# Patient Record
Sex: Male | Born: 1945 | Race: Black or African American | Hispanic: No | Marital: Married | Smoking: Former smoker
Health system: Southern US, Community
[De-identification: ages and names within clinical notes are randomized; demographics above are authoritative.]

## PROBLEM LIST (undated history)

## (undated) DIAGNOSIS — N400 Enlarged prostate without lower urinary tract symptoms: Secondary | ICD-10-CM

## (undated) DIAGNOSIS — N1831 Chronic kidney disease, stage 3a: Secondary | ICD-10-CM

## (undated) DIAGNOSIS — I639 Cerebral infarction, unspecified: Secondary | ICD-10-CM

## (undated) DIAGNOSIS — E119 Type 2 diabetes mellitus without complications: Secondary | ICD-10-CM

## (undated) DIAGNOSIS — N529 Male erectile dysfunction, unspecified: Secondary | ICD-10-CM

## (undated) DIAGNOSIS — E785 Hyperlipidemia, unspecified: Secondary | ICD-10-CM

## (undated) DIAGNOSIS — K625 Hemorrhage of anus and rectum: Secondary | ICD-10-CM

## (undated) DIAGNOSIS — C61 Malignant neoplasm of prostate: Secondary | ICD-10-CM

## (undated) DIAGNOSIS — I10 Essential (primary) hypertension: Secondary | ICD-10-CM

## (undated) DIAGNOSIS — I2510 Atherosclerotic heart disease of native coronary artery without angina pectoris: Secondary | ICD-10-CM

## (undated) DIAGNOSIS — Z8719 Personal history of other diseases of the digestive system: Secondary | ICD-10-CM

## (undated) DIAGNOSIS — K219 Gastro-esophageal reflux disease without esophagitis: Secondary | ICD-10-CM

## (undated) DIAGNOSIS — F1011 Alcohol abuse, in remission: Secondary | ICD-10-CM

## (undated) DIAGNOSIS — M1990 Unspecified osteoarthritis, unspecified site: Secondary | ICD-10-CM

## (undated) HISTORY — DX: Malignant neoplasm of prostate: C61

## (undated) HISTORY — PX: CERVICAL DISC SURGERY: SHX588

## (undated) HISTORY — DX: Alcohol abuse, in remission: F10.11

## (undated) HISTORY — DX: Gastro-esophageal reflux disease without esophagitis: K21.9

---

## 1979-06-15 HISTORY — PX: LACERATION REPAIR: SHX5168

## 1979-06-15 HISTORY — PX: FOOT SURGERY: SHX648

## 1994-10-14 HISTORY — PX: CORONARY ARTERY BYPASS GRAFT: SHX141

## 1998-07-04 ENCOUNTER — Ambulatory Visit (HOSPITAL_COMMUNITY): Admission: RE | Admit: 1998-07-04 | Discharge: 1998-07-04 | Payer: Self-pay | Admitting: Cardiology

## 1999-10-17 ENCOUNTER — Emergency Department (HOSPITAL_COMMUNITY): Admission: EM | Admit: 1999-10-17 | Discharge: 1999-10-17 | Payer: Self-pay | Admitting: Emergency Medicine

## 1999-10-18 ENCOUNTER — Encounter: Payer: Self-pay | Admitting: Emergency Medicine

## 2000-01-26 ENCOUNTER — Inpatient Hospital Stay (HOSPITAL_COMMUNITY): Admission: EM | Admit: 2000-01-26 | Discharge: 2000-01-29 | Payer: Self-pay | Admitting: Emergency Medicine

## 2000-01-26 ENCOUNTER — Encounter: Payer: Self-pay | Admitting: Emergency Medicine

## 2002-10-13 ENCOUNTER — Ambulatory Visit (HOSPITAL_COMMUNITY): Admission: RE | Admit: 2002-10-13 | Discharge: 2002-10-13 | Payer: Self-pay | Admitting: Gastroenterology

## 2003-04-02 ENCOUNTER — Emergency Department (HOSPITAL_COMMUNITY): Admission: EM | Admit: 2003-04-02 | Discharge: 2003-04-02 | Payer: Self-pay | Admitting: Emergency Medicine

## 2003-04-02 ENCOUNTER — Encounter: Payer: Self-pay | Admitting: Emergency Medicine

## 2003-11-07 ENCOUNTER — Inpatient Hospital Stay (HOSPITAL_COMMUNITY): Admission: EM | Admit: 2003-11-07 | Discharge: 2003-11-08 | Payer: Self-pay | Admitting: Emergency Medicine

## 2004-11-23 ENCOUNTER — Observation Stay (HOSPITAL_COMMUNITY): Admission: EM | Admit: 2004-11-23 | Discharge: 2004-11-23 | Payer: Self-pay | Admitting: Emergency Medicine

## 2005-01-04 ENCOUNTER — Emergency Department (HOSPITAL_COMMUNITY): Admission: EM | Admit: 2005-01-04 | Discharge: 2005-01-04 | Payer: Self-pay | Admitting: Family Medicine

## 2005-01-06 ENCOUNTER — Emergency Department (HOSPITAL_COMMUNITY): Admission: EM | Admit: 2005-01-06 | Discharge: 2005-01-06 | Payer: Self-pay | Admitting: Emergency Medicine

## 2005-01-11 ENCOUNTER — Emergency Department (HOSPITAL_COMMUNITY): Admission: EM | Admit: 2005-01-11 | Discharge: 2005-01-11 | Payer: Self-pay | Admitting: Emergency Medicine

## 2005-02-04 ENCOUNTER — Inpatient Hospital Stay (HOSPITAL_COMMUNITY): Admission: RE | Admit: 2005-02-04 | Discharge: 2005-02-05 | Payer: Self-pay | Admitting: Neurological Surgery

## 2005-04-09 ENCOUNTER — Ambulatory Visit (HOSPITAL_COMMUNITY): Admission: RE | Admit: 2005-04-09 | Discharge: 2005-04-09 | Payer: Self-pay | Admitting: Neurological Surgery

## 2005-04-17 ENCOUNTER — Emergency Department (HOSPITAL_COMMUNITY): Admission: EM | Admit: 2005-04-17 | Discharge: 2005-04-17 | Payer: Self-pay | Admitting: Emergency Medicine

## 2005-04-24 ENCOUNTER — Emergency Department (HOSPITAL_COMMUNITY): Admission: EM | Admit: 2005-04-24 | Discharge: 2005-04-24 | Payer: Self-pay | Admitting: Emergency Medicine

## 2005-10-07 ENCOUNTER — Inpatient Hospital Stay (HOSPITAL_COMMUNITY): Admission: EM | Admit: 2005-10-07 | Discharge: 2005-10-10 | Payer: Self-pay | Admitting: Emergency Medicine

## 2006-02-12 ENCOUNTER — Encounter: Payer: Self-pay | Admitting: Cardiology

## 2006-10-29 ENCOUNTER — Emergency Department (HOSPITAL_COMMUNITY): Admission: EM | Admit: 2006-10-29 | Discharge: 2006-10-29 | Payer: Self-pay | Admitting: Emergency Medicine

## 2007-02-18 ENCOUNTER — Emergency Department (HOSPITAL_COMMUNITY): Admission: EM | Admit: 2007-02-18 | Discharge: 2007-02-18 | Payer: Self-pay | Admitting: Emergency Medicine

## 2007-05-25 ENCOUNTER — Emergency Department (HOSPITAL_COMMUNITY): Admission: EM | Admit: 2007-05-25 | Discharge: 2007-05-25 | Payer: Self-pay | Admitting: Emergency Medicine

## 2007-06-09 ENCOUNTER — Encounter: Admission: RE | Admit: 2007-06-09 | Discharge: 2007-06-09 | Payer: Self-pay | Admitting: Internal Medicine

## 2007-10-03 ENCOUNTER — Emergency Department (HOSPITAL_COMMUNITY): Admission: EM | Admit: 2007-10-03 | Discharge: 2007-10-03 | Payer: Self-pay | Admitting: Emergency Medicine

## 2007-11-29 ENCOUNTER — Emergency Department (HOSPITAL_COMMUNITY): Admission: EM | Admit: 2007-11-29 | Discharge: 2007-11-29 | Payer: Self-pay | Admitting: Emergency Medicine

## 2008-10-03 ENCOUNTER — Ambulatory Visit (HOSPITAL_COMMUNITY): Admission: RE | Admit: 2008-10-03 | Discharge: 2008-10-03 | Payer: Self-pay | Admitting: Urology

## 2008-10-14 LAB — HM COLONOSCOPY

## 2008-10-19 ENCOUNTER — Ambulatory Visit: Admission: RE | Admit: 2008-10-19 | Discharge: 2009-01-17 | Payer: Self-pay | Admitting: Radiation Oncology

## 2009-01-18 ENCOUNTER — Ambulatory Visit: Admission: RE | Admit: 2009-01-18 | Discharge: 2009-04-07 | Payer: Self-pay | Admitting: Radiation Oncology

## 2009-01-26 ENCOUNTER — Ambulatory Visit: Payer: Self-pay | Admitting: Cardiology

## 2009-01-27 ENCOUNTER — Observation Stay (HOSPITAL_COMMUNITY): Admission: EM | Admit: 2009-01-27 | Discharge: 2009-01-27 | Payer: Self-pay | Admitting: Emergency Medicine

## 2009-01-27 HISTORY — PX: CARDIAC CATHETERIZATION: SHX172

## 2009-04-10 ENCOUNTER — Emergency Department (HOSPITAL_COMMUNITY): Admission: EM | Admit: 2009-04-10 | Discharge: 2009-04-10 | Payer: Self-pay | Admitting: Emergency Medicine

## 2009-10-08 ENCOUNTER — Emergency Department (HOSPITAL_COMMUNITY): Admission: EM | Admit: 2009-10-08 | Discharge: 2009-10-08 | Payer: Self-pay | Admitting: Emergency Medicine

## 2009-10-28 ENCOUNTER — Emergency Department (HOSPITAL_COMMUNITY): Admission: EM | Admit: 2009-10-28 | Discharge: 2009-10-28 | Payer: Self-pay | Admitting: Emergency Medicine

## 2009-11-06 ENCOUNTER — Ambulatory Visit (HOSPITAL_BASED_OUTPATIENT_CLINIC_OR_DEPARTMENT_OTHER): Admission: RE | Admit: 2009-11-06 | Discharge: 2009-11-06 | Payer: Self-pay | Admitting: Urology

## 2010-01-30 ENCOUNTER — Observation Stay (HOSPITAL_COMMUNITY): Admission: EM | Admit: 2010-01-30 | Discharge: 2010-01-31 | Payer: Self-pay | Admitting: Emergency Medicine

## 2010-05-16 ENCOUNTER — Emergency Department (HOSPITAL_COMMUNITY): Admission: EM | Admit: 2010-05-16 | Discharge: 2010-05-16 | Payer: Self-pay | Admitting: Emergency Medicine

## 2010-05-16 ENCOUNTER — Ambulatory Visit: Payer: Self-pay | Admitting: Cardiology

## 2010-05-20 ENCOUNTER — Emergency Department (HOSPITAL_COMMUNITY): Admission: EM | Admit: 2010-05-20 | Discharge: 2010-05-20 | Payer: Self-pay | Admitting: Internal Medicine

## 2010-11-23 ENCOUNTER — Other Ambulatory Visit: Payer: Self-pay | Admitting: Cardiology

## 2010-11-23 ENCOUNTER — Ambulatory Visit (INDEPENDENT_AMBULATORY_CARE_PROVIDER_SITE_OTHER): Payer: BC Managed Care – PPO | Admitting: Cardiology

## 2010-11-23 DIAGNOSIS — E119 Type 2 diabetes mellitus without complications: Secondary | ICD-10-CM

## 2010-11-23 DIAGNOSIS — I2510 Atherosclerotic heart disease of native coronary artery without angina pectoris: Secondary | ICD-10-CM

## 2010-11-23 DIAGNOSIS — R911 Solitary pulmonary nodule: Secondary | ICD-10-CM

## 2010-11-23 DIAGNOSIS — I10 Essential (primary) hypertension: Secondary | ICD-10-CM

## 2010-11-23 DIAGNOSIS — R918 Other nonspecific abnormal finding of lung field: Secondary | ICD-10-CM

## 2010-11-27 ENCOUNTER — Other Ambulatory Visit: Payer: BC Managed Care – PPO

## 2010-11-27 ENCOUNTER — Ambulatory Visit
Admission: RE | Admit: 2010-11-27 | Discharge: 2010-11-27 | Disposition: A | Discharge status: Home / Self Care | Payer: BC Managed Care – PPO | Source: Ambulatory Visit | Attending: Cardiology | Admitting: Cardiology

## 2010-11-27 DIAGNOSIS — R918 Other nonspecific abnormal finding of lung field: Secondary | ICD-10-CM

## 2010-12-17 ENCOUNTER — Other Ambulatory Visit: Payer: BC Managed Care – PPO

## 2010-12-17 ENCOUNTER — Other Ambulatory Visit: Payer: Self-pay | Admitting: Endocrinology

## 2010-12-17 ENCOUNTER — Ambulatory Visit (INDEPENDENT_AMBULATORY_CARE_PROVIDER_SITE_OTHER): Payer: BC Managed Care – PPO | Admitting: Endocrinology

## 2010-12-17 ENCOUNTER — Encounter: Payer: Self-pay | Admitting: Endocrinology

## 2010-12-17 DIAGNOSIS — F1011 Alcohol abuse, in remission: Secondary | ICD-10-CM | POA: Insufficient documentation

## 2010-12-17 DIAGNOSIS — E1159 Type 2 diabetes mellitus with other circulatory complications: Secondary | ICD-10-CM | POA: Insufficient documentation

## 2010-12-17 DIAGNOSIS — E7800 Pure hypercholesterolemia, unspecified: Secondary | ICD-10-CM

## 2010-12-17 DIAGNOSIS — E119 Type 2 diabetes mellitus without complications: Secondary | ICD-10-CM

## 2010-12-17 DIAGNOSIS — N401 Enlarged prostate with lower urinary tract symptoms: Secondary | ICD-10-CM | POA: Insufficient documentation

## 2010-12-17 DIAGNOSIS — Z79899 Other long term (current) drug therapy: Secondary | ICD-10-CM

## 2010-12-17 DIAGNOSIS — N400 Enlarged prostate without lower urinary tract symptoms: Secondary | ICD-10-CM | POA: Diagnosis present

## 2010-12-17 DIAGNOSIS — I10 Essential (primary) hypertension: Secondary | ICD-10-CM | POA: Insufficient documentation

## 2010-12-17 DIAGNOSIS — E1169 Type 2 diabetes mellitus with other specified complication: Secondary | ICD-10-CM | POA: Insufficient documentation

## 2010-12-17 DIAGNOSIS — E785 Hyperlipidemia, unspecified: Secondary | ICD-10-CM | POA: Insufficient documentation

## 2010-12-17 LAB — URINALYSIS, ROUTINE W REFLEX MICROSCOPIC
Bilirubin Urine: NEGATIVE
Hgb urine dipstick: NEGATIVE
Leukocytes, UA: NEGATIVE
Nitrite: NEGATIVE
Urobilinogen, UA: 0.2 (ref 0.0–1.0)

## 2010-12-17 LAB — LIPID PANEL
LDL Cholesterol: 52 mg/dL (ref 0–99)
Total CHOL/HDL Ratio: 4
Triglycerides: 164 mg/dL — ABNORMAL HIGH (ref 0.0–149.0)

## 2010-12-17 LAB — BASIC METABOLIC PANEL
Calcium: 8.8 mg/dL (ref 8.4–10.5)
Chloride: 101 mEq/L (ref 96–112)
Creatinine, Ser: 1.4 mg/dL (ref 0.4–1.5)
Sodium: 138 mEq/L (ref 135–145)

## 2010-12-17 LAB — HEPATIC FUNCTION PANEL
ALT: 17 U/L (ref 0–53)
Bilirubin, Direct: 0.1 mg/dL (ref 0.0–0.3)
Total Bilirubin: 0.2 mg/dL — ABNORMAL LOW (ref 0.3–1.2)

## 2010-12-17 LAB — MICROALBUMIN / CREATININE URINE RATIO
Microalb Creat Ratio: 2.9 mg/g (ref 0.0–30.0)
Microalb, Ur: 3.1 mg/dL — ABNORMAL HIGH (ref 0.0–1.9)

## 2010-12-17 LAB — HEMOGLOBIN A1C: Hgb A1c MFr Bld: 8.2 % — ABNORMAL HIGH (ref 4.6–6.5)

## 2010-12-25 NOTE — Assessment & Plan Note (Signed)
Summary: NEW ENDO CONSULT/ BCBS / SELF REFERRAL / DM / PCP IS DR. Nadara Eaton...   Vital Signs:  Patient profile:   65 year old male Height:      67.5 inches (171.45 cm) Weight:      192.50 pounds (87.50 kg) BMI:     29.81 O2 Sat:      94 % on Room air Temp:     98.9 degrees F (37.17 degrees C) oral Pulse rate:   71 / minute Pulse rhythm:   regular BP sitting:   118 / 86  (left arm) Cuff size:   large  Vitals Entered By: Brenton Grills CMA Duncan Dull) (December 17, 2010 2:59 PM)  O2 Flow:  Room air CC: New Endo Consult/DM/self-referral Comments Pt is due for tetanus and has never had Zostavax or Pneumovax   Referring Provider:  self Primary Provider:  Billee Cashing MD  CC:  New Endo Consult/DM/self-referral.  History of Present Illness: pt states was dx'ed with dm in 2011, when he presented for urologic surgery.  he is unaware of any chronic complications, but he had cabg in the 1990's.   it is complicated by .  he has never , except in the hospital been on insulin.   he takes 2 oral meds (he stopped actos due to negative media reports).  pt states cbg's are mostly in the high-100's.   pt says his diet and exercise are "not good."   symptomatically, pt states 1 month of moderate dryness of the mouth, and assoc polyuria.   Current Medications (verified): 1)  Metformin Hcl 500 Mg Xr24h-Tab (Metformin Hcl) .... 2 Tablets By Mouth Two Times A Day 2)  Finasteride 5 Mg Tabs (Finasteride) .Marland Kitchen.. 1 Tablet By Mouth Once Daily 3)  Metoprolol Tartrate 50 Mg Tabs (Metoprolol Tartrate) .Marland Kitchen.. 1 Tablet By Mouth Two Times A Day 4)  Accu-Chek Aviva  Strp (Glucose Blood) .... Test 1 To 2 Times A Day 5)  Glucotrol Xl 10 Mg Xr24h-Tab (Glipizide) .Marland Kitchen.. 1 Tablet By Mouth Once Daily 6)  Actos 45 Mg Tabs (Pioglitazone Hcl) .Marland Kitchen.. 1 Tablet By Mouth Once Daily  Allergies (verified): 1)  ! Demerol (Meperidine Hcl)  Past History:  Past Medical History: ALCOHOL ABUSE, IN REMISSION  (ICD-305.03) HYPERCHOLESTEROLEMIA (ICD-272.0) HYPERTENSION (ICD-401.9) BENIGN PROSTATIC HYPERTROPHY, WITH OBSTRUCTION (ICD-600.01) CAD (ICD-414.00) DM (ICD-250.00)  Family History: Reviewed history and no changes required. Family History Diabetes (2 sibs, 1 of whom is deceased) Family History Hypertension Family History Lung cancer Family History of Prostate CA 1st degree relative <50  Social History: Reviewed history and no changes required. 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 Smoking Status:  quit Drug Use:  no Does Patient Exercise:  yes Seat Belt Use:  yes  Review of Systems       The patient complains of weight gain.         denies blurry vision, headache, chest pain, sob, n/v, excessive diaphoresis, depression, hypoglycemia, rhinorrhea, and easy bruising.  he has muscle cramps and memory loss.     Physical Exam  General:  normal appearance.   Head:  head: no deformity eyes: no periorbital swelling, no proptosis external nose and ears are normal mouth: no lesion seen Neck:  Supple without thyroid enlargement or tenderness.  Lungs:  Clear to auscultation bilaterally. Normal respiratory effort.  Heart:  Regular rate and rhythm without murmurs or gallops noted. Normal S1,S2.   Abdomen:  abdomen is soft,  nontender.  no hepatosplenomegaly.   not distended.  no hernia  Msk:  muscle bulk and strength are grossly normal.  no obvious joint swelling.  gait is normal and steady  Pulses:  dorsalis pedis intact bilat.  no carotid bruit  Extremities:  no deformity.  no ulcer on the feet.  feet are of normal color and temp.  trace right pedal edema and trace left pedal edema.   Neurologic:  cn 2-12 grossly intact.   readily moves all 4's.   sensation is intact to touch on the feet  Skin:  normal texture and temp.  no rash.  not diaphoretic  Cervical Nodes:  No significant adenopathy.  Psych:   Alert and cooperative; normal mood and affect; normal attention span and concentration.   Additional Exam:   Hemoglobin A1C       [H]  8.2 %   LDL Cholesterol           52 mg/dL      Impression & Recommendations:  Problem # 1:  DM (ICD-250.00) needs increased rx  Problem # 2:  dryness of the mouth not related to #1  Problem # 3:  CAD (ICD-414.00) he needs to avoid hypoglycemia  Problem # 4:  weight gain this complicates the rx of #1  Problem # 5:  HYPERCHOLESTEROLEMIA (ICD-272.0) well-controlled  Medications Added to Medication List This Visit: 1)  Metformin Hcl 500 Mg Xr24h-tab (Metformin hcl) .... 2 tablets by mouth two times a day 2)  Finasteride 5 Mg Tabs (Finasteride) .Marland Kitchen.. 1 tablet by mouth once daily 3)  Metoprolol Tartrate 50 Mg Tabs (Metoprolol tartrate) .Marland Kitchen.. 1 tablet by mouth two times a day 4)  Accu-chek Aviva Strp (Glucose blood) .... Test 1 to 2 times a day 5)  Glucotrol Xl 10 Mg Xr24h-tab (Glipizide) .Marland Kitchen.. 1 tablet by mouth once daily 6)  Actos 45 Mg Tabs (Pioglitazone hcl) .Marland Kitchen.. 1 tablet by mouth once daily  Other Orders: TLB-Lipid Panel (80061-LIPID) TLB-BMP (Basic Metabolic Panel-BMET) (80048-METABOL) TLB-Hepatic/Liver Function Pnl (80076-HEPATIC) TLB-TSH (Thyroid Stimulating Hormone) (84443-TSH) TLB-A1C / Hgb A1C (Glycohemoglobin) (83036-A1C) TLB-Microalbumin/Creat Ratio, Urine (82043-MALB) TLB-Udip w/ Micro (81001-URINE) New Patient Level IV (38756)  Patient Instructions: 1)  good diet and exercise habits significanly improve the control of your diabetes.  please let me know if you wish to be referred to a dietician.  high blood sugar is very risky to your health.  you should see an eye doctor every year. 2)  controlling your blood pressure and cholesterol drastically reduces the damage diabetes does to your body.  this also applies to quitting smoking.  please discuss these with your doctor.  you should take an aspirin every day, unless you have been advised  by a doctor not to. 3)  check your blood sugar 1 time a day.  vary the time of day when you check, between before the 3 meals, and at bedtime.  also check if you have symptoms of your blood sugar being too high or too low.  please keep a record of the readings and bring it to your next appointment here.  please call us sooner if you are having low blood sugar episodes. 4)  blood tests are being ordered for you today.  please call [phone] to hear your test results. 5)  cc drs dahlstedt, [place] 6)  Please schedule a follow-up appointment in 3 months. 7)  (update: i left message on phone-tree:  you should resume actos 45 mg once daily.  call if  you want to take a different med instead).   Orders Added: 1)  TLB-Lipid Panel [80061-LIPID] 2)  TLB-BMP (Basic Metabolic Panel-BMET) [80048-METABOL] 3)  TLB-Hepatic/Liver Function Pnl [80076-HEPATIC] 4)  TLB-TSH (Thyroid Stimulating Hormone) [84443-TSH] 5)  TLB-A1C / Hgb A1C (Glycohemoglobin) [83036-A1C] 6)  TLB-Microalbumin/Creat Ratio, Urine [82043-MALB] 7)  TLB-Udip w/ Micro [81001-URINE] 8)  New Patient Level IV [09811]   Immunization History:  Influenza Immunization History:    Influenza:  historical (08/14/2010)   Immunization History:  Influenza Immunization History:    Influenza:  Historical (08/14/2010)  Preventive Care Screening  Colonoscopy:    Date:  10/14/2008    Results:  done

## 2010-12-28 LAB — DIFFERENTIAL
Basophils Absolute: 0 10*3/uL (ref 0.0–0.1)
Basophils Relative: 0 % (ref 0–1)
Basophils Relative: 1 % (ref 0–1)
Eosinophils Absolute: 0.1 10*3/uL (ref 0.0–0.7)
Lymphs Abs: 1.6 10*3/uL (ref 0.7–4.0)
Monocytes Absolute: 0.3 10*3/uL (ref 0.1–1.0)
Monocytes Absolute: 0.4 10*3/uL (ref 0.1–1.0)
Monocytes Relative: 6 % (ref 3–12)
Neutro Abs: 2.4 10*3/uL (ref 1.7–7.7)
Neutrophils Relative %: 55 % (ref 43–77)

## 2010-12-28 LAB — COMPREHENSIVE METABOLIC PANEL
ALT: 26 U/L (ref 0–53)
Albumin: 4 g/dL (ref 3.5–5.2)
Alkaline Phosphatase: 80 U/L (ref 39–117)
Calcium: 9.5 mg/dL (ref 8.4–10.5)
GFR calc Af Amer: >60 mL/min (ref >60)
Potassium: 4.7 mEq/L (ref 3.5–5.1)
Sodium: 137 mEq/L (ref 135–145)
Total Protein: 7.2 g/dL (ref 6.0–8.3)

## 2010-12-28 LAB — CBC
HCT: 35.4 % — ABNORMAL LOW (ref 39.0–52.0)
HCT: 40.4 % (ref 39.0–52.0)
MCHC: 32.8 g/dL (ref 30.0–36.0)
Platelets: 147 10*3/uL — ABNORMAL LOW (ref 150–400)
RDW: 16.4 % — ABNORMAL HIGH (ref 11.5–15.5)
RDW: 16.8 % — ABNORMAL HIGH (ref 11.5–15.5)
WBC: 5.7 10*3/uL (ref 4.0–10.5)

## 2010-12-28 LAB — POCT CARDIAC MARKERS
CKMB, poc: <1 ng/mL — ABNORMAL LOW (ref 1.0–8.0)
CKMB, poc: <1 ng/mL — ABNORMAL LOW (ref 1.0–8.0)
CKMB, poc: <1 ng/mL — ABNORMAL LOW (ref 1.0–8.0)
Myoglobin, poc: 41.7 ng/mL (ref 12–200)
Myoglobin, poc: 67.5 ng/mL (ref 12–200)

## 2010-12-28 LAB — BASIC METABOLIC PANEL
BUN: 13 mg/dL (ref 6–23)
Calcium: 8.6 mg/dL (ref 8.4–10.5)
GFR calc non Af Amer: 56 mL/min — ABNORMAL LOW (ref >60)
Glucose, Bld: 126 mg/dL — ABNORMAL HIGH (ref 70–99)

## 2010-12-28 LAB — URINALYSIS, ROUTINE W REFLEX MICROSCOPIC
Hgb urine dipstick: NEGATIVE
Nitrite: NEGATIVE
Specific Gravity, Urine: 1.018 (ref 1.005–1.030)
Urobilinogen, UA: 0.2 mg/dL (ref 0.0–1.0)

## 2010-12-28 LAB — PROTIME-INR: INR: 0.92 (ref 0.00–1.49)

## 2010-12-28 LAB — HEMOCCULT GUIAC POC 1CARD (OFFICE): Fecal Occult Bld: NEGATIVE

## 2010-12-30 LAB — URINALYSIS, ROUTINE W REFLEX MICROSCOPIC
Protein, ur: >300 mg/dL — AB
Specific Gravity, Urine: 1.027 (ref 1.005–1.030)
Urobilinogen, UA: 1 mg/dL (ref 0.0–1.0)

## 2010-12-30 LAB — DIFFERENTIAL
Eosinophils Absolute: 0.1 10*3/uL (ref 0.0–0.7)
Eosinophils Relative: 1 % (ref 0–5)
Lymphocytes Relative: 13 % (ref 12–46)
Lymphs Abs: 0.8 10*3/uL (ref 0.7–4.0)
Monocytes Absolute: 0.2 10*3/uL (ref 0.1–1.0)
Monocytes Relative: 4 % (ref 3–12)

## 2010-12-30 LAB — POCT I-STAT, CHEM 8
BUN: 28 mg/dL — ABNORMAL HIGH (ref 6–23)
Creatinine, Ser: 1.7 mg/dL — ABNORMAL HIGH (ref 0.4–1.5)
Glucose, Bld: 380 mg/dL — ABNORMAL HIGH (ref 70–99)
Hemoglobin: 13.9 g/dL (ref 13.0–17.0)
TCO2: 23 mmol/L (ref 0–100)

## 2010-12-30 LAB — POCT I-STAT 4, (NA,K, GLUC, HGB,HCT)
Glucose, Bld: 331 mg/dL — ABNORMAL HIGH (ref 70–99)
HCT: 39 % (ref 39.0–52.0)
Potassium: 3.9 mEq/L (ref 3.5–5.1)

## 2010-12-30 LAB — GLUCOSE, CAPILLARY
Glucose-Capillary: 365 mg/dL — ABNORMAL HIGH (ref 70–99)
Glucose-Capillary: 232 mg/dL — ABNORMAL HIGH (ref 70–99)

## 2010-12-30 LAB — CBC
HCT: 39.3 % (ref 39.0–52.0)
Hemoglobin: 13.5 g/dL (ref 13.0–17.0)
MCV: 87 fL (ref 78.0–100.0)
RBC: 4.52 MIL/uL (ref 4.22–5.81)
WBC: 6.2 10*3/uL (ref 4.0–10.5)

## 2010-12-30 LAB — URINE MICROSCOPIC-ADD ON

## 2011-01-01 LAB — URINE CULTURE
Colony Count: NO GROWTH
Culture: NO GROWTH

## 2011-01-01 LAB — BASIC METABOLIC PANEL
BUN: 20 mg/dL (ref 6–23)
CO2: 22 mEq/L (ref 19–32)
CO2: 25 mEq/L (ref 19–32)
Calcium: 9.1 mg/dL (ref 8.4–10.5)
Chloride: 107 mEq/L (ref 96–112)
Chloride: 110 mEq/L (ref 96–112)
Creatinine, Ser: 1.79 mg/dL — ABNORMAL HIGH (ref 0.4–1.5)
GFR calc Af Amer: 47 mL/min — ABNORMAL LOW (ref >60)
GFR calc Af Amer: >60 mL/min (ref >60)
GFR calc non Af Amer: 39 mL/min — ABNORMAL LOW (ref >60)
Glucose, Bld: 131 mg/dL — ABNORMAL HIGH (ref 70–99)
Potassium: 4 mEq/L (ref 3.5–5.1)
Potassium: 4.1 mEq/L (ref 3.5–5.1)
Sodium: 139 mEq/L (ref 135–145)
Sodium: 139 mEq/L (ref 135–145)

## 2011-01-01 LAB — CBC
HCT: 33.5 % — ABNORMAL LOW (ref 39.0–52.0)
Hemoglobin: 11.6 g/dL — ABNORMAL LOW (ref 13.0–17.0)
MCHC: 33.5 g/dL (ref 30.0–36.0)
MCHC: 34.4 g/dL (ref 30.0–36.0)
MCV: 88.6 fL (ref 78.0–100.0)
MCV: 90.2 fL (ref 78.0–100.0)
Platelets: 146 10*3/uL — ABNORMAL LOW (ref 150–400)
RBC: 3.79 MIL/uL — ABNORMAL LOW (ref 4.22–5.81)
RBC: 3.91 MIL/uL — ABNORMAL LOW (ref 4.22–5.81)
WBC: 4.5 10*3/uL (ref 4.0–10.5)

## 2011-01-01 LAB — URINALYSIS, ROUTINE W REFLEX MICROSCOPIC
Glucose, UA: NEGATIVE mg/dL
Ketones, ur: NEGATIVE mg/dL
Nitrite: NEGATIVE
Protein, ur: NEGATIVE mg/dL
Urobilinogen, UA: 0.2 mg/dL (ref 0.0–1.0)

## 2011-01-01 LAB — IRON AND TIBC
Iron: 39 ug/dL — ABNORMAL LOW (ref 42–135)
Saturation Ratios: 13 % — ABNORMAL LOW (ref 20–55)
TIBC: 311 ug/dL (ref 215–435)

## 2011-01-01 LAB — DIFFERENTIAL
Basophils Absolute: 0 10*3/uL (ref 0.0–0.1)
Basophils Relative: 0 % (ref 0–1)
Eosinophils Absolute: 0.2 10*3/uL (ref 0.0–0.7)
Eosinophils Relative: 5 % (ref 0–5)
Lymphocytes Relative: 24 % (ref 12–46)
Lymphs Abs: 1.1 10*3/uL (ref 0.7–4.0)
Monocytes Absolute: 0.3 10*3/uL (ref 0.1–1.0)
Monocytes Relative: 6 % (ref 3–12)
Neutro Abs: 2.9 10*3/uL (ref 1.7–7.7)
Neutrophils Relative %: 64 % (ref 43–77)

## 2011-01-01 LAB — TYPE AND SCREEN
ABO/RH(D): AB POS
Antibody Screen: NEGATIVE

## 2011-01-01 LAB — RETICULOCYTES: Retic Count, Absolute: 52 10*3/uL (ref 19.0–186.0)

## 2011-01-01 LAB — CREATININE, URINE, RANDOM: Creatinine, Urine: 96 mg/dL

## 2011-01-01 LAB — SODIUM, URINE, RANDOM: Sodium, Ur: 215 mEq/L

## 2011-01-01 LAB — PROTIME-INR
INR: 1.1 (ref 0.00–1.49)
Prothrombin Time: 14.1 seconds (ref 11.6–15.2)

## 2011-01-01 LAB — HEMOGLOBIN AND HEMATOCRIT, BLOOD
HCT: 34.4 % — ABNORMAL LOW (ref 39.0–52.0)
Hemoglobin: 11.9 g/dL — ABNORMAL LOW (ref 13.0–17.0)

## 2011-01-01 LAB — FERRITIN: Ferritin: 27 ng/mL (ref 22–322)

## 2011-01-01 LAB — ABO/RH: ABO/RH(D): AB POS

## 2011-01-01 LAB — HEMOCCULT GUIAC POC 1CARD (OFFICE): Fecal Occult Bld: POSITIVE

## 2011-01-14 LAB — CBC
HCT: 42.9 % (ref 39.0–52.0)
Hemoglobin: 14.5 g/dL (ref 13.0–17.0)
MCHC: 33.7 g/dL (ref 30.0–36.0)
RDW: 15.7 % — ABNORMAL HIGH (ref 11.5–15.5)

## 2011-01-14 LAB — BASIC METABOLIC PANEL
CO2: 29 mEq/L (ref 19–32)
Calcium: 9.2 mg/dL (ref 8.4–10.5)
Glucose, Bld: 213 mg/dL — ABNORMAL HIGH (ref 70–99)
Sodium: 137 mEq/L (ref 135–145)

## 2011-01-14 LAB — DIFFERENTIAL
Basophils Absolute: 0 10*3/uL (ref 0.0–0.1)
Basophils Relative: 0 % (ref 0–1)
Eosinophils Absolute: 0.2 10*3/uL (ref 0.0–0.7)
Eosinophils Relative: 4 % (ref 0–5)
Monocytes Absolute: 0.5 10*3/uL (ref 0.1–1.0)

## 2011-01-14 LAB — URINALYSIS, ROUTINE W REFLEX MICROSCOPIC
Bilirubin Urine: NEGATIVE
Glucose, UA: NEGATIVE mg/dL
Ketones, ur: NEGATIVE mg/dL
Leukocytes, UA: NEGATIVE
pH: 6 (ref 5.0–8.0)

## 2011-01-14 LAB — URINE MICROSCOPIC-ADD ON

## 2011-01-14 LAB — URINE CULTURE: Colony Count: NO GROWTH

## 2011-01-21 LAB — URINALYSIS, ROUTINE W REFLEX MICROSCOPIC
Bilirubin Urine: NEGATIVE
Ketones, ur: 15 mg/dL — AB
Nitrite: NEGATIVE
pH: 7.5 (ref 5.0–8.0)

## 2011-01-21 LAB — POCT I-STAT, CHEM 8
Calcium, Ion: 1.13 mmol/L (ref 1.12–1.32)
Chloride: 105 mEq/L (ref 96–112)
HCT: 38 % — ABNORMAL LOW (ref 39.0–52.0)
Hemoglobin: 12.9 g/dL — ABNORMAL LOW (ref 13.0–17.0)
Potassium: 4.5 mEq/L (ref 3.5–5.1)

## 2011-01-21 LAB — POCT CARDIAC MARKERS
CKMB, poc: <1 ng/mL — ABNORMAL LOW (ref 1.0–8.0)
Troponin i, poc: <0.05 ng/mL (ref 0.00–0.09)

## 2011-01-21 LAB — BRAIN NATRIURETIC PEPTIDE: Pro B Natriuretic peptide (BNP): 40 pg/mL (ref 0.0–100.0)

## 2011-01-21 LAB — DIFFERENTIAL
Basophils Absolute: 0 10*3/uL (ref 0.0–0.1)
Lymphocytes Relative: 10 % — ABNORMAL LOW (ref 12–46)
Neutro Abs: 4.6 10*3/uL (ref 1.7–7.7)

## 2011-01-21 LAB — CBC
Platelets: 97 10*3/uL — ABNORMAL LOW (ref 150–400)
RDW: 15.1 % (ref 11.5–15.5)

## 2011-01-21 LAB — URINE MICROSCOPIC-ADD ON

## 2011-01-23 LAB — COMPREHENSIVE METABOLIC PANEL
Albumin: 3.3 g/dL — ABNORMAL LOW (ref 3.5–5.2)
BUN: 15 mg/dL (ref 6–23)
Calcium: 8.5 mg/dL (ref 8.4–10.5)
Creatinine, Ser: 1.23 mg/dL (ref 0.4–1.5)
Glucose, Bld: 121 mg/dL — ABNORMAL HIGH (ref 70–99)
Potassium: 3.6 mEq/L (ref 3.5–5.1)
Total Protein: 5.7 g/dL — ABNORMAL LOW (ref 6.0–8.3)

## 2011-01-23 LAB — POCT CARDIAC MARKERS

## 2011-01-23 LAB — CARDIAC PANEL(CRET KIN+CKTOT+MB+TROPI)
CK, MB: 1.5 ng/mL (ref 0.3–4.0)
CK, MB: 1.8 ng/mL (ref 0.3–4.0)
Relative Index: 1.2 (ref 0.0–2.5)
Relative Index: 1.3 (ref 0.0–2.5)
Total CK: 130 U/L (ref 7–232)
Total CK: 136 U/L (ref 7–232)
Troponin I: <0.01 ng/mL (ref 0.00–0.06)

## 2011-01-23 LAB — CBC
HCT: 40.6 % (ref 39.0–52.0)
Platelets: 139 10*3/uL — ABNORMAL LOW (ref 150–400)
RDW: 14.9 % (ref 11.5–15.5)
WBC: 5.6 10*3/uL (ref 4.0–10.5)

## 2011-01-23 LAB — CK TOTAL AND CKMB (NOT AT ARMC)
CK, MB: 2.3 ng/mL (ref 0.3–4.0)
Relative Index: 1.4 (ref 0.0–2.5)
Total CK: 162 U/L (ref 7–232)

## 2011-01-23 LAB — BASIC METABOLIC PANEL
BUN: 16 mg/dL (ref 6–23)
Calcium: 9.6 mg/dL (ref 8.4–10.5)
Creatinine, Ser: 1.39 mg/dL (ref 0.4–1.5)
GFR calc non Af Amer: 52 mL/min — ABNORMAL LOW (ref >60)
Glucose, Bld: 122 mg/dL — ABNORMAL HIGH (ref 70–99)
Potassium: 4.2 mEq/L (ref 3.5–5.1)

## 2011-01-23 LAB — PROTIME-INR
INR: 1 (ref 0.00–1.49)
Prothrombin Time: 12.9 seconds (ref 11.6–15.2)

## 2011-01-23 LAB — DIFFERENTIAL
Basophils Absolute: 0 10*3/uL (ref 0.0–0.1)
Eosinophils Absolute: 0.2 10*3/uL (ref 0.0–0.7)
Eosinophils Relative: 3 % (ref 0–5)
Lymphocytes Relative: 45 % (ref 12–46)
Lymphs Abs: 2.5 10*3/uL (ref 0.7–4.0)
Neutrophils Relative %: 44 % (ref 43–77)

## 2011-01-23 LAB — BRAIN NATRIURETIC PEPTIDE: Pro B Natriuretic peptide (BNP): <30 pg/mL (ref 0.0–100.0)

## 2011-01-23 LAB — LIPID PANEL
Cholesterol: 127 mg/dL (ref 0–200)
LDL Cholesterol: 80 mg/dL (ref 0–99)

## 2011-01-23 LAB — HEMOGLOBIN A1C
Hgb A1c MFr Bld: 7.8 % — ABNORMAL HIGH (ref 4.6–6.1)
Mean Plasma Glucose: 177 mg/dL

## 2011-01-23 LAB — TROPONIN I: Troponin I: <0.01 ng/mL (ref 0.00–0.06)

## 2011-01-23 LAB — TSH: TSH: 0.992 u[IU]/mL (ref 0.350–4.500)

## 2011-02-26 NOTE — Cardiovascular Report (Signed)
NAME:  Antonio [name], Antonio [name] NO.:  [id number]   MEDICAL RECORD NO.:  [id number]           PATIENT TYPE:   LOCATION:                                 FACILITY:   PHYSICIAN:  Peter M. [place], M.D.  DATE OF BIRTH:  April 22, 1946   DATE OF PROCEDURE:  DATE OF DISCHARGE:                            CARDIAC CATHETERIZATION   INDICATIONS FOR PROCEDURE:  A 65 year old black male status post  coronary artery bypass surgery in 1996.  He has had previous angioplasty  of the saphenous vein graft to the right coronary.  He presents with  chest pain at rest.   PROCEDURE:  Left heart catheterization, coronary and left ventricular  angiography, saphenous vein graft angiography x3, LIMA graft  angiography.  Access is via the right femoral artery using the standard  Seldinger technique.   EQUIPMENT USED:  A 6-French 4-cm right and left Judkins catheter, 6-  French pigtail catheter, 6-French LCB catheter, 6-French arterial  sheath.   MEDICATIONS:  Local anesthesia 1% Xylocaine.   CONTRAST:  Omnipaque 150 mL.   HEMODYNAMIC DATA:  Aortic pressure is 98/66 with a mean of 81 mmHg.  Left ventricle pressure is 102 with an EDP of 16 mmHg.   ANGIOGRAPHIC FINDINGS:  The left main coronary artery is normal.   Left anterior descending artery is occluded proximally after the first  septal perforator.   The left circumflex coronary has a 50% stenosis in the mid in the  proximal vessel.  There is moderate distal disease up to 50%.  The first  obtuse marginal vessel is occluded.  The second obtuse marginal has a  95% stenosis at its origin.   The right coronary artery is occluded in the proximal vessel.  There are  bridging collaterals to the mid and distal right coronary artery.   Saphenous vein graft to the distal right coronary artery/PDA is widely  patent.  There is a 30% narrowing in the distal saphenous vein graft.   Saphenous vein graft to the first diagonal branch is widely patent.   Saphenous vein graft to the first and second obtuse marginal vessels is  widely patent.   The LIMA graft to the LAD is widely patent.  The distal LAD as it wraps  around the apex shows a 50-60% stenosis.   Left ventricular angiography was performed in the RAO view.  This  demonstrates normal left ventricular size and contractility with overall  ejection fraction estimated 60%.  There is a diverticulum in the basal  inferior wall.  There is no significant mitral insufficiency.   FINAL IMPRESSION:  1. Severe three-vessel obstructive coronary artery disease.  2. All grafts are patent including left internal mammary artery graft      to the left anterior descending, saphenous vein graft to the      diagonal, saphenous vein graft sequentially to the first and second      obtuse marginal vessels, saphenous vein graft to the patent ductus      arteriosus.  3. Good left ventricular systolic function.   PLAN:  We would recommend continued medical therapy.  ______________________________  Peter M. [place], M.D.     PMJ/MEDQ  D:  01/27/2009  T:  01/28/2009  Job:  [id number]

## 2011-03-01 NOTE — H&P (Signed)
NAME:  Antonio [name], Antonio [name]                        ACCOUNT NO.:  [id number]   MEDICAL RECORD NO.:  [id number]                   PATIENT TYPE:  EMS   LOCATION:  [facility]                                 FACILITY:  [facility]   PHYSICIAN:  Peter M. [place], M.D.               DATE OF BIRTH:  1945/11/01   DATE OF ADMISSION:  11/07/2003  DATE OF DISCHARGE:                                HISTORY & PHYSICAL   HISTORY OF PRESENT ILLNESS:  Mr. Palka is a 65 year old black male who  presents to the emergency room today with a 3-day history of pain in his  left back and chest.  This pain is described as a periscapular pain  associated with left pectoral pain.  The pain is precipitated by movement of  his arm and shoulder.  He thinks he may have had a strain, however, he also  has character of indigestion-like pain in his anterior chest and at times  feels like his heart is hurting.  Currently, he is without chest pain.  He  has had a history of coronary artery disease, status post coronary artery  bypass surgery in 1996.  At that time, he had an LIMA graft placed to he  LAD, saphenous vein graft to intermediate and obtuse marginal branches, vein  graft to the PDA and posterolateral branches and vein graft to the diagonal.  In 1997, he had angioplasty of his left circumflex coronary artery and a  vein graft to the right coronary artery.  The angioplasty to the right  coronary artery restenosed and he had repeat angioplasty in 1997.  Followup  cardiac catheterization in 1998 showed old areas were open.  His last stress  Cardiolite study in 1999 showed inferior wall scar without ischemia.   PAST MEDICAL HISTORY:  1. ASCAD, as noted above.  2. Hyperlipidemia.  3. Hypertension.  4. Tobacco abuse.  5. Previous hand and left foot surgery related to injuries.   MEDICATIONS:  1. Lopressor 25 mg per day.  2. Aspirin daily.  3. Plavix 75 mg per day.  4. Zocor 80 mg per day.  5. Multivitamin daily.   ALLERGIES:  No known drug allergies.   SOCIAL HISTORY:  The patient works for [employer].  He is a smoker of 1/2 to  1 pack per day.  He is divorced and has 1 daughter.   FAMILY HISTORY:  He has 1 brother who has a history of angina.   REVIEW OF SYSTEMS:  The patient reports he has lost weight.  He is  exercising regularly.  He denies any claudication symptoms or edema.  All  other review of systems are negative.   PHYSICAL EXAMINATION:  GENERAL:  The patient is a pleasant, well-developed  black male in no apparent distress.  VITAL SIGNS:  Blood pressure is 120/74, pulse 74 and regular.  He is  afebrile.  HEENT:  Pupils equal, round and reactive.  Conjunctivae are clear.  Oropharynx is clear.  NECK:  Neck is supple without JVD, adenopathy, thyromegaly or bruits.  LUNGS:  Lungs are clear.  CARDIAC:  Exam reveals regular rate and rhythm without murmurs, rubs,  gallops or clicks.  He has no chest wall tenderness to palpation.  ABDOMEN:  Abdomen is soft and nontender and without hepatosplenomegaly,  masses or bruits.  EXTREMITIES:  Femoral and pedal pulses are 2+ and symmetric.  NEUROLOGIC:  Exam is intact.   LABORATORY DATA:  ECG shows normal sinus rhythm with subtle ST elevation in  leads III and aVF.  Compared to June of 2004, there is less T wave inversion  anterolaterally.  Sodium is 141, potassium 3.7, chloride 110, BUN is 16,  glucose is 111.  Hemoglobin 14.6.  Point-of-care cardiac enzymes are  negative x2.   IMPRESSION:  1. Atypical chest pain.  2. Arteriosclerotic coronary artery disease, status post coronary artery     bypass graft and subsequent multiple angioplasties.  3. Hypertension.  4. Hypercholesterolemia.  5. Tobacco abuse.   PLAN:  The patient will be admitted to telemetry.  We will rule out  myocardial infarction.  He will be treated with IV heparin and  nitroglycerin.  Counsel on smoking cessation.  Check his lipid status.  If  he does rule out for  myocardial infarction, we will plan on stress  Cardiolite study in the morning.  We will also treat him with ibuprofen for  his musculoskeletal pain and give him Protonix for acid reflux prevention.                                                Peter M. [place], M.D.    PMJ/MEDQ  D:  11/07/2003  T:  11/07/2003  Job:  [id number]

## 2011-03-01 NOTE — H&P (Signed)
Antonio [name], Antonio [name] NO.:  [id number]   MEDICAL RECORD NO.:  [id number]          PATIENT TYPE:  INP   LOCATION:  3715                         FACILITY:  [facility]   PHYSICIAN:  Cassell Clement, M.D. DATE OF BIRTH:  Jul 08, 1946   DATE OF ADMISSION:  10/07/2005  DATE OF DISCHARGE:                                HISTORY & PHYSICAL   CHIEF COMPLAINT:  Chest pain.   HISTORY:  This is a 65 year old married African-American gentleman who is  admitted with chest pain and left arm pain. The patient had onset of chest  discomfort at about 6:00 p.m. tonight while watching football on TV.  He  noted mild nausea, and, after taking a sublingual nitroglycerin at home he  got dizzy.  He did not get diaphoretic, but he did have some left arm  numbness.  He has not had any recent symptoms of exertional angina. He sees  Dr. [place] on a yearly basis.  His last stress Cardiolite was approximately  a year or a year- and-a-half ago at the hospital and showed no ischemia. He  has a history of having had coronary artery bypass graft surgery November 06, 1994 by Dr. Laneta Simmers.   HOME MEDICATIONS:  1.  Metoprolol 25 milligrams b.i.d.  2.  Benicar HCT 20/25 once a day.  3.  Zocor 80 milligrams daily.  4.  Aspirin 325 milligrams daily.  5.  Multivitamin daily.   FAMILY HISTORY:  Positive for cancer and he also has a brother with coronary  disease.   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  He  quit smoking in 1996. He does not drink alcohol. He is married, has one  daughter and one stepson.   PREVIOUS SURGERY:  1.  Coronary bypass surgery in 1996.  2.  He had cervical spine surgery by Dr. Danielle Dess in 2006.   ALLERGIES:  The patient has a history of allergy to DEMEROL.   REVIEW OF SYSTEMS:  GASTROINTESTINAL:  He uses Zantac p.r.n.  He has normal  bowel movements.  There has been no hematochezia or melena.  GENITOURINARY:  Reveals no dysuria.  He has nocturia  x1.  He is not diabetic. He has no  known thyroid problems.   PHYSICAL EXAMINATION:  VITAL SIGNS:  Blood pressure 110/74, pulses 80 and  regular, respirations are normal.  SKIN:  Warm and dry.  HEENT:  Negative. Carotids negative.  Jugular venous pressure is normal.  LUNGS:  Clear.  HEART:  Reveals no murmur, gallop, rub or click.  ABDOMEN:  Soft, nontender without hepatosplenomegaly or masses.  EXTREMITIES:  Show 1+ pedal pulses.  No edema.   EKG:  His electrocardiograms done at 1851 shows T-wave inversions in II, aVL  and V2 which had increased since April 17, 2005.  We repeated the EKG of 2239,  and the T-wave inversions have resolved partially.   LABORATORY STUDIES:  His initial CK-MB is 1.8, troponin less than 0.05 at  point of care.  His hemoglobin is 14.4, white count 4900.  Sodium 140,  potassium 4.1, BUN  of 18 and creatinine of 1.8, blood sugar 102.   IMPRESSION:  1.  Chest pain, rule out myocardial infarction.  2.  Ischemic heart disease with previous coronary artery bypass graft in      1996 following a myocardial infarction.  3.  History of hypertension.  4.  History of high cholesterol.  5.  Former smoker.  6.  Mild renal insufficiency.   DISPOSITION:  We are admitting to Dr. Elvis Coil service to telemetry. Will  get serial enzymes and EKGs.  Will give IV nitroglycerin and IV heparin.  Will continue beta blocker, ACE or __________ therapy, aspirin and statin  therapy. Will follow renal function closely. Anticipate possible cardiac  catheterization October 08, 2005 by Dr. Reyes Ivan in Dr. Elvis Coil absence.  Will give some IV saline tonight and recheck a BMET in the morning to be  sure his creatinine is okay for catheterization.           ______________________________  Cassell Clement, M.D.     TB/MEDQ  D:  10/07/2005  T:  10/08/2005  Job:  [id number]   cc:   Lorelle Formosa, M.D.  Fax: [phone]   Peter M. [place], M.D.  Fax: [phone]

## 2011-03-01 NOTE — H&P (Signed)
NAME:  Antonio [name], Antonio [name] NO.:  [id number]   MEDICAL RECORD NO.:  [id number]          PATIENT TYPE:  EMS   LOCATION:  MAJO                         FACILITY:  [facility]   PHYSICIAN:  [name], MD DATE OF BIRTH:  28-Feb-1946   DATE OF ADMISSION:  11/23/2004  DATE OF DISCHARGE:                                HISTORY & PHYSICAL   HISTORY OF PRESENT ILLNESS:  Antonio [name] is a 65 year old black man who is  admitted to [facility] for further evaluation of chest pain.  The  patient has a history of coronary artery disease which dates back to 53.  At that time, he underwent coronary artery bypass surgery.  He received a  left internal mammary artery graft to the LAD, a saphenous vein graft to the  intermediate and then obtuse marginal, a saphenous vein graft to the PDA and  then posterolateral branch and a saphenous vein graft to the diagonal.  In  1997, he underwent PTCA of the circumflex and of the saphenous vein graft to  the PDA.  His last cardiac catheterization was performed in January 2005.  This revealed a left ventriculogram with a small posterior pseudoaneurysm.  There was a small focal area of anterior dyskinesis with a preserved  ejection fraction of 50%.  The left main coronary artery bifurcated into the  LAD and circumflex.  There was significant disease in the left main coronary  artery.  The left anterior descending gave rise to a small septal perforated  and a small diagonal branch.  The first diagonal branch had a 60-70% long  lesion proximally.  The diagonal branch, itself, was approximately a 1 mm  sized vessel.  The circumflex was diffusely diseased.  The first and second  obtuse marginals were totally occluded.  There was a 50-60% proximal lesion.  The distal circumflex had a long 80-90% lesion.  The distal circumflex was  approximately 1.5 mm in size.  The native right coronary artery was also  diffusely diseased.  It gave rise to two  artery marginals and then was  totally occluded.  The left internal mammary artery graft to the LAD was  widely patent.  The distal LAD itself had a 60-70% lesion.  The saphenous  vein graft to the first obtuse marginal and second obtuse marginal remained  patent.  The saphenous vein graft to the diagonal was patent.  There was a  40% lesion just distal to the anastomotic site of the diagonal branch.  The  diagonal branch was a small caliber vessel.  The saphenous vein graft to the  PDA was patent.  The PDA itself was also a small caliber vessel.  There was  a 40% lesion and saphenous vein graft to the PDA.  The area of ischemia was  felt to be the first diagonal.  Medical treatment was recommended.  The  patient has been well since then.  He presented to the emergency department  during the night after experiencing an episode of chest pain.  This occurred  just as he was arriving to work last evening.  He  was sitting in the break  room.  The chest pain was described as a sharp discomfort in the left  inframammary region, left axilla, and along the left of the inside aspect of  his left arm.  Did not radiate elsewhere.  The discomfort was not associated  with dyspnea, diaphoresis or nausea.  There were no exacerbating or  ameliorating factors.  It seemed to be unrelated to position, activity,  meals or respiration.  It resolved when he presented to the emergency  department and was given nitroglycerin.  The total duration of chest pain  was approximately 4 hours.  He is uncertain as to whether or not this chest  pain was similar to his prior activity.  The patient is without chest pain  and otherwise asymptomatic at this time.   In addition to the cardiac disease as noted above, the patient has a history  of hypertension and dyslipidemia.  There was no history of diabetes  mellitus.  He stopped smoking approximately a year ago.  There was a family  history of early coronary artery disease  (brother).   The patient has no other medical problems.   MEDICATIONS:  1.  Lopressor.  2.  Zocor.  3.  Aspirin.   ALLERGIES:  DEMEROL.   OPERATIONS:  Left hand and left foot surgery related to injuries.   SOCIAL HISTORY:  The patient worked for [employer].  He does not smoke or  drink.  He lives with his wife.   FAMILY HISTORY:  Coronary artery disease in a brother as described above.   REVIEW OF SYSTEMS:  No new problems related to his Head, Eyes, Ears, Nose,  Mouth, Throat, Lungs, GI, GU, Extremities.  There is no history of  neurological psychiatric disorder.  No history of fever, chills or weight  loss.   PHYSICAL EXAMINATION:  VITAL SIGNS:  Blood pressure 109/74, pulse 71 and  regular, respirations 20, temperature 97.5.  GENERAL APPEARANCE:  The patient was a middle-aged black man in no  discomfort.  He was alert, oriented, appropriate and responsive.  HEENT:  Normal.  NECK:  Without thyromegaly or adenopathy.  Carotid pulses were palpable  bilaterally and without bruits.  CARDIAC:  Normal S1, S2.  No S3, S4, murmur, rub or click.  Rhythm was  regular.  No chest pain tenderness was noted.  LUNGS:  Clear.  ABDOMEN:  Soft and tenderness.  No mass, hepatosplenomegaly, bruit,  distention, rebound, guarding or rigidity.  Bowel sounds were normal.  RECTAL/GENITAL:  Not performed as they were not pertinent for reason for  acute care hospitalization.  EXTREMITIES:  Without edema, deviation or deformity.  Radial and dorsalis  pedis pulses were palpable bilaterally.  NEUROLOGICAL:  Prescreening neurologic survey was unremarkable.   STUDIES:  Electrocardiogram revealed normal sinus rhythm.  T wave inversion  was present in the anterolateral leads.  This could possibly been due to  left ventricular hypertrophy, but the possibility of ischemia could not be excluded.  The chest radiography, according to the radiologist, demonstrated  no evidence of acute disease.  The initial set  of cardiac markers revealed a  CK MB of less than 1.0, myoglobin 76.5 and troponin less than 0.05.  The  second set revealed a CK MB of less than 1.0, myoglobin 45.3, troponin less  than 0.05.  The third set revealed CK MB less than 1.0, myoglobin 52.5,  troponin less than 0.05.  The remaining studies were pending at the time of  this  dictation.   IMPRESSION:  1.  Chest pain, rule out cardiac ischemia.  The patient experienced a four      hour episode of sharp chest discomfort located in the left inframammary      region, left axilla and inner aspect of left upper arm.  2.  Coronary artery disease status post coronary artery bypass surgery in      1996, status post PTCA's in 1997.  Last cardiac catheterization in      January 2005.  Details of all of the above procedures are included in      the narrative above.  3.  Hypertension.  4.  Dyslipidemia.   PLAN:  1.  Telemetry.  2.  Serial cardiac enzymes.  3.  Aspirin.  4.  Intravenous heparin.  5.  Intravenous nitroglycerin.  6.  Further measures per Dr. Swaziland.      MSC/MEDQ  D:  11/23/2004  T:  11/23/2004  Job:  [id number]   cc:   [name], MD  [address]  [place], [place] [address]  Fax: [phone]   Peter M. Swaziland, M.D.  [address], [address]  [place], [place] [address]  Fax: [phone]

## 2011-03-01 NOTE — Cardiovascular Report (Signed)
Antonio [name], SALING NO.:  [id number]   MEDICAL RECORD NO.:  [id number]          PATIENT TYPE:  INP   LOCATION:  3715                         FACILITY:  [facility]   PHYSICIAN:  Elmore Guise., M.D.DATE OF BIRTH:  1946-01-16   DATE OF PROCEDURE:  10/08/2005  DATE OF DISCHARGE:                              CARDIAC CATHETERIZATION   INDICATIONS FOR PROCEDURE:  Non-ST-elevation myocardial infarction.   DESCRIPTION OF PROCEDURE:  The patient was brought to the cardiac  catheterization lab after appropriate informed consent. He was prepped and  draped in sterile fashion. Approximately 15 mL of 1% lidocaine was used for  local anesthesia. A 6-French sheath placed in the right femoral artery  without difficulty. Coronary angiography was then performed followed by vein  graft LIMA angiography and LV angiography. The LIMA was visualized with a  LIMA catheter after exchange with the JR-4.   FINDINGS:  1.  Left Main: No significant disease.  2.  LAD: Proximally occluded.  3.  Left circumflex: Proximal 50-60% stenosis followed by moderate mid and      distal luminal irregularities. OM-1 and OM-2 seen with competitive flow.  4.  RCA: Subtotal mid occlusion with sluggish distal flow.  5.  Vein graft to the distal RCA is patent with distal 40-50% stenosis prior      to touchdown  6.  Vein graft sequentially to the OM-1 and OM-2 is patent without      significant disease. Both native vessels fill well and fill retrograde      to their proximal portions.  7.  Vein graft to the diagonal was patent with excellent flow in native      vessel.  8.  LIMA to LAD is patent with no significant disease in the LIMA      visualized. LAD distal to the touchdown in the distal LAD wrapping      around the apex showed a 40-50% stenosis.  9.  LV: EF 55-60%. LVEDP is 13 mmHg. No wall motion abnormalities were seen.   IMPRESSION:  1.  Native vessel obstructive coronary disease.  2.  Patent  vein graft to the right coronary artery, vein graft sequentially      to obtuse marginal #1 and obtuse marginal #2, vein graft to the diagonal      and left internal mammary artery to the left anterior descending.  3.  Small vessel disease.  4.  Preserved left ventricular systolic function.   PLAN:  1.  Maximum medical therapy. Restart Lovenox and Integrilin and continue for      24 hours, Plavix, aspirin.  2.  Will check his cholesterol for aggressive lipid management.  3.  If pain continues, would recommend nuclear study to guide percutaneous      coronary intervention of native vessels as needed.      Elmore Guise., M.D.  Electronically Signed     TWK/MEDQ  D:  10/08/2005  T:  10/08/2005  Job:  [id number]   cc:   Peter M. Swaziland, M.D.  Fax: [phone]

## 2011-03-01 NOTE — Discharge Summary (Signed)
[place]  Patient:    Antonio [name], Antonio [name]                     MRN: [id number] Adm. Date:  [id number] Disc. Date: [id number] Attending:  [place], Peter Manning                           Discharge Summary  HISTORY OF PRESENT ILLNESS:  Antonio [name] is a 65 year old black male who presented to the emergency room with prolonged substernal chest discomfort of approximately 12 hours duration.  It was described as substernal radiating to the back.  Patient has noted history of coronary artery disease status post coronary artery bypass graft surgery in February, 1996.  He subsequently had two angioplasties to the saphenous vein graft, to the right coronary artery. His last heart catheterization in August, 1998, showed patency of all his grafts with somewhat diffuse small-vessel disease.  Patient has continued to abuse tobacco.  For details of his past medical history, social history, and family history, please see admission history and physical.  PHYSICAL EXAMINATION:  VITAL SIGNS:  Blood pressure was 120/70, pulse 60, afebrile.  Saturations were normal.  He had no carotid bruits.  CHEST:  Clear.  CARDIAC:  Regular rate and rhythm without gallops, murmurs, or clicks.  ABDOMEN:  Benign.  He had no edema.  Distal pulses were good.  LABORATORY DATA:  ECG showed normal sinus rhythm with left atrial enlargement and T wave abnormality consistent with anterior lateral ischemia.  Chest x-ray showed no active disease.  White count was 5900, hemoglobin 14, hematocrit 40.4, platelets 122,000. Coags were normal.  Sodium 135, potassium 3.9, chloride 103, CO2 25, glucose 104, BUN 17, creatinine 1.1, calcium 8.5.  AST was 41.  Remainder of chemistries were unremarkable.  CK was 168 with 2.2 MB.  Troponin I was 0.03. Serial cardiac enzymes showed a Antonio [name] CPK of 441, a 38.3 MB, and troponin increased to 0.63.  Lipid panel showed a cholesterol of 157, triglycerides of 76,  HDL of 32, and LDL of 110.  HOSPITAL COURSE:  Patient underwent cardiac catheterization on January 27, 2000. This demonstrated continued patency of all his grafts.  Patient does have 70 to 80% disease in the distal circumflex and had fairly small targets diffusely that were diffusely diseased distally.  It was recommended that he be treated with medical therapy.  Patient had no recurrent chest pain.  His right groin remained stable.  He remained hemodynamically stable.  He was treated with beta blocker, nitrates, aspirin, and Plavix.  We also increased his Zocor to 20 mg per day.  We discussed smoking cessation strategies.  Patient was discharged home in stable condition on January 29, 2000.  DISCHARGE DIAGNOSES: 1. Non-Q-wave myocardial infarction. 2. Status post coronary artery bypass graft with continued graft patency. 3. Acute atherosclerosis with small vessel disease. 4. Tobacco abuse. 5. Dyslipidemia.  DISCHARGE MEDICATIONS: 1. Coated aspirin 325 mg daily. 2. Plavix 75 mg daily. 3. Imdur 30 mg per day. 4. Lopressor 25 mg b.i.d. 5. Zocor 20 mg per day. 6. Nitroglycerin p.r.n.  DISCHARGE INSTRUCTIONS:  Patient instructed on regular walking program, smoking cessation, and low-fat diet.  He will follow up in one week with Dr. Swaziland. DD:  02/14/00 TD:  02/16/00 Job: [id number]

## 2011-03-01 NOTE — Op Note (Signed)
NAMEMARQUIST, BINSTOCK NO.:  [id number]   MEDICAL RECORD NO.:  [id number]          PATIENT TYPE:  INP   LOCATION:  3032                         FACILITY:  [facility]   PHYSICIAN:  Stefani Dama, M.D.  DATE OF BIRTH:  25-Dec-1945   DATE OF PROCEDURE:  02/04/2005  DATE OF DISCHARGE:                                 OPERATIVE REPORT   PREOPERATIVE DIAGNOSES:  1.  Cervical spondylosis.  2.  Herniated nucleus pulposus, cervical 6, cervical 7 with left cervical      radiculopathy.   POSTOPERATIVE DIAGNOSES:  1.  Cervical spondylosis.  2.  Herniated nucleus pulposus, cervical 6, cervical 7, with left cervical      radiculopathy.   OPERATION PERFORMED:  1.  Anterior cervical decompression, cervical 5, cervical 7.  2.  Arthrodesis with structural allograft, Alphatek plate fixation.   SURGEON:  Stefani Dama, M.D.   FIRST ASSISTANT:  [name], M.D.   ANESTHESIA:  General endotracheal.   INDICATIONS:  Antonio [name] is a 65 year old individual who has had  significant neck, shoulder and left arm pain.  He has evidence of a  herniated nucleus pulposus with spondylitic changes at the C6, C7 level  causing a left cervical radiculopathy.  He had been advised regarding  surgical decompression and arthrodesis.   DESCRIPTION OF OPERATION:  The patient was brought to the operating room and  placed on the table in the supine position.  After smooth induction of  general endotracheal anesthesia he was placed in five pounds of Halter  traction.  The neck was shaved, prepped with DuraPrep and draped in a  sterile fashion.  A transverse incision was made in the left side of the  neck, and this was carried down through the platysma.  The plane between the  sternocleidomastoid and the strap muscles was dissected bluntly until the  prevertebral space was reached.   The first identifiable disk space was noted to be that of C5, C6 and a  localizing radiographic dissection was  carried out a little bit inferiorly  to expose C6 and C7.  The longus colli muscle was then stripped off the  ventral aspect of the vertebral bodies to allow placement of a Caspar  retractor.   Then a diskectomy was performed opening the anterior longitudinal ligament  and dissecting a significant quantity of severe degenerated disk material  from what was in the disk space.  The endplates were cleared of the disk  material until bony surfaces were well-decorticated.   The inferior aspect of the disk space was the opened and there was noted to  be on the left side significant osteolytic overgrowth in the region of the  uncinate process, which caused some narrowing in the region of the foreman.  This was carried down with a high-speed air drill and 2.5 mm dissecting  tool.  The foramen was opened and carefully then the ligament itself was  opened wit the 1 and the 2 mm Kerrison punch, thus exposing the takeoff of  the C7 nerve foot and into the region of the foramen.  Once this  area was  decompressed hemostasis from epidural bleeding was obtained with bipolar  cautery and some small pledgets of Gelfoam soaked in thrombin.   Attention was turned to the right side, which was decompressed in a similar  fashion.  Once adequate hemostasis was obtained an 8 mm standard-sized  TranZgraft was shaped and fitted to the interspace after being packed with  the patient's own bone obtained from the uncinate dissection mixed with  demineralized bone matrix.  The graft was inserted into the interval and  tamped until flat, and then traction was removed and a 18 mm standard-sized  Alphatek plate was affixed to the ventral aspect of the vertebral bodies  with four locking 4 x 14 mm screws.   Final radiographs identified good position of the hardware and the graft.   The wound was copiously irrigated with antibiotic irrigating solution.  Hemostasis in the soft tissues was meticulously obtained and then  the  platysma was closed with 3-0 Vicryl in interrupted fashion and 3-0 Vicryl  was used to close the subcuticular tissues.  Dermabond was placed on the  skin.      HJE/MEDQ  D:  02/04/2005  T:  02/04/2005  Job:  [id number]

## 2011-03-01 NOTE — Op Note (Signed)
   NAME:  Antonio [name], Antonio [name]                        ACCOUNT NO.:  [id number]   MEDICAL RECORD NO.:  [id number]                   PATIENT TYPE:  AMB   LOCATION:  ENDO                                 [facility]ACILITY[facility]  [facility]   PHYSICIAN:  John C. Madilyn Fireman, M.D.                 DATE OF BIRTH:  May 07, 1946   DATE OF PROCEDURE:  10/13/2002  DATE OF DISCHARGE:                                 OPERATIVE REPORT   PROCEDURE PERFORMED:  Colonoscopy.   ENDOSCOPIST:  Everardo All. Madilyn Fireman, M.D.   INDICATIONS FOR PROCEDURE:  Family history of colon polyps in a fourth  degree relative in a 65 year old patient with no prior colon screening.   DESCRIPTION OF PROCEDURE:  The patient was placed in the left lateral  decubitus position and placed on the pulse monitor with continuous low-flow  oxygen delivered by nasal cannula.  He was sedated with 75 mcg of IV  fentanyl and 7 mg IV Versed.  The Olympus video colonoscope was inserted  into the rectum and advanced to the cecum, confirmed by transillumination of  McBurney's point and visualization of the ileocecal valve and appendiceal  orifice.  The prep was good.  The cecum, ascending, transverse, descending  and sigmoid colon all appeared normal with no masses, polyps, diverticula or  other mucosal abnormalities.  The rectum likewise appeared normal and a  retroflex view of the anus revealed no obvious internal hemorrhoids.  The  colonoscope was then withdrawn and the patient returned to the recovery room  in stable condition.  The patient tolerated the procedure well. and there  were no immediate complications.   IMPRESSION:  Normal colonoscopy.   PLAN:  Repeat study in five years based on family history.                                               John C. Madilyn Fireman, M.D.    Renato Shin  D:  10/13/2002  T:  10/13/2002  Job:  [id number]   cc:   Lorelle Formosa, M.D.  [phone] [address]  [place]  [place] [address]  Fax: [phone]

## 2011-03-01 NOTE — Discharge Summary (Signed)
NAME:  WELBORN, KEENA NO.:  [id number]   MEDICAL RECORD NO.:  [id number]           PATIENT TYPE:   LOCATION:                                 FACILITY:   PHYSICIAN:  Peter M. Swaziland, M.D.       DATE OF BIRTH:   DATE OF ADMISSION:  DATE OF DISCHARGE:                                 DISCHARGE SUMMARY   HISTORY OF PRESENT ILLNESS:  Mr. Eslick is a 65 year old black male who was  admitted with sudden onset of substernal chest pain radiating to his left  arm.  He was sitting watching a football game on T.V.  This was associated  with mild nausea and dizziness.  This was unrelieved with sublingual  nitroglycerin but all he had was old powdery pills.  He presented to the  emergency room for further evaluation.  Patient has a known history of  coronary artery disease and has had previous coronary artery bypass surgery  in 1996.  He subsequently had angioplasty of the vein graft to the right  coronary artery.  He has a history of hypertension and hyperlipidemia and  was a former smoker.  For details of his past medical history, social  history, family history, physical examination please see admission history  and physical.   LABORATORY DATA:  Total cholesterol was 144, triglycerides 165, HDL 32, LDL  was 79.  pH 7.44, pCO2 of 33, bicarbonate of 22.  White count is 4900,  hemoglobin 14.4, hematocrit 42, platelets 174,000.  Coags were normal.  Sodium 137, potassium 3.9 chloride, 107, CO2 26, glucose of 101, BUN of 17,  creatinine 1.5.  LFTs were all normal.  Initial CK was 190 with 7.9 MB,  troponin was 0.15.  Subsequent serial cardiac enzymes showed a peak CPK.  Showed CK increased to 1279 with 143.5 MB followed by 1650 with 146.9 and  then 1522 with 93.9 MB.  Troponin peaked at 20.35.  Chest x-ray showed no  active disease.  ECG showed normal sinus rhythm with mild T-wave inversion  in leads 1 and aVL.  There were no acute changes noted.   HOSPITAL COURSE:  The  patient was admitted to telemetry.  He was treated  with subcutaneous Lovenox and IV nitroglycerin.  He was continued on his  other medications.  He had no subsequent chest pain or serial ECG changes.  On October 08, 2005 he underwent cardiac catheterization.  This  demonstrated total occlusion of the proximal LAD.  He had occlusion of the  first and second obtuse marginal vessels.  There was approximately 50%  stenosis in the left circumflex coronary artery with moderate distal vessel  disease.  The right coronary had a subtotal occlusion in the mid vessel.  The vein graft sequentially to first and second marginal vessels was widely  patent with good runoff.  Vein graft to the diagonal was patent with good  runoff.  The LIMA graft to LAD was widely patent.  The vein graft to the  right coronary artery was patent.  There was an ulcerated plaque in the  distal vein graft  with a stenosis of approximately 40-50%.  Left ventricular  function appeared normal with ejection fraction 55-60% and LVEDP was normal.  On review of his films it was felt that the culprit lesion was the ulcerated  plaque in the distal vein graft to the right coronary.  This appeared to be  non-obstructive at this time.  Patient remained asymptomatic and was  progressively ambulated.  We recommended a trial of Plavix but he states  that the Plavix made him feel sick.  He states it game him a headache and he  just did not feel well with taking the Plavix and the symptoms seemed to  abate after stopping Plavix.  We recommended instead a trial of Ticlid and  this was started prior to discharge.  He was ambulated with cardiac  rehabilitation and had no further complaints and was discharged home in  stable condition on October 10, 2005.  It was felt that if he had recurrent  chest pain then we may need to consider intervention of the vein graft to  the right coronary   DISCHARGE DIAGNOSIS:  1.  Non Q-wave myocardial  infarction.  2.  Arteriosclerotic cardiovascular disease status post CABG in 1996.  3.  Dyslipidemia.  4.  Hypertension.  5.  Remote history of smoking.   DISCHARGE MEDICATIONS:  1.  Coated aspirin 325 mg per day.  2.  Metoprolol 25 mg twice a day.  3.  Zocor 80 mg per day.  4.  Benicar/HCT 20/25 mg daily.  5.  Multivitamin daily.  6.  Ticlid 250 mg twice a day.  7.  Nitroglycerin 0.4 mg sublingual p.r.n.   Patient is to remain on low-fat and low-salt diet.  He is to avoid driving  for one week and lifting for one week.  Recommend return to work on October 21, 2005.  Patient will follow up Dr. Swaziland in one week.  Discharge status  is improved.           ______________________________  Peter M. Swaziland, M.D.     PMJ/MEDQ  D:  10/10/2005  T:  10/10/2005  Job:  [id number]   cc:   Lorelle Formosa, M.D.  Fax: [phone]

## 2011-03-01 NOTE — H&P (Signed)
[place]. [facility]  Patient:    Antonio [name], Antonio [name]                     MRN: [id number] Adm. Date:  [id number] Attending:  Cathren Laine CC:         Peter M. Swaziland, M.D.                         History and Physical  PRIMARY CARDIOLOGIST:  Peter M. Swaziland, M.D.  HISTORY:  Antonio [name] is a 65 year old African-American, who presented to the ER w/ complaints of substernal chest pain for approximately 12 hours duration.  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  Chest pain was described as substernal and radiated to the back.  The patient states, the chest pain is similar to his previous chest pain.  PAST MEDICAL HISTORY:  Significant for coronary artery disease.  He is status post CABG in February 1996.  Following the CABG, he had an angioplasty x 2 to the saphenous vein graft to the right coronary artery.  Subsequent to the angioplasty to the right coronary artery, he has had two subsequent left heart catheterizations.  This last left heart catheterization was in August 1998 for recurrent chest pain.  At this time, the catheterization, he was noted to have severe 3-vessel coronary artery disease involving the native arteries and a patent saphenous vein graft to the first and second obtuse marginal, patent saphenous ein graft to the PDA, patent saphenous vein graft to the diagonal and a patent LIMA. His ejection fraction was preserved.  The left heart catheterization of August 998 was unchanged from August 1997.  The patient is somewhat of a vague historian with description of his chest pain.  He has not taken any sublingual nitroglycerin for chest pain, except on the night of presentation when he took three sublingual nitroglycerin and was given an additional two with some relief, but not total relief of his chest pain.  There was some shortness of breath with the chest pain, but no nausea, no vomiting and no  diaphoresis was noted.  The chest pain did radiate to the left arm.  There appeared to be no aggravating or relieving factors noted.  CURRENT MEDICATIONS:  Lopressor 25 mg p.o. b.i.d., enteric-coated aspirin p.o. q.day, Pravachol 20 mg p.o. q.day and multivitamins.  There is no known drug allergies.  SOCIAL HISTORY:  He continues to smoke, has attempted to quit in the past, but unsuccessfully.  He is divorced and has one daughter.  Continues to work for ______.  PAST SURGICAL HISTORY:  Significant surgery to the left hand and left hip.  PAST MEDICAL HISTORY:  Significant for hypertension and hypercholesterolemia.  PHYSICAL EXAMINATION:  GENERAL:  Reveals a well-nourished African-American in no acute distress.  VITALS:  His blood pressure has ranged from 120-133/70s and 90.  Heart rate was  60-67. He is afebrile.  O2 saturation is 97% on room air.  NECK:  There is neck vein distension noted.  No carotid bruits are noted.  PULMONARY EXAMINATION:  Reveals breath sounds, which are equal, clear to auscultation.  CARDIOVASCULAR EXAMINATION:  Reveals a normal S1, normal S2, regular rate and rhythm.  There is no rubs, murmurs or gallops noted.  ABDOMEN:  Soft and nontender.  There is no bruits noted.  No abnormal pulsations are noted.  EXTREMITIES:  Do not reveal any peripheral edema.  His distal pulses are equal.   LABORATORY DATA:  His ECG reveals a normal sinus rhythm.  There is ______ and ______.  The T-wave inversion is new in comparison to the EKG of August 1998.  White count is 5.9, hematocrit 40, platelet count is 122, sodium is 135, potassium is 3.9, BUN is 17, creatinine 1.1.  His CK total is 168 with a CK-MB of 2.2. His troponin I has been negative.  Chest x-ray does not reveal any acute CHF.  IMPRESSION:  Prolonged episode of chest pain similar in nature to the chest pain in 1998, at which time, a left heart catheterization was performed and not noted to have  any significant difference from 1997.  He has had chest pain for more than 12 hours with normal cardiac enzymes.  He continues to have some residual substernal chest pain.  As such, he will be admitted for unstable angina, started on IV nitroglycerin and IV heparin drip, serial cardiac enzymes will be obtained. Consideration will be given to a stress Cardiolite, if chest pain can be controlled.  Other etiologies of chest pain includes musculoskeletal pain, although, there is no increased tenderness to palpation and gastritis.  As such, he will be started on Protonix as empiric treatment for his gastritis.  He will also be started on Imdur 30 mg p.o. q.day. DD:  01/26/00 TD:  01/26/00 Job: [id number]

## 2011-03-01 NOTE — Op Note (Signed)
[place]. [facility]  Patient:    Antonio [name], Antonio [name]                     MRN: [id number] Proc. Date: 01/27/00 Adm. Date:  [id number] Attending:  Cathren Laine                           Operative Report  PROCEDURE PERFORMED:  Left heart catheterization, coronary angiography, single plane ventriculogram, and engagement of saphenous vein graft and left internal mammary artery.  INDICATIONS FOR PROCEDURE:  The patient is a 65 year old gentleman, previous coronary arterial bypass with recurrent angina, cardiac enzymes positive with new ECG changes.  DESCRIPTION OF PROCEDURE:  After obtaining written informed consent, the patient was brought to the cardiac catheterization lab on an emergent basis.  Both groins were prepped and draped in the usual sterile fashion.  Local anesthesia was achieved using 1% Xylocaine.  A 7 French hemostasis sheath was placed into the right femoral artery using a modified Seldinger technique.  Selective coronary angiography was performed using a JL4 and JR4 Judkins catheter.  The saphenous ein graft to the obtuse marginal and to the right coronary artery was engaged with  JR4 as well as the saphenous vein graft to the LIMA.  The vein graft to the diagonal was engaged with a left pulmonary artery bypass graft.  All catheter exchanges were made over a guidewire.  The hemostasis sheath was flushed after ach injection.  Following review of the films, the films were then reviewed with Dr. Viann Fish and it was felt that there was no significant lesions which could be intervened upon.  FINDINGS:  The aortic pressure was 99/70.  The LV pressure was 95/13.  Single plane ventriculogram revealed a small posterior pseudoaneurysm.  There was a small focal area of anterior dyskinesis, preserved ejection fraction of 50%.  CORONARY ANGIOGRAPHY:  Left main coronary artery:  The left main coronary artery bifurcated into the left anterior  descending and circumflex vessel.  There was o significant disease in the left main coronary artery.  Left anterior descending:  The left anterior descending gave rise to a small septal perforator and a small diagonal branch.  The first diagonal branch had a 60-70%  long lesion proximally.  The diagonal branch itself was approximately a 1 mm size vessel.  Left circumflex vessel:  The left circumflex vessel was diffusely diseased. The first and second obtuse marginals were totally occluded.  There was a 50-60% proximal lesion.  The distal circumflex had a long 80-90% lesion.  The distal circumflex was approximately 1.5 mm in size.  Right coronary artery:  The native right coronary artery was also diffusely diseased, gave rise to two artery marginals and then was totally occluded.  Left internal mammary artery:  The left internal mammary artery to the left anterior descending was widely patent.  The distal LAD itself had a 60-70% lesion. The saphenous vein graft to the first obtuse marginals and second obtuse marginal remain patent.  The saphenous vein graft to the diagonal was patent.  There was a 40% lesion just distal to the anastomotic site of the diagonal branch.  The diagonal branch was a small caliber vessel.  Saphenous vein graft to the PDA was patent.  There PDA itself was also a small caliber vessel.  There was a 40% lesion in the saphenous vein graft to the PDA.  IMPRESSION:  Anterior lateral dyskinesis noted.  Small posterior pseudoaneurysm  noted,  preserved ejection fraction of approximately 50%.  Severe native coronary artery disease involving the left anterior descending, circumflex and right coronary artery.  The distal circumflex disease appears to have progressed somewhat since his last film, but is not of significant caliber to entertain angioplasty. The distal left anterior descending disease appears unchanged.  The area of ischemia may be coming from  the first diagonal branch.  The patient is currently resolving his pain.  Further considerations will be for pain control with nitrates and strong encouragement for smoking cessation. DD:  01/27/00 TD:  01/27/00 Job: [id number]

## 2011-03-18 ENCOUNTER — Ambulatory Visit: Payer: BC Managed Care – PPO | Admitting: Endocrinology

## 2011-03-18 DIAGNOSIS — Z0289 Encounter for other administrative examinations: Secondary | ICD-10-CM

## 2011-03-28 ENCOUNTER — Encounter: Payer: Self-pay | Admitting: Cardiovascular Disease

## 2011-03-28 ENCOUNTER — Other Ambulatory Visit: Payer: Self-pay | Admitting: Cardiology

## 2011-03-29 ENCOUNTER — Other Ambulatory Visit: Payer: Self-pay | Admitting: *Deleted

## 2011-03-29 ENCOUNTER — Telehealth: Payer: Self-pay | Admitting: Cardiology

## 2011-03-29 NOTE — Telephone Encounter (Signed)
No samples avail, pt informed

## 2011-03-29 NOTE — Telephone Encounter (Signed)
Called in needing some free samples of Effant if we have them. Please call back. I couldn't find the chart.

## 2011-04-01 MED ORDER — SIMVASTATIN 80 MG PO TABS: 80.0000 mg | ORAL_TABLET | Freq: Every day | ORAL | Status: DC

## 2011-04-02 ENCOUNTER — Other Ambulatory Visit: Payer: Self-pay | Admitting: Cardiology

## 2011-04-02 ENCOUNTER — Telehealth: Payer: Self-pay | Admitting: Cardiology

## 2011-04-02 MED ORDER — SIMVASTATIN 80 MG PO TABS: 80.0000 mg | ORAL_TABLET | Freq: Every day | ORAL | Status: DC

## 2011-04-02 NOTE — Telephone Encounter (Signed)
Med refill simvastatin

## 2011-04-02 NOTE — Telephone Encounter (Signed)
PT SAID HE NEEDS SOMETHING DOCUMENTING THAT HE HAS A HEART CONDITION. CHART IN BOX.

## 2011-04-04 ENCOUNTER — Telehealth: Payer: Self-pay | Admitting: Cardiology

## 2011-04-04 ENCOUNTER — Encounter: Payer: Self-pay | Admitting: *Deleted

## 2011-04-04 NOTE — Telephone Encounter (Signed)
Returned your phone call from yesterday. Please call back. I could not find the file.

## 2011-04-04 NOTE — Telephone Encounter (Signed)
Called requesting samples of Effient. Also needs note stating he is being treated for heart condition.

## 2011-05-22 ENCOUNTER — Ambulatory Visit: Payer: BC Managed Care – PPO | Admitting: Cardiology

## 2011-05-22 ENCOUNTER — Ambulatory Visit: Payer: BC Managed Care – PPO | Admitting: *Deleted

## 2011-06-10 ENCOUNTER — Telehealth: Payer: Self-pay | Admitting: Cardiology

## 2011-06-10 ENCOUNTER — Encounter: Payer: Self-pay | Admitting: Cardiology

## 2011-06-10 ENCOUNTER — Emergency Department (HOSPITAL_COMMUNITY): Payer: BC Managed Care – PPO

## 2011-06-10 ENCOUNTER — Observation Stay (HOSPITAL_COMMUNITY)
Admission: EM | Admit: 2011-06-10 | Discharge: 2011-06-11 | DRG: 143 | Disposition: A | Discharge status: Home / Self Care | Payer: BC Managed Care – PPO | Attending: Cardiology | Admitting: Cardiology

## 2011-06-10 DIAGNOSIS — E785 Hyperlipidemia, unspecified: Secondary | ICD-10-CM | POA: Diagnosis present

## 2011-06-10 DIAGNOSIS — I10 Essential (primary) hypertension: Secondary | ICD-10-CM | POA: Diagnosis present

## 2011-06-10 DIAGNOSIS — R079 Chest pain, unspecified: Secondary | ICD-10-CM

## 2011-06-10 DIAGNOSIS — R0789 Other chest pain: Principal | ICD-10-CM | POA: Diagnosis present

## 2011-06-10 DIAGNOSIS — Z8546 Personal history of malignant neoplasm of prostate: Secondary | ICD-10-CM

## 2011-06-10 DIAGNOSIS — Z87891 Personal history of nicotine dependence: Secondary | ICD-10-CM

## 2011-06-10 DIAGNOSIS — E119 Type 2 diabetes mellitus without complications: Secondary | ICD-10-CM | POA: Diagnosis present

## 2011-06-10 DIAGNOSIS — I4949 Other premature depolarization: Secondary | ICD-10-CM | POA: Insufficient documentation

## 2011-06-10 DIAGNOSIS — Z923 Personal history of irradiation: Secondary | ICD-10-CM

## 2011-06-10 DIAGNOSIS — Z951 Presence of aortocoronary bypass graft: Secondary | ICD-10-CM

## 2011-06-10 DIAGNOSIS — Z79899 Other long term (current) drug therapy: Secondary | ICD-10-CM | POA: Insufficient documentation

## 2011-06-10 DIAGNOSIS — I2510 Atherosclerotic heart disease of native coronary artery without angina pectoris: Secondary | ICD-10-CM | POA: Diagnosis present

## 2011-06-10 LAB — POCT I-STAT TROPONIN I: Troponin i, poc: 0.02 ng/mL (ref 0.00–0.08)

## 2011-06-10 LAB — URINALYSIS, ROUTINE W REFLEX MICROSCOPIC
Leukocytes, UA: NEGATIVE
Nitrite: NEGATIVE
Specific Gravity, Urine: 1.006 (ref 1.005–1.030)
Urobilinogen, UA: 0.2 mg/dL (ref 0.0–1.0)
pH: 6.5 (ref 5.0–8.0)

## 2011-06-10 LAB — POCT I-STAT, CHEM 8
BUN: 14 mg/dL (ref 6–23)
Chloride: 104 mEq/L (ref 96–112)
Creatinine, Ser: 1.3 mg/dL (ref 0.50–1.35)
Sodium: 141 mEq/L (ref 135–145)

## 2011-06-10 LAB — URINE MICROSCOPIC-ADD ON: Urine-Other: NONE SEEN

## 2011-06-10 LAB — GLUCOSE, CAPILLARY: Glucose-Capillary: 118 mg/dL — ABNORMAL HIGH (ref 70–99)

## 2011-06-10 NOTE — Telephone Encounter (Signed)
Faxed med list to pharmacy

## 2011-06-10 NOTE — Telephone Encounter (Signed)
Has a question regarding some medications that he needs refilled

## 2011-06-10 NOTE — Telephone Encounter (Signed)
Called to let us know he is in ER at [facility] w/chest pain. Sent Dr. Swaziland message that he was in ER

## 2011-06-10 NOTE — Telephone Encounter (Signed)
Thayer Ohm from [employer] he needs a med list for the following patient.  Please fax back to [phone].  For future reference, if this is something I can do, please show me where and how to do this.  If only clinical can do, please fax list to [facility].  Chart in box.

## 2011-06-10 NOTE — Telephone Encounter (Signed)
Has a question regarding her husband's medications

## 2011-06-10 NOTE — Telephone Encounter (Signed)
Called requesting samples of Micardis 80/25 and Effient. Do not have any samples of Micardis but do have some of Effient. Will pick up

## 2011-06-11 ENCOUNTER — Inpatient Hospital Stay (HOSPITAL_COMMUNITY): Payer: BC Managed Care – PPO

## 2011-06-11 LAB — COMPREHENSIVE METABOLIC PANEL
ALT: 16 U/L (ref 0–53)
AST: 19 U/L (ref 0–37)
Albumin: 3.5 g/dL (ref 3.5–5.2)
Alkaline Phosphatase: 65 U/L (ref 39–117)
CO2: 28 mEq/L (ref 19–32)
Chloride: 103 mEq/L (ref 96–112)
GFR calc non Af Amer: 56 mL/min — ABNORMAL LOW (ref >60)
Potassium: 4 mEq/L (ref 3.5–5.1)
Sodium: 138 mEq/L (ref 135–145)
Total Bilirubin: 0.2 mg/dL — ABNORMAL LOW (ref 0.3–1.2)

## 2011-06-11 LAB — GLUCOSE, CAPILLARY
Glucose-Capillary: 180 mg/dL — ABNORMAL HIGH (ref 70–99)
Glucose-Capillary: 147 mg/dL — ABNORMAL HIGH (ref 70–99)

## 2011-06-11 LAB — CBC
MCV: 83.1 fL (ref 78.0–100.0)
Platelets: 123 10*3/uL — ABNORMAL LOW (ref 150–400)
RBC: 4.62 MIL/uL (ref 4.22–5.81)
RDW: 15.3 % (ref 11.5–15.5)
WBC: 4.7 10*3/uL (ref 4.0–10.5)

## 2011-06-11 LAB — CARDIAC PANEL(CRET KIN+CKTOT+MB+TROPI)
CK, MB: 3 ng/mL (ref 0.3–4.0)
CK, MB: 2.4 ng/mL (ref 0.3–4.0)
CK, MB: 2.3 ng/mL (ref 0.3–4.0)
Relative Index: 1.8 (ref 0.0–2.5)
Relative Index: 1.7 (ref 0.0–2.5)
Relative Index: 1.7 (ref 0.0–2.5)
Total CK: 165 U/L (ref 7–232)
Total CK: 133 U/L (ref 7–232)
Troponin I: <0.3 ng/mL (ref <0.30)
Troponin I: <0.3 ng/mL (ref <0.30)

## 2011-06-11 LAB — LIPID PANEL
HDL: 35 mg/dL — ABNORMAL LOW (ref >39)
LDL Cholesterol: 65 mg/dL (ref 0–99)
Triglycerides: 100 mg/dL (ref <150)

## 2011-06-11 MED ORDER — TECHNETIUM TC 99M TETROFOSMIN IV KIT
10.0000 | PACK | Freq: Once | INTRAVENOUS | Status: AC | PRN
  Administered 2011-06-11: 10 via INTRAVENOUS

## 2011-06-11 MED ORDER — TECHNETIUM TC 99M TETROFOSMIN IV KIT
30.0000 | PACK | Freq: Once | INTRAVENOUS | Status: AC | PRN
  Administered 2011-06-11: 30 via INTRAVENOUS

## 2011-06-18 NOTE — Discharge Summary (Addendum)
NAMEBRONISLAUS, [name] NO.:  [id number]  MEDICAL RECORD NO.:  [id number]  LOCATION:  1436                         FACILITY:  [facility]  PHYSICIAN:  Veverly Fells. Excell Seltzer, MD  DATE OF BIRTH:  02/02/1946  DATE OF ADMISSION:  06/10/2011 DATE OF DISCHARGE:  06/11/2011                              DISCHARGE SUMMARY   DISCHARGE DIAGNOSES: 1. Chest pain.     a.     No objective evidence of ischemia with cardiac enzymes      negative x4.     b.     Nuclear study, June 11, 2011, demonstrating moderate      inferolateral scar with some mild peri-infarct ischemia with mild      decreased anterior count with stress, not convincing of ischemia,      EF of 60%. 2. Hypertension, uncontrolled. 3. Diabetes mellitus, type 2. 4. Hyperlipidemia. 5. History of thrombocytopenia. 6. History of prostate cancer status post radiation therapy. 7. 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 8. Status post left hip surgery. 9. Status post cervical spine surgery. 10.CAD status post CABG in 1996 by Dr. Laneta Simmers including LIMA to the     LAD, SVG to the RCA, and SVG sequentially to the first and second     marginal branches.     a.     Status post PTCA of the vein graft to the RCA remotely in      1996 and 1997.  HOSPITAL COURSE:  [name] is a 65 year old gentleman with history of CAD status post CABG and hypertension, who presented to [facility] with complaints of left precordial chest pain radiating to his arm.  He tried to do his daily walk, but got half a mile and the symptoms recurred.  He described shortness of breath.  His pain abated spontaneously.  He did not take any nitroglycerin.  With recurrent chest discomfort on day of admission, he presented to the ER where his blood pressure was initially elevated at 171/100.  He reported not taking his Micardis HCT for the past month and had only  been taking his Effient sporadically.  The patient was admitted to the hospital for rule out with plans for a nuclear study in the morning.  He was started on losartan and hydrochlorothiazide for possible better compliance with his blood pressure medication.  His blood pressure did improve to a BP of 116/80 at discharge.  Dr. Myrtis Ser personally reviewed the nuclear study this afternoon and felt it demonstrated moderate inferolateral scar with mild peri-infarct ischemia with mild decreased anterior counts with stress, but he was not convinced that these represented ischemia.  He had hypokinesis of the inferobasal EF of 60%.  During the study the patient did have some ST depression v5-v6 which was reviewed with Dr. Swaziland. In light of the fact that the patient had good exercise tolerance without chest pain during peak exercise, he did not feel these changes were clinically significant. Dr. Myrtis Ser felt the [facility] study was low risk and felt he was stable for discharge.  Per Dr. Excell Seltzer, the plan was  for discharge if his nuclear study required no further intervention.  LABORATORY DATA:  WBC 4.7, hemoglobin 12.9, hematocrit 38.4, platelet count 123, sodium 138, potassium 4, chloride 103, CO2 20, glucose 154. BUN 14, creatinine 1.3.  LFTs were normal on August 28, except for a total bilirubin, which is mildly decreased to 0.2.  A1c 7.  Cardiac enzymes negative x4.  Total cholesterol 120, HDL 35, LDL 65, triglycerides 100.  UA showed 30 protein, otherwise, negative.  STUDIES: 1. Nuclear Myoview, see above. 2. Chest x-ray 27, 2012, demonstrated no acute findings.  DISCHARGE MEDICATIONS: 1. Losartan and HCTZ 100/25 mg daily. 2. Simvastatin 80 mg half-tablet at bedtime.  Pharmacy recommended     that this be changed secondary to the risk of myopathy given recent     guideline changes.  The patient is instructed to follow up with Dr.     Swaziland for continued monitoring of cholesterol level. 3.  Aspirin 81 mg daily. 4. Actos 45 mg daily. 5. Effient 10 mg daily. 6. Finasteride 5 mg daily. 7. Glipizide 10 mg daily. 8. Glucophage XR 500 mg 2 tablets b.i.d. 9. Metoprolol tartrate 50 mg b.i.d. 10.Multivitamin 1 tablet daily.  DISPOSITION:  [name] will be discharged in stable condition to home.  He is instructed to increase activity slowly as tolerated and keep his IV site clean and dry.  He is to follow a low-sodium heart- healthy diabetic diet and will follow up with Dr. Swaziland.  That appointment will be made prior to discharge.  Duration of discharge encounter: greater than 30 minutes including physician and PA time.     Antonio [name], P.A.C.   ______________________________ Veverly Fells. Excell Seltzer, MD    DD/MEDQ  D:  06/11/2011  T:  06/12/2011  Job:  [id number]  cc:   Peter M. Swaziland, M.D. Fax: [phone]  Electronically Signed by Antonio [name]  on 06/18/2011 08:37:22 AM Electronically Signed by Antonio Bollman MD on 06/19/2011 11:34:22 PM

## 2011-06-20 NOTE — H&P (Signed)
NAMEMarland Kitchen  ENDY, Antonio NO.:  [id number]  MEDICAL RECORD NO.:  [id number]  LOCATION:  1436                         FACILITY:  [facility]  PHYSICIAN:  Peter M. Swaziland, M.D.  DATE OF BIRTH:  [birth year]  DATE OF ADMISSION:  06/10/2011 DATE OF DISCHARGE:                             HISTORY & PHYSICAL   HISTORY OF PRESENT ILLNESS:  Antonio [name] is a pleasant 65 year old black male well known to me.  He has a history of coronary artery disease and is status post coronary artery bypass surgery in 1996.  He had subsequent angioplasty of the saphenous vein graft to the right coronary artery x2 in 1996 and in 1997.  His last cardiac catheterization in June 2010 showed that his grafts were all patent.  Yesterday, the patient developed left precordial chest pain, radiated to his arm.  He tried to do his daily walk, but he states he got half a mile and the symptoms recurred.  It is associated with shortness of breath.  He was able to make it home and his pain abated.  He did not take any nitroglycerin. Today, he had recurrent chest discomfort associated with a weak feeling and came to the emergency department.  He is now pain-free.  His blood pressure in the emergency department was initially elevated at 171/100. He reports that he is not taking his Micardis HCT for the past month. He has been taking his Effient only sporadically.  PAST MEDICAL HISTORY: 1. Coronary artery disease, status post CABG in 1996 by Dr. Laneta Simmers.     This included an LIMA graft to the LAD, saphenous vein graft to the     diagonal, saphenous vein graft to the right coronary artery and     saphenous vein graft sequentially to the first and second marginal     branches.  He is status post PTCA of the vein graft to the right     coronary artery remotely in 1996 and in 1997. 2. Diabetes mellitus type 2. 3. Hyperlipidemia. 4. Hypertension. 5. History of thrombocytopenia. 6. History of prostate cancer, status  post radiation therapy. 7. History of lower GI bleed in April 2011 due to radiation proctitis.     He was treated with ERBE argon laser tissue coagulation. 8. Status post left hip surgery. 9. Status post cervical spine surgery.  ALLERGIES:  He is allergic to DEMEROL and PLAVIX.  MEDICATIONS: 1. Metoprolol 50 mg b.i.d. 2. Aspirin 81 mg daily. 3. Effient 10 mg daily. 4. Simvastatin 80 mg daily. 5. Metformin 1000 mg b.i.d. 6. Multivitamin daily. 7. Micardis HCT 80/25 mg daily, but he is not taking this for the past     month.  SOCIAL HISTORY:  He is married with 1 child.  He works at [employer]. He quit smoking in 2004.  FAMILY HISTORY:  He does have a family history of coronary artery disease.  REVIEW OF SYSTEMS:  He has had a headache this morning, he feels weak. Chest pain as noted above.  He denies any increased edema or orthopnea. He does not monitor his blood sugars regularly.  He does not have regular primary care physician that he sees.  All other  systems are reviewed and are negative.  PHYSICAL EXAMINATION:  GENERAL:  He is a pleasant black male in no apparent distress. VITAL SIGNS:  Blood pressure 157/97, pulse is 53, he is afebrile, sats are 96% on room air. HEENT:  He is normocephalic, atraumatic.  His pupils are equal, round, and reactive to light and accommodation.  Extraocular movements are full.  His oropharynx is clear with some missing teeth. NECK:  Supple without JVD, adenopathy, thyromegaly, or bruits. LUNGS:  Clear. CARDIAC:  Regular rate and rhythm without gallop, murmur, or click. ABDOMEN:  Soft and nontender.  He has no masses or bruits.  Femoral and pedal pulses are 2+ and symmetric.  SKIN:  Warm and dry.  He is alert and oriented x3.  Cranial nerves II through XII are intact. NEUROLOGIC:  He is neurologically intact, although gait is not tested.  LABORATORY DATA:  ECG shows normal sinus rhythm with T-wave flattening diffusely.  Chest x-ray shows no  active disease.  Urinalysis is negative.  Troponin is 0.02.  Sodium 141, potassium 4.5, chloride 104, CO2 of 27, BUN 14, creatinine 1.3, glucose 79, hemoglobin is 14.3. Troponin is 0.02.  IMPRESSION: 1. Chest pain consistent with possible unstable angina.  This may be     exacerbated by his poorly controlled blood pressure. 2. Coronary artery disease, status post CABG in 1996.  Subsequent     angioplasty and saphenous vein graft to the right coronary artery.     Last cardiac catheterization in June 2010 was okay. 3. Diabetes mellitus type 2. 4. Hypertension. 5. Hyperlipidemia. 6. History of prostate cancer, status post radiation therapy. 7. History of radiation proctitis.  PLAN:  We will admit the patient.  We will hold his metformin and treat him with sliding scale insulin.  We will give him  subcu Lovenox.  We will check serial cardiac enzymes and ECG.  We will start him on losartan HCT for his blood pressure since this is generic, he may be able to afford it better.  We will continue his other medications.  If his cardiac enzymes are negative, I will plan a stress Myoview study in the morning.  If positive, he will need a cardiac catheterization.          ______________________________ Peter M. Swaziland, M.D.     PMJ/MEDQ  D:  06/10/2011  T:  06/11/2011  Job:  [id number]  Electronically Signed by PETER Swaziland M.D. on 06/20/2011 06:46:16 PM

## 2011-06-21 ENCOUNTER — Ambulatory Visit (INDEPENDENT_AMBULATORY_CARE_PROVIDER_SITE_OTHER): Payer: BC Managed Care – PPO | Admitting: Cardiology

## 2011-06-21 ENCOUNTER — Encounter: Payer: Self-pay | Admitting: Cardiology

## 2011-06-21 DIAGNOSIS — I10 Essential (primary) hypertension: Secondary | ICD-10-CM

## 2011-06-21 DIAGNOSIS — I2510 Atherosclerotic heart disease of native coronary artery without angina pectoris: Secondary | ICD-10-CM

## 2011-06-21 NOTE — Patient Instructions (Signed)
Continue taking your medications.  I will see you back in 6 months.

## 2011-06-22 NOTE — Progress Notes (Signed)
Antonio [name] Date of Birth: 1946/03/13   History of Present Illness: Antonio [name] is seen today for followup after his hospitalization with chest pain. He ruled out for myocardial infarction. He was significantly hypertensive related to the fact that he wasn't taking his medication. He cannot afford Micardis HCT. His medications were adjusted with good blood pressure control. He had a stress Myoview study. He was able to complete stage III without significant chest pain. There were some subtle abnormalities on his nuclear study with an ejection fraction of 62%. This is felt to be relatively low risk. Since discharge he has had no recurrent chest pain and feels very well. He is taking his medications.  Current Outpatient Prescriptions on File Prior to Visit  Medication Sig Dispense Refill  . aspirin 81 MG tablet Take 81 mg by mouth daily.        . finasteride (PROSCAR) 5 MG tablet Take 5 mg by mouth daily.        Marland Kitchen glipiZIDE (GLUCOTROL XL) 10 MG 24 hr tablet Take 10 mg by mouth daily.        Marland Kitchen glucose blood (ACCU-CHEK AVIVA) test strip Use as instructed test 1 to 2 times a day       . losartan-hydrochlorothiazide (HYZAAR) 100-25 MG per tablet Take 1 tablet by mouth Daily.      . metFORMIN (GLUCOPHAGE-XR) 500 MG 24 hr tablet 2 tablets by mouth two times a day       . metoprolol (LOPRESSOR) 50 MG tablet TAKE 1 TABLET TWICE A DAY  180 tablet  2  . Multiple Vitamin (MULTIVITAMIN) tablet Take 1 tablet by mouth daily.        . Prasugrel HCl (EFFIENT PO) Take by mouth daily.        . simvastatin (ZOCOR) 80 MG tablet Take 1 tablet (80 mg total) by mouth at bedtime.  90 tablet  4    Allergies  Allergen Reactions  . Demerol   . Meperidine Hcl     Past Medical History  Diagnosis Date  . 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 HYPERCHOLESTEROLEMIA 12/17/2010  . DM 12/17/2010  . HYPERTENSION 12/17/2010  . CAD 12/17/2010  . BENIGN PROSTATIC HYPERTROPHY, WITH OBSTRUCTION 12/17/2010  . Prostate cancer    s/p radiation     Past Surgical History  Procedure Date  . Cardiac catheterization 01/27/2009    ef 60%  . Coronary artery bypass graft 1996    LIMA GRAFT TO THE LAD, SAPHENOUS VEIN GRAFT SEQUENTIALLY TO THE FIRST AND SECOND OBTUSE MARGINAL VESSELS, SAPHENOUS VEIN GRAFT TO THE DIAGONAL, AND SAPHENOUS VEIN GRAFT TO THE DISTAL RIGHT CORONARY   . Hand surgery     LEFT HAND  . Foot surgery     LEFT  . Cervical spine surgery     History  Smoking status  . Former Smoker  . Quit date: 10/14/2002  Smokeless tobacco  . Not on file    History  Alcohol Use No    x 15 years    Family History  Problem Relation Age of Onset  . Diabetes Other     2 siblings, 1 of whom is deceased  . Hypertension Other   . Cancer Other     Lung Cancer  . Cancer Other     FH of Prostate Cancer 1st degree relative    Review of Systems: The review of systems is positive for erectile dysfunction.  He asked about going on testosterone but I think this  is contraindicated with his history of prostate cancer. All other systems were reviewed and are negative.  Physical Exam: BP 110/70  Pulse 72  Ht 5\' 7"  (1.702 m)  Wt 183 lb 3.2 oz (83.099 kg)  BMI 28.69 kg/m2 He is a pleasant overweight black male in no acute distress.The patient is alert and oriented x 3.  The mood and affect are normal.  The skin is warm and dry.  Color is normal.  The HEENT exam reveals that the sclera are nonicteric.  The mucous membranes are moist.  The carotids are 2+ without bruits.  There is no thyromegaly.  There is no JVD.  The lungs are clear.  The chest wall is non tender.  The heart exam reveals a regular rate with a normal S1 and S2.  There are no murmurs, gallops, or rubs.  The PMI is not displaced.   Abdominal exam reveals good bowel sounds.  There is no guarding or rebound.  There is no hepatosplenomegaly or tenderness.  There are no masses.  Exam of the legs reveal no clubbing, cyanosis, or edema.  The legs are without  rashes.  The distal pulses are intact.  Cranial nerves II - XII are intact.  Motor and sensory functions are intact.  The gait is normal. LABORATORY DATA:   Assessment / Plan:

## 2011-06-22 NOTE — Assessment & Plan Note (Signed)
He was admitted with recent chest pain. His nuclear study was relatively low risk. Since he has had no recurrent chest pain with good blood pressure control we will continue to monitor closely. If he has recurrent problems with chest pain we may need to consider cardiac catheterization.

## 2011-06-22 NOTE — Assessment & Plan Note (Signed)
Blood pressure is now under excellent control. We will continue with his current medical therapy. His micardis was switched to losartan for cost.

## 2011-06-26 ENCOUNTER — Telehealth: Payer: Self-pay | Admitting: *Deleted

## 2011-06-26 NOTE — Telephone Encounter (Signed)
Pt calling re paper work to rtn to work, and requesting samples of cialis

## 2011-06-26 NOTE — Telephone Encounter (Signed)
Have tried numerous times to call Antonio [name] but phone "says at subscriber's request can not accept incoming calls" to tell him his APS was ready to be picked up. Will mail.

## 2011-07-08 LAB — COMPREHENSIVE METABOLIC PANEL
Alkaline Phosphatase: 62
BUN: 14
CO2: 26
Chloride: 106
Creatinine, Ser: 1.37
GFR calc non Af Amer: 53 — ABNORMAL LOW
Glucose, Bld: 123 — ABNORMAL HIGH
Potassium: 3.8
Total Bilirubin: 0.3

## 2011-07-08 LAB — URINALYSIS, ROUTINE W REFLEX MICROSCOPIC
Glucose, UA: NEGATIVE
Hgb urine dipstick: NEGATIVE
Ketones, ur: NEGATIVE
Protein, ur: NEGATIVE
pH: 5.5

## 2011-07-08 LAB — APTT: aPTT: 32

## 2011-07-08 LAB — CBC
HCT: 47.1
Hemoglobin: 15.9
MCV: 89.5
WBC: 7.4

## 2011-07-08 LAB — DIFFERENTIAL
Basophils Absolute: 0
Basophils Relative: 0
Lymphocytes Relative: 21
Monocytes Absolute: 0.3
Neutro Abs: 5.4
Neutrophils Relative %: 73

## 2011-07-08 LAB — PROTIME-INR
INR: 1
Prothrombin Time: 13.3

## 2011-07-08 LAB — POCT CARDIAC MARKERS
CKMB, poc: <1 — ABNORMAL LOW
Troponin i, poc: <0.05
Troponin i, poc: <0.05

## 2011-07-15 ENCOUNTER — Telehealth: Payer: Self-pay | Admitting: Cardiology

## 2011-07-15 NOTE — Telephone Encounter (Signed)
Pt calling requesting samples effient/cialias

## 2011-07-15 NOTE — Telephone Encounter (Signed)
Called requesting samples of Effient. Have available. Will leave at front desk.

## 2011-07-19 LAB — DIFFERENTIAL
Eosinophils Relative: 3
Lymphocytes Relative: 29
Monocytes Absolute: 0.4
Monocytes Relative: 6
Neutro Abs: 4.1

## 2011-07-19 LAB — URINALYSIS, ROUTINE W REFLEX MICROSCOPIC
Bilirubin Urine: NEGATIVE
Glucose, UA: NEGATIVE
Hgb urine dipstick: NEGATIVE
Ketones, ur: NEGATIVE
Nitrite: NEGATIVE
Protein, ur: NEGATIVE
Specific Gravity, Urine: 1.019
Urobilinogen, UA: 1
pH: 6.5

## 2011-07-19 LAB — COMPREHENSIVE METABOLIC PANEL
AST: 38 — ABNORMAL HIGH
Albumin: 4
Alkaline Phosphatase: 68
BUN: 12
Chloride: 103
Creatinine, Ser: 1.39
GFR calc Af Amer: >60
Potassium: 3.5
Total Bilirubin: 0.7
Total Protein: 7.1

## 2011-07-19 LAB — CBC
MCHC: 34.9
RBC: 4.88
RDW: 15.3

## 2011-07-19 LAB — POCT CARDIAC MARKERS
CKMB, poc: 2.2
Troponin i, poc: <0.05

## 2011-08-21 ENCOUNTER — Telehealth: Payer: Self-pay | Admitting: Cardiology

## 2011-08-21 NOTE — Telephone Encounter (Signed)
Pt wants samples of effient. He said he will call back he doesn't know his cell phone number.

## 2011-08-22 NOTE — Telephone Encounter (Signed)
Called requesting Effient samples. Lm him message that we have available.

## 2011-08-26 ENCOUNTER — Other Ambulatory Visit: Payer: Self-pay | Admitting: Cardiology

## 2011-08-26 MED ORDER — SIMVASTATIN 80 MG PO TABS: 80.0000 mg | ORAL_TABLET | Freq: Every day | ORAL | Status: DC

## 2011-08-30 ENCOUNTER — Other Ambulatory Visit: Payer: Self-pay

## 2011-08-30 MED ORDER — LOSARTAN POTASSIUM-HCTZ 100-25 MG PO TABS: 1.0000 | ORAL_TABLET | Freq: Every day | ORAL | Status: DC

## 2011-09-09 ENCOUNTER — Telehealth: Payer: Self-pay | Admitting: Cardiology

## 2011-09-09 NOTE — Telephone Encounter (Signed)
New problem Pt wants rx for losartan faxed to [facility]

## 2011-09-11 MED ORDER — LOSARTAN POTASSIUM-HCTZ 100-25 MG PO TABS: 1.0000 | ORAL_TABLET | Freq: Every day | ORAL | Status: DC

## 2011-09-14 ENCOUNTER — Emergency Department (HOSPITAL_COMMUNITY): Payer: BC Managed Care – PPO

## 2011-09-14 ENCOUNTER — Encounter (HOSPITAL_COMMUNITY): Payer: Self-pay | Admitting: *Deleted

## 2011-09-14 ENCOUNTER — Emergency Department (HOSPITAL_COMMUNITY)
Admission: EM | Admit: 2011-09-14 | Discharge: 2011-09-14 | Disposition: A | Discharge status: Home / Self Care | Payer: BC Managed Care – PPO | Attending: Emergency Medicine | Admitting: Emergency Medicine

## 2011-09-14 DIAGNOSIS — Z923 Personal history of irradiation: Secondary | ICD-10-CM | POA: Insufficient documentation

## 2011-09-14 DIAGNOSIS — Z8546 Personal history of malignant neoplasm of prostate: Secondary | ICD-10-CM | POA: Insufficient documentation

## 2011-09-14 DIAGNOSIS — R079 Chest pain, unspecified: Secondary | ICD-10-CM | POA: Insufficient documentation

## 2011-09-14 DIAGNOSIS — R42 Dizziness and giddiness: Secondary | ICD-10-CM | POA: Insufficient documentation

## 2011-09-14 DIAGNOSIS — I10 Essential (primary) hypertension: Secondary | ICD-10-CM | POA: Insufficient documentation

## 2011-09-14 DIAGNOSIS — E7800 Pure hypercholesterolemia, unspecified: Secondary | ICD-10-CM | POA: Insufficient documentation

## 2011-09-14 DIAGNOSIS — E119 Type 2 diabetes mellitus without complications: Secondary | ICD-10-CM | POA: Insufficient documentation

## 2011-09-14 LAB — DIFFERENTIAL
Basophils Relative: 0 % (ref 0–1)
Lymphocytes Relative: 29 % (ref 12–46)
Lymphs Abs: 1.7 10*3/uL (ref 0.7–4.0)
Monocytes Absolute: 0.3 10*3/uL (ref 0.1–1.0)
Monocytes Relative: 5 % (ref 3–12)
Neutro Abs: 3.8 10*3/uL (ref 1.7–7.7)
Neutrophils Relative %: 64 % (ref 43–77)

## 2011-09-14 LAB — COMPREHENSIVE METABOLIC PANEL
Albumin: 4.1 g/dL (ref 3.5–5.2)
Alkaline Phosphatase: 69 U/L (ref 39–117)
BUN: 17 mg/dL (ref 6–23)
CO2: 27 mEq/L (ref 19–32)
Chloride: 100 mEq/L (ref 96–112)
GFR calc non Af Amer: 59 mL/min — ABNORMAL LOW (ref >90)
Glucose, Bld: 130 mg/dL — ABNORMAL HIGH (ref 70–99)
Potassium: 3.4 mEq/L — ABNORMAL LOW (ref 3.5–5.1)
Total Bilirubin: 0.1 mg/dL — ABNORMAL LOW (ref 0.3–1.2)

## 2011-09-14 LAB — CARDIAC PANEL(CRET KIN+CKTOT+MB+TROPI): Total CK: 159 U/L (ref 7–232)

## 2011-09-14 LAB — CBC
HCT: 43.1 % (ref 39.0–52.0)
Hemoglobin: 14.8 g/dL (ref 13.0–17.0)
RBC: 5.18 MIL/uL (ref 4.22–5.81)

## 2011-09-14 MED ORDER — MECLIZINE HCL 25 MG PO TABS
25.0000 mg | ORAL_TABLET | Freq: Once | ORAL | Status: AC
  Administered 2011-09-14: 25 mg via ORAL
  Filled 2011-09-14: qty 1

## 2011-09-14 MED ORDER — MECLIZINE HCL 50 MG PO TABS: 25.0000 mg | ORAL_TABLET | Freq: Three times a day (TID) | ORAL | Status: AC | PRN

## 2011-09-14 NOTE — ED Notes (Signed)
Pt has had dizziness for a couple of months, some chest pain with it, today feels dizziness is worse, states he had chest pain while waiting in left [place]

## 2011-09-14 NOTE — ED Notes (Signed)
Patient transported to X-ray 

## 2011-09-14 NOTE — ED Notes (Signed)
Patient is resting comfortably. 

## 2011-09-14 NOTE — ED Provider Notes (Addendum)
History     CSN: [id number] Arrival date & time: 09/14/2011  3:06 PM   First MD Initiated Contact with Patient 09/14/11 1556      Chief Complaint  Patient presents with  . Dizziness  . Chest Pain    (Consider location/radiation/quality/duration/timing/severity/associated sxs/prior treatment) HPI... patient is complained of dizziness off-and-on for 2 months. Nothing makes it better or worse. Feels like room is spinning. Symptoms happened today but they're gone again. Additionally had a brief episode of chest pain for about 15 minutes. There is no radiation of chest pain. No gross neuro deficits, confusion, motor or sensory deficits. No dyspnea, nausea, diaphoresis. Status post CABG in 1996  Past Medical History  Diagnosis Date  . 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 HYPERCHOLESTEROLEMIA 12/17/2010  . DM 12/17/2010  . HYPERTENSION 12/17/2010  . CAD 12/17/2010  . BENIGN PROSTATIC HYPERTROPHY, WITH OBSTRUCTION 12/17/2010  . Prostate cancer     s/p radiation     Past Surgical History  Procedure Date  . Cardiac catheterization 01/27/2009    ef 60%  . Coronary artery bypass graft 1996    LIMA GRAFT TO THE LAD, SAPHENOUS VEIN GRAFT SEQUENTIALLY TO THE FIRST AND SECOND OBTUSE MARGINAL VESSELS, SAPHENOUS VEIN GRAFT TO THE DIAGONAL, AND SAPHENOUS VEIN GRAFT TO THE DISTAL RIGHT CORONARY   . Hand surgery     LEFT HAND  . Foot surgery     LEFT  . Cervical spine surgery     Family History  Problem Relation Age of Onset  . Diabetes Other     2 siblings, 1 of whom is deceased  . Hypertension Other   . Cancer Other     Lung Cancer  . Cancer Other     FH of Prostate Cancer 1st degree relative    History  Substance Use Topics  . Smoking status: Former Smoker    Quit date: 10/14/2002  . Smokeless tobacco: Not on file  . Alcohol Use: No     x 15 years      Review of Systems  All other systems reviewed and are negative.    Allergies  Demerol and Meperidine hcl  Home  Medications   Current Outpatient Rx  Name Route Sig Dispense Refill  . ASPIRIN 81 MG PO TABS Oral Take 81 mg by mouth daily.      Marland Kitchen FINASTERIDE 5 MG PO TABS Oral Take 5 mg by mouth daily as needed. Constantly urinating    . LOSARTAN POTASSIUM-HCTZ 100-25 MG PO TABS Oral Take 1 tablet by mouth daily. 90 tablet 3  . METFORMIN HCL ER 500 MG PO TB24  2 tablets by mouth two times a day     . METOPROLOL TARTRATE 50 MG PO TABS  TAKE 1 TABLET TWICE A DAY 180 tablet 2  . ONE-DAILY MULTI VITAMINS PO TABS Oral Take 1 tablet by mouth daily.      Marland Kitchen SIMVASTATIN 80 MG PO TABS Oral Take 40 mg by mouth at bedtime.      Marland Kitchen GLIPIZIDE ER 10 MG PO TB24 Oral Take 10 mg by mouth daily.        BP 129/87  Pulse 71  Temp(Src) 98.3 F (36.8 C) (Oral)  Resp 19  SpO2 100%  Physical Exam  Nursing note and vitals reviewed. Constitutional: He is oriented to person, place, and time. He appears well-developed and well-nourished.  HENT:  Head: Normocephalic and atraumatic.  Eyes: Conjunctivae and EOM are normal. Pupils are equal, round, and  reactive to light.  Neck: Normal range of motion. Neck supple.  Cardiovascular: Normal rate and regular rhythm.   Pulmonary/Chest: Effort normal and breath sounds normal.  Abdominal: Soft. Bowel sounds are normal.  Musculoskeletal: Normal range of motion.  Neurological: He is alert and oriented to person, place, and time.  Skin: Skin is warm and dry.  Psychiatric: He has a normal mood and affect.    ED Course  Procedures (including critical care time)  Labs Reviewed  COMPREHENSIVE METABOLIC PANEL - Abnormal; Notable for the following:    Potassium 3.4 (*)    Glucose, Bld 130 (*)    Total Bilirubin 0.1 (*)    GFR calc non Af Amer 59 (*)    GFR calc Af Amer 68 (*)    All other components within normal limits  CBC  DIFFERENTIAL  CARDIAC PANEL(CRET KIN+CKTOT+MB+TROPI)   Dg Chest 2 View  09/14/2011  *RADIOLOGY REPORT*  Clinical Data: Chest pain  CHEST - 2 VIEW   Comparison: June 10, 2011  Findings: Mild cardiomegaly is stable.  The mediastinum and pulmonary vasculature are within normal limits.  Both lungs are clear.  IMPRESSION: Stable chest x-ray with no evidence of acute cardiac or pulmonary process.  Original Report Authenticated By: Brandon Melnick, M.D.   Ct Head Wo Contrast  09/14/2011  *RADIOLOGY REPORT*  Clinical Data: Dizziness.  History of diabetes. History of prostate cancer.  CT HEAD WITHOUT CONTRAST  Technique:  Contiguous axial images were obtained from the base of the skull through the vertex without contrast.  Comparison: None.  Findings: There is no evidence for acute infarction, intracranial hemorrhage, mass lesion, hydrocephalus, or extra-axial fluid. There is no atrophy or significant white matter disease.  Calvarium is intact.  There is no sinus or mastoid disease. Fourth ventricular calcification is related to the choroid plexus.  IMPRESSION: Negative exam.  Original Report Authenticated By: Elsie Stain, M.D.     No diagnosis found.  Date: 09/14/2011  Rate: 78  Rhythm: normal sinus rhythm  QRS Axis: normal  Intervals: normal  ST/T Wave abnormalities: nonspecific T wave changes  Conduction Disutrbances:none  Narrative Interpretation:   Old EKG Reviewed: changes noted    MDM  Patient has normal neurological exam. Suspect vertigo. Chest pain is fleeting. However patient does have cardiac risk factors. CT head to rule out acute event, minimal rule out workup for chest pain.  Suspect we'll discharge patient   Recheck prior to discharge;  feeling better. no chest pain   Donnetta Hutching, MD 09/14/11 1755  Donnetta Hutching, MD 09/14/11 [phone]

## 2011-09-14 NOTE — ED Notes (Signed)
Patient transported to CT 

## 2011-09-14 NOTE — ED Notes (Signed)
Vital signs stable. 

## 2011-09-14 NOTE — ED Notes (Signed)
Family at bedside. 

## 2011-09-14 NOTE — ED Notes (Signed)
MD at bedside. 

## 2011-09-16 ENCOUNTER — Telehealth: Payer: Self-pay | Admitting: Cardiology

## 2011-09-16 NOTE — Telephone Encounter (Signed)
Called requesting sample of Effient. Have available. Will leave at front desk

## 2011-09-16 NOTE — Telephone Encounter (Signed)
New Msg: Pt calling wanting samples of effient left at front desk for him. Please return pt call to discuss further.

## 2011-10-30 ENCOUNTER — Telehealth: Payer: Self-pay | Admitting: Cardiology

## 2011-10-30 NOTE — Telephone Encounter (Signed)
Patient called requesting samples of effient.Effient 10 mg samples left at front desk 3rd floor.

## 2011-10-30 NOTE — Telephone Encounter (Signed)
New msg Patient wants samples of effient please call

## 2011-11-07 ENCOUNTER — Telehealth: Payer: Self-pay | Admitting: Cardiology

## 2011-11-07 NOTE — Telephone Encounter (Signed)
Pt calling re trimador refill, uses [facility] [address]

## 2011-11-08 MED ORDER — TRAMADOL HCL 50 MG PO TABS: 50.0000 mg | ORAL_TABLET | Freq: Four times a day (QID) | ORAL | Status: DC | PRN

## 2011-11-08 NOTE — Telephone Encounter (Signed)
Patient called Tramadol 50 mg sent to [facility] on [address].

## 2011-11-26 ENCOUNTER — Other Ambulatory Visit: Payer: Self-pay | Admitting: Cardiology

## 2011-11-26 MED ORDER — METFORMIN HCL ER 500 MG PO TB24: ORAL_TABLET | ORAL | Status: DC

## 2011-11-27 ENCOUNTER — Other Ambulatory Visit: Payer: Self-pay | Admitting: Cardiology

## 2011-11-28 ENCOUNTER — Telehealth: Payer: Self-pay | Admitting: Cardiology

## 2011-11-28 NOTE — Telephone Encounter (Signed)
Spoke to Pharmacist at [facility] was calling to verify dosage of simvastatin.Advised patient was taking simvastatin 80 mg at night.Advised to call PCP about metformin.

## 2011-11-28 NOTE — Telephone Encounter (Signed)
New Problem:     Drug therapy clarifications simvastatin (ZOCOR) 80 MG tablet, metFORMIN (GLUCOPHAGE-XR) 500 MG 24 hr tablet. Please call back.

## 2011-11-29 ENCOUNTER — Other Ambulatory Visit: Payer: Self-pay

## 2011-11-29 MED ORDER — PRASUGREL HCL 10 MG PO TABS: 10.0000 mg | ORAL_TABLET | Freq: Every day | ORAL | Status: DC

## 2012-01-03 ENCOUNTER — Encounter: Payer: Self-pay | Admitting: Cardiology

## 2012-01-03 ENCOUNTER — Ambulatory Visit (INDEPENDENT_AMBULATORY_CARE_PROVIDER_SITE_OTHER): Payer: BC Managed Care – PPO | Admitting: Cardiology

## 2012-01-03 VITALS — BP 124/90 | HR 75 | Ht 67.0 in | Wt 183.0 lb

## 2012-01-03 DIAGNOSIS — K625 Hemorrhage of anus and rectum: Secondary | ICD-10-CM

## 2012-01-03 DIAGNOSIS — T82218A Other mechanical complication of coronary artery bypass graft, initial encounter: Secondary | ICD-10-CM

## 2012-01-03 DIAGNOSIS — I2510 Atherosclerotic heart disease of native coronary artery without angina pectoris: Secondary | ICD-10-CM

## 2012-01-03 DIAGNOSIS — I10 Essential (primary) hypertension: Secondary | ICD-10-CM

## 2012-01-03 NOTE — Progress Notes (Signed)
Antonio [name] Date of Birth: 14-May-1946   History of Present Illness: Antonio [name] is seen today for followup today. He has been doing very well from a cardiac standpoint. He denies any chest pain or shortness of breath. He has felt occasionally tired. On Effient he has noticed a little more nosebleeds and has had some bright red blood per occasionally when he has to strain. Review of his records indicate that he had a normal colonoscopy in 2003. He has had previous radiation therapy for prostate cancer.  Current Outpatient Prescriptions on File Prior to Visit  Medication Sig Dispense Refill  . aspirin 81 MG tablet Take 81 mg by mouth daily.        . finasteride (PROSCAR) 5 MG tablet Take 5 mg by mouth daily as needed. Constantly urinating      . glipiZIDE (GLUCOTROL XL) 10 MG 24 hr tablet Take 10 mg by mouth daily.        Marland Kitchen losartan-hydrochlorothiazide (HYZAAR) 100-25 MG per tablet Take 1 tablet by mouth daily.  90 tablet  3  . metFORMIN (GLUCOPHAGE-XR) 500 MG 24 hr tablet 2 tablets by mouth two times a day  120 tablet  5  . metoprolol (LOPRESSOR) 50 MG tablet TAKE 1 TABLET TWICE A DAY  180 tablet  2  . Multiple Vitamin (MULTIVITAMIN) tablet Take 1 tablet by mouth daily.        . prasugrel (EFFIENT) 10 MG TABS Take 1 tablet (10 mg total) by mouth daily.  30 tablet  5  . simvastatin (ZOCOR) 80 MG tablet Take 40 mg by mouth at bedtime.        . traMADol (ULTRAM) 50 MG tablet Take 1 tablet (50 mg total) by mouth every 6 (six) hours as needed for pain.  50 tablet  3    Allergies  Allergen Reactions  . Demerol Other (See Comments)    hallucinations  . Meperidine Hcl     Past Medical History  Diagnosis Date  . 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 HYPERCHOLESTEROLEMIA 12/17/2010  . DM 12/17/2010  . HYPERTENSION 12/17/2010  . CAD 12/17/2010  . BENIGN PROSTATIC HYPERTROPHY, WITH OBSTRUCTION 12/17/2010  . Prostate cancer     s/p radiation     Past Surgical History  Procedure Date  . Cardiac  catheterization 01/27/2009    ef 60%  . Coronary artery bypass graft 1996    LIMA GRAFT TO THE LAD, SAPHENOUS VEIN GRAFT SEQUENTIALLY TO THE FIRST AND SECOND OBTUSE MARGINAL VESSELS, SAPHENOUS VEIN GRAFT TO THE DIAGONAL, AND SAPHENOUS VEIN GRAFT TO THE DISTAL RIGHT CORONARY   . Hand surgery     LEFT HAND  . Foot surgery     LEFT  . Cervical spine surgery     History  Smoking status  . Former Smoker  . Quit date: 10/14/2002  Smokeless tobacco  . Not on file    History  Alcohol Use No    x 15 years    Family History  Problem Relation Age of Onset  . Diabetes Other     2 siblings, 1 of whom is deceased  . Hypertension Other   . Cancer Other     Lung Cancer  . Cancer Other     FH of Prostate Cancer 1st degree relative    Review of Systems: The review of systems is positive for erectile dysfunction.  He reports that he has been started on testosterone gel.. All other systems were reviewed and are negative.  Physical Exam: BP 124/90  Pulse 75  Ht 5\' 7"  (1.702 m)  Wt 183 lb (83.008 kg)  BMI 28.66 kg/m2 He is a pleasant overweight black male in no acute distress.The patient is alert and oriented x 3.  The mood and affect are normal.  The skin is warm and dry.  Color is normal.  The HEENT exam reveals that the sclera are nonicteric.  The mucous membranes are moist.  The carotids are 2+ without bruits.  There is no thyromegaly.  There is no JVD.  The lungs are clear.  The chest wall is non tender.  The heart exam reveals a regular rate with a normal S1 and S2.  There are no murmurs, gallops, or rubs.  The PMI is not displaced.   Abdominal exam reveals good bowel sounds.  There is no guarding or rebound.  There is no hepatosplenomegaly or tenderness.  There are no masses.  Exam of the legs reveal no clubbing, cyanosis, or edema.  The legs are without rashes.  The distal pulses are intact.  Cranial nerves II - XII are intact.  Motor and sensory functions are intact.  The gait is  normal. LABORATORY DATA:   Assessment / Plan:

## 2012-01-03 NOTE — Assessment & Plan Note (Signed)
This may just represent some hemorrhoidal bleeding and being on dual antiplatelet therapy. He is going to discuss this with his urologist. He continues to have bleeding repeat GI  evaluation may be warranted.

## 2012-01-03 NOTE — Assessment & Plan Note (Signed)
Blood pressure is well controlled today and he is compliant with his medications. I recommended continued sodium restriction.

## 2012-01-03 NOTE — Assessment & Plan Note (Signed)
He had a normal stress Myoview study in August. He remains asymptomatic. We will continue with his current therapy including antiplatelet therapy. We'll followup again in 6 months.

## 2012-01-03 NOTE — Patient Instructions (Signed)
Continue your current medication.  I will see you again in 6 months.   

## 2012-02-12 ENCOUNTER — Observation Stay (HOSPITAL_COMMUNITY)
Admission: EM | Admit: 2012-02-12 | Discharge: 2012-02-13 | DRG: 175 | Disposition: A | Discharge status: Home / Self Care | Payer: BC Managed Care – PPO | Attending: Internal Medicine | Admitting: Internal Medicine

## 2012-02-12 ENCOUNTER — Encounter (HOSPITAL_COMMUNITY): Payer: Self-pay

## 2012-02-12 DIAGNOSIS — I10 Essential (primary) hypertension: Secondary | ICD-10-CM | POA: Diagnosis present

## 2012-02-12 DIAGNOSIS — Z8249 Family history of ischemic heart disease and other diseases of the circulatory system: Secondary | ICD-10-CM

## 2012-02-12 DIAGNOSIS — E1165 Type 2 diabetes mellitus with hyperglycemia: Secondary | ICD-10-CM

## 2012-02-12 DIAGNOSIS — Z87891 Personal history of nicotine dependence: Secondary | ICD-10-CM

## 2012-02-12 DIAGNOSIS — Z7982 Long term (current) use of aspirin: Secondary | ICD-10-CM

## 2012-02-12 DIAGNOSIS — Z923 Personal history of irradiation: Secondary | ICD-10-CM

## 2012-02-12 DIAGNOSIS — Z8042 Family history of malignant neoplasm of prostate: Secondary | ICD-10-CM

## 2012-02-12 DIAGNOSIS — Z8546 Personal history of malignant neoplasm of prostate: Secondary | ICD-10-CM

## 2012-02-12 DIAGNOSIS — E7800 Pure hypercholesterolemia, unspecified: Secondary | ICD-10-CM | POA: Diagnosis present

## 2012-02-12 DIAGNOSIS — E119 Type 2 diabetes mellitus without complications: Secondary | ICD-10-CM | POA: Diagnosis present

## 2012-02-12 DIAGNOSIS — I2510 Atherosclerotic heart disease of native coronary artery without angina pectoris: Secondary | ICD-10-CM

## 2012-02-12 DIAGNOSIS — Z79899 Other long term (current) drug therapy: Secondary | ICD-10-CM

## 2012-02-12 DIAGNOSIS — E1159 Type 2 diabetes mellitus with other circulatory complications: Secondary | ICD-10-CM | POA: Diagnosis present

## 2012-02-12 DIAGNOSIS — E785 Hyperlipidemia, unspecified: Secondary | ICD-10-CM | POA: Diagnosis present

## 2012-02-12 DIAGNOSIS — Z951 Presence of aortocoronary bypass graft: Secondary | ICD-10-CM

## 2012-02-12 DIAGNOSIS — K922 Gastrointestinal hemorrhage, unspecified: Principal | ICD-10-CM

## 2012-02-12 DIAGNOSIS — Z833 Family history of diabetes mellitus: Secondary | ICD-10-CM

## 2012-02-12 DIAGNOSIS — Z801 Family history of malignant neoplasm of trachea, bronchus and lung: Secondary | ICD-10-CM

## 2012-02-12 DIAGNOSIS — K625 Hemorrhage of anus and rectum: Secondary | ICD-10-CM

## 2012-02-12 DIAGNOSIS — Z7902 Long term (current) use of antithrombotics/antiplatelets: Secondary | ICD-10-CM

## 2012-02-12 LAB — CBC
HCT: 38 % — ABNORMAL LOW (ref 39.0–52.0)
Hemoglobin: 12.9 g/dL — ABNORMAL LOW (ref 13.0–17.0)
MCH: 28.9 pg (ref 26.0–34.0)
MCHC: 33.9 g/dL (ref 30.0–36.0)
MCV: 83.9 fL (ref 78.0–100.0)
MCV: 84.4 fL (ref 78.0–100.0)
Platelets: 146 10*3/uL — ABNORMAL LOW (ref 150–400)
RDW: 14.8 % (ref 11.5–15.5)
WBC: 4.1 10*3/uL (ref 4.0–10.5)

## 2012-02-12 LAB — COMPREHENSIVE METABOLIC PANEL
Alkaline Phosphatase: 56 U/L (ref 39–117)
BUN: 12 mg/dL (ref 6–23)
GFR calc Af Amer: 64 mL/min — ABNORMAL LOW (ref >90)
Glucose, Bld: 166 mg/dL — ABNORMAL HIGH (ref 70–99)
Potassium: 3.5 mEq/L (ref 3.5–5.1)
Total Bilirubin: 0.3 mg/dL (ref 0.3–1.2)
Total Protein: 6.4 g/dL (ref 6.0–8.3)

## 2012-02-12 LAB — DIFFERENTIAL
Basophils Relative: 0 % (ref 0–1)
Lymphocytes Relative: 28 % (ref 12–46)
Monocytes Absolute: 0.4 10*3/uL (ref 0.1–1.0)
Monocytes Relative: 10 % (ref 3–12)
Neutro Abs: 2.4 10*3/uL (ref 1.7–7.7)

## 2012-02-12 LAB — TYPE AND SCREEN: ABO/RH(D): AB POS

## 2012-02-12 LAB — GLUCOSE, CAPILLARY: Glucose-Capillary: 130 mg/dL — ABNORMAL HIGH (ref 70–99)

## 2012-02-12 LAB — APTT: aPTT: 36 seconds (ref 24–37)

## 2012-02-12 LAB — OCCULT BLOOD, POC DEVICE: Fecal Occult Bld: POSITIVE

## 2012-02-12 MED ORDER — ACETAMINOPHEN 325 MG PO TABS: 650.0000 mg | ORAL_TABLET | Freq: Four times a day (QID) | ORAL | Status: DC | PRN

## 2012-02-12 MED ORDER — SODIUM CHLORIDE 0.9 % IV SOLN
INTRAVENOUS | Status: DC
  Administered 2012-02-12: 23:00:00 via INTRAVENOUS

## 2012-02-12 MED ORDER — SODIUM CHLORIDE 0.9 % IV BOLUS (SEPSIS)
500.0000 mL | Freq: Once | INTRAVENOUS | Status: AC
  Administered 2012-02-12: 500 mL via INTRAVENOUS

## 2012-02-12 MED ORDER — ATORVASTATIN CALCIUM 40 MG PO TABS
40.0000 mg | ORAL_TABLET | Freq: Every day | ORAL | Status: DC
  Filled 2012-02-12: qty 1

## 2012-02-12 MED ORDER — LOSARTAN POTASSIUM 50 MG PO TABS
100.0000 mg | ORAL_TABLET | Freq: Every day | ORAL | Status: DC
  Administered 2012-02-13: 100 mg via ORAL
  Filled 2012-02-12: qty 2

## 2012-02-12 MED ORDER — ONDANSETRON HCL 4 MG PO TABS: 4.0000 mg | ORAL_TABLET | Freq: Four times a day (QID) | ORAL | Status: DC | PRN

## 2012-02-12 MED ORDER — FINASTERIDE 5 MG PO TABS
5.0000 mg | ORAL_TABLET | Freq: Every day | ORAL | Status: DC | PRN
  Filled 2012-02-12: qty 1

## 2012-02-12 MED ORDER — METOPROLOL TARTRATE 50 MG PO TABS
50.0000 mg | ORAL_TABLET | Freq: Every day | ORAL | Status: DC
  Administered 2012-02-13: 50 mg via ORAL
  Filled 2012-02-12 (×2): qty 1

## 2012-02-12 MED ORDER — ONDANSETRON HCL 4 MG/2ML IJ SOLN: 4.0000 mg | Freq: Four times a day (QID) | INTRAMUSCULAR | Status: DC | PRN

## 2012-02-12 MED ORDER — SODIUM CHLORIDE 0.9 % IJ SOLN
3.0000 mL | Freq: Two times a day (BID) | INTRAMUSCULAR | Status: DC
  Administered 2012-02-12 – 2012-02-13 (×2): 3 mL via INTRAVENOUS

## 2012-02-12 MED ORDER — ACETAMINOPHEN 650 MG RE SUPP: 650.0000 mg | Freq: Four times a day (QID) | RECTAL | Status: DC | PRN

## 2012-02-12 MED ORDER — INSULIN ASPART 100 UNIT/ML ~~LOC~~ SOLN: 0.0000 [IU] | Freq: Three times a day (TID) | SUBCUTANEOUS | Status: DC

## 2012-02-12 MED ORDER — TRAMADOL HCL 50 MG PO TABS: 50.0000 mg | ORAL_TABLET | Freq: Four times a day (QID) | ORAL | Status: DC | PRN

## 2012-02-12 NOTE — ED Notes (Signed)
Pt resting in stretcher. Denies any needs at this time

## 2012-02-12 NOTE — ED Notes (Signed)
Rectal bleeding intermittent since he has had radiation for his prostate cancer 2 years ago.  Denies any pain pt. Describes it as bright red blood and is every time when he passes gas and is also passing clots

## 2012-02-12 NOTE — ED Notes (Signed)
MD at bedside for rectal exam

## 2012-02-12 NOTE — H&P (Signed)
Antonio [name] is an 66 y.o. male.   PCP - Dr.Sean Everardo All. Cardiologist - Dr.Jordan. Urologist - Dr.Dalstead. Chief Complaint: Rectal bleeding. HPI: 66 year-old male with known history of CAD status post CABG, hypertension, hyperlipidemia, diabetes mellitus2 presented to the ER because of rectal bleed. Patient states that he gets rectal bleed off and on but today he noticed more than usual blood bleeding and it was frank and when he stood up from the commode he had more blood coming out spontaneously. At that time he also felt dizzy. So he decided to the ER. Rectal exam by the ER physician was noticed to be having frank bleeding. He is admitted for further observation. Denies having used any recent antibiotics. Denies any abdominal pain, nausea or vomiting or fever chills. Denies chest pain or shortness of breath. Patient has had a colonoscopy in 2011 as per the charts. The results of which are not clear. Patient also had recent hematuria 3 weeks ago for which his urologist has done cystoscopy.  Past Medical History  Diagnosis Date  . 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 HYPERCHOLESTEROLEMIA 12/17/2010  . DM 12/17/2010  . HYPERTENSION 12/17/2010  . CAD 12/17/2010  . BENIGN PROSTATIC HYPERTROPHY, WITH OBSTRUCTION 12/17/2010  . Prostate cancer     s/p radiation     Past Surgical History  Procedure Date  . Cardiac catheterization 01/27/2009    ef 60%  . Coronary artery bypass graft 1996    LIMA GRAFT TO THE LAD, SAPHENOUS VEIN GRAFT SEQUENTIALLY TO THE FIRST AND SECOND OBTUSE MARGINAL VESSELS, SAPHENOUS VEIN GRAFT TO THE DIAGONAL, AND SAPHENOUS VEIN GRAFT TO THE DISTAL RIGHT CORONARY   . Hand surgery     LEFT HAND  . Foot surgery     LEFT  . Cervical spine surgery     Family History  Problem Relation Age of Onset  . Diabetes Other     2 siblings, 1 of whom is deceased  . Hypertension Other   . Cancer Other     Lung Cancer  . Cancer Other     FH of Prostate Cancer 1st degree relative    Social History:  reports that he quit smoking about 9 years ago. He does not have any smokeless tobacco history on file. He reports that he does not drink alcohol or use illicit drugs.  Allergies:  Allergies  Allergen Reactions  . Demerol Other (See Comments)    hallucinations  . Meperidine Hcl     Medications Prior to Admission  Medication Sig Dispense Refill  . aspirin 81 MG tablet Take 81 mg by mouth daily.        . finasteride (PROSCAR) 5 MG tablet Take 5 mg by mouth daily as needed. Constantly urinating      . glipiZIDE (GLUCOTROL XL) 10 MG 24 hr tablet Take 10 mg by mouth daily.        Marland Kitchen losartan-hydrochlorothiazide (HYZAAR) 100-25 MG per tablet Take 1 tablet by mouth daily.      . metFORMIN (GLUCOPHAGE-XR) 500 MG 24 hr tablet Take 1,000 mg by mouth daily.      . metoprolol (LOPRESSOR) 50 MG tablet Take 50 mg by mouth daily.      . Multiple Vitamin (MULTIVITAMIN) tablet Take 1 tablet by mouth daily.        . prasugrel (EFFIENT) 10 MG TABS Take 1 tablet (10 mg total) by mouth daily.  30 tablet  5  . simvastatin (ZOCOR) 80 MG tablet Take 40 mg  by mouth at bedtime.        . traMADol (ULTRAM) 50 MG tablet Take 1 tablet (50 mg total) by mouth every 6 (six) hours as needed for pain.  50 tablet  3    Results for orders placed during the hospital encounter of 02/12/12 (from the past 48 hour(s))  OCCULT BLOOD, POC DEVICE     Status: Normal   Collection Time   02/12/12  5:34 PM      Component Value Range Comment   Fecal Occult Bld POSITIVE     CBC     Status: Abnormal   Collection Time   02/12/12  5:51 PM      Component Value Range Comment   WBC 4.3  4.0 - 10.5 (K/uL)    RBC 4.53  4.22 - 5.81 (MIL/uL)    Hemoglobin 12.9 (*) 13.0 - 17.0 (g/dL)    HCT 62.1 (*) 30.8 - 52.0 (%)    MCV 83.9  78.0 - 100.0 (fL)    MCH 28.5  26.0 - 34.0 (pg)    MCHC 33.9  30.0 - 36.0 (g/dL)    RDW 65.7  84.6 - 96.2 (%)    Platelets 145 (*) 150 - 400 (K/uL)   DIFFERENTIAL     Status: Abnormal    Collection Time   02/12/12  5:51 PM      Component Value Range Comment   Neutrophils Relative 56  43 - 77 (%)    Neutro Abs 2.4  1.7 - 7.7 (K/uL)    Lymphocytes Relative 28  12 - 46 (%)    Lymphs Abs 1.2  0.7 - 4.0 (K/uL)    Monocytes Relative 10  3 - 12 (%)    Monocytes Absolute 0.4  0.1 - 1.0 (K/uL)    Eosinophils Relative 6 (*) 0 - 5 (%)    Eosinophils Absolute 0.2  0.0 - 0.7 (K/uL)    Basophils Relative 0  0 - 1 (%)    Basophils Absolute 0.0  0.0 - 0.1 (K/uL)   PROTIME-INR     Status: Normal   Collection Time   02/12/12  5:51 PM      Component Value Range Comment   Prothrombin Time 13.8  11.6 - 15.2 (seconds)    INR 1.04  0.00 - 1.49    APTT     Status: Normal   Collection Time   02/12/12  5:51 PM      Component Value Range Comment   aPTT 36  24 - 37 (seconds)   COMPREHENSIVE METABOLIC PANEL     Status: Abnormal   Collection Time   02/12/12  5:51 PM      Component Value Range Comment   Sodium 139  135 - 145 (mEq/L)    Potassium 3.5  3.5 - 5.1 (mEq/L)    Chloride 105  96 - 112 (mEq/L)    CO2 26  19 - 32 (mEq/L)    Glucose, Bld 166 (*) 70 - 99 (mg/dL)    BUN 12  6 - 23 (mg/dL)    Creatinine, Ser 9.52  0.50 - 1.35 (mg/dL)    Calcium 8.8  8.4 - 10.5 (mg/dL)    Total Protein 6.4  6.0 - 8.3 (g/dL)    Albumin 3.6  3.5 - 5.2 (g/dL)    AST 28  0 - 37 (U/L)    ALT 21  0 - 53 (U/L)    Alkaline Phosphatase 56  39 - 117 (U/L)  Total Bilirubin 0.3  0.3 - 1.2 (mg/dL)    GFR calc non Af Amer 55 (*) >90 (mL/min)    GFR calc Af Amer 64 (*) >90 (mL/min)   TYPE AND SCREEN     Status: Normal   Collection Time   02/12/12  5:56 PM      Component Value Range Comment   ABO/RH(D) AB POS      Antibody Screen NEG      Sample Expiration 02/15/2012     CBC     Status: Abnormal   Collection Time   02/12/12  7:47 PM      Component Value Range Comment   WBC 4.1  4.0 - 10.5 (K/uL)    RBC 4.50  4.22 - 5.81 (MIL/uL)    Hemoglobin 13.0  13.0 - 17.0 (g/dL)    HCT 16.1 (*) 09.6 - 52.0 (%)    MCV 84.4   78.0 - 100.0 (fL)    MCH 28.9  26.0 - 34.0 (pg)    MCHC 34.2  30.0 - 36.0 (g/dL)    RDW 04.5  40.9 - 81.1 (%)    Platelets 146 (*) 150 - 400 (K/uL)    No results found.  Review of Systems  Constitutional: Negative.   HENT: Negative.   Eyes: Negative.   Respiratory: Negative.   Cardiovascular: Negative.   Gastrointestinal:       Rectal bleeding.  Genitourinary: Negative.   Musculoskeletal: Negative.   Skin: Negative.   Neurological: Negative.   Endo/Heme/Allergies: Negative.   Psychiatric/Behavioral: Negative.     Blood pressure 136/96, pulse 63, temperature 97.9 F (36.6 C), temperature source Oral, resp. rate 16, height 5\' 7"  (1.702 m), weight 83.6 kg (184 lb 4.9 oz), SpO2 99.00%. Physical Exam  Constitutional: He is oriented to person, place, and time. He appears well-developed and well-nourished. No distress.  HENT:  Head: Normocephalic and atraumatic.  Right Ear: External ear normal.  Left Ear: External ear normal.  Mouth/Throat: No oropharyngeal exudate.  Eyes: Conjunctivae are normal. Pupils are equal, round, and reactive to light. Right eye exhibits no discharge. Left eye exhibits no discharge. No scleral icterus.  Neck: Normal range of motion. Neck supple.  Cardiovascular: Normal rate and regular rhythm.   Respiratory: Effort normal and breath sounds normal.  GI: Soft. Bowel sounds are normal.  Musculoskeletal: Normal range of motion. He exhibits no edema and no tenderness.  Neurological: He is alert and oriented to person, place, and time.       Moves all extremities.  Skin: Skin is warm and dry. No rash noted. He is not diaphoretic. No erythema.  Psychiatric: His behavior is normal.     Assessment/Plan #1. Rectal bleeding - we will observe patient in step down as patient was feeling dizzy during the episode. Repeat CBC and transfuse if needed. Consult GI in a.m. For now we will hold off antiplatelet agents. #2. Diabetes mellitus2 - will keep patient on sliding  scale coverage. #3. Hypertension - continue present medications except for HCTZ. #4. CAD status post CABG - presently has no chest pain. Hold antiplatelet agents due to rectal bleed. #5. Hyperlipidemia - continue present medications. #6. History of prostate cancer status post radiation treatment 2 years ago.  CODE STATUS - full code.  Eduard Clos 02/12/2012, 9:54 PM

## 2012-02-12 NOTE — ED Notes (Signed)
EKG shown to MD, copies in the chart

## 2012-02-12 NOTE — ED Provider Notes (Addendum)
History     CSN: [id number]  Arrival date & time 02/12/12  1647   First MD Initiated Contact with Patient 02/12/12 1714      Chief Complaint  Patient presents with  . Rectal Bleeding    (Consider location/radiation/quality/duration/timing/severity/associated sxs/prior treatment) HPI Pt p/w BRBPR starting this AM. Notes he has passed several clots. No fever, chills, abd pain. Pt has had previous episode due to radiation of prostate. He has mild dizziness when standing up. No CP, SOB.   Past Medical History  Diagnosis Date  . 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 HYPERCHOLESTEROLEMIA 12/17/2010  . DM 12/17/2010  . HYPERTENSION 12/17/2010  . CAD 12/17/2010  . BENIGN PROSTATIC HYPERTROPHY, WITH OBSTRUCTION 12/17/2010  . Prostate cancer     s/p radiation     Past Surgical History  Procedure Date  . Cardiac catheterization 01/27/2009    ef 60%  . Coronary artery bypass graft 1996    LIMA GRAFT TO THE LAD, SAPHENOUS VEIN GRAFT SEQUENTIALLY TO THE FIRST AND SECOND OBTUSE MARGINAL VESSELS, SAPHENOUS VEIN GRAFT TO THE DIAGONAL, AND SAPHENOUS VEIN GRAFT TO THE DISTAL RIGHT CORONARY   . Hand surgery     LEFT HAND  . Foot surgery     LEFT  . Cervical spine surgery     Family History  Problem Relation Age of Onset  . Diabetes Other     2 siblings, 1 of whom is deceased  . Hypertension Other   . Cancer Other     Lung Cancer  . Cancer Other     FH of Prostate Cancer 1st degree relative    History  Substance Use Topics  . Smoking status: Former Smoker    Quit date: 10/14/2002  . Smokeless tobacco: Not on file  . Alcohol Use: No     x 15 years      Review of Systems  Constitutional: Negative for fever and chills.  Eyes: Negative for visual disturbance.  Respiratory: Positive for chest tightness. Negative for shortness of breath.   Cardiovascular: Negative for chest pain and palpitations.  Gastrointestinal: Positive for blood in stool. Negative for nausea, vomiting, abdominal  pain, diarrhea and constipation.  Genitourinary: Negative for hematuria.  Musculoskeletal: Negative for back pain.  Skin: Negative for rash and wound.  Neurological: Positive for dizziness and light-headedness. Negative for weakness and numbness.    Allergies  Demerol and Meperidine hcl  Home Medications   Current Outpatient Rx  Name Route Sig Dispense Refill  . ASPIRIN 81 MG PO TABS Oral Take 81 mg by mouth daily.      Marland Kitchen FINASTERIDE 5 MG PO TABS Oral Take 5 mg by mouth daily as needed. Constantly urinating    . GLIPIZIDE ER 10 MG PO TB24 Oral Take 10 mg by mouth daily.      Marland Kitchen LOSARTAN POTASSIUM-HCTZ 100-25 MG PO TABS Oral Take 1 tablet by mouth daily.    Marland Kitchen METFORMIN HCL ER 500 MG PO TB24 Oral Take 1,000 mg by mouth daily.    Marland Kitchen METOPROLOL TARTRATE 50 MG PO TABS Oral Take 50 mg by mouth daily.    Marland Kitchen ONE-DAILY MULTI VITAMINS PO TABS Oral Take 1 tablet by mouth daily.      Marland Kitchen PRASUGREL HCL 10 MG PO TABS Oral Take 1 tablet (10 mg total) by mouth daily. 30 tablet 5  . SIMVASTATIN 80 MG PO TABS Oral Take 40 mg by mouth at bedtime.      . TRAMADOL HCL 50  MG PO TABS Oral Take 1 tablet (50 mg total) by mouth every 6 (six) hours as needed for pain. 50 tablet 3    BP 108/77  Pulse 64  Temp(Src) 98.6 F (37 C) (Oral)  Resp 18  SpO2 98%  Physical Exam  Nursing note and vitals reviewed. Constitutional: He is oriented to person, place, and time. He appears well-developed and well-nourished. No distress.  HENT:  Head: Normocephalic and atraumatic.  Mouth/Throat: Oropharynx is clear and moist.  Eyes: EOM are normal. Pupils are equal, round, and reactive to light.  Neck: Normal range of motion. Neck supple.  Cardiovascular: Normal rate and regular rhythm.   Pulmonary/Chest: Effort normal and breath sounds normal. No respiratory distress. He has no wheezes. He has no rales.  Abdominal: Soft. Bowel sounds are normal. There is no tenderness. There is no rebound and no guarding.  Musculoskeletal:  Normal range of motion. He exhibits no edema and no tenderness.  Neurological: He is alert and oriented to person, place, and time.  Skin: Skin is warm and dry. No rash noted. No erythema.  Psychiatric: He has a normal mood and affect. His behavior is normal.    ED Course  Procedures (including critical care time)  Labs Reviewed  CBC - Abnormal; Notable for the following:    Hemoglobin 12.9 (*)    HCT 38.0 (*)    Platelets 145 (*)    All other components within normal limits  DIFFERENTIAL - Abnormal; Notable for the following:    Eosinophils Relative 6 (*)    All other components within normal limits  COMPREHENSIVE METABOLIC PANEL - Abnormal; Notable for the following:    Glucose, Bld 166 (*)    GFR calc non Af Amer 55 (*)    GFR calc Af Amer 64 (*)    All other components within normal limits  OCCULT BLOOD, POC DEVICE  PROTIME-INR  APTT  TYPE AND SCREEN  CBC   No results found.   1. Lower GI bleeding     Date: 02/12/2012  Rate: 63  Rhythm: normal sinus rhythm  QRS Axis: normal  Intervals: normal  ST/T Wave abnormalities: nonspecific T wave changes  Conduction Disutrbances:none  Narrative Interpretation:   Old EKG Reviewed: changes noted     MDM  Discussed with triad. Will admit to step down        Loren Racer, MD 02/12/12 1945  Loren Racer, MD 02/12/12 1610  Loren Racer, MD 04/18/12 [phone]

## 2012-02-13 DIAGNOSIS — I2510 Atherosclerotic heart disease of native coronary artery without angina pectoris: Secondary | ICD-10-CM

## 2012-02-13 DIAGNOSIS — I10 Essential (primary) hypertension: Secondary | ICD-10-CM

## 2012-02-13 DIAGNOSIS — K625 Hemorrhage of anus and rectum: Secondary | ICD-10-CM

## 2012-02-13 DIAGNOSIS — E1165 Type 2 diabetes mellitus with hyperglycemia: Secondary | ICD-10-CM

## 2012-02-13 LAB — CBC
HCT: 39.6 % (ref 39.0–52.0)
Hemoglobin: 13.8 g/dL (ref 13.0–17.0)
MCH: 28.7 pg (ref 26.0–34.0)
MCH: 29.5 pg (ref 26.0–34.0)
MCHC: 33.9 g/dL (ref 30.0–36.0)
MCHC: 34.8 g/dL (ref 30.0–36.0)
MCV: 84.6 fL (ref 78.0–100.0)
Platelets: 134 10*3/uL — ABNORMAL LOW (ref 150–400)
RBC: 4.68 MIL/uL (ref 4.22–5.81)

## 2012-02-13 LAB — COMPREHENSIVE METABOLIC PANEL
ALT: 20 U/L (ref 0–53)
AST: 23 U/L (ref 0–37)
Calcium: 8.7 mg/dL (ref 8.4–10.5)
GFR calc Af Amer: 63 mL/min — ABNORMAL LOW (ref >90)
Sodium: 141 mEq/L (ref 135–145)
Total Protein: 5.8 g/dL — ABNORMAL LOW (ref 6.0–8.3)

## 2012-02-13 LAB — GLUCOSE, CAPILLARY: Glucose-Capillary: 107 mg/dL — ABNORMAL HIGH (ref 70–99)

## 2012-02-13 NOTE — Discharge Summary (Addendum)
DISCHARGE SUMMARY  Antonio [name]  MR#: [id number]  DOB:07-Mar-1946  Date of Admission: 02/12/2012 Date of Discharge: 02/13/2012  Attending Physician:Railynn Ballo  Patient's PCP:No primary provider on file.  Consults: none  Presenting Complaint: Bleeding per rectum  Discharge Diagnoses: Principal Problem:  *Lower GI bleeding Active Problems:  DM  HYPERTENSION  CAD    Discharge Medications: Medication List  As of 02/13/2012  4:12 PM   TAKE these medications         aspirin 81 MG tablet   Take 81 mg by mouth daily.      finasteride 5 MG tablet   Commonly known as: PROSCAR   Take 5 mg by mouth daily as needed. Constantly urinating      glipiZIDE 10 MG 24 hr tablet   Commonly known as: GLUCOTROL XL   Take 10 mg by mouth daily.      losartan-hydrochlorothiazide 100-25 MG per tablet   Commonly known as: HYZAAR   Take 1 tablet by mouth daily.      metFORMIN 500 MG 24 hr tablet   Commonly known as: GLUCOPHAGE-XR   Take 1,000 mg by mouth daily.      metoprolol 50 MG tablet   Commonly known as: LOPRESSOR   Take 50 mg by mouth daily.      multivitamin tablet   Take 1 tablet by mouth daily.      prasugrel 10 MG Tabs   Commonly known as: EFFIENT   Take 1 tablet (10 mg total) by mouth daily.      simvastatin 80 MG tablet   Commonly known as: ZOCOR   Take 40 mg by mouth at bedtime.      traMADol 50 MG tablet   Commonly known as: ULTRAM   Take 1 tablet (50 mg total) by mouth every 6 (six) hours as needed for pain.             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 He usually has 2-3 BMs a day and is not always constipated. He states he has bleeding every 1-2 weeks especially if he has hard  stool. His bleeding yesterday was not prompted by constipation. It was not much blood and he noticed a 1-2 cm clot when he wiped. He also felt dizzy at the time. He had small amount of blood noted in the ER when he was taking off his jeans. He has not had any further bleeding and I have check his bed and no blood is noted on pad.  We have checked multiple CBCs, the last one just a couple of hours ago. Hgb on arrival was 12.9 and is 13.8 on d/c. Pt was being hydrated on was on clear liquids. I have advanced him to a regular diet and checked orthostatics which are negative.   CAD He is on Effient and ASA (due to CAD s/p CABG) which I have asked him to continue unless he has significant bleeding.    Day of Discharge Physical Exam: BP 137/95  Pulse 61  Temp(Src) 98 F (36.7 C) (Oral)  Resp 21  Ht 5\' 7"  (1.702 m)  Wt 83.6 kg (184 lb 4.9 oz)  BMI 28.87 kg/m2  SpO2 100% Respiratory: Effort normal and breath sounds normal.  GI: Soft.  Bowel sounds are normal.  CVS: RRR  Musculoskeletal: Normal range of motion. He exhibits no edema and no tenderness.  Neurological: He is alert and oriented to person, place, and time.  Moves all extremities.    Results for orders placed during the hospital encounter of 02/12/12 (from the past 24 hour(s))  OCCULT BLOOD, POC DEVICE     Status: Normal   Collection Time   02/12/12  5:34 PM      Component Value Range   Fecal Occult Bld POSITIVE    CBC     Status: Abnormal   Collection Time   02/12/12  5:51 PM      Component Value Range   WBC 4.3  4.0 - 10.5 (K/uL)   RBC 4.53  4.22 - 5.81 (MIL/uL)   Hemoglobin 12.9 (*) 13.0 - 17.0 (g/dL)   HCT 16.1 (*) 09.6 - 52.0 (%)   MCV 83.9  78.0 - 100.0 (fL)   MCH 28.5  26.0 - 34.0 (pg)   MCHC 33.9  30.0 - 36.0 (g/dL)   RDW 04.5  40.9 - 81.1 (%)   Platelets 145 (*) 150 - 400 (K/uL)  DIFFERENTIAL     Status: Abnormal   Collection Time   02/12/12  5:51 PM      Component Value Range   Neutrophils Relative 56  43 - 77 (%)     Neutro Abs 2.4  1.7 - 7.7 (K/uL)   Lymphocytes Relative 28  12 - 46 (%)   Lymphs Abs 1.2  0.7 - 4.0 (K/uL)   Monocytes Relative 10  3 - 12 (%)   Monocytes Absolute 0.4  0.1 - 1.0 (K/uL)   Eosinophils Relative 6 (*) 0 - 5 (%)   Eosinophils Absolute 0.2  0.0 - 0.7 (K/uL)   Basophils Relative 0  0 - 1 (%)   Basophils Absolute 0.0  0.0 - 0.1 (K/uL)  PROTIME-INR     Status: Normal   Collection Time   02/12/12  5:51 PM      Component Value Range   Prothrombin Time 13.8  11.6 - 15.2 (seconds)   INR 1.04  0.00 - 1.49   APTT     Status: Normal   Collection Time   02/12/12  5:51 PM      Component Value Range   aPTT 36  24 - 37 (seconds)  COMPREHENSIVE METABOLIC PANEL     Status: Abnormal   Collection Time   02/12/12  5:51 PM      Component Value Range   Sodium 139  135 - 145 (mEq/L)   Potassium 3.5  3.5 - 5.1 (mEq/L)   Chloride 105  96 - 112 (mEq/L)   CO2 26  19 - 32 (mEq/L)   Glucose, Bld 166 (*) 70 - 99 (mg/dL)   BUN 12  6 - 23 (mg/dL)   Creatinine, Ser 9.14  0.50 - 1.35 (mg/dL)   Calcium 8.8  8.4 - 78.2 (mg/dL)   Total Protein 6.4  6.0 - 8.3 (g/dL)   Albumin 3.6  3.5 - 5.2 (g/dL)   AST 28  0 - 37 (U/L)   ALT 21  0 - 53 (U/L)   Alkaline Phosphatase 56  39 - 117 (U/L)   Total Bilirubin 0.3  0.3 - 1.2 (mg/dL)   GFR calc non Af Amer 55 (*) >90 (mL/min)   GFR calc Af Amer 64 (*) >90 (mL/min)  TYPE AND SCREEN     Status: Normal   Collection Time  02/12/12  5:56 PM      Component Value Range   ABO/RH(D) AB POS     Antibody Screen NEG     Sample Expiration 02/15/2012    CBC     Status: Abnormal   Collection Time   02/12/12  7:47 PM      Component Value Range   WBC 4.1  4.0 - 10.5 (K/uL)   RBC 4.50  4.22 - 5.81 (MIL/uL)   Hemoglobin 13.0  13.0 - 17.0 (g/dL)   HCT 40.9 (*) 81.1 - 52.0 (%)   MCV 84.4  78.0 - 100.0 (fL)   MCH 28.9  26.0 - 34.0 (pg)   MCHC 34.2  30.0 - 36.0 (g/dL)   RDW 91.4  78.2 - 95.6 (%)   Platelets 146 (*) 150 - 400 (K/uL)  MRSA PCR SCREENING     Status: Normal    Collection Time   02/12/12  9:07 PM      Component Value Range   MRSA by PCR NEGATIVE  NEGATIVE   GLUCOSE, CAPILLARY     Status: Abnormal   Collection Time   02/12/12 10:35 PM      Component Value Range   Glucose-Capillary 130 (*) 70 - 99 (mg/dL)   Comment 1 Documented in Chart     Comment 2 Notify RN    CBC     Status: Abnormal   Collection Time   02/13/12  4:10 AM      Component Value Range   WBC 4.2  4.0 - 10.5 (K/uL)   RBC 4.60  4.22 - 5.81 (MIL/uL)   Hemoglobin 13.2  13.0 - 17.0 (g/dL)   HCT 21.3 (*) 08.6 - 52.0 (%)   MCV 84.6  78.0 - 100.0 (fL)   MCH 28.7  26.0 - 34.0 (pg)   MCHC 33.9  30.0 - 36.0 (g/dL)   RDW 57.8  46.9 - 62.9 (%)   Platelets 134 (*) 150 - 400 (K/uL)  COMPREHENSIVE METABOLIC PANEL     Status: Abnormal   Collection Time   02/13/12  4:10 AM      Component Value Range   Sodium 141  135 - 145 (mEq/L)   Potassium 3.6  3.5 - 5.1 (mEq/L)   Chloride 107  96 - 112 (mEq/L)   CO2 26  19 - 32 (mEq/L)   Glucose, Bld 109 (*) 70 - 99 (mg/dL)   BUN 12  6 - 23 (mg/dL)   Creatinine, Ser 5.28  0.50 - 1.35 (mg/dL)   Calcium 8.7  8.4 - 41.3 (mg/dL)   Total Protein 5.8 (*) 6.0 - 8.3 (g/dL)   Albumin 3.2 (*) 3.5 - 5.2 (g/dL)   AST 23  0 - 37 (U/L)   ALT 20  0 - 53 (U/L)   Alkaline Phosphatase 52  39 - 117 (U/L)   Total Bilirubin 0.3  0.3 - 1.2 (mg/dL)   GFR calc non Af Amer 54 (*) >90 (mL/min)   GFR calc Af Amer 63 (*) >90 (mL/min)  GLUCOSE, CAPILLARY     Status: Abnormal   Collection Time   02/13/12  7:56 AM      Component Value Range   Glucose-Capillary 116 (*) 70 - 99 (mg/dL)   Comment 1 Notify RN    GLUCOSE, CAPILLARY     Status: Abnormal   Collection Time   02/13/12 12:52 PM      Component Value Range   Glucose-Capillary 107 (*) 70 - 99 (mg/dL)   Comment 1  Notify RN    CBC     Status: Abnormal   Collection Time   02/13/12  1:39 PM      Component Value Range   WBC 4.8  4.0 - 10.5 (K/uL)   RBC 4.68  4.22 - 5.81 (MIL/uL)   Hemoglobin 13.8  13.0 - 17.0 (g/dL)    HCT 78.2  95.6 - 21.3 (%)   MCV 84.6  78.0 - 100.0 (fL)   MCH 29.5  26.0 - 34.0 (pg)   MCHC 34.8  30.0 - 36.0 (g/dL)   RDW 08.6  57.8 - 46.9 (%)   Platelets 149 (*) 150 - 400 (K/uL)    Disposition: stable for d/c to home  Follow-up Appts: Discharge Orders    Future Orders Please Complete By Expires   Diet - low sodium heart healthy      Increase activity slowly      Discharge instructions      Comments:   Return to hospital if bleeding is severe.      Follow-up with  Romero Belling in 2 wks.    Time on Discharge: >45 min Signed: Brittanya Winburn 02/13/2012, 4:12 PM

## 2012-02-17 ENCOUNTER — Telehealth: Payer: Self-pay | Admitting: Cardiology

## 2012-02-17 NOTE — Telephone Encounter (Signed)
Patient called was told office out of effient samples. 

## 2012-02-17 NOTE — Telephone Encounter (Signed)
Pt needs samples of effient , pls call [phone]

## 2012-02-19 ENCOUNTER — Other Ambulatory Visit: Payer: Self-pay | Admitting: Endocrinology

## 2012-02-21 ENCOUNTER — Other Ambulatory Visit: Payer: Self-pay | Admitting: Cardiology

## 2012-03-24 ENCOUNTER — Other Ambulatory Visit: Payer: Self-pay

## 2012-03-24 MED ORDER — TRAMADOL HCL 50 MG PO TABS: 50.0000 mg | ORAL_TABLET | Freq: Four times a day (QID) | ORAL | Status: DC | PRN

## 2012-03-24 NOTE — Telephone Encounter (Signed)
Script filled.

## 2012-04-13 ENCOUNTER — Telehealth: Payer: Self-pay | Admitting: Cardiology

## 2012-04-13 NOTE — Telephone Encounter (Signed)
Patient called no answer.Left message on personal voice mail we did not currently have any samples of effient.

## 2012-04-13 NOTE — Telephone Encounter (Signed)
New msg Pt wants some samples of effient

## 2012-04-14 ENCOUNTER — Telehealth: Payer: Self-pay | Admitting: Cardiology

## 2012-04-14 NOTE — Telephone Encounter (Signed)
Patient called was told found effient copay savings card will leave at front desk 3rd floor.

## 2012-04-14 NOTE — Telephone Encounter (Signed)
Patient called was told office out of effient samples.Patient stated he can afford effient.

## 2012-04-14 NOTE — Telephone Encounter (Signed)
New Problem:    Patient called in wanting to know if there were any samples of prasugrel (EFFIENT) 10 MG TABS available for him to have.  Please call back.

## 2012-04-24 ENCOUNTER — Telehealth: Payer: Self-pay | Admitting: Cardiology

## 2012-04-24 NOTE — Telephone Encounter (Signed)
A 28/10 mg effient sample pills placed at the front desk patient aware.

## 2012-04-24 NOTE — Telephone Encounter (Signed)
New msg Pt wants effient samples. Please let him know

## 2012-05-15 ENCOUNTER — Telehealth: Payer: Self-pay | Admitting: Cardiology

## 2012-05-15 NOTE — Telephone Encounter (Signed)
New problem:  Per Arline Asp calling from [facility] , patient is enrolled in the case management program. Per Arline Asp no need for a telephone call back unless the nurse / MD has questions

## 2012-06-12 NOTE — Telephone Encounter (Signed)
New problem:  Samples of Effient 10 mg.

## 2012-06-12 NOTE — Telephone Encounter (Signed)
Patient called was told samples of effient 10 mg left at front desk 3rd floor.

## 2012-07-02 ENCOUNTER — Telehealth: Payer: Self-pay | Admitting: Cardiology

## 2012-07-02 NOTE — Telephone Encounter (Signed)
plz return call to patient regarding effient samples.

## 2012-07-02 NOTE — Telephone Encounter (Signed)
Patient called samples of Effient 10 mg left at front desk 3rd floor.

## 2012-07-13 ENCOUNTER — Other Ambulatory Visit: Payer: Self-pay | Admitting: *Deleted

## 2012-07-13 ENCOUNTER — Encounter: Payer: Self-pay | Admitting: *Deleted

## 2012-07-13 ENCOUNTER — Telehealth: Payer: Self-pay | Admitting: Cardiology

## 2012-07-13 MED ORDER — METFORMIN HCL ER 500 MG PO TB24: 1000.0000 mg | ORAL_TABLET | Freq: Every day | ORAL | Status: DC

## 2012-07-13 NOTE — Telephone Encounter (Signed)
Metformin refilled per request

## 2012-07-13 NOTE — Telephone Encounter (Signed)
Pt calling re diabetic med, requesting call before 5p

## 2012-07-14 ENCOUNTER — Emergency Department (HOSPITAL_COMMUNITY)
Admission: EM | Admit: 2012-07-14 | Discharge: 2012-07-15 | Discharge status: Against Medical Advice | Payer: BC Managed Care – PPO | Attending: Emergency Medicine | Admitting: Emergency Medicine

## 2012-07-14 ENCOUNTER — Encounter (HOSPITAL_COMMUNITY): Payer: Self-pay | Admitting: Family Medicine

## 2012-07-14 DIAGNOSIS — R0789 Other chest pain: Secondary | ICD-10-CM | POA: Insufficient documentation

## 2012-07-14 LAB — CBC
HCT: 44.9 % (ref 39.0–52.0)
Hemoglobin: 15.9 g/dL (ref 13.0–17.0)
MCH: 29.8 pg (ref 26.0–34.0)
MCHC: 35.4 g/dL (ref 30.0–36.0)
MCV: 84.2 fL (ref 78.0–100.0)
RBC: 5.33 MIL/uL (ref 4.22–5.81)

## 2012-07-14 LAB — BASIC METABOLIC PANEL
BUN: 12 mg/dL (ref 6–23)
CO2: 25 mEq/L (ref 19–32)
Calcium: 9 mg/dL (ref 8.4–10.5)
Glucose, Bld: 251 mg/dL — ABNORMAL HIGH (ref 70–99)
Sodium: 139 mEq/L (ref 135–145)

## 2012-07-14 NOTE — ED Notes (Signed)
Pt reports drinking 2- 40s of beer today.

## 2012-07-14 NOTE — ED Notes (Signed)
Pt reports left sided chest pain radiating down left and right arm starting at 21:00 tonight. Reports sob, nausea, diaphoresis.

## 2012-07-15 LAB — TROPONIN I: Troponin I: <0.3 ng/mL (ref <0.30)

## 2012-08-14 ENCOUNTER — Other Ambulatory Visit: Payer: Self-pay | Admitting: Cardiology

## 2012-08-21 ENCOUNTER — Encounter: Payer: Self-pay | Admitting: Cardiology

## 2012-08-21 ENCOUNTER — Ambulatory Visit (INDEPENDENT_AMBULATORY_CARE_PROVIDER_SITE_OTHER): Payer: BC Managed Care – PPO | Admitting: Cardiology

## 2012-08-21 VITALS — BP 120/84 | HR 67 | Ht 67.0 in | Wt 182.2 lb

## 2012-08-21 DIAGNOSIS — I10 Essential (primary) hypertension: Secondary | ICD-10-CM

## 2012-08-21 DIAGNOSIS — E7800 Pure hypercholesterolemia, unspecified: Secondary | ICD-10-CM

## 2012-08-21 DIAGNOSIS — I2510 Atherosclerotic heart disease of native coronary artery without angina pectoris: Secondary | ICD-10-CM

## 2012-08-21 DIAGNOSIS — I209 Angina pectoris, unspecified: Secondary | ICD-10-CM

## 2012-08-21 MED ORDER — NITROGLYCERIN 0.4 MG SL SUBL: 0.4000 mg | SUBLINGUAL_TABLET | SUBLINGUAL | Status: DC | PRN

## 2012-08-21 NOTE — Patient Instructions (Addendum)
Continue your current therapy. You can try a sublingual Ntg prior to exertion to see if this will help your chest pain.  I will see you again in 6 months.

## 2012-08-21 NOTE — Progress Notes (Signed)
Antonio [name] Date of Birth: September 25, 1946   History of Present Illness: Antonio [name] is seen today for followup today. He reports that over the past 8 months he has had some left precordial chest pain when he first starts walking or when he does any lifting. Pain then goes away. He is able to then complete his activity without further trouble. He does have some shortness of breath related to this. He does a lot of lifting at work up to 90 pounds. He is not sure whether this is heart or muscular related. He states he got a good report from his cancer doctor.  Current Outpatient Prescriptions on File Prior to Visit  Medication Sig Dispense Refill  . aspirin 81 MG tablet Take 81 mg by mouth daily.        . Aspirin-Acetaminophen (GOODYS BODY PAIN PO) Take 1 packet by mouth 2 (two) times daily as needed. For pain      . losartan-hydrochlorothiazide (HYZAAR) 100-25 MG per tablet Take 1 tablet by mouth daily.      . metFORMIN (GLUCOPHAGE-XR) 500 MG 24 hr tablet Take 500 mg by mouth daily.      . metoprolol (LOPRESSOR) 50 MG tablet Take 50 mg by mouth daily.      . Multiple Vitamin (MULTIVITAMIN) tablet Take 1 tablet by mouth daily.        . naproxen sodium (ANAPROX) 220 MG tablet Take 220 mg by mouth 2 (two) times daily as needed. For pain      . prasugrel (EFFIENT) 10 MG TABS Take 1 tablet (10 mg total) by mouth daily.  30 tablet  5  . simvastatin (ZOCOR) 80 MG tablet Take 0.5 tablets (40 mg total) by mouth at bedtime.  90 tablet  1  . traMADol (ULTRAM) 50 MG tablet Take 1 tablet (50 mg total) by mouth every 6 (six) hours as needed for pain.  50 tablet  4  . nitroGLYCERIN (NITROSTAT) 0.4 MG SL tablet Place 1 tablet (0.4 mg total) under the tongue every 5 (five) minutes as needed for chest pain.  90 tablet  3  . testosterone cypionate (DEPOTESTOTERONE CYPIONATE) 200 MG/ML injection         Allergies  Allergen Reactions  . Demerol Other (See Comments)    hallucinations    Past Medical History    Diagnosis Date  . 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 HYPERCHOLESTEROLEMIA 12/17/2010  . DM 12/17/2010  . HYPERTENSION 12/17/2010  . CAD 12/17/2010  . BENIGN PROSTATIC HYPERTROPHY, WITH OBSTRUCTION 12/17/2010  . Prostate cancer     s/p radiation     Past Surgical History  Procedure Date  . Cardiac catheterization 01/27/2009    ef 60%  . Coronary artery bypass graft 1996    LIMA GRAFT TO THE LAD, SAPHENOUS VEIN GRAFT SEQUENTIALLY TO THE FIRST AND SECOND OBTUSE MARGINAL VESSELS, SAPHENOUS VEIN GRAFT TO THE DIAGONAL, AND SAPHENOUS VEIN GRAFT TO THE DISTAL RIGHT CORONARY   . Hand surgery     LEFT HAND  . Foot surgery     LEFT  . Cervical spine surgery     History  Smoking status  . Former Smoker  . Quit date: 10/14/2002  Smokeless tobacco  . Not on file    History  Alcohol Use  . Yes    Comment: x 15 years    Family History  Problem Relation Age of Onset  . Diabetes Other     2 siblings, 1 of whom is deceased  .  Hypertension Other   . Cancer Other     Lung Cancer  . Cancer Other     FH of Prostate Cancer 1st degree relative    Review of Systems: The review of systems is positive for erectile dysfunction.  He is on testosterone injections and notes a significant improvement in his fatigue on this medication. All other systems were reviewed and are negative.  Physical Exam: BP 120/84  Pulse 67  Ht 5\' 7"  (1.702 m)  Wt 182 lb 3.2 oz (82.645 kg)  BMI 28.54 kg/m2  SpO2 98% He is a pleasant overweight black male in no acute distress.The patient is alert and oriented x 3.   The HEENT exam is unremarkable. The carotids are 2+ without bruits.  There is no thyromegaly.  There is no JVD.  The lungs are clear.  The chest wall is non tender.  The heart exam reveals a regular rate with a normal S1 and S2.  There are no murmurs, gallops, or rubs.  The PMI is not displaced.   Abdominal exam reveals good bowel sounds.  There is no guarding or rebound.  There is no hepatosplenomegaly  or tenderness.  There are no masses.  Exam of the legs reveal no clubbing, cyanosis, or edema.  The legs are without rashes.  The distal pulses are intact.  Cranial nerves II - XII are intact.  Motor and sensory functions are intact.  The gait is normal. LABORATORY DATA:   Assessment / Plan: 1. Angina pectoris. His history is consistent with some walk through angina. His symptoms have been stable. I recommended he try low sublingual nitroglycerin prior to exertion to see if this will help. This does not help then it may point to more of a musculoskeletal pain. He is to notify me if he has any worsening symptoms of chest pain.  2. Coronary disease status post CABG. Status post PCI of the saphenous vein graft to the right coronary. In a normal Myoview study one year ago.  3. Hypertension, controlled.  4. Diabetes mellitus type 2.  5. Hypercholesterolemia on Zocor.

## 2012-08-25 ENCOUNTER — Telehealth: Payer: Self-pay | Admitting: Cardiology

## 2012-08-25 HISTORY — PX: CARDIAC CATHETERIZATION: SHX172

## 2012-08-25 NOTE — Telephone Encounter (Signed)
Per pt call his heart was hurting last night when he was at work and he was walking this is the time when it most happens and he want to talk to someone so they can let Dr. [place]and know

## 2012-08-25 NOTE — Telephone Encounter (Signed)
**Note De-Identified Antonio [name] Obfuscation** Pt. had OV with Dr. [place] on 11-8 and was advised to take NTG prior to exertion to prevent CP. Pt. states that he had CP last night at work and that he forgot to bring his NTG bottle with him. He states he just wants Dr. [place] to be aware that he continues to have CP. Pt. is advised to keep NTG bottle with him at all times and to call us back if CP worsens, he verbalized understanding.

## 2012-09-01 ENCOUNTER — Telehealth: Payer: Self-pay | Admitting: Cardiology

## 2012-09-01 DIAGNOSIS — R079 Chest pain, unspecified: Secondary | ICD-10-CM

## 2012-09-01 NOTE — Telephone Encounter (Signed)
Patient called was told effient 10 mg samples left at 3rd floor front desk.Patient stated he has had 2 episode of chest pain since he last saw Dr.Jordan.States last night at work he had pain in center of his chest.States did not take NTG it causes a bad headache.Patient advised to take NTG if he has any more chest pain.Will let Dr.Jordan know tomorrow 09/02/12 he has had pain.

## 2012-09-01 NOTE — Telephone Encounter (Signed)
Pt needs samples of effient.  °

## 2012-09-03 ENCOUNTER — Inpatient Hospital Stay (HOSPITAL_COMMUNITY)
Admission: EM | Admit: 2012-09-03 | Discharge: 2012-09-04 | DRG: 124 | Disposition: A | Discharge status: Home / Self Care | Payer: BC Managed Care – PPO | Attending: Emergency Medicine | Admitting: Emergency Medicine

## 2012-09-03 ENCOUNTER — Encounter (HOSPITAL_COMMUNITY): Payer: Self-pay | Admitting: Emergency Medicine

## 2012-09-03 ENCOUNTER — Other Ambulatory Visit: Payer: Self-pay

## 2012-09-03 ENCOUNTER — Emergency Department (HOSPITAL_COMMUNITY): Payer: BC Managed Care – PPO

## 2012-09-03 DIAGNOSIS — R0789 Other chest pain: Secondary | ICD-10-CM | POA: Diagnosis present

## 2012-09-03 DIAGNOSIS — F1011 Alcohol abuse, in remission: Secondary | ICD-10-CM | POA: Diagnosis present

## 2012-09-03 DIAGNOSIS — Z8546 Personal history of malignant neoplasm of prostate: Secondary | ICD-10-CM

## 2012-09-03 DIAGNOSIS — Z23 Encounter for immunization: Secondary | ICD-10-CM

## 2012-09-03 DIAGNOSIS — Z79899 Other long term (current) drug therapy: Secondary | ICD-10-CM

## 2012-09-03 DIAGNOSIS — Z951 Presence of aortocoronary bypass graft: Secondary | ICD-10-CM

## 2012-09-03 DIAGNOSIS — Z885 Allergy status to narcotic agent status: Secondary | ICD-10-CM

## 2012-09-03 DIAGNOSIS — N401 Enlarged prostate with lower urinary tract symptoms: Secondary | ICD-10-CM | POA: Diagnosis present

## 2012-09-03 DIAGNOSIS — E785 Hyperlipidemia, unspecified: Secondary | ICD-10-CM | POA: Diagnosis present

## 2012-09-03 DIAGNOSIS — N138 Other obstructive and reflux uropathy: Secondary | ICD-10-CM | POA: Diagnosis present

## 2012-09-03 DIAGNOSIS — I2510 Atherosclerotic heart disease of native coronary artery without angina pectoris: Principal | ICD-10-CM | POA: Diagnosis present

## 2012-09-03 DIAGNOSIS — I200 Unstable angina: Secondary | ICD-10-CM

## 2012-09-03 DIAGNOSIS — E1169 Type 2 diabetes mellitus with other specified complication: Secondary | ICD-10-CM | POA: Diagnosis present

## 2012-09-03 DIAGNOSIS — Z923 Personal history of irradiation: Secondary | ICD-10-CM

## 2012-09-03 DIAGNOSIS — Z87891 Personal history of nicotine dependence: Secondary | ICD-10-CM

## 2012-09-03 DIAGNOSIS — I10 Essential (primary) hypertension: Secondary | ICD-10-CM | POA: Diagnosis present

## 2012-09-03 DIAGNOSIS — Z7982 Long term (current) use of aspirin: Secondary | ICD-10-CM

## 2012-09-03 DIAGNOSIS — E119 Type 2 diabetes mellitus without complications: Secondary | ICD-10-CM | POA: Diagnosis present

## 2012-09-03 HISTORY — DX: Type 2 diabetes mellitus without complications: E11.9

## 2012-09-03 HISTORY — DX: Hemorrhage of anus and rectum: K62.5

## 2012-09-03 LAB — CBC WITH DIFFERENTIAL/PLATELET
Basophils Absolute: 0 10*3/uL (ref 0.0–0.1)
Basophils Relative: 0 % (ref 0–1)
Eosinophils Absolute: 0.3 10*3/uL (ref 0.0–0.7)
HCT: 43.9 % (ref 39.0–52.0)
Hemoglobin: 14.9 g/dL (ref 13.0–17.0)
MCH: 29.3 pg (ref 26.0–34.0)
MCHC: 33.9 g/dL (ref 30.0–36.0)
Monocytes Absolute: 0.5 10*3/uL (ref 0.1–1.0)
Monocytes Relative: 8 % (ref 3–12)
Neutro Abs: 3.6 10*3/uL (ref 1.7–7.7)
Neutrophils Relative %: 56 % (ref 43–77)
RDW: 14.8 % (ref 11.5–15.5)

## 2012-09-03 LAB — BASIC METABOLIC PANEL
BUN: 22 mg/dL (ref 6–23)
Chloride: 101 mEq/L (ref 96–112)
Creatinine, Ser: 1.47 mg/dL — ABNORMAL HIGH (ref 0.50–1.35)
GFR calc Af Amer: 56 mL/min — ABNORMAL LOW (ref >90)
GFR calc non Af Amer: 49 mL/min — ABNORMAL LOW (ref >90)

## 2012-09-03 LAB — TROPONIN I: Troponin I: <0.3 ng/mL (ref <0.30)

## 2012-09-03 MED ORDER — INSULIN ASPART 100 UNIT/ML ~~LOC~~ SOLN: 0.0000 [IU] | Freq: Three times a day (TID) | SUBCUTANEOUS | Status: DC

## 2012-09-03 MED ORDER — ASPIRIN 81 MG PO CHEW
CHEWABLE_TABLET | ORAL | Status: AC
  Administered 2012-09-03: 324 mg via ORAL
  Filled 2012-09-03: qty 4

## 2012-09-03 MED ORDER — TRAMADOL HCL 50 MG PO TABS: 50.0000 mg | ORAL_TABLET | Freq: Four times a day (QID) | ORAL | Status: DC | PRN

## 2012-09-03 MED ORDER — ADULT MULTIVITAMIN W/MINERALS CH
1.0000 | ORAL_TABLET | Freq: Every day | ORAL | Status: DC
  Administered 2012-09-04: 1 via ORAL
  Filled 2012-09-03 (×2): qty 1

## 2012-09-03 MED ORDER — SODIUM CHLORIDE 0.9 % IJ SOLN: 3.0000 mL | INTRAMUSCULAR | Status: DC | PRN

## 2012-09-03 MED ORDER — SODIUM CHLORIDE 0.9 % IV SOLN
250.0000 mL | INTRAVENOUS | Status: DC | PRN
Start: 2012-09-03 — End: 2012-09-04

## 2012-09-03 MED ORDER — NITROGLYCERIN 0.4 MG SL SUBL: 0.4000 mg | SUBLINGUAL_TABLET | SUBLINGUAL | Status: DC | PRN

## 2012-09-03 MED ORDER — SODIUM CHLORIDE 0.9 % IJ SOLN: 3.0000 mL | Freq: Two times a day (BID) | INTRAMUSCULAR | Status: DC

## 2012-09-03 MED ORDER — SODIUM CHLORIDE 0.9 % IV SOLN
INTRAVENOUS | Status: DC
  Administered 2012-09-03: 23:00:00 via INTRAVENOUS

## 2012-09-03 MED ORDER — ONE-DAILY MULTI VITAMINS PO TABS: 1.0000 | ORAL_TABLET | Freq: Every day | ORAL | Status: DC

## 2012-09-03 MED ORDER — DIAZEPAM 5 MG PO TABS
5.0000 mg | ORAL_TABLET | ORAL | Status: AC
  Administered 2012-09-04: 5 mg via ORAL
  Filled 2012-09-03: qty 1

## 2012-09-03 MED ORDER — METOPROLOL SUCCINATE ER 50 MG PO TB24
50.0000 mg | ORAL_TABLET | Freq: Every day | ORAL | Status: DC
  Administered 2012-09-04: 50 mg via ORAL
  Filled 2012-09-03 (×2): qty 1

## 2012-09-03 MED ORDER — ONDANSETRON HCL 4 MG/2ML IJ SOLN: 4.0000 mg | Freq: Four times a day (QID) | INTRAMUSCULAR | Status: DC | PRN

## 2012-09-03 MED ORDER — ASPIRIN 81 MG PO CHEW
324.0000 mg | CHEWABLE_TABLET | ORAL | Status: AC
  Administered 2012-09-04: 324 mg via ORAL
  Filled 2012-09-03: qty 4

## 2012-09-03 MED ORDER — ZOLPIDEM TARTRATE 5 MG PO TABS: 5.0000 mg | ORAL_TABLET | Freq: Every evening | ORAL | Status: DC | PRN

## 2012-09-03 MED ORDER — ACETAMINOPHEN 325 MG PO TABS: 650.0000 mg | ORAL_TABLET | ORAL | Status: DC | PRN

## 2012-09-03 MED ORDER — SODIUM CHLORIDE 0.9 % IJ SOLN
3.0000 mL | Freq: Two times a day (BID) | INTRAMUSCULAR | Status: DC
  Administered 2012-09-03 – 2012-09-04 (×2): 3 mL via INTRAVENOUS

## 2012-09-03 MED ORDER — PNEUMOCOCCAL VAC POLYVALENT 25 MCG/0.5ML IJ INJ
0.5000 mL | INJECTION | INTRAMUSCULAR | Status: AC
  Administered 2012-09-04: 0.5 mL via INTRAMUSCULAR
  Filled 2012-09-03: qty 0.5

## 2012-09-03 MED ORDER — ATORVASTATIN CALCIUM 40 MG PO TABS
40.0000 mg | ORAL_TABLET | Freq: Every day | ORAL | Status: DC
  Administered 2012-09-03: 40 mg via ORAL
  Filled 2012-09-03 (×2): qty 1

## 2012-09-03 MED ORDER — ASPIRIN 81 MG PO CHEW
324.0000 mg | CHEWABLE_TABLET | Freq: Once | ORAL | Status: AC
  Administered 2012-09-03: 324 mg via ORAL

## 2012-09-03 MED ORDER — ALPRAZOLAM 0.25 MG PO TABS: 0.2500 mg | ORAL_TABLET | Freq: Two times a day (BID) | ORAL | Status: DC | PRN

## 2012-09-03 MED ORDER — PRASUGREL HCL 10 MG PO TABS
10.0000 mg | ORAL_TABLET | Freq: Every day | ORAL | Status: DC
  Filled 2012-09-03 (×2): qty 1

## 2012-09-03 MED ORDER — SODIUM CHLORIDE 0.9 % IV SOLN: 250.0000 mL | INTRAVENOUS | Status: DC | PRN

## 2012-09-03 MED ORDER — HEPARIN BOLUS VIA INFUSION
4000.0000 [IU] | Freq: Once | INTRAVENOUS | Status: AC
  Administered 2012-09-03: 4000 [IU] via INTRAVENOUS

## 2012-09-03 MED ORDER — INSULIN ASPART 100 UNIT/ML ~~LOC~~ SOLN: 0.0000 [IU] | Freq: Every day | SUBCUTANEOUS | Status: DC

## 2012-09-03 MED ORDER — ASPIRIN 325 MG PO TABS: 325.0000 mg | ORAL_TABLET | Freq: Once | ORAL | Status: DC

## 2012-09-03 MED ORDER — HEPARIN (PORCINE) IN NACL 100-0.45 UNIT/ML-% IJ SOLN
1050.0000 [IU]/h | INTRAMUSCULAR | Status: DC
  Administered 2012-09-03: 1050 [IU]/h via INTRAVENOUS
  Filled 2012-09-03 (×2): qty 250

## 2012-09-03 MED ORDER — MORPHINE SULFATE 2 MG/ML IJ SOLN
2.0000 mg | INTRAMUSCULAR | Status: DC | PRN
  Filled 2012-09-03: qty 1

## 2012-09-03 MED ORDER — NITROGLYCERIN 0.4 MG SL SUBL
0.4000 mg | SUBLINGUAL_TABLET | SUBLINGUAL | Status: DC | PRN
  Administered 2012-09-03: 0.4 mg via SUBLINGUAL
  Filled 2012-09-03: qty 25

## 2012-09-03 MED ORDER — ASPIRIN 81 MG PO TABS
81.0000 mg | ORAL_TABLET | Freq: Every day | ORAL | Status: DC
  Filled 2012-09-03 (×3): qty 1

## 2012-09-03 NOTE — Progress Notes (Signed)
ANTICOAGULATION CONSULT NOTE - Initial Consult  Pharmacy Consult for Heparin Indication: chest pain/ACS  Allergies  Allergen Reactions  . Demerol Other (See Comments)    hallucinations    Patient Measurements: Height: 5\' 7"  (170.2 cm) Weight: 182 lb 1.6 oz (82.6 kg) IBW/kg (Calculated) : 66.1  Heparin Dosing Weight: 82.6 kg  Vital Signs: Temp: 97.6 F (36.4 C) (11/21 1513) Temp src: Oral (11/21 1513) BP: 106/70 mmHg (11/21 2000) Pulse Rate: 71  (11/21 2000)  Labs:  Basename 09/03/12 1713 09/03/12 1516  HGB -- 14.9  HCT -- 43.9  PLT -- 147*  APTT -- --  LABPROT 12.8 --  INR 0.97 --  HEPARINUNFRC -- --  CREATININE -- 1.47*  CKTOTAL -- --  CKMB -- --  TROPONINI <0.30 --    Estimated Creatinine Clearance: 52.2 ml/min (by C-G formula based on Cr of 1.47).   Medical History: Past Medical History  Diagnosis Date  . 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 HYPERCHOLESTEROLEMIA 12/17/2010  . DM 12/17/2010  . HYPERTENSION 12/17/2010  . CAD 12/17/2010  . BENIGN PROSTATIC HYPERTROPHY, WITH OBSTRUCTION 12/17/2010  . Prostate cancer     s/p radiation     Assessment: 66 y.o. M with known CAD who presented to the [facility] on 11/21 with CP. Per cardiology, the patient has had chest pain multiple times over the past month and was scheduled for a cardiac cath on 11/27. The patient is now being admitted for [place] and pharmacy has been consulted to start heparin for anticoagulation while awaiting further cardiac work-up.  The patient was noted to be on ASA + Effient PTA. Per discussion with the patient he has had no recent surgeries or hx CVA. The patient states that he does have blood in his stool on occasion which was worked up during an admission in May '13 at which time the cause was found to be due to internal and external hemorrhoids. Will continue with full bolus on initiation this evening but will monitor blood in stool while the patient is on the heparin drip. Baseline Hgb/Hct wnl, plts  slightly low at 147. Hep weight~82.6 kg  Goal of Therapy:  Heparin level 0.3-0.7 units/ml Monitor platelets by anticoagulation protocol: Yes   Plan:  1. Heparin bolus of 4000 units x 1  2. Initiate heparin drip at rate of 1050 units/hr (10.5 ml/hr) 3. Will continue to monitor for any signs/symptoms of bleeding (especially blood in stool) and will follow up with heparin level in 6 hours   Georgina Pillion, PharmD, BCPS Clinical Pharmacist Pager: [phone] 09/03/2012 9:00 PM

## 2012-09-03 NOTE — H&P (Signed)
History and Physical   Patient ID: Antonio [name] MRN: [id number], DOB/AGE: March 31, 1946 66 y.o. Date of Encounter: 09/03/2012  Primary Physician: Sheila Oats, MD Primary Cardiologist:  PJ   Chief Complaint:  Chest pain  HPI: Antonio [name] is a 66 year old male with a history of coronary artery disease. He has had multiple episodes of chest pain in the last month. He saw Dr. [place] for this and is scheduled for a cardiac catheterization on 11/27. Over the last few days, he has noted an increase in the frequency of his episodes of chest pain. Initially, they occurred with significant exertion. Recently, they have occurred more frequently with less exertion. In the last few days, he has had 2 episodes of chest pain that started at rest. The pain is substernal and last p.m., reached a 10/10. It is associated with shortness of breath. There is no nausea or diaphoresis. He states it also hurts more to take a deep breath. He takes nitroglycerin occasionally when rest does not relieve his symptoms. Last p.m., the pain started at rest and was a 10/10. He took a sublingual nitroglycerin and the pain decreased but did not resolve. He went to sleep and when he woke this a.m. he was pain-free. However, today he had an episode of pain that was also an 8 or 9/10. He took a sublingual nitroglycerin but his pain did not resolve. When his pain did not resolve, he came to the emergency room. In the emergency room he received a second nitroglycerin as well as ASA 81x4. His pain is currently a 2/10. This is his usual anginal pain. He also states that he thought it was a pulled muscle and that is the reason he has not been as aggressive about pursuing evaluation.  Past Medical History  Diagnosis Date  . 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 HYPERCHOLESTEROLEMIA 12/17/2010  . DM 12/17/2010  . HYPERTENSION 12/17/2010  . CAD 12/17/2010  . BENIGN PROSTATIC HYPERTROPHY, WITH OBSTRUCTION 12/17/2010  . Prostate cancer    s/p radiation      Surgical History:  Past Surgical History  Procedure Date  . Cardiac catheterization 01/27/2009    ef 60%  . Coronary artery bypass graft 1996    LIMA GRAFT TO THE LAD, SAPHENOUS VEIN GRAFT SEQUENTIALLY TO THE FIRST AND SECOND OBTUSE MARGINAL VESSELS, SAPHENOUS VEIN GRAFT TO THE DIAGONAL, AND SAPHENOUS VEIN GRAFT TO THE DISTAL RIGHT CORONARY   . Hand surgery     LEFT HAND  . Foot surgery     LEFT  . Cervical spine surgery      I have reviewed the patient's current medications. Prior to Admission medications   Medication Sig Start Date End Date Taking? Authorizing Provider  aspirin 81 MG tablet Take 81 mg by mouth daily.    Yes Historical Provider, MD  Aspirin-Acetaminophen (GOODYS BODY PAIN PO) Take 1 packet by mouth 2 (two) times daily as needed. For pain   Yes Historical Provider, MD  losartan-hydrochlorothiazide (HYZAAR) 100-25 MG per tablet Take 1 tablet by mouth daily.   Yes Historical Provider, MD  metFORMIN (GLUCOPHAGE-XR) 500 MG 24 hr tablet Take 500 mg by mouth daily. 07/13/12  Yes Sherrilyn Nairn M [place], MD  metoprolol (LOPRESSOR) 50 MG tablet Take 50 mg by mouth daily.   Yes Historical Provider, MD  Multiple Vitamin (MULTIVITAMIN) tablet Take 1 tablet by mouth daily.     Yes Historical Provider, MD  nitroGLYCERIN (NITROSTAT) 0.4 MG SL tablet Place 0.4 mg under the tongue every 5 (  five) minutes as needed. FOR CHEST PAIN 08/21/12  Yes Kristain Filo M [place], MD  prasugrel (EFFIENT) 10 MG TABS Take 1 tablet (10 mg total) by mouth daily. 11/29/11  Yes Rudie Rikard M [place], MD  simvastatin (ZOCOR) 80 MG tablet Take 40 mg by mouth every morning. 08/14/12  Yes Kaelee Pfeffer M [place], MD  testosterone cypionate (DEPOTESTOTERONE CYPIONATE) 200 MG/ML injection  08/14/12  Yes Historical Provider, MD  traMADol (ULTRAM) 50 MG tablet Take 50 mg by mouth every 6 (six) hours as needed. FOR PAIN 03/24/12 03/24/13 Yes Barrington Worley M [place], MD    Scheduled Meds:    . [COMPLETED] aspirin  324 mg Oral Once  .  aspirin  81 mg Oral Daily  . atorvastatin  40 mg Oral q1800  . insulin aspart  0-15 Units Subcutaneous TID WC  . insulin aspart  0-5 Units Subcutaneous QHS  . metoprolol succinate  50 mg Oral Daily  . multivitamin with minerals  1 tablet Oral Daily  . prasugrel  10 mg Oral Daily  . [DISCONTINUED] aspirin  325 mg Oral Once  . [DISCONTINUED] multivitamin  1 tablet Oral Daily  . [DISCONTINUED] multivitamin  1 tablet Oral Daily   Continuous Infusions:  PRN Meds:.morphine injection, nitroGLYCERIN, traMADol  Allergies:  Allergies  Allergen Reactions  . Demerol Other (See Comments)    hallucinations    History   Social History  . Marital Status: Married    Spouse Name: N/A    Number of Children: N/A  . Years of Education: N/A   Occupational History  . 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 Smoking status: Former Smoker    Quit date: 10/14/2002  . Smokeless tobacco: Not on file  . Alcohol Use: Yes     Comment: Hx abuse, quit x 15 years  . Drug Use: No  . Sexually Active: Not on file   Other Topics Concern  . Not on file   Social History Narrative   Regular exercise-yes     Family History  Problem Relation Age of Onset  . Diabetes Other     2 siblings, 1 of whom is deceased  . Hypertension Other   . Cancer Other     Lung Cancer  . Cancer Other     FH of Prostate Cancer 1st degree relative   Review of Systems: He has not had any recent illnesses, fevers or chills. His job requires significant exertion and he has chronic musculoskeletal aches and pains. He has no cough or cold symptoms. Currently his chest wall is nontender and is not having pain with deep inspiration. He is not having any GI symptoms and has had no melena or bright red blood per rectum recently. Full 14-point review of systems otherwise negative except as noted above.  Physical Exam: Blood pressure 98/66, pulse 76, temperature 97.6 F (36.4 C), temperature source Oral, resp.  rate 15, SpO2 97.00%. General: Well developed, well nourished, male in no acute distress. Head: Normocephalic, atraumatic, sclera non-icteric, no xanthomas, nares are without discharge. Dentition: Edentulous Neck: No carotid bruits. JVD not elevated. No thyromegally Lungs: Good expansion bilaterally. without wheezes or rhonchi. Clear bilaterally Heart: Regular rate and rhythm with S1 S2.  No S3 or S4.  No significant murmur, no rubs, or gallops appreciated. Abdomen: Soft, non-tender, non-distended with normoactive bowel sounds. No hepatomegaly. No rebound/guarding. No obvious abdominal masses. Msk:  Strength and tone appear normal for age. No joint deformities or effusions,  no spine or costo-vertebral angle tenderness. Extremities: No clubbing or cyanosis. No edema.  Distal pedal pulses are 2+ in 4 extrem Neuro: Alert and oriented X 3. Moves all extremities spontaneously. No focal deficits noted. Psych:  Responds to questions appropriately with a normal affect. Skin: No rashes or lesions noted  Labs:   Lab Results  Component Value Date   WBC 6.3 09/03/2012   HGB 14.9 09/03/2012   HCT 43.9 09/03/2012   MCV 86.4 09/03/2012   PLT 147* 09/03/2012    Basename 09/03/12 1713  INR 0.97     Lab 09/03/12 1516  NA 138  K 4.3  CL 101  CO2 27  BUN 22  CREATININE 1.47*  CALCIUM 9.6  PROT --  BILITOT --  ALKPHOS --  ALT --  AST --  GLUCOSE 214*    Basename 09/03/12 1713  CKTOTAL --  CKMB --  TROPONINI <0.30    Basename 09/03/12 1538  TROPIPOC 0.01   Radiology/Studies:  Dg Chest 2 View 09/03/2012  *RADIOLOGY REPORT*  Clinical Data: Lower left chest pain for 3 weeks, shortness of breath  CHEST - 2 VIEW  Comparison: Chest x-ray of 09/14/2011  Findings: No active infiltrate or effusion is seen.  There is mild cardiomegaly present.  Median sternotomy sutures are noted from prior CABG.  No bony abnormality is seen.  A lower anterior cervical spine fusion plate is present.   IMPRESSION:   Stable cardiomegaly.  No active lung disease.   Original Report Authenticated By: Dwyane Dee, M.D.    Cardiac Cath: 01/17/2009 Left anterior descending artery is occluded proximally after the first  septal perforator.  The left circumflex coronary has a 50% stenosis in the mid in the  proximal vessel. There is moderate distal disease up to 50%. The first  obtuse marginal vessel is occluded. The second obtuse marginal has a  95% stenosis at its origin.  The right coronary artery is occluded in the proximal vessel. There are  bridging collaterals to the mid and distal right coronary artery.  Saphenous vein graft to the distal right coronary artery/PDA is widely  patent. There is a 30% narrowing in the distal saphenous vein graft.  Saphenous vein graft to the first diagonal branch is widely patent.  Saphenous vein graft to the first and second obtuse marginal vessels is  widely patent.  The LIMA graft to the LAD is widely patent. The distal LAD as it wraps  around the apex shows a 50-60% stenosis.  Left ventricular angiography was performed in the RAO view. This  demonstrates normal left ventricular size and contractility with overall  ejection fraction estimated 60%. There is a diverticulum in the basal  inferior wall. There is no significant mitral insufficiency.  ECG: 03-Sep-2012 15:11:58  Normal sinus rhythm Inferior infarct , age undetermined T wave abnormality, consider anterior ischemia - minimal changes from October 2013 ECG Abnormal ECG 49mm/s 75mm/mV 100Hz  8.0.1 12SL 241 HD CID: 4 Referred by: Unconfirmed Vent. rate 76 BPM PR interval 158 ms QRS duration 88 ms QT/QTc 360/405 ms P-R-T axes 77 33 102  ASSESSMENT AND PLAN:  Principal Problem:  *Unstable angina - admit, rule out MI, add heparin, and nitrates as blood pressure will tolerate, cardiac catheterization in a.m. or sooner if medical therapy is unsuccessful.  Otherwise, continue home medications and  screen for cardiac risk factors Active Problems:  DM  HYPERCHOLESTEROLEMIA HTN   Signed,  Theodore Demark PA-C 09/03/2012, 6:23 PM   Patient seen and examined  and history reviewed. Agree with above findings and plan. Patient with increasing anginal symptoms despite optimal medical therapy. Class 3 angina. Will proceed with cardiac cath in am.  [name] [facility] 09/04/2012 9:08 AM

## 2012-09-03 NOTE — ED Notes (Signed)
Spoke with flow manager re: bed placement, per flow manager the request is being placed now

## 2012-09-03 NOTE — Telephone Encounter (Signed)
Spoke to patient stated he had severe chest pain last night took NTG x1 with relief, went to sleep.States 20 mins ago started having chest pain again has not took NTG,states just left our office he picked up his effient samples.Stated he did not want to bother me.Patient was told to take a NTG and needs to go to ER if pain continues.

## 2012-09-03 NOTE — Telephone Encounter (Signed)
New problem:   C/O chest pain last night. Took 1 nitro. Still having slight chest pain . No  Er visit .

## 2012-09-03 NOTE — ED Provider Notes (Signed)
History     CSN: [id number]  Arrival date & time 09/03/12  1508   First MD Initiated Contact with Patient 09/03/12 1519      Chief Complaint  Patient presents with  . Chest Pain    (Consider location/radiation/quality/duration/timing/severity/associated sxs/prior treatment) The history is provided by the patient.  worsening intermittent CP over the past 3 weeks. Associated SOB. No radiation of pain. Pain moderate in severity. Lasts 20-30 minutes. Exacerbated by exertion. Relieved by rest. No cough of congestion. No fever. No unilateral leg swelling. No hx of DVT of PE. No other complaints. Hx of CAD. Reports feels similar to prior heart issues. No hx of CHF or COPD  Past Medical History  Diagnosis Date  . 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 HYPERCHOLESTEROLEMIA 12/17/2010  . DM 12/17/2010  . HYPERTENSION 12/17/2010  . CAD 12/17/2010  . BENIGN PROSTATIC HYPERTROPHY, WITH OBSTRUCTION 12/17/2010  . Prostate cancer     s/p radiation     Past Surgical History  Procedure Date  . Cardiac catheterization 01/27/2009    ef 60%  . Coronary artery bypass graft 1996    LIMA GRAFT TO THE LAD, SAPHENOUS VEIN GRAFT SEQUENTIALLY TO THE FIRST AND SECOND OBTUSE MARGINAL VESSELS, SAPHENOUS VEIN GRAFT TO THE DIAGONAL, AND SAPHENOUS VEIN GRAFT TO THE DISTAL RIGHT CORONARY   . Hand surgery     LEFT HAND  . Foot surgery     LEFT  . Cervical spine surgery     Family History  Problem Relation Age of Onset  . Diabetes Other     2 siblings, 1 of whom is deceased  . Hypertension Other   . Cancer Other     Lung Cancer  . Cancer Other     FH of Prostate Cancer 1st degree relative    History  Substance Use Topics  . Smoking status: Former Smoker    Quit date: 10/14/2002  . Smokeless tobacco: Not on file  . Alcohol Use: Yes     Comment: x 15 years      Review of Systems  All other systems reviewed and are negative.    Allergies  Demerol  Home Medications   Current Outpatient Rx    Name  Route  Sig  Dispense  Refill  . ASPIRIN 81 MG PO TABS   Oral   Take 81 mg by mouth daily.           Deeann Dowse BODY PAIN PO   Oral   Take 1 packet by mouth 2 (two) times daily as needed. For pain         . LOSARTAN POTASSIUM-HCTZ 100-25 MG PO TABS   Oral   Take 1 tablet by mouth daily.         Marland Kitchen METFORMIN HCL ER 500 MG PO TB24   Oral   Take 500 mg by mouth daily.         Marland Kitchen METOPROLOL TARTRATE 50 MG PO TABS   Oral   Take 50 mg by mouth daily.         Marland Kitchen ONE-DAILY MULTI VITAMINS PO TABS   Oral   Take 1 tablet by mouth daily.           Marland Kitchen NAPROXEN SODIUM 220 MG PO TABS   Oral   Take 220 mg by mouth 2 (two) times daily as needed. For pain         . NITROGLYCERIN 0.4 MG SL SUBL   Sublingual  Place 1 tablet (0.4 mg total) under the tongue every 5 (five) minutes as needed for chest pain.   90 tablet   3   . PRASUGREL HCL 10 MG PO TABS   Oral   Take 1 tablet (10 mg total) by mouth daily.   30 tablet   5   . SIMVASTATIN 80 MG PO TABS   Oral   Take 0.5 tablets (40 mg total) by mouth at bedtime.   90 tablet   1   . TESTOSTERONE CYPIONATE 200 MG/ML IM OIL               . TRAMADOL HCL 50 MG PO TABS   Oral   Take 1 tablet (50 mg total) by mouth every 6 (six) hours as needed for pain.   50 tablet   4     BP 114/69  Pulse 76  Temp 97.6 F (36.4 C) (Oral)  Resp 20  SpO2 98%  Physical Exam  Nursing note and vitals reviewed. Constitutional: He is oriented to person, place, and time. He appears well-developed and well-nourished.  HENT:  Head: Normocephalic and atraumatic.  Eyes: EOM are normal.  Neck: Normal range of motion.  Cardiovascular: Normal rate, regular rhythm, normal heart sounds and intact distal pulses.   Pulmonary/Chest: Effort normal and breath sounds normal. No respiratory distress.  Abdominal: Soft. He exhibits no distension. There is no tenderness.  Musculoskeletal: Normal range of motion.  Neurological: He is alert and  oriented to person, place, and time.  Skin: Skin is warm and dry.  Psychiatric: He has a normal mood and affect. Judgment normal.    ED Course  Procedures (including critical care time)   Date: 09/03/2012  Rate: 76  Rhythm: normal sinus rhythm  QRS Axis: normal  Intervals: normal  ST/T Wave abnormalities: Nonspecific ST and T wave changes  Conduction Disutrbances: none  Narrative Interpretation:   Old EKG Reviewed: No significant changes noted  CRITICAL CARE Performed by: Lyanne Co Total critical care time: 30 Critical care time was exclusive of separately billable procedures and treating other patients. Critical care was necessary to treat or prevent imminent or life-threatening deterioration. Critical care was time spent personally by me on the following activities: development of treatment plan with patient and/or surrogate as well as nursing, discussions with consultants, evaluation of patient's response to treatment, examination of patient, obtaining history from patient or surrogate, ordering and performing treatments and interventions, ordering and review of laboratory studies, ordering and review of radiographic studies, pulse oximetry and re-evaluation of patient's condition.     Labs Reviewed  CBC WITH DIFFERENTIAL - Abnormal; Notable for the following:    Platelets 147 (*)     All other components within normal limits  BASIC METABOLIC PANEL - Abnormal; Notable for the following:    Glucose, Bld 214 (*)     Creatinine, Ser 1.47 (*)     GFR calc non Af Amer 49 (*)     GFR calc Af Amer 56 (*)     All other components within normal limits  GLUCOSE, CAPILLARY - Abnormal; Notable for the following:    Glucose-Capillary 131 (*)     All other components within normal limits  COMPREHENSIVE METABOLIC PANEL - Abnormal; Notable for the following:    Glucose, Bld 159 (*)     Creatinine, Ser 1.43 (*)     Albumin 3.3 (*)     Total Bilirubin 0.2 (*)     GFR calc non Af  Amer 50 (*)     GFR calc Af Amer 58 (*)     All other components within normal limits  APTT - Abnormal; Notable for the following:    aPTT >200 (*)     All other components within normal limits  HEMOGLOBIN A1C - Abnormal; Notable for the following:    Hemoglobin A1C 7.5 (*)     Mean Plasma Glucose 169 (*)     All other components within normal limits  LIPID PANEL - Abnormal; Notable for the following:    HDL 30 (*)     All other components within normal limits  GLUCOSE, CAPILLARY - Abnormal; Notable for the following:    Glucose-Capillary 142 (*)     All other components within normal limits  GLUCOSE, CAPILLARY - Abnormal; Notable for the following:    Glucose-Capillary 103 (*)     All other components within normal limits  TROPONIN I  PROTIME-INR  POCT I-STAT TROPONIN I  HEPARIN LEVEL (UNFRACTIONATED)  TROPONIN I  TROPONIN I  GLUCOSE, CAPILLARY  LAB REPORT - SCANNED   No results found.   1. Unstable angina       MDM  Will admit for what sounds like unstable angina. Heparin gtt started. Asa given. Cards to admit. Doubt CHF. Doubt PE. No signs of PNA       Lyanne Co, MD 09/05/12 2100

## 2012-09-03 NOTE — ED Notes (Signed)
Pt c/o left sided CP intermittently x 3 weeks; pt sts some SOB

## 2012-09-04 ENCOUNTER — Encounter (HOSPITAL_COMMUNITY): Admission: EM | Disposition: A | Discharge status: Home / Self Care | Payer: Self-pay | Source: Home / Self Care

## 2012-09-04 ENCOUNTER — Other Ambulatory Visit: Payer: BC Managed Care – PPO

## 2012-09-04 ENCOUNTER — Encounter (HOSPITAL_COMMUNITY): Payer: Self-pay | Admitting: Physician Assistant

## 2012-09-04 DIAGNOSIS — I2510 Atherosclerotic heart disease of native coronary artery without angina pectoris: Secondary | ICD-10-CM

## 2012-09-04 HISTORY — PX: LEFT HEART CATHETERIZATION WITH CORONARY ANGIOGRAM: SHX5451

## 2012-09-04 LAB — COMPREHENSIVE METABOLIC PANEL
ALT: 18 U/L (ref 0–53)
Alkaline Phosphatase: 54 U/L (ref 39–117)
CO2: 28 mEq/L (ref 19–32)
Calcium: 8.9 mg/dL (ref 8.4–10.5)
GFR calc Af Amer: 58 mL/min — ABNORMAL LOW (ref >90)
GFR calc non Af Amer: 50 mL/min — ABNORMAL LOW (ref >90)
Glucose, Bld: 159 mg/dL — ABNORMAL HIGH (ref 70–99)
Sodium: 140 mEq/L (ref 135–145)
Total Bilirubin: 0.2 mg/dL — ABNORMAL LOW (ref 0.3–1.2)

## 2012-09-04 LAB — LIPID PANEL
HDL: 30 mg/dL — ABNORMAL LOW (ref >39)
LDL Cholesterol: 55 mg/dL (ref 0–99)
Total CHOL/HDL Ratio: 3.6 RATIO
Triglycerides: 114 mg/dL (ref <150)
VLDL: 23 mg/dL (ref 0–40)

## 2012-09-04 LAB — GLUCOSE, CAPILLARY
Glucose-Capillary: 142 mg/dL — ABNORMAL HIGH (ref 70–99)
Glucose-Capillary: 103 mg/dL — ABNORMAL HIGH (ref 70–99)
Glucose-Capillary: 95 mg/dL (ref 70–99)

## 2012-09-04 SURGERY — LEFT HEART CATHETERIZATION WITH CORONARY ANGIOGRAM
Anesthesia: LOCAL

## 2012-09-04 MED ORDER — SODIUM CHLORIDE 0.9 % IV SOLN: 1.0000 mL/kg/h | INTRAVENOUS | Status: AC

## 2012-09-04 MED ORDER — METFORMIN HCL ER 500 MG PO TB24: 500.0000 mg | ORAL_TABLET | Freq: Every day | ORAL | Status: DC

## 2012-09-04 MED ORDER — MIDAZOLAM HCL 2 MG/2ML IJ SOLN
INTRAMUSCULAR | Status: AC
  Filled 2012-09-04: qty 2

## 2012-09-04 MED ORDER — LOSARTAN POTASSIUM-HCTZ 100-25 MG PO TABS: 1.0000 | ORAL_TABLET | Freq: Every day | ORAL | Status: DC

## 2012-09-04 MED ORDER — NITROGLYCERIN 0.2 MG/ML ON CALL CATH LAB
INTRAVENOUS | Status: AC
  Filled 2012-09-04: qty 1

## 2012-09-04 MED ORDER — HEPARIN (PORCINE) IN NACL 2-0.9 UNIT/ML-% IJ SOLN
INTRAMUSCULAR | Status: AC
  Filled 2012-09-04: qty 1000

## 2012-09-04 MED ORDER — FENTANYL CITRATE 0.05 MG/ML IJ SOLN
INTRAMUSCULAR | Status: AC
  Filled 2012-09-04: qty 2

## 2012-09-04 MED ORDER — LIDOCAINE HCL (PF) 1 % IJ SOLN
INTRAMUSCULAR | Status: AC
  Filled 2012-09-04: qty 30

## 2012-09-04 NOTE — Progress Notes (Signed)
ANTICOAGULATION CONSULT NOTE - Follow Up Consult  Pharmacy Consult for heparin Indication: chest pain/ACS  Labs:  Basename 09/04/12 0455 09/03/12 2310 09/03/12 1713 09/03/12 1516  HGB -- -- -- 14.9  HCT -- -- -- 43.9  PLT -- -- -- 147*  APTT -- >200* -- --  LABPROT -- -- 12.8 --  INR -- -- 0.97 --  HEPARINUNFRC 0.52 -- -- --  CREATININE -- -- -- 1.47*  CKTOTAL -- -- -- --  CKMB -- -- -- --  TROPONINI -- <0.30 <0.30 --    Assessment/Plan:  66yo male therapeutic on heparin with initial dosing for CP.  Scheduled for cath this afternoon.  Will continue gtt at current rate and confirm stable with additional level.  Colleen Can PharmD BCPS 09/04/2012,6:10 AM

## 2012-09-04 NOTE — Clinical Social Work Psychosocial (Signed)
     Clinical Social Work Department BRIEF PSYCHOSOCIAL ASSESSMENT 09/04/2012  Patient:  Antonio [name],Antonio [name]     Account Number:  [id number]     Admit date:  09/03/2012  Clinical Social Worker:  Margaree Mackintosh  Date/Time:  09/04/2012 01:07 PM  Referred by:  Physician  Date Referred:  09/04/2012 Referred for  Advanced Directives   Other Referral:   Interview type:  Patient Other interview type:    PSYCHOSOCIAL DATA Living Status:  FAMILY Admitted from facility:   Level of care:   Primary support name:  Joylene Grapes: [phone] Primary support relationship to patient:  SPOUSE Degree of support available:   Unknown.    CURRENT CONCERNS Current Concerns  Other - See comment   Other Concerns:   Advance Directives.    SOCIAL WORK ASSESSMENT / PLAN Clinical Social Worker recieved referral for Advance Directives.  CSW reviewed chart and met wtih pt at bedside. CSW introduced self, explained role, and provided support. Pt confirmed interest in Advance Directives.  CSW provided HCPOA paperwork and provided opportunity for pt to address questions/concerns. Pt plans on spending time to review HCPOA and to have paperowrk notarized outside of hospital. CSW to sign off at this time, please re consult if needed.   Assessment/plan status:  Information/Referral to [facility] Other assessment/ plan:   Information/referral to community resources:   [employer].    PATIENTS/FAMILYS RESPONSE TO PLAN OF CARE: Pt was pleasant and engaged in conversation. Pt thanked CSW for intervention.

## 2012-09-04 NOTE — Progress Notes (Signed)
Utilization review completed.  

## 2012-09-04 NOTE — Progress Notes (Signed)
66 year old male with a history of coronary artery disease. He has had multiple episodes of chest pain in the last month. He saw Dr. Swaziland for this and is scheduled for a cardiac catheterization on 11/27. Over the last few days, he has noted an increase in the frequency of his episodes of chest pain.  Has significant history of diabetes.  Takes Metformin XR 500 mg daily at home.    Results for Antonio [name], Antonio [name] (MRN [id number]) as of 09/04/2012 13:32  Ref. Range 09/03/2012 23:10  Hemoglobin A1C Latest Range: <5.7 % 7.5 (H)   A1c fairly within goal.  Does not have PCP.  Patient stated he used to see Dr. Kathe Mariner for diabetes management.  Has not seen Dr. Thea Silversmith in over 2 years.  Patient told me he would like to find another physician.  Spoke to patient about his A1c and asked him if he had any diabetes questions.  Patient told me he did not have any questions at this time.  Gave patient some basic carbohydrate counting pamphlets.  Also gave patient the names of several local endocrinologists and also gave him the telephone number for the physician referral line [phone].  Will follow. Ambrose Finland RN, MSN, CDE Diabetes Coordinator Inpatient Diabetes Program [phone]

## 2012-09-04 NOTE — Discharge Summary (Signed)
Discharge Summary   Patient ID: Antonio [name],  MRN: [id number], DOB/AGE: 01/30/1946 66 y.o.  Admit date: 09/03/2012 Discharge date: 09/04/2012  Primary Physician: Antonio Oats, MD Primary Cardiologist: Swaziland, Peter, MD  Discharge Diagnoses Principal Problem:  *Unstable angina Active Problems:  DM  HYPERCHOLESTEROLEMIA  HYPERTENSION  CAD- native coronaries  CAD (coronary artery disease) of artery bypass graft  Allergies Allergies  Allergen Reactions  . Demerol Other (See Comments)    hallucinations   Diagnostic Studies/Procedures  PA/LATERAL CHEST X-RAY - 09/03/12   Comparison: Chest x-ray of 09/14/2011  Findings: No active infiltrate or effusion is seen. There is mild  cardiomegaly present. Median sternotomy sutures are noted from  prior CABG. No bony abnormality is seen. A lower anterior  cervical spine fusion plate is present.  IMPRESSION:  Stable cardiomegaly. No active lung disease.   CARDIAC CATHETERIZATION - 09/04/12  Procedural Findings:  Hemodynamics:  AO 116/75 with a mean of 93 mmHg  LV 114/11 mmHg  Coronary angiography:  Coronary dominance: right  Left mainstem: Normal.  Left anterior descending (LAD): Diffuse 60-70% proximal. Occluded after the first septal perforator.  Left circumflex (LCx): Diffuse 60% disease proximally. The first and second marginal branches are occluded. There is a long segment of disease in the distal circumflex prior to the takeoff of the small terminal marginal branch. Within this segment of disease there is up to 90% stenosis. There is a 70% stenosis in the small terminal marginal branch.  Right coronary artery (RCA): The right coronary is occluded in the mid vessel. There are bridging collaterals to the mid and distal right coronary.  The saphenous vein graft to the PDA is widely patent. There is a 30-40% narrowing in the proximal vein graft that is unchanged from 2010.  The saphenous vein graft to the first diagonal  is widely patent. There is 30% disease in the proximal graft.  Saphenous vein graft sequentially to the first and second obtuse marginal vessels is widely patent.  The LIMA graft to the LAD is widely patent.  Left ventriculography: Not performed  Final Conclusions:  1. Severe three-vessel obstructive coronary disease.  2. Continued patency of all grafts including saphenous vein graft to the diagonal, deafness vein graft sequentially to the first and second obtuse marginal vessels, saphenous vein graft to the PDA, and LIMA graft to the LAD.   History of Present Illness/Hospital Course   Antonio [name] is a 66yo male who was admitted to [facility] on 09/03/12 with the above problem list. He has a history of CAD (s/p CABG x 4 in 1996) HTN, HL and type 2 DM. He had recently followed up with Dr. Swaziland c/o accelerating anginal symptoms (class III) initially on exertion, then progressing at rest. Diagnostic cardiac catheterization was scheduled for 11/27 after conservation management with the employment of NTG SL did not relieve his pain. The pain worsened acutely the day of admission, at rest, rated at a 10/10, at which time he presented to the [facility] ED. There, EKG revealed no acute ischemic changes, troponin-I WNL. He was observed overnight with plans for cardiac catheterization in the morning. He was heparinized and started on NTG. He was informed, consented and prepped for the procedure (outlined above). This was notable for severe 3v CAD with continued graft patency x 4. The recommendation was made to continue medical therapy. There was an area of diffuse dz in the distal LCx (up to 90%) deemed unamenable for PCI. 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 He was advised to rest x 1 week. If chest pain continues, a trial of Ranexa has been recommended. He was assessed by Dr. Swaziland, and deemed stable for  discharge. He completed 4-6 hours of bed rest and ambulated without incident. Groin site stable with no tenderness to palpation, no bleeding, discharge, induration, swelling or bruit. He will resume all outpatient medications. He will follow-up in ~ 4 weeks. This information, including post-cath instructions and wound care, has been clearly outlined in the discharge AVS.    Discharge Vitals:  Blood pressure 116/74, pulse 69, temperature 97.6 F (36.4 C), temperature source Oral, resp. rate 17, height 5' 7.5" (1.715 m), weight 83.008 kg (183 lb), SpO2 98.00%.   Labs: Recent Labs  [facility] 09/03/12 1516   WBC 6.3   HGB 14.9   HCT 43.9   MCV 86.4   PLT 147*    Lab 09/04/12 0455 09/03/12 1516  NA 140 138  K 3.8 4.3  CL 104 101  CO2 28 27  BUN 20 22  CREATININE 1.43* 1.47*  CALCIUM 8.9 9.6  PROT 6.1 --  BILITOT 0.2* --  ALKPHOS 54 --  ALT 18 --  AST 22 --  AMYLASE -- --  LIPASE -- --  GLUCOSE 159* 214*    Recent Labs  Basename 09/04/12 0455 09/03/12 2310 09/03/12 1713   CKTOTAL -- -- --   CKMB -- -- --   CKMBINDEX -- -- --   TROPONINI <0.30 <0.30 <0.30   Recent Labs  Basename 09/04/12 0455   CHOL 108   HDL 30*   LDLCALC 55   TRIG 114   CHOLHDL 3.6   LDLDIRECT --   Disposition:  Discharge Orders    Future Appointments: Provider: Department: Dept Phone: Center:   10/02/2012 1:45 PM [name] Swaziland, MD  [facility] Main Office [place]) [phone] [id number]     Follow-up Information    Follow up with [name] Swaziland, MD. On 10/02/2012. (At 1:45 PM for follow-up. )    Contact information:   [address] [place] [address] [phone]        Discharge Medications:    Medication List     As of 09/04/2012  3:32 PM    CONTINUE taking these medications         aspirin 81 MG tablet      losartan-hydrochlorothiazide 100-25 MG per tablet   Commonly known as: HYZAAR   Take 1 tablet by mouth daily.   Start taking on: 09/05/2012       metFORMIN 500 MG 24 hr tablet   Commonly known as: GLUCOPHAGE-XR   Take 1 tablet (500 mg total) by mouth daily.   Start taking on: 09/06/2012      metoprolol 50 MG tablet   Commonly known as: LOPRESSOR      multivitamin tablet      nitroGLYCERIN 0.4 MG SL tablet   Commonly known as: NITROSTAT      prasugrel 10 MG Tabs   Commonly known as: EFFIENT   Take 1 tablet (10 mg total) by mouth daily.      simvastatin 80 MG tablet   Commonly known as: ZOCOR      testosterone cypionate 200 MG/ML injection   Commonly known as: DEPOTESTOTERONE CYPIONATE      traMADol 50 MG tablet   Commonly known as: ULTRAM      STOP taking these medications  GOODYS BODY PAIN PO          Where to get your medications       Information on where to get these meds is not yet available. Ask your nurse or doctor.         losartan-hydrochlorothiazide 100-25 MG per tablet   metFORMIN 500 MG 24 hr tablet           Outstanding Labs/Studies: None  Duration of Discharge Encounter: Greater than 30 minutes including physician time.  Signed, R. Hurman Horn, PA-C 09/04/2012, 3:32 PM

## 2012-09-04 NOTE — Interval H&P Note (Signed)
History and Physical Interval Note:  09/04/2012 9:09 AM  Antonio [name]  has presented today for surgery, with the diagnosis of Chest pain  The various methods of treatment have been discussed with the patient and family. After consideration of risks, benefits and other options for treatment, the patient has consented to  Procedure(s) (LRB) with comments: LEFT HEART CATHETERIZATION WITH CORONARY ANGIOGRAM (N/A) as a surgical intervention .  The patient's history has been reviewed, patient examined, no change in status, stable for surgery.  I have reviewed the patient's chart and labs.  Questions were answered to the patient's satisfaction.     [name] [facility] 09/04/2012 9:09 AM

## 2012-09-04 NOTE — CV Procedure (Addendum)
   Cardiac Catheterization Procedure Note  Name: Antonio [name] MRN: [id number] DOB: 03-Jan-1946  Procedure: Left Heart Cath, Selective Coronary Angiography, saphenous vein graft angiography, LIMA graft angiography.  Indication: 66 year old black male with history of remote coronary bypass surgery who presents with increasing exertional angina.   Procedural details: The right groin was prepped, draped, and anesthetized with 1% lidocaine. Using modified Seldinger technique, a 5 French sheath was introduced into the right femoral artery. Standard Judkins catheters were used for coronary angiography and graft angiography. Catheter exchanges were performed over a guidewire. There were no immediate procedural complications. The patient was transferred to the post catheterization recovery area for further monitoring.  Procedural Findings: Hemodynamics:  AO 116/75 with a mean of 93 mmHg LV 114/11 mmHg   Coronary angiography: Coronary dominance: right  Left mainstem: Normal.  Left anterior descending (LAD): Diffuse 60-70% proximal. Occluded after the first septal perforator.  Left circumflex (LCx): Diffuse 60% disease proximally. The first and second marginal branches are occluded. There is a long segment of disease in the distal circumflex prior to the takeoff of the small terminal marginal branch. Within this segment of disease there is up to 90% stenosis. There is a 70% stenosis in the small terminal marginal branch.  Right coronary artery (RCA): The right coronary is occluded in the mid vessel. There are bridging collaterals to the mid and distal right coronary.  The saphenous vein graft to the PDA is widely patent. There is a 30-40% narrowing in the proximal vein graft that is unchanged from 2010.  The saphenous vein graft to the first diagonal is widely patent. There is 30% disease in the proximal graft.  Saphenous vein graft sequentially to the first and second obtuse marginal vessels  is widely patent.  The LIMA graft to the LAD is widely patent.  Left ventriculography: Not performed  Final Conclusions:   1. Severe three-vessel obstructive coronary disease. 2. Continued patency of all grafts including saphenous vein graft to the diagonal, deafness vein graft sequentially to the first and second obtuse marginal vessels, saphenous vein graft to the PDA, and LIMA graft to the LAD.  Recommendations: Recommend continued medical therapy. The only area not revascularized is the distal circumflex. This is small and diffusely diseased and would not be favorable for percutaneous intervention. The patient does a lot of physical work with heavy lifting and his recent symptoms may be related to musculoskeletal pain. I recommended a week of rest. If he continues to have chest pain we could give him a trial of Ranexa. We will plan to discharge him today.  [name] [facility] 09/04/2012, 9:48 AM

## 2012-09-04 NOTE — Plan of Care (Signed)
Problem: Consults Goal: Cardiac Cath Patient Education (See Patient Education module for education specifics.) Outcome: Completed/Met Date Met:  09/04/12 Patient viewed cardiac catheterization/PCI video #115 via patient education network. No questions at this time.

## 2012-09-04 NOTE — Telephone Encounter (Signed)
Patient called 09/03/12 was told spoke to Dr.Jordan he advised schedule cardiac cath. Cath scheduled 09/09/12 at 8:30 am.Patient will come by office 09/04/12 to have lab work and pick up instructions.Patient was told needs to go to ER if continues to have chest pain.

## 2012-09-04 NOTE — Plan of Care (Signed)
Problem: Food- and Nutrition-Related Knowledge Deficit (NB-1.1) Goal: Nutrition education Formal process to instruct or train a patient/client in a skill or to impart knowledge to help patients/clients voluntarily manage or modify food choices and eating behavior to maintain or improve health.  Outcome: Completed/Met Date Met:  09/04/12  RD consulted for nutrition education regarding diabetes.     Lab Results  Component Value Date    HGBA1C 7.5* 09/03/2012    RD provided "Carbohydrate Counting for People with Diabetes" handout from the Academy of Nutrition and Dietetics. Discussed different food groups and their effects on blood sugar, emphasizing carbohydrate-containing foods. Provided list of carbohydrates and recommended serving sizes of common foods.  Discussed importance of controlled and consistent carbohydrate intake throughout the day. Provided examples of ways to balance meals/snacks and encouraged intake of high-fiber, whole grain complex carbohydrates.  Expect fair compliance.  Body mass index is 28.24 kg/(m^2). Pt meets criteria for Overweight based on current BMI.  Current diet order is Clear Liquids; s/p cardiac cath. Labs and medications reviewed. No further nutrition interventions warranted at this time.  If additional nutrition issues arise, please re-consult RD.  Kirkland Hun, RD, LDN Pager #: [phone] After-Hours Pager #: [phone]

## 2012-09-07 NOTE — Discharge Summary (Signed)
Patient seen and examined and history reviewed. Agree with above findings and plan. See cath note for my plan.   Antonio [name] [facility] 09/07/2012 8:55 AM

## 2012-09-09 ENCOUNTER — Inpatient Hospital Stay (HOSPITAL_BASED_OUTPATIENT_CLINIC_OR_DEPARTMENT_OTHER): Admission: RE | Admit: 2012-09-09 | Payer: BC Managed Care – PPO | Source: Ambulatory Visit | Admitting: Cardiology

## 2012-09-09 ENCOUNTER — Encounter (HOSPITAL_BASED_OUTPATIENT_CLINIC_OR_DEPARTMENT_OTHER): Admission: RE | Payer: Self-pay | Source: Ambulatory Visit

## 2012-09-09 SURGERY — JV LEFT HEART CATHETERIZATION WITH CORONARY ANGIOGRAM
Anesthesia: Moderate Sedation

## 2012-09-21 ENCOUNTER — Other Ambulatory Visit: Payer: Self-pay

## 2012-09-21 MED ORDER — METOPROLOL TARTRATE 50 MG PO TABS: 50.0000 mg | ORAL_TABLET | Freq: Every day | ORAL | Status: DC

## 2012-10-02 ENCOUNTER — Ambulatory Visit (INDEPENDENT_AMBULATORY_CARE_PROVIDER_SITE_OTHER): Payer: BC Managed Care – PPO | Admitting: Cardiology

## 2012-10-02 ENCOUNTER — Encounter: Payer: Self-pay | Admitting: Cardiology

## 2012-10-02 VITALS — BP 112/70 | HR 71 | Ht 67.5 in | Wt 184.4 lb

## 2012-10-02 DIAGNOSIS — I10 Essential (primary) hypertension: Secondary | ICD-10-CM

## 2012-10-02 DIAGNOSIS — I209 Angina pectoris, unspecified: Secondary | ICD-10-CM

## 2012-10-02 DIAGNOSIS — I25810 Atherosclerosis of coronary artery bypass graft(s) without angina pectoris: Secondary | ICD-10-CM

## 2012-10-02 NOTE — Patient Instructions (Signed)
Continue your current therapy  If you have chest pain try and avoid lifting and use Advil as needed.   I will see you in 6 months.
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
## 2012-10-27 ENCOUNTER — Other Ambulatory Visit: Payer: Self-pay

## 2012-10-27 MED ORDER — SIMVASTATIN 80 MG PO TABS: 40.0000 mg | ORAL_TABLET | Freq: Every morning | ORAL | Status: DC

## 2012-12-02 ENCOUNTER — Telehealth: Payer: Self-pay | Admitting: Cardiology

## 2012-12-02 ENCOUNTER — Other Ambulatory Visit: Payer: Self-pay | Admitting: Cardiology

## 2012-12-02 ENCOUNTER — Other Ambulatory Visit: Payer: Self-pay | Admitting: *Deleted

## 2012-12-02 NOTE — Telephone Encounter (Signed)
New problem    Losartan 100-25 mg   effient  10 mg    [employer]

## 2012-12-04 ENCOUNTER — Other Ambulatory Visit: Payer: Self-pay

## 2012-12-04 MED ORDER — PRASUGREL HCL 10 MG PO TABS: 10.0000 mg | ORAL_TABLET | Freq: Every day | ORAL | Status: DC

## 2012-12-11 ENCOUNTER — Other Ambulatory Visit: Payer: Self-pay

## 2012-12-11 NOTE — Telephone Encounter (Signed)
Called and left a message for patient to return phone call so we could verify if he got his Losartan-HCTZ [facility] or if he needs the refill sent to [facility]

## 2012-12-28 ENCOUNTER — Emergency Department (HOSPITAL_COMMUNITY)
Admission: EM | Admit: 2012-12-28 | Discharge: 2012-12-29 | Disposition: A | Discharge status: Home / Self Care | Payer: BC Managed Care – PPO | Attending: Emergency Medicine | Admitting: Emergency Medicine

## 2012-12-28 ENCOUNTER — Encounter (HOSPITAL_COMMUNITY): Payer: Self-pay | Admitting: Family Medicine

## 2012-12-28 DIAGNOSIS — Z8546 Personal history of malignant neoplasm of prostate: Secondary | ICD-10-CM | POA: Insufficient documentation

## 2012-12-28 DIAGNOSIS — E7800 Pure hypercholesterolemia, unspecified: Secondary | ICD-10-CM | POA: Insufficient documentation

## 2012-12-28 DIAGNOSIS — Z9861 Coronary angioplasty status: Secondary | ICD-10-CM | POA: Insufficient documentation

## 2012-12-28 DIAGNOSIS — E119 Type 2 diabetes mellitus without complications: Secondary | ICD-10-CM | POA: Insufficient documentation

## 2012-12-28 DIAGNOSIS — I2510 Atherosclerotic heart disease of native coronary artery without angina pectoris: Secondary | ICD-10-CM | POA: Insufficient documentation

## 2012-12-28 DIAGNOSIS — I209 Angina pectoris, unspecified: Secondary | ICD-10-CM | POA: Insufficient documentation

## 2012-12-28 DIAGNOSIS — S139XXA Sprain of joints and ligaments of unspecified parts of neck, initial encounter: Secondary | ICD-10-CM | POA: Insufficient documentation

## 2012-12-28 DIAGNOSIS — Z87448 Personal history of other diseases of urinary system: Secondary | ICD-10-CM | POA: Insufficient documentation

## 2012-12-28 DIAGNOSIS — Z951 Presence of aortocoronary bypass graft: Secondary | ICD-10-CM | POA: Insufficient documentation

## 2012-12-28 DIAGNOSIS — Z8719 Personal history of other diseases of the digestive system: Secondary | ICD-10-CM | POA: Insufficient documentation

## 2012-12-28 DIAGNOSIS — S161XXA Strain of muscle, fascia and tendon at neck level, initial encounter: Secondary | ICD-10-CM

## 2012-12-28 DIAGNOSIS — I252 Old myocardial infarction: Secondary | ICD-10-CM | POA: Insufficient documentation

## 2012-12-28 DIAGNOSIS — Y9389 Activity, other specified: Secondary | ICD-10-CM | POA: Insufficient documentation

## 2012-12-28 DIAGNOSIS — Z7982 Long term (current) use of aspirin: Secondary | ICD-10-CM | POA: Insufficient documentation

## 2012-12-28 DIAGNOSIS — Z87891 Personal history of nicotine dependence: Secondary | ICD-10-CM | POA: Insufficient documentation

## 2012-12-28 DIAGNOSIS — Y92410 Unspecified street and highway as the place of occurrence of the external cause: Secondary | ICD-10-CM | POA: Insufficient documentation

## 2012-12-28 DIAGNOSIS — I10 Essential (primary) hypertension: Secondary | ICD-10-CM | POA: Insufficient documentation

## 2012-12-28 DIAGNOSIS — Z79899 Other long term (current) drug therapy: Secondary | ICD-10-CM | POA: Insufficient documentation

## 2012-12-28 MED ORDER — HYDROCODONE-ACETAMINOPHEN 5-325 MG PO TABS
1.0000 | ORAL_TABLET | Freq: Once | ORAL | Status: AC
  Administered 2012-12-28: 1 via ORAL
  Filled 2012-12-28: qty 1
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
## 2012-12-29 ENCOUNTER — Emergency Department (HOSPITAL_COMMUNITY): Payer: BC Managed Care – PPO

## 2012-12-29 MED ORDER — METHOCARBAMOL 500 MG PO TABS: 500.0000 mg | ORAL_TABLET | Freq: Two times a day (BID) | ORAL | Status: DC

## 2012-12-29 MED ORDER — HYDROCODONE-ACETAMINOPHEN 5-325 MG PO TABS: 1.0000 | ORAL_TABLET | ORAL | Status: DC | PRN
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
## 2012-12-29 NOTE — ED Provider Notes (Signed)
Medical screening examination/treatment/procedure(s) were performed by non-physician practitioner and as supervising physician I was immediately available for consultation/collaboration.  John-Adam Annalissa Murphey, M.D.  John-Adam Liticia Gasior, MD 12/29/12 0442 

## 2013-01-05 ENCOUNTER — Other Ambulatory Visit: Payer: Self-pay | Admitting: Cardiology

## 2013-01-05 ENCOUNTER — Telehealth: Payer: Self-pay | Admitting: Cardiology

## 2013-01-05 NOTE — Telephone Encounter (Signed)
Patient called no answer.LMTC. 

## 2013-01-05 NOTE — Telephone Encounter (Signed)
New Prob    Pt wanted to notify office his pharmacy will be calling in his refill.

## 2013-01-06 MED ORDER — METFORMIN HCL ER 500 MG PO TB24: 500.0000 mg | ORAL_TABLET | Freq: Every day | ORAL | Status: DC
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
## 2013-01-11 ENCOUNTER — Telehealth: Payer: Self-pay | Admitting: Cardiology

## 2013-01-11 NOTE — Telephone Encounter (Signed)
New Prob   Requesting new prescription of [facility] to [facility]. Also needs a 2 weeks supply prescription sent over to [facility] bc he states [facility] will take a little while.

## 2013-01-12 NOTE — Telephone Encounter (Signed)
Patient called no answer.LMTC. 

## 2013-01-12 NOTE — Telephone Encounter (Signed)
Follow up call    Patient is asking for a call back concerning his medication  .

## 2013-01-14 ENCOUNTER — Other Ambulatory Visit: Payer: Self-pay

## 2013-01-15 MED ORDER — PRASUGREL HCL 10 MG PO TABS: 10.0000 mg | ORAL_TABLET | Freq: Every day | ORAL | Status: DC

## 2013-01-15 MED ORDER — METFORMIN HCL ER 500 MG PO TB24: 500.0000 mg | ORAL_TABLET | Freq: Every day | ORAL | Status: DC

## 2013-01-15 NOTE — Telephone Encounter (Signed)
Patient called no answer.Left message on personal voice mail refill for effient sent to pharmacy.

## 2013-01-15 NOTE — Telephone Encounter (Signed)
Follow up call    Refill   effient   [facility] .

## 2013-01-15 NOTE — Addendum Note (Signed)
Addended by: Meda Klinefelter D on: 01/15/2013 11:18 AM   Modules accepted: Orders

## 2013-01-18 ENCOUNTER — Telehealth: Payer: Self-pay | Admitting: Cardiology

## 2013-01-18 MED ORDER — METFORMIN HCL ER 500 MG PO TB24: 500.0000 mg | ORAL_TABLET | Freq: Every day | ORAL | Status: DC

## 2013-01-18 NOTE — Telephone Encounter (Signed)
New problem     Metformin 500 mg    [facility] [address]
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
## 2013-01-20 NOTE — Telephone Encounter (Signed)
Spoke to patient was told Effient 10 mg samples left at front desk 3rd floor.

## 2013-02-01 ENCOUNTER — Observation Stay (HOSPITAL_COMMUNITY)
Admission: EM | Admit: 2013-02-01 | Discharge: 2013-02-02 | Disposition: A | Discharge status: Home / Self Care | Payer: BC Managed Care – PPO | Attending: Cardiology | Admitting: Cardiology

## 2013-02-01 ENCOUNTER — Encounter: Payer: Self-pay | Admitting: Physician Assistant

## 2013-02-01 ENCOUNTER — Encounter: Payer: Self-pay | Admitting: Emergency Medicine

## 2013-02-01 ENCOUNTER — Emergency Department (HOSPITAL_COMMUNITY): Payer: BC Managed Care – PPO

## 2013-02-01 ENCOUNTER — Encounter (HOSPITAL_COMMUNITY): Payer: Self-pay | Admitting: *Deleted

## 2013-02-01 DIAGNOSIS — E119 Type 2 diabetes mellitus without complications: Secondary | ICD-10-CM | POA: Insufficient documentation

## 2013-02-01 DIAGNOSIS — E1159 Type 2 diabetes mellitus with other circulatory complications: Secondary | ICD-10-CM | POA: Diagnosis present

## 2013-02-01 DIAGNOSIS — I209 Angina pectoris, unspecified: Secondary | ICD-10-CM | POA: Insufficient documentation

## 2013-02-01 DIAGNOSIS — E785 Hyperlipidemia, unspecified: Secondary | ICD-10-CM | POA: Diagnosis present

## 2013-02-01 DIAGNOSIS — I2510 Atherosclerotic heart disease of native coronary artery without angina pectoris: Secondary | ICD-10-CM | POA: Insufficient documentation

## 2013-02-01 DIAGNOSIS — R0989 Other specified symptoms and signs involving the circulatory and respiratory systems: Secondary | ICD-10-CM | POA: Insufficient documentation

## 2013-02-01 DIAGNOSIS — R0609 Other forms of dyspnea: Secondary | ICD-10-CM | POA: Insufficient documentation

## 2013-02-01 DIAGNOSIS — R42 Dizziness and giddiness: Secondary | ICD-10-CM | POA: Insufficient documentation

## 2013-02-01 DIAGNOSIS — R0602 Shortness of breath: Secondary | ICD-10-CM | POA: Insufficient documentation

## 2013-02-01 DIAGNOSIS — I10 Essential (primary) hypertension: Secondary | ICD-10-CM | POA: Insufficient documentation

## 2013-02-01 DIAGNOSIS — I252 Old myocardial infarction: Secondary | ICD-10-CM | POA: Insufficient documentation

## 2013-02-01 DIAGNOSIS — I25810 Atherosclerosis of coronary artery bypass graft(s) without angina pectoris: Secondary | ICD-10-CM

## 2013-02-01 DIAGNOSIS — R079 Chest pain, unspecified: Principal | ICD-10-CM | POA: Insufficient documentation

## 2013-02-01 DIAGNOSIS — N401 Enlarged prostate with lower urinary tract symptoms: Secondary | ICD-10-CM | POA: Insufficient documentation

## 2013-02-01 DIAGNOSIS — N138 Other obstructive and reflux uropathy: Secondary | ICD-10-CM | POA: Insufficient documentation

## 2013-02-01 DIAGNOSIS — F1011 Alcohol abuse, in remission: Secondary | ICD-10-CM | POA: Insufficient documentation

## 2013-02-01 LAB — CBC
HCT: 44.8 % (ref 39.0–52.0)
MCH: 26.8 pg (ref 26.0–34.0)
MCV: 76.6 fL — ABNORMAL LOW (ref 78.0–100.0)
Platelets: 164 10*3/uL (ref 150–400)
RBC: 5.73 MIL/uL (ref 4.22–5.81)
RDW: 16.1 % — ABNORMAL HIGH (ref 11.5–15.5)
WBC: 4.7 10*3/uL (ref 4.0–10.5)
WBC: 4.9 10*3/uL (ref 4.0–10.5)

## 2013-02-01 LAB — GLUCOSE, CAPILLARY
Glucose-Capillary: 124 mg/dL — ABNORMAL HIGH (ref 70–99)
Glucose-Capillary: 193 mg/dL — ABNORMAL HIGH (ref 70–99)

## 2013-02-01 LAB — BASIC METABOLIC PANEL
BUN: 20 mg/dL (ref 6–23)
CO2: 26 mEq/L (ref 19–32)
GFR calc non Af Amer: 53 mL/min — ABNORMAL LOW (ref >90)
Glucose, Bld: 157 mg/dL — ABNORMAL HIGH (ref 70–99)
Potassium: 3.9 mEq/L (ref 3.5–5.1)

## 2013-02-01 LAB — POCT I-STAT TROPONIN I

## 2013-02-01 LAB — CREATININE, SERUM: GFR calc Af Amer: 59 mL/min — ABNORMAL LOW (ref >90)

## 2013-02-01 MED ORDER — ASPIRIN 81 MG PO CHEW
CHEWABLE_TABLET | ORAL | Status: AC
  Filled 2013-02-01: qty 3

## 2013-02-01 MED ORDER — LOSARTAN POTASSIUM-HCTZ 100-25 MG PO TABS: 1.0000 | ORAL_TABLET | Freq: Every day | ORAL | Status: DC

## 2013-02-01 MED ORDER — ASPIRIN 81 MG PO CHEW
243.0000 mg | CHEWABLE_TABLET | Freq: Once | ORAL | Status: AC
  Administered 2013-02-01: 243 mg via ORAL

## 2013-02-01 MED ORDER — ACETAMINOPHEN 325 MG PO TABS: 650.0000 mg | ORAL_TABLET | ORAL | Status: DC | PRN

## 2013-02-01 MED ORDER — ATORVASTATIN CALCIUM 40 MG PO TABS
40.0000 mg | ORAL_TABLET | Freq: Every day | ORAL | Status: DC
  Filled 2013-02-01 (×2): qty 1

## 2013-02-01 MED ORDER — ENOXAPARIN SODIUM 40 MG/0.4ML ~~LOC~~ SOLN
40.0000 mg | SUBCUTANEOUS | Status: DC
  Administered 2013-02-01: 40 mg via SUBCUTANEOUS
  Filled 2013-02-01 (×2): qty 0.4

## 2013-02-01 MED ORDER — METOPROLOL TARTRATE 50 MG PO TABS
50.0000 mg | ORAL_TABLET | Freq: Two times a day (BID) | ORAL | Status: DC
  Administered 2013-02-01 – 2013-02-02 (×2): 50 mg via ORAL
  Filled 2013-02-01 (×3): qty 1

## 2013-02-01 MED ORDER — INSULIN ASPART 100 UNIT/ML ~~LOC~~ SOLN: 0.0000 [IU] | Freq: Every day | SUBCUTANEOUS | Status: DC

## 2013-02-01 MED ORDER — HYDROCHLOROTHIAZIDE 25 MG PO TABS
25.0000 mg | ORAL_TABLET | Freq: Every day | ORAL | Status: DC
  Administered 2013-02-02: 25 mg via ORAL
  Filled 2013-02-01: qty 1

## 2013-02-01 MED ORDER — ZOLPIDEM TARTRATE 5 MG PO TABS
5.0000 mg | ORAL_TABLET | Freq: Every evening | ORAL | Status: DC | PRN
  Administered 2013-02-02: 5 mg via ORAL
  Filled 2013-02-01: qty 1

## 2013-02-01 MED ORDER — INSULIN ASPART 100 UNIT/ML ~~LOC~~ SOLN
0.0000 [IU] | Freq: Three times a day (TID) | SUBCUTANEOUS | Status: DC
  Administered 2013-02-01: 2 [IU] via SUBCUTANEOUS
  Administered 2013-02-02: 3 [IU] via SUBCUTANEOUS

## 2013-02-01 MED ORDER — ALPRAZOLAM 0.25 MG PO TABS: 0.2500 mg | ORAL_TABLET | Freq: Two times a day (BID) | ORAL | Status: DC | PRN

## 2013-02-01 MED ORDER — TESTOSTERONE CYPIONATE 200 MG/ML IM SOLN: 200.0000 mg | INTRAMUSCULAR | Status: DC

## 2013-02-01 MED ORDER — SODIUM CHLORIDE 0.9 % IV SOLN: 250.0000 mL | INTRAVENOUS | Status: DC | PRN

## 2013-02-01 MED ORDER — NITROGLYCERIN 0.4 MG SL SUBL: 0.4000 mg | SUBLINGUAL_TABLET | SUBLINGUAL | Status: DC | PRN

## 2013-02-01 MED ORDER — LOSARTAN POTASSIUM 50 MG PO TABS
100.0000 mg | ORAL_TABLET | Freq: Every day | ORAL | Status: DC
  Administered 2013-02-02: 100 mg via ORAL
  Filled 2013-02-01: qty 2

## 2013-02-01 MED ORDER — PRASUGREL HCL 10 MG PO TABS
10.0000 mg | ORAL_TABLET | Freq: Every day | ORAL | Status: DC
  Administered 2013-02-02: 10 mg via ORAL
  Filled 2013-02-01: qty 1

## 2013-02-01 MED ORDER — SODIUM CHLORIDE 0.9 % IJ SOLN
3.0000 mL | Freq: Two times a day (BID) | INTRAMUSCULAR | Status: DC
  Administered 2013-02-01 – 2013-02-02 (×2): 3 mL via INTRAVENOUS

## 2013-02-01 MED ORDER — ONDANSETRON HCL 4 MG/2ML IJ SOLN: 4.0000 mg | Freq: Four times a day (QID) | INTRAMUSCULAR | Status: DC | PRN

## 2013-02-01 MED ORDER — SODIUM CHLORIDE 0.9 % IJ SOLN: 3.0000 mL | INTRAMUSCULAR | Status: DC | PRN

## 2013-02-01 MED ORDER — ADULT MULTIVITAMIN W/MINERALS CH
1.0000 | ORAL_TABLET | Freq: Every day | ORAL | Status: DC
  Administered 2013-02-01 – 2013-02-02 (×2): 1 via ORAL
  Filled 2013-02-01 (×2): qty 1

## 2013-02-01 MED ORDER — TRAMADOL HCL 50 MG PO TABS
50.0000 mg | ORAL_TABLET | Freq: Four times a day (QID) | ORAL | Status: DC | PRN
  Administered 2013-02-01 – 2013-02-02 (×2): 100 mg via ORAL
  Filled 2013-02-01: qty 2
  Filled 2013-02-01 (×2): qty 1

## 2013-02-01 MED ORDER — ASPIRIN EC 81 MG PO TBEC
81.0000 mg | DELAYED_RELEASE_TABLET | Freq: Every day | ORAL | Status: DC
  Administered 2013-02-02: 81 mg via ORAL
  Filled 2013-02-01: qty 1

## 2013-02-01 MED ORDER — TRAMADOL HCL 50 MG PO TABS: 50.0000 mg | ORAL_TABLET | Freq: Four times a day (QID) | ORAL | Status: DC | PRN

## 2013-02-01 MED ORDER — METFORMIN HCL ER 500 MG PO TB24
500.0000 mg | ORAL_TABLET | Freq: Every day | ORAL | Status: DC
  Administered 2013-02-02: 500 mg via ORAL
  Filled 2013-02-01 (×2): qty 1

## 2013-02-01 NOTE — Progress Notes (Signed)
Utilization review completed.  

## 2013-02-01 NOTE — ED Notes (Signed)
Cardiology at bedside.

## 2013-02-01 NOTE — ED Notes (Signed)
Patient transported to X-ray 

## 2013-02-01 NOTE — H&P (Signed)
History and Physical   Patient ID: Antonio [name] MRN: [id number], DOB/AGE: 65-Apr-1947 65 y.o. Date of Encounter: 02/01/2013  Primary Physician: Default, Provider, MD Primary Cardiologist:  PJ  Chief Complaint:  Chest pain  HPI: Antonio [name] is a 67 y.o. male with a history of CAD (s/p CABG x4) and HTN who presents with chest pain.  He was last cathed in 08/2012 which showed  severe 3v CAD with continued graft patency x 4. The recommendation was made to continue medical therapy. There was an area of diffuse dz in the distal LCx (up to 90%) deemed unamenable for PCI.    Since then he has had no issues until three days ago he started having an ache chest pain under his left breast that would radiate to his left arm and under his left eye.  He describes the pain as constant that waxes and wanes without exertion.  He states that he can be just sitting there and the pain can get worse.  Denies any fever, cough, SOB, diaphoresis, nausea, trauma to area, edema, orthopnea, PND, and syncope.    He states that he went for a walk yesterday with the CP and it did not get worse.  Last night he did take a NTG and the pain persisted causing him to take another one 10 min later that helped slightly.  This AM he went for a walk around the track and the pain did increase and he developed some SOB but attributes this to walking a greater distance than usual.  He came home and took 3 81mg  ASA which did help some.  He does state that about a week ago he became nauseated and weak at work and had to be taken home.  Denies CP or SOB at this time.  It was the second time in two weeks that this happened.    In the ED he was given 324mg  ASA, troponin neg x1, ecg shows no ST elevation or depression with flattened Twave unchanged from prior ECG.  His chest pain is reproducible to palpation but overall feels better.      Past Medical History  Diagnosis Date  . 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  HYPERCHOLESTEROLEMIA 12/17/2010  . HYPERTENSION 12/17/2010  . CAD 12/17/2010  . BENIGN PROSTATIC HYPERTROPHY, WITH OBSTRUCTION 12/17/2010  . Prostate cancer     s/p radiation   . Anginal pain   . Myocardial infarction 1996; 2006  . Type II diabetes mellitus 12/17/2010  . Exertional dyspnea   . Shortness of breath     "w/chest pain; sometimes when lying down" (09/03/2012)  . Bleeding per rectum     "related to prostate cancer and hemorrhoids" (09/03/2012)    Surgical History:  Past Surgical History  Procedure Laterality Date  . Cardiac catheterization  01/27/2009    ef 60%  . Coronary artery bypass graft  1996    LIMA GRAFT TO THE LAD, SAPHENOUS VEIN GRAFT SEQUENTIALLY TO THE FIRST AND SECOND OBTUSE MARGINAL VESSELS, SAPHENOUS VEIN GRAFT TO THE DIAGONAL, AND SAPHENOUS VEIN GRAFT TO THE DISTAL RIGHT CORONARY   . Laceration repair  1980's    LEFT HAND  . Foot surgery  1980's    LEFT, "shot a nail gun thru it" (09/03/2012)  . Cervical disc surgery  ? 1990's    "went in on the side" (09/03/2012)  . Cardiac catheterization  08/25/2012    Severe 3v obstructive CAD, continued graft patency (SVG-D,  SVG-OM1-OM2, SVG-PDA, LIMA-LAD). area of diffuse  dz in the distal LCx (up to 90%) unamenable to PCI     I have reviewed the patient's current medications. Medication Sig  aspirin EC 81 MG tablet Take 81 mg by mouth daily.  losartan-hydrochlorothiazide (HYZAAR) 100-25 MG per tablet Take 1 tablet by mouth daily.  metFORMIN (GLUCOPHAGE-XR) 500 MG 24 hr tablet Take 1 tablet (500 mg total) by mouth daily.  metoprolol (LOPRESSOR) 50 MG tablet Take 1 tablet (50 mg total) by mouth daily.  Multiple Vitamin (MULTIVITAMIN WITH MINERALS) TABS Take 1 tablet by mouth daily.  prasugrel (EFFIENT) 10 MG TABS Take 1 tablet (10 mg total) by mouth daily.  simvastatin (ZOCOR) 80 MG tablet Take 0.5 tablets (40 mg total) by mouth every morning.  testosterone cypionate (DEPOTESTOTERONE CYPIONATE) 200 MG/ML injection Inject  200 mg into the muscle once a week. wednesdays  traMADol (ULTRAM) 50 MG tablet Take 50 mg by mouth every 6 (six) hours as needed. FOR PAIN    Allergies:  Allergies  Allergen Reactions  . Demerol Other (See Comments)    hallucinations    History   Social History  . Marital Status: Married    Spouse Name: N/A    Number of Children: N/A  . Years of Education: N/A   Occupational History  . 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 Smoking status: Former Smoker -- 1.00 packs/day for 37 years    Types: Cigarettes    Quit date: 10/14/2002  . Smokeless tobacco: Never Used  . Alcohol Use: No  . Drug Use: No  . Sexually Active: Yes   Other Topics Concern  . Not on file   Social History Narrative   Regular exercise-yes    Family History  Problem Relation Age of Onset  . Diabetes Other     2 siblings, 1 of whom is deceased  . Hypertension Other   . Cancer Other     Lung Cancer  . Cancer Other     FH of Prostate Cancer 1st degree relative    Review of Systems:   Full 14-point review of systems otherwise negative except as noted above.  Physical Exam: Blood pressure 121/84, pulse 60, temperature 97 F (36.1 C), temperature source Oral, resp. rate 18, SpO2 100.00%. General: Well developed, well nourished,male in no acute distress. Head: Normocephalic, atraumatic, sclera non-icteric, no xanthomas, nares are without discharge. Dentition:  Neck: No carotid bruits. JVD not elevated. No thyromegally Lungs: Good expansion bilaterally. without wheezes or rhonchi.  Heart: Regular rate and rhythm with S1 S2.  No S3 or S4.  No murmur, no rubs, or gallops appreciated. Abdomen: Soft, non-tender, non-distended with normoactive bowel sounds. No hepatomegaly. No rebound/guarding. No obvious abdominal masses. Msk:  Strength and tone appear normal for age. No joint deformities or effusions, no spine or costo-vertebral angle tenderness. Extremities:  Left lower leg  tenderness without swelling or bruise. No clubbing or cyanosis. No edema.  Distal pedal pulses are 2+ in 4 extrem Neuro: Alert and oriented X 3. Moves all extremities spontaneously. No focal deficits noted. Psych:  Responds to questions appropriately with a normal affect. Skin: No rashes or lesions noted  Labs:   Lab Results  Component Value Date   WBC 4.7 02/01/2013   HGB 15.3 02/01/2013   HCT 43.7 02/01/2013   MCV 76.3* 02/01/2013   PLT 164 02/01/2013   No results found for this basename: INR,  in the last 72 hours   Recent Labs  Lab 02/01/13 1032  NA 134*  K 3.9  CL 98  CO2 26  BUN 20  CREATININE 1.36*  CALCIUM 9.6  GLUCOSE 157*   No results found for this basename: CKTOTAL, CKMB, TROPONINI,  in the last 72 hours  Recent Labs  02/01/13 1048  TROPIPOC 0.00    Radiology/Studies: Dg Chest 2 View 02/01/2013  *RADIOLOGY REPORT*  Clinical Data: Left-sided chest pain.  CHEST - 2 VIEW  Comparison: 09/03/2012  Findings: The heart size is stable status post prior CABG.  Stable mild tortuosity the thoracic aorta is present.  Lungs show no evidence of edema, infiltrate or nodule.  No pleural fluid is identified.  The bony thorax shows no abnormalities.  IMPRESSION: No active disease.  Stable heart size following prior CABG.   Original Report Authenticated By: Irish Lack, M.D.     Cardiac Cath: November 2013 Coronary angiography:  Coronary dominance: right  Left mainstem: Normal.  Left anterior descending (LAD): Diffuse 60-70% proximal. Occluded after the first septal perforator.  Left circumflex (LCx): Diffuse 60% disease proximally. The first and second marginal branches are occluded. There is a long segment of disease in the distal circumflex prior to the takeoff of the small terminal marginal branch. Within this segment of disease there is up to 90% stenosis. There is a 70% stenosis in the small terminal marginal branch.  Right coronary artery (RCA): The right coronary is  occluded in the mid vessel. There are bridging collaterals to the mid and distal right coronary.  The saphenous vein graft to the PDA is widely patent. There is a 30-40% narrowing in the proximal vein graft that is unchanged from 2010.  The saphenous vein graft to the first diagonal is widely patent. There is 30% disease in the proximal graft.  Saphenous vein graft sequentially to the first and second obtuse marginal vessels is widely patent.  The LIMA graft to the LAD is widely patent.  Left ventriculography: Not performed  Final Conclusions:  1. Severe three-vessel obstructive coronary disease.  2. Continued patency of all grafts including saphenous vein graft to the diagonal, deafness vein graft sequentially to the first and second obtuse marginal vessels, saphenous vein graft to the PDA, and LIMA graft to the LAD.    ECG: 01-Feb-2013 10:12:45   SINUS RHYTHM ~ normal P axis, V-rate 50- 99 PROBABLE LEFT ATRIAL ABNORMALITY ~ P >46mS, <-0.30mV V1 BORDERLINE T ABNORMALITIES, ANT-LAT LEADS ~ T flat/neg, I aVL V2-V6 Standard 12 Lead Report ~ Unconfirmed Interpretation Borderline ECG 38mm/s 51mm/mV 150Hz  8.0.1 12SL 235 CID: [id number] Referred by: Unconfirmed Vent. rate 59 BPM PR interval 172 ms QRS duration 90 ms QT/QTc 412/408 ms P-R-T axes 73 -2 70  ASSESSMENT AND PLAN:  Antonio [name] is a 67 y.o. male with a history of CAD (s/p CABG x4) and HTN who presents with chest pain.  He was last cathed in 08/2012 which showed  severe 3v CAD with continued graft patency x 4. The recommendation was made to continue medical therapy. There was an area of diffuse dz in the distal LCx (up to 90%) deemed unamenable for PCI.  He complains of a constant aching chest pain that does radiates to his left arm and under his left eye.  No associated symptoms.  Waxes and wanes but not exertional.  ASA did make it feel better.  Palpation to upper chest did reproduce some lesser pain.  Troponin was negative and ECG shows  NSR and non-specific T wave changes unchanged from prior  08/2012 study.  Principal Problem:   Intermediate coronary syndrome:  Probably musculoskeletal in origin. He had a recent cath last November.  Admit for observation of troponin.  ECG in the AM.  Pain control with home dose of ultram.  Active Problems:   DM:  Check A1C.  He glucose was elevated in the ED and its been 5 months since last check.   HYPERCHOLESTEROLEMIA:  Continue Zocor.   HYPERTENSION:  Increase BB to BID instead of Daily.     Signed, Henningsgaard, Herby Abraham, PA-C 02/01/2013 12:26 PM Beeper [phone]   As above, patient seen and examined. Briefly he is a 67 year old male with past medical history of coronary artery disease status post coronary artery bypass graft, hypertension, hyperlipidemia, prostate cancer, diabetes mellitus who presents for evaluation of chest pain. Note patient had his last cardiac catheterization in November of 2013. At that time he had patent grafts. There was potential ischemia in the distribution of the distal circumflex but this was felt to be difficult to approach percutaneously and medical therapy was recommended. The patient presents today with a three-day history of chest pain. It is in the left breast area and described as an ache. It radiates to his left upper extremity. It has been continuous without completely resolving. No associated symptoms. The pain is not pleuritic, positional or related to food. There is some increase with activities. There is some improvement with aspirin. Electrocardiogram shows sinus rhythm, nonspecific T-wave changes and left atrial enlargement. Initial troponin negative. Hemoglobin is normal but MCV is 76.3. Chest pain is atypical. Question musculoskeletal. Will admit and cycle enzymes. If negative he can be discharged tomorrow and followup with Dr. [place]. I would not pursue a functional study as recent catheterization showed patent grafts.  Would continue aspirin, beta blocker, ARB and statin. Patient should followup with his primary care for further evaluation of his microcytosis. Recheck hemoglobin tomorrow morning. Olga Millers  2:17 PM

## 2013-02-01 NOTE — ED Provider Notes (Signed)
History     CSN: [id number]  Arrival date & time 02/01/13  1007   First MD Initiated Contact with Patient 02/01/13 1023      Chief Complaint  Patient presents with  . Chest Pain    (Consider location/radiation/quality/duration/timing/severity/associated sxs/prior treatment) HPI Comments: He presents for evaluation of ongoing chest pain for 3 days. The pain improved when he took nitroglycerin yesterday, but did not resolve completely. He is concerned that this means his heart is a cause. This morning when he walk ed on  the track; he had an increase of his pain to 8/10. With rest the pain has improved to 4/10. While walking on the track, today, he had shortness of breath. He took aspirin this morning, but not nitroglycerin. He denies cough, fever, chills, back pain, weakness, or dizziness. He has not had recent stress test or cardiac catheterization. There are no other modifying factors.  Patient is a 67 y.o. male presenting with chest pain. The history is provided by the patient.  Chest Pain   Past Medical History  Diagnosis Date  . 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 HYPERCHOLESTEROLEMIA 12/17/2010  . HYPERTENSION 12/17/2010  . CAD 12/17/2010  . BENIGN PROSTATIC HYPERTROPHY, WITH OBSTRUCTION 12/17/2010  . Prostate cancer     s/p radiation   . Anginal pain   . Myocardial infarction 1996; 2006  . Type II diabetes mellitus 12/17/2010  . Exertional dyspnea   . Shortness of breath     "w/chest pain; sometimes when lying down" (09/03/2012)  . Bleeding per rectum     "related to prostate cancer and hemorrhoids" (09/03/2012)    Past Surgical History  Procedure Laterality Date  . Cardiac catheterization  01/27/2009    ef 60%  . Coronary artery bypass graft  1996    LIMA GRAFT TO THE LAD, SAPHENOUS VEIN GRAFT SEQUENTIALLY TO THE FIRST AND SECOND OBTUSE MARGINAL VESSELS, SAPHENOUS VEIN GRAFT TO THE DIAGONAL, AND SAPHENOUS VEIN GRAFT TO THE DISTAL RIGHT CORONARY   . Laceration repair   1980's    LEFT HAND  . Foot surgery  1980's    LEFT, "shot a nail gun thru it" (09/03/2012)  . Cervical disc surgery  ? 1990's    "went in on the side" (09/03/2012)  . Cardiac catheterization  08/25/2012    Severe 3v obstructive CAD, continued graft patency (SVG-D,  SVG-OM1-OM2, SVG-PDA, LIMA-LAD). area of diffuse dz in the distal LCx (up to 90%) unamenable to PCI    Family History  Problem Relation Age of Onset  . Diabetes Other     2 siblings, 1 of whom is deceased  . Hypertension Other   . Cancer Other     Lung Cancer  . Cancer Other     FH of Prostate Cancer 1st degree relative    History  Substance Use Topics  . Smoking status: Former Smoker -- 1.00 packs/day for 37 years    Types: Cigarettes    Quit date: 10/14/2002  . Smokeless tobacco: Never Used  . Alcohol Use: No      Review of Systems  Cardiovascular: Positive for chest pain.  All other systems reviewed and are negative.    Allergies  Demerol  Home Medications   No current outpatient prescriptions on file.  BP 120/83  Pulse 60  Temp(Src) 98 F (36.7 C) (Oral)  Resp 18  SpO2 98%  Physical Exam  Nursing note and vitals reviewed. Constitutional: He is oriented to person, place, and time. He appears  well-developed and well-nourished.  HENT:  Head: Normocephalic and atraumatic.  Right Ear: External ear normal.  Left Ear: External ear normal.  Eyes: Conjunctivae and EOM are normal. Pupils are equal, round, and reactive to light.  Neck: Normal range of motion and phonation normal. Neck supple.  Cardiovascular: Normal rate, regular rhythm, normal heart sounds and intact distal pulses.   Pulmonary/Chest: Effort normal and breath sounds normal. He exhibits no bony tenderness.  Abdominal: Soft. Normal appearance. There is no tenderness.  Musculoskeletal: Normal range of motion. He exhibits tenderness (Left lower leg, mild, without swelling or deformity).  Neurological: He is alert and oriented to  person, place, and time. He has normal strength. No cranial nerve deficit or sensory deficit. He exhibits normal muscle tone. Coordination normal.  Skin: Skin is warm, dry and intact.  Psychiatric: He has a normal mood and affect. His behavior is normal. Judgment and thought content normal.    ED Course  Procedures (including critical care time)  Medications  nitroGLYCERIN (NITROSTAT) SL tablet 0.4 mg (not administered)  acetaminophen (TYLENOL) tablet 650 mg (not administered)  ondansetron (ZOFRAN) injection 4 mg (not administered)  ALPRAZolam (XANAX) tablet 0.25 mg (not administered)  sodium chloride 0.9 % injection 3 mL (not administered)  sodium chloride 0.9 % injection 3 mL (not administered)  0.9 %  sodium chloride infusion (not administered)  enoxaparin (LOVENOX) injection 40 mg (not administered)  zolpidem (AMBIEN) tablet 5 mg (not administered)  aspirin EC tablet 81 mg (not administered)  metFORMIN (GLUCOPHAGE-XR) 24 hr tablet 500 mg (not administered)  metoprolol tartrate (LOPRESSOR) tablet 50 mg (not administered)  multivitamin with minerals tablet 1 tablet (not administered)  prasugrel (EFFIENT) tablet 10 mg (not administered)  atorvastatin (LIPITOR) tablet 40 mg (not administered)  insulin aspart (novoLOG) injection 0-15 Units (not administered)  insulin aspart (novoLOG) injection 0-5 Units (not administered)  traMADol (ULTRAM) tablet 50-100 mg (100 mg Oral Given 02/01/13 1347)  losartan (COZAAR) tablet 100 mg (not administered)    And  hydrochlorothiazide (HYDRODIURIL) tablet 25 mg (not administered)  aspirin chewable tablet 243 mg (243 mg Oral Given 02/01/13 1024)   Consultation:11:14- discuss with cardiology, Dr. [place], who will see and evaluate the patient, in the ED.   Date: 07/31/2012  Rate: 59  Rhythm: normal sinus rhythm  QRS Axis: normal  PR and QT Intervals: normal  ST/T Wave abnormalities: nonspecific T wave changes  PR and QRS Conduction  Disutrbances:none  Narrative Interpretation:   Old EKG Reviewed: unchanged   Labs Reviewed  CBC - Abnormal; Notable for the following:    MCV 76.3 (*)    RDW 16.1 (*)    All other components within normal limits  BASIC METABOLIC PANEL - Abnormal; Notable for the following:    Sodium 134 (*)    Glucose, Bld 157 (*)    Creatinine, Ser 1.36 (*)    GFR calc non Af Amer 53 (*)    GFR calc Af Amer 62 (*)    All other components within normal limits  CBC - Abnormal; Notable for the following:    RBC 5.85 (*)    MCV 76.6 (*)    RDW 16.1 (*)    All other components within normal limits  CREATININE, SERUM - Abnormal; Notable for the following:    Creatinine, Ser 1.42 (*)    GFR calc non Af Amer 51 (*)    GFR calc Af Amer 59 (*)    All other components within normal limits  GLUCOSE, CAPILLARY -  Abnormal; Notable for the following:    Glucose-Capillary 124 (*)    All other components within normal limits  TROPONIN I  TROPONIN I  TROPONIN I  HEMOGLOBIN A1C  COMPREHENSIVE METABOLIC PANEL  POCT I-STAT TROPONIN I   Dg Chest 2 View  02/01/2013  *RADIOLOGY REPORT*  Clinical Data: Left-sided chest pain.  CHEST - 2 VIEW  Comparison: 09/03/2012  Findings: The heart size is stable status post prior CABG.  Stable mild tortuosity the thoracic aorta is present.  Lungs show no evidence of edema, infiltrate or nodule.  No pleural fluid is identified.  The bony thorax shows no abnormalities.  IMPRESSION: No active disease.  Stable heart size following prior CABG.   Original Report Authenticated By: Irish Lack, M.D.    Nursing Notes Reviewed/ Care Coordinated, and agree without changes. Applicable Imaging Reviewed.  Interpretation of Laboratory Data incorporated into ED treatment  1. Acute chest pain   2. Angina pectoris       MDM  Chest pain, atypical for cardiac disease. Cardiac catheterization last done 11/13. At that time, revascularized vessels were patent. Initial ED evaluation,  negative for acute injury. No good ultimate diagnosis, for his pain.   plan: Admit to cardiology     Flint Melter, MD 02/01/13 1714

## 2013-02-01 NOTE — ED Notes (Signed)
Pt c/o left sided CP intermittently x 1 week; pt denies SOB

## 2013-02-01 NOTE — ED Notes (Signed)
Pt c/o L sided CP onset x 3 days radiating L arm, Pt denies SOB, N/V/D, pt reports taking x 2 SL nitro yesterday without relief, pt denies taking Nitro today, pt reports taking x 1 ASA 81 mg today, pt A&O x 4, follows commands,speaks in complete sentences, pt hx of CABG in 1996

## 2013-02-02 DIAGNOSIS — E119 Type 2 diabetes mellitus without complications: Secondary | ICD-10-CM

## 2013-02-02 DIAGNOSIS — R079 Chest pain, unspecified: Secondary | ICD-10-CM

## 2013-02-02 DIAGNOSIS — I25810 Atherosclerosis of coronary artery bypass graft(s) without angina pectoris: Secondary | ICD-10-CM

## 2013-02-02 DIAGNOSIS — I10 Essential (primary) hypertension: Secondary | ICD-10-CM

## 2013-02-02 LAB — COMPREHENSIVE METABOLIC PANEL
Albumin: 3.4 g/dL — ABNORMAL LOW (ref 3.5–5.2)
BUN: 22 mg/dL (ref 6–23)
Creatinine, Ser: 1.43 mg/dL — ABNORMAL HIGH (ref 0.50–1.35)
Potassium: 3.5 mEq/L (ref 3.5–5.1)
Total Protein: 6.8 g/dL (ref 6.0–8.3)

## 2013-02-02 LAB — GLUCOSE, CAPILLARY
Glucose-Capillary: 166 mg/dL — ABNORMAL HIGH (ref 70–99)
Glucose-Capillary: 119 mg/dL — ABNORMAL HIGH (ref 70–99)

## 2013-02-02 MED ORDER — NITROGLYCERIN 0.4 MG SL SUBL: 0.4000 mg | SUBLINGUAL_TABLET | SUBLINGUAL | Status: DC | PRN

## 2013-02-02 MED ORDER — METOPROLOL TARTRATE 50 MG PO TABS: 50.0000 mg | ORAL_TABLET | Freq: Two times a day (BID) | ORAL | Status: DC

## 2013-02-02 NOTE — Discharge Summary (Signed)
Patient ID: Antonio [name],  MRN: [id number], DOB/AGE: [birth year] 65 y.o.  Admit date: 02/01/2013 Discharge date: 02/02/2013  Primary Care Provider: Default, Provider Primary Cardiologist: P. Swaziland, MD  Discharge Diagnoses Principal Problem:   Midsternal chest pain Active Problems:   CAD (coronary artery disease) of artery bypass graft   DM   HYPERCHOLESTEROLEMIA   HYPERTENSION  Allergies Allergies  Allergen Reactions  . Demerol Other (See Comments)    hallucinations   Procedures  None  History of Present Illness  67 y/o male with h/o CAD s/p CABG x 4, who was in his USOH until approximately 3 days prior to admission, when he began to experience constant chest discomfort radiating to his left arm and face, intermittently worsening in the absence of exertion, but never fully going away.  He had worsening upon exertion on the morning of admission prompting him to present to the ED for further evaluation.  There, ECG was non-acute and troponin was normal.  He was observed for further evaluation.  Hospital Course  Pt ruled out for MI.  He continued to have low-grade, constant chest discomfort, similar to what he had at the time of his last catheterization in 08/2012.  At that time, cath revealed patent grafts and stable anatomy.  As a result, in the absence of objective evidence of ischemia, it was felt that he was stable for discharge without further ischemic testing.  He has been advised to continue his previous home medications and avoid heavy lifting at work.  He will be discharged home today in good condition.  Discharge Vitals Blood pressure 102/60, pulse 18, temperature 98.5 F (36.9 C), temperature source Oral, resp. rate 18, height 5\' 7"  (1.702 m), weight 175 lb 14.8 oz (79.8 kg), SpO2 98.00%.  Filed Weights   02/01/13 1454 02/02/13 0628  Weight: 175 lb 11.2 oz (79.697 kg) 175 lb 14.8 oz (79.8 kg)   Labs  CBC  Recent Labs  02/01/13 1032 02/01/13 1502  WBC 4.7 4.9   HGB 15.3 15.7  HCT 43.7 44.8  MCV 76.3* 76.6*  PLT 164 162   Basic Metabolic Panel  Recent Labs  02/01/13 1032 02/01/13 1502 02/02/13 0159  NA 134*  --  135  K 3.9  --  3.5  CL 98  --  97  CO2 26  --  29  GLUCOSE 157*  --  125*  BUN 20  --  22  CREATININE 1.36* 1.42* 1.43*  CALCIUM 9.6  --  8.9   Liver Function Tests  Recent Labs  02/02/13 0159  AST 19  ALT 14  ALKPHOS 57  BILITOT 0.3  PROT 6.8  ALBUMIN 3.4*   Cardiac Enzymes  Recent Labs  02/01/13 1502 02/01/13 2024 02/02/13 0159  TROPONINI <0.30 <0.30 <0.30   Hemoglobin A1C  Recent Labs  02/01/13 1502  HGBA1C 8.6*   Disposition  Pt is being discharged home today in good condition.  Follow-up Plans & Appointments  Follow-up Information   Follow up with Norma Fredrickson, NP On 03/03/2013. (9:00 AM - Dr. Elvis Coil Nurse Practitioner)    Contact information:   [address] [place] [address] [phone]     Discharge Medications    Medication List    TAKE these medications       aspirin EC 81 MG tablet  Take 81 mg by mouth daily.     losartan-hydrochlorothiazide 100-25 MG per tablet  Commonly known as:  HYZAAR  Take 1 tablet by mouth daily.  metFORMIN 500 MG 24 hr tablet  Commonly known as:  GLUCOPHAGE-XR  Take 1 tablet (500 mg total) by mouth daily.     metoprolol 50 MG tablet  Commonly known as:  LOPRESSOR  Take 1 tablet (50 mg total) by mouth 2 (two) times daily.     multivitamin with minerals Tabs  Take 1 tablet by mouth daily.     nitroGLYCERIN 0.4 MG SL tablet  Commonly known as:  NITROSTAT  Place 1 tablet (0.4 mg total) under the tongue every 5 (five) minutes x 3 doses as needed for chest pain.     prasugrel 10 MG Tabs  Commonly known as:  EFFIENT  Take 1 tablet (10 mg total) by mouth daily.     simvastatin 80 MG tablet  Commonly known as:  ZOCOR  Take 0.5 tablets (40 mg total) by mouth every morning.     testosterone cypionate 200 MG/ML  injection  Commonly known as:  DEPOTESTOTERONE CYPIONATE  Inject 200 mg into the muscle once a week. wednesdays     traMADol 50 MG tablet  Commonly known as:  ULTRAM  Take 50 mg by mouth every 6 (six) hours as needed. FOR PAIN      Outstanding Labs/Studies  None  Duration of Discharge Encounter   Greater than 30 minutes including physician time.  Signed, Nicolasa Ducking NP 02/02/2013, 12:16 PM
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
## 2013-02-02 NOTE — Discharge Summary (Signed)
Patient seen and examined and history reviewed. Agree with above findings and plan. See my earlier note.  Antonio [name] 02/02/2013 3:10 PM

## 2013-02-18 ENCOUNTER — Other Ambulatory Visit: Payer: Self-pay

## 2013-02-18 MED ORDER — PRASUGREL HCL 10 MG PO TABS: 10.0000 mg | ORAL_TABLET | Freq: Every day | ORAL | Status: DC

## 2013-02-24 ENCOUNTER — Telehealth: Payer: Self-pay | Admitting: Cardiology

## 2013-02-24 NOTE — Telephone Encounter (Signed)
New Prob      Pt requesting samples of EFFIENT.

## 2013-02-24 NOTE — Telephone Encounter (Signed)
Returned call to patient office out of effient samples. 

## 2013-03-03 ENCOUNTER — Encounter: Payer: BC Managed Care – PPO | Admitting: Nurse Practitioner

## 2013-03-04 ENCOUNTER — Encounter: Payer: BC Managed Care – PPO | Admitting: Cardiology

## 2013-03-09 ENCOUNTER — Telehealth: Payer: Self-pay | Admitting: Cardiology

## 2013-03-09 DIAGNOSIS — I2510 Atherosclerotic heart disease of native coronary artery without angina pectoris: Secondary | ICD-10-CM

## 2013-03-09 MED ORDER — PRASUGREL HCL 10 MG PO TABS: 10.0000 mg | ORAL_TABLET | Freq: Every day | ORAL | Status: DC

## 2013-03-09 NOTE — Telephone Encounter (Signed)
I spoke with the patient and made him aware no samples in the office. He does need an RX sent to the [facility] on [address] The patient is aware an RX will be sent.

## 2013-03-09 NOTE — Telephone Encounter (Signed)
New Prob      Requesting some samples of EFFIENT.

## 2013-03-24 ENCOUNTER — Telehealth: Payer: Self-pay | Admitting: Nurse Practitioner

## 2013-03-24 ENCOUNTER — Other Ambulatory Visit: Payer: Self-pay | Admitting: Nurse Practitioner

## 2013-03-24 MED ORDER — LOSARTAN POTASSIUM-HCTZ 100-25 MG PO TABS: 1.0000 | ORAL_TABLET | Freq: Every day | ORAL | Status: DC

## 2013-03-24 NOTE — Telephone Encounter (Signed)
Pt called in this evening stating that he ran out of his losartan-hctz about 3 days ago and his bp has been running high.  He is asymptomatic.  He has f/u with Dr. Swaziland on 8/1.  I sent in an escript to renew his current dose of losartan HCTZ 100-25mg  1 po daily, #30, six refills.  He was grateful for call back.

## 2013-04-01 ENCOUNTER — Telehealth: Payer: Self-pay | Admitting: Cardiology

## 2013-04-01 NOTE — Telephone Encounter (Signed)
Lmtcb. Debbie Monty Spicher RN  

## 2013-04-01 NOTE — Telephone Encounter (Signed)
New Prob     Pt states he has some questions regarding getting an OK to get a glucometer and the strips. Please call.

## 2013-04-07 NOTE — Telephone Encounter (Signed)
Returned call to patient he stated he wants to get a glucometer but has forgot the name and phone number of the company who his purchasing this from.Stated he will call back with the name and phone number.

## 2013-04-27 ENCOUNTER — Other Ambulatory Visit: Payer: Self-pay | Admitting: *Deleted

## 2013-05-14 ENCOUNTER — Encounter: Payer: Self-pay | Admitting: Cardiology

## 2013-05-14 ENCOUNTER — Ambulatory Visit (INDEPENDENT_AMBULATORY_CARE_PROVIDER_SITE_OTHER): Payer: BC Managed Care – PPO | Admitting: Cardiology

## 2013-05-14 VITALS — BP 142/86 | HR 81 | Ht 67.0 in | Wt 179.8 lb

## 2013-05-14 DIAGNOSIS — I2510 Atherosclerotic heart disease of native coronary artery without angina pectoris: Secondary | ICD-10-CM

## 2013-05-14 DIAGNOSIS — E7800 Pure hypercholesterolemia, unspecified: Secondary | ICD-10-CM

## 2013-05-14 DIAGNOSIS — I10 Essential (primary) hypertension: Secondary | ICD-10-CM
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
## 2013-05-14 NOTE — Patient Instructions (Signed)
Continue your current medication  I will see you in 6 months.   

## 2013-06-02 ENCOUNTER — Telehealth: Payer: Self-pay

## 2013-06-02 DIAGNOSIS — I2510 Atherosclerotic heart disease of native coronary artery without angina pectoris: Secondary | ICD-10-CM

## 2013-06-02 MED ORDER — METFORMIN HCL ER 500 MG PO TB24: 500.0000 mg | ORAL_TABLET | Freq: Every day | ORAL | Status: DC

## 2013-06-02 MED ORDER — PRASUGREL HCL 10 MG PO TABS: 10.0000 mg | ORAL_TABLET | Freq: Every day | ORAL | Status: DC

## 2013-06-02 NOTE — Telephone Encounter (Signed)
Received call from patient he stated he needed refills for metformin and effient.Stated he will get a PCP in 11/14 when he turns 67.Refills sent to pharmacy.Samples of effient 10 mg left at front desk 3rd floor.

## 2013-06-08 ENCOUNTER — Telehealth: Payer: Self-pay

## 2013-06-08 NOTE — Telephone Encounter (Signed)
refill 

## 2013-06-08 NOTE — Telephone Encounter (Signed)
Patient called he stated a out of town pharmacy called him today doing a survey for his diabetes.Stated by doing the survey they were going to send him a free blood sugar monitor and strips.Stated he does not remember name of pharmacy that called him.Stated he already has a blood sugar monitor and strips,will be getting a PCP to manage his diabetes when he turns 67 years old.

## 2013-06-09 ENCOUNTER — Telehealth: Payer: Self-pay

## 2013-06-09 NOTE — Telephone Encounter (Signed)
Called [facility] about refilling patient's diabetic supply, they could not take a verbal order. They are going to fax a rx. Ok to fill per Dr Swaziland

## 2013-06-18 ENCOUNTER — Telehealth: Payer: Self-pay | Admitting: Cardiology

## 2013-06-18 ENCOUNTER — Telehealth: Payer: Self-pay

## 2013-06-18 ENCOUNTER — Encounter (HOSPITAL_COMMUNITY): Payer: Self-pay | Admitting: Adult Health

## 2013-06-18 ENCOUNTER — Emergency Department (HOSPITAL_COMMUNITY)
Admission: EM | Admit: 2013-06-18 | Discharge: 2013-06-18 | Disposition: A | Discharge status: Home / Self Care | Payer: BC Managed Care – PPO | Attending: Emergency Medicine | Admitting: Emergency Medicine

## 2013-06-18 DIAGNOSIS — Z8546 Personal history of malignant neoplasm of prostate: Secondary | ICD-10-CM | POA: Insufficient documentation

## 2013-06-18 DIAGNOSIS — Z7982 Long term (current) use of aspirin: Secondary | ICD-10-CM | POA: Insufficient documentation

## 2013-06-18 DIAGNOSIS — I252 Old myocardial infarction: Secondary | ICD-10-CM | POA: Insufficient documentation

## 2013-06-18 DIAGNOSIS — Z79899 Other long term (current) drug therapy: Secondary | ICD-10-CM | POA: Insufficient documentation

## 2013-06-18 DIAGNOSIS — Z87891 Personal history of nicotine dependence: Secondary | ICD-10-CM | POA: Insufficient documentation

## 2013-06-18 DIAGNOSIS — R319 Hematuria, unspecified: Secondary | ICD-10-CM | POA: Insufficient documentation

## 2013-06-18 DIAGNOSIS — E119 Type 2 diabetes mellitus without complications: Secondary | ICD-10-CM | POA: Insufficient documentation

## 2013-06-18 DIAGNOSIS — I2510 Atherosclerotic heart disease of native coronary artery without angina pectoris: Secondary | ICD-10-CM | POA: Insufficient documentation

## 2013-06-18 DIAGNOSIS — Z951 Presence of aortocoronary bypass graft: Secondary | ICD-10-CM | POA: Insufficient documentation

## 2013-06-18 DIAGNOSIS — E7800 Pure hypercholesterolemia, unspecified: Secondary | ICD-10-CM | POA: Insufficient documentation

## 2013-06-18 DIAGNOSIS — Z9861 Coronary angioplasty status: Secondary | ICD-10-CM | POA: Insufficient documentation

## 2013-06-18 DIAGNOSIS — I10 Essential (primary) hypertension: Secondary | ICD-10-CM | POA: Insufficient documentation

## 2013-06-18 LAB — BASIC METABOLIC PANEL
BUN: 20 mg/dL (ref 6–23)
Calcium: 8.8 mg/dL (ref 8.4–10.5)
Creatinine, Ser: 1.48 mg/dL — ABNORMAL HIGH (ref 0.50–1.35)
GFR calc Af Amer: 56 mL/min — ABNORMAL LOW (ref >90)
GFR calc non Af Amer: 48 mL/min — ABNORMAL LOW (ref >90)

## 2013-06-18 LAB — URINALYSIS, ROUTINE W REFLEX MICROSCOPIC
Bilirubin Urine: NEGATIVE
Glucose, UA: NEGATIVE mg/dL
Ketones, ur: NEGATIVE mg/dL
Protein, ur: >300 mg/dL — AB
pH: 6.5 (ref 5.0–8.0)

## 2013-06-18 LAB — URINE MICROSCOPIC-ADD ON

## 2013-06-18 LAB — CBC WITH DIFFERENTIAL/PLATELET
Basophils Absolute: 0 10*3/uL (ref 0.0–0.1)
Basophils Relative: 1 % (ref 0–1)
Eosinophils Absolute: 0.3 10*3/uL (ref 0.0–0.7)
Eosinophils Relative: 7 % — ABNORMAL HIGH (ref 0–5)
HCT: 38.9 % — ABNORMAL LOW (ref 39.0–52.0)
Hemoglobin: 13.5 g/dL (ref 13.0–17.0)
MCH: 30 pg (ref 26.0–34.0)
MCHC: 34.7 g/dL (ref 30.0–36.0)
Monocytes Absolute: 0.4 10*3/uL (ref 0.1–1.0)
Monocytes Relative: 8 % (ref 3–12)
RDW: 15.7 % — ABNORMAL HIGH (ref 11.5–15.5)

## 2013-06-18 MED ORDER — TRAMADOL HCL 50 MG PO TABS: 50.0000 mg | ORAL_TABLET | Freq: Four times a day (QID) | ORAL | Status: DC | PRN

## 2013-06-18 NOTE — ED Notes (Signed)
Reports penile bloody drainage and hematuria that began this evening, prostate biopsy done 3 weeks ago, HX of same with cauterization of vessels. Reports slow steady light drip, urinating clots, denies  Urinary retention

## 2013-06-18 NOTE — Telephone Encounter (Signed)
Spoke with Norma Fredrickson NP she advised ok to hold effient and aspirin.Patient stated he will be seeing urology.
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
## 2013-06-18 NOTE — ED Provider Notes (Signed)
Medical screening examination/treatment/procedure(s) were performed by non-physician practitioner and as supervising physician I was immediately available for consultation/collaboration.  Layla Maw Aleira Deiter, DO 06/18/13 [phone]

## 2013-06-18 NOTE — ED Notes (Signed)
Nurse First Rounds : Nurse explained delay , process and wait time to pt.  

## 2013-06-18 NOTE — Telephone Encounter (Signed)
Returned call to patient he stated he had to go to [facility] ER last night.Stated started urinating bright red blood.Stated was told to hold effient and asprin.Stated he will be making appointment with urology,wanting to make sure with Dr.Jordan if ok to hold effient and aspirin.Dr.Jordan out of office

## 2013-06-18 NOTE — Telephone Encounter (Signed)
New Problem  Pt went to ER, urinating blood// was advised from ER Dr. To stop taking effient// pt request a call back to confirm that this is a good decision.

## 2013-06-18 NOTE — Telephone Encounter (Signed)
Received a fax request from [facility] for blood sugar meter order signed by Dr.Jordan and faxed to fax # [phone].

## 2013-09-21 ENCOUNTER — Telehealth: Payer: Self-pay | Admitting: *Deleted

## 2013-09-21 NOTE — Telephone Encounter (Signed)
Patient requests effient samples. Patient aware that they will be left at the front desk for pick up.

## 2013-09-22 ENCOUNTER — Other Ambulatory Visit: Payer: Self-pay

## 2013-09-22 MED ORDER — SIMVASTATIN 80 MG PO TABS: 40.0000 mg | ORAL_TABLET | Freq: Every morning | ORAL | Status: DC

## 2013-10-29 ENCOUNTER — Telehealth: Payer: Self-pay | Admitting: *Deleted

## 2013-10-29 NOTE — Telephone Encounter (Signed)
Patient requests effient samples. He is aware that they will be left at the front desk for pick up.

## 2013-10-30 ENCOUNTER — Emergency Department (HOSPITAL_COMMUNITY)
Admission: EM | Admit: 2013-10-30 | Discharge: 2013-10-31 | Disposition: A | Discharge status: Home / Self Care | Payer: BC Managed Care – PPO | Attending: Emergency Medicine | Admitting: Emergency Medicine

## 2013-10-30 ENCOUNTER — Encounter (HOSPITAL_COMMUNITY): Payer: Self-pay | Admitting: Emergency Medicine

## 2013-10-30 DIAGNOSIS — E119 Type 2 diabetes mellitus without complications: Secondary | ICD-10-CM | POA: Insufficient documentation

## 2013-10-30 DIAGNOSIS — I209 Angina pectoris, unspecified: Secondary | ICD-10-CM | POA: Insufficient documentation

## 2013-10-30 DIAGNOSIS — Z8719 Personal history of other diseases of the digestive system: Secondary | ICD-10-CM | POA: Insufficient documentation

## 2013-10-30 DIAGNOSIS — E7800 Pure hypercholesterolemia, unspecified: Secondary | ICD-10-CM | POA: Insufficient documentation

## 2013-10-30 DIAGNOSIS — Z95818 Presence of other cardiac implants and grafts: Secondary | ICD-10-CM | POA: Insufficient documentation

## 2013-10-30 DIAGNOSIS — Z8546 Personal history of malignant neoplasm of prostate: Secondary | ICD-10-CM | POA: Insufficient documentation

## 2013-10-30 DIAGNOSIS — H538 Other visual disturbances: Secondary | ICD-10-CM | POA: Insufficient documentation

## 2013-10-30 DIAGNOSIS — Z87448 Personal history of other diseases of urinary system: Secondary | ICD-10-CM | POA: Insufficient documentation

## 2013-10-30 DIAGNOSIS — Z923 Personal history of irradiation: Secondary | ICD-10-CM | POA: Insufficient documentation

## 2013-10-30 DIAGNOSIS — R739 Hyperglycemia, unspecified: Secondary | ICD-10-CM

## 2013-10-30 DIAGNOSIS — I10 Essential (primary) hypertension: Secondary | ICD-10-CM | POA: Insufficient documentation

## 2013-10-30 DIAGNOSIS — F1021 Alcohol dependence, in remission: Secondary | ICD-10-CM | POA: Insufficient documentation

## 2013-10-30 DIAGNOSIS — R5381 Other malaise: Secondary | ICD-10-CM | POA: Insufficient documentation

## 2013-10-30 DIAGNOSIS — E860 Dehydration: Secondary | ICD-10-CM

## 2013-10-30 DIAGNOSIS — R5383 Other fatigue: Secondary | ICD-10-CM

## 2013-10-30 DIAGNOSIS — Z79899 Other long term (current) drug therapy: Secondary | ICD-10-CM | POA: Insufficient documentation

## 2013-10-30 DIAGNOSIS — R42 Dizziness and giddiness: Secondary | ICD-10-CM | POA: Insufficient documentation

## 2013-10-30 DIAGNOSIS — I2510 Atherosclerotic heart disease of native coronary artery without angina pectoris: Secondary | ICD-10-CM | POA: Insufficient documentation

## 2013-10-30 DIAGNOSIS — Z7982 Long term (current) use of aspirin: Secondary | ICD-10-CM | POA: Insufficient documentation

## 2013-10-30 DIAGNOSIS — Z951 Presence of aortocoronary bypass graft: Secondary | ICD-10-CM | POA: Insufficient documentation

## 2013-10-30 DIAGNOSIS — I252 Old myocardial infarction: Secondary | ICD-10-CM | POA: Insufficient documentation

## 2013-10-30 DIAGNOSIS — Z87891 Personal history of nicotine dependence: Secondary | ICD-10-CM | POA: Insufficient documentation

## 2013-10-30 LAB — CBC WITH DIFFERENTIAL/PLATELET
Basophils Absolute: 0 K/uL (ref 0.0–0.1)
Basophils Relative: 0 % (ref 0–1)
Eosinophils Absolute: 0.1 K/uL (ref 0.0–0.7)
Eosinophils Relative: 2 % (ref 0–5)
HCT: 45 % (ref 39.0–52.0)
Hemoglobin: 16.4 g/dL (ref 13.0–17.0)
Lymphocytes Relative: 41 % (ref 12–46)
Lymphs Abs: 2.3 K/uL (ref 0.7–4.0)
MCH: 31.1 pg (ref 26.0–34.0)
MCHC: 36.4 g/dL — ABNORMAL HIGH (ref 30.0–36.0)
MCV: 85.4 fL (ref 78.0–100.0)
Monocytes Absolute: 0.3 10*3/uL (ref 0.1–1.0)
Monocytes Relative: 6 % (ref 3–12)
Neutro Abs: 2.8 10*3/uL (ref 1.7–7.7)
Neutrophils Relative %: 52 % (ref 43–77)
Platelets: 173 K/uL (ref 150–400)
RBC: 5.27 MIL/uL (ref 4.22–5.81)
RDW: 14.2 % (ref 11.5–15.5)
WBC: 5.5 K/uL (ref 4.0–10.5)

## 2013-10-30 LAB — POCT I-STAT, CHEM 8
BUN: 20 mg/dL (ref 6–23)
CHLORIDE: 90 meq/L — AB (ref 96–112)
Calcium, Ion: 1.23 mmol/L (ref 1.13–1.30)
Creatinine, Ser: 1.5 mg/dL — ABNORMAL HIGH (ref 0.50–1.35)
Glucose, Bld: 377 mg/dL — ABNORMAL HIGH (ref 70–99)
HEMATOCRIT: 51 % (ref 39.0–52.0)
Hemoglobin: 17.3 g/dL — ABNORMAL HIGH (ref 13.0–17.0)
Potassium: 4 mEq/L (ref 3.7–5.3)
SODIUM: 132 meq/L — AB (ref 137–147)
TCO2: 31 mmol/L (ref 0–100)

## 2013-10-30 LAB — GLUCOSE, CAPILLARY
GLUCOSE-CAPILLARY: 352 mg/dL — AB (ref 70–99)
Glucose-Capillary: 303 mg/dL — ABNORMAL HIGH (ref 70–99)
Glucose-Capillary: 231 mg/dL — ABNORMAL HIGH (ref 70–99)

## 2013-10-30 MED ORDER — METFORMIN HCL ER (OSM) 1000 MG PO TB24: 1000.0000 mg | ORAL_TABLET | Freq: Every day | ORAL | Status: DC

## 2013-10-30 MED ORDER — SODIUM CHLORIDE 0.9 % IV SOLN
Freq: Once | INTRAVENOUS | Status: AC
  Administered 2013-10-30: 22:00:00 via INTRAVENOUS

## 2013-10-30 MED ORDER — SODIUM CHLORIDE 0.9 % IV BOLUS (SEPSIS)
1000.0000 mL | Freq: Once | INTRAVENOUS | Status: AC
  Administered 2013-10-30: 1000 mL via INTRAVENOUS

## 2013-10-30 MED ORDER — INSULIN ASPART 100 UNIT/ML ~~LOC~~ SOLN
8.0000 [IU] | Freq: Once | SUBCUTANEOUS | Status: AC
  Administered 2013-10-30: 8 [IU] via SUBCUTANEOUS
  Filled 2013-10-30: qty 1

## 2013-10-30 NOTE — ED Provider Notes (Signed)
CSN: [id number]     Arrival date & time 10/30/13  1859 History   First MD Initiated Contact with Patient 10/30/13 2001     Chief Complaint  Patient presents with  . Hyperglycemia   (Consider location/radiation/quality/duration/timing/severity/associated sxs/prior Treatment) HPI Comments: Pt has been diabetic for 3 years, type 2, has been essentially on metformin 500 mg daily for a long time, managed primarily by his cardiologist.  He was referred to an endocrinologist but not till June.  Pt reports he did have too much to eat over New Years and since then has felt poorly and sugars running high, has felt weak, lightheaded, dehydrated with icnreased thirst and polyuria.  No CP, SOB, N/V/D.    Patient is a 68 y.o. male presenting with hyperglycemia. The history is provided by the patient and the spouse.  Hyperglycemia Associated symptoms: fatigue, increased thirst and polyuria   Associated symptoms: no abdominal pain, no chest pain, no fever, no nausea, no shortness of breath and no vomiting     Past Medical History  Diagnosis Date  . 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 HYPERCHOLESTEROLEMIA 12/17/2010  . HYPERTENSION 12/17/2010  . CAD 12/17/2010  . BENIGN PROSTATIC HYPERTROPHY, WITH OBSTRUCTION 12/17/2010  . Prostate cancer     s/p radiation   . Anginal pain   . Myocardial infarction 1996; 2006  . Type II diabetes mellitus 12/17/2010  . Exertional dyspnea   . Shortness of breath     "w/chest pain; sometimes when lying down" (09/03/2012)  . Bleeding per rectum     "related to prostate cancer and hemorrhoids" (09/03/2012)   Past Surgical History  Procedure Laterality Date  . Cardiac catheterization  01/27/2009    ef 60%  . Coronary artery bypass graft  1996    LIMA GRAFT TO THE LAD, SAPHENOUS VEIN GRAFT SEQUENTIALLY TO THE FIRST AND SECOND OBTUSE MARGINAL VESSELS, SAPHENOUS VEIN GRAFT TO THE DIAGONAL, AND SAPHENOUS VEIN GRAFT TO THE DISTAL RIGHT CORONARY   . Laceration repair  1980's   LEFT HAND  . Foot surgery  1980's    LEFT, "shot a nail gun thru it" (09/03/2012)  . Cervical disc surgery  ? 1990's    "went in on the side" (09/03/2012)  . Cardiac catheterization  08/25/2012    Severe 3v obstructive CAD, continued graft patency (SVG-D,  SVG-OM1-OM2, SVG-PDA, LIMA-LAD). area of diffuse dz in the distal LCx (up to 90%) unamenable to PCI   Family History  Problem Relation Age of Onset  . Diabetes Other     2 siblings, 1 of whom is deceased  . Hypertension Other   . Cancer Other     Lung Cancer  . Cancer Other     FH of Prostate Cancer 1st degree relative   History  Substance Use Topics  . Smoking status: Former Smoker -- 1.00 packs/day for 37 years    Types: Cigarettes    Quit date: 10/14/2002  . Smokeless tobacco: Never Used  . Alcohol Use: No    Review of Systems  Constitutional: Positive for fatigue. Negative for fever and chills.  Eyes: Positive for visual disturbance. Negative for pain and redness.  Respiratory: Negative for cough and shortness of breath.   Cardiovascular: Negative for chest pain.  Gastrointestinal: Negative for nausea, vomiting and abdominal pain.  Endocrine: Positive for polydipsia, polyphagia and polyuria.  Neurological: Positive for weakness and light-headedness. Negative for syncope.  All other systems reviewed and are negative.    Allergies  Demerol  Home  Medications   Current Outpatient Rx  Name  Route  Sig  Dispense  Refill  . aspirin EC 81 MG tablet   Oral   Take 81 mg by mouth daily.         Marland Kitchen losartan-hydrochlorothiazide (HYZAAR) 100-25 MG per tablet   Oral   Take 1 tablet by mouth daily.   30 tablet   6   . metoprolol (LOPRESSOR) 50 MG tablet   Oral   Take 1 tablet (50 mg total) by mouth 2 (two) times daily.   60 tablet   6   . Multiple Vitamin (MULTIVITAMIN WITH MINERALS) TABS   Oral   Take 1 tablet by mouth daily.         . nitroGLYCERIN (NITROSTAT) 0.4 MG SL tablet   Sublingual   Place 1  tablet (0.4 mg total) under the tongue every 5 (five) minutes x 3 doses as needed for chest pain.   25 tablet   3   . prasugrel (EFFIENT) 10 MG TABS tablet   Oral   Take 1 tablet (10 mg total) by mouth daily.   30 tablet   6   . simvastatin (ZOCOR) 80 MG tablet   Oral   Take 0.5 tablets (40 mg total) by mouth every morning.   30 tablet   6   . traMADol (ULTRAM) 50 MG tablet   Oral   Take 1 tablet (50 mg total) by mouth every 6 (six) hours as needed for pain.   30 tablet   0   . metFORMIN (FORTAMET) 1000 MG (OSM) 24 hr tablet   Oral   Take 1 tablet (1,000 mg total) by mouth daily with breakfast.   30 tablet   0    BP 107/72  Pulse 70  Temp(Src) 97.7 F (36.5 C) (Oral)  Resp 16  Ht 5\' 7"  (1.702 m)  Wt 171 lb 11.2 oz (77.883 kg)  BMI 26.89 kg/m2  SpO2 96% Physical Exam  Nursing note and vitals reviewed. Constitutional: He is oriented to person, place, and time. He appears well-developed and well-nourished. No distress.  HENT:  Head: Normocephalic and atraumatic.  Eyes: Conjunctivae and EOM are normal.  Neck: Normal range of motion. Neck supple.  Cardiovascular: Normal rate, regular rhythm and intact distal pulses.   Pulmonary/Chest: Effort normal. No respiratory distress.  Abdominal: Soft. He exhibits no distension. There is no tenderness.  Musculoskeletal: He exhibits no tenderness.  Neurological: He is alert and oriented to person, place, and time. Coordination normal.  Skin: Skin is warm and dry. No rash noted. He is not diaphoretic.  Psychiatric: He has a normal mood and affect.    ED Course  Procedures (including critical care time) Labs Review Labs Reviewed  CBC WITH DIFFERENTIAL - Abnormal; Notable for the following:    MCHC 36.4 (*)    All other components within normal limits  GLUCOSE, CAPILLARY - Abnormal; Notable for the following:    Glucose-Capillary 352 (*)    All other components within normal limits  GLUCOSE, CAPILLARY - Abnormal; Notable  for the following:    Glucose-Capillary 303 (*)    All other components within normal limits  GLUCOSE, CAPILLARY - Abnormal; Notable for the following:    Glucose-Capillary 231 (*)    All other components within normal limits  POCT I-STAT, CHEM 8 - Abnormal; Notable for the following:    Sodium 132 (*)    Chloride 90 (*)    Creatinine, Ser 1.50 (*)  Glucose, Bld 377 (*)    Hemoglobin 17.3 (*)    All other components within normal limits   Imaging Review No results found.  EKG Interpretation   None      11:50 PM After 2 L of IVF's, Alda insulin, glucose down to 200.  No sig anion gap.  Not in DKA, tolerating PO's, feeling improved symptomatically.  Will increase PO meds to 1000 mg metformin, d/c home and he is encouraged to follow up with PCP in a coulpe of weeks.   MDM   1. Hyperglycemia   2. Mild dehydration      Pt with symptoms of hyperglycemia and appearance of mild dehydration.  Will give IVF's.  Initial istat does not suggest DKA and pt has not had sig N/V.  May benefit from a small dose of Saxon insulin. Pt encouraged to start seeing a PCP also, or could move his appt up with his endocrinologist to consider additional medications or assess need for insulin as part of his meds.  However given he is on a relatively small dose, doubling his current dose may be sufficient.      Saddie Benders. Eloise Picone, MD 10/30/13 2350

## 2013-10-30 NOTE — Discharge Instructions (Signed)

## 2013-10-30 NOTE — ED Notes (Signed)
Patient states that his sugar is high.  Patient states that when he took it at home it was 404.  He states that he is feeling weak and dizzy.  No nausea or vomiting.  Patient denies any abdominal pain.

## 2013-11-10 ENCOUNTER — Ambulatory Visit (INDEPENDENT_AMBULATORY_CARE_PROVIDER_SITE_OTHER): Payer: BC Managed Care – PPO | Admitting: Nurse Practitioner

## 2013-11-10 ENCOUNTER — Ambulatory Visit: Payer: BC Managed Care – PPO | Admitting: Cardiology

## 2013-11-10 ENCOUNTER — Encounter: Payer: Self-pay | Admitting: Nurse Practitioner

## 2013-11-10 ENCOUNTER — Other Ambulatory Visit: Payer: Self-pay | Admitting: Nurse Practitioner

## 2013-11-10 VITALS — BP 120/80 | HR 68 | Ht 67.0 in | Wt 174.0 lb

## 2013-11-10 DIAGNOSIS — I2510 Atherosclerotic heart disease of native coronary artery without angina pectoris: Secondary | ICD-10-CM

## 2013-11-10 NOTE — Patient Instructions (Addendum)
See Dr. Martinique in 6 months  Let us know if you have any change in your symptoms  Stay on your current medicines  Call the [place] office at [phone] if you have any questions, problems or concerns.

## 2013-11-10 NOTE — Progress Notes (Signed)
Shed Nixon Date of Birth: [birth year] Medical Record #[id number]  History of Present Illness: [name] is seen back today for a 6 month check. Seen for Dr. Martinique. He has known CAD with remote CABG in 1996, last cath in November of 2013 showed patent grafts. Other issues include HTN, HLD, past alcohol abuse, BPH, prostate cancer treated with radiation, and type 2 DM.  Last seen back in August - felt to be doing ok.   Comes back today. Here alone. Says he has been doing well. Remains active. Walking regularly. Will have some chest tightness with walking but it improves as he continues to walk - he is not limited. Says he has had this symptom long term and it has not changed in severity/frequency/etc. He continues to work for [employer] and does a lot of heavy lifting - noted some chest tightness with heavy lifting of logs/gallons of dye - resolved when he stopped lifting. He is not short of breath. Feels ok on his medicines. Remains on chronic Effient. More of his issues center around his blood sugar - having more trouble keeping it down. Was in the ER earlier this month with elevated glucose/dehydration.   Current Outpatient Prescriptions  Medication Sig Dispense Refill  . aspirin EC 81 MG tablet Take 81 mg by mouth daily.      Marland Kitchen losartan-hydrochlorothiazide (HYZAAR) 100-25 MG per tablet Take 1 tablet by mouth daily.  30 tablet  6  . metFORMIN (FORTAMET) 1000 MG (OSM) 24 hr tablet Take 1 tablet (1,000 mg total) by mouth daily with breakfast.  30 tablet  0  . metoprolol (LOPRESSOR) 50 MG tablet Take 1 tablet (50 mg total) by mouth 2 (two) times daily.  60 tablet  6  . Multiple Vitamin (MULTIVITAMIN WITH MINERALS) TABS Take 1 tablet by mouth daily.      . nitroGLYCERIN (NITROSTAT) 0.4 MG SL tablet Place 1 tablet (0.4 mg total) under the tongue every 5 (five) minutes x 3 doses as needed for chest pain.  25 tablet  3  . prasugrel (EFFIENT) 10 MG TABS tablet Take 1 tablet (10 mg total) by  mouth daily.  30 tablet  6  . simvastatin (ZOCOR) 80 MG tablet Take 0.5 tablets (40 mg total) by mouth every morning.  30 tablet  6  . tadalafil (CIALIS) 20 MG tablet Take 20 mg by mouth daily as needed for erectile dysfunction.      . traMADol (ULTRAM) 50 MG tablet Take 1 tablet (50 mg total) by mouth every 6 (six) hours as needed for pain.  30 tablet  0   No current facility-administered medications for this visit.    Allergies  Allergen Reactions  . Demerol Other (See Comments)    hallucinations    Past Medical History  Diagnosis Date  . 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 HYPERCHOLESTEROLEMIA 12/17/2010  . HYPERTENSION 12/17/2010  . CAD 12/17/2010  . BENIGN PROSTATIC HYPERTROPHY, WITH OBSTRUCTION 12/17/2010  . Prostate cancer     s/p radiation   . Anginal pain   . Myocardial infarction 1996; 2006  . Type II diabetes mellitus 12/17/2010  . Exertional dyspnea   . Shortness of breath     "w/chest pain; sometimes when lying down" (09/03/2012)  . Bleeding per rectum     "related to prostate cancer and hemorrhoids" (09/03/2012)    Past Surgical History  Procedure Laterality Date  . Cardiac catheterization  01/27/2009    ef 60%  . Coronary artery bypass  graft  1996    LIMA GRAFT TO THE LAD, SAPHENOUS VEIN GRAFT SEQUENTIALLY TO THE FIRST AND SECOND OBTUSE MARGINAL VESSELS, SAPHENOUS VEIN GRAFT TO THE DIAGONAL, AND SAPHENOUS VEIN GRAFT TO THE DISTAL RIGHT CORONARY   . Laceration repair  1980's    LEFT HAND  . Foot surgery  1980's    LEFT, "shot a nail gun thru it" (09/03/2012)  . Cervical disc surgery  ? 1990's    "went in on the side" (09/03/2012)  . Cardiac catheterization  08/25/2012    Severe 3v obstructive CAD, continued graft patency (SVG-D,  SVG-OM1-OM2, SVG-PDA, LIMA-LAD). area of diffuse dz in the distal LCx (up to 90%) unamenable to PCI    History  Smoking status  . Former Smoker -- 1.00 packs/day for 37 years  . Types: Cigarettes  . Quit date: 10/14/2002    Smokeless tobacco  . Never Used    History  Alcohol Use No    Family History  Problem Relation Age of Onset  . Diabetes Other     2 siblings, 1 of whom is deceased  . Hypertension Other   . Cancer Other     Lung Cancer  . Cancer Other     FH of Prostate Cancer 1st degree relative    Review of Systems: The review of systems is per the HPI.  All other systems were reviewed and are negative.  Physical Exam: BP 120/80  Pulse 68  Ht 5\' 7"  (1.702 m)  Wt 174 lb (78.926 kg)  BMI 27.25 kg/m2 Patient is very pleasant and in no acute distress. Skin is warm and dry. Color is normal.  HEENT is unremarkable. Normocephalic/atraumatic. PERRL. Sclera are nonicteric. Neck is supple. No masses. No JVD. Lungs are clear. Cardiac exam shows a regular rate and rhythm. Abdomen is soft. Extremities are without edema. Gait and ROM are intact. No gross neurologic deficits noted.  LABORATORY DATA: EKG today shows sinus with diffuse T wave changes - tracing reviewed with Dr. Martinique.   Lab Results  Component Value Date   WBC 5.5 10/30/2013   HGB 17.3* 10/30/2013   HCT 51.0 10/30/2013   PLT 173 10/30/2013   GLUCOSE 377* 10/30/2013   CHOL 108 09/04/2012   TRIG 114 09/04/2012   HDL 30* 09/04/2012   LDLCALC 55 09/04/2012   ALT 14 02/02/2013   AST 19 02/02/2013   NA 132* 10/30/2013   K 4.0 10/30/2013   CL 90* 10/30/2013   CREATININE 1.50* 10/30/2013   BUN 20 10/30/2013   CO2 26 06/18/2013   TSH 0.94 12/17/2010   INR 0.97 09/03/2012   HGBA1C 8.6* 02/01/2013   MICROALBUR 3.1* 12/17/2010     Assessment / Plan: 1. CAD with prior CABG - last cath in 2013 showed his grafts to be patent - he is happy with how he is doing clinically. Discussed his case with Dr. Martinique - will continue with medical management. He is to let us know if he has a change in symptoms.  2. HTN - BP looks good today. No change in therapy.   3. HLD - lipids from November noted.   See him back in 6 months. Continue with current plan of  care.   Patient is agreeable to this plan and will call if any problems develop in the interim.   Burtis Junes, RN, [facility] [address] [place], [place]  [address] [phone]

## 2013-11-11 ENCOUNTER — Ambulatory Visit (INDEPENDENT_AMBULATORY_CARE_PROVIDER_SITE_OTHER): Payer: BC Managed Care – PPO | Admitting: Physician Assistant

## 2013-11-11 ENCOUNTER — Other Ambulatory Visit: Payer: Self-pay

## 2013-11-11 ENCOUNTER — Encounter: Payer: Self-pay | Admitting: Physician Assistant

## 2013-11-11 VITALS — BP 140/80 | HR 76 | Temp 98.0°F | Resp 20 | Ht 67.0 in | Wt 172.8 lb

## 2013-11-11 DIAGNOSIS — I2510 Atherosclerotic heart disease of native coronary artery without angina pectoris: Secondary | ICD-10-CM

## 2013-11-11 DIAGNOSIS — E119 Type 2 diabetes mellitus without complications: Secondary | ICD-10-CM

## 2013-11-11 DIAGNOSIS — I10 Essential (primary) hypertension: Secondary | ICD-10-CM

## 2013-11-11 DIAGNOSIS — E785 Hyperlipidemia, unspecified: Secondary | ICD-10-CM

## 2013-11-11 MED ORDER — INSULIN GLARGINE 100 UNIT/ML SOLOSTAR PEN: 30.0000 [IU] | PEN_INJECTOR | Freq: Every day | SUBCUTANEOUS | Status: DC

## 2013-11-11 MED ORDER — INSULIN PEN NEEDLE 32G X 4 MM MISC: 1.0000 "pen " | Freq: Every day | Status: AC

## 2013-11-11 NOTE — Progress Notes (Signed)
   Subjective:    Patient ID: Antonio [name], male    DOB: [birth year], 68 y.o.   MRN: [id number]  HPI    Review of Systems     Objective:   Physical Exam   Lab Results  Component Value Date   WBC 5.5 10/30/2013   HGB 17.3* 10/30/2013   HCT 51.0 10/30/2013   PLT 173 10/30/2013   GLUCOSE 377* 10/30/2013   CHOL 108 09/04/2012   TRIG 114 09/04/2012   HDL 30* 09/04/2012   LDLCALC 55 09/04/2012   ALT 14 02/02/2013   AST 19 02/02/2013   NA 132* 10/30/2013   K 4.0 10/30/2013   CL 90* 10/30/2013   CREATININE 1.50* 10/30/2013   BUN 20 10/30/2013   CO2 26 06/18/2013   TSH 0.94 12/17/2010   INR 0.97 09/03/2012   HGBA1C 8.6* 02/01/2013   MICROALBUR 3.1* 12/17/2010       Assessment & Plan:

## 2013-11-11 NOTE — Progress Notes (Signed)
Subjective:    Patient ID: Antonio [name], male    DOB: Nov 17, 1945, 68 y.o.   MRN: [id number]  HPI Comments: Patient is a 68 year old male presents to the clinic today to establish care. Patient reports one week prior to arrival he presented to the emergency department with complaints of nausea fatigue dizziness and blurred vision. States while in the ED he was informed that his blood glucose levels were elevated. Reports while in the ED he was provided an injection of insulin  And instructed to take 1000 mg of metformin daily. States her numbers stay elevated he takes an additional dose of metformin. States after discharge he has had a difficult time with his glucose levels, having readings at home ranging anywhere from 172 just over 300. Reports symptoms of nausea, dizziness and blurry vision have improved since discharge however is concerned for glucose levels. Denies nausea, vomiting, fever/chills, visual disturbance, lightheadedness, numbness or weakness. History significant for hypertension, CAD, hypercholesterolemia and diabetes. Patient follows with cardiology, Dr. Peter Martinique, lasts seen one day prior to arrival, scheduled for followup in 6 months.   Review of Systems  Constitutional: Negative for fever, chills, activity change and appetite change.  Eyes: Positive for visual disturbance (blurry vision, one week prior to arrival, resolved). Negative for pain.  Respiratory: Negative for cough, shortness of breath and wheezing.   Cardiovascular: Negative for chest pain and palpitations.  Gastrointestinal: Positive for nausea (1 week PTA, resolved). Negative for vomiting.  Neurological: Positive for dizziness (one week PTA, resolved). Negative for weakness, numbness and headaches.   Past Medical History  Diagnosis Date  . 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 HYPERCHOLESTEROLEMIA 12/17/2010  . HYPERTENSION 12/17/2010  . CAD 12/17/2010  . BENIGN PROSTATIC HYPERTROPHY, WITH OBSTRUCTION  12/17/2010  . Prostate cancer     s/p radiation   . Anginal pain   . Myocardial infarction 1996; 2006  . Type II diabetes mellitus 12/17/2010  . Exertional dyspnea   . Shortness of breath     "w/chest pain; sometimes when lying down" (09/03/2012)  . Bleeding per rectum     "related to prostate cancer and hemorrhoids" (09/03/2012)  . GERD (gastroesophageal reflux disease)   . Urine incontinence   . Chicken pox    Family History  Problem Relation Age of Onset  . Diabetes Other     2 siblings, 1 of whom is deceased  . Hypertension Other   . Cancer Other     Lung Cancer  . Cancer Other     FH of Prostate Cancer 1st degree relative  . Cancer Mother     breast  . Hypertension Mother   . Hypertension Father    .sochx Past Surgical History  Procedure Laterality Date  . Cardiac catheterization  01/27/2009    ef 60%  . Coronary artery bypass graft  1996    LIMA GRAFT TO THE LAD, SAPHENOUS VEIN GRAFT SEQUENTIALLY TO THE FIRST AND SECOND OBTUSE MARGINAL VESSELS, SAPHENOUS VEIN GRAFT TO THE DIAGONAL, AND SAPHENOUS VEIN GRAFT TO THE DISTAL RIGHT CORONARY   . Laceration repair  1980's    LEFT HAND  . Foot surgery  1980's    LEFT, "shot a nail gun thru it" (09/03/2012)  . Cervical disc surgery  ? 1990's    "went in on the side" (09/03/2012)  . Cardiac catheterization  08/25/2012    Severe 3v obstructive CAD, continued graft patency (SVG-D,  SVG-OM1-OM2, SVG-PDA, LIMA-LAD). area of diffuse dz in the distal LCx (up  to 90%) unamenable to PCI      Objective:   Physical Exam  Vitals reviewed. Constitutional: He is oriented to person, place, and time. He appears well-developed and well-nourished. No distress.  HENT:  Head: Normocephalic and atraumatic.  Right Ear: External ear normal.  Left Ear: External ear normal.  Nose: Nose normal.  Eyes: Conjunctivae are normal. No scleral icterus.  Neck: Normal range of motion.  Cardiovascular: Normal rate, regular rhythm and intact distal pulses.   Exam reveals no gallop and no friction rub.   No murmur heard. Pulses:      Radial pulses are 2+ on the right side, and 2+ on the left side.  Pulmonary/Chest: Effort normal and breath sounds normal. He has no wheezes. He has no rhonchi. He has no rales.  Neurological: He is alert and oriented to person, place, and time.  Skin: Skin is warm and dry. He is not diaphoretic.  Psychiatric: He has a normal mood and affect.    Lab Results  Component Value Date   WBC 5.5 10/30/2013   HGB 17.3* 10/30/2013   HCT 51.0 10/30/2013   PLT 173 10/30/2013   GLUCOSE 377* 10/30/2013   CHOL 108 09/04/2012   TRIG 114 09/04/2012   HDL 30* 09/04/2012   LDLCALC 55 09/04/2012   ALT 14 02/02/2013   AST 19 02/02/2013   NA 132* 10/30/2013   K 4.0 10/30/2013   CL 90* 10/30/2013   CREATININE 1.50* 10/30/2013   BUN 20 10/30/2013   CO2 26 06/18/2013   TSH 0.94 12/17/2010   INR 0.97 09/03/2012   HGBA1C 8.6* 02/01/2013   MICROALBUR 3.1* 12/17/2010       Assessment & Plan:   DM:  I have sent prescriptions to your pharmacy for Lantus 100 units/ml, 30 units daily. First dose administered and demonstrated to patient today.  Discontinue use of Metformin. Start Janumet XR 100 mg/1000 mg and Lantus 30 units once every 24 hours.   Patient counseled to monitor glucose three times daily, goal is to be under 200.  Please keep written record of numbers.   Return to office within 25-28 days for evaluation.  Referral to diabetic education.   HTN/Hypercholesterolemia Continue medications as prescribed.   Patient stated understanding and agreement with treatment plan

## 2013-11-11 NOTE — Patient Instructions (Signed)
It was great meeting you today [name]!  Labs have been ordered for you, when you report to lab please be fasting.   I have sent prescriptions to your pharmacy.   Discontinue use of Metformin  Start Janumet XR 100 mg/1000 mg and Lantus 30 units once every 24 hours.   Monitor glucose three times daily, goal is to be under 200.  Please keep written record of numbers.   Return to office with 25-28 days for evaluation.  Referral to diabetic education.   Diabetes and Exercise Exercising regularly is important. It is not just about losing weight. It has many health benefits, such as:  Improving your overall fitness, flexibility, and endurance.  Increasing your bone density.  Helping with weight control.  Decreasing your body fat.  Increasing your muscle strength.  Reducing stress and tension.  Improving your overall health. People with diabetes who exercise gain additional benefits because exercise:  Reduces appetite.  Improves the body's use of blood sugar (glucose).  Helps lower or control blood glucose.  Decreases blood pressure.  Helps control blood lipids (such as cholesterol and triglycerides).  Improves the body's use of the hormone insulin by:  Increasing the body's insulin sensitivity.  Reducing the body's insulin needs.  Decreases the risk for heart disease because exercising:  Lowers cholesterol and triglycerides levels.  Increases the levels of good cholesterol (such as high-density lipoproteins [HDL]) in the body.  Lowers blood glucose levels. YOUR ACTIVITY PLAN  Choose an activity that you enjoy and set realistic goals. Your health care provider or diabetes educator can help you make an activity plan that works for you. You can break activities into 2 or 3 sessions throughout the day. Doing so is as good as one long session. Exercise ideas include:  Taking the dog for a walk.  Taking the stairs instead of the elevator.  Dancing to your  favorite song.  Doing your favorite exercise with a friend. RECOMMENDATIONS FOR EXERCISING WITH TYPE 1 OR TYPE 2 DIABETES   Check your blood glucose before exercising. If blood glucose levels are greater than 240 mg/dL, check for urine ketones. Do not exercise if ketones are present.  Avoid injecting insulin into areas of the body that are going to be exercised. For example, avoid injecting insulin into:  The arms when playing tennis.  The legs when jogging.  Keep a record of:  Food intake before and after you exercise.  Expected peak times of insulin action.  Blood glucose levels before and after you exercise.  The type and amount of exercise you have done.  Review your records with your health care provider. Your health care provider will help you to develop guidelines for adjusting food intake and insulin amounts before and after exercising.  If you take insulin or oral hypoglycemic agents, watch for signs and symptoms of hypoglycemia. They include:  Dizziness.  Shaking.  Sweating.  Chills.  Confusion.  Drink plenty of water while you exercise to prevent dehydration or heat stroke. Body water is lost during exercise and must be replaced.  Talk to your health care provider before starting an exercise program to make sure it is safe for you. Remember, almost any type of activity is better than none. Document Released: 12/21/2003 Document Revised: 06/02/2013 Document Reviewed: 03/09/2013 [facility] Patient Information 2014 [place].

## 2013-11-11 NOTE — Progress Notes (Signed)
Pre-visit discussion using our clinic review tool. No additional management support is needed unless otherwise documented below in the visit note.  

## 2013-11-12 ENCOUNTER — Telehealth: Payer: Self-pay | Admitting: Physician Assistant

## 2013-11-12 NOTE — Telephone Encounter (Signed)
Pt called stated that his blood sugar is 86 this morning, pt was wondering if he need to take insulin? Please call pt.

## 2013-11-12 NOTE — Telephone Encounter (Signed)
Call to patient. Discussed his Diabetic medications. He states that his blood sugar this morning was 69 and when he rechecked, his BS was 86. He is concerned with the low number. As directed by Wynetta Emery, PA, Mr. Zambito can decrease his Lantus insulin to 20u in the morning and continue his oral medication (Janumet XR 100/1000mg ). Confirmed that he has stopped the Metformin. He states that he has started his Blood sugar log.Mr . Jeschke verbalized understanding re: his medications and that when he takes his insulin that he needs to eat a light meal, also reminded him that he should be eating small frequent meals as his work and sleep schedules allows. He will call if he has questions or any problems.

## 2013-11-17 ENCOUNTER — Emergency Department (HOSPITAL_COMMUNITY): Payer: BC Managed Care – PPO

## 2013-11-17 ENCOUNTER — Inpatient Hospital Stay (HOSPITAL_COMMUNITY)
Admission: EM | Admit: 2013-11-17 | Discharge: 2013-11-22 | DRG: 287 | Disposition: A | Discharge status: Home / Self Care | Payer: BC Managed Care – PPO | Attending: Internal Medicine | Admitting: Internal Medicine

## 2013-11-17 ENCOUNTER — Encounter (HOSPITAL_COMMUNITY): Payer: Self-pay | Admitting: Emergency Medicine

## 2013-11-17 DIAGNOSIS — E1159 Type 2 diabetes mellitus with other circulatory complications: Secondary | ICD-10-CM | POA: Diagnosis present

## 2013-11-17 DIAGNOSIS — Z87891 Personal history of nicotine dependence: Secondary | ICD-10-CM

## 2013-11-17 DIAGNOSIS — R0789 Other chest pain: Secondary | ICD-10-CM

## 2013-11-17 DIAGNOSIS — D696 Thrombocytopenia, unspecified: Secondary | ICD-10-CM | POA: Diagnosis present

## 2013-11-17 DIAGNOSIS — I252 Old myocardial infarction: Secondary | ICD-10-CM

## 2013-11-17 DIAGNOSIS — I209 Angina pectoris, unspecified: Secondary | ICD-10-CM

## 2013-11-17 DIAGNOSIS — Z79899 Other long term (current) drug therapy: Secondary | ICD-10-CM

## 2013-11-17 DIAGNOSIS — I2510 Atherosclerotic heart disease of native coronary artery without angina pectoris: Secondary | ICD-10-CM

## 2013-11-17 DIAGNOSIS — R079 Chest pain, unspecified: Secondary | ICD-10-CM

## 2013-11-17 DIAGNOSIS — Z923 Personal history of irradiation: Secondary | ICD-10-CM

## 2013-11-17 DIAGNOSIS — I10 Essential (primary) hypertension: Secondary | ICD-10-CM | POA: Diagnosis present

## 2013-11-17 DIAGNOSIS — K219 Gastro-esophageal reflux disease without esophagitis: Secondary | ICD-10-CM | POA: Diagnosis present

## 2013-11-17 DIAGNOSIS — K921 Melena: Secondary | ICD-10-CM | POA: Diagnosis present

## 2013-11-17 DIAGNOSIS — K625 Hemorrhage of anus and rectum: Secondary | ICD-10-CM

## 2013-11-17 DIAGNOSIS — Z7982 Long term (current) use of aspirin: Secondary | ICD-10-CM

## 2013-11-17 DIAGNOSIS — D500 Iron deficiency anemia secondary to blood loss (chronic): Secondary | ICD-10-CM | POA: Diagnosis present

## 2013-11-17 DIAGNOSIS — E7800 Pure hypercholesterolemia, unspecified: Secondary | ICD-10-CM | POA: Diagnosis present

## 2013-11-17 DIAGNOSIS — Z794 Long term (current) use of insulin: Secondary | ICD-10-CM

## 2013-11-17 DIAGNOSIS — I200 Unstable angina: Principal | ICD-10-CM | POA: Diagnosis present

## 2013-11-17 DIAGNOSIS — E119 Type 2 diabetes mellitus without complications: Secondary | ICD-10-CM | POA: Diagnosis present

## 2013-11-17 DIAGNOSIS — D72819 Decreased white blood cell count, unspecified: Secondary | ICD-10-CM | POA: Diagnosis present

## 2013-11-17 DIAGNOSIS — I472 Ventricular tachycardia, unspecified: Secondary | ICD-10-CM | POA: Diagnosis present

## 2013-11-17 DIAGNOSIS — Z8546 Personal history of malignant neoplasm of prostate: Secondary | ICD-10-CM

## 2013-11-17 DIAGNOSIS — I4729 Other ventricular tachycardia: Secondary | ICD-10-CM | POA: Diagnosis present

## 2013-11-17 DIAGNOSIS — I25810 Atherosclerosis of coronary artery bypass graft(s) without angina pectoris: Secondary | ICD-10-CM | POA: Diagnosis present

## 2013-11-17 DIAGNOSIS — K922 Gastrointestinal hemorrhage, unspecified: Secondary | ICD-10-CM

## 2013-11-17 LAB — POCT I-STAT TROPONIN I: Troponin i, poc: 0 ng/mL (ref 0.00–0.08)

## 2013-11-17 LAB — CBC
HCT: 35.8 % — ABNORMAL LOW (ref 39.0–52.0)
HEMATOCRIT: 37.7 % — AB (ref 39.0–52.0)
HEMOGLOBIN: 12.9 g/dL — AB (ref 13.0–17.0)
Hemoglobin: 12.1 g/dL — ABNORMAL LOW (ref 13.0–17.0)
MCH: 30.1 pg (ref 26.0–34.0)
MCH: 29.6 pg (ref 26.0–34.0)
MCHC: 34.2 g/dL (ref 30.0–36.0)
MCHC: 33.8 g/dL (ref 30.0–36.0)
MCV: 87.9 fL (ref 78.0–100.0)
MCV: 87.5 fL (ref 78.0–100.0)
Platelets: 138 10*3/uL — ABNORMAL LOW (ref 150–400)
Platelets: 120 10*3/uL — ABNORMAL LOW (ref 150–400)
RBC: 4.29 MIL/uL (ref 4.22–5.81)
RBC: 4.09 MIL/uL — AB (ref 4.22–5.81)
RDW: 15.1 % (ref 11.5–15.5)
RDW: 15 % (ref 11.5–15.5)
WBC: 5.1 10*3/uL (ref 4.0–10.5)
WBC: 5 10*3/uL (ref 4.0–10.5)

## 2013-11-17 LAB — BASIC METABOLIC PANEL
BUN: 18 mg/dL (ref 6–23)
CALCIUM: 9 mg/dL (ref 8.4–10.5)
CO2: 27 meq/L (ref 19–32)
Chloride: 106 mEq/L (ref 96–112)
Creatinine, Ser: 1.29 mg/dL (ref 0.50–1.35)
GFR calc Af Amer: 66 mL/min — ABNORMAL LOW (ref >90)
GFR calc non Af Amer: 57 mL/min — ABNORMAL LOW (ref >90)
GLUCOSE: 186 mg/dL — AB (ref 70–99)
Potassium: 3.8 mEq/L (ref 3.7–5.3)
Sodium: 144 mEq/L (ref 137–147)

## 2013-11-17 LAB — OCCULT BLOOD, POC DEVICE: Fecal Occult Bld: POSITIVE — AB

## 2013-11-17 LAB — GLUCOSE, CAPILLARY
Glucose-Capillary: 54 mg/dL — ABNORMAL LOW (ref 70–99)
Glucose-Capillary: 78 mg/dL (ref 70–99)
Glucose-Capillary: 143 mg/dL — ABNORMAL HIGH (ref 70–99)

## 2013-11-17 LAB — TROPONIN I: Troponin I: <0.3 ng/mL (ref <0.30)

## 2013-11-17 LAB — PRO B NATRIURETIC PEPTIDE: Pro B Natriuretic peptide (BNP): 219.5 pg/mL — ABNORMAL HIGH (ref 0–125)

## 2013-11-17 MED ORDER — INSULIN GLARGINE 100 UNIT/ML ~~LOC~~ SOLN
10.0000 [IU] | Freq: Every day | SUBCUTANEOUS | Status: DC
Start: 2013-11-17 — End: 2013-11-18
  Filled 2013-11-17 (×2): qty 0.1

## 2013-11-17 MED ORDER — ACETAMINOPHEN 325 MG PO TABS
650.0000 mg | ORAL_TABLET | ORAL | Status: DC | PRN
  Administered 2013-11-17 – 2013-11-20 (×8): 650 mg via ORAL
  Filled 2013-11-17 (×7): qty 2

## 2013-11-17 MED ORDER — INSULIN ASPART 100 UNIT/ML ~~LOC~~ SOLN
0.0000 [IU] | SUBCUTANEOUS | Status: DC
  Administered 2013-11-18: 1 [IU] via SUBCUTANEOUS
  Administered 2013-11-19: 3 [IU] via SUBCUTANEOUS
  Administered 2013-11-20 (×2): 1 [IU] via SUBCUTANEOUS
  Administered 2013-11-20: 3 [IU] via SUBCUTANEOUS
  Administered 2013-11-21: 1 [IU] via SUBCUTANEOUS
  Administered 2013-11-21: 3 [IU] via SUBCUTANEOUS
  Administered 2013-11-21 (×2): 2 [IU] via SUBCUTANEOUS

## 2013-11-17 MED ORDER — SODIUM CHLORIDE 0.9 % IJ SOLN: 3.0000 mL | INTRAMUSCULAR | Status: DC | PRN

## 2013-11-17 MED ORDER — ASPIRIN EC 81 MG PO TBEC
81.0000 mg | DELAYED_RELEASE_TABLET | Freq: Every day | ORAL | Status: DC
  Administered 2013-11-18 – 2013-11-21 (×3): 81 mg via ORAL
  Filled 2013-11-17 (×4): qty 1

## 2013-11-17 MED ORDER — SODIUM CHLORIDE 0.9 % IJ SOLN
3.0000 mL | Freq: Two times a day (BID) | INTRAMUSCULAR | Status: DC
Start: 2013-11-17 — End: 2013-11-22
  Administered 2013-11-17 – 2013-11-21 (×7): 3 mL via INTRAVENOUS

## 2013-11-17 MED ORDER — NITROGLYCERIN IN D5W 200-5 MCG/ML-% IV SOLN
5.0000 ug/min | Freq: Once | INTRAVENOUS | Status: AC
  Administered 2013-11-17: 5 ug/min via INTRAVENOUS
  Filled 2013-11-17: qty 250

## 2013-11-17 MED ORDER — ATORVASTATIN CALCIUM 40 MG PO TABS
40.0000 mg | ORAL_TABLET | Freq: Every day | ORAL | Status: DC
  Administered 2013-11-18 – 2013-11-21 (×4): 40 mg via ORAL
  Filled 2013-11-17 (×5): qty 1

## 2013-11-17 MED ORDER — ONDANSETRON HCL 4 MG/2ML IJ SOLN: 4.0000 mg | Freq: Four times a day (QID) | INTRAMUSCULAR | Status: DC | PRN

## 2013-11-17 MED ORDER — METOPROLOL TARTRATE 50 MG PO TABS
50.0000 mg | ORAL_TABLET | Freq: Two times a day (BID) | ORAL | Status: DC
  Administered 2013-11-17 – 2013-11-22 (×9): 50 mg via ORAL
  Filled 2013-11-17 (×11): qty 1

## 2013-11-17 MED ORDER — NITROGLYCERIN IN D5W 200-5 MCG/ML-% IV SOLN: 3.0000 ug/min | INTRAVENOUS | Status: DC

## 2013-11-17 MED ORDER — SODIUM CHLORIDE 0.9 % IV SOLN
250.0000 mL | INTRAVENOUS | Status: DC | PRN
  Administered 2013-11-19: 10:00:00 via INTRAVENOUS

## 2013-11-17 MED ORDER — PANTOPRAZOLE SODIUM 40 MG PO TBEC
40.0000 mg | DELAYED_RELEASE_TABLET | Freq: Every day | ORAL | Status: DC
  Administered 2013-11-17: 40 mg via ORAL
  Filled 2013-11-17: qty 1

## 2013-11-17 MED ORDER — TRAMADOL HCL 50 MG PO TABS
50.0000 mg | ORAL_TABLET | Freq: Four times a day (QID) | ORAL | Status: DC | PRN
  Administered 2013-11-20: 50 mg via ORAL
  Filled 2013-11-17: qty 1

## 2013-11-17 MED ORDER — INSULIN GLARGINE 100 UNIT/ML SOLOSTAR PEN: 10.0000 [IU] | PEN_INJECTOR | Freq: Every morning | SUBCUTANEOUS | Status: DC
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
## 2013-11-17 NOTE — ED Notes (Signed)
Admitting at bedside 

## 2013-11-17 NOTE — ED Notes (Signed)
This RN spoke with the mini lab tech, who reported that the pt's hemoccult was positive. The tech will work on having the results put in the computer.

## 2013-11-17 NOTE — ED Notes (Signed)
Pt in c/o chest pain since last night, states pain improved with nitro SL a few hours ago and then returned, denies shortness of breath, admits to nausea but no vomiting, no distress noted at this time

## 2013-11-17 NOTE — Consult Note (Signed)
Pt. Seen and examined. Agree with the NP/PA-C note as written.  68 yo patient of Dr. 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  He has been having rectal bleeding on and off for several months, but more recently. He is on aspirin and effient, which he has been on at least since discharge after cath in 2013.  Labs show new anemia, however, it is relative to a higher than normal hematocrit. Chest pain improved somewhat with nitrates. Initial troponin is negative.  Impression: 1.  Chest pain, possibly unstable angina 2.  Ongoing rectal bleeding with guaiac positive stools  Recommend: 1.  Chest pain - agree with nitroglycerin. Do not heparinize unless chest pain worsens or new dynamic EKG changes. Would hold effient for now to see if we can get bleeding to slow - also, he may need colonoscopy or anoscopy.  Please keep NPO after midnight. 2.  Rectal bleed - agree with GI consult, need to try to find treatable source of bleeding before possibly committing him to cath or percutaneous intervention and more potent anticoagulation.  Thanks for consulting [place]. We will follow closely with you.  Pixie Casino, MD, [facility] Attending Cardiologist [place]

## 2013-11-17 NOTE — Consult Note (Signed)
ANTICOAGULATION CONSULT NOTE - Initial Consult  Pharmacy Consult for Heparin Indication: chest pain/ACS  Allergies  Allergen Reactions  . Demerol Other (See Comments)    hallucinations  . Meperidine And Related     Patient Measurements: Height: 5' 6.93" (170 cm) Weight: 172 lb 13.5 oz (78.4 kg) IBW/kg (Calculated) : 65.94 Heparin Dosing Weight: 78kg  Vital Signs: Temp: 98.6 F (37 C) (02/04 1606) Temp src: Oral (02/04 1606) BP: 114/80 mmHg (02/04 1606) Pulse Rate: 71 (02/04 1606)  Labs:  Recent Labs  11/17/13 1608  HGB 12.9*  HCT 37.7*  PLT 138*   Estimated Creatinine Clearance: 45.8 ml/min (by C-G formula based on Cr of 1.5).  Medical History: Past Medical History  Diagnosis Date  . 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 HYPERCHOLESTEROLEMIA 12/17/2010  . HYPERTENSION 12/17/2010  . CAD 12/17/2010  . BENIGN PROSTATIC HYPERTROPHY, WITH OBSTRUCTION 12/17/2010  . Prostate cancer     s/p radiation   . Anginal pain   . Myocardial infarction 1996; 2006  . Type II diabetes mellitus 12/17/2010  . Exertional dyspnea   . Shortness of breath     "w/chest pain; sometimes when lying down" (09/03/2012)  . Bleeding per rectum     "related to prostate cancer and hemorrhoids" (09/03/2012)  . GERD (gastroesophageal reflux disease)   . Urine incontinence   . Chicken pox     Medications:  No anticoagulants pta  Assessment: 67yom with known CAD presents to the ED with chest pain since last night. He will begin IV heparin. He is slightly anemic compared to 2 weeks ago with Hgb 12.9 and platelets 138.   Goal of Therapy:  Heparin level 0.3-0.7 units/ml Monitor platelets by anticoagulation protocol: Yes   Plan:  1) Heparin bolus 4000 units x 1 2) Heparin drip at 1050 units/hr  Deboraha Sprang 11/17/2013,4:29 PM

## 2013-11-17 NOTE — ED Provider Notes (Signed)
CSN: [id number]     Arrival date & time 11/17/13  1558 History   First MD Initiated Contact with Patient 11/17/13 1604     Chief Complaint  Patient presents with  . Chest Pain   (Consider location/radiation/quality/duration/timing/severity/associated sxs/prior Treatment) HPI Comments: Antonio [name] is a 68 y.o. male with a history of CAD (s/p CABG x4), DM, prostate Ca, HTN who presents with chest pain since 2000 yesterday.  The patient reports constant, non-radiating, substernal pressure.  He reports 9/10 at it's worse, discomfort in ED currently 4/10.  He reports associated nausea without emesis.  He reports partial resolution of symptoms with home nitro x3, last nitro 20 min before arrival. Denies abdominal pain. He reports dark stools for one week.  Currently on effient and is followed by Dr. Peter Martinique, Cardiologist.  Patient is a 68 y.o. male presenting with chest pain. The history is provided by medical records and the patient. No language interpreter was used.  Chest Pain Associated symptoms: nausea   Associated symptoms: no abdominal pain, no cough, no fever, no palpitations and not vomiting     Past Medical History  Diagnosis Date  . 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 HYPERCHOLESTEROLEMIA 12/17/2010  . HYPERTENSION 12/17/2010  . CAD 12/17/2010  . BENIGN PROSTATIC HYPERTROPHY, WITH OBSTRUCTION 12/17/2010  . Prostate cancer     s/p radiation   . Anginal pain   . Myocardial infarction 1996; 2006  . Type II diabetes mellitus 12/17/2010  . Exertional dyspnea   . Shortness of breath     "w/chest pain; sometimes when lying down" (09/03/2012)  . Bleeding per rectum     "related to prostate cancer and hemorrhoids" (09/03/2012)  . GERD (gastroesophageal reflux disease)   . Urine incontinence   . Chicken pox    Past Surgical History  Procedure Laterality Date  . Cardiac catheterization  01/27/2009    ef 60%  . Coronary artery bypass graft  1996    LIMA GRAFT TO THE LAD, SAPHENOUS  VEIN GRAFT SEQUENTIALLY TO THE FIRST AND SECOND OBTUSE MARGINAL VESSELS, SAPHENOUS VEIN GRAFT TO THE DIAGONAL, AND SAPHENOUS VEIN GRAFT TO THE DISTAL RIGHT CORONARY   . Laceration repair  1980's    LEFT HAND  . Foot surgery  1980's    LEFT, "shot a nail gun thru it" (09/03/2012)  . Cervical disc surgery  ? 1990's    "went in on the side" (09/03/2012)  . Cardiac catheterization  08/25/2012    Severe 3v obstructive CAD, continued graft patency (SVG-D,  SVG-OM1-OM2, SVG-PDA, LIMA-LAD). area of diffuse dz in the distal LCx (up to 90%) unamenable to PCI   Family History  Problem Relation Age of Onset  . Diabetes Other     2 siblings, 1 of whom is deceased  . Hypertension Other   . Cancer Other     Lung Cancer  . Cancer Other     FH of Prostate Cancer 1st degree relative  . Cancer Mother     breast  . Hypertension Mother   . Hypertension Father    History  Substance Use Topics  . Smoking status: Former Smoker -- 1.00 packs/day for 37 years    Types: Cigarettes    Quit date: 10/14/2002  . Smokeless tobacco: Never Used  . Alcohol Use: No    Review of Systems  Constitutional: Negative for fever and chills.  Respiratory: Negative for cough.   Cardiovascular: Positive for chest pain. Negative for palpitations and leg swelling.  Gastrointestinal:  Positive for nausea. Negative for vomiting, abdominal pain, diarrhea and anal bleeding.    Allergies  Demerol and Meperidine and related  Home Medications   Current Outpatient Rx  Name  Route  Sig  Dispense  Refill  . aspirin EC 81 MG tablet   Oral   Take 81 mg by mouth daily.         . Insulin Glargine (LANTUS SOLOSTAR) 100 UNIT/ML Solostar Pen   Subcutaneous   Inject 20 Units into the skin every morning.         . Insulin Pen Needle (BD PEN NEEDLE NANO U/F) 32G X 4 MM MISC   Does not apply   1 pen by Does not apply route daily.   30 each   11   . losartan-hydrochlorothiazide (HYZAAR) 100-25 MG per tablet   Oral   Take  1 tablet by mouth daily.         . metoprolol (LOPRESSOR) 50 MG tablet   Oral   Take 1 tablet (50 mg total) by mouth 2 (two) times daily.   60 tablet   6   . Multiple Vitamin (MULTIVITAMIN WITH MINERALS) TABS   Oral   Take 1 tablet by mouth daily.         . nitroGLYCERIN (NITROSTAT) 0.4 MG SL tablet   Sublingual   Place 1 tablet (0.4 mg total) under the tongue every 5 (five) minutes x 3 doses as needed for chest pain.   25 tablet   3   . prasugrel (EFFIENT) 10 MG TABS tablet   Oral   Take 1 tablet (10 mg total) by mouth daily.   30 tablet   6   . simvastatin (ZOCOR) 80 MG tablet   Oral   Take 0.5 tablets (40 mg total) by mouth every morning.   30 tablet   6   . tadalafil (CIALIS) 20 MG tablet   Oral   Take 20 mg by mouth daily as needed for erectile dysfunction.         . traMADol (ULTRAM) 50 MG tablet   Oral   Take 1 tablet (50 mg total) by mouth every 6 (six) hours as needed for pain.   30 tablet   0    BP 114/80  Pulse 71  Temp(Src) 98.6 F (37 C) (Oral)  Resp 12  Ht 5' 6.93" (1.7 m)  Wt 172 lb 13.5 oz (78.4 kg)  BMI 27.13 kg/m2  SpO2 97% Physical Exam  Constitutional: He is oriented to person, place, and time. He appears well-developed and well-nourished. No distress.  HENT:  Head: Normocephalic and atraumatic.  Neck: Neck supple.  Cardiovascular: Normal rate and regular rhythm.   No lower extremity edema  Pulmonary/Chest: Breath sounds normal. No respiratory distress. He has no wheezes. He has no rales.  Abdominal: Soft. There is no rebound and no guarding.  Genitourinary: Rectal exam shows internal hemorrhoid.  No gross blood on DRE.  Chaperone present.  Neurological: He is alert and oriented to person, place, and time.  Skin: Skin is warm and dry. He is not diaphoretic.    ED Course  Procedures (including critical care time) Labs Review Labs Reviewed  CBC - Abnormal; Notable for the following:    Hemoglobin 12.9 (*)    HCT 37.7 (*)     Platelets 138 (*)    All other components within normal limits  BASIC METABOLIC PANEL  PRO B NATRIURETIC PEPTIDE  OCCULT BLOOD X 1 CARD TO LAB,  STOOL  POCT I-STAT TROPONIN I   Imaging Review No results found.  EKG Interpretation   None      Date: 11/17/2013  Rate: 71  Rhythm: normal sinus rhythm  QRS Axis: normal  Intervals: normal  ST/T Wave abnormalities: T wave abnormality, consider lateral ischemia  Conduction Disutrbances:none  Narrative Interpretation:   Old EKG Reviewed:       MDM   1. GI bleed   2. Chest pain   3. BRBPR (bright red blood per rectum)   4. CAD (coronary artery disease) of artery bypass graft   5. Type II or unspecified type diabetes mellitus without mention of complication, not stated as uncontrolled   6. Coronary atherosclerosis of unspecified type of vessel, native or graft   7. Unstable angina   8. Angina pectoris   9. Midsternal chest pain   10. Pure hypercholesterolemia    Pt with a history of MI x2 and CABG presents with substernal tightness since last night, partially relieved by nitro.  Hemoccult positive. CBC shows Hbg 12.9, 16.4 10/30/2013. Discussed patient history, condition, and labs with Dr. Alvino Chapel who agrees admission for GI bleed. I-stat troponin negative.  Re-eval Patient reports chest discomfort is 2/10.  Discussed need for admission for chest pain and GI bleed. Discussed patient history and condition with Dr. Gwenlyn Found, agrees to consultation for [facility]. Discussed patient history, condition, labs, and imaging with Dr. Roel Cluck who will evaluated the pateint in the ED.    Lorrine Kin, PA-C 11/18/13 [phone]

## 2013-11-17 NOTE — ED Notes (Signed)
Family at bedside. 

## 2013-11-17 NOTE — Progress Notes (Signed)
Hypoglycemic Event  CBG: 54  Treatment: 15 GM carbohydrate snack; 270ml of OJ & [place] sandwich  Symptoms: weak, sweaty  Follow-up CBG: Time:2110 CBG Result:78  Possible Reasons for Event: Inadequate meal intake  Comments/MD notified: K. Schoor, NP   No new orders given will continue to monitor the pt.Hypoglycemia order set in place. Will still given 10 units of Lantus.  Nevin Bloodgood R  Remember to initiate Hypoglycemia Order Set & complete

## 2013-11-18 ENCOUNTER — Encounter (HOSPITAL_COMMUNITY): Payer: Self-pay | Admitting: Certified Registered"

## 2013-11-18 DIAGNOSIS — I472 Ventricular tachycardia, unspecified: Secondary | ICD-10-CM | POA: Diagnosis not present

## 2013-11-18 DIAGNOSIS — K922 Gastrointestinal hemorrhage, unspecified: Secondary | ICD-10-CM

## 2013-11-18 DIAGNOSIS — D696 Thrombocytopenia, unspecified: Secondary | ICD-10-CM | POA: Diagnosis not present

## 2013-11-18 DIAGNOSIS — E7800 Pure hypercholesterolemia, unspecified: Secondary | ICD-10-CM

## 2013-11-18 DIAGNOSIS — I200 Unstable angina: Secondary | ICD-10-CM | POA: Diagnosis not present

## 2013-11-18 DIAGNOSIS — I25810 Atherosclerosis of coronary artery bypass graft(s) without angina pectoris: Secondary | ICD-10-CM | POA: Diagnosis not present

## 2013-11-18 DIAGNOSIS — I059 Rheumatic mitral valve disease, unspecified: Secondary | ICD-10-CM

## 2013-11-18 DIAGNOSIS — E119 Type 2 diabetes mellitus without complications: Secondary | ICD-10-CM

## 2013-11-18 DIAGNOSIS — I209 Angina pectoris, unspecified: Secondary | ICD-10-CM

## 2013-11-18 DIAGNOSIS — R072 Precordial pain: Secondary | ICD-10-CM

## 2013-11-18 DIAGNOSIS — K625 Hemorrhage of anus and rectum: Secondary | ICD-10-CM

## 2013-11-18 LAB — BASIC METABOLIC PANEL
BUN: 16 mg/dL (ref 6–23)
CHLORIDE: 107 meq/L (ref 96–112)
CO2: 25 mEq/L (ref 19–32)
CREATININE: 1.22 mg/dL (ref 0.50–1.35)
Calcium: 8.4 mg/dL (ref 8.4–10.5)
GFR calc Af Amer: 70 mL/min — ABNORMAL LOW (ref >90)
GFR calc non Af Amer: 61 mL/min — ABNORMAL LOW (ref >90)
GLUCOSE: 72 mg/dL (ref 70–99)
Potassium: 3.7 mEq/L (ref 3.7–5.3)
Sodium: 143 mEq/L (ref 137–147)

## 2013-11-18 LAB — CBC
HCT: 36.1 % — ABNORMAL LOW (ref 39.0–52.0)
HCT: 37.3 % — ABNORMAL LOW (ref 39.0–52.0)
HEMATOCRIT: 36.5 % — AB (ref 39.0–52.0)
HEMOGLOBIN: 12.7 g/dL — AB (ref 13.0–17.0)
HEMOGLOBIN: 12.9 g/dL — AB (ref 13.0–17.0)
Hemoglobin: 12.3 g/dL — ABNORMAL LOW (ref 13.0–17.0)
MCH: 29.8 pg (ref 26.0–34.0)
MCH: 30.4 pg (ref 26.0–34.0)
MCH: 30.1 pg (ref 26.0–34.0)
MCHC: 34.1 g/dL (ref 30.0–36.0)
MCHC: 34.8 g/dL (ref 30.0–36.0)
MCHC: 34.6 g/dL (ref 30.0–36.0)
MCV: 87.4 fL (ref 78.0–100.0)
MCV: 87.3 fL (ref 78.0–100.0)
MCV: 87.1 fL (ref 78.0–100.0)
Platelets: 130 10*3/uL — ABNORMAL LOW (ref 150–400)
Platelets: 128 10*3/uL — ABNORMAL LOW (ref 150–400)
Platelets: 131 10*3/uL — ABNORMAL LOW (ref 150–400)
RBC: 4.13 MIL/uL — AB (ref 4.22–5.81)
RBC: 4.18 MIL/uL — ABNORMAL LOW (ref 4.22–5.81)
RBC: 4.28 MIL/uL (ref 4.22–5.81)
RDW: 15 % (ref 11.5–15.5)
RDW: 15 % (ref 11.5–15.5)
RDW: 15 % (ref 11.5–15.5)
WBC: 4.7 10*3/uL (ref 4.0–10.5)
WBC: 5.2 10*3/uL (ref 4.0–10.5)
WBC: 4.5 10*3/uL (ref 4.0–10.5)

## 2013-11-18 LAB — GLUCOSE, CAPILLARY
GLUCOSE-CAPILLARY: 72 mg/dL (ref 70–99)
GLUCOSE-CAPILLARY: 80 mg/dL (ref 70–99)
Glucose-Capillary: 72 mg/dL (ref 70–99)
Glucose-Capillary: 83 mg/dL (ref 70–99)
Glucose-Capillary: 127 mg/dL — ABNORMAL HIGH (ref 70–99)

## 2013-11-18 LAB — TROPONIN I: Troponin I: <0.3 ng/mL (ref <0.30)

## 2013-11-18 LAB — LIPID PANEL
Cholesterol: 106 mg/dL (ref 0–200)
HDL: 31 mg/dL — ABNORMAL LOW (ref >39)
LDL Cholesterol: 60 mg/dL (ref 0–99)
Total CHOL/HDL Ratio: 3.4 RATIO
Triglycerides: 73 mg/dL (ref <150)
VLDL: 15 mg/dL (ref 0–40)

## 2013-11-18 LAB — PROTIME-INR
INR: 1.02 (ref 0.00–1.49)
Prothrombin Time: 13.2 seconds (ref 11.6–15.2)

## 2013-11-18 LAB — MAGNESIUM: Magnesium: 1.7 mg/dL (ref 1.5–2.5)

## 2013-11-18 LAB — TSH: TSH: 1.097 u[IU]/mL (ref 0.350–4.500)

## 2013-11-18 LAB — OCCULT BLOOD, POC DEVICE: FECAL OCCULT BLD: POSITIVE — AB

## 2013-11-18 LAB — HEMOGLOBIN A1C
HEMOGLOBIN A1C: 12.9 % — AB (ref <5.7)
Mean Plasma Glucose: 324 mg/dL — ABNORMAL HIGH (ref <117)

## 2013-11-18 MED ORDER — NITROGLYCERIN 0.4 MG SL SUBL: 0.4000 mg | SUBLINGUAL_TABLET | SUBLINGUAL | Status: DC | PRN

## 2013-11-18 MED ORDER — PANTOPRAZOLE SODIUM 40 MG IV SOLR
40.0000 mg | Freq: Two times a day (BID) | INTRAVENOUS | Status: DC
  Administered 2013-11-18 – 2013-11-19 (×3): 40 mg via INTRAVENOUS
  Filled 2013-11-18 (×6): qty 40

## 2013-11-18 NOTE — Consult Note (Signed)
[facility] Gastroenterology Consultation Note  Referring Provider:  Dr. Niel Hummer Primary Care Physician:  Stacy Gardner, PA-C Primary Gastroenterologist:  Dr. Clarene Essex  Reason for Consultation:  Melena, anemia  HPI: Antonio [name] is a 68 y.o. male whom we've been asked to see for evaluation of melena.  Patient has known heart disease and takes Effient chronically.  He has been taking lots of Goody's powder recently for headaches.  Couple days ago, patient began having black stools.  No hematochezia or hematemesis.  No abdominal pain.  No nausea, vomiting, GERD, dysphagia.  No known prior endoscopy.  Had colonoscopy last in April 2011 by Dr. 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   Past Medical History  Diagnosis Date  . 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 HYPERCHOLESTEROLEMIA 12/17/2010  . HYPERTENSION 12/17/2010  . CAD     a. s/p CABG 1996 - last cath 08/2013 - patent grafts.  Marland Kitchen BENIGN PROSTATIC HYPERTROPHY, WITH OBSTRUCTION 12/17/2010  . Prostate cancer     s/p radiation   . Myocardial infarction 1996; 2006  . Type II diabetes mellitus 12/17/2010  . Bleeding per rectum     "related to prostate cancer and hemorrhoids" (09/03/2012)  . GERD (gastroesophageal reflux disease)   . Urine incontinence   . Chicken pox     Past Surgical History  Procedure Laterality Date  . Cardiac catheterization  01/27/2009    ef 60%  . Coronary artery bypass graft  1996    LIMA GRAFT TO THE LAD, SAPHENOUS VEIN GRAFT SEQUENTIALLY TO THE FIRST AND SECOND OBTUSE MARGINAL VESSELS, SAPHENOUS VEIN GRAFT TO THE DIAGONAL, AND SAPHENOUS VEIN GRAFT TO THE DISTAL RIGHT CORONARY   . Laceration repair  1980's    LEFT HAND  . Foot surgery  1980's    LEFT, "shot a nail gun thru it" (09/03/2012)  . Cervical disc surgery  ? 1990's    "went in on the side" (09/03/2012)  . Cardiac catheterization  08/25/2012    Severe 3v  obstructive CAD, continued graft patency (SVG-D,  SVG-OM1-OM2, SVG-PDA, LIMA-LAD). area of diffuse dz in the distal LCx (up to 90%) unamenable to PCI    Prior to Admission medications   Medication Sig Start Date End Date Taking? Authorizing Provider  aspirin EC 81 MG tablet Take 81 mg by mouth daily.   Yes Historical Provider, MD  Insulin Glargine (LANTUS SOLOSTAR) 100 UNIT/ML Solostar Pen Inject 20 Units into the skin every morning.   Yes Historical Provider, MD  Insulin Pen Needle (BD PEN NEEDLE NANO U/F) 32G X 4 MM MISC 1 pen by Does not apply route daily. 11/11/13  Yes Stacy Gardner, PA-C  losartan-hydrochlorothiazide (HYZAAR) 100-25 MG per tablet Take 1 tablet by mouth daily.   Yes Historical Provider, MD  metoprolol (LOPRESSOR) 50 MG tablet Take 1 tablet (50 mg total) by mouth 2 (two) times daily. 02/02/13  Yes Rogelia Mire, NP  Multiple Vitamin (MULTIVITAMIN WITH MINERALS) TABS Take 1 tablet by mouth daily.   Yes Historical Provider, MD  nitroGLYCERIN (NITROSTAT) 0.4 MG SL tablet Place 1 tablet (0.4 mg total) under the tongue every 5 (five) minutes x 3 doses as needed for chest pain. 02/02/13  Yes Rogelia Mire, NP  simvastatin (ZOCOR) 80 MG tablet Take 0.5 tablets (40 mg total) by mouth every morning. 09/22/13  Yes Peter M Martinique, MD  tadalafil (CIALIS) 20 MG tablet Take 20 mg by mouth daily  as needed for erectile dysfunction.   Yes Historical Provider, MD  traMADol (ULTRAM) 50 MG tablet Take 1 tablet (50 mg total) by mouth every 6 (six) hours as needed for pain. 06/18/13  Yes Peter M Martinique, MD  prasugrel (EFFIENT) 10 MG TABS tablet Take 1 tablet (10 mg total) by mouth daily. 06/02/13   Peter M Martinique, MD    Current Facility-Administered Medications  Medication Dose Route Frequency Provider Last Rate Last Dose  . 0.9 %  sodium chloride infusion  250 mL Intravenous PRN Toy Baker, MD      . acetaminophen (TYLENOL) tablet 650 mg  650 mg Oral Q4H PRN Toy Baker, MD    650 mg at 11/18/13 0756  . aspirin EC tablet 81 mg  81 mg Oral Daily Toy Baker, MD   81 mg at 11/18/13 0955  . atorvastatin (LIPITOR) tablet 40 mg  40 mg Oral q1800 Toy Baker, MD      . insulin aspart (novoLOG) injection 0-9 Units  0-9 Units Subcutaneous Q4H Toy Baker, MD   1 Units at 11/18/13 0018  . metoprolol (LOPRESSOR) tablet 50 mg  50 mg Oral BID Toy Baker, MD   50 mg at 11/18/13 0955  . nitroGLYCERIN (NITROSTAT) SL tablet 0.4 mg  0.4 mg Sublingual Q5 min PRN Belkys A Regalado, MD      . ondansetron (ZOFRAN) injection 4 mg  4 mg Intravenous Q6H PRN Toy Baker, MD      . pantoprazole (PROTONIX) injection 40 mg  40 mg Intravenous Q12H Belkys A Regalado, MD   40 mg at 11/18/13 0956  . sodium chloride 0.9 % injection 3 mL  3 mL Intravenous Q12H Toy Baker, MD   3 mL at 11/18/13 1004  . sodium chloride 0.9 % injection 3 mL  3 mL Intravenous PRN Toy Baker, MD      . traMADol (ULTRAM) tablet 50 mg  50 mg Oral Q6H PRN Toy Baker, MD        Allergies as of 11/17/2013 - Review Complete 11/17/2013  Allergen Reaction Noted  . Demerol Other (See Comments) 06/21/2011  . Meperidine and related  11/11/2013    Family History  Problem Relation Age of Onset  . Diabetes Other     2 siblings, 1 of whom is deceased  . Hypertension Other   . Cancer Other     Lung Cancer  . Cancer Other     FH of Prostate Cancer 1st degree relative  . Cancer Mother     breast  . Hypertension Mother   . Hypertension Father     History   Social History  . Marital Status: Married    Spouse Name: N/A    Number of Children: N/A  . Years of Education: N/A   Occupational History  . 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 Smoking status: Former Smoker -- 1.00 packs/day for 37 years    Types: Cigarettes    Quit date: 10/14/2002  . Smokeless tobacco: Never Used  . Alcohol Use: No  . Drug Use: No  . Sexual Activity: Yes    Other Topics Concern  . Not on file   Social History Narrative   Regular exercise-yes    Review of Systems:  ROS Doutova 11/17/13 reviewed and I agree  Physical Exam: Vital signs in last 24 hours: Temp:  [98.6 F (37 C)-98.8 F (37.1 C)] 98.8 F (37.1 C) (02/05 0847) Pulse Rate:  [  60-71] 64 (02/05 0948) Resp:  [12-22] 14 (02/05 0847) BP: (102-139)/(58-94) 123/80 mmHg (02/05 0948) SpO2:  [96 %-100 %] 98 % (02/05 0847) Weight:  [78.4 kg (172 lb 13.5 oz)] 78.4 kg (172 lb 13.5 oz) (02/04 1606) Last BM Date: 11/17/13 General:   Alert,  Well-developed, well-nourished, pleasant and cooperative in NAD Head:  Normocephalic and atraumatic. Eyes:  Sclera clear, no icterus.   Conjunctiva pink. Ears:  Normal auditory acuity. Nose:  No deformity, discharge,  or lesions. Mouth:  No deformity or lesions.  Oropharynx pink & moist. Neck:  Supple; no masses or thyromegaly. Lungs:  Old midline sternotomy scar; Clear throughout to auscultation.   No wheezes, crackles, or rhonchi. No acute distress. Heart:  Regular rate and rhythm; no murmurs, clicks, rubs,  or gallops. Abdomen:  Soft, nontender and nondistended. No masses, hepatosplenomegaly or hernias noted. Normal bowel sounds, without guarding, and without rebound.     Msk:  Symmetrical without gross deformities. Normal posture. Pulses:  Normal pulses noted. Extremities:  Without clubbing or edema. Neurologic:  Alert and  oriented x4;  grossly normal neurologically. Skin:  Intact without significant lesions or rashes. Psych:  Alert and cooperative. Normal mood and affect.  Lab Results:  Recent Labs  11/17/13 1608 11/17/13 2232 11/18/13 0610  WBC 5.1 5.0 4.7  HGB 12.9* 12.1* 12.3*  HCT 37.7* 35.8* 36.1*  PLT 138* 120* 130*   BMET  Recent Labs  11/17/13 1608 11/18/13 0610  NA 144 143  K 3.8 3.7  CL 106 107  CO2 27 25  GLUCOSE 186* 72  BUN 18 16  CREATININE 1.29 1.22  CALCIUM 9.0 8.4   LFT No results found for this  basename: PROT, ALBUMIN, AST, ALT, ALKPHOS, BILITOT, BILIDIR, IBILI,  in the last 72 hours PT/INR  Recent Labs  11/18/13 0610  LABPROT 13.2  INR 1.02    Studies/Results: Dg Chest 2 View  11/17/2013   CLINICAL DATA:  Left chest pain/pressure  EXAM: CHEST  2 VIEW  COMPARISON:  02/01/2013  FINDINGS: Lungs are clear. No pleural effusion or pneumothorax.  The heart is normal in size. Postsurgical changes related to prior CABG.  Mild degenerative changes of the visualized thoracolumbar spine. Cervical spine fixation hardware.  IMPRESSION: No evidence of acute cardiopulmonary disease.   Electronically Signed   By: Julian Hy M.D.   On: 11/17/2013 17:28   Impression:  1.  Melena, in setting of Effient and recent intake of Goody's powders.  No rampant or hemodynamically unstable bleeding at this time. 2.  Anemia, mild, likely blood loss mediated. 3.  Chest pain with known cardiac disease, followed by cardiology.  Plan:  1.  Clear liquid diet, NPO after midnight. 2.  PPI. 3.  Continue to hold Effient. 4.  Endoscopy tomorrow. 5.  Risks (bleeding, infection, bowel perforation that could require surgery, sedation-related changes in cardiopulmonary systems), benefits (identification and possible treatment of source of symptoms, exclusion of certain causes of symptoms), and alternatives (watchful waiting, radiographic imaging studies, empiric medical treatment) of upper endoscopy (EGD) were explained to patient/family in detail and patient wishes to proceed.   LOS: 1 day   Dane Bloch M  11/18/2013, 10:26 AM

## 2013-11-18 NOTE — Progress Notes (Signed)
[facility] PROGRESS NOTE  Antonio [name] BPZ:[id number] DOB: 1946-08-28 DOA: 11/17/2013 PCP: Stacy Gardner, PA-C  Assessment/Plan: 1-Chest pain: Angina ? Marland Kitchen Resolved with Nitroglycerin gtt. Troponin times 2 negative. Will discontinue nitroglycerin Gtt. Order PRN nitroglycerin. On metoprolol, Lipitor.  2-GI bleed, melena; could be multifactorial hemorrhoids but also patient relates melena, takes NSAID. Hb stable at 12. Hb last month at 16. Start IV protonix Gtt.  3-Diabetes; Hold Lantus due to NPO status. SSI.  4-Thrombocytopenia; monitor.   5-DVT prophylaxis: SCD>   Code Status: Full Code.  Family Communication: Care discussed with patient.  Disposition Plan: to be determine.    Consultants:  Cardio  GI  Procedures:  none  Antibiotics:  none  HPI/Subjective: Chest pain has resolved. Complaining of headache since he was started on nitroglycerin.  He relates table spoon of blood with BM. He also report melena. He takes goody powder.   Objective: Filed Vitals:   11/18/13 0500  BP: 102/58  Pulse: 63  Temp: 98.8 F (37.1 C)  Resp: 18    Intake/Output Summary (Last 24 hours) at 11/18/13 0811 Last data filed at 11/18/13 0801  Gross per 24 hour  Intake    720 ml  Output    100 ml  Net    620 ml   Filed Weights   11/17/13 1606  Weight: 78.4 kg (172 lb 13.5 oz)    Exam:   General:  No distress.   Cardiovascular: S 1, S 2 RRR  Respiratory: CTA  Abdomen: BS present, soft. Nt  Musculoskeletal: no edema.   Data Reviewed: Basic Metabolic Panel:  Recent Labs Lab 11/17/13 1608 11/18/13 0610  NA 144 143  K 3.8 3.7  CL 106 107  CO2 27 25  GLUCOSE 186* 72  BUN 18 16  CREATININE 1.29 1.22  CALCIUM 9.0 8.4  MG  --  1.7   Liver Function Tests: No results found for this basename: AST, ALT, ALKPHOS, BILITOT, PROT, ALBUMIN,  in the last 168 hours No results found for this basename: LIPASE, AMYLASE,  in the last 168 hours No results found for this  basename: AMMONIA,  in the last 168 hours CBC:  Recent Labs Lab 11/17/13 1608 11/17/13 2232 11/18/13 0610  WBC 5.1 5.0 4.7  HGB 12.9* 12.1* 12.3*  HCT 37.7* 35.8* 36.1*  MCV 87.9 87.5 87.4  PLT 138* 120* 130*   Cardiac Enzymes:  Recent Labs Lab 11/17/13 2232 11/18/13 0610  TROPONINI <0.30 <0.30   BNP (last 3 results)  Recent Labs  11/17/13 1608  PROBNP 219.5*   CBG:  Recent Labs Lab 11/17/13 2049 11/17/13 2110 11/17/13 2326 11/18/13 0436 11/18/13 0737  GLUCAP 54* 78 143* 72 83    No results found for this or any previous visit (from the past 240 hour(s)).   Studies: Dg Chest 2 View  11/17/2013   CLINICAL DATA:  Left chest pain/pressure  EXAM: CHEST  2 VIEW  COMPARISON:  02/01/2013  FINDINGS: Lungs are clear. No pleural effusion or pneumothorax.  The heart is normal in size. Postsurgical changes related to prior CABG.  Mild degenerative changes of the visualized thoracolumbar spine. Cervical spine fixation hardware.  IMPRESSION: No evidence of acute cardiopulmonary disease.   Electronically Signed   By: Julian Hy M.D.   On: 11/17/2013 17:28    Scheduled Meds: . aspirin EC  81 mg Oral Daily  . atorvastatin  40 mg Oral q1800  . insulin aspart  0-9 Units Subcutaneous Q4H  . insulin glargine  10 Units Subcutaneous Daily  . metoprolol  50 mg Oral BID  . pantoprazole  40 mg Oral Q1200  . sodium chloride  3 mL Intravenous Q12H   Continuous Infusions: . nitroGLYCERIN 10 mcg/min (11/17/13 2056)    Principal Problem:   Chest pain Active Problems:   DM   HYPERTENSION   BRBPR (bright red blood per rectum)   Lower GI bleeding   CAD (coronary artery disease) of artery bypass graft   Midsternal chest pain    Time spent: 35 minutes, coordinating care.     REGALADO,BELKYS  [facility] Pager [phone]. If 7PM-7AM, please contact night-coverage at [email or web address], password [facility] 11/18/2013, 8:11 AM  LOS: 1 day

## 2013-11-18 NOTE — Plan of Care (Signed)
Problem: Consults Goal: Tobacco Cessation referral if indicated Outcome: Not Applicable Date Met:  76/22/63 Pt quit in 2004 Goal: Diabetes Guidelines if Diabetic/Glucose > 140 If diabetic or lab glucose is > 140 mg/dl - Initiate Diabetes/Hyperglycemia Guidelines & Document Interventions  Outcome: Completed/Met Date Met:  11/18/13 Pt is getting sliding scale of novolog q4hrs.   Problem: Phase I Progression Outcomes Goal: Anginal pain relieved Outcome: Progressing Pt currently pain free on nitro drip. Goal: MD aware of Cardiac Marker results Outcome: Progressing Troponin negative so far

## 2013-11-18 NOTE — Care Management Note (Unsigned)
    Page 1 of 1   11/18/2013     12:36:43 PM   CARE MANAGEMENT NOTE 11/18/2013  Patient:  Antonio [name], Antonio [name]   Account Number:  [id number]  Date Initiated:  11/18/2013  Documentation initiated by:  GRAVES-BIGELOW,Gavriel Holzhauer  Subjective/Objective Assessment:   Pt admitted for cp. Hx CAD and CABG. Initiated iv nitro. + Hemoccult and plan for EGD in am and Cath on Monday.     Action/Plan:   CM will continue to monitor for disposition needs.   Anticipated DC Date:  11/23/2013   Anticipated DC Plan:  [place]  CM consult      Choice offered to / List presented to:             Status of service:  In process, will continue to follow Medicare Important Message given?   (If response is "NO", the following Medicare IM given date fields will be blank) Date Medicare IM given:   Date Additional Medicare IM given:    Discharge Disposition:    Per UR Regulation:  Reviewed for med. necessity/level of care/duration of stay  If discussed at [place] of Stay Meetings, dates discussed:    Comments:

## 2013-11-18 NOTE — Progress Notes (Signed)
Pt c/o 4-5 episodes of  CP/discomfort (7/10) radiating down left arm.  Each episode lasted only 1-2 sec and happened over a period of about 20 min. Vitals stable.  He is to call RN with any further episodes.

## 2013-11-18 NOTE — Progress Notes (Signed)
Have changed cath to Monday the 9th.  1st case.  For EGD on Friday.

## 2013-11-18 NOTE — Progress Notes (Signed)
UR Completed Darra [name] Graves-Bigelow, RN,BSN [phone]  

## 2013-11-18 NOTE — Progress Notes (Signed)
Pt had 9 beats of non-sustaining v-tach around 0229. NP notified. New orders to check mg levels. Pt asymptomatic. Will continue to monitor the pt. Hoover Brunette, RN

## 2013-11-18 NOTE — Progress Notes (Signed)
Subjective: No chest pain today, relief with NTG  Objective: Vital signs in last 24 hours: Temp:  [98.6 F (37 C)-98.8 F (37.1 C)] 98.8 F (37.1 C) (02/05 0500) Pulse Rate:  [63-71] 63 (02/05 0500) Resp:  [12-22] 18 (02/05 0500) BP: (102-139)/(58-94) 102/58 mmHg (02/05 0500) SpO2:  [96 %-100 %] 96 % (02/05 0500) Weight:  [172 lb 13.5 oz (78.4 kg)] 172 lb 13.5 oz (78.4 kg) (02/04 1606) Weight change:  Last BM Date: 11/17/13 Intake/Output from previous day: +720 02/04 0701 - 02/05 0700 In: 720 [P.O.:720] Out: -  Intake/Output this shift:    PE: General:Pleasant affect, NAD Skin:Warm and dry, brisk capillary refill HEENT:normocephalic, sclera clear, mucus membranes moist Neck:supple, no JVD  Heart:S1S2 RRR without murmur, gallup, rub or click Lungs:clear without rales, rhonchi, or wheezes WFU:XNAT, non tender, + BS, do not palpate liver spleen or masses Ext:no lower ext edema, 2+ pedal pulses, 2+ radial pulses Neuro:alert and oriented, MAE, follows commands, + facial symmetry   EKG SR without acute changes.  Lab Results:  Recent Labs  11/17/13 2232 11/18/13 0610  WBC 5.0 4.7  HGB 12.1* 12.3*  HCT 35.8* 36.1*  PLT 120* 130*   BMET  Recent Labs  11/17/13 1608 11/18/13 0610  NA 144 143  K 3.8 3.7  CL 106 107  CO2 27 25  GLUCOSE 186* 72  BUN 18 16  CREATININE 1.29 1.22  CALCIUM 9.0 8.4    Recent Labs  11/17/13 2232 11/18/13 0610  TROPONINI <0.30 <0.30    Lab Results  Component Value Date   CHOL 106 11/18/2013   HDL 31* 11/18/2013   LDLCALC 60 11/18/2013   TRIG 73 11/18/2013   CHOLHDL 3.4 11/18/2013   Lab Results  Component Value Date   HGBA1C 8.6* 02/01/2013     Lab Results  Component Value Date   TSH 0.94 12/17/2010    Hepatic Function Panel No results found for this basename: PROT, ALBUMIN, AST, ALT, ALKPHOS, BILITOT, BILIDIR, IBILI,  in the last 72 hours  Recent Labs  11/18/13 0610  CHOL 106   No results found for this  basename: PROTIME,  in the last 72 hours     Studies/Results: Dg Chest 2 View  11/17/2013   CLINICAL DATA:  Left chest pain/pressure  EXAM: CHEST  2 VIEW  COMPARISON:  02/01/2013  FINDINGS: Lungs are clear. No pleural effusion or pneumothorax.  The heart is normal in size. Postsurgical changes related to prior CABG.  Mild degenerative changes of the visualized thoracolumbar spine. Cervical spine fixation hardware.  IMPRESSION: No evidence of acute cardiopulmonary disease.   Electronically Signed   By: Julian Hy M.D.   On: 11/17/2013 17:28    Medications: I have reviewed the patient's current medications. Scheduled Meds: . aspirin EC  81 mg Oral Daily  . atorvastatin  40 mg Oral q1800  . insulin aspart  0-9 Units Subcutaneous Q4H  . insulin glargine  10 Units Subcutaneous Daily  . metoprolol  50 mg Oral BID  . pantoprazole  40 mg Oral Q1200  . sodium chloride  3 mL Intravenous Q12H   Continuous Infusions: . nitroGLYCERIN 10 mcg/min (11/17/13 2056)   PRN Meds:.sodium chloride, acetaminophen, ondansetron (ZOFRAN) IV, sodium chloride, traMADol  Assessment/Plan: Active Problems:   DM   HYPERTENSION   BRBPR (bright red blood per rectum)   Lower GI bleeding   CAD (coronary artery disease) of artery bypass graft   Midsternal chest  pain   Chest pain  PLAN:  Neg MI, LDL well controlled at 60 though HDL 31. No more chest pain on NTG drip, now down to 8 mcg secondary to headache.  9 beats of NSVT early AM. Plan for scope today and cardiac cath tomorrow.  Needs GI consult today with ongoing rectal bleeding and heme+ stools. VS stable.  LOS: 1 day   Time spent with pt. :15 minutes. [facility]  Nurse Practitioner Certified Pager [phone] or after 5pm and on weekends call [phone] 11/18/2013, 7:43 AM

## 2013-11-18 NOTE — Progress Notes (Signed)
Patient seen and examined along with PA and agree with note as outlined above.  He needs to have GI bleeding addressed first before committing to any cath with possible PCI since that would require possible anticoagulation.  He will have endoscopy done this am and plan left heart cath tomorrow.   He does not have any active angina and his cardiac enzymes are negative.  His CP that he had was similar to his prior angina and improves with NTG.  He also had nonsustained VT last PM which may be due to ischemia.    Effient on hold due to GI bleeding.  Continue low dose ASA/statin/beta blocker.  Cannot increase beta blocker further for VT suppression due to borderline bradycardia.

## 2013-11-18 NOTE — Progress Notes (Signed)
  Echocardiogram 2D Echocardiogram has been performed.  [place], [place] 11/18/2013, 4:14 PM

## 2013-11-19 ENCOUNTER — Inpatient Hospital Stay (HOSPITAL_COMMUNITY): Payer: BC Managed Care – PPO | Admitting: Certified Registered"

## 2013-11-19 ENCOUNTER — Encounter (HOSPITAL_COMMUNITY)
Admission: EM | Disposition: A | Discharge status: Home / Self Care | Payer: Self-pay | Source: Home / Self Care | Attending: Internal Medicine

## 2013-11-19 ENCOUNTER — Encounter (HOSPITAL_COMMUNITY): Payer: BC Managed Care – PPO | Admitting: Certified Registered"

## 2013-11-19 ENCOUNTER — Encounter (HOSPITAL_COMMUNITY): Payer: Self-pay | Admitting: Certified Registered"

## 2013-11-19 DIAGNOSIS — D696 Thrombocytopenia, unspecified: Secondary | ICD-10-CM | POA: Diagnosis not present

## 2013-11-19 DIAGNOSIS — I10 Essential (primary) hypertension: Secondary | ICD-10-CM

## 2013-11-19 DIAGNOSIS — I200 Unstable angina: Secondary | ICD-10-CM | POA: Diagnosis not present

## 2013-11-19 DIAGNOSIS — I472 Ventricular tachycardia, unspecified: Secondary | ICD-10-CM | POA: Diagnosis not present

## 2013-11-19 DIAGNOSIS — I25810 Atherosclerosis of coronary artery bypass graft(s) without angina pectoris: Secondary | ICD-10-CM | POA: Diagnosis not present

## 2013-11-19 HISTORY — PX: ESOPHAGOGASTRODUODENOSCOPY: SHX5428

## 2013-11-19 LAB — BASIC METABOLIC PANEL
BUN: 11 mg/dL (ref 6–23)
CHLORIDE: 107 meq/L (ref 96–112)
CO2: 25 mEq/L (ref 19–32)
Calcium: 8.5 mg/dL (ref 8.4–10.5)
Creatinine, Ser: 1.2 mg/dL (ref 0.50–1.35)
GFR calc Af Amer: 72 mL/min — ABNORMAL LOW (ref >90)
GFR calc non Af Amer: 62 mL/min — ABNORMAL LOW (ref >90)
Glucose, Bld: 95 mg/dL (ref 70–99)
POTASSIUM: 4 meq/L (ref 3.7–5.3)
Sodium: 143 mEq/L (ref 137–147)

## 2013-11-19 LAB — GLUCOSE, CAPILLARY
GLUCOSE-CAPILLARY: 105 mg/dL — AB (ref 70–99)
GLUCOSE-CAPILLARY: 106 mg/dL — AB (ref 70–99)
GLUCOSE-CAPILLARY: 84 mg/dL (ref 70–99)
Glucose-Capillary: 111 mg/dL — ABNORMAL HIGH (ref 70–99)
Glucose-Capillary: 206 mg/dL — ABNORMAL HIGH (ref 70–99)
Glucose-Capillary: 89 mg/dL (ref 70–99)

## 2013-11-19 LAB — CBC
HEMATOCRIT: 37.7 % — AB (ref 39.0–52.0)
HEMOGLOBIN: 13 g/dL (ref 13.0–17.0)
MCH: 30 pg (ref 26.0–34.0)
MCHC: 34.5 g/dL (ref 30.0–36.0)
MCV: 87.1 fL (ref 78.0–100.0)
Platelets: 132 10*3/uL — ABNORMAL LOW (ref 150–400)
RBC: 4.33 MIL/uL (ref 4.22–5.81)
RDW: 14.8 % (ref 11.5–15.5)
WBC: 4.7 10*3/uL (ref 4.0–10.5)

## 2013-11-19 SURGERY — EGD (ESOPHAGOGASTRODUODENOSCOPY)
Anesthesia: Monitor Anesthesia Care | Laterality: Left

## 2013-11-19 MED ORDER — OXYCODONE HCL 5 MG/5ML PO SOLN: 5.0000 mg | Freq: Once | ORAL | Status: DC | PRN

## 2013-11-19 MED ORDER — ONDANSETRON HCL 4 MG/2ML IJ SOLN: 4.0000 mg | Freq: Four times a day (QID) | INTRAMUSCULAR | Status: DC | PRN

## 2013-11-19 MED ORDER — OXYCODONE HCL 5 MG PO TABS: 5.0000 mg | ORAL_TABLET | Freq: Once | ORAL | Status: DC | PRN

## 2013-11-19 MED ORDER — FENTANYL CITRATE 0.05 MG/ML IJ SOLN: 25.0000 ug | INTRAMUSCULAR | Status: DC | PRN

## 2013-11-19 MED ORDER — FENTANYL CITRATE 0.05 MG/ML IJ SOLN
INTRAMUSCULAR | Status: DC | PRN
  Administered 2013-11-19: 50 ug via INTRAVENOUS

## 2013-11-19 MED ORDER — SODIUM CHLORIDE 0.9 % IV SOLN: INTRAVENOUS | Status: DC

## 2013-11-19 MED ORDER — AMLODIPINE BESYLATE 5 MG PO TABS
5.0000 mg | ORAL_TABLET | Freq: Every day | ORAL | Status: DC
  Administered 2013-11-20 – 2013-11-22 (×3): 5 mg via ORAL
  Filled 2013-11-19 (×4): qty 1

## 2013-11-19 MED ORDER — MIDAZOLAM HCL 5 MG/5ML IJ SOLN
INTRAMUSCULAR | Status: DC | PRN
  Administered 2013-11-19: 1 mg via INTRAVENOUS

## 2013-11-19 MED ORDER — PRASUGREL HCL 10 MG PO TABS
10.0000 mg | ORAL_TABLET | Freq: Every day | ORAL | Status: DC
  Administered 2013-11-19 – 2013-11-22 (×5): 10 mg via ORAL
  Filled 2013-11-19 (×5): qty 1

## 2013-11-19 MED ORDER — PROPOFOL INFUSION 10 MG/ML OPTIME
INTRAVENOUS | Status: DC | PRN
  Administered 2013-11-19: 75 ug/kg/min via INTRAVENOUS

## 2013-11-19 MED ORDER — MORPHINE SULFATE 2 MG/ML IJ SOLN
0.5000 mg | INTRAMUSCULAR | Status: DC | PRN
  Administered 2013-11-19 (×2): 0.5 mg via INTRAVENOUS
  Filled 2013-11-19 (×2): qty 1

## 2013-11-19 MED ORDER — LIVING WELL WITH DIABETES BOOK
Freq: Once | Status: DC
  Filled 2013-11-19: qty 1

## 2013-11-19 MED ORDER — SODIUM CHLORIDE 0.9 % IV SOLN
INTRAVENOUS | Status: DC
  Administered 2013-11-22: 06:00:00 via INTRAVENOUS

## 2013-11-19 NOTE — Progress Notes (Signed)
Cardiologist on call [place] notified of pt's chest discomfort. Will continue to monitor the pt. Hoover Brunette, RN

## 2013-11-19 NOTE — H&P (View-Only) (Signed)
[facility] Gastroenterology Consultation Note  Referring Provider:  Dr. Niel Hummer Primary Care Physician:  Stacy Gardner, PA-C Primary Gastroenterologist:  Dr. Clarene Essex  Reason for Consultation:  Melena, anemia  HPI: Antonio [name] is a 68 y.o. male whom we've been asked to see for evaluation of melena.  Patient has known heart disease and takes Effient chronically.  He has been taking lots of Goody's powder recently for headaches.  Couple days ago, patient began having black stools.  No hematochezia or hematemesis.  No abdominal pain.  No nausea, vomiting, GERD, dysphagia.  No known prior endoscopy.  Had colonoscopy last in April 2011 by Dr. 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   Past Medical History  Diagnosis Date  . 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 HYPERCHOLESTEROLEMIA 12/17/2010  . HYPERTENSION 12/17/2010  . CAD     a. s/p CABG 1996 - last cath 08/2013 - patent grafts.  Marland Kitchen BENIGN PROSTATIC HYPERTROPHY, WITH OBSTRUCTION 12/17/2010  . Prostate cancer     s/p radiation   . Myocardial infarction 1996; 2006  . Type II diabetes mellitus 12/17/2010  . Bleeding per rectum     "related to prostate cancer and hemorrhoids" (09/03/2012)  . GERD (gastroesophageal reflux disease)   . Urine incontinence   . Chicken pox     Past Surgical History  Procedure Laterality Date  . Cardiac catheterization  01/27/2009    ef 60%  . Coronary artery bypass graft  1996    LIMA GRAFT TO THE LAD, SAPHENOUS VEIN GRAFT SEQUENTIALLY TO THE FIRST AND SECOND OBTUSE MARGINAL VESSELS, SAPHENOUS VEIN GRAFT TO THE DIAGONAL, AND SAPHENOUS VEIN GRAFT TO THE DISTAL RIGHT CORONARY   . Laceration repair  1980's    LEFT HAND  . Foot surgery  1980's    LEFT, "shot a nail gun thru it" (09/03/2012)  . Cervical disc surgery  ? 1990's    "went in on the side" (09/03/2012)  . Cardiac catheterization  08/25/2012    Severe 3v  obstructive CAD, continued graft patency (SVG-D,  SVG-OM1-OM2, SVG-PDA, LIMA-LAD). area of diffuse dz in the distal LCx (up to 90%) unamenable to PCI    Prior to Admission medications   Medication Sig Start Date End Date Taking? Authorizing Provider  aspirin EC 81 MG tablet Take 81 mg by mouth daily.   Yes Historical Provider, MD  Insulin Glargine (LANTUS SOLOSTAR) 100 UNIT/ML Solostar Pen Inject 20 Units into the skin every morning.   Yes Historical Provider, MD  Insulin Pen Needle (BD PEN NEEDLE NANO U/F) 32G X 4 MM MISC 1 pen by Does not apply route daily. 11/11/13  Yes Stacy Gardner, PA-C  losartan-hydrochlorothiazide (HYZAAR) 100-25 MG per tablet Take 1 tablet by mouth daily.   Yes Historical Provider, MD  metoprolol (LOPRESSOR) 50 MG tablet Take 1 tablet (50 mg total) by mouth 2 (two) times daily. 02/02/13  Yes Rogelia Mire, NP  Multiple Vitamin (MULTIVITAMIN WITH MINERALS) TABS Take 1 tablet by mouth daily.   Yes Historical Provider, MD  nitroGLYCERIN (NITROSTAT) 0.4 MG SL tablet Place 1 tablet (0.4 mg total) under the tongue every 5 (five) minutes x 3 doses as needed for chest pain. 02/02/13  Yes Rogelia Mire, NP  simvastatin (ZOCOR) 80 MG tablet Take 0.5 tablets (40 mg total) by mouth every morning. 09/22/13  Yes Peter M Martinique, MD  tadalafil (CIALIS) 20 MG tablet Take 20 mg by mouth daily  as needed for erectile dysfunction.   Yes Historical Provider, MD  traMADol (ULTRAM) 50 MG tablet Take 1 tablet (50 mg total) by mouth every 6 (six) hours as needed for pain. 06/18/13  Yes Peter M Martinique, MD  prasugrel (EFFIENT) 10 MG TABS tablet Take 1 tablet (10 mg total) by mouth daily. 06/02/13   Peter M Martinique, MD    Current Facility-Administered Medications  Medication Dose Route Frequency Provider Last Rate Last Dose  . 0.9 %  sodium chloride infusion  250 mL Intravenous PRN Toy Baker, MD      . acetaminophen (TYLENOL) tablet 650 mg  650 mg Oral Q4H PRN Toy Baker, MD    650 mg at 11/18/13 0756  . aspirin EC tablet 81 mg  81 mg Oral Daily Toy Baker, MD   81 mg at 11/18/13 0955  . atorvastatin (LIPITOR) tablet 40 mg  40 mg Oral q1800 Toy Baker, MD      . insulin aspart (novoLOG) injection 0-9 Units  0-9 Units Subcutaneous Q4H Toy Baker, MD   1 Units at 11/18/13 0018  . metoprolol (LOPRESSOR) tablet 50 mg  50 mg Oral BID Toy Baker, MD   50 mg at 11/18/13 0955  . nitroGLYCERIN (NITROSTAT) SL tablet 0.4 mg  0.4 mg Sublingual Q5 min PRN Belkys A Regalado, MD      . ondansetron (ZOFRAN) injection 4 mg  4 mg Intravenous Q6H PRN Toy Baker, MD      . pantoprazole (PROTONIX) injection 40 mg  40 mg Intravenous Q12H Belkys A Regalado, MD   40 mg at 11/18/13 0956  . sodium chloride 0.9 % injection 3 mL  3 mL Intravenous Q12H Toy Baker, MD   3 mL at 11/18/13 1004  . sodium chloride 0.9 % injection 3 mL  3 mL Intravenous PRN Toy Baker, MD      . traMADol (ULTRAM) tablet 50 mg  50 mg Oral Q6H PRN Toy Baker, MD        Allergies as of 11/17/2013 - Review Complete 11/17/2013  Allergen Reaction Noted  . Demerol Other (See Comments) 06/21/2011  . Meperidine and related  11/11/2013    Family History  Problem Relation Age of Onset  . Diabetes Other     2 siblings, 1 of whom is deceased  . Hypertension Other   . Cancer Other     Lung Cancer  . Cancer Other     FH of Prostate Cancer 1st degree relative  . Cancer Mother     breast  . Hypertension Mother   . Hypertension Father     History   Social History  . Marital Status: Married    Spouse Name: N/A    Number of Children: N/A  . Years of Education: N/A   Occupational History  . 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 Smoking status: Former Smoker -- 1.00 packs/day for 37 years    Types: Cigarettes    Quit date: 10/14/2002  . Smokeless tobacco: Never Used  . Alcohol Use: No  . Drug Use: No  . Sexual Activity: Yes    Other Topics Concern  . Not on file   Social History Narrative   Regular exercise-yes    Review of Systems:  ROS [place] 11/17/13 reviewed and I agree  Physical Exam: Vital signs in last 24 hours: Temp:  [98.6 F (37 C)-98.8 F (37.1 C)] 98.8 F (37.1 C) (02/05 0847) Pulse Rate:  [  60-71] 64 (02/05 0948) Resp:  [12-22] 14 (02/05 0847) BP: (102-139)/(58-94) 123/80 mmHg (02/05 0948) SpO2:  [96 %-100 %] 98 % (02/05 0847) Weight:  [78.4 kg (172 lb 13.5 oz)] 78.4 kg (172 lb 13.5 oz) (02/04 1606) Last BM Date: 11/17/13 General:   Alert,  Well-developed, well-nourished, pleasant and cooperative in NAD Head:  Normocephalic and atraumatic. Eyes:  Sclera clear, no icterus.   Conjunctiva pink. Ears:  Normal auditory acuity. Nose:  No deformity, discharge,  or lesions. Mouth:  No deformity or lesions.  Oropharynx pink & moist. Neck:  Supple; no masses or thyromegaly. Lungs:  Old midline sternotomy scar; Clear throughout to auscultation.   No wheezes, crackles, or rhonchi. No acute distress. Heart:  Regular rate and rhythm; no murmurs, clicks, rubs,  or gallops. Abdomen:  Soft, nontender and nondistended. No masses, hepatosplenomegaly or hernias noted. Normal bowel sounds, without guarding, and without rebound.     Msk:  Symmetrical without gross deformities. Normal posture. Pulses:  Normal pulses noted. Extremities:  Without clubbing or edema. Neurologic:  Alert and  oriented x4;  grossly normal neurologically. Skin:  Intact without significant lesions or rashes. Psych:  Alert and cooperative. Normal mood and affect.  Lab Results:  Recent Labs  11/17/13 1608 11/17/13 2232 11/18/13 0610  WBC 5.1 5.0 4.7  HGB 12.9* 12.1* 12.3*  HCT 37.7* 35.8* 36.1*  PLT 138* 120* 130*   BMET  Recent Labs  11/17/13 1608 11/18/13 0610  NA 144 143  K 3.8 3.7  CL 106 107  CO2 27 25  GLUCOSE 186* 72  BUN 18 16  CREATININE 1.29 1.22  CALCIUM 9.0 8.4   LFT No results found for this  basename: PROT, ALBUMIN, AST, ALT, ALKPHOS, BILITOT, BILIDIR, IBILI,  in the last 72 hours PT/INR  Recent Labs  11/18/13 0610  LABPROT 13.2  INR 1.02    Studies/Results: Dg Chest 2 View  11/17/2013   CLINICAL DATA:  Left chest pain/pressure  EXAM: CHEST  2 VIEW  COMPARISON:  02/01/2013  FINDINGS: Lungs are clear. No pleural effusion or pneumothorax.  The heart is normal in size. Postsurgical changes related to prior CABG.  Mild degenerative changes of the visualized thoracolumbar spine. Cervical spine fixation hardware.  IMPRESSION: No evidence of acute cardiopulmonary disease.   Electronically Signed   By: Julian Hy M.D.   On: 11/17/2013 17:28   Impression:  1.  Melena, in setting of Effient and recent intake of Goody's powders.  No rampant or hemodynamically unstable bleeding at this time. 2.  Anemia, mild, likely blood loss mediated. 3.  Chest pain with known cardiac disease, followed by cardiology.  Plan:  1.  Clear liquid diet, NPO after midnight. 2.  PPI. 3.  Continue to hold Effient. 4.  Endoscopy tomorrow. 5.  Risks (bleeding, infection, bowel perforation that could require surgery, sedation-related changes in cardiopulmonary systems), benefits (identification and possible treatment of source of symptoms, exclusion of certain causes of symptoms), and alternatives (watchful waiting, radiographic imaging studies, empiric medical treatment) of upper endoscopy (EGD) were explained to patient/family in detail and patient wishes to proceed.   LOS: 1 day   Donasia Wimes M  11/18/2013, 10:26 AM

## 2013-11-19 NOTE — Progress Notes (Signed)
[facility] PROGRESS NOTE  Antonio [name] PJA:[id number] DOB: 1946-09-15 DOA: 11/17/2013 PCP: Stacy Gardner, PA-C  Assessment/Plan: 1-Chest pain: Angina ? Marland Kitchen Resolved with Nitroglycerin gtt. Troponin times 2 negative.  Order PRN nitroglycerin. On metoprolol, Lipitor. Cath on Monday.   2-GI bleed, melena; could be multifactorial hemorrhoids but also patient relates melena, takes NSAID. Hb stable at 12. Hb last month at 16. Continue with protonix. Endoscopy no source of bleeding or that could explain melena. Ok to resume effient per GI. Hb stable.   3-Diabetes; Hold Lantus due to NPO status. SSI.  4-Thrombocytopenia; monitor.  Improving.  5-DVT prophylaxis: SCD>   Code Status: Full Code.  Family Communication: Care discussed with patient.  Disposition Plan: to be determine.    Consultants:  Cardio  GI  Procedures:  none  Antibiotics:  none  HPI/Subjective: No chest pain. Eating lunch. No more episode of bleeding.   Objective: Filed Vitals:   11/19/13 1030  BP: 143/103  Pulse: 65  Temp:   Resp: 13    Intake/Output Summary (Last 24 hours) at 11/19/13 1334 Last data filed at 11/19/13 1000  Gross per 24 hour  Intake    300 ml  Output    200 ml  Net    100 ml   Filed Weights   11/17/13 1606 11/19/13 0500  Weight: 78.4 kg (172 lb 13.5 oz) 80.105 kg (176 lb 9.6 oz)    Exam:   General:  No distress.   Cardiovascular: S 1, S 2 RRR  Respiratory: CTA  Abdomen: BS present, soft. Nt  Musculoskeletal: no edema.   Data Reviewed: Basic Metabolic Panel:  Recent Labs Lab 11/17/13 1608 11/18/13 0610 11/19/13 0530  NA 144 143 143  K 3.8 3.7 4.0  CL 106 107 107  CO2 27 25 25   GLUCOSE 186* 72 95  BUN 18 16 11   CREATININE 1.29 1.22 1.20  CALCIUM 9.0 8.4 8.5  MG  --  1.7  --    Liver Function Tests: No results found for this basename: AST, ALT, ALKPHOS, BILITOT, PROT, ALBUMIN,  in the last 168 hours No results found for this basename: LIPASE,  AMYLASE,  in the last 168 hours No results found for this basename: AMMONIA,  in the last 168 hours CBC:  Recent Labs Lab 11/17/13 2232 11/18/13 0610 11/18/13 1320 11/18/13 2233 11/19/13 0530  WBC 5.0 4.7 5.2 4.5 4.7  HGB 12.1* 12.3* 12.7* 12.9* 13.0  HCT 35.8* 36.1* 36.5* 37.3* 37.7*  MCV 87.5 87.4 87.3 87.1 87.1  PLT 120* 130* 128* 131* 132*   Cardiac Enzymes:  Recent Labs Lab 11/17/13 2232 11/18/13 0610 11/18/13 0845  TROPONINI <0.30 <0.30 <0.30   BNP (last 3 results)  Recent Labs  11/17/13 1608  PROBNP 219.5*   CBG:  Recent Labs Lab 11/18/13 2037 11/19/13 0030 11/19/13 0512 11/19/13 0730 11/19/13 1129  GLUCAP 127* 105* 84 111* 106*    No results found for this or any previous visit (from the past 240 hour(s)).   Studies: Dg Chest 2 View  11/17/2013   CLINICAL DATA:  Left chest pain/pressure  EXAM: CHEST  2 VIEW  COMPARISON:  02/01/2013  FINDINGS: Lungs are clear. No pleural effusion or pneumothorax.  The heart is normal in size. Postsurgical changes related to prior CABG.  Mild degenerative changes of the visualized thoracolumbar spine. Cervical spine fixation hardware.  IMPRESSION: No evidence of acute cardiopulmonary disease.   Electronically Signed   By: Julian Hy M.D.   On: 11/17/2013  17:28    Scheduled Meds: . amLODipine  5 mg Oral Daily  . aspirin EC  81 mg Oral Daily  . atorvastatin  40 mg Oral q1800  . insulin aspart  0-9 Units Subcutaneous Q4H  . metoprolol  50 mg Oral BID  . pantoprazole (PROTONIX) IV  40 mg Intravenous Q12H  . prasugrel  10 mg Oral Daily  . sodium chloride  3 mL Intravenous Q12H   Continuous Infusions: . sodium chloride    . [START ON 11/20/2013] sodium chloride      Principal Problem:   Chest pain Active Problems:   DM   HYPERTENSION   BRBPR (bright red blood per rectum)   Lower GI bleeding   CAD (coronary artery disease) of artery bypass graft   Midsternal chest pain    Time spent: 35 minutes,  coordinating care.     Sekou Zuckerman  [facility] Pager [phone]. If 7PM-7AM, please contact night-coverage at [email or web address], password [facility] 11/19/2013, 1:34 PM  LOS: 2 days

## 2013-11-19 NOTE — Progress Notes (Signed)
Pt c/o of 2 episodes of cp/discomfort 4/10 radiating down LA around 2300. Each episode lasting about 1-2 seconds. EKG unchanged. Pt refused nitro stating it wouldn't help d/t the episodes not lasting long. Pt told to call RN if any further episodes occurred. Will continue to monitor the pt and pass on to be addressed with MD during the day. Hoover Brunette, RN

## 2013-11-19 NOTE — Op Note (Signed)
[facility] [place], [address]   ENDOSCOPY PROCEDURE REPORT  PATIENT: Antonio, [name]  MR#: [id number] BIRTHDATE: 1945-11-23 , 70  yrs. old GENDER: Male ENDOSCOPIST: Arta Silence, MD REFERRED BY:  Fransico Him, M.D. PROCEDURE DATE:  11/19/2013 PROCEDURE:  EGD, diagnostic ASA CLASS:     Class III INDICATIONS:  anemia, melena. MEDICATIONS: MAC sedation, administered by CRNA TOPICAL ANESTHETIC: Cetacaine Spray  DESCRIPTION OF PROCEDURE: After the risks benefits and alternatives of the procedure were thoroughly explained, informed consent was obtained.  The Pentax Gastroscope [id number] endoscope was introduced through the mouth and advanced to the second portion of the duodenum. Without limitations.  The instrument was slowly withdrawn as the mucosa was fully examined.     Findings:  Normal esophagus.  Mild diffuse gastritis, otherwise normal stomach and pylorus.  Normal duodenum to the second portion. No old or fresh blood seen to the extent of our examination. The scope was then withdrawn from the patient and the procedure completed.  ENDOSCOPIC IMPRESSION:     As above.  No source of melena seen.   RECOMMENDATIONS:     1.  Watch for potential complications of procedure. 2.  Ok to restart Effient, as clinically indicated. 3.  Avoid/minimize NSAIDs. 4.  Patient had colonoscopy in April 2011, showing radiation proctitis and diverticulosis.  In absence of rebleeding, don't see need for repeat colonoscopy at the present time. 5.  OK to proceed with whatever cardiac evaluation is deemed necessary. 6.  Start diet. 7.  [facility] will sign-off; please call with questions; thank you for the consult.  eSigned:  Arta Silence, MD 11/19/2013 10:09 AM   CC:

## 2013-11-19 NOTE — Transfer of Care (Signed)
Immediate Anesthesia Transfer of Care Note  Patient: Antonio [name]  Procedure(s) Performed: Procedure(s): ESOPHAGOGASTRODUODENOSCOPY (EGD) (Left)  Patient Location: Endoscopy Unit  Anesthesia Type:MAC  Level of Consciousness: awake, alert  and oriented  Airway & Oxygen Therapy: Patient Spontanous Breathing and Patient connected to nasal cannula oxygen  Post-op Assessment: Report given to PACU RN, Post -op Vital signs reviewed and stable and Patient moving all extremities X 4  Post vital signs: Reviewed and stable  Complications: No apparent anesthesia complications

## 2013-11-19 NOTE — Anesthesia Preprocedure Evaluation (Addendum)
Anesthesia Evaluation  Patient identified by MRN, date of birth, ID band Patient awake    Reviewed: Allergy & Precautions, H&P , NPO status , Patient's Chart, lab work & pertinent test results  Airway Mallampati: II  Neck ROM: full    Dental  (+) Edentulous Upper and Edentulous Lower   Pulmonary former smoker,          Cardiovascular hypertension, + angina + CAD, + Past MI and + CABG  s/p CABG 1996 - last cath 08/2013 - patent grafts.   Neuro/Psych    GI/Hepatic GERD-  ,(+)     substance abuse  alcohol use,   Endo/Other  diabetes, Type 2  Renal/GU      Musculoskeletal   Abdominal   Peds  Hematology   Anesthesia Other Findings   Reproductive/Obstetrics                          Anesthesia Physical Anesthesia Plan  ASA: III  Anesthesia Plan: MAC   Post-op Pain Management:    Induction: Intravenous  Airway Management Planned: Nasal Cannula  Additional Equipment:   Intra-op Plan:   Post-operative Plan:   Informed Consent: I have reviewed the patients History and Physical, chart, labs and discussed the procedure including the risks, benefits and alternatives for the proposed anesthesia with the patient or authorized representative who has indicated his/her understanding and acceptance.     Plan Discussed with: CRNA, Anesthesiologist and Surgeon  Anesthesia Plan Comments:         Anesthesia Quick Evaluation

## 2013-11-19 NOTE — Progress Notes (Signed)
Subjective: RECURRENT CHEST PAIN, several episodes during the night, some brief shooting.  Objective: Vital signs in last 24 hours: Temp:  [98.3 F (36.8 C)-99.4 F (37.4 C)] 99.4 F (37.4 C) (02/06 0500) Pulse Rate:  [59-72] 68 (02/06 0500) Resp:  [14-18] 18 (02/05 2037) BP: (121-162)/(80-92) 132/82 mmHg (02/06 0500) SpO2:  [98 %-100 %] 98 % (02/06 0500) Weight:  [176 lb 9.6 oz (80.105 kg)] 176 lb 9.6 oz (80.105 kg) (02/06 0500) Weight change: 3 lb 12.2 oz (1.705 kg) Last BM Date: 11/18/13 Intake/Output from previous day: -195 02/05 0701 - 02/06 0700 In: 104.3 [I.V.:104.3] Out: 300 [Urine:300] Intake/Output this shift:    PE: General:Pleasant affect, NAD Skin:Warm and dry, brisk capillary refill HEENT:normocephalic, sclera clear, mucus membranes moist Heart:S1S2 RRR without murmur, gallup, rub or click Lungs:clear without rales, rhonchi, or wheezes ZHG:DJME, non tender, + BS, do not palpate liver spleen or masses Ext:no lower ext edema, 2+ pedal pulses, 2+ radial pulses Neuro:alert and oriented, MAE, follows commands, + facial symmetry   Lab Results:  Recent Labs  11/18/13 2233 11/19/13 0530  WBC 4.5 4.7  HGB 12.9* 13.0  HCT 37.3* 37.7*  PLT 131* 132*   BMET  Recent Labs  11/18/13 0610 11/19/13 0530  NA 143 143  K 3.7 4.0  CL 107 107  CO2 25 25  GLUCOSE 72 95  BUN 16 11  CREATININE 1.22 1.20  CALCIUM 8.4 8.5    Recent Labs  11/18/13 0610 11/18/13 0845  TROPONINI <0.30 <0.30    Lab Results  Component Value Date   CHOL 106 11/18/2013   HDL 31* 11/18/2013   LDLCALC 60 11/18/2013   TRIG 73 11/18/2013   CHOLHDL 3.4 11/18/2013   Lab Results  Component Value Date   HGBA1C 12.9* 11/17/2013     Lab Results  Component Value Date   TSH 1.097 11/17/2013    Hepatic Function Panel No results found for this basename: PROT, ALBUMIN, AST, ALT, ALKPHOS, BILITOT, BILIDIR, IBILI,  in the last 72 hours  Recent Labs  11/18/13 0610  CHOL 106   No  results found for this basename: PROTIME,  in the last 72 hours     Studies/Results: Dg Chest 2 View  11/17/2013   CLINICAL DATA:  Left chest pain/pressure  EXAM: CHEST  2 VIEW  COMPARISON:  02/01/2013  FINDINGS: Lungs are clear. No pleural effusion or pneumothorax.  The heart is normal in size. Postsurgical changes related to prior CABG.  Mild degenerative changes of the visualized thoracolumbar spine. Cervical spine fixation hardware.  IMPRESSION: No evidence of acute cardiopulmonary disease.   Electronically Signed   By: Julian Hy M.D.   On: 11/17/2013 17:28   2D Echo: Left ventricle: The cavity size was normal. There was mild concentric hypertrophy. Systolic function was normal. The estimated ejection fraction was in the range of 50% to 55%. Wall motion was normal; there were no regional wall motion abnormalities. Doppler parameters are consistent with abnormal left ventricular relaxation (grade 1 diastolic dysfunction). - Mitral valve: Mild regurgitation. - Left atrium: The atrium was mildly dilated. Volume: 70.70ml (S). - Pulmonary arteries: Systolic pressure was mildly increased  Medications: I have reviewed the patient's current medications. Scheduled Meds: . aspirin EC  81 mg Oral Daily  . atorvastatin  40 mg Oral q1800  . insulin aspart  0-9 Units Subcutaneous Q4H  . metoprolol  50 mg Oral BID  . pantoprazole (PROTONIX) IV  40 mg  Intravenous Q12H  . sodium chloride  3 mL Intravenous Q12H   Continuous Infusions:  PRN Meds:.sodium chloride, acetaminophen, nitroGLYCERIN, ondansetron (ZOFRAN) IV, sodium chloride, traMADol  Assessment/Plan: Principal Problem:   Chest pain Active Problems:   DM   HYPERTENSION   BRBPR (bright red blood per rectum)   Lower GI bleeding   CAD (coronary artery disease) of artery bypass graft   Midsternal chest pain  Plan:neg. MI, echo stable.  For EGD today and cath on Monday.  NTG IV was stopped for headache.  Has headache this AM  but improving, but doubt he could tolerate po Imdur .  Episodic chest discomfort.  BP elevated low dose norvasc to improve BP and help with angina. Glucose well controlled. Will add cath orders after EGD.  LOS: 2 days   Time spent with pt. :15 minutes. [facility] R  Nurse Practitioner Certified Pager [phone] or after 5pm and on weekends call [phone] 11/19/2013, 7:46 AM

## 2013-11-19 NOTE — Progress Notes (Signed)
2/6 Spoke with patient.  Was diagnosed 3 years ago. Had been on Metformin, but had started Lantus 20 units daily about a week ago.  HgbA1C is 12.9%. Has been keeping records of blood sugars.  Would like to see a dietician, will order Living Well with Diabetes, and  would like to watch videos. Will order consult for dietician.  Will continue to follow while in hospital.  Harvel Ricks RN BSN CDE

## 2013-11-19 NOTE — Interval H&P Note (Signed)
History and Physical Interval Note:  11/19/2013 9:32 AM  Antonio [name]  has presented today for surgery, with the diagnosis of melena, anemia  The various methods of treatment have been discussed with the patient and family. After consideration of risks, benefits and other options for treatment, the patient has consented to  Procedure(s): ESOPHAGOGASTRODUODENOSCOPY (EGD) (Left) as a surgical intervention .  The patient's history has been reviewed, patient examined, no change in status, stable for surgery.  I have reviewed the patient's chart and labs.  Questions were answered to the patient's satisfaction.     Antonio [name]  Assessment:  1.  Melena. 2.  Anemia.  Plan:  1.  Endoscopy. 2.  Risks (bleeding, infection, bowel perforation that could require surgery, sedation-related changes in cardiopulmonary systems), benefits (identification and possible treatment of source of symptoms, exclusion of certain causes of symptoms), and alternatives (watchful waiting, radiographic imaging studies, empiric medical treatment) of upper endoscopy (EGD) were explained to patient/family in detail and patient wishes to proceed.

## 2013-11-19 NOTE — Progress Notes (Signed)
Patient seen, interviewed and examined and agree with note by Cecilie Kicks NP.  He just had EGD done with no source of bleeding and mild gastritis.  Per GI, ok to restart Effient.  Will proceed with heart cath on Monday.  Continue ASA/Effient/statin/beta blocker/amlodipine.

## 2013-11-20 DIAGNOSIS — R079 Chest pain, unspecified: Secondary | ICD-10-CM

## 2013-11-20 LAB — CBC
HEMATOCRIT: 37.3 % — AB (ref 39.0–52.0)
HEMOGLOBIN: 12.8 g/dL — AB (ref 13.0–17.0)
MCH: 30 pg (ref 26.0–34.0)
MCHC: 34.3 g/dL (ref 30.0–36.0)
MCV: 87.4 fL (ref 78.0–100.0)
Platelets: 131 10*3/uL — ABNORMAL LOW (ref 150–400)
RBC: 4.27 MIL/uL (ref 4.22–5.81)
RDW: 14.9 % (ref 11.5–15.5)
WBC: 3.9 10*3/uL — ABNORMAL LOW (ref 4.0–10.5)

## 2013-11-20 LAB — GLUCOSE, CAPILLARY
GLUCOSE-CAPILLARY: 156 mg/dL — AB (ref 70–99)
GLUCOSE-CAPILLARY: 130 mg/dL — AB (ref 70–99)
GLUCOSE-CAPILLARY: 150 mg/dL — AB (ref 70–99)
Glucose-Capillary: 133 mg/dL — ABNORMAL HIGH (ref 70–99)
Glucose-Capillary: 167 mg/dL — ABNORMAL HIGH (ref 70–99)
Glucose-Capillary: 238 mg/dL — ABNORMAL HIGH (ref 70–99)

## 2013-11-20 LAB — BASIC METABOLIC PANEL
BUN: 12 mg/dL (ref 6–23)
CO2: 21 meq/L (ref 19–32)
Calcium: 8.4 mg/dL (ref 8.4–10.5)
Chloride: 108 mEq/L (ref 96–112)
Creatinine, Ser: 1.17 mg/dL (ref 0.50–1.35)
GFR calc Af Amer: 74 mL/min — ABNORMAL LOW (ref >90)
GFR, EST NON AFRICAN AMERICAN: 64 mL/min — AB (ref >90)
Glucose, Bld: 138 mg/dL — ABNORMAL HIGH (ref 70–99)
POTASSIUM: 4.2 meq/L (ref 3.7–5.3)
SODIUM: 141 meq/L (ref 137–147)

## 2013-11-20 MED ORDER — DOCUSATE SODIUM 100 MG PO CAPS
100.0000 mg | ORAL_CAPSULE | Freq: Two times a day (BID) | ORAL | Status: DC
  Administered 2013-11-20 – 2013-11-22 (×5): 100 mg via ORAL
  Filled 2013-11-20 (×6): qty 1

## 2013-11-20 MED ORDER — SALINE SPRAY 0.65 % NA SOLN
1.0000 | NASAL | Status: DC | PRN
  Administered 2013-11-20: 1 via NASAL
  Filled 2013-11-20: qty 44

## 2013-11-20 MED ORDER — PANTOPRAZOLE SODIUM 40 MG PO TBEC
40.0000 mg | DELAYED_RELEASE_TABLET | Freq: Two times a day (BID) | ORAL | Status: DC
  Administered 2013-11-20 – 2013-11-22 (×5): 40 mg via ORAL
  Filled 2013-11-20 (×4): qty 1

## 2013-11-20 NOTE — Progress Notes (Signed)
[facility] PROGRESS NOTE  Antonio [name] GQQ:[id number] DOB: 1946/01/22 DOA: 11/17/2013 PCP: Stacy Gardner, PA-C  Assessment/Plan: 1-Chest pain: Angina ? Marland Kitchen Resolved with Nitroglycerin gtt. Troponin times 2 negative.  Order PRN nitroglycerin. On metoprolol, Lipitor. Cath on Monday.   2-GI bleed, melena; Hb stable at 12. Hb last month at 16. Continue with protonix. Endoscopy no source of bleeding or that could explain melena. Ok to resume effient per GI.   3-Diabetes; SSI. lantus on hold.  4-Thrombocytopenia; monitor.  Improving.  5-DVT prophylaxis: SCD>   Code Status: Full Code.  Family Communication: Care discussed with patient.  Disposition Plan: to be determine.    Consultants:  Cardio  GI  Procedures:  none  Antibiotics:  none  HPI/Subjective: No chest pain.  No more episode of bleeding.   Objective: Filed Vitals:   11/20/13 1425  BP: 126/81  Pulse: 64  Temp: 98 F (36.7 C)  Resp: 26    Intake/Output Summary (Last 24 hours) at 11/20/13 1612 Last data filed at 11/20/13 0615  Gross per 24 hour  Intake      0 ml  Output   1075 ml  Net  -1075 ml   Filed Weights   11/17/13 1606 11/19/13 0500  Weight: 78.4 kg (172 lb 13.5 oz) 80.105 kg (176 lb 9.6 oz)    Exam:   General:  No distress.   Cardiovascular: S 1, S 2 RRR  Respiratory: CTA  Abdomen: BS present, soft. Nt  Musculoskeletal: no edema.   Data Reviewed: Basic Metabolic Panel:  Recent Labs Lab 11/17/13 1608 11/18/13 0610 11/19/13 0530 11/20/13 0415  NA 144 143 143 141  K 3.8 3.7 4.0 4.2  CL 106 107 107 108  CO2 27 25 25 21   GLUCOSE 186* 72 95 138*  BUN 18 16 11 12   CREATININE 1.29 1.22 1.20 1.17  CALCIUM 9.0 8.4 8.5 8.4  MG  --  1.7  --   --    Liver Function Tests: No results found for this basename: AST, ALT, ALKPHOS, BILITOT, PROT, ALBUMIN,  in the last 168 hours No results found for this basename: LIPASE, AMYLASE,  in the last 168 hours No results found for this  basename: AMMONIA,  in the last 168 hours CBC:  Recent Labs Lab 11/18/13 0610 11/18/13 1320 11/18/13 2233 11/19/13 0530 11/20/13 0415  WBC 4.7 5.2 4.5 4.7 3.9*  HGB 12.3* 12.7* 12.9* 13.0 12.8*  HCT 36.1* 36.5* 37.3* 37.7* 37.3*  MCV 87.4 87.3 87.1 87.1 87.4  PLT 130* 128* 131* 132* 131*   Cardiac Enzymes:  Recent Labs Lab 11/17/13 2232 11/18/13 0610 11/18/13 0845  TROPONINI <0.30 <0.30 <0.30   BNP (last 3 results)  Recent Labs  11/17/13 1608  PROBNP 219.5*   CBG:  Recent Labs Lab 11/19/13 1959 11/20/13 0003 11/20/13 0357 11/20/13 0844 11/20/13 1208  GLUCAP 89 167* 156* 238* 133*    No results found for this or any previous visit (from the past 240 hour(s)).   Studies: No results found.  Scheduled Meds: . amLODipine  5 mg Oral Daily  . aspirin EC  81 mg Oral Daily  . atorvastatin  40 mg Oral q1800  . docusate sodium  100 mg Oral BID  . insulin aspart  0-9 Units Subcutaneous Q4H  . living well with diabetes book   Does not apply Once  . metoprolol  50 mg Oral BID  . pantoprazole  40 mg Oral BID  . prasugrel  10 mg Oral Daily  .  sodium chloride  3 mL Intravenous Q12H   Continuous Infusions: . sodium chloride    . sodium chloride      Principal Problem:   Chest pain Active Problems:   DM   HYPERTENSION   BRBPR (bright red blood per rectum)   Lower GI bleeding   CAD (coronary artery disease) of artery bypass graft   Midsternal chest pain    Time spent: 25 minutes, coordinating care.     Antonio [name]  [facility] Pager [phone]. If 7PM-7AM, please contact night-coverage at [email or web address], password [facility] 11/20/2013, 4:12 PM  LOS: 3 days

## 2013-11-20 NOTE — Progress Notes (Signed)
RD consulted for diet education regarding diabetes. RD visited pt earlier today and he requests for RD to return later this afternoon when his wife arrives. RD returned to visit pt at 4:30 PM and pt states his wife has been delayed. He would like to re-schedule education for Monday. RD to follow-up Monday.   Pryor Ochoa RD, LDN After Hours Pager: [phone]

## 2013-11-20 NOTE — Progress Notes (Signed)
Patient ID: Antonio [name], male   DOB: 1946/04/03, 68 y.o.   MRN: [id number]         Subjective: NO pain ambulating on floor   Objective: Vital signs in last 24 hours: Temp:  [97.4 F (36.3 C)-98.9 F (37.2 C)] 97.4 F (36.3 C) (02/07 0420) Pulse Rate:  [58-72] 64 (02/07 0420) Resp:  [10-17] 17 (02/07 0420) BP: (119-147)/(74-104) 146/87 mmHg (02/07 0420) SpO2:  [96 %-99 %] 98 % (02/07 0420) Weight change:  Last BM Date: 11/19/13 Intake/Output from previous day: +720 02/06 0701 - 02/07 0700 In: 300 [I.V.:300] Out: 1075 [Urine:1075] Intake/Output this shift:    PE: General:Pleasant affect, NAD Skin:Warm and dry, brisk capillary refill HEENT:normocephalic, sclera clear, mucus membranes moist Neck:supple, no JVD  Heart:S1S2 RRR without murmur, gallup, rub or click Lungs:clear without rales, rhonchi, or wheezes VQX:IHWT, non tender, + BS, do not palpate liver spleen or masses Ext:no lower ext edema, 2+ pedal pulses, 2+ radial pulses Neuro:alert and oriented, MAE, follows commands, + facial symmetry   EKG SR without acute changes.  Lab Results:  Recent Labs  11/19/13 0530 11/20/13 0415  WBC 4.7 3.9*  HGB 13.0 12.8*  HCT 37.7* 37.3*  PLT 132* 131*   BMET  Recent Labs  11/19/13 0530 11/20/13 0415  NA 143 141  K 4.0 4.2  CL 107 108  CO2 25 21  GLUCOSE 95 138*  BUN 11 12  CREATININE 1.20 1.17  CALCIUM 8.5 8.4    Recent Labs  11/18/13 0610 11/18/13 0845  TROPONINI <0.30 <0.30    Lab Results  Component Value Date   CHOL 106 11/18/2013   HDL 31* 11/18/2013   LDLCALC 60 11/18/2013   TRIG 73 11/18/2013   CHOLHDL 3.4 11/18/2013   Lab Results  Component Value Date   HGBA1C 12.9* 11/17/2013     Lab Results  Component Value Date   TSH 1.097 11/17/2013    Hepatic Function Panel No results found for this basename: PROT, ALBUMIN, AST, ALT, ALKPHOS, BILITOT, BILIDIR, IBILI,  in the last 72 hours  Recent Labs  11/18/13 0610  CHOL 106   No results found  for this basename: PROTIME,  in the last 72 hours     Studies/Results: No results found.  Medications: I have reviewed the patient's current medications. Scheduled Meds: . amLODipine  5 mg Oral Daily  . aspirin EC  81 mg Oral Daily  . atorvastatin  40 mg Oral q1800  . insulin aspart  0-9 Units Subcutaneous Q4H  . living well with diabetes book   Does not apply Once  . metoprolol  50 mg Oral BID  . pantoprazole  40 mg Oral BID  . prasugrel  10 mg Oral Daily  . sodium chloride  3 mL Intravenous Q12H   Continuous Infusions: . sodium chloride    . sodium chloride     PRN Meds:.sodium chloride, acetaminophen, morphine injection, nitroGLYCERIN, ondansetron (ZOFRAN) IV, sodium chloride, traMADol  Assessment/Plan: Principal Problem:   Chest pain Active Problems:   DM   HYPERTENSION   BRBPR (bright red blood per rectum)   Lower GI bleeding   CAD (coronary artery disease) of artery bypass graft   Midsternal chest pain  Chest Pain:  See note by Dr Radford Pax Cath planned for Monday R/O  Ok to be on DAT per GI Chol:  Continue statin  GERD/GI:  Continue pantoprazole  Given need for stomach med using Effient

## 2013-11-21 LAB — PROTIME-INR
INR: 0.97 (ref 0.00–1.49)
Prothrombin Time: 12.7 seconds (ref 11.6–15.2)

## 2013-11-21 LAB — GLUCOSE, CAPILLARY
GLUCOSE-CAPILLARY: 137 mg/dL — AB (ref 70–99)
GLUCOSE-CAPILLARY: 202 mg/dL — AB (ref 70–99)
Glucose-Capillary: 190 mg/dL — ABNORMAL HIGH (ref 70–99)
Glucose-Capillary: 160 mg/dL — ABNORMAL HIGH (ref 70–99)

## 2013-11-21 LAB — PLATELET INHIBITION P2Y12: Platelet Function  P2Y12: 68 [PRU] — ABNORMAL LOW (ref 194–418)

## 2013-11-21 LAB — BASIC METABOLIC PANEL
BUN: 12 mg/dL (ref 6–23)
CALCIUM: 8.4 mg/dL (ref 8.4–10.5)
CO2: 21 mEq/L (ref 19–32)
CREATININE: 1.08 mg/dL (ref 0.50–1.35)
Chloride: 103 mEq/L (ref 96–112)
GFR calc non Af Amer: 70 mL/min — ABNORMAL LOW (ref >90)
GFR, EST AFRICAN AMERICAN: 81 mL/min — AB (ref >90)
Glucose, Bld: 314 mg/dL — ABNORMAL HIGH (ref 70–99)
Potassium: 4.2 mEq/L (ref 3.7–5.3)
Sodium: 138 mEq/L (ref 137–147)

## 2013-11-21 LAB — CBC
HCT: 38.3 % — ABNORMAL LOW (ref 39.0–52.0)
Hemoglobin: 13.3 g/dL (ref 13.0–17.0)
MCH: 30.2 pg (ref 26.0–34.0)
MCHC: 34.7 g/dL (ref 30.0–36.0)
MCV: 86.8 fL (ref 78.0–100.0)
PLATELETS: 132 10*3/uL — AB (ref 150–400)
RBC: 4.41 MIL/uL (ref 4.22–5.81)
RDW: 14.6 % (ref 11.5–15.5)
WBC: 3.8 10*3/uL — ABNORMAL LOW (ref 4.0–10.5)

## 2013-11-21 MED ORDER — ASPIRIN 81 MG PO CHEW
81.0000 mg | CHEWABLE_TABLET | ORAL | Status: AC
  Administered 2013-11-22: 81 mg via ORAL
  Filled 2013-11-21: qty 1

## 2013-11-21 MED ORDER — SODIUM CHLORIDE 0.9 % IV SOLN: 250.0000 mL | INTRAVENOUS | Status: DC | PRN

## 2013-11-21 MED ORDER — SODIUM CHLORIDE 0.9 % IJ SOLN: 3.0000 mL | INTRAMUSCULAR | Status: DC | PRN

## 2013-11-21 MED ORDER — SODIUM CHLORIDE 0.9 % IJ SOLN
3.0000 mL | Freq: Two times a day (BID) | INTRAMUSCULAR | Status: DC
  Administered 2013-11-21: 3 mL via INTRAVENOUS

## 2013-11-21 NOTE — Progress Notes (Signed)
[facility] PROGRESS NOTE  Antonio [name] QPY:[id number] DOB: 12/29/1945 DOA: 11/17/2013 PCP: Stacy Gardner, PA-C  Assessment/Plan: 1-Chest pain: Angina ? Marland Kitchen Resolved with Nitroglycerin gtt. Troponin times 3 negative.  Order PRN nitroglycerin. On metoprolol, Lipitor. Cath on Monday.   2-GI bleed, melena; Hb stable at 12. Hb last month at 16. Continue with protonix. Endoscopy no source of bleeding or that could explain melena. Ok to resume effient per GI.   3-Diabetes; SSI. lantus on hold.  4-Thrombocytopenia; monitor.  Improving.  5-DVT prophylaxis: SCD>  6-Leukopenia; check HIV.   Code Status: Full Code.  Family Communication: Care discussed with patient.  Disposition Plan: to be determine.    Consultants:  Cardio  GI  Procedures:  none  Antibiotics:  none  HPI/Subjective: No chest pain.  No more episode of bleeding.   Objective: Filed Vitals:   11/21/13 1009  BP:   Pulse: 62  Temp:   Resp:     Intake/Output Summary (Last 24 hours) at 11/21/13 1205 Last data filed at 11/21/13 0500  Gross per 24 hour  Intake    360 ml  Output    950 ml  Net   -590 ml   Filed Weights   11/17/13 1606 11/19/13 0500 11/21/13 1100  Weight: 78.4 kg (172 lb 13.5 oz) 80.105 kg (176 lb 9.6 oz) 82.4 kg (181 lb 10.5 oz)    Exam:   General:  No distress.   Cardiovascular: S 1, S 2 RRR  Respiratory: CTA  Abdomen: BS present, soft. Nt  Musculoskeletal: no edema.   Data Reviewed: Basic Metabolic Panel:  Recent Labs Lab 11/17/13 1608 11/18/13 0610 11/19/13 0530 11/20/13 0415 11/21/13 1000  NA 144 143 143 141 138  K 3.8 3.7 4.0 4.2 4.2  CL 106 107 107 108 103  CO2 27 25 25 21 21   GLUCOSE 186* 72 95 138* 314*  BUN 18 16 11 12 12   CREATININE 1.29 1.22 1.20 1.17 1.08  CALCIUM 9.0 8.4 8.5 8.4 8.4  MG  --  1.7  --   --   --    Liver Function Tests: No results found for this basename: AST, ALT, ALKPHOS, BILITOT, PROT, ALBUMIN,  in the last 168 hours No  results found for this basename: LIPASE, AMYLASE,  in the last 168 hours No results found for this basename: AMMONIA,  in the last 168 hours CBC:  Recent Labs Lab 11/18/13 1320 11/18/13 2233 11/19/13 0530 11/20/13 0415 11/21/13 1000  WBC 5.2 4.5 4.7 3.9* 3.8*  HGB 12.7* 12.9* 13.0 12.8* 13.3  HCT 36.5* 37.3* 37.7* 37.3* 38.3*  MCV 87.3 87.1 87.1 87.4 86.8  PLT 128* 131* 132* 131* 132*   Cardiac Enzymes:  Recent Labs Lab 11/17/13 2232 11/18/13 0610 11/18/13 0845  TROPONINI <0.30 <0.30 <0.30   BNP (last 3 results)  Recent Labs  11/17/13 1608  PROBNP 219.5*   CBG:  Recent Labs Lab 11/20/13 0844 11/20/13 1208 11/20/13 1701 11/20/13 2121 11/21/13 0730  GLUCAP 238* 133* 130* 150* 137*    No results found for this or any previous visit (from the past 240 hour(s)).   Studies: No results found.  Scheduled Meds: . amLODipine  5 mg Oral Daily  . [START ON 11/22/2013] aspirin  81 mg Oral Pre-Cath  . aspirin EC  81 mg Oral Daily  . atorvastatin  40 mg Oral q1800  . docusate sodium  100 mg Oral BID  . insulin aspart  0-9 Units Subcutaneous Q4H  . living well  with diabetes book   Does not apply Once  . metoprolol  50 mg Oral BID  . pantoprazole  40 mg Oral BID  . prasugrel  10 mg Oral Daily  . sodium chloride  3 mL Intravenous Q12H  . sodium chloride  3 mL Intravenous Q12H   Continuous Infusions: . sodium chloride    . sodium chloride      Principal Problem:   Chest pain Active Problems:   DM   HYPERTENSION   BRBPR (bright red blood per rectum)   Lower GI bleeding   CAD (coronary artery disease) of artery bypass graft   Midsternal chest pain    Time spent: 25 minutes, coordinating care.     Denali Becvar  [facility] Pager [phone]. If 7PM-7AM, please contact night-coverage at [email or web address], password [facility] 11/21/2013, 12:05 PM  LOS: 4 days

## 2013-11-21 NOTE — Progress Notes (Signed)
Patient ID: Antonio [name], male   DOB: Apr 29, 1946, 68 y.o.   MRN: [id number]   Subjective: NO pain ambulating on floor   Objective: Vital signs in last 24 hours: Temp:  [97.6 F (36.4 C)-99 F (37.2 C)] 98.7 F (37.1 C) (02/08 0500) Pulse Rate:  [61-69] 61 (02/08 0500) Resp:  [17-26] 18 (02/08 0500) BP: (124-153)/(81-95) 136/86 mmHg (02/08 0500) SpO2:  [98 %-100 %] 100 % (02/08 0500) Weight change:  Last BM Date: 11/20/13 Intake/Output from previous day: +720 02/07 0701 - 02/08 0700 In: 360 [P.O.:360] Out: 950 [Urine:950] Intake/Output this shift:    PE: General:Pleasant affect, NAD Skin:Warm and dry, brisk capillary refill HEENT:normocephalic, sclera clear, mucus membranes moist Neck:supple, no JVD  Heart:S1S2 RRR without murmur, gallup, rub or click Lungs:clear without rales, rhonchi, or wheezes QVZ:DGLO, non tender, + BS, do not palpate liver spleen or masses Ext:no lower ext edema, 2+ pedal pulses, 2+ radial pulses Neuro:alert and oriented, MAE, follows commands, + facial symmetry   EKG SR without acute changes.  Lab Results:  Recent Labs  11/19/13 0530 11/20/13 0415  WBC 4.7 3.9*  HGB 13.0 12.8*  HCT 37.7* 37.3*  PLT 132* 131*   BMET  Recent Labs  11/19/13 0530 11/20/13 0415  NA 143 141  K 4.0 4.2  CL 107 108  CO2 25 21  GLUCOSE 95 138*  BUN 11 12  CREATININE 1.20 1.17  CALCIUM 8.5 8.4    Recent Labs  11/18/13 0845  TROPONINI <0.30    Lab Results  Component Value Date   CHOL 106 11/18/2013   HDL 31* 11/18/2013   LDLCALC 60 11/18/2013   TRIG 73 11/18/2013   CHOLHDL 3.4 11/18/2013   Lab Results  Component Value Date   HGBA1C 12.9* 11/17/2013     Lab Results  Component Value Date   TSH 1.097 11/17/2013       Studies/Results: No results found.  Medications: I have reviewed the patient's current medications. Scheduled Meds: . amLODipine  5 mg Oral Daily  . [START ON 11/22/2013] aspirin  81 mg Oral Pre-Cath  . aspirin EC  81 mg Oral  Daily  . atorvastatin  40 mg Oral q1800  . docusate sodium  100 mg Oral BID  . insulin aspart  0-9 Units Subcutaneous Q4H  . living well with diabetes book   Does not apply Once  . metoprolol  50 mg Oral BID  . pantoprazole  40 mg Oral BID  . prasugrel  10 mg Oral Daily  . sodium chloride  3 mL Intravenous Q12H  . sodium chloride  3 mL Intravenous Q12H   Continuous Infusions: . sodium chloride    . sodium chloride     PRN Meds:.sodium chloride, sodium chloride, acetaminophen, morphine injection, nitroGLYCERIN, ondansetron (ZOFRAN) IV, sodium chloride, sodium chloride, sodium chloride, traMADol  Assessment/Plan: Principal Problem:   Chest pain Active Problems:   DM   HYPERTENSION   BRBPR (bright red blood per rectum)   Lower GI bleeding   CAD (coronary artery disease) of artery bypass graft   Midsternal chest pain  Chest Pain:  See note by Dr Radford Pax Cath planned for Monday R/O  Ok to be on DAT per GI  7:30 case with Dr Christ Kick  Chol:  Continue statin  GERD/GI:  Continue pantoprazole  Given need for stomach med using Effient

## 2013-11-22 ENCOUNTER — Encounter (HOSPITAL_COMMUNITY)
Admission: EM | Disposition: A | Discharge status: Home / Self Care | Payer: Self-pay | Source: Home / Self Care | Attending: Internal Medicine

## 2013-11-22 ENCOUNTER — Encounter (HOSPITAL_COMMUNITY): Payer: Self-pay | Admitting: Gastroenterology

## 2013-11-22 DIAGNOSIS — I2510 Atherosclerotic heart disease of native coronary artery without angina pectoris: Secondary | ICD-10-CM

## 2013-11-22 HISTORY — PX: LEFT HEART CATHETERIZATION WITH CORONARY/GRAFT ANGIOGRAM: SHX5450

## 2013-11-22 LAB — GLUCOSE, CAPILLARY: Glucose-Capillary: 220 mg/dL — ABNORMAL HIGH (ref 70–99)

## 2013-11-22 LAB — HIV ANTIBODY (ROUTINE TESTING W REFLEX): HIV: NONREACTIVE

## 2013-11-22 SURGERY — LEFT HEART CATHETERIZATION WITH CORONARY/GRAFT ANGIOGRAM
Anesthesia: LOCAL

## 2013-11-22 MED ORDER — PANTOPRAZOLE SODIUM 40 MG PO TBEC: 40.0000 mg | DELAYED_RELEASE_TABLET | Freq: Two times a day (BID) | ORAL | Status: DC

## 2013-11-22 MED ORDER — AMLODIPINE BESYLATE 5 MG PO TABS: 5.0000 mg | ORAL_TABLET | Freq: Every day | ORAL | Status: DC

## 2013-11-22 MED ORDER — NITROGLYCERIN 0.2 MG/ML ON CALL CATH LAB
INTRAVENOUS | Status: AC
  Filled 2013-11-22: qty 1

## 2013-11-22 MED ORDER — ACETAMINOPHEN 325 MG PO TABS: 650.0000 mg | ORAL_TABLET | ORAL | Status: DC | PRN

## 2013-11-22 MED ORDER — SODIUM CHLORIDE 0.9 % IV SOLN: 1.0000 mL/kg/h | INTRAVENOUS | Status: AC

## 2013-11-22 MED ORDER — MIDAZOLAM HCL 2 MG/2ML IJ SOLN
INTRAMUSCULAR | Status: AC
  Filled 2013-11-22: qty 2

## 2013-11-22 MED ORDER — FENTANYL CITRATE 0.05 MG/ML IJ SOLN
INTRAMUSCULAR | Status: AC
  Filled 2013-11-22: qty 2

## 2013-11-22 MED ORDER — HEPARIN (PORCINE) IN NACL 2-0.9 UNIT/ML-% IJ SOLN
INTRAMUSCULAR | Status: AC
  Filled 2013-11-22: qty 1000

## 2013-11-22 MED ORDER — LOSARTAN POTASSIUM-HCTZ 100-25 MG PO TABS: 1.0000 | ORAL_TABLET | Freq: Every day | ORAL | Status: DC

## 2013-11-22 MED ORDER — ONDANSETRON HCL 4 MG/2ML IJ SOLN: 4.0000 mg | Freq: Four times a day (QID) | INTRAMUSCULAR | Status: DC | PRN

## 2013-11-22 MED ORDER — LIDOCAINE HCL (PF) 1 % IJ SOLN
INTRAMUSCULAR | Status: AC
  Filled 2013-11-22: qty 30

## 2013-11-22 MED ORDER — INSULIN ASPART 100 UNIT/ML ~~LOC~~ SOLN
0.0000 [IU] | Freq: Three times a day (TID) | SUBCUTANEOUS | Status: DC
  Administered 2013-11-22: 3 [IU] via SUBCUTANEOUS

## 2013-11-22 NOTE — Plan of Care (Signed)
Problem: Food- and Nutrition-Related Knowledge Deficit (NB-1.1) Goal: Nutrition education Formal process to instruct or train a patient/client in a skill or to impart knowledge to help patients/clients voluntarily manage or modify food choices and eating behavior to maintain or improve health. Outcome: Completed/Met Date Met:  11/22/13  RD consulted for nutrition education regarding diabetes. Spoke with patient and his wife. Both were very receptive to information provided.    Lab Results  Component Value Date    HGBA1C 12.9* 11/17/2013    RD provided "Carbohydrate Counting for People with Diabetes" handout from the Academy of [employer] Dietetics. Discussed different food groups and their effects on blood sugar, emphasizing carbohydrate-containing foods. Provided list of carbohydrates and recommended serving sizes of common foods.  Discussed importance of controlled and consistent carbohydrate intake throughout the day. Provided examples of ways to balance meals/snacks and encouraged intake of high-fiber, whole grain complex carbohydrates. Teach back method used.  Expect good compliance.  Body mass index is 27.63 kg/(m^2). Pt meets criteria for overweight based on current BMI.  Current diet order is CHO-modified heart healthy, patient is consuming approximately 100% of meals at this time. Labs and medications reviewed. No further nutrition interventions warranted at this time. RD contact information provided. If additional nutrition issues arise, please re-consult RD.  Molli Barrows, RD, LDN, [place] Pager [phone] After Hours Pager [phone]

## 2013-11-22 NOTE — Discharge Instructions (Signed)

## 2013-11-22 NOTE — Interval H&P Note (Signed)
Cath Lab Visit (complete for each Cath Lab visit)  Clinical Evaluation Leading to the Procedure:   ACS: yes  Non-ACS:    Anginal Classification: CCS IV  Anti-ischemic medical therapy: Maximal Therapy (2 or more classes of medications)  Non-Invasive Test Results: No non-invasive testing performed  Prior CABG: Previous CABG      History and Physical Interval Note:  11/22/2013 7:47 AM  [name]  has presented today for surgery, with the diagnosis of cp  The various methods of treatment have been discussed with the patient and family. After consideration of risks, benefits and other options for treatment, the patient has consented to  Procedure(s): LEFT HEART CATHETERIZATION WITH CORONARY/GRAFT ANGIOGRAM (N/A) as a surgical intervention .  The patient's history has been reviewed, patient examined, no change in status, stable for surgery.  I have reviewed the patient's chart and labs.  Questions were answered to the patient's satisfaction.     Steve Gregg S.

## 2013-11-22 NOTE — Progress Notes (Signed)
The Chaplain offered emotional and spiritual support to the patient today. The visit was brief between the Chaplain and the patient because the patient was getting ready to be discharged, so the patient thanked the Chaplain for stopping by and the Chaplain prayed with the patient at the end of the visit.  Chaplain Clista Bernhardt Hisham Provence

## 2013-11-22 NOTE — Progress Notes (Signed)
Pt up ambulating in hallway; cath site level 0, no bleeding noted

## 2013-11-22 NOTE — Progress Notes (Signed)
D/c orders received;IVs removed with gauze on, pt remains in stable condition, pt meds and instructions reviewed and given to pt; reviewed post cath site care with pt; pt d/c to home

## 2013-11-22 NOTE — H&P (View-Only) (Signed)
Patient ID: Antonio [name], male   DOB: 10/01/1946, 67 y.o.   MRN: [id number]   Subjective: NO pain ambulating on floor   Objective: Vital signs in last 24 hours: Temp:  [97.6 F (36.4 C)-99 F (37.2 C)] 98.7 F (37.1 C) (02/08 0500) Pulse Rate:  [61-69] 61 (02/08 0500) Resp:  [17-26] 18 (02/08 0500) BP: (124-153)/(81-95) 136/86 mmHg (02/08 0500) SpO2:  [98 %-100 %] 100 % (02/08 0500) Weight change:  Last BM Date: 11/20/13 Intake/Output from previous day: +720 02/07 0701 - 02/08 0700 In: 360 [P.O.:360] Out: 950 [Urine:950] Intake/Output this shift:    PE: General:Pleasant affect, NAD Skin:Warm and dry, brisk capillary refill HEENT:normocephalic, sclera clear, mucus membranes moist Neck:supple, no JVD  Heart:S1S2 RRR without murmur, gallup, rub or click Lungs:clear without rales, rhonchi, or wheezes Abd:soft, non tender, + BS, do not palpate liver spleen or masses Ext:no lower ext edema, 2+ pedal pulses, 2+ radial pulses Neuro:alert and oriented, MAE, follows commands, + facial symmetry   EKG SR without acute changes.  Lab Results:  Recent Labs  11/19/13 0530 11/20/13 0415  WBC 4.7 3.9*  HGB 13.0 12.8*  HCT 37.7* 37.3*  PLT 132* 131*   BMET  Recent Labs  11/19/13 0530 11/20/13 0415  NA 143 141  K 4.0 4.2  CL 107 108  CO2 25 21  GLUCOSE 95 138*  BUN 11 12  CREATININE 1.20 1.17  CALCIUM 8.5 8.4    Recent Labs  11/18/13 0845  TROPONINI <0.30    Lab Results  Component Value Date   CHOL 106 11/18/2013   HDL 31* 11/18/2013   LDLCALC 60 11/18/2013   TRIG 73 11/18/2013   CHOLHDL 3.4 11/18/2013   Lab Results  Component Value Date   HGBA1C 12.9* 11/17/2013     Lab Results  Component Value Date   TSH 1.097 11/17/2013       Studies/Results: No results found.  Medications: I have reviewed the patient's current medications. Scheduled Meds: . amLODipine  5 mg Oral Daily  . [START ON 11/22/2013] aspirin  81 mg Oral Pre-Cath  . aspirin EC  81 mg Oral  Daily  . atorvastatin  40 mg Oral q1800  . docusate sodium  100 mg Oral BID  . insulin aspart  0-9 Units Subcutaneous Q4H  . living well with diabetes book   Does not apply Once  . metoprolol  50 mg Oral BID  . pantoprazole  40 mg Oral BID  . prasugrel  10 mg Oral Daily  . sodium chloride  3 mL Intravenous Q12H  . sodium chloride  3 mL Intravenous Q12H   Continuous Infusions: . sodium chloride    . sodium chloride     PRN Meds:.sodium chloride, sodium chloride, acetaminophen, morphine injection, nitroGLYCERIN, ondansetron (ZOFRAN) IV, sodium chloride, sodium chloride, sodium chloride, traMADol  Assessment/Plan: Principal Problem:   Chest pain Active Problems:   DM   HYPERTENSION   BRBPR (bright red blood per rectum)   Lower GI bleeding   CAD (coronary artery disease) of artery bypass graft   Midsternal chest pain  Chest Pain:  See note by Dr Turner Cath planned for Monday R/O  Ok to be on DAT per GI  7:30 case with Dr Varnasi  Chol:  Continue statin  GERD/GI:  Continue pantoprazole  Given need for stomach med using Effient     

## 2013-11-22 NOTE — Discharge Summary (Signed)
Physician Discharge Summary  Jabaris Clemenson [id number] DOB: 11-01-1945 DOA: 11/17/2013  PCP: Stacy Gardner, PA-C  Admit date: 11/17/2013 Discharge date: 11/22/2013  Time spent: 35 minutes  Recommendations for Outpatient Follow-up:  1. Needs to follow up with Dr Martinique for further adjustment of medication. Consideration for MRI.  2. Need CBC to follow up hb and WBC.   Discharge Diagnoses:    Angina   BRBPR (bright red blood per rectum)   DM   HYPERTENSION   Lower GI bleeding   CAD (coronary artery disease) of artery bypass graft   Midsternal chest pain   Discharge Condition: Stable.   Diet recommendation: Carb Modified diet.   Filed Weights   11/17/13 1606 11/19/13 0500 11/21/13 1100  Weight: 78.4 kg (172 lb 13.5 oz) 80.105 kg (176 lb 9.6 oz) 82.4 kg (181 lb 10.5 oz)    History of present illness:    Hospital Course:  1-Chest pain/ Angina. Resolved with Nitroglycerin gtt. Troponin times 3 negative. Order PRN nitroglycerin. On metoprolol, Lipitor. Cath show CAD. Cardio recommend medical management.  2-GI bleed, melena; Hb stable at 12. Hb last month at 16. Continue with protonix. Endoscopy no source of bleeding or that could explain melena. Ok to resume effient per GI. No need for  colonoscopy.  3-Diabetes; SSI. Resume lantus at discharge.  4-Thrombocytopenia; monitor. Improving.  5-DVT prophylaxis: SCD>  6-Leukopenia;  HIV negative.    Procedures: Cath; Widely patent left main coronary artery. Occluded mid left anterior descending artery. Patent LIMA to LAD. Severely diseased mid left circumflex artery and its OM branches. The jump graft SVG to OM 1 and OM 2 is widely patent Occluded mid right coronary artery. The SVG to PDA is widely patent Normal left ventricular systolic function. 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 Ejection fraction 55%.  Endoscopy; Normal esophagus. Mild diffuse gastritis, otherwise  normal stomach and pylorus. Normal duodenum to the second  portion.  No old or fresh blood seen to the extent of our examination.  The scope was then withdrawn from the patient and the procedure  completed.   Consultations:  Cardiology  Discharge Exam: Filed Vitals:   11/22/13 1115  BP: 142/83  Pulse: 66  Temp:   Resp:     General: No distress.  Cardiovascular: S 1, S 2 RRR Respiratory: CTA  Discharge Instructions  Discharge Orders   Future Appointments Provider Department Dept Phone   12/30/2013 3:00 PM Anne Shutter, [place]   Future Orders Complete By Expires   Diet - low sodium heart healthy  As directed    Diet - low sodium heart healthy  As directed    Increase activity slowly  As directed    Increase activity slowly  As directed        Medication List    STOP taking these medications       tadalafil 20 MG tablet  Commonly known as:  CIALIS      TAKE these medications       aspirin EC 81 MG tablet  Take 81 mg by mouth daily.     Insulin Pen Needle 32G X 4 MM Misc  Commonly known as:  BD PEN NEEDLE NANO U/F  1 pen by Does not apply route daily.     LANTUS SOLOSTAR 100 UNIT/ML Solostar Pen  Generic drug:  Insulin Glargine  Inject 20 Units into the skin every morning.     losartan-hydrochlorothiazide 100-25 MG per tablet  Commonly known as:  HYZAAR  Take 1 tablet by mouth daily.     metoprolol 50 MG tablet  Commonly known as:  LOPRESSOR  Take 1 tablet (50 mg total) by mouth 2 (two) times daily.     multivitamin with minerals Tabs tablet  Take 1 tablet by mouth daily.     nitroGLYCERIN 0.4 MG SL tablet  Commonly known as:  NITROSTAT  Place 1 tablet (0.4 mg total) under the tongue every 5 (five) minutes x 3 doses as needed for chest pain.     pantoprazole 40 MG tablet  Commonly known as:  PROTONIX  Take 1 tablet (40 mg total) by mouth 2 (two) times daily.     prasugrel 10 MG Tabs tablet  Commonly known as:  EFFIENT  Take 1 tablet (10  mg total) by mouth daily.     simvastatin 80 MG tablet  Commonly known as:  ZOCOR  Take 0.5 tablets (40 mg total) by mouth every morning.     traMADol 50 MG tablet  Commonly known as:  ULTRAM  Take 1 tablet (50 mg total) by mouth every 6 (six) hours as needed for pain.       Allergies  Allergen Reactions  . Demerol Other (See Comments)    hallucinations  . Meperidine And Related        Follow-up Information   Follow up with Peter Martinique, MD In 1 week.   Specialty:  Cardiology   Contact information:   [address] [place] [address] [phone]        The results of significant diagnostics from this hospitalization (including imaging, microbiology, ancillary and laboratory) are listed below for reference.    Significant Diagnostic Studies: Dg Chest 2 View  11/17/2013   CLINICAL DATA:  Left chest pain/pressure  EXAM: CHEST  2 VIEW  COMPARISON:  02/01/2013  FINDINGS: Lungs are clear. No pleural effusion or pneumothorax.  The heart is normal in size. Postsurgical changes related to prior CABG.  Mild degenerative changes of the visualized thoracolumbar spine. Cervical spine fixation hardware.  IMPRESSION: No evidence of acute cardiopulmonary disease.   Electronically Signed   By: Julian Hy M.D.   On: 11/17/2013 17:28    Microbiology: No results found for this or any previous visit (from the past 240 hour(s)).   Labs: Basic Metabolic Panel:  Recent Labs Lab 11/17/13 1608 11/18/13 0610 11/19/13 0530 11/20/13 0415 11/21/13 1000  NA 144 143 143 141 138  K 3.8 3.7 4.0 4.2 4.2  CL 106 107 107 108 103  CO2 27 25 25 21 21   GLUCOSE 186* 72 95 138* 314*  BUN 18 16 11 12 12   CREATININE 1.29 1.22 1.20 1.17 1.08  CALCIUM 9.0 8.4 8.5 8.4 8.4  MG  --  1.7  --   --   --    Liver Function Tests: No results found for this basename: AST, ALT, ALKPHOS, BILITOT, PROT, ALBUMIN,  in the last 168 hours No results found for this basename: LIPASE, AMYLASE,   in the last 168 hours No results found for this basename: AMMONIA,  in the last 168 hours CBC:  Recent Labs Lab 11/18/13 1320 11/18/13 2233 11/19/13 0530 11/20/13 0415 11/21/13 1000  WBC 5.2 4.5 4.7 3.9* 3.8*  HGB 12.7* 12.9* 13.0 12.8* 13.3  HCT 36.5* 37.3* 37.7* 37.3* 38.3*  MCV 87.3 87.1 87.1 87.4 86.8  PLT 128* 131* 132* 131* 132*   Cardiac Enzymes:  Recent Labs  Lab 11/17/13 2232 11/18/13 0610 11/18/13 0845  TROPONINI <0.30 <0.30 <0.30   BNP: BNP (last 3 results)  Recent Labs  11/17/13 1608  PROBNP 219.5*   CBG:  Recent Labs Lab 11/21/13 0730 11/21/13 1137 11/21/13 1644 11/21/13 2114 11/22/13 1124  GLUCAP 137* 190* 160* 202* 220*       Signed:  Crystale Giannattasio  [facility] 11/22/2013, 12:04 PM

## 2013-11-22 NOTE — CV Procedure (Addendum)
    PROCEDURE:  Left heart catheterization with selective coronary angiography, left ventriculogram. Bypass graft angiography.  INDICATIONS:  Unstable angina  The risks, benefits, and details of the procedure were explained to the patient.  The patient verbalized understanding and wanted to proceed.  Informed written consent was obtained.  PROCEDURE TECHNIQUE:  After Xylocaine anesthesia a 96F sheath was placed in the right femoral artery with a single anterior needle wall stick.   Left coronary angiography was done using a Judkins L4 guide catheter.  Right coronary angiography was done using a Judkins R4 guide catheter. An LCB guide was used to engage the SVG to OM and SVG to diagonal. The JR 4 guide was used to engage the SVG to PDA. An IMA catheter was used to engage the LIMA. Left ventriculography was done using a pigtail catheter.  Manual compression was used for hemostasis.   CONTRAST:  Total of 110 cc.  COMPLICATIONS:  None.    HEMODYNAMICS:  Aortic pressure was 125/72; LV pressure was 120/5; LVEDP 15.  There was no gradient between the left ventricle and aorta.    ANGIOGRAPHIC DATA:   The left main coronary artery is widely patent.  The left anterior descending artery is occluded in the midportion of the vessel. The mid to distal LAD fills by widely patent LIMA to LAD. There is mild diffuse disease in the mid to distal LAD. The proximal vessel has moderate disease and moderate calcification.  The LIMA to LAD is widely patent.  The SVG to diagonal is widely patent  The left circumflex artery is a large vessel proximally. In the proximal vessel, there is a moderate, 50% stenosis. There is a large atrial branch which originates and is widely patent. There is an OM with significant, severe diffuse disease. The circumflex vessel is quite small.  The jump graft SVG to OM 1 and OM 2 is widely patent  The right coronary artery is a medium size vessel. The mid vessel is occluded. There are  bridging collaterals providing some antegrade flow.  The SVG to PDA is widely patent.  LEFT VENTRICULOGRAM:  Left ventricular angiogram was done in the 30 RAO projection and revealed normal left ventricular wall motion and systolic function with an estimated ejection fraction of 55 %.  LVEDP was 15 mmHg.  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 This was present on the ventriculogram done in 2010.  IMPRESSIONS:  1. Widely patent left main coronary artery. 2. Occluded mid left anterior descending artery. Patent LIMA to LAD. 3. Severely diseased mid left circumflex artery and its OM branches.  The jump graft SVG to OM 1 and OM 2 is widely patent 4. Occluded mid right coronary artery.  The SVG to PDA is widely patent 5. Normal left ventricular systolic function.   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  Ejection fraction 55%.  RECOMMENDATION:   Continue medical therapy. Grafts appear unchanged.  The patient will followup with Dr. Martinique. Unclear if any management is required for this basal inferior aneurysm. Consider MRI. It has been present for at least 5 years.

## 2013-11-22 NOTE — Progress Notes (Signed)
Pt has watched Diabetes Video #507 and Mosby's notes given to patient.

## 2013-11-22 NOTE — ED Provider Notes (Signed)
Medical screening examination/treatment/procedure(s) were conducted as a shared visit with non-physician practitioner(s) and myself.  I personally evaluated the patient during the encounter.   Chest pain. Unstable angina, but also GI bleed. Admit to internal medicinee  Ovid Curd R. Alvino Chapel, MD 11/22/13 2039

## 2013-11-23 LAB — GLUCOSE, CAPILLARY: Glucose-Capillary: 125 mg/dL — ABNORMAL HIGH (ref 70–99)

## 2013-11-25 NOTE — Anesthesia Postprocedure Evaluation (Signed)
Anesthesia Post Note  Patient: Antonio [name]  Procedure(s) Performed: Procedure(s) (LRB): ESOPHAGOGASTRODUODENOSCOPY (EGD) (Left)  Anesthesia type: MAC  Patient location: PACU  Post pain: Pain level controlled and Adequate analgesia  Post assessment: Post-op Vital signs reviewed, Patient's Cardiovascular Status Stable and Respiratory Function Stable  Last Vitals:  Filed Vitals:   11/22/13 1315  BP: 124/96  Pulse: 66  Temp:   Resp:     Post vital signs: Reviewed and stable  Level of consciousness: awake, alert  and oriented  Complications: No apparent anesthesia complications

## 2013-11-29 ENCOUNTER — Encounter: Payer: BC Managed Care – PPO | Admitting: Cardiology

## 2013-12-07 ENCOUNTER — Telehealth: Payer: Self-pay | Admitting: Cardiology

## 2013-12-07 NOTE — Telephone Encounter (Signed)
Walk In pt Form " Return From Medical Leave Of Absence" gave to [facility]

## 2013-12-10 ENCOUNTER — Telehealth: Payer: Self-pay | Admitting: Cardiology

## 2013-12-10 NOTE — Telephone Encounter (Signed)
Called wanting to know if form ready for him to return to work.  States needs to return to work Monday.  Found form on Cheryl Pugh's cart that Dr. Martinique had signed.  Advised that would leave at front desk for him to pick up.  Also made him an appointment to see Kathrene Alu on 3/16 as follow up from cath. No appointments sooner.

## 2013-12-10 NOTE — Telephone Encounter (Signed)
New message     Pt brought a paper to [place] last Friday to be completed by dr so that he can return to work.  Is the paper completed?

## 2013-12-14 ENCOUNTER — Encounter: Payer: Self-pay | Admitting: Physician Assistant

## 2013-12-14 ENCOUNTER — Ambulatory Visit (INDEPENDENT_AMBULATORY_CARE_PROVIDER_SITE_OTHER): Payer: BC Managed Care – PPO | Admitting: Physician Assistant

## 2013-12-14 ENCOUNTER — Other Ambulatory Visit (INDEPENDENT_AMBULATORY_CARE_PROVIDER_SITE_OTHER): Payer: BC Managed Care – PPO

## 2013-12-14 VITALS — BP 142/90 | HR 76 | Temp 98.2°F | Wt 182.0 lb

## 2013-12-14 DIAGNOSIS — E1310 Other specified diabetes mellitus with ketoacidosis without coma: Secondary | ICD-10-CM

## 2013-12-14 DIAGNOSIS — E1110 Type 2 diabetes mellitus with ketoacidosis without coma: Secondary | ICD-10-CM

## 2013-12-14 DIAGNOSIS — I10 Essential (primary) hypertension: Secondary | ICD-10-CM

## 2013-12-14 DIAGNOSIS — Z0000 Encounter for general adult medical examination without abnormal findings: Secondary | ICD-10-CM

## 2013-12-14 DIAGNOSIS — E119 Type 2 diabetes mellitus without complications: Secondary | ICD-10-CM

## 2013-12-14 LAB — CBC WITH DIFFERENTIAL/PLATELET
BASOS PCT: 1.4 % (ref 0.0–3.0)
Basophils Absolute: 0.1 10*3/uL (ref 0.0–0.1)
EOS PCT: 4.8 % (ref 0.0–5.0)
Eosinophils Absolute: 0.3 10*3/uL (ref 0.0–0.7)
HEMATOCRIT: 39.9 % (ref 39.0–52.0)
Hemoglobin: 13 g/dL (ref 13.0–17.0)
LYMPHS ABS: 1.7 10*3/uL (ref 0.7–4.0)
Lymphocytes Relative: 26.7 % (ref 12.0–46.0)
MCHC: 32.7 g/dL (ref 30.0–36.0)
MCV: 90.9 fl (ref 78.0–100.0)
MONO ABS: 0.4 10*3/uL (ref 0.1–1.0)
Monocytes Relative: 6.6 % (ref 3.0–12.0)
Neutro Abs: 3.8 10*3/uL (ref 1.4–7.7)
Neutrophils Relative %: 60.5 % (ref 43.0–77.0)
Platelets: 149 10*3/uL — ABNORMAL LOW (ref 150.0–400.0)
RBC: 4.39 Mil/uL (ref 4.22–5.81)
RDW: 16 % — ABNORMAL HIGH (ref 11.5–14.6)
WBC: 6.4 10*3/uL (ref 4.5–10.5)

## 2013-12-14 LAB — HEPATIC FUNCTION PANEL
ALK PHOS: 55 U/L (ref 39–117)
ALT: 20 U/L (ref 0–53)
AST: 21 U/L (ref 0–37)
Albumin: 3.9 g/dL (ref 3.5–5.2)
BILIRUBIN DIRECT: 0.1 mg/dL (ref 0.0–0.3)
Total Bilirubin: 0.7 mg/dL (ref 0.3–1.2)
Total Protein: 6.8 g/dL (ref 6.0–8.3)

## 2013-12-14 LAB — LIPID PANEL
CHOL/HDL RATIO: 3
Cholesterol: 150 mg/dL (ref 0–200)
HDL: 43.8 mg/dL (ref >39.00)
LDL CALC: 92 mg/dL (ref 0–99)
Triglycerides: 71 mg/dL (ref 0.0–149.0)
VLDL: 14.2 mg/dL (ref 0.0–40.0)

## 2013-12-14 LAB — URINALYSIS, ROUTINE W REFLEX MICROSCOPIC
Bilirubin Urine: NEGATIVE
HGB URINE DIPSTICK: NEGATIVE
KETONES UR: NEGATIVE
Leukocytes, UA: NEGATIVE
Nitrite: NEGATIVE
RBC / HPF: NONE SEEN (ref 0–?)
Specific Gravity, Urine: 1.015 (ref 1.000–1.030)
Total Protein, Urine: NEGATIVE
URINE GLUCOSE: NEGATIVE
UROBILINOGEN UA: 0.2 (ref 0.0–1.0)
pH: 6 (ref 5.0–8.0)

## 2013-12-14 LAB — BASIC METABOLIC PANEL
BUN: 11 mg/dL (ref 6–23)
CHLORIDE: 102 meq/L (ref 96–112)
CO2: 26 mEq/L (ref 19–32)
CREATININE: 1.4 mg/dL (ref 0.4–1.5)
Calcium: 8.9 mg/dL (ref 8.4–10.5)
GFR: 65.35 mL/min (ref >60.00)
Glucose, Bld: 100 mg/dL — ABNORMAL HIGH (ref 70–99)
Potassium: 3.6 mEq/L (ref 3.5–5.1)
Sodium: 138 mEq/L (ref 135–145)

## 2013-12-14 LAB — MICROALBUMIN / CREATININE URINE RATIO
Creatinine,U: 126.1 mg/dL
MICROALB/CREAT RATIO: 1.7 mg/g (ref 0.0–30.0)
Microalb, Ur: 2.1 mg/dL — ABNORMAL HIGH (ref 0.0–1.9)

## 2013-12-14 LAB — HEMOGLOBIN A1C: Hgb A1c MFr Bld: 10.7 % — ABNORMAL HIGH (ref 4.6–6.5)

## 2013-12-14 NOTE — Progress Notes (Signed)
Subjective:    Patient ID: Antonio [name], male    DOB: Jan 24, 1946, 68 y.o.   MRN: [id number]  HPI Comments: Patient is a 68 year old male who presents to the office to follow up on his diabetic medications. Previously patient having difficulty regulating CBG's. Has been tracking blood sugar over last couple weeks since starting on Lantus 20 units daily and taking Janumet XR 100/1000 mg. Blood sugars have been running anywhere from 100 to 150's with a few instances of low 200's. States is not taking his Lantus daily as instructed because his sugars had  been getting lower than he is comfortable with, in the 70-80's at times. Reports difficult to eat at regular times since he works third shift.  Patient referred to diabetic nutrition counseling which he has not been to yet. Denies N/V/D, visual change/disturbance, dizziness, fatigue or lightheaded.    Review of Systems  Constitutional: Negative for fever and chills.  Eyes: Negative for visual disturbance.  Respiratory: Negative for shortness of breath.   Cardiovascular: Negative for chest pain.  Gastrointestinal: Negative for nausea and vomiting.  Neurological: Negative for dizziness, weakness, light-headedness and headaches.   Past Medical History  Diagnosis Date  . 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 HYPERCHOLESTEROLEMIA 12/17/2010  . HYPERTENSION 12/17/2010  . CAD     a. s/p CABG 1996 - last cath 08/2013 - patent grafts.  Marland Kitchen BENIGN PROSTATIC HYPERTROPHY, WITH OBSTRUCTION 12/17/2010  . Prostate cancer     s/p radiation   . Myocardial infarction 1996; 2006  . Type II diabetes mellitus 12/17/2010  . Bleeding per rectum     "related to prostate cancer and hemorrhoids" (09/03/2012)  . GERD (gastroesophageal reflux disease)   . Urine incontinence   . Chicken pox        Objective:   Physical Exam  Vitals reviewed. Constitutional: He is oriented to person, place, and time. He appears well-developed and well-nourished. No distress.    HENT:  Head: Normocephalic and atraumatic.  Right Ear: Tympanic membrane, external ear and ear canal normal.  Left Ear: Tympanic membrane, external ear and ear canal normal.  Nose: Nose normal.  Mouth/Throat: Uvula is midline, oropharynx is clear and moist and mucous membranes are normal.  Eyes: Conjunctivae and EOM are normal. Pupils are equal, round, and reactive to light.  Neck: Trachea normal and normal range of motion. Carotid bruit is not present. No thyromegaly present.  Cardiovascular: Normal rate, regular rhythm, normal heart sounds and intact distal pulses.  Exam reveals no gallop and no friction rub.   No murmur heard. Pulses:      Dorsalis pedis pulses are 2+ on the right side, and 2+ on the left side.       Posterior tibial pulses are 2+ on the right side, and 2+ on the left side.  Pulmonary/Chest: Effort normal and breath sounds normal. He has no wheezes. He has no rhonchi. He has no rales.  Abdominal: Normal appearance. There is no hepatosplenomegaly. There is no tenderness. There is no CVA tenderness.  Genitourinary:  deferred  Musculoskeletal:  FROM of ULE bilateral, FROM thoracic and lumbar spine. Diabetic foot exam complete. Bilateral toenails thickened and discolored.   Lymphadenopathy:    He has no cervical adenopathy.       Right: No supraclavicular adenopathy present.       Left: No supraclavicular adenopathy present.  Neurological: He is alert and oriented to person, place, and time. He has normal strength. No cranial nerve deficit  or sensory deficit. He displays a negative Romberg sign. Coordination and gait normal. GCS eye subscore is 4. GCS verbal subscore is 5. GCS motor subscore is 6.  Reflex Scores:      Patellar reflexes are 2+ on the right side and 2+ on the left side. Skin: Skin is warm and dry.  Psychiatric: He has a normal mood and affect.    Lab Results  Component Value Date   WBC 6.4 12/14/2013   HGB 13.0 12/14/2013   HCT 39.9 12/14/2013   PLT  149.0* 12/14/2013   GLUCOSE 100* 12/14/2013   CHOL 150 12/14/2013   TRIG 71.0 12/14/2013   HDL 43.80 12/14/2013   LDLCALC 92 12/14/2013   ALT 20 12/14/2013   AST 21 12/14/2013   NA 138 12/14/2013   K 3.6 12/14/2013   CL 102 12/14/2013   CREATININE 1.4 12/14/2013   BUN 11 12/14/2013   CO2 26 12/14/2013   TSH 1.097 11/17/2013   INR 0.97 11/21/2013   HGBA1C 10.7* 12/14/2013   MICROALBUR 2.1* 12/14/2013   Filed Vitals:   12/14/13 1048  BP: 142/90  Pulse: 76  Temp: 98.2 F (36.8 C)       Assessment & Plan:    CPX/v70.0 - Patient has been counseled on age-appropriate routine health concerns for screening and prevention. These are reviewed and up-to-date. Immunizations are up-to-date or declined. Labs ordered and will be reviewed.  HM Referral to opthalmology Referral to podiatry   DM: Patient counseled to use Lantus on daily basis, he expresses concern for low blood sugar, will agree to 10 units of Lantus daily with taking glucometer readings twice daily, am-fasting and one hour after evening meal. Change to Lantus 10 units daily  Janumet 100/1000mg  once daily  HTN: Continue medications as prescribed  CAD: Follow up with cardiologist, appointment later this month  Prostate cancer: Follows with [facility].  Bright Red Blood per Rectum Chronic, intermittent, secondary to hemorrhoids and prostate cancer per patient, Follows with [facility]

## 2013-12-14 NOTE — Progress Notes (Signed)
Pre-visit discussion using our clinic review tool. No additional management support is needed unless otherwise documented below in the visit note.  

## 2013-12-14 NOTE — Assessment & Plan Note (Addendum)
Patient counseled to use Lantus on daily basis, he expresses concern for low blood sugar, will agree to 10 units of Lantus daily with taking glucometer readings twice daily, am-fasting and one hour after evening meal. Change to Lantus 10 units daily  Janumet 100/1000mg  once daily

## 2013-12-14 NOTE — Patient Instructions (Signed)
[name].  I have ordered labs for you please report to the lab in the basement.  Diabetes and Foot Care Diabetes may cause you to have problems because of poor blood supply (circulation) to your feet and legs. This may cause the skin on your feet to become thinner, break easier, and heal more slowly. Your skin may become dry, and the skin may peel and crack. You may also have nerve damage in your legs and feet causing decreased feeling in them. You may not notice minor injuries to your feet that could lead to infections or more serious problems. Taking care of your feet is one of the most important things you can do for yourself.  HOME CARE INSTRUCTIONS  Wear shoes at all times, even in the house. Do not go barefoot. Bare feet are easily injured.  Check your feet daily for blisters, cuts, and redness. If you cannot see the bottom of your feet, use a mirror or ask someone for help.  Wash your feet with warm water (do not use hot water) and mild soap. Then pat your feet and the areas between your toes until they are completely dry. Do not soak your feet as this can dry your skin.  Apply a moisturizing lotion or petroleum jelly (that does not contain alcohol and is unscented) to the skin on your feet and to dry, brittle toenails. Do not apply lotion between your toes.  Trim your toenails straight across. Do not dig under them or around the cuticle. File the edges of your nails with an emery board or nail file.  Do not cut corns or calluses or try to remove them with medicine.  Wear clean socks or stockings every day. Make sure they are not too tight. Do not wear knee-high stockings since they may decrease blood flow to your legs.  Wear shoes that fit properly and have enough cushioning. To break in new shoes, wear them for just a few hours a day. This prevents you from injuring your feet. Always look in your shoes before you put them on to be sure there are no objects  inside.  Do not cross your legs. This may decrease the blood flow to your feet.  If you find a minor scrape, cut, or break in the skin on your feet, keep it and the skin around it clean and dry. These areas may be cleansed with mild soap and water. Do not cleanse the area with peroxide, alcohol, or iodine.  When you remove an adhesive bandage, be sure not to damage the skin around it.  If you have a wound, look at it several times a day to make sure it is healing.  Do not use heating pads or hot water bottles. They may burn your skin. If you have lost feeling in your feet or legs, you may not know it is happening until it is too late.  Make sure your health care provider performs a complete foot exam at least annually or more often if you have foot problems. Report any cuts, sores, or bruises to your health care provider immediately. SEEK MEDICAL CARE IF:   You have an injury that is not healing.  You have cuts or breaks in the skin.  You have an ingrown nail.  You notice redness on your legs or feet.  You feel burning or tingling in your legs or feet.  You have pain or cramps in your legs and feet.  Your legs or feet are numb.  Your feet always feel cold. SEEK IMMEDIATE MEDICAL CARE IF:   There is increasing redness, swelling, or pain in or around a wound.  There is a red line that goes up your leg.  Pus is coming from a wound.  You develop a fever or as directed by your health care provider.  You notice a bad smell coming from an ulcer or wound. Document Released: 09/27/2000 Document Revised: 06/02/2013 Document Reviewed: 03/09/2013 [facility] Patient Information 2014 [place]. Diabetes and Exercise Exercising regularly is important. It is not just about losing weight. It has many health benefits, such as:  Improving your overall fitness, flexibility, and endurance.  Increasing your bone density.  Helping with weight control.  Decreasing your body  fat.  Increasing your muscle strength.  Reducing stress and tension.  Improving your overall health. People with diabetes who exercise gain additional benefits because exercise:  Reduces appetite.  Improves the body's use of blood sugar (glucose).  Helps lower or control blood glucose.  Decreases blood pressure.  Helps control blood lipids (such as cholesterol and triglycerides).  Improves the body's use of the hormone insulin by:  Increasing the body's insulin sensitivity.  Reducing the body's insulin needs.  Decreases the risk for heart disease because exercising:  Lowers cholesterol and triglycerides levels.  Increases the levels of good cholesterol (such as high-density lipoproteins [HDL]) in the body.  Lowers blood glucose levels. YOUR ACTIVITY PLAN  Choose an activity that you enjoy and set realistic goals. Your health care provider or diabetes educator can help you make an activity plan that works for you. You can break activities into 2 or 3 sessions throughout the day. Doing so is as good as one long session. Exercise ideas include:  Taking the dog for a walk.  Taking the stairs instead of the elevator.  Dancing to your favorite song.  Doing your favorite exercise with a friend. RECOMMENDATIONS FOR EXERCISING WITH TYPE 1 OR TYPE 2 DIABETES   Check your blood glucose before exercising. If blood glucose levels are greater than 240 mg/dL, check for urine ketones. Do not exercise if ketones are present.  Avoid injecting insulin into areas of the body that are going to be exercised. For example, avoid injecting insulin into:  The arms when playing tennis.  The legs when jogging.  Keep a record of:  Food intake before and after you exercise.  Expected peak times of insulin action.  Blood glucose levels before and after you exercise.  The type and amount of exercise you have done.  Review your records with your health care provider. Your health care  provider will help you to develop guidelines for adjusting food intake and insulin amounts before and after exercising.  If you take insulin or oral hypoglycemic agents, watch for signs and symptoms of hypoglycemia. They include:  Dizziness.  Shaking.  Sweating.  Chills.  Confusion.  Drink plenty of water while you exercise to prevent dehydration or heat stroke. Body water is lost during exercise and must be replaced.  Talk to your health care provider before starting an exercise program to make sure it is safe for you. Remember, almost any type of activity is better than none. Document Released: 12/21/2003 Document Revised: 06/02/2013 Document Reviewed: 03/09/2013 [facility] Patient Information 2014 [place]. Health Maintenance, Males A healthy lifestyle and preventative care can promote health and wellness.  Maintain regular health, dental, and eye exams.  Eat a healthy diet. Foods like  vegetables, fruits, whole grains, low-fat dairy products, and lean protein foods contain the nutrients you need and are low in calories. Decrease your intake of foods high in solid fats, added sugars, and salt. Get information about a proper diet from your health care provider, if necessary.  Regular physical exercise is one of the most important things you can do for your health. Most adults should get at least 150 minutes of moderate-intensity exercise (any activity that increases your heart rate and causes you to sweat) each week. In addition, most adults need muscle-strengthening exercises on 2 or more days a week.   Maintain a healthy weight. The body mass index (BMI) is a screening tool to identify possible weight problems. It provides an estimate of body fat based on height and weight. Your health care provider can find your BMI and can help you achieve or maintain a healthy weight. For males 20 years and older:  A BMI below 18.5 is considered underweight.  A BMI of 18.5 to 24.9 is  normal.  A BMI of 25 to 29.9 is considered overweight.  A BMI of 30 and above is considered obese.  Maintain normal blood lipids and cholesterol by exercising and minimizing your intake of saturated fat. Eat a balanced diet with plenty of fruits and vegetables. Blood tests for lipids and cholesterol should begin at age 44 and be repeated every 5 years. If your lipid or cholesterol levels are high, you are over 50, or you are at high risk for heart disease, you may need your cholesterol levels checked more frequently.Ongoing high lipid and cholesterol levels should be treated with medicines, if diet and exercise are not working.  If you smoke, find out from your health care provider how to quit. If you do not use tobacco, do not start.  Lung cancer screening is recommended for adults aged 31 80 years who are at high risk for developing lung cancer because of a history of smoking. A yearly low-dose CT scan of the lungs is recommended for people who have at least a 30-pack-year history of smoking and are a current smoker or have quit within the past 15 years. A pack year of smoking is smoking an average of 1 pack of cigarettes a day for 1 year (for example, a 30-pack-year history of smoking could mean smoking 1 pack a day for 30 years or 2 packs a day for 15 years). Yearly screening should continue until the smoker has stopped smoking for at least 15 years. Yearly screening should be stopped for people who develop a health problem that would prevent them from having lung cancer treatment.  If you choose to drink alcohol, do not have more than 2 drinks per day. One drink is considered to be 12 oz (360 mL) of beer, 5 oz (150 mL) of wine, or 1.5 oz (45 mL) of liquor.  Avoid use of street drugs. Do not share needles with anyone. Ask for help if you need support or instructions about stopping the use of drugs.  High blood pressure causes heart disease and increases the risk of stroke. Blood pressure should  be checked at least every 1 2 years. Ongoing high blood pressure should be treated with medicines if weight loss and exercise are not effective.  If you are 20 66 years old, ask your health care provider if you should take aspirin to prevent heart disease.  Diabetes screening involves taking a blood sample to check your fasting blood sugar level. This should  be done once every 3 years after age 50, if you are at a normal weight and without risk factors for diabetes. Testing should be considered at a younger age or be carried out more frequently if you are overweight and have at least 1 risk factor for diabetes.  Colorectal cancer can be detected and often prevented. Most routine colorectal cancer screening begins at the age of 68 and continues through age 87. However, your health care provider may recommend screening at an earlier age if you have risk factors for colon cancer. On a yearly basis, your health care provider may provide home test kits to check for hidden blood in the stool. A small camera at the end of a tube may be used to directly examine the colon (sigmoidoscopy or colonoscopy) to detect the earliest forms of colorectal cancer. Talk to your health care provider about this at age 18, when routine screening begins. A direct exam of the colon should be repeated every 5 10 years through age 34, unless early forms of pre-cancerous polyps or small growths are found.  People who are at an increased risk for hepatitis B should be screened for this virus. You are considered at high risk for hepatitis B if:  You were born in a country where hepatitis B occurs often. Talk with your health care provider about which countries are considered high-risk.  Your parents were born in a high-risk country and you have not received a shot to protect against hepatitis B (hepatitis B vaccine).  You have HIV or AIDS.  You use needles to inject street drugs.  You live with, or have sex with, someone who has  hepatitis B.  You are a man who has sex with other men (MSM).  You get hemodialysis treatment.  You take certain medicines for conditions like cancer, organ transplantation, and autoimmune conditions.  Hepatitis C blood testing is recommended for all people born from [birth year] through 1963 and any individual with known risk factors for hepatitis C.  Healthy men should no longer receive prostate-specific antigen (PSA) blood tests as part of routine cancer screening. Talk to your health care provider about prostate cancer screening.  Testicular cancer screening is not recommended for adolescents or adult males who have no symptoms. Screening includes self-exam, a health care provider exam, and other screening tests. Consult with your health care provider about any symptoms you have or any concerns you have about testicular cancer.  Practice safe sex. Use condoms and avoid high-risk sexual practices to reduce the spread of sexually transmitted infections (STIs).  Use sunscreen. Apply sunscreen liberally and repeatedly throughout the day. You should seek shade when your shadow is shorter than you. Protect yourself by wearing long sleeves, pants, a wide-brimmed hat, and sunglasses year round, whenever you are outdoors.  Tell your health care provider of new moles or changes in moles, especially if there is a change in shape or color. Also tell your provider if a mole is larger than the size of a pencil eraser.  A one-time screening for abdominal aortic aneurysm (AAA) and surgical repair of large AAAs by ultrasound is recommended for men aged 55 75 years who are current or former smokers.  Stay current with your vaccines (immunizations). Document Released: 03/28/2008 Document Revised: 07/21/2013 Document Reviewed: 02/25/2011 [facility] Patient Information 2014 [place], [place].

## 2013-12-16 ENCOUNTER — Telehealth: Payer: Self-pay

## 2013-12-16 MED ORDER — TRAMADOL HCL 50 MG PO TABS: 50.0000 mg | ORAL_TABLET | Freq: Four times a day (QID) | ORAL | Status: DC | PRN

## 2013-12-16 NOTE — Telephone Encounter (Signed)
Received fax request for refill on Tramadol.Dr.Jordan approved refill.Refill sent to pharmacy.

## 2013-12-27 ENCOUNTER — Ambulatory Visit (INDEPENDENT_AMBULATORY_CARE_PROVIDER_SITE_OTHER): Payer: BC Managed Care – PPO | Admitting: Nurse Practitioner

## 2013-12-27 ENCOUNTER — Encounter: Payer: Self-pay | Admitting: Nurse Practitioner

## 2013-12-27 VITALS — BP 110/76 | HR 72 | Ht 67.5 in | Wt 180.4 lb

## 2013-12-27 DIAGNOSIS — I200 Unstable angina: Secondary | ICD-10-CM

## 2013-12-27 DIAGNOSIS — I2510 Atherosclerotic heart disease of native coronary artery without angina pectoris: Secondary | ICD-10-CM

## 2013-12-27 DIAGNOSIS — I10 Essential (primary) hypertension: Secondary | ICD-10-CM

## 2013-12-27 DIAGNOSIS — R079 Chest pain, unspecified: Secondary | ICD-10-CM

## 2013-12-27 NOTE — Patient Instructions (Signed)
Continue with your current medicines  I will talk with Dr. Martinique about your cardiac cath films and see if he has any further recommendations  See Dr. Martinique in 4 months  Call the [place] office at [phone] if you have any questions, problems or concerns.

## 2013-12-27 NOTE — Progress Notes (Addendum)
Antonio [name] Date of Birth: 1946-06-25 Medical Record [id number]  History of Present Illness: Antonio [name] is seen back today for a post cath visit. Seen for Dr. Martinique. He has known CAD with remote CABG in 1996, last cath in February of 2014 showed patent grafts - noted inferior basal aneurysm. Other issues include HTN, HLD, past alcohol abuse, BPH, prostate cancer treated with radiation, and type 2 DM.   I saw him back in January - seemed to be doing ok - had had some chest tightness with walking but it tended to improve as he continued to walk - he was not limited and was happy with how he was doing.  Remained on chronic Effient. More of his issues centered around his blood sugar - having more trouble keeping it down.   Admitted back in February with chest pain - recathed - to continue with medical management - also had EGD and noted to have gastritis and had endorsed rectal bleeding.   Comes back today. Here alone. Doing well. No problems whatsoever. Has been back to his PCP - labs rechecked and were stable. A1C is elevated but he tells me that his sugars are now doing a bit better. No chest pain. Not short of breath. No problems with his groin. He is happy with how he is doing. Tolerating his medicines. No more bleeding. He is on his Effient.    Current Outpatient Prescriptions  Medication Sig Dispense Refill  . aspirin EC 81 MG tablet Take 81 mg by mouth daily.      . Insulin Glargine (LANTUS SOLOSTAR) 100 UNIT/ML Solostar Pen Inject 10 Units into the skin every morning.       . Insulin Pen Needle (BD PEN NEEDLE NANO U/F) 32G X 4 MM MISC 1 pen by Does not apply route daily.  30 each  11  . losartan-hydrochlorothiazide (HYZAAR) 100-25 MG per tablet Take 1 tablet by mouth daily.  30 tablet  0  . metoprolol (LOPRESSOR) 50 MG tablet Take 1 tablet (50 mg total) by mouth 2 (two) times daily.  60 tablet  6  . Multiple Vitamin (MULTIVITAMIN WITH MINERALS) TABS Take 1 tablet by mouth daily.       . nitroGLYCERIN (NITROSTAT) 0.4 MG SL tablet Place 1 tablet (0.4 mg total) under the tongue every 5 (five) minutes x 3 doses as needed for chest pain.  25 tablet  3  . pantoprazole (PROTONIX) 40 MG tablet Take 1 tablet (40 mg total) by mouth 2 (two) times daily.  60 tablet  0  . prasugrel (EFFIENT) 10 MG TABS tablet Take 10 mg by mouth daily.      . simvastatin (ZOCOR) 80 MG tablet Take 0.5 tablets (40 mg total) by mouth every morning.  30 tablet  6  . traMADol (ULTRAM) 50 MG tablet Take 1 tablet (50 mg total) by mouth every 6 (six) hours as needed.  30 tablet  1   No current facility-administered medications for this visit.    Allergies  Allergen Reactions  . Demerol Other (See Comments)    hallucinations  . Meperidine And Related     Past Medical History  Diagnosis Date  . 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 HYPERCHOLESTEROLEMIA 12/17/2010  . HYPERTENSION 12/17/2010  . CAD     a. s/p CABG 1996 - last cath 08/2013 - patent grafts.  Marland Kitchen BENIGN PROSTATIC HYPERTROPHY, WITH OBSTRUCTION 12/17/2010  . Prostate cancer     s/p radiation   . Myocardial  infarction 1996; 2006  . Type II diabetes mellitus 12/17/2010  . Bleeding per rectum     "related to prostate cancer and hemorrhoids" (09/03/2012)  . GERD (gastroesophageal reflux disease)   . Urine incontinence   . Chicken pox     Past Surgical History  Procedure Laterality Date  . Cardiac catheterization  01/27/2009    ef 60%  . Coronary artery bypass graft  1996    LIMA GRAFT TO THE LAD, SAPHENOUS VEIN GRAFT SEQUENTIALLY TO THE FIRST AND SECOND OBTUSE MARGINAL VESSELS, SAPHENOUS VEIN GRAFT TO THE DIAGONAL, AND SAPHENOUS VEIN GRAFT TO THE DISTAL RIGHT CORONARY   . Laceration repair  1980's    LEFT HAND  . Foot surgery  1980's    LEFT, "shot a nail gun thru it" (09/03/2012)  . Cervical disc surgery  ? 1990's    "went in on the side" (09/03/2012)  . Cardiac catheterization  08/25/2012    Severe 3v obstructive CAD, continued graft  patency (SVG-D,  SVG-OM1-OM2, SVG-PDA, LIMA-LAD). area of diffuse dz in the distal LCx (up to 90%) unamenable to PCI  . Esophagogastroduodenoscopy Left 11/19/2013    Procedure: ESOPHAGOGASTRODUODENOSCOPY (EGD);  Surgeon: Arta Silence, MD;  Location[facility] ENDOSCOPY;  Service: Endoscopy;  Laterality: Left;    History  Smoking status  . Former Smoker -- 1.00 packs/day for 37 years  . Types: Cigarettes  . Quit date: 10/14/2002  Smokeless tobacco  . Never Used    History  Alcohol Use No    Family History  Problem Relation Age of Onset  . Diabetes Other     2 siblings, 1 of whom is deceased  . Hypertension Other   . Cancer Other     Lung Cancer  . Cancer Other     FH of Prostate Cancer 1st degree relative  . Cancer Mother     breast  . Hypertension Mother   . Hypertension Father     Review of Systems: The review of systems is per the HPI.  All other systems were reviewed and are negative.  Physical Exam: BP 110/76  Pulse 72  Ht 5' 7.5" (1.715 m)  Wt 180 lb 6.4 oz (81.829 kg)  BMI 27.82 kg/m2 Patient is very pleasant and in no acute distress. Skin is warm and dry. Color is normal.  HEENT is unremarkable. Normocephalic/atraumatic. PERRL. Sclera are nonicteric. Neck is supple. No masses. No JVD. Lungs are clear. Cardiac exam shows a regular rate and rhythm. Split S2 noted. Abdomen is soft. Extremities are without edema. Gait and ROM are intact. No gross neurologic deficits noted.  LABORATORY DATA:  Lab Results  Component Value Date   WBC 6.4 12/14/2013   HGB 13.0 12/14/2013   HCT 39.9 12/14/2013   PLT 149.0* 12/14/2013   GLUCOSE 100* 12/14/2013   CHOL 150 12/14/2013   TRIG 71.0 12/14/2013   HDL 43.80 12/14/2013   LDLCALC 92 12/14/2013   ALT 20 12/14/2013   AST 21 12/14/2013   NA 138 12/14/2013   K 3.6 12/14/2013   CL 102 12/14/2013   CREATININE 1.4 12/14/2013   BUN 11 12/14/2013   CO2 26 12/14/2013   TSH 1.097 11/17/2013   INR 0.97 11/21/2013   HGBA1C 10.7* 12/14/2013   MICROALBUR 2.1* 12/14/2013      ANGIOGRAPHIC DATA: The left main coronary artery is widely patent.  The left anterior descending artery is occluded in the midportion of the vessel. The mid to distal LAD fills by widely patent LIMA to LAD.  There is mild diffuse disease in the mid to distal LAD. The proximal vessel has moderate disease and moderate calcification.  The LIMA to LAD is widely patent.  The SVG to diagonal is widely patent  The left circumflex artery is a large vessel proximally. In the proximal vessel, there is a moderate, 50% stenosis. There is a large atrial branch which originates and is widely patent. There is an OM with significant, severe diffuse disease. The circumflex vessel is quite small.  The jump graft SVG to OM 1 and OM 2 is widely patent  The right coronary artery is a medium size vessel. The mid vessel is occluded. There are bridging collaterals providing some antegrade flow.  The SVG to PDA is widely patent.  LEFT VENTRICULOGRAM: Left ventricular angiogram was done in the 30 RAO projection and revealed normal left ventricular wall motion and systolic function with an estimated ejection fraction of 55 %. LVEDP was 15 mmHg. 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 This was present on the ventriculogram done in 2010.  IMPRESSIONS:  1. Widely patent left main coronary artery. 2. Occluded mid left anterior descending artery. Patent LIMA to LAD. 3. Severely diseased mid left circumflex artery and its OM branches. The jump graft SVG to OM 1 and OM 2 is widely patent 4. Occluded mid right coronary artery. The SVG to PDA is widely patent 5. Normal left ventricular systolic function. 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 Ejection fraction 55%. RECOMMENDATION: Continue medical therapy. Grafts appear unchanged. The patient will followup with Dr. Martinique. Unclear if any management is required for this basal inferior aneurysm. Consider MRI. It has been present for at least 5 years.   Endoscopy;  Normal esophagus. Mild diffuse gastritis, otherwise  normal stomach and pylorus. Normal duodenum to the second portion.  No old or fresh blood seen to the extent of our examination.  The scope was then withdrawn from the patient and the procedure  completed.  Assessment / Plan: 1. S/P cardiac cath - grafts patent and study was felt to be unchanged. Will ask Dr. Martinique to review regarding the basal inferior aneurysm and consideration of MRI - tentatively see back as planned  2. Gastritis noted on EGD - on PPI therapy - no further issues reported.   3. HTN - BP looks good.  4. HLD - on statin therapy  5. DM  See him back in 4 months. No change with current therapy.  Patient is agreeable to this plan and will call if any problems develop in the interim.   Burtis Junes, RN, [place] [address] 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 I wouldn't bother with it now.   [name] ----- Message -----    From: Burtis Junes, NP    Sent: 12/27/2013   9:08 AM      To: Peter M Martinique, MD  [name], Do you think he needs MRI?

## 2013-12-28 ENCOUNTER — Telehealth: Payer: Self-pay

## 2013-12-28 NOTE — Telephone Encounter (Signed)
Spoke to patient received message from [name] NP she advised does not need any further testing.Advised to follow up with Dr.Jordan as planned.

## 2013-12-30 ENCOUNTER — Encounter: Payer: BC Managed Care – PPO | Attending: Physician Assistant | Admitting: *Deleted

## 2013-12-30 ENCOUNTER — Encounter: Payer: Self-pay | Admitting: *Deleted

## 2013-12-30 VITALS — Ht 67.5 in | Wt 180.6 lb

## 2013-12-30 DIAGNOSIS — Z713 Dietary counseling and surveillance: Secondary | ICD-10-CM | POA: Insufficient documentation

## 2013-12-30 DIAGNOSIS — E119 Type 2 diabetes mellitus without complications: Secondary | ICD-10-CM | POA: Insufficient documentation

## 2013-12-30 NOTE — Progress Notes (Signed)
Appt start time: 1500 end time:  1630.  Assessment:  Patient was seen on  12/30/13 for individual diabetes education. Patient here with his wife, states history of diabetes for 2 years. He states he SMBG 2 / day with reported range of 70 - 160 mg/dl, usually 90 - 150 mg/dl. 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 He enjoys fishing and doing things with his hands like woodworking and [employer]. He has not had any diabetes education before today.  Current HbA1c: 10.7%  Preferred Learning Style:  No preference indicated   Learning Readiness:   Ready  Change in progress  MEDICATIONS: see list, diabetes medication is 10 units Lantus once a day at 10 AM  DIETARY INTAKE:  24-hr recall:  B ( AM): cereal, banana OR eggs, bacon or sausage, grits, toast with jelly, water or coffee with cream and Splenda occasionally  Snk ( AM): sleeps after night shift  L (5 PM): left over hot meal, lots of vegetables  Snk ( PM): fresh fruit or raw vegetables or vending cookies D (3 AM): hot vegetable plate, water Snk ( PM): no Beverages: water  Usual physical activity: not in the winter, but in nicer weather he enjoys outdoor activities  Estimated energy needs: 1600 calories 180 g carbohydrates 120 g protein 44 g fat  Intervention:  Nutrition counseling provided.  Discussed diabetes disease process and treatment options.  Discussed physiology of diabetes and role of obesity on insulin resistance.  Encouraged moderate weight reduction to improve glucose levels.  Discussed role of medications and diet in glucose control  Provided education on macronutrients on glucose levels.  Provided education on carb counting, importance of regularly scheduled meals/snacks, and meal planning  Discussed effects of physical activity on glucose levels and long-term glucose control.  Recommended 150 minutes of physical activity/week.  Reviewed patient medications.   Discussed role of medication on blood glucose and possible side effects  Discussed blood glucose monitoring and interpretation.  Discussed recommended target ranges and individual ranges.    Described short-term complications: hyper- and hypo-glycemia.  Discussed causes,symptoms, and treatment options.  Discussed prevention, detection, and treatment of long-term complications.  Discussed the role of prolonged elevated glucose levels on body systems.  Discussed role of stress on blood glucose levels and discussed strategies to manage psychosocial issues.  Discussed recommendations for long-term diabetes self-care.  Provideded checklist for medical, dental, and emotional self-care.  Plan:  Aim for 3 Carb Choices per meal (45 grams) +/- 1 either way  Aim for 0-2 Carbs per snack if hungry  Consider reading food labels for Total Carbohydrate of foods Consider  increasing your activity level by walking as tolerated Continue checking BG at alternate times per day as directed by MD  Continue taking medication of Lantus at 10 AM as directed by MD  Teaching Method Utilized: Visual, Auditory and Hands on  Handouts given during visit include: Living Well with Diabetes Carb Counting and Food Label handouts Meal Plan Card  Barriers to learning/adherence to lifestyle change: none  Diabetes self-care support plan:   [facility] support group  Demonstrated degree of understanding via:  Teach Back   Monitoring/Evaluation:  Dietary intake, exercise, reading food labels, and body weight prn.

## 2013-12-30 NOTE — Patient Instructions (Signed)
Plan:  Aim for 3 Carb Choices per meal (45 grams) +/- 1 either way  Aim for 0-2 Carbs per snack if hungry  Consider reading food labels for Total Carbohydrate of foods Consider  increasing your activity level by walking as tolerated Continue checking BG at alternate times per day as directed by MD  Continue taking medication of Lantus at 10 AM as directed by MD

## 2013-12-31 ENCOUNTER — Other Ambulatory Visit: Payer: Self-pay | Admitting: Physician Assistant

## 2013-12-31 ENCOUNTER — Telehealth: Payer: Self-pay

## 2013-12-31 DIAGNOSIS — E119 Type 2 diabetes mellitus without complications: Secondary | ICD-10-CM

## 2013-12-31 MED ORDER — SITAGLIP PHOS-METFORMIN HCL ER 100-1000 MG PO TB24: 1.0000 | ORAL_TABLET | Freq: Once | ORAL | Status: DC

## 2013-12-31 NOTE — Telephone Encounter (Signed)
Phone call from patient today at 8:15 am. He states on his last office visit with you he received samples of janumet 100/1000 mg. He is requesting a rx be sent to his pharmacy, [facility] on [address].

## 2013-12-31 NOTE — Telephone Encounter (Signed)
Please phone patient and inform him have sent refills to his pharmacy. Samples available if necessary.

## 2014-01-06 ENCOUNTER — Encounter: Payer: Self-pay | Admitting: Podiatrist

## 2014-01-06 ENCOUNTER — Ambulatory Visit (INDEPENDENT_AMBULATORY_CARE_PROVIDER_SITE_OTHER): Payer: BC Managed Care – PPO | Admitting: Podiatrist

## 2014-01-06 VITALS — BP 112/66 | HR 83 | Resp 18 | Ht 67.0 in | Wt 180.0 lb

## 2014-01-06 DIAGNOSIS — B351 Tinea unguium: Secondary | ICD-10-CM

## 2014-01-06 DIAGNOSIS — I200 Unstable angina: Secondary | ICD-10-CM

## 2014-01-06 DIAGNOSIS — M79609 Pain in unspecified limb: Secondary | ICD-10-CM

## 2014-01-06 NOTE — Progress Notes (Deleted)
Nails x 10 bilat first mildly ingrown.  Rectus foot. Adduct of varus 5th bilat.  Otherwise good

## 2014-01-06 NOTE — Patient Instructions (Signed)
Diabetes and Foot Care Diabetes may cause you to have problems because of poor blood supply (circulation) to your feet and legs. This may cause the skin on your feet to become thinner, break easier, and heal more slowly. Your skin may become dry, and the skin may peel and crack. You may also have nerve damage in your legs and feet causing decreased feeling in them. You may not notice minor injuries to your feet that could lead to infections or more serious problems. Taking care of your feet is one of the most important things you can do for yourself.  HOME CARE INSTRUCTIONS  Wear shoes at all times, even in the house. Do not go barefoot. Bare feet are easily injured.  Check your feet daily for blisters, cuts, and redness. If you cannot see the bottom of your feet, use a mirror or ask someone for help.  Wash your feet with warm water (do not use hot water) and mild soap. Then pat your feet and the areas between your toes until they are completely dry. Do not soak your feet as this can dry your skin.  Apply a moisturizing lotion or petroleum jelly (that does not contain alcohol and is unscented) to the skin on your feet and to dry, brittle toenails. Do not apply lotion between your toes.  Trim your toenails straight across. Do not dig under them or around the cuticle. File the edges of your nails with an emery board or nail file.  Do not cut corns or calluses or try to remove them with medicine.  Wear clean socks or stockings every day. Make sure they are not too tight. Do not wear knee-high stockings since they may decrease blood flow to your legs.  Wear shoes that fit properly and have enough cushioning. To break in new shoes, wear them for just a few hours a day. This prevents you from injuring your feet. Always look in your shoes before you put them on to be sure there are no objects inside.  Do not cross your legs. This may decrease the blood flow to your feet.  If you find a minor scrape,  cut, or break in the skin on your feet, keep it and the skin around it clean and dry. These areas may be cleansed with mild soap and water. Do not cleanse the area with peroxide, alcohol, or iodine.  When you remove an adhesive bandage, be sure not to damage the skin around it.  If you have a wound, look at it several times a day to make sure it is healing.  Do not use heating pads or hot water bottles. They may burn your skin. If you have lost feeling in your feet or legs, you may not know it is happening until it is too late.  Make sure your health care provider performs a complete foot exam at least annually or more often if you have foot problems. Report any cuts, sores, or bruises to your health care provider immediately. SEEK MEDICAL CARE IF:   You have an injury that is not healing.  You have cuts or breaks in the skin.  You have an ingrown nail.  You notice redness on your legs or feet.  You feel burning or tingling in your legs or feet.  You have pain or cramps in your legs and feet.  Your legs or feet are numb.  Your feet always feel cold. SEEK IMMEDIATE MEDICAL CARE IF:   There is increasing redness,   swelling, or pain in or around a wound.  There is a red line that goes up your leg.  Pus is coming from a wound.  You develop a fever or as directed by your health care provider.  You notice a bad smell coming from an ulcer or wound. Document Released: 09/27/2000 Document Revised: 06/02/2013 Document Reviewed: 03/09/2013 Exit[employer] Patient Information 2014 [employer].  

## 2014-01-06 NOTE — Progress Notes (Signed)
   Subjective:    Patient ID: Antonio [name], male    DOB: 1946/07/25, 68 y.o.   MRN: [id number]  HPI Comments: To have my feet checked.  I am diabetic. Nails are long and thick and painful. Diabetic for about 2 years. Last a1c was 10   Diabetes Hypoglycemia symptoms include dizziness. Associated symptoms include weakness.      Review of Systems  Constitutional: Positive for unexpected weight change.  Gastrointestinal: Positive for blood in stool.  Endocrine:       Diabetes  Increased urination   Musculoskeletal: Positive for back pain.  Skin:       Bruise easily  Neurological: Positive for dizziness, weakness and light-headedness.  All other systems reviewed and are negative.       Objective:   Physical Exam GENERAL APPEARANCE: Alert, conversant. Appropriately groomed. No acute distress.  VASCULAR: Pedal pulses palpable at 2/4 DP and PT bilateral.  Capillary refill time is immediate to all digits,  Proximal to distal cooling it warm to warm.  Digital hair growth is present bilateral  NEUROLOGIC: sensation is intact epicritically and protectively to 5.07 monofilament at 5/5 sites bilateral.  Light touch is intact bilateral, vibratory sensation intact bilateral, achilles tendon reflex is intact bilateral.  MUSCULOSKELETAL: Adductovarus deformity is noted fifth digits bilaterally. acceptable muscle strength, tone and stability bilateral.  Intrinsic muscluature intact bilateral.  Rectus appearance of foot noted bilateral.   DERMATOLOGIC: Toenails are elongated thickened, discolored, dystrophic and clinically mycotic. Bilateral hallux nails and mildly ingrown and uncomfortable. Otherwise skin color, texture, and turger are within normal limits.  No preulcerative lesions are seen, no interdigital maceration noted.  No open lesions present.          Assessment & Plan:

## 2014-01-12 ENCOUNTER — Encounter: Payer: BC Managed Care – PPO | Admitting: Cardiology

## 2014-01-13 ENCOUNTER — Encounter: Payer: BC Managed Care – PPO | Admitting: Cardiology

## 2014-01-14 ENCOUNTER — Encounter (HOSPITAL_COMMUNITY): Payer: Self-pay | Admitting: Certified Registered"

## 2014-01-28 ENCOUNTER — Ambulatory Visit (INDEPENDENT_AMBULATORY_CARE_PROVIDER_SITE_OTHER): Payer: BC Managed Care – PPO | Admitting: Internal Medicine

## 2014-01-28 ENCOUNTER — Encounter: Payer: Self-pay | Admitting: Internal Medicine

## 2014-01-28 VITALS — BP 110/78 | HR 72 | Ht 67.25 in | Wt 181.8 lb

## 2014-01-28 DIAGNOSIS — C61 Malignant neoplasm of prostate: Secondary | ICD-10-CM | POA: Insufficient documentation

## 2014-01-28 DIAGNOSIS — I200 Unstable angina: Secondary | ICD-10-CM

## 2014-01-28 DIAGNOSIS — Z23 Encounter for immunization: Secondary | ICD-10-CM

## 2014-01-28 DIAGNOSIS — E119 Type 2 diabetes mellitus without complications: Secondary | ICD-10-CM

## 2014-01-28 DIAGNOSIS — E7800 Pure hypercholesterolemia, unspecified: Secondary | ICD-10-CM

## 2014-01-28 DIAGNOSIS — I10 Essential (primary) hypertension: Secondary | ICD-10-CM

## 2014-01-28 MED ORDER — SITAGLIP PHOS-METFORMIN HCL ER 100-1000 MG PO TB24: 1.0000 | ORAL_TABLET | Freq: Once | ORAL | Status: DC

## 2014-01-28 MED ORDER — PANTOPRAZOLE SODIUM 40 MG PO TBEC: 40.0000 mg | DELAYED_RELEASE_TABLET | Freq: Two times a day (BID) | ORAL | Status: DC

## 2014-01-28 NOTE — Patient Instructions (Addendum)
You had the new Prevnar pneumonia shot today  Please continue all other medications as before, and refills have been done if requested. Please have the pharmacy call with any other refills you may need.  Please continue your efforts at being more active, low cholesterol diet, and weight control. You are otherwise up to date with prevention measures today.  No lab work needed today  Please remember to sign up for MyChart if you have not done so, as this will be important to you in the future with finding out test results, communicating by private email, and scheduling acute appointments online when needed.  Please return in 3 months, or sooner if needed

## 2014-01-28 NOTE — Progress Notes (Signed)
Pre visit review using our clinic review tool, if applicable. No additional management support is needed unless otherwise documented below in the visit note. 

## 2014-01-28 NOTE — Assessment & Plan Note (Signed)
stable overall by history and exam, recent data reviewed with pt, and pt to continue medical treatment as before,  to f/u any worsening symptoms or concerns BP Readings from Last 3 Encounters:  01/28/14 110/78  01/06/14 112/66  12/27/13 110/76

## 2014-01-28 NOTE — Progress Notes (Signed)
Subjective:    Patient ID: Antonio [name], male    DOB: 1946/08/29, 68 y.o.   MRN: [id number]  HPI Here for yearly f/u, former pt of Ms Hartman/NP;  Overall doing ok;  Pt denies CP, worsening SOB, DOE, wheezing, orthopnea, PND, worsening LE edema, palpitations, dizziness or syncope.  Pt denies neurological change such as new headache, facial or extremity weakness.  Pt denies polydipsia, polyuria, or low sugar symptoms. Pt states overall good compliance with treatment and medications, good tolerability, and has been trying to follow lower cholesterol diet.  Pt denies worsening depressive symptoms, suicidal ideation or panic. No fever, night sweats, wt loss, loss of appetite, or other constitutional symptoms.  Pt states good ability with ADL's, has low fall risk, home safety reviewed and adequate, no other significant changes in hearing or vision, and only occasionally active with exercise. Last a1c over 10 but some improved from previous.  States better compliance with meds and diet now. Past Medical History  Diagnosis Date  . 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 HYPERCHOLESTEROLEMIA 12/17/2010  . HYPERTENSION 12/17/2010  . CAD     a. s/p CABG 1996 - last cath 08/2013 - patent grafts.  Marland Kitchen BENIGN PROSTATIC HYPERTROPHY, WITH OBSTRUCTION 12/17/2010  . Prostate cancer     s/p radiation   . Myocardial infarction 1996; 2006  . Type II diabetes mellitus 12/17/2010  . Bleeding per rectum     "related to prostate cancer and hemorrhoids" (09/03/2012)  . GERD (gastroesophageal reflux disease)   . Urine incontinence   . Chicken pox    Past Surgical History  Procedure Laterality Date  . Cardiac catheterization  01/27/2009    ef 60%  . Coronary artery bypass graft  1996    LIMA GRAFT TO THE LAD, SAPHENOUS VEIN GRAFT SEQUENTIALLY TO THE FIRST AND SECOND OBTUSE MARGINAL VESSELS, SAPHENOUS VEIN GRAFT TO THE DIAGONAL, AND SAPHENOUS VEIN GRAFT TO THE DISTAL RIGHT CORONARY   . Laceration repair  1980's    LEFT  HAND  . Foot surgery  1980's    LEFT, "shot a nail gun thru it" (09/03/2012)  . Cervical disc surgery  ? 1990's    "went in on the side" (09/03/2012)  . Cardiac catheterization  08/25/2012    Severe 3v obstructive CAD, continued graft patency (SVG-D,  SVG-OM1-OM2, SVG-PDA, LIMA-LAD). area of diffuse dz in the distal LCx (up to 90%) unamenable to PCI  . Esophagogastroduodenoscopy Left 11/19/2013    Procedure: ESOPHAGOGASTRODUODENOSCOPY (EGD);  Surgeon: Arta Silence, MD;  Location: [facility] ENDOSCOPY;  Service: Endoscopy;  Laterality: Left;    reports that he quit smoking about 11 years ago. His smoking use included Cigarettes. He has a 37 pack-year smoking history. He has never used smokeless tobacco. He reports that he does not drink alcohol or use illicit drugs. family history includes Cancer in his mother, other, and other; Diabetes in his other; Hypertension in his father, mother, and other. Allergies  Allergen Reactions  . Demerol Other (See Comments)    hallucinations  . Meperidine And Related    Current Outpatient Prescriptions on File Prior to Visit  Medication Sig Dispense Refill  . aspirin EC 81 MG tablet Take 81 mg by mouth daily.      . Insulin Glargine (LANTUS SOLOSTAR) 100 UNIT/ML Solostar Pen Inject 10 Units into the skin every morning.       . Insulin Pen Needle (BD PEN NEEDLE NANO U/F) 32G X 4 MM MISC 1 pen by Does not apply  route daily.  30 each  11  . losartan-hydrochlorothiazide (HYZAAR) 100-25 MG per tablet Take 1 tablet by mouth daily.  30 tablet  0  . metoprolol (LOPRESSOR) 50 MG tablet Take 1 tablet (50 mg total) by mouth 2 (two) times daily.  60 tablet  6  . Multiple Vitamin (MULTIVITAMIN WITH MINERALS) TABS Take 1 tablet by mouth daily.      . nitroGLYCERIN (NITROSTAT) 0.4 MG SL tablet Place 1 tablet (0.4 mg total) under the tongue every 5 (five) minutes x 3 doses as needed for chest pain.  25 tablet  3  . prasugrel (EFFIENT) 10 MG TABS tablet Take 10 mg by mouth daily.       . simvastatin (ZOCOR) 80 MG tablet Take 0.5 tablets (40 mg total) by mouth every morning.  30 tablet  6  . traMADol (ULTRAM) 50 MG tablet Take 1 tablet (50 mg total) by mouth every 6 (six) hours as needed.  30 tablet  1   No current facility-administered medications on file prior to visit.   Review of Systems Constitutional: Negative for diaphoresis, activity change, appetite change or unexpected weight change.  HENT: Negative for hearing loss, ear pain, facial swelling, mouth sores and neck stiffness.   Eyes: Negative for pain, redness and visual disturbance.  Respiratory: Negative for shortness of breath and wheezing.   Cardiovascular: Negative for chest pain and palpitations.  Gastrointestinal: Negative for diarrhea, blood in stool, abdominal distention or other pain Genitourinary: Negative for hematuria, flank pain or change in urine volume.  Musculoskeletal: Negative for myalgias and joint swelling.  Skin: Negative for color change and wound.  Neurological: Negative for syncope and numbness. other than noted Hematological: Negative for adenopathy.  Psychiatric/Behavioral: Negative for hallucinations, self-injury, decreased concentration and agitation.      Objective:   Physical Exam BP 110/78  Pulse 72  Ht 5' 7.25" (1.708 m)  Wt 181 lb 12 oz (82.441 kg)  BMI 28.26 kg/m2 VS noted,  Constitutional: Pt is oriented to person, place, and time. Appears well-developed and well-nourished.  Head: Normocephalic and atraumatic.  Right Ear: External ear normal.  Left Ear: External ear normal.  Nose: Nose normal.  Mouth/Throat: Oropharynx is clear and moist.  Eyes: Conjunctivae and EOM are normal. Pupils are equal, round, and reactive to light.  Neck: Normal range of motion. Neck supple. No JVD present. No tracheal deviation present.  Cardiovascular: Normal rate, regular rhythm, normal heart sounds and intact distal pulses.   Pulmonary/Chest: Effort normal and breath sounds normal.    Abdominal: Soft. Bowel sounds are normal. There is no tenderness. No HSM  Musculoskeletal: Normal range of motion. Exhibits no edema.  Lymphadenopathy:  Has no cervical adenopathy.  Neurological: Pt is alert and oriented to person, place, and time. Pt has normal reflexes. No cranial nerve deficit.  Skin: Skin is warm and dry. No rash noted.  Psychiatric:  Has  normal mood and affect. Behavior is normal.      Assessment & Plan:

## 2014-01-28 NOTE — Assessment & Plan Note (Signed)
stable overall by history and exam, recent data reviewed with pt, and pt to continue medical treatment as before,  to f/u any worsening symptoms or concerns, overall goal ldl < 90, cont diet and current med, f/u lab next visit Lab Results  Component Value Date   LDLCALC 92 12/14/2013

## 2014-01-28 NOTE — Assessment & Plan Note (Signed)
stable overall by history and exam, recent data reviewed with pt, and pt to continue medical treatment as before,  to f/u any worsening symptoms or concerns Lab Results  Component Value Date   HGBA1C 10.7* 12/14/2013   States better complance, with recent lantus and orals intensification - cont as is, f/u next visit

## 2014-01-31 ENCOUNTER — Telehealth: Payer: Self-pay | Admitting: *Deleted

## 2014-01-31 ENCOUNTER — Telehealth: Payer: Self-pay | Admitting: Internal Medicine

## 2014-01-31 NOTE — Telephone Encounter (Signed)
Patient requests effient samples. I will place at the front desk for pick up. 

## 2014-01-31 NOTE — Telephone Encounter (Signed)
Relevant patient education assigned to patient using Emmi. ° °

## 2014-02-09 ENCOUNTER — Telehealth: Payer: Self-pay

## 2014-02-09 NOTE — Telephone Encounter (Signed)
Relevant patient education mailed to patient.  

## 2014-02-25 ENCOUNTER — Encounter: Payer: Self-pay | Admitting: Cardiology

## 2014-02-25 ENCOUNTER — Ambulatory Visit (INDEPENDENT_AMBULATORY_CARE_PROVIDER_SITE_OTHER): Payer: BC Managed Care – PPO | Admitting: Cardiology

## 2014-02-25 VITALS — BP 119/72 | HR 67 | Ht 67.0 in | Wt 181.0 lb

## 2014-02-25 DIAGNOSIS — I2510 Atherosclerotic heart disease of native coronary artery without angina pectoris: Secondary | ICD-10-CM

## 2014-02-25 DIAGNOSIS — I200 Unstable angina: Secondary | ICD-10-CM

## 2014-02-25 DIAGNOSIS — E7800 Pure hypercholesterolemia, unspecified: Secondary | ICD-10-CM

## 2014-02-25 DIAGNOSIS — I10 Essential (primary) hypertension: Secondary | ICD-10-CM

## 2014-02-25 MED ORDER — METOPROLOL TARTRATE 25 MG PO TABS: 25.0000 mg | ORAL_TABLET | Freq: Every day | ORAL | Status: DC

## 2014-02-25 NOTE — Progress Notes (Signed)
Antonio [name] Date of Birth: 1945-12-06 Medical Record #[id number]  History of Present Illness: Antonio [name] is seen back today for a follow up visit. He has known CAD with remote CABG in 1996, last cath in February of 2014 showed patent grafts - noted inferior basal aneurysm. Other issues include HTN, HLD, past alcohol abuse, BPH, prostate cancer treated with radiation, and type 2 DM.   On follow up today he is doing well. Denies any recurrent chest pain. His major complaint is that he just feel like doing much anymore and is more fatigued. No SOB.   Current Outpatient Prescriptions  Medication Sig Dispense Refill  . aspirin EC 81 MG tablet Take 81 mg by mouth daily.      . Insulin Glargine (LANTUS SOLOSTAR) 100 UNIT/ML Solostar Pen Inject 10 Units into the skin every morning.       . Insulin Pen Needle (BD PEN NEEDLE NANO U/F) 32G X 4 MM MISC 1 pen by Does not apply route daily.  30 each  11  . losartan-hydrochlorothiazide (HYZAAR) 100-25 MG per tablet Take 1 tablet by mouth daily.  30 tablet  0  . Multiple Vitamin (MULTIVITAMIN WITH MINERALS) TABS Take 1 tablet by mouth daily.      . nitroGLYCERIN (NITROSTAT) 0.4 MG SL tablet Place 1 tablet (0.4 mg total) under the tongue every 5 (five) minutes x 3 doses as needed for chest pain.  25 tablet  3  . pantoprazole (PROTONIX) 40 MG tablet Take 1 tablet (40 mg total) by mouth 2 (two) times daily.  60 tablet  11  . prasugrel (EFFIENT) 10 MG TABS tablet Take 10 mg by mouth daily.      . simvastatin (ZOCOR) 80 MG tablet Take 0.5 tablets (40 mg total) by mouth every morning.  30 tablet  6  . SitaGLIPtin-MetFORMIN HCl [id number] MG TB24 Take 1 tablet by mouth once.  30 tablet  11  . traMADol (ULTRAM) 50 MG tablet Take 1 tablet (50 mg total) by mouth every 6 (six) hours as needed.  30 tablet  1  . metoprolol tartrate (LOPRESSOR) 25 MG tablet Take 1 tablet (25 mg total) by mouth daily.  90 tablet  3   No current facility-administered medications for  this visit.    Allergies  Allergen Reactions  . Demerol Other (See Comments)    hallucinations  . Meperidine And Related     Past Medical History  Diagnosis Date  . 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 HYPERCHOLESTEROLEMIA 12/17/2010  . HYPERTENSION 12/17/2010  . CAD     a. s/p CABG 1996 - last cath 08/2013 - patent grafts.  Marland Kitchen BENIGN PROSTATIC HYPERTROPHY, WITH OBSTRUCTION 12/17/2010  . Prostate cancer     s/p radiation   . Myocardial infarction 1996; 2006  . Type II diabetes mellitus 12/17/2010  . Bleeding per rectum     "related to prostate cancer and hemorrhoids" (09/03/2012)  . GERD (gastroesophageal reflux disease)   . Urine incontinence   . Chicken pox     Past Surgical History  Procedure Laterality Date  . Cardiac catheterization  01/27/2009    ef 60%  . Coronary artery bypass graft  1996    LIMA GRAFT TO THE LAD, SAPHENOUS VEIN GRAFT SEQUENTIALLY TO THE FIRST AND SECOND OBTUSE MARGINAL VESSELS, SAPHENOUS VEIN GRAFT TO THE DIAGONAL, AND SAPHENOUS VEIN GRAFT TO THE DISTAL RIGHT CORONARY   . Laceration repair  1980's    LEFT HAND  . Foot surgery  1980's    LEFT, "shot a nail gun thru it" (09/03/2012)  . Cervical disc surgery  ? 1990's    "went in on the side" (09/03/2012)  . Cardiac catheterization  08/25/2012    Severe 3v obstructive CAD, continued graft patency (SVG-D,  SVG-OM1-OM2, SVG-PDA, LIMA-LAD). area of diffuse dz in the distal LCx (up to 90%) unamenable to PCI  . Esophagogastroduodenoscopy Left 11/19/2013    Procedure: ESOPHAGOGASTRODUODENOSCOPY (EGD);  Surgeon: Arta Silence, MD;  Location: [facility] ENDOSCOPY;  Service: Endoscopy;  Laterality: Left;    History  Smoking status  . Former Smoker -- 1.00 packs/day for 37 years  . Types: Cigarettes  . Quit date: 10/14/2002  Smokeless tobacco  . Never Used    History  Alcohol Use No    Comment: Rare    Family History  Problem Relation Age of Onset  . Diabetes Other     2 siblings, 1 of whom is deceased  .  Hypertension Other   . Cancer Other     Lung Cancer  . Cancer Other     FH of Prostate Cancer 1st degree relative  . Cancer Mother     breast  . Hypertension Mother   . Hypertension Father     Review of Systems: The review of systems is per the HPI.  All other systems were reviewed and are negative.  Physical Exam: BP 119/72  Pulse 67  Ht 5\' 7"  (1.702 m)  Wt 181 lb (82.101 kg)  BMI 28.34 kg/m2 Patient is very pleasant and in no acute distress. Skin is warm and dry. Color is normal.  HEENT is unremarkable. Normocephalic/atraumatic. PERRL. Sclera are nonicteric. Neck is supple. No masses. No JVD. Lungs are clear. Cardiac exam shows a regular rate and rhythm. Split S2 noted. Abdomen is soft. Extremities are without edema. Gait and ROM are intact. No gross neurologic deficits noted.  LABORATORY DATA:  Lab Results  Component Value Date   WBC 6.4 12/14/2013   HGB 13.0 12/14/2013   HCT 39.9 12/14/2013   PLT 149.0* 12/14/2013   GLUCOSE 100* 12/14/2013   CHOL 150 12/14/2013   TRIG 71.0 12/14/2013   HDL 43.80 12/14/2013   LDLCALC 92 12/14/2013   ALT 20 12/14/2013   AST 21 12/14/2013   NA 138 12/14/2013   K 3.6 12/14/2013   CL 102 12/14/2013   CREATININE 1.4 12/14/2013   BUN 11 12/14/2013   CO2 26 12/14/2013   TSH 1.097 11/17/2013   INR 0.97 11/21/2013   HGBA1C 10.7* 12/14/2013   MICROALBUR 2.1* 12/14/2013     ANGIOGRAPHIC DATA: The left main coronary artery is widely patent.  The left anterior descending artery is occluded in the midportion of the vessel. The mid to distal LAD fills by widely patent LIMA to LAD. There is mild diffuse disease in the mid to distal LAD. The proximal vessel has moderate disease and moderate calcification.  The LIMA to LAD is widely patent.  The SVG to diagonal is widely patent  The left circumflex artery is a large vessel proximally. In the proximal vessel, there is a moderate, 50% stenosis. There is a large atrial branch which originates and is widely patent. There is an OM with  significant, severe diffuse disease. The circumflex vessel is quite small.  The jump graft SVG to OM 1 and OM 2 is widely patent  The right coronary artery is a medium size vessel. The mid vessel is occluded. There are bridging collaterals providing some antegrade flow.  The SVG to PDA is widely patent.  LEFT VENTRICULOGRAM: Left ventricular angiogram was done in the 30 RAO projection and revealed normal left ventricular wall motion and systolic function with an estimated ejection fraction of 55 %. LVEDP was 15 mmHg. 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 This was present on the ventriculogram done in 2010.  IMPRESSIONS:  1. Widely patent left main coronary artery. 2. Occluded mid left anterior descending artery. Patent LIMA to LAD. 3. Severely diseased mid left circumflex artery and its OM branches. The jump graft SVG to OM 1 and OM 2 is widely patent 4. Occluded mid right coronary artery. The SVG to PDA is widely patent 5. Normal left ventricular systolic function. 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 Ejection fraction 55%. RECOMMENDATION: Continue medical therapy. Grafts appear unchanged. The patient will followup with Dr. Martinique. Unclear if any management is required for this basal inferior aneurysm. Consider MRI. It has been present for at least 5 years.    Assessment / Plan: 1. CAD - grafts patent and study was felt to be unchanged. Continue medical therapy.  2. Gastritis noted on EGD - on PPI therapy - no further issues reported.   3. HTN - BP looks good.  4. HLD - on statin therapy  5. DM  6. Fatigue. Unclear etiology. Some of this may be supratentorial. Will reduce metoprolol to 25 mg daily and see if this improves.

## 2014-02-25 NOTE — Patient Instructions (Signed)
Reduce metoprolol to 25 mg daily  Continue your other therapy  I will see you in 6 months.

## 2014-03-22 ENCOUNTER — Ambulatory Visit: Payer: BC Managed Care – PPO | Admitting: Internal Medicine

## 2014-04-29 ENCOUNTER — Ambulatory Visit: Payer: BC Managed Care – PPO | Admitting: Internal Medicine

## 2014-05-24 ENCOUNTER — Telehealth: Payer: Self-pay

## 2014-05-24 NOTE — Telephone Encounter (Signed)
Patient called for samples of effient placed samples up front 

## 2014-05-27 ENCOUNTER — Encounter: Payer: Self-pay | Admitting: Internal Medicine

## 2014-05-27 ENCOUNTER — Other Ambulatory Visit (INDEPENDENT_AMBULATORY_CARE_PROVIDER_SITE_OTHER): Payer: Medicare Other

## 2014-05-27 ENCOUNTER — Ambulatory Visit (INDEPENDENT_AMBULATORY_CARE_PROVIDER_SITE_OTHER): Payer: Medicare Other | Admitting: Internal Medicine

## 2014-05-27 VITALS — BP 104/70 | HR 77 | Temp 98.3°F | Ht 67.0 in | Wt 183.4 lb

## 2014-05-27 DIAGNOSIS — E7800 Pure hypercholesterolemia, unspecified: Secondary | ICD-10-CM

## 2014-05-27 DIAGNOSIS — L989 Disorder of the skin and subcutaneous tissue, unspecified: Secondary | ICD-10-CM

## 2014-05-27 DIAGNOSIS — E1165 Type 2 diabetes mellitus with hyperglycemia: Principal | ICD-10-CM

## 2014-05-27 DIAGNOSIS — N320 Bladder-neck obstruction: Secondary | ICD-10-CM

## 2014-05-27 DIAGNOSIS — I200 Unstable angina: Secondary | ICD-10-CM

## 2014-05-27 DIAGNOSIS — IMO0001 Reserved for inherently not codable concepts without codable children: Secondary | ICD-10-CM

## 2014-05-27 DIAGNOSIS — I10 Essential (primary) hypertension: Secondary | ICD-10-CM

## 2014-05-27 DIAGNOSIS — E119 Type 2 diabetes mellitus without complications: Secondary | ICD-10-CM

## 2014-05-27 DIAGNOSIS — Z23 Encounter for immunization: Secondary | ICD-10-CM

## 2014-05-27 LAB — LIPID PANEL
Cholesterol: 181 mg/dL (ref 0–200)
HDL: 49.6 mg/dL (ref >39.00)
LDL Cholesterol: 105 mg/dL — ABNORMAL HIGH (ref 0–99)
NonHDL: 131.4
Total CHOL/HDL Ratio: 4
Triglycerides: 132 mg/dL (ref 0.0–149.0)
VLDL: 26.4 mg/dL (ref 0.0–40.0)

## 2014-05-27 LAB — URINALYSIS, ROUTINE W REFLEX MICROSCOPIC
Bilirubin Urine: NEGATIVE
HGB URINE DIPSTICK: NEGATIVE
Ketones, ur: NEGATIVE
Leukocytes, UA: NEGATIVE
NITRITE: NEGATIVE
SPECIFIC GRAVITY, URINE: 1.02 (ref 1.000–1.030)
TOTAL PROTEIN, URINE-UPE24: NEGATIVE
Urine Glucose: NEGATIVE
Urobilinogen, UA: 0.2 (ref 0.0–1.0)
pH: 5.5 (ref 5.0–8.0)

## 2014-05-27 LAB — BASIC METABOLIC PANEL
BUN: 13 mg/dL (ref 6–23)
CALCIUM: 9.4 mg/dL (ref 8.4–10.5)
CO2: 25 meq/L (ref 19–32)
Chloride: 101 mEq/L (ref 96–112)
Creatinine, Ser: 1.7 mg/dL — ABNORMAL HIGH (ref 0.4–1.5)
GFR: 53.61 mL/min — ABNORMAL LOW (ref >60.00)
Glucose, Bld: 106 mg/dL — ABNORMAL HIGH (ref 70–99)
Potassium: 3.7 mEq/L (ref 3.5–5.1)
Sodium: 137 mEq/L (ref 135–145)

## 2014-05-27 LAB — HEPATIC FUNCTION PANEL
ALBUMIN: 3.8 g/dL (ref 3.5–5.2)
ALT: 26 U/L (ref 0–53)
AST: 32 U/L (ref 0–37)
Alkaline Phosphatase: 75 U/L (ref 39–117)
BILIRUBIN DIRECT: 0.1 mg/dL (ref 0.0–0.3)
TOTAL PROTEIN: 7 g/dL (ref 6.0–8.3)
Total Bilirubin: 0.6 mg/dL (ref 0.2–1.2)

## 2014-05-27 LAB — CBC WITH DIFFERENTIAL/PLATELET
BASOS PCT: 0.6 % (ref 0.0–3.0)
Basophils Absolute: 0 10*3/uL (ref 0.0–0.1)
Eosinophils Absolute: 0.1 10*3/uL (ref 0.0–0.7)
Eosinophils Relative: 1.3 % (ref 0.0–5.0)
HCT: 39.9 % (ref 39.0–52.0)
HEMOGLOBIN: 13.5 g/dL (ref 13.0–17.0)
LYMPHS PCT: 26.2 % (ref 12.0–46.0)
Lymphs Abs: 1.3 10*3/uL (ref 0.7–4.0)
MCHC: 33.9 g/dL (ref 30.0–36.0)
MCV: 86.3 fl (ref 78.0–100.0)
MONOS PCT: 8.5 % (ref 3.0–12.0)
Monocytes Absolute: 0.4 10*3/uL (ref 0.1–1.0)
Neutro Abs: 3.2 10*3/uL (ref 1.4–7.7)
Neutrophils Relative %: 63.4 % (ref 43.0–77.0)
Platelets: 159 10*3/uL (ref 150.0–400.0)
RBC: 4.62 Mil/uL (ref 4.22–5.81)
RDW: 17.6 % — ABNORMAL HIGH (ref 11.5–15.5)
WBC: 5.1 10*3/uL (ref 4.0–10.5)

## 2014-05-27 LAB — HEMOGLOBIN A1C: HEMOGLOBIN A1C: 9.1 % — AB (ref 4.6–6.5)

## 2014-05-27 NOTE — Patient Instructions (Addendum)
You had the Tetanus shot today (Td)  You will be contacted regarding the referral for: dermatology  Please continue all other medications as before, and refills have been done if requested.  Please have the pharmacy call with any other refills you may need.  Please continue your efforts at being more active, low cholesterol diet, and weight control.  You are otherwise up to date with prevention measures today.  Please keep your appointments with your specialists as you may have planned  Please go to the LAB in the Basement (turn left off the elevator) for the tests to be done today  You will be contacted by phone if any changes need to be made immediately.  Otherwise, you will receive a letter about your results with an explanation, but please check with MyChart first.  Please remember to sign up for MyChart if you have not done so, as this will be important to you in the future with finding out test results, communicating by private email, and scheduling acute appointments online when needed.  Please return in 6 months, or sooner if needed

## 2014-05-27 NOTE — Progress Notes (Signed)
Subjective:    Patient ID: Antonio [name], male    DOB: [birth year], 68 y.o.   MRN: [id number]  HPI  Here to f/u; overall doing ok,  Pt denies chest pain, increased sob or doe, wheezing, orthopnea, PND, increased LE swelling, palpitations, dizziness or syncope.  Pt denies polydipsia, polyuria, or low sugar symptoms such as weakness or confusion improved with po intake.  Pt denies new neurological symptoms such as new headache, or facial or extremity weakness or numbness.   Pt states overall good compliance with meds, has been trying to follow lower cholesterol, diabetic diet, with wt overall stable,  but little exercise however. Due for Td.  No current complaints except for new nonhealing rather large lesion left lateral mid leg for just over 1 month, no pain, ? Ringworm.no ulcer, tender, drainage. Past Medical History  Diagnosis Date  . 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 HYPERCHOLESTEROLEMIA 12/17/2010  . HYPERTENSION 12/17/2010  . CAD     a. s/p CABG 1996 - last cath 08/2013 - patent grafts.  Marland Kitchen BENIGN PROSTATIC HYPERTROPHY, WITH OBSTRUCTION 12/17/2010  . Prostate cancer     s/p radiation   . Myocardial infarction 1996; 2006  . Type II diabetes mellitus 12/17/2010  . Bleeding per rectum     "related to prostate cancer and hemorrhoids" (09/03/2012)  . GERD (gastroesophageal reflux disease)   . Urine incontinence   . Chicken pox    Past Surgical History  Procedure Laterality Date  . Cardiac catheterization  01/27/2009    ef 60%  . Coronary artery bypass graft  1996    LIMA GRAFT TO THE LAD, SAPHENOUS VEIN GRAFT SEQUENTIALLY TO THE FIRST AND SECOND OBTUSE MARGINAL VESSELS, SAPHENOUS VEIN GRAFT TO THE DIAGONAL, AND SAPHENOUS VEIN GRAFT TO THE DISTAL RIGHT CORONARY   . Laceration repair  1980's    LEFT HAND  . Foot surgery  1980's    LEFT, "shot a nail gun thru it" (09/03/2012)  . Cervical disc surgery  ? 1990's    "went in on the side" (09/03/2012)  . Cardiac catheterization  08/25/2012     Severe 3v obstructive CAD, continued graft patency (SVG-D,  SVG-OM1-OM2, SVG-PDA, LIMA-LAD). area of diffuse dz in the distal LCx (up to 90%) unamenable to PCI  . Esophagogastroduodenoscopy Left 11/19/2013    Procedure: ESOPHAGOGASTRODUODENOSCOPY (EGD);  Surgeon: Arta Silence, MD;  Location: [facility] ENDOSCOPY;  Service: Endoscopy;  Laterality: Left;    reports that he quit smoking about 11 years ago. His smoking use included Cigarettes. He has a 37 pack-year smoking history. He has never used smokeless tobacco. He reports that he does not drink alcohol or use illicit drugs. family history includes Cancer in his mother, other, and other; Diabetes in his other; Hypertension in his father, mother, and other. Allergies  Allergen Reactions  . Demerol Other (See Comments)    hallucinations  . Meperidine And Related    Current Outpatient Prescriptions on File Prior to Visit  Medication Sig Dispense Refill  . aspirin EC 81 MG tablet Take 81 mg by mouth daily.      . Insulin Glargine (LANTUS SOLOSTAR) 100 UNIT/ML Solostar Pen Inject 10 Units into the skin every morning.       . Insulin Pen Needle (BD PEN NEEDLE NANO U/F) 32G X 4 MM MISC 1 pen by Does not apply route daily.  30 each  11  . losartan-hydrochlorothiazide (HYZAAR) 100-25 MG per tablet Take 1 tablet by mouth daily.  30 tablet  0  . metoprolol tartrate (LOPRESSOR) 25 MG tablet Take 1 tablet (25 mg total) by mouth daily.  90 tablet  3  . Multiple Vitamin (MULTIVITAMIN WITH MINERALS) TABS Take 1 tablet by mouth daily.      . nitroGLYCERIN (NITROSTAT) 0.4 MG SL tablet Place 1 tablet (0.4 mg total) under the tongue every 5 (five) minutes x 3 doses as needed for chest pain.  25 tablet  3  . pantoprazole (PROTONIX) 40 MG tablet Take 1 tablet (40 mg total) by mouth 2 (two) times daily.  60 tablet  11  . prasugrel (EFFIENT) 10 MG TABS tablet Take 10 mg by mouth daily.      . simvastatin (ZOCOR) 80 MG tablet Take 0.5 tablets (40 mg total) by mouth  every morning.  30 tablet  6  . SitaGLIPtin-MetFORMIN HCl [id number] MG TB24 Take 1 tablet by mouth once.  30 tablet  11  . traMADol (ULTRAM) 50 MG tablet Take 1 tablet (50 mg total) by mouth every 6 (six) hours as needed.  30 tablet  1   No current facility-administered medications on file prior to visit.   Review of Systems  Constitutional: Negative for unusual diaphoresis or other sweats  HENT: Negative for ringing in ear Eyes: Negative for double vision or worsening visual disturbance.  Respiratory: Negative for choking and stridor.   Gastrointestinal: Negative for vomiting or other signifcant bowel change Genitourinary: Negative for hematuria or decreased urine volume.  Musculoskeletal: Negative for other MSK pain or swelling Skin: Negative for color change and worsening wound.  Neurological: Negative for tremors and numbness other than noted  Psychiatric/Behavioral: Negative for decreased concentration or agitation other than above       Objective:   Physical Exam BP 104/70  Pulse 77  Temp(Src) 98.3 F (36.8 C) (Oral)  Ht 5\' 7"  (1.702 m)  Wt 183 lb 6.4 oz (83.19 kg)  BMI 28.72 kg/m2  SpO2 97% VS noted,  Constitutional: Pt appears well-developed, well-nourished.  HENT: Head: NCAT.  Right Ear: External ear normal.  Left Ear: External ear normal.  Eyes: . Pupils are equal, round, and reactive to light. Conjunctivae and EOM are normal Neck: Normal range of motion. Neck supple.  Cardiovascular: Normal rate and regular rhythm.   Pulmonary/Chest: Effort normal and breath sounds normal.  Abd:  Soft, NT, ND, + BS Neurological: Pt is alert. Not confused , motor grossly intact Skin: Skin is warm. No rash, has scaly/firm gray 1.5 cm area slightly raised left lateral mid leg without red/sweling/tender/ulcer Psychiatric: Pt behavior is normal. No agitation.     Assessment & Plan:

## 2014-05-27 NOTE — Progress Notes (Signed)
Pre visit review using our clinic review tool, if applicable. No additional management support is needed unless otherwise documented below in the visit note. 

## 2014-05-28 DIAGNOSIS — N320 Bladder-neck obstruction: Secondary | ICD-10-CM | POA: Insufficient documentation

## 2014-05-28 DIAGNOSIS — L989 Disorder of the skin and subcutaneous tissue, unspecified: Secondary | ICD-10-CM | POA: Insufficient documentation

## 2014-05-28 NOTE — Assessment & Plan Note (Signed)
uncontroleld recently, symptomatically improved on current ts, o/w stable overall by history and exam, recent data reviewed with pt, and pt to continue medical treatment as before,  to f/u any worsening symptoms or concerns, for f/u labs today Lab Results  Component Value Date   HGBA1C 10.7* 12/14/2013

## 2014-05-28 NOTE — Assessment & Plan Note (Signed)
For derm referral - ? Skin ca

## 2014-05-28 NOTE — Assessment & Plan Note (Signed)
Also for psa as he is due 

## 2014-05-28 NOTE — Assessment & Plan Note (Signed)
stable overall by history and exam, recent data reviewed with pt, and pt to continue medical treatment as before,  to f/u any worsening symptoms or concerns Lab Results  Component Value Date   LDLCALC 92 12/14/2013   For f/u ldl, goal ldl < 70

## 2014-05-28 NOTE — Assessment & Plan Note (Signed)
stable overall by history and exam, recent data reviewed with pt, and pt to continue medical treatment as before,  to f/u any worsening symptoms or concerns BP Readings from Last 3 Encounters:  05/27/14 104/70  02/25/14 119/72  01/28/14 110/78

## 2014-05-30 LAB — TSH: TSH: 0.5 u[IU]/mL (ref 0.35–4.50)

## 2014-05-30 LAB — PSA: PSA: 0.96 ng/mL (ref 0.10–4.00)

## 2014-05-31 ENCOUNTER — Encounter: Payer: Self-pay | Admitting: Internal Medicine

## 2014-05-31 ENCOUNTER — Other Ambulatory Visit: Payer: Self-pay

## 2014-06-24 ENCOUNTER — Telehealth: Payer: Self-pay

## 2014-06-24 NOTE — Telephone Encounter (Signed)
patient called for samples of effient placed samples at front desk

## 2014-07-26 ENCOUNTER — Telehealth: Payer: Self-pay | Admitting: *Deleted

## 2014-07-26 NOTE — Telephone Encounter (Signed)
Effient samples placed at the front desk for patient. 

## 2014-08-26 ENCOUNTER — Ambulatory Visit: Payer: Medicare Other | Admitting: Cardiology

## 2014-09-01 ENCOUNTER — Encounter: Payer: Self-pay | Admitting: Cardiology

## 2014-09-01 ENCOUNTER — Telehealth: Payer: Self-pay

## 2014-09-01 NOTE — Telephone Encounter (Signed)
Patient called for samples of effient placed  A month supply up front

## 2014-09-22 ENCOUNTER — Encounter (HOSPITAL_COMMUNITY): Payer: Self-pay | Admitting: Cardiology

## 2014-10-05 ENCOUNTER — Telehealth: Payer: Self-pay | Admitting: *Deleted

## 2014-10-05 ENCOUNTER — Telehealth: Payer: Self-pay | Admitting: Cardiology

## 2014-10-05 ENCOUNTER — Other Ambulatory Visit: Payer: Self-pay

## 2014-10-05 MED ORDER — PRASUGREL HCL 10 MG PO TABS: 10.0000 mg | ORAL_TABLET | Freq: Every day | ORAL | Status: DC

## 2014-10-05 MED ORDER — NITROGLYCERIN 0.4 MG SL SUBL: 0.4000 mg | SUBLINGUAL_TABLET | SUBLINGUAL | Status: DC | PRN

## 2014-10-05 NOTE — Telephone Encounter (Signed)
prasugrel (EFFIENT) 10 MG TABS tablet Take 10 mg by mouth daily   Patient Instructions     Continue with your current medicines    Burtis Junes, NP at 12/27/2013 8:45 AM

## 2014-10-05 NOTE — Telephone Encounter (Signed)
Pt would like some samples of Effient please.

## 2014-10-05 NOTE — Telephone Encounter (Signed)
Pt presented to pickup med samples, also asked if we could refill Nitrostat SL. I submitted this to his pharmacy of choice.

## 2014-10-05 NOTE — Telephone Encounter (Signed)
Samples at front desk, pt notified. 

## 2014-10-10 MED ORDER — PRASUGREL HCL 10 MG PO TABS: 10.0000 mg | ORAL_TABLET | Freq: Every day | ORAL | Status: DC

## 2014-10-10 NOTE — Addendum Note (Signed)
Addended by: Golden Hurter D on: 10/10/2014 03:46 PM   Modules accepted: Orders

## 2014-10-10 NOTE — Telephone Encounter (Signed)
Spoke to patient received a message needs effient refilled.Refill was sent to pharmacy.Stated he did not need a refill. Advised needs appointment with Dr.Jordan last appt 02/25/14.Appointment scheduled with Dr.Jordan 11/28/14 at 9:30 am.

## 2014-11-28 ENCOUNTER — Ambulatory Visit: Payer: Medicare Other | Admitting: Cardiology

## 2014-12-07 ENCOUNTER — Encounter: Payer: Self-pay | Admitting: Cardiology

## 2014-12-15 ENCOUNTER — Encounter: Payer: Self-pay | Admitting: Cardiology

## 2014-12-15 ENCOUNTER — Ambulatory Visit (INDEPENDENT_AMBULATORY_CARE_PROVIDER_SITE_OTHER): Payer: BC Managed Care – PPO | Admitting: Cardiology

## 2014-12-15 VITALS — BP 154/92 | HR 82 | Ht 67.5 in | Wt 191.3 lb

## 2014-12-15 DIAGNOSIS — I25709 Atherosclerosis of coronary artery bypass graft(s), unspecified, with unspecified angina pectoris: Secondary | ICD-10-CM | POA: Diagnosis not present

## 2014-12-15 DIAGNOSIS — E7800 Pure hypercholesterolemia, unspecified: Secondary | ICD-10-CM

## 2014-12-15 DIAGNOSIS — I10 Essential (primary) hypertension: Secondary | ICD-10-CM

## 2014-12-15 MED ORDER — TADALAFIL 10 MG PO TABS: 10.0000 mg | ORAL_TABLET | Freq: Every day | ORAL | Status: DC | PRN

## 2014-12-15 NOTE — Patient Instructions (Signed)
Continue your current therapy  You make take Cialis 10 mg prn but do not take Ntg within 48 hours of Cialis.  I will see you in 6 months.

## 2014-12-15 NOTE — Progress Notes (Signed)
Antonio [name] Date of Birth: [birth year] Medical Record #[id number]  History of Present Illness: Antonio [name] is seen for follow up CAD. He has known CAD with remote CABG in 1996, last cath in February of 2015 showed patent grafts - noted inferior basal aneurysm. Similar findings on cath in 2014. Other issues include HTN, HLD, past alcohol abuse, BPH, prostate cancer treated with radiation, and type 2 DM.   On follow up today he is doing well. He is back working again for [employer]. Last week he experienced some chest pain but he thought it was more related to gas and was relieved with Protonix. He did not take Ntg. He has gained 8 lbs and reports his DM isn't well controlled. He wants Rx for Cialis. Other than this episode last week he has had no chest pain.    Current Outpatient Prescriptions  Medication Sig Dispense Refill  . aspirin EC 81 MG tablet Take 81 mg by mouth daily.    . Insulin Glargine (LANTUS SOLOSTAR) 100 UNIT/ML Solostar Pen Inject 20 Units into the skin every morning.     . Insulin Pen Needle (BD PEN NEEDLE NANO U/F) 32G X 4 MM MISC 1 pen by Does not apply route daily. 30 each 11  . losartan-hydrochlorothiazide (HYZAAR) 100-25 MG per tablet Take 1 tablet by mouth daily. 30 tablet 0  . metoprolol tartrate (LOPRESSOR) 25 MG tablet Take 1 tablet (25 mg total) by mouth daily. 90 tablet 3  . Multiple Vitamin (MULTIVITAMIN WITH MINERALS) TABS Take 1 tablet by mouth daily.    . nitroGLYCERIN (NITROSTAT) 0.4 MG SL tablet Place 1 tablet (0.4 mg total) under the tongue every 5 (five) minutes x 3 doses as needed for chest pain. 25 tablet 3  . pantoprazole (PROTONIX) 40 MG tablet Take 1 tablet (40 mg total) by mouth 2 (two) times daily. 60 tablet 11  . prasugrel (EFFIENT) 10 MG TABS tablet Take 1 tablet (10 mg total) by mouth daily. 30 tablet 2  . simvastatin (ZOCOR) 80 MG tablet Take 0.5 tablets (40 mg total) by mouth every morning. 30 tablet 6  . SitaGLIPtin-MetFORMIN HCl [phone]  MG TB24 Take 1 tablet by mouth once. 30 tablet 11  . traMADol (ULTRAM) 50 MG tablet Take 1 tablet (50 mg total) by mouth every 6 (six) hours as needed. 30 tablet 1  . tadalafil (CIALIS) 10 MG tablet Take 1 tablet (10 mg total) by mouth daily as needed for erectile dysfunction. 10 tablet 0   No current facility-administered medications for this visit.    Allergies  Allergen Reactions  . Demerol Other (See Comments)    hallucinations  . Meperidine And Related     Past Medical History  Diagnosis Date  . 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 HYPERCHOLESTEROLEMIA 12/17/2010  . HYPERTENSION 12/17/2010  . CAD     a. s/p CABG 1996 - last cath 08/2013 - patent grafts.  Marland Kitchen BENIGN PROSTATIC HYPERTROPHY, WITH OBSTRUCTION 12/17/2010  . Prostate cancer     s/p radiation   . Myocardial infarction 1996; 2006  . Type II diabetes mellitus 12/17/2010  . Bleeding per rectum     "related to prostate cancer and hemorrhoids" (09/03/2012)  . GERD (gastroesophageal reflux disease)   . Urine incontinence   . Chicken pox     Past Surgical History  Procedure Laterality Date  . Cardiac catheterization  01/27/2009    ef 60%  . Coronary artery bypass graft  1996    LIMA  GRAFT TO THE LAD, SAPHENOUS VEIN GRAFT SEQUENTIALLY TO THE FIRST AND SECOND OBTUSE MARGINAL VESSELS, SAPHENOUS VEIN GRAFT TO THE DIAGONAL, AND SAPHENOUS VEIN GRAFT TO THE DISTAL RIGHT CORONARY   . Laceration repair  1980's    LEFT HAND  . Foot surgery  1980's    LEFT, "shot a nail gun thru it" (09/03/2012)  . Cervical disc surgery  ? 1990's    "went in on the side" (09/03/2012)  . Cardiac catheterization  08/25/2012    Severe 3v obstructive CAD, continued graft patency (SVG-D,  SVG-OM1-OM2, SVG-PDA, LIMA-LAD). area of diffuse dz in the distal LCx (up to 90%) unamenable to PCI  . Esophagogastroduodenoscopy Left 11/19/2013    Procedure: ESOPHAGOGASTRODUODENOSCOPY (EGD);  Surgeon: Arta Silence, MD;  Location: [facility] ENDOSCOPY;  Service: Endoscopy;   Laterality: Left;  . Left heart catheterization with coronary angiogram N/A 09/04/2012    Procedure: LEFT HEART CATHETERIZATION WITH CORONARY ANGIOGRAM;  Surgeon: Karion Cudd M Martinique, MD;  Location: [facility] CATH LAB;  Service: Cardiovascular;  Laterality: N/A;  . Left heart catheterization with coronary/graft angiogram N/A 11/22/2013    Procedure: LEFT HEART CATHETERIZATION WITH Beatrix Fetters;  Surgeon: Jettie Booze, MD;  Location: [facility] CATH LAB;  Service: Cardiovascular;  Laterality: N/A;    History  Smoking status  . Former Smoker -- 1.00 packs/day for 37 years  . Types: Cigarettes  . Quit date: 10/14/2002  Smokeless tobacco  . Never Used    History  Alcohol Use No    Comment: Rare    Family History  Problem Relation Age of Onset  . Diabetes Other     2 siblings, 1 of whom is deceased  . Hypertension Other   . Cancer Other     Lung Cancer  . Cancer Other     FH of Prostate Cancer 1st degree relative  . Cancer Mother     breast  . Hypertension Mother   . Hypertension Father     Review of Systems: The review of systems is per the HPI.  All other systems were reviewed and are negative.  Physical Exam: BP 154/92 mmHg  Pulse 82  Ht 5' 7.5" (1.715 m)  Wt 191 lb 4.8 oz (86.773 kg)  BMI 29.50 kg/m2 Patient is very pleasant and in no acute distress. Skin is warm and dry. Color is normal.  HEENT is unremarkable. Normocephalic/atraumatic. PERRL. Sclera are nonicteric. Neck is supple. No masses. No JVD. Lungs are clear. Cardiac exam shows a regular rate and rhythm. Split S2 noted. Abdomen is soft. Extremities are without edema. Gait and ROM are intact. No gross neurologic deficits noted.  LABORATORY DATA:  Lab Results  Component Value Date   WBC 5.1 05/27/2014   HGB 13.5 05/27/2014   HCT 39.9 05/27/2014   PLT 159.0 05/27/2014   GLUCOSE 106* 05/27/2014   CHOL 181 05/27/2014   TRIG 132.0 05/27/2014   HDL 49.60 05/27/2014   LDLCALC 105* 05/27/2014   ALT 26  05/27/2014   AST 32 05/27/2014   NA 137 05/27/2014   K 3.7 05/27/2014   CL 101 05/27/2014   CREATININE 1.7* 05/27/2014   BUN 13 05/27/2014   CO2 25 05/27/2014   TSH 0.50 05/27/2014   PSA 0.96 05/27/2014   INR 0.97 11/21/2013   HGBA1C 9.1* 05/27/2014   MICROALBUR 2.1* 12/14/2013     Ecg today shows NSR with new anterior T wave inversion. Old inferior infarct. I have personally reviewed and interpreted this study.   Assessment / Plan: 1.  CAD - Only one recent episode of chest pain relieved with Protonix. Ecg changes are worrisome but cardiac caths the last 2 years showed patent grafts with no change. I will not pursue further evaluation unless he has more frequent or severe chest pain.   2. Gastritis dx by EGD - on PPI therapy   3. HTN - BP looks good.  4. HLD - on statin therapy  5. DM type 2.  Plan: given Rx for Cialis 10 mg prn. Instructed not to take SL Ntg within 48 hours of Cialis. Needs to work on weight loss, increased aerobic activity, and eating less sweets.

## 2014-12-17 ENCOUNTER — Encounter (HOSPITAL_COMMUNITY): Payer: Self-pay | Admitting: Physical Medicine and Rehabilitation

## 2014-12-17 ENCOUNTER — Emergency Department (HOSPITAL_COMMUNITY): Payer: Medicare Other

## 2014-12-17 ENCOUNTER — Inpatient Hospital Stay (HOSPITAL_COMMUNITY)
Admission: EM | Admit: 2014-12-17 | Discharge: 2014-12-20 | DRG: 287 | Disposition: A | Discharge status: Home / Self Care | Payer: Medicare Other | Attending: Cardiology | Admitting: Cardiology

## 2014-12-17 DIAGNOSIS — K219 Gastro-esophageal reflux disease without esophagitis: Secondary | ICD-10-CM | POA: Diagnosis not present

## 2014-12-17 DIAGNOSIS — E876 Hypokalemia: Secondary | ICD-10-CM | POA: Diagnosis present

## 2014-12-17 DIAGNOSIS — Z8042 Family history of malignant neoplasm of prostate: Secondary | ICD-10-CM

## 2014-12-17 DIAGNOSIS — I200 Unstable angina: Secondary | ICD-10-CM | POA: Diagnosis not present

## 2014-12-17 DIAGNOSIS — F1011 Alcohol abuse, in remission: Secondary | ICD-10-CM | POA: Diagnosis present

## 2014-12-17 DIAGNOSIS — I214 Non-ST elevation (NSTEMI) myocardial infarction: Secondary | ICD-10-CM | POA: Diagnosis not present

## 2014-12-17 DIAGNOSIS — E1159 Type 2 diabetes mellitus with other circulatory complications: Secondary | ICD-10-CM | POA: Diagnosis present

## 2014-12-17 DIAGNOSIS — Z87891 Personal history of nicotine dependence: Secondary | ICD-10-CM

## 2014-12-17 DIAGNOSIS — Z8546 Personal history of malignant neoplasm of prostate: Secondary | ICD-10-CM

## 2014-12-17 DIAGNOSIS — I10 Essential (primary) hypertension: Secondary | ICD-10-CM | POA: Diagnosis not present

## 2014-12-17 DIAGNOSIS — Z923 Personal history of irradiation: Secondary | ICD-10-CM | POA: Diagnosis not present

## 2014-12-17 DIAGNOSIS — I252 Old myocardial infarction: Secondary | ICD-10-CM | POA: Diagnosis not present

## 2014-12-17 DIAGNOSIS — N529 Male erectile dysfunction, unspecified: Secondary | ICD-10-CM | POA: Diagnosis present

## 2014-12-17 DIAGNOSIS — E785 Hyperlipidemia, unspecified: Secondary | ICD-10-CM | POA: Diagnosis not present

## 2014-12-17 DIAGNOSIS — Z79899 Other long term (current) drug therapy: Secondary | ICD-10-CM | POA: Diagnosis not present

## 2014-12-17 DIAGNOSIS — Z8249 Family history of ischemic heart disease and other diseases of the circulatory system: Secondary | ICD-10-CM

## 2014-12-17 DIAGNOSIS — Z951 Presence of aortocoronary bypass graft: Secondary | ICD-10-CM

## 2014-12-17 DIAGNOSIS — Z794 Long term (current) use of insulin: Secondary | ICD-10-CM | POA: Diagnosis not present

## 2014-12-17 DIAGNOSIS — R079 Chest pain, unspecified: Secondary | ICD-10-CM | POA: Diagnosis not present

## 2014-12-17 DIAGNOSIS — Z8719 Personal history of other diseases of the digestive system: Secondary | ICD-10-CM

## 2014-12-17 DIAGNOSIS — N400 Enlarged prostate without lower urinary tract symptoms: Secondary | ICD-10-CM | POA: Diagnosis not present

## 2014-12-17 DIAGNOSIS — E119 Type 2 diabetes mellitus without complications: Secondary | ICD-10-CM

## 2014-12-17 DIAGNOSIS — I25110 Atherosclerotic heart disease of native coronary artery with unstable angina pectoris: Secondary | ICD-10-CM | POA: Diagnosis not present

## 2014-12-17 DIAGNOSIS — E780 Pure hypercholesterolemia: Secondary | ICD-10-CM | POA: Diagnosis present

## 2014-12-17 DIAGNOSIS — Z7982 Long term (current) use of aspirin: Secondary | ICD-10-CM | POA: Diagnosis not present

## 2014-12-17 DIAGNOSIS — E1169 Type 2 diabetes mellitus with other specified complication: Secondary | ICD-10-CM | POA: Diagnosis present

## 2014-12-17 DIAGNOSIS — I2510 Atherosclerotic heart disease of native coronary artery without angina pectoris: Secondary | ICD-10-CM | POA: Diagnosis not present

## 2014-12-17 HISTORY — DX: Male erectile dysfunction, unspecified: N52.9

## 2014-12-17 HISTORY — DX: Atherosclerotic heart disease of native coronary artery without angina pectoris: I25.10

## 2014-12-17 HISTORY — DX: Benign prostatic hyperplasia without lower urinary tract symptoms: N40.0

## 2014-12-17 HISTORY — DX: Essential (primary) hypertension: I10

## 2014-12-17 HISTORY — DX: Personal history of other diseases of the digestive system: Z87.19

## 2014-12-17 HISTORY — DX: Hyperlipidemia, unspecified: E78.5

## 2014-12-17 LAB — TROPONIN I

## 2014-12-17 LAB — I-STAT TROPONIN, ED
TROPONIN I, POC: 0.01 ng/mL (ref 0.00–0.08)
Troponin i, poc: 0.01 ng/mL (ref 0.00–0.08)

## 2014-12-17 LAB — CBC WITH DIFFERENTIAL/PLATELET
Basophils Absolute: 0 10*3/uL (ref 0.0–0.1)
Basophils Relative: 0 % (ref 0–1)
Eosinophils Absolute: 0.2 10*3/uL (ref 0.0–0.7)
Eosinophils Relative: 3 % (ref 0–5)
HEMATOCRIT: 42.2 % (ref 39.0–52.0)
HEMOGLOBIN: 14.6 g/dL (ref 13.0–17.0)
LYMPHS PCT: 21 % (ref 12–46)
Lymphs Abs: 1.2 10*3/uL (ref 0.7–4.0)
MCH: 29.5 pg (ref 26.0–34.0)
MCHC: 34.6 g/dL (ref 30.0–36.0)
MCV: 85.3 fL (ref 78.0–100.0)
MONOS PCT: 8 % (ref 3–12)
Monocytes Absolute: 0.4 10*3/uL (ref 0.1–1.0)
NEUTROS PCT: 68 % (ref 43–77)
Neutro Abs: 3.8 10*3/uL (ref 1.7–7.7)
PLATELETS: 150 10*3/uL (ref 150–400)
RBC: 4.95 MIL/uL (ref 4.22–5.81)
RDW: 15.8 % — AB (ref 11.5–15.5)
WBC: 5.6 10*3/uL (ref 4.0–10.5)

## 2014-12-17 LAB — PROTIME-INR
INR: 0.95 (ref 0.00–1.49)
Prothrombin Time: 12.8 seconds (ref 11.6–15.2)

## 2014-12-17 LAB — COMPREHENSIVE METABOLIC PANEL
ALBUMIN: 3.8 g/dL (ref 3.5–5.2)
ALK PHOS: 66 U/L (ref 39–117)
ALT: 29 U/L (ref 0–53)
AST: 36 U/L (ref 0–37)
Anion gap: 8 (ref 5–15)
BILIRUBIN TOTAL: 0.8 mg/dL (ref 0.3–1.2)
BUN: 10 mg/dL (ref 6–23)
CALCIUM: 9.1 mg/dL (ref 8.4–10.5)
CO2: 25 mmol/L (ref 19–32)
Chloride: 103 mmol/L (ref 96–112)
Creatinine, Ser: 1.29 mg/dL (ref 0.50–1.35)
GFR calc Af Amer: 65 mL/min — ABNORMAL LOW (ref >90)
GFR, EST NON AFRICAN AMERICAN: 56 mL/min — AB (ref >90)
Glucose, Bld: 203 mg/dL — ABNORMAL HIGH (ref 70–99)
POTASSIUM: 3.4 mmol/L — AB (ref 3.5–5.1)
SODIUM: 136 mmol/L (ref 135–145)
Total Protein: 6.9 g/dL (ref 6.0–8.3)

## 2014-12-17 LAB — HEPARIN LEVEL (UNFRACTIONATED): HEPARIN UNFRACTIONATED: 0.4 [IU]/mL (ref 0.30–0.70)

## 2014-12-17 LAB — GLUCOSE, CAPILLARY
Glucose-Capillary: 152 mg/dL — ABNORMAL HIGH (ref 70–99)
Glucose-Capillary: 221 mg/dL — ABNORMAL HIGH (ref 70–99)

## 2014-12-17 LAB — MAGNESIUM: MAGNESIUM: 1.7 mg/dL (ref 1.5–2.5)

## 2014-12-17 LAB — BRAIN NATRIURETIC PEPTIDE: B Natriuretic Peptide: 196.7 pg/mL — ABNORMAL HIGH (ref 0.0–100.0)

## 2014-12-17 LAB — TSH: TSH: 0.932 u[IU]/mL (ref 0.350–4.500)

## 2014-12-17 MED ORDER — ATORVASTATIN CALCIUM 40 MG PO TABS
40.0000 mg | ORAL_TABLET | Freq: Every day | ORAL | Status: DC
  Administered 2014-12-18 – 2014-12-19 (×2): 40 mg via ORAL
  Filled 2014-12-17 (×2): qty 1

## 2014-12-17 MED ORDER — INSULIN GLARGINE 100 UNIT/ML ~~LOC~~ SOLN
15.0000 [IU] | Freq: Every morning | SUBCUTANEOUS | Status: DC
  Administered 2014-12-18 – 2014-12-20 (×2): 15 [IU] via SUBCUTANEOUS
  Filled 2014-12-17 (×3): qty 0.15

## 2014-12-17 MED ORDER — ASPIRIN EC 81 MG PO TBEC
81.0000 mg | DELAYED_RELEASE_TABLET | Freq: Every day | ORAL | Status: DC
  Administered 2014-12-18 – 2014-12-20 (×2): 81 mg via ORAL
  Filled 2014-12-17 (×2): qty 1

## 2014-12-17 MED ORDER — ONDANSETRON HCL 4 MG/2ML IJ SOLN
4.0000 mg | Freq: Once | INTRAMUSCULAR | Status: AC
  Administered 2014-12-17: 4 mg via INTRAVENOUS
  Filled 2014-12-17: qty 2

## 2014-12-17 MED ORDER — SODIUM CHLORIDE 0.9 % IV SOLN
INTRAVENOUS | Status: DC
  Administered 2014-12-17: 11:00:00 via INTRAVENOUS

## 2014-12-17 MED ORDER — INSULIN ASPART 100 UNIT/ML ~~LOC~~ SOLN
0.0000 [IU] | Freq: Three times a day (TID) | SUBCUTANEOUS | Status: DC
  Administered 2014-12-17: 2 [IU] via SUBCUTANEOUS
  Administered 2014-12-18: 5 [IU] via SUBCUTANEOUS
  Administered 2014-12-18 (×2): 2 [IU] via SUBCUTANEOUS
  Administered 2014-12-19: 3 [IU] via SUBCUTANEOUS
  Administered 2014-12-20: 1 [IU] via SUBCUTANEOUS

## 2014-12-17 MED ORDER — LOSARTAN POTASSIUM 50 MG PO TABS
100.0000 mg | ORAL_TABLET | Freq: Every day | ORAL | Status: DC
  Administered 2014-12-18: 100 mg via ORAL
  Filled 2014-12-17: qty 2

## 2014-12-17 MED ORDER — HEPARIN BOLUS VIA INFUSION
4000.0000 [IU] | Freq: Once | INTRAVENOUS | Status: AC
  Administered 2014-12-17: 4000 [IU] via INTRAVENOUS
  Filled 2014-12-17: qty 4000

## 2014-12-17 MED ORDER — ONDANSETRON HCL 4 MG/2ML IJ SOLN: 4.0000 mg | Freq: Four times a day (QID) | INTRAMUSCULAR | Status: DC | PRN

## 2014-12-17 MED ORDER — METOPROLOL SUCCINATE ER 25 MG PO TB24
25.0000 mg | ORAL_TABLET | Freq: Every day | ORAL | Status: DC
  Administered 2014-12-18 – 2014-12-20 (×3): 25 mg via ORAL
  Filled 2014-12-17 (×3): qty 1

## 2014-12-17 MED ORDER — ASPIRIN 300 MG RE SUPP: 300.0000 mg | RECTAL | Status: AC

## 2014-12-17 MED ORDER — NITROGLYCERIN 0.4 MG SL SUBL: 0.4000 mg | SUBLINGUAL_TABLET | SUBLINGUAL | Status: DC | PRN

## 2014-12-17 MED ORDER — NITROGLYCERIN 0.4 MG SL SUBL
0.4000 mg | SUBLINGUAL_TABLET | SUBLINGUAL | Status: DC | PRN
  Administered 2014-12-18 – 2014-12-19 (×5): 0.4 mg via SUBLINGUAL

## 2014-12-17 MED ORDER — ADULT MULTIVITAMIN W/MINERALS CH
1.0000 | ORAL_TABLET | Freq: Every day | ORAL | Status: DC
  Administered 2014-12-18 – 2014-12-20 (×3): 1 via ORAL
  Filled 2014-12-17 (×3): qty 1

## 2014-12-17 MED ORDER — TRAMADOL HCL 50 MG PO TABS
50.0000 mg | ORAL_TABLET | Freq: Four times a day (QID) | ORAL | Status: DC | PRN
  Administered 2014-12-18 – 2014-12-19 (×3): 50 mg via ORAL
  Filled 2014-12-17 (×3): qty 1

## 2014-12-17 MED ORDER — HEPARIN (PORCINE) IN NACL 100-0.45 UNIT/ML-% IJ SOLN
1150.0000 [IU]/h | INTRAMUSCULAR | Status: DC
  Administered 2014-12-17 – 2014-12-18 (×2): 1150 [IU]/h via INTRAVENOUS
  Filled 2014-12-17 (×3): qty 250

## 2014-12-17 MED ORDER — LOSARTAN POTASSIUM-HCTZ 100-25 MG PO TABS: 1.0000 | ORAL_TABLET | Freq: Every day | ORAL | Status: DC

## 2014-12-17 MED ORDER — ACETAMINOPHEN 325 MG PO TABS
650.0000 mg | ORAL_TABLET | ORAL | Status: DC | PRN
  Administered 2014-12-17 – 2014-12-19 (×7): 650 mg via ORAL
  Filled 2014-12-17 (×7): qty 2

## 2014-12-17 MED ORDER — HYDROCHLOROTHIAZIDE 25 MG PO TABS
25.0000 mg | ORAL_TABLET | Freq: Every day | ORAL | Status: DC
  Administered 2014-12-18: 25 mg via ORAL
  Filled 2014-12-17: qty 1

## 2014-12-17 MED ORDER — INSULIN ASPART 100 UNIT/ML ~~LOC~~ SOLN
0.0000 [IU] | Freq: Every day | SUBCUTANEOUS | Status: DC
  Administered 2014-12-17: 2 [IU] via SUBCUTANEOUS
  Administered 2014-12-18: 4 [IU] via SUBCUTANEOUS
  Administered 2014-12-19: 2 [IU] via SUBCUTANEOUS

## 2014-12-17 MED ORDER — ASPIRIN EC 81 MG PO TBEC: 81.0000 mg | DELAYED_RELEASE_TABLET | Freq: Every day | ORAL | Status: DC

## 2014-12-17 MED ORDER — ZOLPIDEM TARTRATE 5 MG PO TABS
5.0000 mg | ORAL_TABLET | Freq: Every evening | ORAL | Status: DC | PRN
  Administered 2014-12-19: 5 mg via ORAL
  Filled 2014-12-17: qty 1

## 2014-12-17 MED ORDER — POTASSIUM CHLORIDE CRYS ER 20 MEQ PO TBCR
40.0000 meq | EXTENDED_RELEASE_TABLET | Freq: Once | ORAL | Status: AC
  Administered 2014-12-17: 40 meq via ORAL
  Filled 2014-12-17: qty 2

## 2014-12-17 MED ORDER — PANTOPRAZOLE SODIUM 40 MG PO TBEC
40.0000 mg | DELAYED_RELEASE_TABLET | Freq: Two times a day (BID) | ORAL | Status: DC
  Administered 2014-12-17 – 2014-12-20 (×6): 40 mg via ORAL
  Filled 2014-12-17 (×6): qty 1

## 2014-12-17 MED ORDER — ALPRAZOLAM 0.25 MG PO TABS: 0.2500 mg | ORAL_TABLET | Freq: Two times a day (BID) | ORAL | Status: DC | PRN

## 2014-12-17 MED ORDER — MORPHINE SULFATE 4 MG/ML IJ SOLN
4.0000 mg | Freq: Once | INTRAMUSCULAR | Status: AC
  Administered 2014-12-17: 4 mg via INTRAVENOUS
  Filled 2014-12-17: qty 1

## 2014-12-17 MED ORDER — PRASUGREL HCL 10 MG PO TABS
10.0000 mg | ORAL_TABLET | Freq: Every day | ORAL | Status: DC
  Administered 2014-12-18 – 2014-12-20 (×3): 10 mg via ORAL
  Filled 2014-12-17 (×3): qty 1

## 2014-12-17 MED ORDER — SODIUM CHLORIDE 0.9 % IV SOLN
INTRAVENOUS | Status: DC
  Administered 2014-12-17: 18:00:00 via INTRAVENOUS

## 2014-12-17 MED ORDER — ASPIRIN 81 MG PO CHEW
324.0000 mg | CHEWABLE_TABLET | ORAL | Status: AC
  Administered 2014-12-17: 324 mg via ORAL
  Filled 2014-12-17: qty 4

## 2014-12-17 NOTE — ED Notes (Signed)
Patient transported to X-ray 

## 2014-12-17 NOTE — ED Notes (Signed)
Pt presents to department for evaluation of non radiating L sided chest pain. Ongoing intermittently since Thursday. Also states SOB. 4/10 pain upon arrival to ED. Pt is alert and oriented x4.

## 2014-12-17 NOTE — H&P (Signed)
Primary cardiologist: Dr Martinique  HPI: 69 year old male with past medical history of diabetes, hypertension, hyperlipidemia, coronary artery disease status post coronary artery bypass graft in 1996 with unstable angina. Last catheterization in February 2015 showed an occluded LAD, severe disease in the obtuse marginal and an occluded RCA. The LIMA to the LAD was patent, saphenous vein graft to the diagonal patent, jump graft to the first and second marginals patent and patient saphenous vein graft to the PDA patent. Patient's ejection fraction was 55%. Echocardiogram in February 2015 showed normal LV function, grade 1 diastolic dysfunction, mild left atrial enlargement and mild mitral regurgitation. Patient complains of approximately 3 weeks of chest pain. There is a mild pain that is continuous. However when he exerts himself he develops more severe chest pain that improves with stopping. There is associated dyspnea but no nausea or diaphoresis. One episode radiated down his left upper extremity. The pain is not pleuritic. He denies orthopnea, PND or pedal edema.   (Not in a hospital admission)  Allergies  Allergen Reactions  . Demerol Other (See Comments)    hallucinations  . Meperidine And Related     Past Medical History  Diagnosis Date  . 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 HYPERCHOLESTEROLEMIA 12/17/2010  . HYPERTENSION 12/17/2010  . CAD     a. s/p CABG 1996 - last cath 08/2013 - patent grafts.  Marland Kitchen BENIGN PROSTATIC HYPERTROPHY, WITH OBSTRUCTION 12/17/2010  . Prostate cancer     s/p radiation   . Myocardial infarction 1996; 2006  . Type II diabetes mellitus 12/17/2010  . Bleeding per rectum     "related to prostate cancer and hemorrhoids" (09/03/2012)  . GERD (gastroesophageal reflux disease)   . Urine incontinence   . Chicken pox     Past Surgical History  Procedure Laterality Date  . Cardiac catheterization  01/27/2009    ef 60%  . Coronary artery bypass graft  1996   LIMA GRAFT TO THE LAD, SAPHENOUS VEIN GRAFT SEQUENTIALLY TO THE FIRST AND SECOND OBTUSE MARGINAL VESSELS, SAPHENOUS VEIN GRAFT TO THE DIAGONAL, AND SAPHENOUS VEIN GRAFT TO THE DISTAL RIGHT CORONARY   . Laceration repair  1980's    LEFT HAND  . Foot surgery  1980's    LEFT, "shot a nail gun thru it" (09/03/2012)  . Cervical disc surgery  ? 1990's    "went in on the side" (09/03/2012)  . Cardiac catheterization  08/25/2012    Severe 3v obstructive CAD, continued graft patency (SVG-D,  SVG-OM1-OM2, SVG-PDA, LIMA-LAD). area of diffuse dz in the distal LCx (up to 90%) unamenable to PCI  . Esophagogastroduodenoscopy Left 11/19/2013    Procedure: ESOPHAGOGASTRODUODENOSCOPY (EGD);  Surgeon: Arta Silence, MD;  Location: [facility] ENDOSCOPY;  Service: Endoscopy;  Laterality: Left;  . Left heart catheterization with coronary angiogram N/A 09/04/2012    Procedure: LEFT HEART CATHETERIZATION WITH CORONARY ANGIOGRAM;  Surgeon: Peter M Martinique, MD;  Location: [facility] CATH LAB;  Service: Cardiovascular;  Laterality: N/A;  . Left heart catheterization with coronary/graft angiogram N/A 11/22/2013    Procedure: LEFT HEART CATHETERIZATION WITH Beatrix Fetters;  Surgeon: Jettie Booze, MD;  Location: [facility] CATH LAB;  Service: Cardiovascular;  Laterality: N/A;    History   Social History  . Marital Status: Married    Spouse Name: N/A  . Number of Children: 1  . Years of Education: N/A   Occupational History  . 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  Smoking status: Former Smoker -- 1.00 packs/day for 37 years    Types: Cigarettes    Quit date: 10/14/2002  . Smokeless tobacco: Never Used  . Alcohol Use: 0.0 oz/week    0 Standard drinks or equivalent per week     Comment: 6 pack beer per week  . Drug Use: No  . Sexual Activity: Yes   Other Topics Concern  . Not on file   Social History Narrative   Regular exercise-yes    Family History  Problem Relation Age of Onset  .  Diabetes Other     2 siblings, 1 of whom is deceased  . Hypertension Other   . Cancer Other     Lung Cancer  . Cancer Other     FH of Prostate Cancer 1st degree relative  . Cancer Mother     breast  . Hypertension Mother   . Hypertension Father   . CAD Brother     ROS:  no fevers or chills, productive cough, hemoptysis, dysphasia, odynophagia, melena, hematochezia, dysuria, hematuria, rash, seizure activity, orthopnea, PND, pedal edema, claudication. Remaining systems are negative.  Physical Exam:   Blood pressure 153/93, pulse 83, temperature 98 F (36.7 C), temperature source Oral, resp. rate 20, height 5' 7" (1.702 m), weight 192 lb (87.091 kg), SpO2 99 %.  General:  Well developed/well nourished in NAD Skin warm/dry Patient not depressed No peripheral clubbing Back-normal HEENT-normal/normal eyelids Neck supple/normal carotid upstroke bilaterally; no bruits; no JVD; no thyromegaly chest - CTA/ normal expansion, previous sternotomy CV - RRR/normal S1 and S2; no murmurs, rubs; positive gallop;  PMI nondisplaced Abdomen -NT/ND, no HSM, no mass, + bowel sounds, no bruit 2+ femoral pulses, no bruits Ext-no edema, chords, 2+ DP Neuro-grossly nonfocal  ECG sinus rhythm with anterior T-wave inversion unchanged compared to previous.  Results for orders placed or performed during the hospital encounter of 12/17/14 (from the past 48 hour(s))  CBC with Differential     Status: Abnormal   Collection Time: 12/17/14 10:07 AM  Result Value Ref Range   WBC 5.6 4.0 - 10.5 K/uL   RBC 4.95 4.22 - 5.81 MIL/uL   Hemoglobin 14.6 13.0 - 17.0 g/dL   HCT 42.2 39.0 - 52.0 %   MCV 85.3 78.0 - 100.0 fL   MCH 29.5 26.0 - 34.0 pg   MCHC 34.6 30.0 - 36.0 g/dL   RDW 15.8 (H) 11.5 - 15.5 %   Platelets 150 150 - 400 K/uL   Neutrophils Relative % 68 43 - 77 %   Neutro Abs 3.8 1.7 - 7.7 K/uL   Lymphocytes Relative 21 12 - 46 %   Lymphs Abs 1.2 0.7 - 4.0 K/uL   Monocytes Relative 8 3 - 12 %    Monocytes Absolute 0.4 0.1 - 1.0 K/uL   Eosinophils Relative 3 0 - 5 %   Eosinophils Absolute 0.2 0.0 - 0.7 K/uL   Basophils Relative 0 0 - 1 %   Basophils Absolute 0.0 0.0 - 0.1 K/uL  Comprehensive metabolic panel     Status: Abnormal   Collection Time: 12/17/14 10:07 AM  Result Value Ref Range   Sodium 136 135 - 145 mmol/L   Potassium 3.4 (L) 3.5 - 5.1 mmol/L   Chloride 103 96 - 112 mmol/L   CO2 25 19 - 32 mmol/L   Glucose, Bld 203 (H) 70 - 99 mg/dL   BUN 10 6 - 23 mg/dL   Creatinine, Ser 1.29 0.50 - 1.35 mg/dL  Calcium 9.1 8.4 - 10.5 mg/dL   Total Protein 6.9 6.0 - 8.3 g/dL   Albumin 3.8 3.5 - 5.2 g/dL   AST 36 0 - 37 U/L   ALT 29 0 - 53 U/L   Alkaline Phosphatase 66 39 - 117 U/L   Total Bilirubin 0.8 0.3 - 1.2 mg/dL   GFR calc non Af Amer 56 (L) >90 mL/min   GFR calc Af Amer 65 (L) >90 mL/min    Comment: (NOTE) The eGFR has been calculated using the CKD EPI equation. This calculation has not been validated in all clinical situations. eGFR's persistently <90 mL/min signify possible Chronic Kidney Disease.    Anion gap 8 5 - 15  Brain natriuretic peptide     Status: Abnormal   Collection Time: 12/17/14 10:07 AM  Result Value Ref Range   B Natriuretic Peptide 196.7 (H) 0.0 - 100.0 pg/mL  I-Stat Troponin, ED (not at [facility])     Status: None   Collection Time: 12/17/14 10:12 AM  Result Value Ref Range   Troponin i, poc 0.01 0.00 - 0.08 ng/mL   Comment 3            Comment: Due to the release kinetics of cTnI, a negative result within the first hours of the onset of symptoms does not rule out myocardial infarction with certainty. If myocardial infarction is still suspected, repeat the test at appropriate intervals.     Dg Chest 2 View  12/17/2014   CLINICAL DATA:  Left-sided chest pain for 3 weeks. Shortness of breath. History of CABG in 1996.  EXAM: CHEST  2 VIEW  COMPARISON:  11/17/2013  FINDINGS: Median sternotomy for CABG. Midline trachea. Borderline cardiomegaly with  a tortuous descending thoracic aorta. Lower cervical spine fixation. No pleural effusion or pneumothorax. No congestive failure. No lobar consolidation.  IMPRESSION: No acute cardiopulmonary disease.   Electronically Signed   By: Abigail Miyamoto M.D.   On: 12/17/2014 11:27    Assessment/Plan 1 unstable angina-plan to admit and cycle enzymes. Patient's symptoms have an atypical component but overall are extremely concerning. Plan to treat with aspirin and continue prasugrel. Add heparin. Continue beta blocker and statin. Proceed with cardiac catheterization on Monday. The risks and benefits were discussed and he agrees to proceed. 2 Coronary artery disease-continue aspirin and statin. 3 hypertension-continue present medications. 4 hypokalemia-supplement 5 hyperlipidemia-continue statin 6 diabetes mellitus-continue home regimen. Follow CBG.  Kirk Ruths MD 12/17/2014, 1:02 PM

## 2014-12-17 NOTE — Progress Notes (Signed)
ANTICOAGULATION CONSULT NOTE - Follow Up Consult  Pharmacy Consult for Heparin  Indication: chest pain/ACS  Allergies  Allergen Reactions  . Demerol Other (See Comments)    hallucinations  . Meperidine And Related     Patient Measurements: Height: 5' 7.5" (171.5 cm) Weight: 189 lb 8 oz (85.957 kg) IBW/kg (Calculated) : 67.25  Vital Signs: Temp: 98 F (36.7 C) (03/05 2100) Temp Source: Oral (03/05 1619) BP: 153/111 mmHg (03/05 2100) Pulse Rate: 88 (03/05 2100)  Labs:  Recent Labs  12/17/14 1007 12/17/14 1640 12/17/14 2312  HGB 14.6  --   --   HCT 42.2  --   --   PLT 150  --   --   LABPROT  --  12.8  --   INR  --  0.95  --   HEPARINUNFRC  --   --  0.40  CREATININE 1.29  --   --   TROPONINI  --  <0.03  --     Estimated Creatinine Clearance: 59.6 mL/min (by C-G formula based on Cr of 1.29).  Assessment: Therapeutic heparin level x 1  Goal of Therapy:  Heparin level 0.3-0.7 units/ml Monitor platelets by anticoagulation protocol: Yes   Plan:  -Continue heparin at 1150 units/hr -Confirmatory HL with AM labs -Daily CBC/HL -Monitor for bleeding  Narda Bonds 12/17/2014,11:46 PM

## 2014-12-17 NOTE — Progress Notes (Signed)
ANTICOAGULATION CONSULT NOTE - Initial Consult  Pharmacy Consult for heparin Indication: chest pain/ACS  Allergies  Allergen Reactions  . Demerol Other (See Comments)    hallucinations  . Meperidine And Related     Patient Measurements: Height: 5\' 7"  (170.2 cm) Weight: 192 lb (87.091 kg) IBW/kg (Calculated) : 66.1 Heparin Dosing Weight: 84kg  Vital Signs: Temp: 98 F (36.7 C) (03/05 0948) Temp Source: Oral (03/05 0948) BP: 169/113 mmHg (03/05 1521) Pulse Rate: 81 (03/05 1521)  Labs:  Recent Labs  12/17/14 1007  HGB 14.6  HCT 42.2  PLT 150  CREATININE 1.29    Estimated Creatinine Clearance: 59.4 mL/min (by C-G formula based on Cr of 1.29).   Medical History: Past Medical History  Diagnosis Date  . 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 HYPERCHOLESTEROLEMIA 12/17/2010  . HYPERTENSION 12/17/2010  . CAD     a. s/p CABG 1996 - last cath 08/2013 - patent grafts.  Marland Kitchen BENIGN PROSTATIC HYPERTROPHY, WITH OBSTRUCTION 12/17/2010  . Prostate cancer     s/p radiation   . Myocardial infarction 1996; 2006  . Type II diabetes mellitus 12/17/2010  . Bleeding per rectum     "related to prostate cancer and hemorrhoids" (09/03/2012)  . GERD (gastroesophageal reflux disease)   . Urine incontinence   . Chicken pox     Medications:  Prescriptions prior to admission  Medication Sig Dispense Refill Last Dose  . aspirin EC 81 MG tablet Take 81 mg by mouth daily.   12/16/2014 at Unknown time  . Insulin Glargine (LANTUS SOLOSTAR) 100 UNIT/ML Solostar Pen Inject 20 Units into the skin every morning.    12/17/2014 at Unknown time  . Insulin Pen Needle (BD PEN NEEDLE NANO U/F) 32G X 4 MM MISC 1 pen by Does not apply route daily. 30 each 11 12/17/2014 at Unknown time  . losartan-hydrochlorothiazide (HYZAAR) 100-25 MG per tablet Take 1 tablet by mouth daily. 30 tablet 0 12/17/2014 at Unknown time  . metoprolol tartrate (LOPRESSOR) 25 MG tablet Take 1 tablet (25 mg total) by mouth daily. 90 tablet 3  12/17/2014 at 9am  . Multiple Vitamin (MULTIVITAMIN WITH MINERALS) TABS Take 1 tablet by mouth daily.   12/17/2014 at Unknown time  . nitroGLYCERIN (NITROSTAT) 0.4 MG SL tablet Place 1 tablet (0.4 mg total) under the tongue every 5 (five) minutes x 3 doses as needed for chest pain. 25 tablet 3 12/16/2014 at Unknown time  . pantoprazole (PROTONIX) 40 MG tablet Take 1 tablet (40 mg total) by mouth 2 (two) times daily. 60 tablet 11 12/17/2014 at Unknown time  . prasugrel (EFFIENT) 10 MG TABS tablet Take 1 tablet (10 mg total) by mouth daily. 30 tablet 2 12/17/2014 at Unknown time  . simvastatin (ZOCOR) 80 MG tablet Take 0.5 tablets (40 mg total) by mouth every morning. 30 tablet 6 12/17/2014 at Unknown time  . SitaGLIPtin-MetFORMIN HCl [phone] MG TB24 Take 1 tablet by mouth once. 30 tablet 11 12/17/2014 at Unknown time  . traMADol (ULTRAM) 50 MG tablet Take 1 tablet (50 mg total) by mouth every 6 (six) hours as needed. (Patient taking differently: Take 50 mg by mouth every 6 (six) hours as needed for moderate pain. ) 30 tablet 1 12/17/2014 at Unknown time  . tadalafil (CIALIS) 10 MG tablet Take 1 tablet (10 mg total) by mouth daily as needed for erectile dysfunction. 10 tablet 0 unknown   Scheduled:  . aspirin  324 mg Oral NOW   Or  . aspirin  300 mg Rectal NOW  . aspirin EC  81 mg Oral Daily  . [START ON 12/18/2014] aspirin EC  81 mg Oral Daily  . atorvastatin  40 mg Oral q1800  . insulin aspart  0-5 Units Subcutaneous QHS  . insulin aspart  0-9 Units Subcutaneous TID WC  . [START ON 12/18/2014] Insulin Glargine  15 Units Subcutaneous q morning - 10a  . losartan-hydrochlorothiazide  1 tablet Oral Daily  . metoprolol succinate  25 mg Oral Daily  . multivitamin with minerals  1 tablet Oral Daily  . pantoprazole  40 mg Oral BID  . prasugrel  10 mg Oral Daily    Assessment: 69 yo male here with CP and noted with history of CAD w/ CABG. Pharmacy has been consulted to dose heparin. He is noted for cath on  Monday.  Goal of Therapy:  Heparin level 0.3-0.7 units/ml Monitor platelets by anticoagulation protocol: Yes   Plan:  -Heparin bolus 4000 units IV followed by 1150 units/hr (~ 14 units/kg/hr) -Heparin level in 6 hours and daily wth CBC daily  Hildred Laser, Pharm D 12/17/2014 3:55 PM

## 2014-12-17 NOTE — ED Notes (Signed)
Ordered diet tray 

## 2014-12-17 NOTE — ED Notes (Signed)
Attempted report x1. 

## 2014-12-17 NOTE — ED Provider Notes (Signed)
CSN: [id number]     Arrival date & time 12/17/14  0940 History   First MD Initiated Contact with Patient 12/17/14 0957     Chief Complaint  Patient presents with  . Chest Pain     (Consider location/radiation/quality/duration/timing/severity/associated sxs/prior Treatment) HPI   PCP: Myriam Jacobson, MD  Cardiologist: Dr. Peter Martinique Blood pressure 160/101, pulse 80, temperature 98 F (36.7 C), temperature source Oral, resp. rate 18, height 5\' 7"  (1.702 m), weight 192 lb (87.091 kg), SpO2 98 %.  Afnan Emberton is a 69 y.o.male with a significant PMH of MI, Cardiac cath (most recent 2015), bygass graft,  presents to the ER with complaints of chest pain that has been ongoing for the past 3 weeks. He reports it has been progressively increasing in severity. He has been having dyspnea and pain with exertion and symptoms improved significantly with rest. The patient describes a constant 4 out of 10 pain that has not resolved over the past 3 weeks. The patient admits to seeing his cardiologist Dr. Martinique last week and telling him that the Protonix alleviated his chest pain but he reports lying about it so that he could go back to work. This morning he started having worsening pain at rest, this is worse than baseline therefore he presented to the ED for evaluation- he reports this pain not as severe but similar to his MI pain. His pain is left sided, pressure, radiates intermittently towards the left arm. No nausea, vomiting, diaphoresis, cough, fevers, body aches.   Past Medical History  Diagnosis Date  . 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 HYPERCHOLESTEROLEMIA 12/17/2010  . HYPERTENSION 12/17/2010  . CAD     a. s/p CABG 1996 - last cath 08/2013 - patent grafts.  Marland Kitchen BENIGN PROSTATIC HYPERTROPHY, WITH OBSTRUCTION 12/17/2010  . Prostate cancer     s/p radiation   . Myocardial infarction 1996; 2006  . Type II diabetes mellitus 12/17/2010  . Bleeding per rectum     "related to prostate cancer  and hemorrhoids" (09/03/2012)  . GERD (gastroesophageal reflux disease)   . Urine incontinence   . Chicken pox    Past Surgical History  Procedure Laterality Date  . Cardiac catheterization  01/27/2009    ef 60%  . Coronary artery bypass graft  1996    LIMA GRAFT TO THE LAD, SAPHENOUS VEIN GRAFT SEQUENTIALLY TO THE FIRST AND SECOND OBTUSE MARGINAL VESSELS, SAPHENOUS VEIN GRAFT TO THE DIAGONAL, AND SAPHENOUS VEIN GRAFT TO THE DISTAL RIGHT CORONARY   . Laceration repair  1980's    LEFT HAND  . Foot surgery  1980's    LEFT, "shot a nail gun thru it" (09/03/2012)  . Cervical disc surgery  ? 1990's    "went in on the side" (09/03/2012)  . Cardiac catheterization  08/25/2012    Severe 3v obstructive CAD, continued graft patency (SVG-D,  SVG-OM1-OM2, SVG-PDA, LIMA-LAD). area of diffuse dz in the distal LCx (up to 90%) unamenable to PCI  . Esophagogastroduodenoscopy Left 11/19/2013    Procedure: ESOPHAGOGASTRODUODENOSCOPY (EGD);  Surgeon: Arta Silence, MD;  Location: [facility] ENDOSCOPY;  Service: Endoscopy;  Laterality: Left;  . Left heart catheterization with coronary angiogram N/A 09/04/2012    Procedure: LEFT HEART CATHETERIZATION WITH CORONARY ANGIOGRAM;  Surgeon: Peter M Martinique, MD;  Location: [facility] CATH LAB;  Service: Cardiovascular;  Laterality: N/A;  . Left heart catheterization with coronary/graft angiogram N/A 11/22/2013    Procedure: LEFT HEART CATHETERIZATION WITH Beatrix Fetters;  Surgeon: Jettie Booze, MD;  Location: [place] CATH LAB;  Service: Cardiovascular;  Laterality: N/A;   Family History  Problem Relation Age of Onset  . Diabetes Other     2 siblings, 1 of whom is deceased  . Hypertension Other   . Cancer Other     Lung Cancer  . Cancer Other     FH of Prostate Cancer 1st degree relative  . Cancer Mother     breast  . Hypertension Mother   . Hypertension Father   . CAD Brother    History  Substance Use Topics  . Smoking status: Former Smoker -- 1.00 packs/day  for 37 years    Types: Cigarettes    Quit date: 10/14/2002  . Smokeless tobacco: Never Used  . Alcohol Use: 0.0 oz/week    0 Standard drinks or equivalent per week     Comment: 6 pack beer per week    Review of Systems  10 Systems reviewed and are negative for acute change except as noted in the HPI.   Allergies  Demerol and Meperidine and related  Home Medications   Prior to Admission medications   Medication Sig Start Date End Date Taking? Authorizing Provider  aspirin EC 81 MG tablet Take 81 mg by mouth daily.   Yes Historical Provider, MD  Insulin Glargine (LANTUS SOLOSTAR) 100 UNIT/ML Solostar Pen Inject 20 Units into the skin every morning.    Yes Historical Provider, MD  Insulin Pen Needle (BD PEN NEEDLE NANO U/F) 32G X 4 MM MISC 1 pen by Does not apply route daily. 11/11/13  Yes Stacy Gardner, PA-C  losartan-hydrochlorothiazide (HYZAAR) 100-25 MG per tablet Take 1 tablet by mouth daily. 11/22/13  Yes Belkys A Regalado, MD  metoprolol tartrate (LOPRESSOR) 25 MG tablet Take 1 tablet (25 mg total) by mouth daily. 02/25/14  Yes Peter M Martinique, MD  Multiple Vitamin (MULTIVITAMIN WITH MINERALS) TABS Take 1 tablet by mouth daily.   Yes Historical Provider, MD  nitroGLYCERIN (NITROSTAT) 0.4 MG SL tablet Place 1 tablet (0.4 mg total) under the tongue every 5 (five) minutes x 3 doses as needed for chest pain. 10/05/14  Yes Peter M Martinique, MD  pantoprazole (PROTONIX) 40 MG tablet Take 1 tablet (40 mg total) by mouth 2 (two) times daily. 01/28/14  Yes Biagio Borg, MD  prasugrel (EFFIENT) 10 MG TABS tablet Take 1 tablet (10 mg total) by mouth daily. 10/10/14  Yes Peter M Martinique, MD  simvastatin (ZOCOR) 80 MG tablet Take 0.5 tablets (40 mg total) by mouth every morning. 09/22/13  Yes Peter M Martinique, MD  SitaGLIPtin-MetFORMIN HCl [phone] MG TB24 Take 1 tablet by mouth once. 01/28/14  Yes Biagio Borg, MD  traMADol (ULTRAM) 50 MG tablet Take 1 tablet (50 mg total) by mouth every 6 (six) hours as  needed. Patient taking differently: Take 50 mg by mouth every 6 (six) hours as needed for moderate pain.  12/16/13  Yes Peter M Martinique, MD  tadalafil (CIALIS) 10 MG tablet Take 1 tablet (10 mg total) by mouth daily as needed for erectile dysfunction. 12/15/14   Peter M Martinique, MD   BP 153/93 mmHg  Pulse 83  Temp(Src) 98 F (36.7 C) (Oral)  Resp 20  Ht 5\' 7"  (1.702 m)  Wt 192 lb (87.091 kg)  BMI 30.06 kg/m2  SpO2 99% Physical Exam  Constitutional: He appears well-developed and well-nourished. No distress.  HENT:  Head: Normocephalic and atraumatic.  Eyes: Pupils are equal, round, and reactive to light.  Neck:  Normal range of motion. Neck supple.  Cardiovascular: Normal rate and regular rhythm.   Pulmonary/Chest: Effort normal. No respiratory distress. He has no wheezes. He has no rales. He exhibits no tenderness.  Abdominal: Soft.  Neurological: He is alert.  Skin: Skin is warm and dry.  Nursing note and vitals reviewed.   ED Course  Procedures (including critical care time) Labs Review Labs Reviewed  CBC WITH DIFFERENTIAL/PLATELET - Abnormal; Notable for the following:    RDW 15.8 (*)    All other components within normal limits  COMPREHENSIVE METABOLIC PANEL - Abnormal; Notable for the following:    Potassium 3.4 (*)    Glucose, Bld 203 (*)    GFR calc non Af Amer 56 (*)    GFR calc Af Amer 65 (*)    All other components within normal limits  BRAIN NATRIURETIC PEPTIDE - Abnormal; Notable for the following:    B Natriuretic Peptide 196.7 (*)    All other components within normal limits  I-STAT TROPOININ, ED  Randolm Idol, ED    Imaging Review Dg Chest 2 View  12/17/2014   CLINICAL DATA:  Left-sided chest pain for 3 weeks. Shortness of breath. History of CABG in 1996.  EXAM: CHEST  2 VIEW  COMPARISON:  11/17/2013  FINDINGS: Median sternotomy for CABG. Midline trachea. Borderline cardiomegaly with a tortuous descending thoracic aorta. Lower cervical spine fixation. No  pleural effusion or pneumothorax. No congestive failure. No lobar consolidation.  IMPRESSION: No acute cardiopulmonary disease.   Electronically Signed   By: Abigail Miyamoto M.D.   On: 12/17/2014 11:27     EKG Interpretation   Date/Time:  Saturday December 17 2014 09:43:04 EST Ventricular Rate:  81 PR Interval:  160 QRS Duration: 88 QT Interval:  370 QTC Calculation: 429 R Axis:   47 Text Interpretation:  Normal sinus rhythm T wave abnormality, consider  anterolateral ischemia Abnormal ECG Confirmed by RAY MD, Andee Poles ([id number])  on 12/17/2014 10:19:05 AM      MDM   Final diagnoses:  Chest pain, unspecified chest pain type    Dr. Jeanell Sparrow has seen patient as well as recommends that I consult with cardiology early due to concern for acute coronary syndrome. Chest x-ray and initial troponin are negative. Patient seen by cardiology and will be admitted to their service. Patient has maintained in stable condition with improved pain with treatment.  Filed Vitals:   12/17/14 1300  BP:   Pulse: 83  Temp:   Resp: [address]     Linus Mako, PA-C 12/17/14 [place], MD 12/17/14 [phone]

## 2014-12-18 LAB — CBC
HCT: 38.9 % — ABNORMAL LOW (ref 39.0–52.0)
HEMOGLOBIN: 13.1 g/dL (ref 13.0–17.0)
MCH: 28.9 pg (ref 26.0–34.0)
MCHC: 33.7 g/dL (ref 30.0–36.0)
MCV: 85.9 fL (ref 78.0–100.0)
PLATELETS: 138 10*3/uL — AB (ref 150–400)
RBC: 4.53 MIL/uL (ref 4.22–5.81)
RDW: 15.7 % — ABNORMAL HIGH (ref 11.5–15.5)
WBC: 5.9 10*3/uL (ref 4.0–10.5)

## 2014-12-18 LAB — GLUCOSE, CAPILLARY
GLUCOSE-CAPILLARY: 262 mg/dL — AB (ref 70–99)
GLUCOSE-CAPILLARY: 178 mg/dL — AB (ref 70–99)
Glucose-Capillary: 179 mg/dL — ABNORMAL HIGH (ref 70–99)
Glucose-Capillary: 333 mg/dL — ABNORMAL HIGH (ref 70–99)

## 2014-12-18 LAB — BASIC METABOLIC PANEL
Anion gap: 6 (ref 5–15)
BUN: 12 mg/dL (ref 6–23)
CALCIUM: 8.5 mg/dL (ref 8.4–10.5)
CO2: 27 mmol/L (ref 19–32)
Chloride: 104 mmol/L (ref 96–112)
Creatinine, Ser: 1.29 mg/dL (ref 0.50–1.35)
GFR calc Af Amer: 65 mL/min — ABNORMAL LOW (ref >90)
GFR calc non Af Amer: 56 mL/min — ABNORMAL LOW (ref >90)
Glucose, Bld: 140 mg/dL — ABNORMAL HIGH (ref 70–99)
POTASSIUM: 3.8 mmol/L (ref 3.5–5.1)
Sodium: 137 mmol/L (ref 135–145)

## 2014-12-18 LAB — TROPONIN I: Troponin I: <0.03 ng/mL (ref <0.031)

## 2014-12-18 LAB — LIPID PANEL
CHOLESTEROL: 167 mg/dL (ref 0–200)
HDL: 32 mg/dL — AB (ref >39)
LDL Cholesterol: 107 mg/dL — ABNORMAL HIGH (ref 0–99)
TRIGLYCERIDES: 142 mg/dL (ref <150)
Total CHOL/HDL Ratio: 5.2 RATIO
VLDL: 28 mg/dL (ref 0–40)

## 2014-12-18 LAB — HEPARIN LEVEL (UNFRACTIONATED): Heparin Unfractionated: 0.38 IU/mL (ref 0.30–0.70)

## 2014-12-18 MED ORDER — SODIUM CHLORIDE 0.9 % IJ SOLN: 3.0000 mL | INTRAMUSCULAR | Status: DC | PRN

## 2014-12-18 MED ORDER — ASPIRIN 81 MG PO CHEW
81.0000 mg | CHEWABLE_TABLET | ORAL | Status: AC
  Administered 2014-12-19: 81 mg via ORAL
  Filled 2014-12-18: qty 1

## 2014-12-18 MED ORDER — SODIUM CHLORIDE 0.9 % IV SOLN: 250.0000 mL | INTRAVENOUS | Status: DC | PRN

## 2014-12-18 MED ORDER — SODIUM CHLORIDE 0.9 % IJ SOLN
3.0000 mL | Freq: Two times a day (BID) | INTRAMUSCULAR | Status: DC
  Administered 2014-12-18: 3 mL via INTRAVENOUS

## 2014-12-18 MED ORDER — SODIUM CHLORIDE 0.9 % IV SOLN
1.0000 mL/kg/h | INTRAVENOUS | Status: DC
  Administered 2014-12-19: 1 mL/kg/h via INTRAVENOUS

## 2014-12-18 MED ORDER — NITROGLYCERIN 2 % TD OINT
0.5000 [in_us] | TOPICAL_OINTMENT | Freq: Four times a day (QID) | TRANSDERMAL | Status: DC
  Administered 2014-12-18 – 2014-12-19 (×4): 0.5 [in_us] via TOPICAL
  Filled 2014-12-18: qty 30

## 2014-12-18 NOTE — Progress Notes (Signed)
Patient Name: Antonio [name] Date of Encounter: 12/18/2014  Primary cardiologist: Dr Martinique   Principal Problem:   Unstable angina Active Problems:   Diabetes   HYPERCHOLESTEROLEMIA   Essential hypertension   Coronary atherosclerosis   CAD (coronary artery disease) of artery bypass graft    SUBJECTIVE  1 episode of CP this morning, quickly resolved after nitro. No EKG changes compare to yesterday. No SOB  CURRENT MEDS . aspirin EC  81 mg Oral Daily  . atorvastatin  40 mg Oral q1800  . losartan  100 mg Oral Daily   And  . hydrochlorothiazide  25 mg Oral Daily  . insulin aspart  0-5 Units Subcutaneous QHS  . insulin aspart  0-9 Units Subcutaneous TID WC  . insulin glargine  15 Units Subcutaneous q morning - 10a  . metoprolol succinate  25 mg Oral Daily  . multivitamin with minerals  1 tablet Oral Daily  . pantoprazole  40 mg Oral BID  . prasugrel  10 mg Oral Daily    OBJECTIVE  Filed Vitals:   12/17/14 1619 12/17/14 2100 12/17/14 2200 12/18/14 0500  BP: 160/108 153/111 148/89 135/75  Pulse: 73 88  78  Temp: 98.5 F (36.9 C) 98 F (36.7 C)  98.4 F (36.9 C)  TempSrc: Oral     Resp: 20 20  17   Height: 5' 7.5" (1.715 m)     Weight: 189 lb 8 oz (85.957 kg)   188 lb 11.2 oz (85.594 kg)  SpO2: 98% 99%  97%    Intake/Output Summary (Last 24 hours) at 12/18/14 0751 Last data filed at 12/18/14 0719  Gross per 24 hour  Intake      0 ml  Output    600 ml  Net   -600 ml   Filed Weights   12/17/14 0948 12/17/14 1619 12/18/14 0500  Weight: 192 lb (87.091 kg) 189 lb 8 oz (85.957 kg) 188 lb 11.2 oz (85.594 kg)    PHYSICAL EXAM  General: Pleasant, NAD. Neuro: Alert and oriented X 3. Moves all extremities spontaneously. Psych: Normal affect. HEENT:  Normal  Neck: Supple without bruits or JVD. Lungs:  Resp regular and unlabored, CTA. Heart: RRR no s3, s4, or murmurs. Abdomen: Soft, non-tender, non-distended, BS + x 4.  Extremities: No clubbing, cyanosis or edema.  DP/PT/Radials 2+ and equal bilaterally.  Accessory Clinical Findings  CBC  Recent Labs  12/17/14 1007 12/18/14 0314  WBC 5.6 5.9  NEUTROABS 3.8  --   HGB 14.6 13.1  HCT 42.2 38.9*  MCV 85.3 85.9  PLT 150 585*   Basic Metabolic Panel  Recent Labs  12/17/14 1007 12/17/14 1640 12/18/14 0314  NA 136  --  137  K 3.4*  --  3.8  CL 103  --  104  CO2 25  --  27  GLUCOSE 203*  --  140*  BUN 10  --  12  CREATININE 1.29  --  1.29  CALCIUM 9.1  --  8.5  MG  --  1.7  --    Liver Function Tests  Recent Labs  12/17/14 1007  AST 36  ALT 29  ALKPHOS 66  BILITOT 0.8  PROT 6.9  ALBUMIN 3.8   Cardiac Enzymes  Recent Labs  12/17/14 1640 12/18/14 0314  TROPONINI <0.03 <0.03   Fasting Lipid Panel  Recent Labs  12/18/14 0314  CHOL 167  HDL 32*  LDLCALC 107*  TRIG 142  CHOLHDL 5.2   Thyroid Function Tests  Recent Labs  12/17/14 1640  TSH 0.932    TELE NSR with HR 70s    ECG  NSR with TWI in anterolateral leads  Echocardiogram 11/18/2013  - Left ventricle: The cavity size was normal. There was mild concentric hypertrophy. Systolic function was normal. The estimated ejection fraction was in the range of 50% to 55%. Wall motion was normal; there were no regional wall motion abnormalities. Doppler parameters are consistent with abnormal left ventricular relaxation (grade 1 diastolic dysfunction). - Mitral valve: Mild regurgitation. - Left atrium: The atrium was mildly dilated. Volume: 70.60ml (S). - Pulmonary arteries: Systolic pressure was mildly increased.    Radiology/Studies  Dg Chest 2 View  12/17/2014   CLINICAL DATA:  Left-sided chest pain for 3 weeks. Shortness of breath. History of CABG in 1996.  EXAM: CHEST  2 VIEW  COMPARISON:  11/17/2013  FINDINGS: Median sternotomy for CABG. Midline trachea. Borderline cardiomegaly with a tortuous descending thoracic aorta. Lower cervical spine fixation. No pleural effusion or  pneumothorax. No congestive failure. No lobar consolidation.  IMPRESSION: No acute cardiopulmonary disease.   Electronically Signed   By: Abigail Miyamoto M.D.   On: 12/17/2014 11:27    ASSESSMENT AND PLAN  1. Unstable agnina  - continue ASA and effient, BB, statin. IV heparin  - plan for cardiac catheterization on Monday  - recurrent CP this morning, resolved with nitro x1. EKG continue to show TWI in anterolateral leads  2. CAD s/p CABG 1998 3. HTN 4. HLD 5. DM 6. H/o GI bleed: currently hgb normal  Signed, Almyra Deforest PA-C Pager: [phone] As above, patient seen and examined. Brief chest pain earlier this morning but pain-free at present. Enzymes negative. Proceed with cardiac catheterization tomorrow as outlined. Kirk Ruths

## 2014-12-18 NOTE — Progress Notes (Signed)
ANTICOAGULATION CONSULT NOTE - Follow Up Consult  Pharmacy Consult for Heparin  Indication: chest pain/ACS  Allergies  Allergen Reactions  . Demerol Other (See Comments)    hallucinations  . Meperidine And Related     Patient Measurements: Height: 5' 7.5" (171.5 cm) Weight: 188 lb 11.2 oz (85.594 kg) IBW/kg (Calculated) : 67.25  Vital Signs: Temp: 98.4 F (36.9 C) (03/06 0500) BP: 135/75 mmHg (03/06 0500) Pulse Rate: 78 (03/06 0500)  Labs:  Recent Labs  12/17/14 1007 12/17/14 1640 12/17/14 2312 12/18/14 0314  HGB 14.6  --   --  13.1  HCT 42.2  --   --  38.9*  PLT 150  --   --  138*  LABPROT  --  12.8  --   --   INR  --  0.95  --   --   HEPARINUNFRC  --   --  0.40 0.38  CREATININE 1.29  --   --  1.29  TROPONINI  --  <0.03  --  <0.03    Estimated Creatinine Clearance: 59.4 mL/min (by C-G formula based on Cr of 1.29).  Assessment: 69 yo male here with CP and noted with history of CAD w/ CABG. Pharmacy has been consulted to dose heparin.  Heparin continues to be at goal on 1150 units/hr. No bleeding issues noted. Hgb/pltc trending down, will follow trend.  He is noted for cath on Monday  Goal of Therapy:  Heparin level 0.3-0.7 units/ml Monitor platelets by anticoagulation protocol: Yes   Plan:  -Continue heparin at 1150 units/hr -Daily CBC/HL -Monitor for bleeding  Erin Hearing PharmD., BCPS Clinical Pharmacist Pager [phone] 12/18/2014 8:51 AM

## 2014-12-19 ENCOUNTER — Encounter (HOSPITAL_COMMUNITY)
Admission: EM | Disposition: A | Discharge status: Home / Self Care | Payer: Self-pay | Source: Home / Self Care | Attending: Cardiology

## 2014-12-19 ENCOUNTER — Encounter (HOSPITAL_COMMUNITY): Payer: Self-pay | Admitting: Cardiology

## 2014-12-19 DIAGNOSIS — I10 Essential (primary) hypertension: Secondary | ICD-10-CM

## 2014-12-19 DIAGNOSIS — I2510 Atherosclerotic heart disease of native coronary artery without angina pectoris: Secondary | ICD-10-CM

## 2014-12-19 DIAGNOSIS — E119 Type 2 diabetes mellitus without complications: Secondary | ICD-10-CM

## 2014-12-19 DIAGNOSIS — E780 Pure hypercholesterolemia: Secondary | ICD-10-CM

## 2014-12-19 HISTORY — PX: LEFT HEART CATHETERIZATION WITH CORONARY/GRAFT ANGIOGRAM: SHX5450

## 2014-12-19 LAB — CBC
HCT: 38.8 % — ABNORMAL LOW (ref 39.0–52.0)
Hemoglobin: 13.2 g/dL (ref 13.0–17.0)
MCH: 29 pg (ref 26.0–34.0)
MCHC: 34 g/dL (ref 30.0–36.0)
MCV: 85.3 fL (ref 78.0–100.0)
PLATELETS: 147 10*3/uL — AB (ref 150–400)
RBC: 4.55 MIL/uL (ref 4.22–5.81)
RDW: 15.4 % (ref 11.5–15.5)
WBC: 5.2 10*3/uL (ref 4.0–10.5)

## 2014-12-19 LAB — BASIC METABOLIC PANEL
Anion gap: 4 — ABNORMAL LOW (ref 5–15)
BUN: 12 mg/dL (ref 6–23)
CALCIUM: 8.3 mg/dL — AB (ref 8.4–10.5)
CO2: 28 mmol/L (ref 19–32)
Chloride: 102 mmol/L (ref 96–112)
Creatinine, Ser: 1.25 mg/dL (ref 0.50–1.35)
GFR calc Af Amer: 68 mL/min — ABNORMAL LOW (ref >90)
GFR calc non Af Amer: 58 mL/min — ABNORMAL LOW (ref >90)
GLUCOSE: 246 mg/dL — AB (ref 70–99)
Potassium: 3.7 mmol/L (ref 3.5–5.1)
Sodium: 134 mmol/L — ABNORMAL LOW (ref 135–145)

## 2014-12-19 LAB — PLATELET INHIBITION P2Y12: PLATELET FUNCTION P2Y12: 64 [PRU] — AB (ref 194–418)

## 2014-12-19 LAB — HEPARIN LEVEL (UNFRACTIONATED): Heparin Unfractionated: 0.35 IU/mL (ref 0.30–0.70)

## 2014-12-19 LAB — HEMOGLOBIN A1C
Hgb A1c MFr Bld: 8.5 % — ABNORMAL HIGH (ref 4.8–5.6)
MEAN PLASMA GLUCOSE: 197 mg/dL

## 2014-12-19 LAB — GLUCOSE, CAPILLARY
Glucose-Capillary: 200 mg/dL — ABNORMAL HIGH (ref 70–99)
Glucose-Capillary: 121 mg/dL — ABNORMAL HIGH (ref 70–99)
Glucose-Capillary: 75 mg/dL (ref 70–99)
Glucose-Capillary: 103 mg/dL — ABNORMAL HIGH (ref 70–99)
Glucose-Capillary: 139 mg/dL — ABNORMAL HIGH (ref 70–99)
Glucose-Capillary: 226 mg/dL — ABNORMAL HIGH (ref 70–99)

## 2014-12-19 LAB — PROTIME-INR
INR: 1.04 (ref 0.00–1.49)
Prothrombin Time: 13.7 seconds (ref 11.6–15.2)

## 2014-12-19 SURGERY — LEFT HEART CATHETERIZATION WITH CORONARY/GRAFT ANGIOGRAM
Anesthesia: LOCAL

## 2014-12-19 MED ORDER — LIDOCAINE HCL (PF) 1 % IJ SOLN
INTRAMUSCULAR | Status: AC
  Filled 2014-12-19: qty 30

## 2014-12-19 MED ORDER — HYDRALAZINE HCL 20 MG/ML IJ SOLN
INTRAMUSCULAR | Status: AC
  Filled 2014-12-19: qty 1

## 2014-12-19 MED ORDER — HYDRALAZINE HCL 20 MG/ML IJ SOLN
10.0000 mg | Freq: Once | INTRAMUSCULAR | Status: AC
  Administered 2014-12-19: 10 mg via INTRAVENOUS

## 2014-12-19 MED ORDER — SODIUM CHLORIDE 0.45 % IV BOLUS
250.0000 mL | Freq: Once | INTRAVENOUS | Status: AC
Start: 2014-12-19 — End: 2014-12-19
  Administered 2014-12-19: 250 mL via INTRAVENOUS

## 2014-12-19 MED ORDER — HEPARIN (PORCINE) IN NACL 2-0.9 UNIT/ML-% IJ SOLN
INTRAMUSCULAR | Status: AC
  Filled 2014-12-19: qty 1500

## 2014-12-19 MED ORDER — SODIUM CHLORIDE 0.9 % IV SOLN: 1.0000 mL/kg/h | INTRAVENOUS | Status: AC

## 2014-12-19 MED ORDER — NITROGLYCERIN 1 MG/10 ML FOR IR/CATH LAB
INTRA_ARTERIAL | Status: AC
Start: 2014-12-19 — End: 2014-12-19
  Filled 2014-12-19: qty 10

## 2014-12-19 MED ORDER — MIDAZOLAM HCL 2 MG/2ML IJ SOLN
INTRAMUSCULAR | Status: AC
  Filled 2014-12-19: qty 2

## 2014-12-19 MED ORDER — FENTANYL CITRATE 0.05 MG/ML IJ SOLN
INTRAMUSCULAR | Status: AC
  Filled 2014-12-19: qty 2

## 2014-12-19 MED ORDER — VERAPAMIL HCL 2.5 MG/ML IV SOLN
INTRAVENOUS | Status: AC
  Filled 2014-12-19: qty 4

## 2014-12-19 NOTE — Progress Notes (Signed)
    Hypotensive s/p LHC and SOB. Vitals stable. 02 sats stable. BP 94/64. Will give him some light fluids and hold night BP meds for now.   Angelena Form PA-C  [facility]

## 2014-12-19 NOTE — Progress Notes (Signed)
UR Completed Glora Hulgan Graves-Bigelow, RN,BSN [phone]  

## 2014-12-19 NOTE — Progress Notes (Signed)
Inpatient Diabetes Program Recommendations  AACE/ADA: New Consensus Statement on Inpatient Glycemic Control (2013)  Target Ranges:  Prepandial:   less than 140 mg/dL      Peak postprandial:   less than 180 mg/dL (1-2 hours)      Critically ill patients:  140 - 180 mg/dL   Reason for Assessment:Results for TANK, DIFIORE (MRN [id number]) as of 12/19/2014 12:42  Ref. Range 12/18/2014 11:38 12/18/2014 16:23 12/18/2014 21:31 12/19/2014 08:13 12/19/2014 11:46  Glucose-Capillary Latest Range: 70-99 mg/dL 262 (H) 178 (H) 333 (H) 200 (H) 121 (H)    Results for DRAKE, WUERTZ (MRN [id number]) as of 12/19/2014 12:42  Ref. Range 05/27/2014 16:59 12/17/2014 16:40  Hemoglobin A1C Latest Range: 4.8-5.6 % 9.1 (H) 8.5 (H)   Diabetes history: Diabetes Outpatient Diabetes medications: Lantus 20 units q AM, Janumet [phone] daily  Current orders for Inpatient glycemic control:  Lantus 15 units q AM,  Novolog sensitive tid with meals and HS  Note A1C improved however still not at goal.  May consider increasing Lantus to 20 units daily.  Once diet restarted, may benefit from addition of Novolog meal coverage 3 units tid with meals.  Will follow. Adah Perl, RN, BC-ADM Inpatient Diabetes Coordinator Pager [phone]

## 2014-12-19 NOTE — Progress Notes (Signed)
Site area: RFA Site Prior to Removal:  Level 0 Pressure Applied For:25 min Manual:   yes Patient Status During Pull: stable  Post Pull Site:  Level 0 Post Pull Instructions Given:  yes Post Pull Pulses Present: palpable Dressing Applied:  clear Bedrest begins @ 0518 Comments:

## 2014-12-19 NOTE — Interval H&P Note (Signed)
History and Physical Interval Note:  12/19/2014 2:19 PM  Antonio [name]  has presented today for surgery, with the diagnosis of unstable angina  The various methods of treatment have been discussed with the patient and family. After consideration of risks, benefits and other options for treatment, the patient has consented to  Procedure(s): LEFT HEART CATHETERIZATION WITH CORONARY/GRAFT ANGIOGRAM (N/A) as a surgical intervention .  The patient's history has been reviewed, patient examined, no change in status, stable for surgery.  I have reviewed the patient's chart and labs.  Questions were answered to the patient's satisfaction.   Cath Lab Visit (complete for each Cath Lab visit)  Clinical Evaluation Leading to the Procedure:   ACS: Yes.    Non-ACS:    Anginal Classification: CCS III  Anti-ischemic medical therapy: Maximal Therapy (2 or more classes of medications)  Non-Invasive Test Results: No non-invasive testing performed  Prior CABG: Previous CABG        [name] 12/19/2014 2:19 PM

## 2014-12-19 NOTE — Care Management Note (Unsigned)
    Page 1 of 1   12/19/2014     8:39:58 AM CARE MANAGEMENT NOTE 12/19/2014  Patient:  Antonio [name],Antonio [name]   Account Number:  [id number]  Date Initiated:  12/19/2014  Documentation initiated by:  GRAVES-BIGELOW,Tagen Milby  Subjective/Objective Assessment:   Pt admitted for unstable angina. Plan for cath today.     Action/Plan:   CM to monitor for disposition needs.   Anticipated DC Date:  12/20/2014   Anticipated DC Plan:  [place]  CM consult      Choice offered to / List presented to:             Status of service:  In process, will continue to follow Medicare Important Message given?   (If response is "NO", the following Medicare IM given date fields will be blank) Date Medicare IM given:   Medicare IM given by:   Date Additional Medicare IM given:   Additional Medicare IM given by:    Discharge Disposition:    Per UR Regulation:  Reviewed for med. necessity/level of care/duration of stay  If discussed at [place] of Stay Meetings, dates discussed:    Comments:

## 2014-12-19 NOTE — Progress Notes (Signed)
Patient Name: Antonio [name] Date of Encounter: 12/19/2014     Principal Problem:   Unstable angina Active Problems:   Diabetes   HYPERCHOLESTEROLEMIA   Essential hypertension   Coronary atherosclerosis   CAD (coronary artery disease) of artery bypass graft    SUBJECTIVE  No CP or SOB. Awaiting LHC today at [place] . aspirin EC  81 mg Oral Daily  . atorvastatin  40 mg Oral q1800  . losartan  100 mg Oral Daily   And  . hydrochlorothiazide  25 mg Oral Daily  . insulin aspart  0-5 Units Subcutaneous QHS  . insulin aspart  0-9 Units Subcutaneous TID WC  . insulin glargine  15 Units Subcutaneous q morning - 10a  . metoprolol succinate  25 mg Oral Daily  . multivitamin with minerals  1 tablet Oral Daily  . nitroGLYCERIN  0.5 inch Topical 4 times per day  . pantoprazole  40 mg Oral BID  . prasugrel  10 mg Oral Daily  . sodium chloride  3 mL Intravenous Q12H    OBJECTIVE  Filed Vitals:   12/19/14 0300 12/19/14 0305 12/19/14 0310 12/19/14 0500  BP: 155/105 139/94 137/87 148/101  Pulse: 77 78 79 74  Temp:    98 F (36.7 C)  TempSrc:      Resp:    17  Height:      Weight:      SpO2: 95% 96% 94% 97%    Intake/Output Summary (Last 24 hours) at 12/19/14 0750 Last data filed at 12/19/14 0700  Gross per 24 hour  Intake 528.57 ml  Output    375 ml  Net 153.57 ml   Filed Weights   12/17/14 0948 12/17/14 1619 12/18/14 0500  Weight: 192 lb (87.091 kg) 189 lb 8 oz (85.957 kg) 188 lb 11.2 oz (85.594 kg)    PHYSICAL EXAM  General: Pleasant, NAD. Neuro: Alert and oriented X 3. Moves all extremities spontaneously. Psych: Normal affect. HEENT:  Normal  Neck: Supple without bruits or JVD. Lungs:  Resp regular and unlabored, CTA. Heart: RRR no s3, s4, or murmurs. Abdomen: Soft, non-tender, non-distended, BS + x 4.  Extremities: No clubbing, cyanosis or edema. DP/PT/Radials 2+ and equal bilaterally.  Accessory Clinical Findings  CBC  Recent Labs  12/17/14 1007 12/18/14 0314 12/19/14 0615  WBC 5.6 5.9 5.2  NEUTROABS 3.8  --   --   HGB 14.6 13.1 13.2  HCT 42.2 38.9* 38.8*  MCV 85.3 85.9 85.3  PLT 150 138* 563*   Basic Metabolic Panel  Recent Labs  12/17/14 1640 12/18/14 0314 12/19/14 0615  NA  --  137 134*  K  --  3.8 3.7  CL  --  104 102  CO2  --  27 28  GLUCOSE  --  140* 246*  BUN  --  12 12  CREATININE  --  1.29 1.25  CALCIUM  --  8.5 8.3*  MG 1.7  --   --    Liver Function Tests  Recent Labs  12/17/14 1007  AST 36  ALT 29  ALKPHOS 66  BILITOT 0.8  PROT 6.9  ALBUMIN 3.8   No results for input(s): LIPASE, AMYLASE in the last 72 hours. Cardiac Enzymes  Recent Labs  12/17/14 1640 12/17/14 2312 12/18/14 0314  TROPONINI <0.03 <0.03 <0.03    Fasting Lipid Panel  Recent Labs  12/18/14 0314  CHOL 167  HDL 32*  LDLCALC 107*  TRIG 142  CHOLHDL  5.2   Thyroid Function Tests  Recent Labs  12/17/14 1640  TSH 0.932    TELE  NSR  Radiology/Studies  Dg Chest 2 View  12/17/2014   CLINICAL DATA:  Left-sided chest pain for 3 weeks. Shortness of breath. History of CABG in 1996.  EXAM: CHEST  2 VIEW  COMPARISON:  11/17/2013  FINDINGS: Median sternotomy for CABG. Midline trachea. Borderline cardiomegaly with a tortuous descending thoracic aorta. Lower cervical spine fixation. No pleural effusion or pneumothorax. No congestive failure. No lobar consolidation.  IMPRESSION: No acute cardiopulmonary disease.   Electronically Signed   By: Abigail Miyamoto M.D.   On: 12/17/2014 11:27    ASSESSMENT AND PLAN  Antonio [name] is a 69 y.o. male with a history of DM, HTN, HLD, CAD s/p CABG (1996) and hx of GI bleed who was admitted to [facility] on 12/17/14 with unstable angina   Unstable agnina  -troponin neg x3 - continue ASA and effient, BB, statin. IV heparin  - P2y12 ordered which reveals good platelet inhibition (64) - plan for cardiac catheterization today ~1pm  HTN-  moderate control. Continue toprol xl 25mg  qd. Will hold losartan 100mg /HCTZ 25mg  for cardiac cath today.   HLD- LDL 107. Continue statin  DM- continue SSI  H/o GI bleed: currently hgb normal  Judy Pimple PA-C  Pager [phone]  Personally seen and examined. Agree with above. No chest pain now but had a rough night. No edema, RRR Ready for cath, been there before he states.  Risks known.   Candee Furbish, MD

## 2014-12-19 NOTE — Progress Notes (Signed)
PHARMACY NOTE  Pharmacy Consult :  69 y.o. male is currently on Heparin infusion for USAP.   Heparin Dosing Wt :  84 kg  Hematology :  Recent Labs  12/17/14 1007 12/17/14 1640 12/17/14 2312 12/18/14 0314 12/19/14 0615  HGB 14.6  --   --  13.1 13.2  HCT 42.2  --   --  38.9* 38.8*  PLT 150  --   --  138* 147*  LABPROT  --  12.8  --   --  13.7  INR  --  0.95  --   --  1.04  HEPARINUNFRC  --   --  0.40 0.38 0.35  CREATININE 1.29  --   --  1.29 1.25    Current Medication[s] Include: Scheduled:  Scheduled:  . aspirin EC  81 mg Oral Daily  . atorvastatin  40 mg Oral q1800  . insulin aspart  0-5 Units Subcutaneous QHS  . insulin aspart  0-9 Units Subcutaneous TID WC  . insulin glargine  15 Units Subcutaneous q morning - 10a  . metoprolol succinate  25 mg Oral Daily  . multivitamin with minerals  1 tablet Oral Daily  . nitroGLYCERIN  0.5 inch Topical 4 times per day  . pantoprazole  40 mg Oral BID  . prasugrel  10 mg Oral Daily  . sodium chloride  3 mL Intravenous Q12H   Infusion[s]: Infusions:  . sodium chloride 10 mL/hr at 12/17/14 1815  . sodium chloride 1 mL/kg/hr (12/19/14 0430)  . heparin 1,150 Units/hr (12/18/14 1214)   Assessment :  Heparin infusing at 1150 units/hr.    Heparin level therapeutic at 0.35 units/ml.  No evidence of bleeding complications observed.  Goal :   Heparin level 0.3-0.7 units/ml.  Plan : 1. Heparin will be continued at the same dose and continued until Cath later today.  2. Will Follow-up post-Cath later today for anticoagulation plans.  Marthenia Rolling,  Pharm.D  12/19/2014  9:44 AM

## 2014-12-19 NOTE — H&P (View-Only) (Signed)
Patient Name: Antonio [name] Date of Encounter: 12/19/2014     Principal Problem:   Unstable angina Active Problems:   Diabetes   HYPERCHOLESTEROLEMIA   Essential hypertension   Coronary atherosclerosis   CAD (coronary artery disease) of artery bypass graft    SUBJECTIVE  No CP or SOB. Awaiting LHC today at [place] . aspirin EC  81 mg Oral Daily  . atorvastatin  40 mg Oral q1800  . losartan  100 mg Oral Daily   And  . hydrochlorothiazide  25 mg Oral Daily  . insulin aspart  0-5 Units Subcutaneous QHS  . insulin aspart  0-9 Units Subcutaneous TID WC  . insulin glargine  15 Units Subcutaneous q morning - 10a  . metoprolol succinate  25 mg Oral Daily  . multivitamin with minerals  1 tablet Oral Daily  . nitroGLYCERIN  0.5 inch Topical 4 times per day  . pantoprazole  40 mg Oral BID  . prasugrel  10 mg Oral Daily  . sodium chloride  3 mL Intravenous Q12H    OBJECTIVE  Filed Vitals:   12/19/14 0300 12/19/14 0305 12/19/14 0310 12/19/14 0500  BP: 155/105 139/94 137/87 148/101  Pulse: 77 78 79 74  Temp:    98 F (36.7 C)  TempSrc:      Resp:    17  Height:      Weight:      SpO2: 95% 96% 94% 97%    Intake/Output Summary (Last 24 hours) at 12/19/14 0750 Last data filed at 12/19/14 0700  Gross per 24 hour  Intake 528.57 ml  Output    375 ml  Net 153.57 ml   Filed Weights   12/17/14 0948 12/17/14 1619 12/18/14 0500  Weight: 192 lb (87.091 kg) 189 lb 8 oz (85.957 kg) 188 lb 11.2 oz (85.594 kg)    PHYSICAL EXAM  General: Pleasant, NAD. Neuro: Alert and oriented X 3. Moves all extremities spontaneously. Psych: Normal affect. HEENT:  Normal  Neck: Supple without bruits or JVD. Lungs:  Resp regular and unlabored, CTA. Heart: RRR no s3, s4, or murmurs. Abdomen: Soft, non-tender, non-distended, BS + x 4.  Extremities: No clubbing, cyanosis or edema. DP/PT/Radials 2+ and equal bilaterally.  Accessory Clinical Findings  CBC  Recent Labs  12/17/14 1007 12/18/14 0314 12/19/14 0615  WBC 5.6 5.9 5.2  NEUTROABS 3.8  --   --   HGB 14.6 13.1 13.2  HCT 42.2 38.9* 38.8*  MCV 85.3 85.9 85.3  PLT 150 138* 625*   Basic Metabolic Panel  Recent Labs  12/17/14 1640 12/18/14 0314 12/19/14 0615  NA  --  137 134*  K  --  3.8 3.7  CL  --  104 102  CO2  --  27 28  GLUCOSE  --  140* 246*  BUN  --  12 12  CREATININE  --  1.29 1.25  CALCIUM  --  8.5 8.3*  MG 1.7  --   --    Liver Function Tests  Recent Labs  12/17/14 1007  AST 36  ALT 29  ALKPHOS 66  BILITOT 0.8  PROT 6.9  ALBUMIN 3.8   No results for input(s): LIPASE, AMYLASE in the last 72 hours. Cardiac Enzymes  Recent Labs  12/17/14 1640 12/17/14 2312 12/18/14 0314  TROPONINI <0.03 <0.03 <0.03    Fasting Lipid Panel  Recent Labs  12/18/14 0314  CHOL 167  HDL 32*  LDLCALC 107*  TRIG 142  CHOLHDL  5.2   Thyroid Function Tests  Recent Labs  12/17/14 1640  TSH 0.932    TELE  NSR  Radiology/Studies  Dg Chest 2 View  12/17/2014   CLINICAL DATA:  Left-sided chest pain for 3 weeks. Shortness of breath. History of CABG in 1996.  EXAM: CHEST  2 VIEW  COMPARISON:  11/17/2013  FINDINGS: Median sternotomy for CABG. Midline trachea. Borderline cardiomegaly with a tortuous descending thoracic aorta. Lower cervical spine fixation. No pleural effusion or pneumothorax. No congestive failure. No lobar consolidation.  IMPRESSION: No acute cardiopulmonary disease.   Electronically Signed   By: Abigail Miyamoto M.D.   On: 12/17/2014 11:27    ASSESSMENT AND PLAN  Dak Szumski is a 69 y.o. male with a history of DM, HTN, HLD, CAD s/p CABG (1996) and hx of GI bleed who was admitted to [facility] on 12/17/14 with unstable angina   Unstable agnina  -troponin neg x3 - continue ASA and effient, BB, statin. IV heparin  - P2y12 ordered which reveals good platelet inhibition (64) - plan for cardiac catheterization today ~1pm  HTN-  moderate control. Continue toprol xl 25mg  qd. Will hold losartan 100mg /HCTZ 25mg  for cardiac cath today.   HLD- LDL 107. Continue statin  DM- continue SSI  H/o GI bleed: currently hgb normal  Judy Pimple PA-C  Pager [phone]  Personally seen and examined. Agree with above. No chest pain now but had a rough night. No edema, RRR Ready for cath, been there before he states.  Risks known.   Candee Furbish, MD

## 2014-12-19 NOTE — CV Procedure (Signed)
   Cardiac Catheterization Procedure Note  Name: Antonio [name] MRN: [id number] DOB: 08-23-1946  Procedure: Left Heart Cath, Selective Coronary Angiography, SVG and LIMA angiography. LV angiography  Indication: [name] with prior CABG presents with chest pain.    Procedural details:  We initially obtained access in the left radial artery. There was difficulty passing a wire in the mid biceps area. Angiography demonstrated we were probably subintimal. Right groin access was then attempted and it was a difficult stick. Got arterial flow but wire only advanced into a side branch. The left groin was then prepped, draped, and anesthetized with 1% lidocaine. Using modified Seldinger technique, a 5 French sheath was introduced into the left femoral artery. Standard Judkins catheters were used for coronary and graft angiography and left ventriculography. Catheter exchanges were performed over a guidewire. There were no immediate procedural complications. The patient was transferred to the post catheterization recovery area for further monitoring.  Procedural Findings: Hemodynamics:  AO 167/104 mean 129 mm Hg LV 166/15 mm Hg   Coronary angiography: Coronary dominance: right  Left mainstem: Normal.   Left anterior descending (LAD): 100% occlusion proximally.  Left circumflex (LCx): 100% occlusion in the mid vessel. The first and second OM branches are occluded.   Right coronary artery (RCA): 100% occlusion in the mid vessel with bridging right to right collaterals.   SVG to the RCA is widely patent there is some tapering of the graft immediately in the proximal graft up to 30-40%.  SVG to the diagonal is patent. There is a 40-50% ostial SVG stenosis.  SVG to the first a second OM branches is widely patent.   LIMA to the LAD is widely patent.   Left ventriculography: Left ventricular systolic function is abnormal, there is mid inferior akinesis. LVEF is estimated at 50-55%, there is  moderate mitral regurgitation   Final Conclusions:   1. Severe 3 vessel obstructive CAD 2. Patent LIMA to the LAD 3. Patent SVG to OM1 and OM2 4. Patent SVG to diagonal 5. Patent SVG to distal RCA 6. Good LV function.  Recommendations: There is no significant change from cardiac cath one year ago. Continue medical therapy. Anticipate DC in am. Will need to stay out of work for one week.  Peter Martinique, [place] 12/19/2014, 3:32 PM

## 2014-12-19 NOTE — Progress Notes (Signed)
Pt c/o left chest pain/aching 5/10, unrelieved after 3 SL NTG.  Elevated BP noted.  No significant changes noted on 12 lead EKG.  Pt now states that he feels better after passing gas and believes the pain he was feeling was "gas pain."  BP 130s/80s after 3rd SL NTG.  Dr. Oleta Mouse notified.  No new orders received.  Will continue to monitor.  Jodell Cipro

## 2014-12-20 ENCOUNTER — Encounter (HOSPITAL_COMMUNITY): Payer: Self-pay | Admitting: Physician Assistant

## 2014-12-20 ENCOUNTER — Telehealth: Payer: Self-pay

## 2014-12-20 DIAGNOSIS — Z8719 Personal history of other diseases of the digestive system: Secondary | ICD-10-CM

## 2014-12-20 DIAGNOSIS — I209 Angina pectoris, unspecified: Secondary | ICD-10-CM

## 2014-12-20 DIAGNOSIS — N529 Male erectile dysfunction, unspecified: Secondary | ICD-10-CM | POA: Diagnosis present

## 2014-12-20 DIAGNOSIS — I214 Non-ST elevation (NSTEMI) myocardial infarction: Secondary | ICD-10-CM

## 2014-12-20 DIAGNOSIS — I2510 Atherosclerotic heart disease of native coronary artery without angina pectoris: Secondary | ICD-10-CM | POA: Diagnosis present

## 2014-12-20 LAB — GLUCOSE, CAPILLARY: Glucose-Capillary: 149 mg/dL — ABNORMAL HIGH (ref 70–99)

## 2014-12-20 LAB — CBC
HEMATOCRIT: 36.1 % — AB (ref 39.0–52.0)
HEMOGLOBIN: 12.3 g/dL — AB (ref 13.0–17.0)
MCH: 29 pg (ref 26.0–34.0)
MCHC: 34.1 g/dL (ref 30.0–36.0)
MCV: 85.1 fL (ref 78.0–100.0)
Platelets: 149 10*3/uL — ABNORMAL LOW (ref 150–400)
RBC: 4.24 MIL/uL (ref 4.22–5.81)
RDW: 15.8 % — ABNORMAL HIGH (ref 11.5–15.5)
WBC: 7 10*3/uL (ref 4.0–10.5)

## 2014-12-20 LAB — POCT ACTIVATED CLOTTING TIME: ACTIVATED CLOTTING TIME: 122 s

## 2014-12-20 MED ORDER — SITAGLIP PHOS-METFORMIN HCL ER 100-1000 MG PO TB24: 1.0000 | ORAL_TABLET | Freq: Once | ORAL | Status: DC

## 2014-12-20 MED ORDER — METOPROLOL SUCCINATE ER 25 MG PO TB24: 25.0000 mg | ORAL_TABLET | Freq: Every day | ORAL | Status: DC

## 2014-12-20 NOTE — Discharge Instructions (Signed)
Groin Site Care Refer to this sheet in the next few weeks. These instructions provide you with information on caring for yourself after your procedure. Your caregiver may also give you more specific instructions. Your treatment has been planned according to current medical practices, but problems sometimes occur. Call your caregiver if you have any problems or questions after your procedure. HOME CARE INSTRUCTIONS  You may shower 24 hours after the procedure. Remove the bandage (dressing) and gently wash the site with plain soap and water. Gently pat the site dry.   Do not apply powder or lotion to the site.   Do not sit in a bathtub, swimming pool, or whirlpool for 5 to 7 days.   No bending, squatting, or lifting anything over 10 pounds (4.5 kg) as directed by your caregiver.   Inspect the site at least twice daily.   Do not drive home if you are discharged the same day of the procedure. Have someone else drive you.   You may drive 24 hours after the procedure unless otherwise instructed by your caregiver.  What to expect:  Any bruising will usually fade within 1 to 2 weeks.   Blood that collects in the tissue (hematoma) may be painful to the touch. It should usually decrease in size and tenderness within 1 to 2 weeks.  SEEK IMMEDIATE MEDICAL CARE IF:  You have unusual pain at the groin site or down the affected leg.   You have redness, warmth, swelling, or pain at the groin site.   You have drainage (other than a small amount of blood on the dressing).   You have chills.   You have a fever or persistent symptoms for more than 72 hours.   You have a fever and your symptoms suddenly get worse.   Your leg becomes pale, cool, tingly, or numb.  You have heavy bleeding from the site. Hold pressure on the site. .  Chest Pain Observation It is often hard to give a specific diagnosis for the cause of chest pain. Among other possibilities your symptoms might be caused by inadequate  oxygen delivery to your heart (angina). Angina that is not treated or evaluated can lead to a heart attack (myocardial infarction) or death. Blood tests, electrocardiograms, and X-rays may have been done to help determine a possible cause of your chest pain. After evaluation and observation, your health care provider has determined that it is unlikely your pain was caused by an unstable condition that requires hospitalization. However, a full evaluation of your pain may need to be completed, with additional diagnostic testing as directed. It is very important to keep your follow-up appointments. Not keeping your follow-up appointments could result in permanent heart damage, disability, or death. If there is any problem keeping your follow-up appointments, you must call your health care provider. HOME CARE INSTRUCTIONS  Due to the slight chance that your pain could be angina, it is important to follow your health care provider's treatment plan and also maintain a healthy lifestyle:  Maintain or work toward achieving a healthy weight.  Stay physically active and exercise regularly.  Decrease your salt intake.  Eat a balanced, healthy diet. Talk to a dietitian to learn about heart-healthy foods.  Increase your fiber intake by including whole grains, vegetables, fruits, and nuts in your diet.  Avoid situations that cause stress, anger, or depression.  Take medicines as advised by your health care provider. Report any side effects to your health care provider. Do not stop medicines  or adjust the dosages on your own.  Quit smoking. Do not use nicotine patches or gum until you check with your health care provider.  Keep your blood pressure, blood sugar, and cholesterol levels within normal limits.  Limit alcohol intake to no more than 1 drink per day for women who are not pregnant and 2 drinks per day for men.  Do not abuse drugs. SEEK IMMEDIATE MEDICAL CARE IF: You have severe chest pain or  pressure which may include symptoms such as:  You feel pain or pressure in your arms, neck, jaw, or back.  You have severe back or abdominal pain, feel sick to your stomach (nauseous), or throw up (vomit).  You are sweating profusely.  You are having a fast or irregular heartbeat.  You feel short of breath while at rest.  You notice increasing shortness of breath during rest, sleep, or with activity.  You have chest pain that does not get better after rest or after taking your usual medicine.  You wake from sleep with chest pain.  You are unable to sleep because you cannot breathe.  You develop a frequent cough or you are coughing up blood.  You feel dizzy, faint, or experience extreme fatigue.  You develop severe weakness, dizziness, fainting, or chills. Any of these symptoms may represent a serious problem that is an emergency. Do not wait to see if the symptoms will go away. Call your local emergency services (911 in the [place] Do not drive yourself to the hospital. MAKE SURE YOU:  Understand these instructions.  Will watch your condition.  Will get help right away if you are not doing well or get worse. Document Released: 11/02/2010 Document Revised: 10/05/2013 Document Reviewed: 04/01/2013 [facility] Patient Information 2015 [place], [place]. This information is not intended to replace advice given to you by your health care provider. Make sure you discuss any questions you have with your health care provider.  Aspirin and Your Heart Aspirin affects the way your blood clots and helps "thin" the blood. Aspirin has many uses in heart disease. It may be used as a primary prevention to help reduce the risk of heart related events. It also can be used as a secondary measure to prevent more heart attacks or to prevent additional damage from blood clots.  ASPIRIN MAY HELP IF YOU:  Have had a heart attack or chest pain.  Have undergone open heart surgery such as CABG (Coronary  Artery Bypass Surgery).  Have had coronary angioplasty with or without stents.  Have experienced a stroke or TIA (transient ischemic attack).  Have peripheral vascular disease (PAD).  Have chronic heart rhythm problems such as atrial fibrillation.  Are at risk for heart disease. BEFORE STARTING ASPIRIN Before you start taking aspirin, your caregiver will need to review your medical history. Many things will need to be taken into consideration, such as:  Smoking status.  Blood pressure.  Diabetes.  Gender.  Weight.  Cholesterol level. ASPIRIN DOSES  Aspirin should only be taken on the advice of your caregiver. Talk to your caregiver about how much aspirin you should take. Aspirin comes in different doses such as:  81 mg.  162 mg.  325 mg.  The aspirin dose you take may be affected by many factors, some of which include:  Your current medications, especially if your are taking blood-thinners or anti-platelet medicine.  Liver function.  Heart disease risk.  Age.  Aspirin comes in two forms:  Non-enteric-coated. This type of aspirin does  not have a coating and is absorbed faster. Non-enteric coated aspirin is recommended for patients experiencing chest pain symptoms. This type of aspirin also comes in a chewable form.  Enteric-coated. This means the aspirin has a special coating that releases the medicine very slowly. Enteric-coated aspirin causes less stomach upset. This type of aspirin should not be chewed or crushed. ASPIRIN SIDE EFFECTS Daily use of aspirin can increase your risk of serious side effects. Some of these include:  Increased bleeding. This can range from a cut that does not stop bleeding to more serious problems such as stomach bleeding or bleeding into the brain (Intracerebral bleeding).  Increased bruising.  Stomach upset.  An allergic reaction such as red, itchy skin.  Increased risk of bleeding when combined with non-steroidal  anti-inflammatory medicine (NSAIDS).  Alcohol should be drank in moderation when taking aspirin. Alcohol can increase the risk of stomach bleeding when taken with aspirin.  Aspirin should not be given to children less than 82 years of age due to the association of Reye syndrome. Reye syndrome is a serious illness that can affect the brain and liver. Studies have linked Reye syndrome with aspirin use in children.  People that have nasal polyps have an increased risk of developing an aspirin allergy. SEEK MEDICAL CARE IF:   You develop an allergic reaction such as:  Hives.  Itchy skin.  Swelling of the lips, tongue or face.  You develop stomach pain.  You have unusual bleeding or bruising.  You have ringing in your ears. SEEK IMMEDIATE MEDICAL CARE IF:   You have severe chest pain, especially if the pain is crushing or pressure-like and spreads to the arms, back, neck, or jaw. THIS IS AN EMERGENCY. Do not wait to see if the pain will go away. Get medical help at once. Call your local emergency services (911 in the [place] DO NOT drive yourself to the hospital.  You have stroke-like symptoms such as:  Loss of vision.  Difficulty talking.  Numbness or weakness on one side of your body.  Numbness or weakness in your arm or leg.  Not thinking clearly or feeling confused.  Your bowel movements are bloody, dark red or black in color.  You vomit or cough up blood.  You have blood in your urine.  You have shortness of breath, coughing or wheezing. MAKE SURE YOU:   Understand these instructions.  Will monitor your condition.  Seek immediate medical care if necessary. Document Released: 09/12/2008 Document Revised: 01/25/2013 Document Reviewed: 01/05/2014 [facility] Patient Information 2015 [place], [place]. This information is not intended to replace advice given to you by your health care provider. Make sure you discuss any questions you have with your health care provider.

## 2014-12-20 NOTE — Telephone Encounter (Signed)
-----   Message from Candis Schatz sent at 12/20/2014  8:55 AM EST ----- Pt has a TOC appt on Monday, being discharged today. Please have someone call the pt in the next 48 hrs.  Thanks Trisha

## 2014-12-20 NOTE — Progress Notes (Signed)
Pt is ready for DC home accompanied by wife. Pt reports he understands all DC medications, instructions, and follow up appointments.   Prescilla Sours, Therapist, sports

## 2014-12-20 NOTE — Progress Notes (Signed)
Medicare Important Message given? YES   (If response is "NO", the following Medicare IM given date fields will be blank)   Date Medicare IM given:   Medicare IM given by: Graves-Bigelow, Curtis Cain  

## 2014-12-20 NOTE — Progress Notes (Addendum)
Patient Name: Antonio [name] Date of Encounter: 12/20/2014     Principal Problem:   Unstable angina Active Problems:   Diabetes   HYPERCHOLESTEROLEMIA   Essential hypertension   Coronary atherosclerosis   CAD (coronary artery disease) of artery bypass graft    SUBJECTIVE  No CP or SOB. Reassuring cath yesterday. Left groin normal. Thankful.   CURRENT MEDS . aspirin EC  81 mg Oral Daily  . atorvastatin  40 mg Oral q1800  . insulin aspart  0-5 Units Subcutaneous QHS  . insulin aspart  0-9 Units Subcutaneous TID WC  . insulin glargine  15 Units Subcutaneous q morning - 10a  . metoprolol succinate  25 mg Oral Daily  . multivitamin with minerals  1 tablet Oral Daily  . nitroGLYCERIN  0.5 inch Topical 4 times per day  . pantoprazole  40 mg Oral BID  . prasugrel  10 mg Oral Daily    OBJECTIVE  Filed Vitals:   12/19/14 2050 12/19/14 2120 12/20/14 0022 12/20/14 0500  BP: 118/66 122/70 112/73 138/86  Pulse: 92  89 79  Temp:   98.3 F (36.8 C) 98.3 F (36.8 C)  TempSrc:   Oral Oral  Resp:   18 18  Height:      Weight:    187 lb 14.4 oz (85.231 kg)  SpO2: 100%  94% 98%    Intake/Output Summary (Last 24 hours) at 12/20/14 0818 Last data filed at 12/20/14 0538  Gross per 24 hour  Intake    548 ml  Output   1125 ml  Net   -577 ml   Filed Weights   12/17/14 1619 12/18/14 0500 12/20/14 0500  Weight: 189 lb 8 oz (85.957 kg) 188 lb 11.2 oz (85.594 kg) 187 lb 14.4 oz (85.231 kg)    PHYSICAL EXAM  General: Pleasant, NAD. Neuro: Alert and oriented X 3. Moves all extremities spontaneously. Psych: Normal affect. HEENT:  Normal  Neck: Supple without bruits or JVD. Lungs:  Resp regular and unlabored, CTA. Heart: RRR no s3, s4, or murmurs. Abdomen: Soft, non-tender, non-distended, BS + x 4.  Extremities: No clubbing, cyanosis or edema. DP/PT/Radials 2+ and equal bilaterally. Left groin cath site normal. No hematoma. Normal distal pulses.  Accessory Clinical  Findings  CBC  Recent Labs  12/17/14 1007  12/19/14 0615 12/20/14 0535  WBC 5.6  < > 5.2 7.0  NEUTROABS 3.8  --   --   --   HGB 14.6  < > 13.2 12.3*  HCT 42.2  < > 38.8* 36.1*  MCV 85.3  < > 85.3 85.1  PLT 150  < > 147* 149*  < > = values in this interval not displayed. Basic Metabolic Panel  Recent Labs  12/17/14 1640 12/18/14 0314 12/19/14 0615  NA  --  137 134*  K  --  3.8 3.7  CL  --  104 102  CO2  --  27 28  GLUCOSE  --  140* 246*  BUN  --  12 12  CREATININE  --  1.29 1.25  CALCIUM  --  8.5 8.3*  MG 1.7  --   --    Liver Function Tests  Recent Labs  12/17/14 1007  AST 36  ALT 29  ALKPHOS 66  BILITOT 0.8  PROT 6.9  ALBUMIN 3.8    Cardiac Enzymes  Recent Labs  12/17/14 1640 12/17/14 2312 12/18/14 0314  TROPONINI <0.03 <0.03 <0.03    Fasting Lipid Panel  Recent Labs  12/18/14 0314  CHOL 167  HDL 32*  LDLCALC 107*  TRIG 142  CHOLHDL 5.2   Thyroid Function Tests  Recent Labs  12/17/14 1640  TSH 0.932    TELE  NSR  Radiology/Studies  Dg Chest 2 View  12/17/2014   CLINICAL DATA:  Left-sided chest pain for 3 weeks. Shortness of breath. History of CABG in 1996.  EXAM: CHEST  2 VIEW  COMPARISON:  11/17/2013  FINDINGS: Median sternotomy for CABG. Midline trachea. Borderline cardiomegaly with a tortuous descending thoracic aorta. Lower cervical spine fixation. No pleural effusion or pneumothorax. No congestive failure. No lobar consolidation.  IMPRESSION: No acute cardiopulmonary disease.   Electronically Signed   By: Abigail Miyamoto M.D.   On: 12/17/2014 11:27    ASSESSMENT AND PLAN  Antonio [name] is a 69 y.o. male with a history of DM, HTN, HLD, CAD s/p CABG (1996) and hx of GI bleed who was admitted to [facility] on 12/17/14 with unstable angina. Cath reassuring. No change.    Unstable agnina  -troponin neg x3 - continue ASA and effient, BB, statin. IV heparin  - P2y12 ordered which reveals good platelet inhibition  (64) - cardiac catheterization no change from last year. Med mgt.   HTN- moderate control. Had episode of hypotension after cath (?Vagal). Reassuring overnight. Continue toprol xl 25mg  qd. Will hold losartan 100mg /HCTZ 25mg  since BP remains normal and expect to add back on as outpatient.   HLD- LDL 107. Continue statin  DM- continue SSI  H/o GI bleed: currently hgb normal. Slight decrease from prior but no active signs of bleeding. No back pain. No hematoma. Monitor clinically.   OK for DC with 7 day TOC follow up.  Will need work note for yesterday and today.   Candee Furbish, MD

## 2014-12-20 NOTE — Discharge Summary (Signed)
Discharge Summary   Patient ID: Antonio [name] MRN: [id number], DOB/AGE: 1945-10-16 69 y.o. Admit date: 12/17/2014 D/C date:     12/20/2014  Primary Cardiologist: Dr. Martinique  Principal Problem:   Unstable angina Active Problems:   Diabetes   HYPERCHOLESTEROLEMIA   Alcohol abuse, in remission   Essential hypertension   BPH (benign prostatic hyperplasia)   CAD (coronary artery disease)   History of lower GI bleeding   Erectile dysfunction   Admission Dates: 12/17/14-12/20/14 Discharge Diagnosis: [place] s/p [facility] with patent bypass grafts and good LV function  HPI: Rafiel [name] is a 69 y.o. male with a history of DM, HTN, HLD, CAD s/p CABG (1996) and hx of GI bleed who was admitted to [facility] on 12/17/14 with unstable angina.   Last catheterization in February 2015 showed an occluded LAD, severe disease in the obtuse marginal and an occluded RCA. The LIMA to the LAD was patent, saphenous vein graft to the diagonal patent, jump graft to the first and second marginals patent and patient saphenous vein graft to the PDA patent. Patient's ejection fraction was 55%. Echocardiogram in February 2015 showed normal LV function, grade 1 diastolic dysfunction, mild left atrial enlargement and mild mitral regurgitation. Patient presented complaining of approximately 3 weeks of mild, continuous chest pain that was worse with exertion. There was associated dyspnea but no nausea or diaphoresis.   Hospital Course  Unstable agnina -- Troponin neg x3. ECG w/ no acute ST or TW changes. -- P2y12 ordered which reveals good platelet inhibition (64).  -- He underwent re-look cardiac catheterization on 12/19/14 which revealed no change from last year with patent bypass grafts and good LV function. Continue med mgt.  -- Continue ASA/Effient, statin and BB  HTN- moderate control. Had episode of hypotension after cath (?Vagal). Reassuring overnight. Continue toprol xl 25mg  qd. -- Will hold losartan 100mg /HCTZ 25mg   since BP remains normal and expect to add back on as outpatient  HLD- LDL 107. Continue statin  DM- Hg A1c 8.5 on 12/17/14. Continue home regimen, but hold Sitagliptin-metformin combo drug until at least 48 hours post contrast dye exposure. He can resume on 12/21/13 AM. -- Needs better glucose control. F/U with PCP.  H/o GI bleed: currently hgb normal. Slight decrease from prior but no active signs of bleeding. No back pain. No hematoma. Monitor clinically.   ED- he used to take cialis, but no longer does due to cost. If he resumes in the future, he is aware of the interaction with NTG.  The patient has had an uncomplicated hospital course and is recovering well. The femoral catheter site is stable. He has been seen by Dr. Marlou Porch today and deemed ready for discharge home. All follow-up appointments have been scheduled. A work excuse note was provided as well. Discharge medications are listed below.   Discharge Vitals: Blood pressure 138/86, pulse 79, temperature 98.3 F (36.8 C), temperature source Oral, resp. rate 18, height 5' 7.5" (1.715 m), weight 187 lb 14.4 oz (85.231 kg), SpO2 98 %.  Labs: Lab Results  Component Value Date   WBC 7.0 12/20/2014   HGB 12.3* 12/20/2014   HCT 36.1* 12/20/2014   MCV 85.1 12/20/2014   PLT 149* 12/20/2014    Recent Labs Lab 12/17/14 1007  12/19/14 0615  NA 136  < > 134*  K 3.4*  < > 3.7  CL 103  < > 102  CO2 25  < > 28  BUN 10  < > 12  CREATININE  1.29  < > 1.25  CALCIUM 9.1  < > 8.3*  PROT 6.9  --   --   BILITOT 0.8  --   --   ALKPHOS 66  --   --   ALT 29  --   --   AST 36  --   --   GLUCOSE 203*  < > 246*  < > = values in this interval not displayed.  Recent Labs  12/17/14 1640 12/17/14 2312 12/18/14 0314  TROPONINI <0.03 <0.03 <0.03   Lab Results  Component Value Date   CHOL 167 12/18/2014   HDL 32* 12/18/2014   LDLCALC 107* 12/18/2014   TRIG 142 12/18/2014   No results found for: DDIMER  Diagnostic Studies/Procedures    Dg Chest 2 View  12/17/2014   CLINICAL DATA:  Left-sided chest pain for 3 weeks. Shortness of breath. History of CABG in 1996.  EXAM: CHEST  2 VIEW  COMPARISON:  11/17/2013  FINDINGS: Median sternotomy for CABG. Midline trachea. Borderline cardiomegaly with a tortuous descending thoracic aorta. Lower cervical spine fixation. No pleural effusion or pneumothorax. No congestive failure. No lobar consolidation.  IMPRESSION: No acute cardiopulmonary disease.   Electronically Signed   By: Abigail Miyamoto M.D.   On: 12/17/2014 11:27      12/19/14 Cardiac Catheterization Procedure Note  Procedural Findings: Hemodynamics:  AO 167/104 mean 129 mm Hg LV 166/15 mm Hg    Left ventriculography: Left ventricular systolic function is abnormal, there is mid inferior akinesis. LVEF is estimated at 50-55%, there is moderate mitral regurgitation   Final Conclusions:   1. Severe 3 vessel obstructive CAD 2. Patent LIMA to the LAD 3. Patent SVG to OM1 and OM2 4. Patent SVG to diagonal 5. Patent SVG to distal RCA 6. Good LV function.  Recommendations: There is no significant change from cardiac cath one year ago. Continue medical therapy. Anticipate DC in am. Will need to stay out of work for one week.   Discharge Medications     Medication List    STOP taking these medications        losartan-hydrochlorothiazide 100-25 MG per tablet  Commonly known as:  HYZAAR     metoprolol tartrate 25 MG tablet  Commonly known as:  LOPRESSOR     tadalafil 10 MG tablet  Commonly known as:  CIALIS      TAKE these medications        aspirin EC 81 MG tablet  Take 81 mg by mouth daily.     Insulin Pen Needle 32G X 4 MM Misc  Commonly known as:  BD PEN NEEDLE NANO U/F  1 pen by Does not apply route daily.     LANTUS SOLOSTAR 100 UNIT/ML Solostar Pen  Generic drug:  Insulin Glargine  Inject 20 Units into the skin every morning.     metoprolol succinate 25 MG 24 hr tablet  Commonly known as:  TOPROL-XL   Take 1 tablet (25 mg total) by mouth daily.     multivitamin with minerals Tabs tablet  Take 1 tablet by mouth daily.     nitroGLYCERIN 0.4 MG SL tablet  Commonly known as:  NITROSTAT  Place 1 tablet (0.4 mg total) under the tongue every 5 (five) minutes x 3 doses as needed for chest pain.     pantoprazole 40 MG tablet  Commonly known as:  PROTONIX  Take 1 tablet (40 mg total) by mouth 2 (two) times daily.     prasugrel 10  MG Tabs tablet  Commonly known as:  EFFIENT  Take 1 tablet (10 mg total) by mouth daily.     simvastatin 80 MG tablet  Commonly known as:  ZOCOR  Take 0.5 tablets (40 mg total) by mouth every morning.     SitaGLIPtin-MetFORMIN HCl [phone] MG Tb24  Take 1 tablet by mouth once.  Start taking on:  12/22/2014     traMADol 50 MG tablet  Commonly known as:  ULTRAM  Take 1 tablet (50 mg total) by mouth every 6 (six) hours as needed.        Disposition   The patient will be discharged in stable condition to home.  Follow-up Information    Follow up with [name], NP On 12/26/2014.   Specialty:  Nurse Practitioner   Why:  @ 8:30am   Contact information:   [address] [place] [address] [phone]         Duration of Discharge Encounter: Greater than 30 minutes including physician and PA time.  Mable Fill R PA-C 12/20/2014, 10:33 AM

## 2014-12-21 ENCOUNTER — Telehealth: Payer: Self-pay | Admitting: Cardiology

## 2014-12-21 NOTE — Telephone Encounter (Signed)
Patient contacted regarding discharge from [facility] on 3/8  Patient understands to follow up with provider ? [name] on 12/26/2014 @ 8:30p  Patient understands discharge instructions? Yes  Patient understands medications and regiment? Yes  Patient understands to bring all medications to this visit? Yes   Reviewed in detail pt medications; specifically spoke about NOT taking Metoprolol tartrate 25 mg tablet and STARTING Metoprolol Succinate 25 mg qdaily.  Pt had not picked up Metoprolol Succinate from pharmacy and was instructed it is important for him to pick up as soon as possible as it is an essential medication for his Blood Pressure.  Pt agreed he or his wife would pick up today, verified pharmacy where Rx was sent. Pt given our office number in case has questions, as could not find D/C paper work at this time; Pt agreed to call if has any questions or concerns.

## 2014-12-21 NOTE — Telephone Encounter (Signed)
New message    Patient calling need to discuss medication.     Pt c/o medication issue:  1. Name of Medication: metformin [phone] mg   2. How are you currently taking this medication (dosage and times per day)? Patient did not state   3. Are you having a reaction (difficulty breathing--STAT)? No   4. What is your medication issue? Will discuss when the nurse calls back.

## 2014-12-22 NOTE — Telephone Encounter (Signed)
Returned call to patient no answer.LMTC. 

## 2014-12-26 ENCOUNTER — Ambulatory Visit (INDEPENDENT_AMBULATORY_CARE_PROVIDER_SITE_OTHER): Payer: Medicare Other | Admitting: Nurse Practitioner

## 2014-12-26 ENCOUNTER — Encounter: Payer: Self-pay | Admitting: Nurse Practitioner

## 2014-12-26 ENCOUNTER — Ambulatory Visit (INDEPENDENT_AMBULATORY_CARE_PROVIDER_SITE_OTHER): Payer: Medicare Other | Admitting: Pharmacist

## 2014-12-26 VITALS — BP 120/90 | HR 79 | Ht 67.5 in | Wt 185.0 lb

## 2014-12-26 DIAGNOSIS — Z9889 Other specified postprocedural states: Secondary | ICD-10-CM

## 2014-12-26 DIAGNOSIS — I25709 Atherosclerosis of coronary artery bypass graft(s), unspecified, with unspecified angina pectoris: Secondary | ICD-10-CM

## 2014-12-26 DIAGNOSIS — E7800 Pure hypercholesterolemia, unspecified: Secondary | ICD-10-CM

## 2014-12-26 DIAGNOSIS — I10 Essential (primary) hypertension: Secondary | ICD-10-CM

## 2014-12-26 DIAGNOSIS — I200 Unstable angina: Secondary | ICD-10-CM

## 2014-12-26 MED ORDER — ATORVASTATIN CALCIUM 40 MG PO TABS: 40.0000 mg | ORAL_TABLET | Freq: Every day | ORAL | Status: DC
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
## 2014-12-26 NOTE — Patient Instructions (Addendum)
Stay on your current medicines but stop the Simvastatin and start lipitor 40 mg each evening - this is at your drug store.   See Dr. Martinique in August as planned  See me in 6 weeks with fasting labs  Make an appointment to see Dr. Mancel Bale for your diabetes.   Call the [place] office at [phone] if you have any questions, problems or concerns.

## 2014-12-26 NOTE — Progress Notes (Signed)
Pharmacy Transitions of Care Post-ACS Visit  Admit Complaint: Antonio [name] is a 69 yo male discharged on 12/20/14 with unstable angina. PMH is significant for severe 3 vessel CAD, DM2, HTN, BPH, history of GI bleed, HLD, alcohol abuse, and CABG in 1996. Patient presents to clinic for pharmacy medication reconciliation.  Medications and allergies were reviewed with patient. All medications were brought to today's appointment.  Current Outpatient Prescriptions  Medication Sig Dispense Refill  . aspirin EC 81 MG tablet Take 81 mg by mouth daily.    Marland Kitchen atorvastatin (LIPITOR) 40 MG tablet Take 1 tablet (40 mg total) by mouth daily. 90 tablet 3  . betamethasone dipropionate (DIPROLENE) 0.05 % cream Apply 0.05 % topically daily.   0  . Insulin Glargine (LANTUS SOLOSTAR) 100 UNIT/ML Solostar Pen Inject 20 Units into the skin every morning.     . Insulin Pen Needle (BD PEN NEEDLE NANO U/F) 32G X 4 MM MISC 1 pen by Does not apply route daily. 30 each 11  . losartan-hydrochlorothiazide (HYZAAR) 100-25 MG per tablet Take 1 tablet by mouth daily.   0  . metoprolol succinate (TOPROL-XL) 25 MG 24 hr tablet Take 1 tablet (25 mg total) by mouth daily. 30 tablet 11  . Multiple Vitamin (MULTIVITAMIN WITH MINERALS) TABS Take 1 tablet by mouth daily.    . nitroGLYCERIN (NITROSTAT) 0.4 MG SL tablet Place 1 tablet (0.4 mg total) under the tongue every 5 (five) minutes x 3 doses as needed for chest pain. 25 tablet 3  . pantoprazole (PROTONIX) 40 MG tablet Take 1 tablet (40 mg total) by mouth 2 (two) times daily. 60 tablet 11  . prasugrel (EFFIENT) 10 MG TABS tablet Take 1 tablet (10 mg total) by mouth daily. 30 tablet 2  . SitaGLIPtin-MetFORMIN HCl [phone] MG TB24 Take 1 tablet by mouth once. (Patient taking differently: Take 1 tablet by mouth daily. ) 30 tablet 11  . traMADol (ULTRAM) 50 MG tablet Take 1 tablet (50 mg total) by mouth every 6 (six) hours as needed. (Patient taking differently: Take 50 mg by mouth every 6  (six) hours as needed for moderate pain. ) 30 tablet 1   No current facility-administered medications for this visit.    ACS Medication Checklist:     Medication      YES      NO N/A or C/I and Explanation  Beta Blocker [x]  []    ACEi or ARB [x]  []     High-dose statin    (atorvastatin 40 or 80mg , rosuvastatin 20 or 40mg ) []  [x]  Simvastatin 40mg  with no listed contraindications to high-intensity statin therapy.  Clopidogrel +/- other         anti-platelets [x]         []     Aspirin [x]  []    Nitroglycerin [x]  []     Smoking cessation counseling provided []  [x]  Patient used to smoke 1PPD but he quit 20-25 years ago.  Cardiac rehab referral []         [x]      Assessment/Plan: refer to chart below.    Interventions during today's visit include: Intervention     YES      NO  Explanation   Indications      Needs Therapy []  [x]     Unnecessary Therapy []  [x]    Efficacy     Suboptimal Drug Selection [x]  []  Patient taking simvastatin 40mg  daily. No intolerances with other statins. Lipid panel decent with TC 167 and LDL 107 but patient with ACS, history  of CABG, and severe 3 vessel CAD. Qualifies for high-intensity statin therapy.  Insufficient Dose/Duration [x]  []  Janumet listed in medication list as "take once." Updated to reflect instructions for daily use. Patient reports daily adherence.   Safety      Adverse Drug Event []  [x]     Drug Interaction []  [x]     Excessive Dose/Duration []  [x]    Compliance     Underuse []  [x]     Overuse [x]  []  Patient reports that he continued to take metoprolol tartrate (only once a day) plus his metoprolol succinate. Vitals ok today (BP 120/90, HR 79). Patient instructed to just take metoprolol succinate once daily.  Also reports that he continued taking his losartan-HCTZ once he was discharged. Per D/C note, was supposed to be restarted in the outpatient setting. Vitals stable.   Medication changes today include discontinuing simvastatin 40mg  and initiating  atorvastatin 40mg  daily. Clarified medication regimen with patient and discussed proper use of and instructions for nitroglycerin.  Will follow up with patient by phone in 1 month to discuss adherence and any medication-related concerns.  Antonio [name], Pharm.D Clinical Pharmacy Resident Pager: [phone] 12/26/2014 9:35 AM

## 2014-12-27 NOTE — Telephone Encounter (Signed)
Returned call to patient he stated he had a question about metoprolol.Stated already taken care of he saw NP.

## 2015-01-20 DIAGNOSIS — C61 Malignant neoplasm of prostate: Secondary | ICD-10-CM | POA: Diagnosis not present

## 2015-01-20 DIAGNOSIS — R6882 Decreased libido: Secondary | ICD-10-CM | POA: Diagnosis not present

## 2015-01-20 DIAGNOSIS — E291 Testicular hypofunction: Secondary | ICD-10-CM | POA: Diagnosis not present

## 2015-01-20 DIAGNOSIS — N5201 Erectile dysfunction due to arterial insufficiency: Secondary | ICD-10-CM | POA: Diagnosis not present

## 2015-01-24 DIAGNOSIS — Z0000 Encounter for general adult medical examination without abnormal findings: Secondary | ICD-10-CM | POA: Diagnosis not present

## 2015-01-24 DIAGNOSIS — E1165 Type 2 diabetes mellitus with hyperglycemia: Secondary | ICD-10-CM | POA: Diagnosis not present

## 2015-01-25 DIAGNOSIS — E780 Pure hypercholesterolemia: Secondary | ICD-10-CM | POA: Diagnosis not present

## 2015-01-25 DIAGNOSIS — E1165 Type 2 diabetes mellitus with hyperglycemia: Secondary | ICD-10-CM | POA: Diagnosis not present

## 2015-02-03 ENCOUNTER — Ambulatory Visit (INDEPENDENT_AMBULATORY_CARE_PROVIDER_SITE_OTHER): Payer: Medicare Other | Admitting: Nurse Practitioner

## 2015-02-03 ENCOUNTER — Encounter: Payer: Self-pay | Admitting: Nurse Practitioner

## 2015-02-03 VITALS — BP 110/70 | HR 76 | Ht 67.0 in | Wt 184.0 lb

## 2015-02-03 DIAGNOSIS — I25709 Atherosclerosis of coronary artery bypass graft(s), unspecified, with unspecified angina pectoris: Secondary | ICD-10-CM

## 2015-02-03 DIAGNOSIS — E7800 Pure hypercholesterolemia, unspecified: Secondary | ICD-10-CM

## 2015-02-03 DIAGNOSIS — I10 Essential (primary) hypertension: Secondary | ICD-10-CM

## 2015-02-03 LAB — BASIC METABOLIC PANEL
BUN: 17 mg/dL (ref 6–23)
CO2: 31 mEq/L (ref 19–32)
Calcium: 9.5 mg/dL (ref 8.4–10.5)
Chloride: 104 mEq/L (ref 96–112)
Creatinine, Ser: 1.36 mg/dL (ref 0.40–1.50)
GFR: 67.34 mL/min (ref >60.00)
Glucose, Bld: 77 mg/dL (ref 70–99)
Potassium: 3.5 mEq/L (ref 3.5–5.1)
Sodium: 139 mEq/L (ref 135–145)

## 2015-02-03 LAB — HEPATIC FUNCTION PANEL
ALT: 23 U/L (ref 0–53)
AST: 21 U/L (ref 0–37)
Albumin: 3.8 g/dL (ref 3.5–5.2)
Alkaline Phosphatase: 65 U/L (ref 39–117)
Bilirubin, Direct: 0.1 mg/dL (ref 0.0–0.3)
Total Bilirubin: 0.4 mg/dL (ref 0.2–1.2)
Total Protein: 7.4 g/dL (ref 6.0–8.3)

## 2015-02-03 LAB — LIPID PANEL
Cholesterol: 112 mg/dL (ref 0–200)
HDL: 30.7 mg/dL — ABNORMAL LOW (ref >39.00)
LDL Cholesterol: 56 mg/dL (ref 0–99)
NonHDL: 81.3
Total CHOL/HDL Ratio: 4
Triglycerides: 125 mg/dL (ref 0.0–149.0)
VLDL: 25 mg/dL (ref 0.0–40.0)

## 2015-02-03 MED ORDER — ISOSORBIDE MONONITRATE ER 30 MG PO TB24: 30.0000 mg | ORAL_TABLET | Freq: Every day | ORAL | Status: DC

## 2015-02-03 NOTE — Patient Instructions (Addendum)
We will be checking the following labs today - BMET, Lipids & HPF   Medication Instructions:    Continue with your current medicines.  I am adding Imdur 30 mg to take every day - this may cause the headache we talked about     Testing/Procedures To Be Arranged:  N/A  Follow-Up:   See Dr. Martinique in 2 months  See me in 3 weeks    Other Special Instructions:   N/A  Call the [place] office at [phone] if you have any questions, problems or concerns.
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
## 2015-03-10 ENCOUNTER — Encounter: Payer: Self-pay | Admitting: Nurse Practitioner

## 2015-03-10 ENCOUNTER — Ambulatory Visit (INDEPENDENT_AMBULATORY_CARE_PROVIDER_SITE_OTHER): Payer: Medicare Other | Admitting: Nurse Practitioner

## 2015-03-10 VITALS — BP 136/74 | HR 72 | Ht 67.5 in | Wt 188.4 lb

## 2015-03-10 DIAGNOSIS — E7800 Pure hypercholesterolemia, unspecified: Secondary | ICD-10-CM

## 2015-03-10 DIAGNOSIS — I25709 Atherosclerosis of coronary artery bypass graft(s), unspecified, with unspecified angina pectoris: Secondary | ICD-10-CM

## 2015-03-10 DIAGNOSIS — I10 Essential (primary) hypertension: Secondary | ICD-10-CM | POA: Diagnosis not present

## 2015-03-10 MED ORDER — ISOSORBIDE MONONITRATE ER 60 MG PO TB24: 60.0000 mg | ORAL_TABLET | Freq: Every day | ORAL | Status: DC

## 2015-03-10 NOTE — Patient Instructions (Addendum)
We will be checking the following labs today - NONE   Medication Instructions:    Continue with your current medicines but I am   Increasing the Imdur to 60 mg a day - this is at the drug store - take 2 of your 30 mg tablets to = this dose    Testing/Procedures To Be Arranged:  N/A  Follow-Up:   See Dr. Martinique as planned    Other Special Instructions:   N/A  Call the [place] office at [phone] if you have any questions, problems or concerns.
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
## 2015-03-28 ENCOUNTER — Encounter: Payer: Self-pay | Admitting: Cardiology

## 2015-03-28 ENCOUNTER — Ambulatory Visit (INDEPENDENT_AMBULATORY_CARE_PROVIDER_SITE_OTHER): Payer: Medicare Other | Admitting: Cardiology

## 2015-03-28 ENCOUNTER — Ambulatory Visit: Payer: Medicare Other | Admitting: Cardiology

## 2015-03-28 VITALS — BP 138/90 | HR 72 | Ht 67.0 in | Wt 186.0 lb

## 2015-03-28 DIAGNOSIS — I10 Essential (primary) hypertension: Secondary | ICD-10-CM | POA: Diagnosis not present

## 2015-03-28 DIAGNOSIS — E7800 Pure hypercholesterolemia, unspecified: Secondary | ICD-10-CM

## 2015-03-28 DIAGNOSIS — I25708 Atherosclerosis of coronary artery bypass graft(s), unspecified, with other forms of angina pectoris: Secondary | ICD-10-CM

## 2015-03-28 MED ORDER — TRAMADOL HCL 50 MG PO TABS: 50.0000 mg | ORAL_TABLET | Freq: Four times a day (QID) | ORAL | Status: DC | PRN

## 2015-03-28 NOTE — Patient Instructions (Signed)
Continue your current therapy  I will see you in 6 months.   
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
## 2015-03-31 ENCOUNTER — Ambulatory Visit: Payer: Medicare Other | Admitting: Cardiology

## 2015-03-31 ENCOUNTER — Other Ambulatory Visit: Payer: Self-pay | Admitting: *Deleted

## 2015-03-31 MED ORDER — PRASUGREL HCL 10 MG PO TABS: 10.0000 mg | ORAL_TABLET | Freq: Every day | ORAL | Status: DC

## 2015-08-09 ENCOUNTER — Observation Stay (HOSPITAL_COMMUNITY)
Admission: EM | Admit: 2015-08-09 | Discharge: 2015-08-09 | Disposition: A | Discharge status: Home / Self Care | Payer: Medicare HMO | Attending: Internal Medicine | Admitting: Internal Medicine

## 2015-08-09 ENCOUNTER — Emergency Department (HOSPITAL_COMMUNITY): Payer: Medicare HMO

## 2015-08-09 ENCOUNTER — Other Ambulatory Visit (HOSPITAL_COMMUNITY): Payer: Medicare Other

## 2015-08-09 ENCOUNTER — Encounter (HOSPITAL_COMMUNITY): Payer: Self-pay | Admitting: Emergency Medicine

## 2015-08-09 DIAGNOSIS — Z8546 Personal history of malignant neoplasm of prostate: Secondary | ICD-10-CM | POA: Diagnosis not present

## 2015-08-09 DIAGNOSIS — I2510 Atherosclerotic heart disease of native coronary artery without angina pectoris: Secondary | ICD-10-CM | POA: Diagnosis not present

## 2015-08-09 DIAGNOSIS — R079 Chest pain, unspecified: Secondary | ICD-10-CM | POA: Diagnosis not present

## 2015-08-09 DIAGNOSIS — N182 Chronic kidney disease, stage 2 (mild): Secondary | ICD-10-CM | POA: Diagnosis not present

## 2015-08-09 DIAGNOSIS — E1122 Type 2 diabetes mellitus with diabetic chronic kidney disease: Secondary | ICD-10-CM

## 2015-08-09 DIAGNOSIS — E876 Hypokalemia: Secondary | ICD-10-CM

## 2015-08-09 DIAGNOSIS — R42 Dizziness and giddiness: Secondary | ICD-10-CM | POA: Diagnosis not present

## 2015-08-09 DIAGNOSIS — I10 Essential (primary) hypertension: Secondary | ICD-10-CM | POA: Diagnosis not present

## 2015-08-09 DIAGNOSIS — R0602 Shortness of breath: Secondary | ICD-10-CM | POA: Diagnosis not present

## 2015-08-09 DIAGNOSIS — Z87891 Personal history of nicotine dependence: Secondary | ICD-10-CM | POA: Diagnosis not present

## 2015-08-09 DIAGNOSIS — R110 Nausea: Secondary | ICD-10-CM | POA: Diagnosis not present

## 2015-08-09 DIAGNOSIS — K625 Hemorrhage of anus and rectum: Secondary | ICD-10-CM | POA: Diagnosis not present

## 2015-08-09 DIAGNOSIS — R0789 Other chest pain: Secondary | ICD-10-CM | POA: Diagnosis present

## 2015-08-09 DIAGNOSIS — E1159 Type 2 diabetes mellitus with other circulatory complications: Secondary | ICD-10-CM | POA: Diagnosis present

## 2015-08-09 DIAGNOSIS — E785 Hyperlipidemia, unspecified: Secondary | ICD-10-CM | POA: Insufficient documentation

## 2015-08-09 DIAGNOSIS — Z7982 Long term (current) use of aspirin: Secondary | ICD-10-CM | POA: Diagnosis not present

## 2015-08-09 DIAGNOSIS — N400 Enlarged prostate without lower urinary tract symptoms: Secondary | ICD-10-CM | POA: Insufficient documentation

## 2015-08-09 DIAGNOSIS — Z79899 Other long term (current) drug therapy: Secondary | ICD-10-CM | POA: Insufficient documentation

## 2015-08-09 DIAGNOSIS — K219 Gastro-esophageal reflux disease without esophagitis: Secondary | ICD-10-CM | POA: Diagnosis not present

## 2015-08-09 DIAGNOSIS — Z794 Long term (current) use of insulin: Secondary | ICD-10-CM

## 2015-08-09 DIAGNOSIS — N529 Male erectile dysfunction, unspecified: Secondary | ICD-10-CM | POA: Insufficient documentation

## 2015-08-09 DIAGNOSIS — E119 Type 2 diabetes mellitus without complications: Secondary | ICD-10-CM | POA: Diagnosis not present

## 2015-08-09 DIAGNOSIS — N189 Chronic kidney disease, unspecified: Secondary | ICD-10-CM | POA: Diagnosis present

## 2015-08-09 LAB — GLUCOSE, CAPILLARY
GLUCOSE-CAPILLARY: 127 mg/dL — AB (ref 65–99)
GLUCOSE-CAPILLARY: 153 mg/dL — AB (ref 65–99)
Glucose-Capillary: 85 mg/dL (ref 65–99)

## 2015-08-09 LAB — CBC
HCT: 39 % (ref 39.0–52.0)
HEMOGLOBIN: 12.9 g/dL — AB (ref 13.0–17.0)
MCH: 27.6 pg (ref 26.0–34.0)
MCHC: 33.1 g/dL (ref 30.0–36.0)
MCV: 83.5 fL (ref 78.0–100.0)
PLATELETS: 149 10*3/uL — AB (ref 150–400)
RBC: 4.67 MIL/uL (ref 4.22–5.81)
RDW: 15.8 % — ABNORMAL HIGH (ref 11.5–15.5)
WBC: 6.2 10*3/uL (ref 4.0–10.5)

## 2015-08-09 LAB — TROPONIN I: Troponin I: <0.03 ng/mL (ref <0.031)

## 2015-08-09 LAB — BASIC METABOLIC PANEL
ANION GAP: 11 (ref 5–15)
BUN: 19 mg/dL (ref 6–20)
CHLORIDE: 101 mmol/L (ref 101–111)
CO2: 27 mmol/L (ref 22–32)
CREATININE: 1.4 mg/dL — AB (ref 0.61–1.24)
Calcium: 9.4 mg/dL (ref 8.9–10.3)
GFR calc non Af Amer: 51 mL/min — ABNORMAL LOW (ref >60)
GFR, EST AFRICAN AMERICAN: 59 mL/min — AB (ref >60)
Glucose, Bld: 122 mg/dL — ABNORMAL HIGH (ref 65–99)
Potassium: 3 mmol/L — ABNORMAL LOW (ref 3.5–5.1)
Sodium: 139 mmol/L (ref 135–145)

## 2015-08-09 LAB — I-STAT TROPONIN, ED: TROPONIN I, POC: 0 ng/mL (ref 0.00–0.08)

## 2015-08-09 LAB — MAGNESIUM: Magnesium: 1.8 mg/dL (ref 1.7–2.4)

## 2015-08-09 MED ORDER — PRASUGREL HCL 10 MG PO TABS
10.0000 mg | ORAL_TABLET | Freq: Every day | ORAL | Status: DC
Start: 2015-08-09 — End: 2015-08-09
  Administered 2015-08-09: 10 mg via ORAL
  Filled 2015-08-09: qty 1

## 2015-08-09 MED ORDER — NITROGLYCERIN 0.4 MG SL SUBL: 0.4000 mg | SUBLINGUAL_TABLET | SUBLINGUAL | Status: DC | PRN

## 2015-08-09 MED ORDER — MORPHINE SULFATE (PF) 2 MG/ML IV SOLN: 2.0000 mg | INTRAVENOUS | Status: DC | PRN

## 2015-08-09 MED ORDER — NITROGLYCERIN 0.4 MG SL SUBL
0.4000 mg | SUBLINGUAL_TABLET | SUBLINGUAL | Status: DC | PRN
  Administered 2015-08-09 (×2): 0.4 mg via SUBLINGUAL
  Filled 2015-08-09 (×2): qty 1

## 2015-08-09 MED ORDER — POTASSIUM CHLORIDE CRYS ER 20 MEQ PO TBCR
40.0000 meq | EXTENDED_RELEASE_TABLET | Freq: Once | ORAL | Status: AC
  Administered 2015-08-09: 40 meq via ORAL
  Filled 2015-08-09: qty 2

## 2015-08-09 MED ORDER — ASPIRIN EC 81 MG PO TBEC
81.0000 mg | DELAYED_RELEASE_TABLET | Freq: Every day | ORAL | Status: DC
  Administered 2015-08-09: 81 mg via ORAL
  Filled 2015-08-09: qty 1

## 2015-08-09 MED ORDER — ONDANSETRON HCL 4 MG/2ML IJ SOLN
4.0000 mg | Freq: Four times a day (QID) | INTRAMUSCULAR | Status: DC | PRN
Start: 2015-08-09 — End: 2015-08-09

## 2015-08-09 MED ORDER — METOPROLOL SUCCINATE ER 25 MG PO TB24
25.0000 mg | ORAL_TABLET | Freq: Every day | ORAL | Status: DC
Start: 2015-08-09 — End: 2015-08-09
  Administered 2015-08-09: 25 mg via ORAL
  Filled 2015-08-09: qty 1

## 2015-08-09 MED ORDER — TRAMADOL HCL 50 MG PO TABS: 50.0000 mg | ORAL_TABLET | Freq: Four times a day (QID) | ORAL | Status: DC | PRN

## 2015-08-09 MED ORDER — AMLODIPINE BESYLATE 2.5 MG PO TABS
2.5000 mg | ORAL_TABLET | Freq: Every day | ORAL | Status: DC
  Administered 2015-08-09: 2.5 mg via ORAL
  Filled 2015-08-09: qty 1

## 2015-08-09 MED ORDER — INSULIN ASPART 100 UNIT/ML ~~LOC~~ SOLN: 0.0000 [IU] | Freq: Four times a day (QID) | SUBCUTANEOUS | Status: DC

## 2015-08-09 MED ORDER — PANTOPRAZOLE SODIUM 40 MG PO TBEC
40.0000 mg | DELAYED_RELEASE_TABLET | Freq: Two times a day (BID) | ORAL | Status: DC
  Administered 2015-08-09: 40 mg via ORAL
  Filled 2015-08-09: qty 1

## 2015-08-09 MED ORDER — ACETAMINOPHEN 325 MG PO TABS
650.0000 mg | ORAL_TABLET | ORAL | Status: DC | PRN
  Administered 2015-08-09: 650 mg via ORAL
  Filled 2015-08-09: qty 2

## 2015-08-09 MED ORDER — ISOSORBIDE MONONITRATE ER 60 MG PO TB24
60.0000 mg | ORAL_TABLET | Freq: Every day | ORAL | Status: DC
  Administered 2015-08-09: 60 mg via ORAL
  Filled 2015-08-09: qty 1

## 2015-08-09 MED ORDER — ATORVASTATIN CALCIUM 40 MG PO TABS
40.0000 mg | ORAL_TABLET | Freq: Every day | ORAL | Status: DC
  Administered 2015-08-09: 40 mg via ORAL
  Filled 2015-08-09: qty 1

## 2015-08-09 MED ORDER — ASPIRIN 81 MG PO CHEW
324.0000 mg | CHEWABLE_TABLET | Freq: Once | ORAL | Status: AC
  Administered 2015-08-09: 324 mg via ORAL
  Filled 2015-08-09: qty 4

## 2015-08-09 MED ORDER — HEPARIN SODIUM (PORCINE) 5000 UNIT/ML IJ SOLN: 5000.0000 [IU] | Freq: Three times a day (TID) | INTRAMUSCULAR | Status: DC

## 2015-08-09 MED ORDER — AMLODIPINE BESYLATE 2.5 MG PO TABS: 2.5000 mg | ORAL_TABLET | Freq: Every day | ORAL | Status: DC

## 2015-08-09 NOTE — ED Notes (Signed)
MD at bedside. 

## 2015-08-09 NOTE — Consult Note (Signed)
CARDIOLOGY CONSULT NOTE   Patient ID: Antonio [name] MRN: [id number] DOB/AGE: June 27, 1946 69 y.o.  Admit date: 08/09/2015  Primary Physician   Myriam Jacobson, MD Primary Cardiologist  Dr. Martinique Reason for Consultation   CP  HPI: Antonio [name] is a 69 y.o. male with a history of CAD s/p CABG (1996), DM, HTN, HLD, BPH, GERD and GI bleed who admitted early morning with CP.   Prior catheterization in February 2015 showed an occluded LAD, severe disease in the obtuse marginal and an occluded RCA. The LIMA to the LAD was patent, saphenous vein graft to the diagonal patent, jump graft to the first and second marginals patent and patient saphenous vein graft to the PDA patent. Patient's ejection fraction was 55%. Echocardiogram in February 2015 showed normal LV function, grade 1 diastolic dysfunction, mild left atrial enlargement and mild mitral regurgitation.   He was admitted to [facility] on 12/17/14 with unstable angina. Cardiac cath was unchanged (Severe 3 vessel obstructive CAD, patent LIMA to the LAD, patent SVG to OM1 and OM2, patent SVG to diagonal, p atent SVG to distal RCA). On follow up he continued to have chest pain with exertion. He was started on Imdur with resolution of these symptoms.   He was doing well on cardiac stand point when seen by Dr. Martinique 03/28/15. Since then patient had a mild chest discomfort with exertion, resolved with rest and nitroglycerin. This morning he woke up with a left-sided achy chest pain associated with diaphoresis and nausea. His pain radiated to left arm. He came to ED for further evaluation.  In ED, K of 3.0 creatinine of 1.4. point-of-care troponin negative. EKG nonischemic, per admission note (EKG has not loaded in EPIC yet also not in his chart). He was admitted for observation.   Past Medical History  Diagnosis Date  . Alcohol abuse, in remission   . HLD (hyperlipidemia)   . HTN (hypertension)   . CAD (coronary artery disease)     a. s/p  CABG 1996  b. LHC in 11/2013 w/ patent grafts. c. 12/19/14 re-look cath with patent grafts and good LVF  . BPH (benign prostatic hyperplasia)   . Prostate cancer ([place])     a. s/p radiation   . Type II diabetes mellitus ([place])   . Bleeding per rectum     "related to prostate cancer and hemorrhoids" (09/03/2012)  . GERD (gastroesophageal reflux disease)   . History of lower GI bleeding   . Erectile dysfunction      Past Surgical History  Procedure Laterality Date  . Cardiac catheterization  01/27/2009    ef 60%  . Coronary artery bypass graft  1996    LIMA GRAFT TO THE LAD, SAPHENOUS VEIN GRAFT SEQUENTIALLY TO THE FIRST AND SECOND OBTUSE MARGINAL VESSELS, SAPHENOUS VEIN GRAFT TO THE DIAGONAL, AND SAPHENOUS VEIN GRAFT TO THE DISTAL RIGHT CORONARY   . Laceration repair  1980's    LEFT HAND  . Foot surgery  1980's    LEFT, "shot a nail gun thru it" (09/03/2012)  . Cervical disc surgery  ? 1990's    "went in on the side" (09/03/2012)  . Cardiac catheterization  08/25/2012    Severe 3v obstructive CAD, continued graft patency (SVG-D,  SVG-OM1-OM2, SVG-PDA, LIMA-LAD). area of diffuse dz in the distal LCx (up to 90%) unamenable to PCI  . Esophagogastroduodenoscopy Left 11/19/2013    Procedure: ESOPHAGOGASTRODUODENOSCOPY (EGD);  Surgeon: Arta Silence, MD;  Location: [facility] ENDOSCOPY;  Service: Endoscopy;  Laterality: Left;  . Left heart catheterization with coronary angiogram N/A 09/04/2012    Procedure: LEFT HEART CATHETERIZATION WITH CORONARY ANGIOGRAM;  Surgeon: Kamila Broda M Martinique, MD;  Location: [facility] CATH LAB;  Service: Cardiovascular;  Laterality: N/A;  . Left heart catheterization with coronary/graft angiogram N/A 11/22/2013    Procedure: LEFT HEART CATHETERIZATION WITH Beatrix Fetters;  Surgeon: Jettie Booze, MD;  Location: [facility] CATH LAB;  Service: Cardiovascular;  Laterality: N/A;  . Left heart catheterization with coronary/graft angiogram N/A 12/19/2014    Procedure: LEFT HEART  CATHETERIZATION WITH Beatrix Fetters;  Surgeon: Britton Bera M Martinique, MD;  Location: [facility] CATH LAB;  Service: Cardiovascular;  Laterality: N/A;    Allergies  Allergen Reactions  . Demerol Other (See Comments)    hallucinations  . Meperidine And Related     I have reviewed the patient's current medications . aspirin EC  81 mg Oral Daily  . atorvastatin  40 mg Oral Daily  . heparin  5,000 Units Subcutaneous 3 times per day  . insulin aspart  0-15 Units Subcutaneous Q6H  . isosorbide mononitrate  60 mg Oral Daily  . metoprolol succinate  25 mg Oral Daily  . pantoprazole  40 mg Oral BID  . prasugrel  10 mg Oral Daily     acetaminophen, morphine injection, nitroGLYCERIN, ondansetron (ZOFRAN) IV, traMADol  Prior to Admission medications   Medication Sig Start Date End Date Taking? Authorizing Provider  aspirin EC 81 MG tablet Take 81 mg by mouth daily.    Historical Provider, MD  atorvastatin (LIPITOR) 40 MG tablet Take 1 tablet (40 mg total) by mouth daily. 12/26/14   Burtis Junes, NP  betamethasone dipropionate (DIPROLENE) 0.05 % cream Apply 0.05 % topically daily.  11/26/14   Historical Provider, MD  Insulin Glargine (LANTUS SOLOSTAR) 100 UNIT/ML Solostar Pen Inject 20 Units into the skin every morning.     Historical Provider, MD  Insulin Pen Needle (BD PEN NEEDLE NANO U/F) 32G X 4 MM MISC 1 pen by Does not apply route daily. 11/11/13   Kyra Leyland, PA-C  isosorbide mononitrate (IMDUR) 60 MG 24 hr tablet Take 1 tablet (60 mg total) by mouth daily. 03/10/15   Burtis Junes, NP  losartan-hydrochlorothiazide (HYZAAR) 100-25 MG per tablet Take 1 tablet by mouth daily.  11/19/14   Historical Provider, MD  metoprolol succinate (TOPROL-XL) 25 MG 24 hr tablet Take 1 tablet (25 mg total) by mouth daily. 12/20/14   Eileen Stanford, PA-C  Multiple Vitamin (MULTIVITAMIN WITH MINERALS) TABS Take 1 tablet by mouth daily.    Historical Provider, MD  nitroGLYCERIN (NITROSTAT) 0.4 MG SL tablet  Place 1 tablet (0.4 mg total) under the tongue every 5 (five) minutes x 3 doses as needed for chest pain. 10/05/14   Margean Korell M Martinique, MD  pantoprazole (PROTONIX) 40 MG tablet Take 1 tablet (40 mg total) by mouth 2 (two) times daily. 01/28/14   Biagio Borg, MD  prasugrel (EFFIENT) 10 MG TABS tablet Take 1 tablet (10 mg total) by mouth daily. 03/31/15   Diron Haddon M Martinique, MD  SitaGLIPtin-MetFORMIN HCl [phone] MG TB24 Take 1 tablet by mouth once. Patient taking differently: Take 1 tablet by mouth daily.  12/22/14   Eileen Stanford, PA-C  traMADol (ULTRAM) 50 MG tablet Take 1 tablet (50 mg total) by mouth every 6 (six) hours as needed for moderate pain. 03/28/15   Henretta Quist M Martinique, MD     Social History   Social History  .  Marital Status: Married    Spouse Name: N/A  . Number of Children: 1  . Years of Education: N/A   Occupational History  . 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 Smoking status: Former Smoker -- 1.00 packs/day for 37 years    Types: Cigarettes    Quit date: 10/14/2002  . Smokeless tobacco: Never Used  . Alcohol Use: No     Comment: 6 pack beer per week  . Drug Use: No  . Sexual Activity: Yes   Other Topics Concern  . Not on file   Social History Narrative   Regular exercise-yes    Family Status  Relation Status Death Age  . Mother Deceased   . Father Deceased    Family History  Problem Relation Age of Onset  . Diabetes Other     2 siblings, 1 of whom is deceased  . Hypertension Other   . Cancer Other     Lung Cancer  . Cancer Other     FH of Prostate Cancer 1st degree relative  . Cancer Mother     breast  . Hypertension Mother   . Hypertension Father   . CAD Brother      ROS:  Full 14 point review of systems complete and found to be negative unless listed above.  Physical Exam: Blood pressure 145/81, pulse 65, temperature 98.1 F (36.7 C), temperature source Oral, resp. rate 16, height 5\' 7"  (1.702 m), weight 177 lb 11.2 oz  (80.604 kg), SpO2 100 %.  General: Well developed, well nourished, male in no acute distress Head: Eyes PERRLA, No xanthomas. Normocephalic and atraumatic, oropharynx without edema or exudate.  Lungs: Resp regular and unlabored, CTA. Heart: RRR no s3, s4, or murmurs..   Neck: No carotid bruits. No lymphadenopathy.  JVD. Abdomen: Bowel sounds present, abdomen soft and non-tender without masses or hernias noted. Msk:  No spine or cva tenderness. No weakness, no joint deformities or effusions. Extremities: No clubbing, cyanosis or edema. DP/PT/Radials 2+ and equal bilaterally. Neuro: Alert and oriented X 3. No focal deficits noted. Psych:  Good affect, responds appropriately Skin: No rashes or lesions noted.  Labs:   Lab Results  Component Value Date   WBC 6.2 08/09/2015   HGB 12.9* 08/09/2015   HCT 39.0 08/09/2015   MCV 83.5 08/09/2015   PLT 149* 08/09/2015   No results for input(s): INR in the last 72 hours.  Recent Labs Lab 08/09/15 0320  NA 139  K 3.0*  CL 101  CO2 27  BUN 19  CREATININE 1.40*  CALCIUM 9.4  GLUCOSE 122*   MAGNESIUM  Date Value Ref Range Status  08/09/2015 1.8 1.7 - 2.4 mg/dL Final    Recent Labs  08/09/15 0733  TROPONINI <0.03    Recent Labs  08/09/15 0325  TROPIPOC 0.00   Echo: 11/18/14 LV EF: 50% -  55%  ------------------------------------------------------------ History:  PMH: Evaluate LV. Coronary artery disease. Risk factors: Lower GI bleed. ETOH. BPH. Former tobacco use. Hypertension. Diabetes mellitus. Dyslipidemia.  ------------------------------------------------------------ Study Conclusions  - Left ventricle: The cavity size was normal. There was mild concentric hypertrophy. Systolic function was normal. The estimated ejection fraction was in the range of 50% to 55%. Wall motion was normal; there were no regional wall motion abnormalities. Doppler parameters are consistent with abnormal left ventricular  relaxation (grade 1 diastolic dysfunction). - Mitral valve: Mild regurgitation. - Left atrium: The atrium was mildly dilated. Volume:  70.21ml (S). - Pulmonary arteries: Systolic pressure was mildly increased.   Cath 12/19/14 Coronary angiography: Coronary dominance: right  Left mainstem: Normal.   Left anterior descending (LAD): 100% occlusion proximally.  Left circumflex (LCx): 100% occlusion in the mid vessel. The first and second OM branches are occluded.   Right coronary artery (RCA): 100% occlusion in the mid vessel with bridging right to right collaterals.   SVG to the RCA is widely patent there is some tapering of the graft immediately in the proximal graft up to 30-40%.  SVG to the diagonal is patent. There is a 40-50% ostial SVG stenosis.  SVG to the first a second OM branches is widely patent.   LIMA to the LAD is widely patent.   Left ventriculography: Left ventricular systolic function is abnormal, there is mid inferior akinesis. LVEF is estimated at 50-55%, there is moderate mitral regurgitation   Final Conclusions:  1. Severe 3 vessel obstructive CAD 2. Patent LIMA to the LAD 3. Patent SVG to OM1 and OM2 4. Patent SVG to diagonal 5. Patent SVG to distal RCA 6. Good LV function.  Recommendations: There is no significant change from cardiac cath one year ago. Continue medical therapy. Anticipate DC in am. Will need to stay out of work for one week.  Reka Wist Martinique, [place] 12/19/2014, 3:32 PM  Radiology:  Dg Chest 2 View  08/09/2015  CLINICAL DATA:  Woke up with LEFT chest pain. History of cardiac bypass. EXAM: CHEST  2 VIEW COMPARISON:  Chest radiograph December 17, 2014 FINDINGS: The cardiac silhouette is normal in size. Status post median sternotomy for CABG. No pleural effusion or focal consolidation. No pneumothorax. Partially characterized ACDF. Soft tissue planes and included osseous structures are nonsuspicious. IMPRESSION: No acute cardiopulmonary  process, stable appearance of the chest from December 17, 2014. Electronically Signed   By: Elon Alas M.D.   On: 08/09/2015 03:46    ASSESSMENT AND PLAN:     1. Chest pain  - Patient has chronic chest pain. Repeat cath 12/2014 without new change. - Chest pain has improved on Imdur. However, intermittently still have CP with exertion. - point-of-care troponin negative. Trop x 1 negative.  EKG nonischemic, per admission note (EKG has not loaded in EPIC yet also not in his chart).  - Continue cycle enzyme. IM has ordered Echo. Will cancel echo.  - Continue ASA, statin, Effient, Imdur and metoprolol. - Will add amlodipine.  - ACE oh hold due to AKI - resume once resolved.    Otherwise per IM: Active Problems:   Diabetes ([place])   Essential hypertension   Hypokalemia   Dyslipidemia   Chronic kidney disease   Signed: Bhagat,Bhavinkumar, PA 08/09/2015, 8:45 AM Pager [phone]  Co-Sign MD Patient seen and examined and history reviewed. Agree with above findings and plan. [name] well known to me. He has a history of CAD and is s/p CABG and prior PCI. He has chronic angina even with cath in March of this year showing patent grafts. This am developed acute chest pain. Felt weak and lightheaded. Took Ntg and came to ED. He does note increased stress at home with wife who is increasingly debilitated and requiring a lot of care. Initial troponin negative. Ecg with chronic ST-T changes. Will repeat troponin today. If negative I would favor no further ischemic work up. I would recommend adding amlodipine 2.5 mg daily for angina and BP control. I don't think he would be able to afford Ranexa. Ambulate today. If  second troponin negative and he is doing OK then can DC later today.  Nyzaiah Kai Martinique, [place] 08/09/2015 9:21 AM

## 2015-08-09 NOTE — H&P (Signed)
[facility] History and Physical  Patient: Antonio [name]  MRN: [id number]  DOB: 1946-05-05  DOS: the patient was seen and examined on 08/09/2015 PCP: Myriam Jacobson, MD  Referring physician: Dr. Lita Mains Chief Complaint: Chest pain  HPI: Antonio [name] is a 69 y.o. male with Past medical history of coronary disease status post CABG, hypertension, diabetes mellitus, GERD, dyslipidemia. The patient is presenting with complaints of chest pain. The patient mentions the pain is located on the central and also radiating to his left arm as well as jaw. The patient mentions that he has been having on and off pain in the past with he has never had this severe chest pain and long time. He denies any nausea or vomiting or shortness of breath no diarrhea no constipation or leg swelling no recent change in his medication. He mentions he is compliant with all his medications. He is currently chest pain-free but complains of some headache.  The patient is coming from home.  At his baseline ambulates without support And is independent for most of his ADL; manages his medication on his own.  Review of Systems: as mentioned in the history of present illness.  A comprehensive review of the other systems is negative.  Past Medical History  Diagnosis Date  . Alcohol abuse, in remission   . HLD (hyperlipidemia)   . HTN (hypertension)   . CAD (coronary artery disease)     a. s/p CABG 1996  b. LHC in 11/2013 w/ patent grafts. c. 12/19/14 re-look cath with patent grafts and good LVF  . BPH (benign prostatic hyperplasia)   . Prostate cancer ([place])     a. s/p radiation   . Type II diabetes mellitus ([place])   . Bleeding per rectum     "related to prostate cancer and hemorrhoids" (09/03/2012)  . GERD (gastroesophageal reflux disease)   . History of lower GI bleeding   . Erectile dysfunction    Past Surgical History  Procedure Laterality Date  . Cardiac catheterization  01/27/2009    ef  60%  . Coronary artery bypass graft  1996    LIMA GRAFT TO THE LAD, SAPHENOUS VEIN GRAFT SEQUENTIALLY TO THE FIRST AND SECOND OBTUSE MARGINAL VESSELS, SAPHENOUS VEIN GRAFT TO THE DIAGONAL, AND SAPHENOUS VEIN GRAFT TO THE DISTAL RIGHT CORONARY   . Laceration repair  1980's    LEFT HAND  . Foot surgery  1980's    LEFT, "shot a nail gun thru it" (09/03/2012)  . Cervical disc surgery  ? 1990's    "went in on the side" (09/03/2012)  . Cardiac catheterization  08/25/2012    Severe 3v obstructive CAD, continued graft patency (SVG-D,  SVG-OM1-OM2, SVG-PDA, LIMA-LAD). area of diffuse dz in the distal LCx (up to 90%) unamenable to PCI  . Esophagogastroduodenoscopy Left 11/19/2013    Procedure: ESOPHAGOGASTRODUODENOSCOPY (EGD);  Surgeon: Arta Silence, MD;  Location: [facility] ENDOSCOPY;  Service: Endoscopy;  Laterality: Left;  . Left heart catheterization with coronary angiogram N/A 09/04/2012    Procedure: LEFT HEART CATHETERIZATION WITH CORONARY ANGIOGRAM;  Surgeon: Peter M Martinique, MD;  Location: [facility] CATH LAB;  Service: Cardiovascular;  Laterality: N/A;  . Left heart catheterization with coronary/graft angiogram N/A 11/22/2013    Procedure: LEFT HEART CATHETERIZATION WITH Beatrix Fetters;  Surgeon: Jettie Booze, MD;  Location: [facility] CATH LAB;  Service: Cardiovascular;  Laterality: N/A;  . Left heart catheterization with coronary/graft angiogram N/A 12/19/2014    Procedure: LEFT HEART CATHETERIZATION WITH CORONARY/GRAFT ANGIOGRAM;  Surgeon: Peter M Martinique, MD;  Location: [facility] CATH LAB;  Service: Cardiovascular;  Laterality: N/A;   Social History:  reports that he quit smoking about 12 years ago. His smoking use included Cigarettes. He has a 37 pack-year smoking history. He has never used smokeless tobacco. He reports that he does not drink alcohol or use illicit drugs.  Allergies  Allergen Reactions  . Demerol Other (See Comments)    hallucinations  . Meperidine And Related     Family History   Problem Relation Age of Onset  . Diabetes Other     2 siblings, 1 of whom is deceased  . Hypertension Other   . Cancer Other     Lung Cancer  . Cancer Other     FH of Prostate Cancer 1st degree relative  . Cancer Mother     breast  . Hypertension Mother   . Hypertension Father   . CAD Brother     Prior to Admission medications   Medication Sig Start Date End Date Taking? Authorizing Provider  aspirin EC 81 MG tablet Take 81 mg by mouth daily.    Historical Provider, MD  atorvastatin (LIPITOR) 40 MG tablet Take 1 tablet (40 mg total) by mouth daily. 12/26/14   Burtis Junes, NP  betamethasone dipropionate (DIPROLENE) 0.05 % cream Apply 0.05 % topically daily.  11/26/14   Historical Provider, MD  Insulin Glargine (LANTUS SOLOSTAR) 100 UNIT/ML Solostar Pen Inject 20 Units into the skin every morning.     Historical Provider, MD  Insulin Pen Needle (BD PEN NEEDLE NANO U/F) 32G X 4 MM MISC 1 pen by Does not apply route daily. 11/11/13   Kyra Leyland, PA-C  isosorbide mononitrate (IMDUR) 60 MG 24 hr tablet Take 1 tablet (60 mg total) by mouth daily. 03/10/15   Burtis Junes, NP  losartan-hydrochlorothiazide (HYZAAR) 100-25 MG per tablet Take 1 tablet by mouth daily.  11/19/14   Historical Provider, MD  metoprolol succinate (TOPROL-XL) 25 MG 24 hr tablet Take 1 tablet (25 mg total) by mouth daily. 12/20/14   Eileen Stanford, PA-C  Multiple Vitamin (MULTIVITAMIN WITH MINERALS) TABS Take 1 tablet by mouth daily.    Historical Provider, MD  nitroGLYCERIN (NITROSTAT) 0.4 MG SL tablet Place 1 tablet (0.4 mg total) under the tongue every 5 (five) minutes x 3 doses as needed for chest pain. 10/05/14   Peter M Martinique, MD  pantoprazole (PROTONIX) 40 MG tablet Take 1 tablet (40 mg total) by mouth 2 (two) times daily. 01/28/14   Biagio Borg, MD  prasugrel (EFFIENT) 10 MG TABS tablet Take 1 tablet (10 mg total) by mouth daily. 03/31/15   Peter M Martinique, MD  SitaGLIPtin-MetFORMIN HCl [id number] MG TB24  Take 1 tablet by mouth once. Patient taking differently: Take 1 tablet by mouth daily.  12/22/14   Eileen Stanford, PA-C  traMADol (ULTRAM) 50 MG tablet Take 1 tablet (50 mg total) by mouth every 6 (six) hours as needed for moderate pain. 03/28/15   Peter M Martinique, MD    Physical Exam: Filed Vitals:   08/09/15 0317 08/09/15 0330 08/09/15 0530 08/09/15 0612  BP: 139/94 135/85 121/88 145/81  Pulse: 64 64 63 65  Temp: 98.4 F (36.9 C)   98.1 F (36.7 C)  TempSrc: Oral   Oral  Resp: 15 20 14 16   Height: 5' 7.5" (1.715 m)   5\' 7"  (1.702 m)  Weight: 84.369 kg (186 lb)   80.604 kg (177  lb 11.2 oz)  SpO2: 98% 99% 96% 100%    General: Alert, Awake and Oriented to Time, Place and Person. Appear in mild distress Eyes: PERRL ENT: Oral Mucosa clear moist. Neck: no JVD Cardiovascular: S1 and S2 Present, no Murmur, Peripheral Pulses Present Respiratory: Bilateral Air entry equal and Decreased,  Clear to Auscultation, no Crackles, no wheezes Abdomen: Bowel Sound present, Soft and no tenderness Skin: no Rash Extremities: n Pedal edema, ono calf tenderness Neurologic: Grossly no focal neuro deficit. Labs on Admission:  CBC:  Recent Labs Lab 08/09/15 0320  WBC 6.2  HGB 12.9*  HCT 39.0  MCV 83.5  PLT 149*    CMP     Component Value Date/Time   NA 139 08/09/2015 0320   K 3.0* 08/09/2015 0320   CL 101 08/09/2015 0320   CO2 27 08/09/2015 0320   GLUCOSE 122* 08/09/2015 0320   BUN 19 08/09/2015 0320   CREATININE 1.40* 08/09/2015 0320   CALCIUM 9.4 08/09/2015 0320   PROT 7.4 02/03/2015 1525   ALBUMIN 3.8 02/03/2015 1525   AST 21 02/03/2015 1525   ALT 23 02/03/2015 1525   ALKPHOS 65 02/03/2015 1525   BILITOT 0.4 02/03/2015 1525   GFRNONAA 51* 08/09/2015 0320   GFRAA 59* 08/09/2015 0320    No results for input(s): CKTOTAL, CKMB, CKMBINDEX, TROPONINI in the last 168 hours. BNP (last 3 results)  Recent Labs  12/17/14 1007  BNP 196.7*    ProBNP (last 3 results) No results  for input(s): PROBNP in the last 8760 hours.   Radiological Exams on Admission: Dg Chest 2 View  08/09/2015  CLINICAL DATA:  Woke up with LEFT chest pain. History of cardiac bypass. EXAM: CHEST  2 VIEW COMPARISON:  Chest radiograph December 17, 2014 FINDINGS: The cardiac silhouette is normal in size. Status post median sternotomy for CABG. No pleural effusion or focal consolidation. No pneumothorax. Partially characterized ACDF. Soft tissue planes and included osseous structures are nonsuspicious. IMPRESSION: No acute cardiopulmonary process, stable appearance of the chest from December 17, 2014. Electronically Signed   By: Elon Alas M.D.   On: 08/09/2015 03:46   EKG: Independently reviewed. normal sinus rhythm, nonspecific ST and T waves changes.  Assessment/Plan 1. Chest pain The patient is presenting with numbness of chest pressure radiating to his left arm and shoulder. The patient is currently pain-free. EKG and troponins are negative. We'll continue monitoring and serial troponin. Patient remains nothing by mouth except medication. Next I would check echocardiogram as well. Cardiovascular be consulted. Continue with aspirin and prasugrel  2.  Essential hypertension Blood pressure at present stable to continue home medications.  3  Hypokalemia Replacing orally. Checking magnesium level.  4  Dyslipidemia Continue with home medication.  5  Chronic kidney disease Avoiding nephrotoxic medication at present. Monitor BMP daily.  6. Type 2 diabetes mellitus with chronic kidney disease. Checking hemoglobin A1c and placing the patient on sliding scale. Holding home medications.  Nutrition: Nothing by mouth except medications DVT Prophylaxis: subcutaneous Heparin  Advance goals of care discussion: Full code   Consults: Cardiology  Disposition: Admitted as observation, telemetry unit.  Author: Berle Mull, MD [facility] Pager: [phone] 08/09/2015  If  7PM-7AM, please contact night-coverage [email or web address] Password [email or web address]

## 2015-08-09 NOTE — Discharge Instructions (Signed)
Chest Wall Pain °Chest wall pain is pain in or around the bones and muscles of your chest. Sometimes, an injury causes this pain. Sometimes, the cause may not be known. This pain may take several weeks or longer to get better. °HOME CARE °Pay attention to any changes in your symptoms. Take these actions to help with your pain: °· Rest as told by your doctor. °· Avoid activities that cause pain. Try not to use your chest, belly (abdominal), or side muscles to lift heavy things. °· If directed, apply ice to the painful area: °¨ Put ice in a plastic bag. °¨ Place a towel between your skin and the bag. °¨ Leave the ice on for 20 minutes, 2-3 times per day. °· Take over-the-counter and prescription medicines only as told by your doctor. °· Do not use tobacco products, including cigarettes, chewing tobacco, and e-cigarettes. If you need help quitting, ask your doctor. °· Keep all follow-up visits as told by your doctor. This is important. °GET HELP IF: °· You have a fever. °· Your chest pain gets worse. °· You have new symptoms. °GET HELP RIGHT AWAY IF: °· You feel sick to your stomach (nauseous) or you throw up (vomit). °· You feel sweaty or light-headed. °· You have a cough with phlegm (sputum) or you cough up blood. °· You are short of breath. °  °This information is not intended to replace advice given to you by your health care provider. Make sure you discuss any questions you have with your health care provider. °  °Document Released: 03/18/2008 Document Revised: 06/21/2015 Document Reviewed: 12/26/2014 °Elsevier Interactive Patient Education ©2016 [employer] ° °

## 2015-08-09 NOTE — ED Notes (Signed)
Pt arrives from home with chest pain starting 30 minutes ago, states he was up going to the bathroom when he noticed the pain. Took one nitro and came to ED. States pain feels a bit better than it did initially, however still rates it 8/10. Last aspirin yesterday morning.
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
## 2015-08-09 NOTE — Discharge Summary (Signed)
Physician Discharge Summary  Antonio [name]:[id number] DOB: 1946-06-03 DOA: 08/09/2015  PCP: Myriam Jacobson, MD  Admit date: 08/09/2015 Discharge date: 08/09/2015  Time spent: > 30 minutes  Recommendations for Outpatient Follow-up:  1. Patient to f/u with cardiologist after d/c 2. Started on amlodipine to help with angina type discomfort. Monitor blood pressures  Discharge Diagnoses:  Principal Problem:   Chest pain Active Problems:   Diabetes [facility])   Essential hypertension   Hypokalemia   Dyslipidemia   Chronic kidney disease   Discharge Condition: stable  Diet recommendation: Heart healthy  Filed Weights   08/09/15 0317 08/09/15 0612  Weight: 84.369 kg (186 lb) 80.604 kg (177 lb 11.2 oz)    History of present illness:  Patient is a 69 year old with past medical history coronary artery disease status post CABG, hypertension, diabetes mellitus, GERD, dyslipidemia who presented to the hospital complaining of chest pain.  Hospital Course:  Chest pain: -Etiology consulted and recommended the following: Patient seen and examined and history reviewed. Agree with above findings and plan. [name] well known to me. He has a history of CAD and is s/p CABG and prior PCI. He has chronic angina even with cath in March of this year showing patent grafts. This am developed acute chest pain. Felt weak and lightheaded. Took Ntg and came to ED. He does note increased stress at home with wife who is increasingly debilitated and requiring a lot of care. Initial troponin negative. Ecg with chronic ST-T changes. Will repeat troponin today. If negative I would favor no further ischemic work up. I would recommend adding amlodipine 2.5 mg daily for angina and BP control. I don't think he would be able to afford Ranexa. Ambulate today. If second troponin negative and he is doing OK then can DC later today.  Troponins 2) negative Will provide prescription for amlodipine on  discharge  Procedures:  None  Consultations:  Cardiology  Discharge Exam: Filed Vitals:   08/09/15 1355  BP: 105/75  Pulse: 61  Temp: 98.4 F (36.9 C)  Resp: 18    General: Pt in nad, alert and awake Cardiovascular: rrr, no mrg Respiratory: cta bl, no wheezes  Discharge Instructions   Discharge Instructions    Call MD for:  difficulty breathing, headache or visual disturbances    Complete by:  As directed      Call MD for:  severe uncontrolled pain    Complete by:  As directed      Call MD for:  temperature >100.4    Complete by:  As directed      Diet - low sodium heart healthy    Complete by:  As directed      Discharge instructions    Complete by:  As directed   Please f/u with your cardiologist in 1-2 weeks or sooner should any new concerns arise.     Increase activity slowly    Complete by:  As directed           Current Discharge Medication List    START taking these medications   Details  amLODipine (NORVASC) 2.5 MG tablet Take 1 tablet (2.5 mg total) by mouth daily. Qty: 30 tablet, Refills: 0      CONTINUE these medications which have NOT CHANGED   Details  aspirin EC 81 MG tablet Take 81 mg by mouth daily.    atorvastatin (LIPITOR) 40 MG tablet Take 1 tablet (40 mg total) by mouth daily. Qty: 90 tablet, Refills:  3    betamethasone dipropionate (DIPROLENE) 0.05 % cream Apply 0.05 % topically daily.  Refills: 0    Insulin Glargine (LANTUS SOLOSTAR) 100 UNIT/ML Solostar Pen Inject 20 Units into the skin every morning.     Insulin Pen Needle (BD PEN NEEDLE NANO U/F) 32G X 4 MM MISC 1 pen by Does not apply route daily. Qty: 30 each, Refills: 11    isosorbide mononitrate (IMDUR) 60 MG 24 hr tablet Take 1 tablet (60 mg total) by mouth daily. Qty: 90 tablet, Refills: 3    losartan-hydrochlorothiazide (HYZAAR) 100-25 MG per tablet Take 1 tablet by mouth daily.  Refills: 0    metoprolol succinate (TOPROL-XL) 25 MG 24 hr tablet Take 1 tablet (25  mg total) by mouth daily. Qty: 30 tablet, Refills: 11    Multiple Vitamin (MULTIVITAMIN WITH MINERALS) TABS Take 1 tablet by mouth daily.    nitroGLYCERIN (NITROSTAT) 0.4 MG SL tablet Place 1 tablet (0.4 mg total) under the tongue every 5 (five) minutes x 3 doses as needed for chest pain. Qty: 25 tablet, Refills: 3    pantoprazole (PROTONIX) 40 MG tablet Take 1 tablet (40 mg total) by mouth 2 (two) times daily. Qty: 60 tablet, Refills: 11    prasugrel (EFFIENT) 10 MG TABS tablet Take 1 tablet (10 mg total) by mouth daily. Qty: 30 tablet, Refills: 5    SitaGLIPtin-MetFORMIN HCl [phone] MG TB24 Take 1 tablet by mouth once. Qty: 30 tablet, Refills: 11    traMADol (ULTRAM) 50 MG tablet Take 1 tablet (50 mg total) by mouth every 6 (six) hours as needed for moderate pain. Qty: 60 tablet, Refills: 3       Allergies  Allergen Reactions  . Demerol Other (See Comments)    hallucinations  . Meperidine And Related       The results of significant diagnostics from this hospitalization (including imaging, microbiology, ancillary and laboratory) are listed below for reference.    Significant Diagnostic Studies: Dg Chest 2 View  08/09/2015  CLINICAL DATA:  Woke up with LEFT chest pain. History of cardiac bypass. EXAM: CHEST  2 VIEW COMPARISON:  Chest radiograph December 17, 2014 FINDINGS: The cardiac silhouette is normal in size. Status post median sternotomy for CABG. No pleural effusion or focal consolidation. No pneumothorax. Partially characterized ACDF. Soft tissue planes and included osseous structures are nonsuspicious. IMPRESSION: No acute cardiopulmonary process, stable appearance of the chest from December 17, 2014. Electronically Signed   By: Elon Alas M.D.   On: 08/09/2015 03:46    Microbiology: No results found for this or any previous visit (from the past 240 hour(s)).   Labs: Basic Metabolic Panel:  Recent Labs Lab 08/09/15 0320 08/09/15 0733  NA 139  --   K 3.0*  --    CL 101  --   CO2 27  --   GLUCOSE 122*  --   BUN 19  --   CREATININE 1.40*  --   CALCIUM 9.4  --   MG  --  1.8   Liver Function Tests: No results for input(s): AST, ALT, ALKPHOS, BILITOT, PROT, ALBUMIN in the last 168 hours. No results for input(s): LIPASE, AMYLASE in the last 168 hours. No results for input(s): AMMONIA in the last 168 hours. CBC:  Recent Labs Lab 08/09/15 0320  WBC 6.2  HGB 12.9*  HCT 39.0  MCV 83.5  PLT 149*   Cardiac Enzymes:  Recent Labs Lab 08/09/15 0733 08/09/15 1301  TROPONINI <0.03 <0.03  BNP: BNP (last 3 results)  Recent Labs  12/17/14 1007  BNP 196.7*    ProBNP (last 3 results) No results for input(s): PROBNP in the last 8760 hours.  CBG:  Recent Labs Lab 08/09/15 0808 08/09/15 1122  GLUCAP 127* 85    Signed:  Velvet Bathe  [facility] 08/09/2015, 4:21 PM

## 2015-08-09 NOTE — ED Notes (Signed)
Patient transported to X-ray 

## 2015-08-10 LAB — HEMOGLOBIN A1C
HEMOGLOBIN A1C: 7.2 % — AB (ref 4.8–5.6)
MEAN PLASMA GLUCOSE: 160 mg/dL

## 2015-09-12 NOTE — ED Provider Notes (Signed)
Physical exam for 08/09/15:   Physical Exam  Constitutional: He is oriented to person, place, and time and well-developed, well-nourished, and in no distress. No distress.  HENT:  Head: Normocephalic and atraumatic.  Mouth/Throat: No oropharyngeal exudate.  Eyes: EOM are normal. Pupils are equal, round, and reactive to light.  Neck: Normal range of motion. Neck supple. No JVD present.  Cardiovascular: Normal rate and regular rhythm.  Exam reveals no gallop and no friction rub.   No murmur heard. Pulmonary/Chest: Effort normal and breath sounds normal. No respiratory distress. He has no wheezes. He has no rales. He exhibits no tenderness.  Abdominal: Soft. Bowel sounds are normal. He exhibits no distension and no mass. There is no tenderness. There is no rebound and no guarding.  Musculoskeletal: Normal range of motion. He exhibits no edema or tenderness.  No lower extremity swelling or pain. Distal pulses intact and equal.  Neurological: He is alert and oriented to person, place, and time.  Moves all extremities without deficit. Sensation fully intact.  Skin: Skin is warm and dry. No rash noted. He is not diaphoretic. No erythema. No pallor.  Psychiatric: Affect and judgment normal.    Julianne Rice, MD 09/12/15 1353

## 2015-10-31 ENCOUNTER — Ambulatory Visit: Payer: Medicare Other | Admitting: Cardiology

## 2015-12-14 ENCOUNTER — Other Ambulatory Visit: Payer: Self-pay | Admitting: *Deleted

## 2015-12-14 MED ORDER — ATORVASTATIN CALCIUM 40 MG PO TABS: 40.0000 mg | ORAL_TABLET | Freq: Every day | ORAL | Status: DC

## 2015-12-21 ENCOUNTER — Emergency Department (HOSPITAL_COMMUNITY)
Admission: EM | Admit: 2015-12-21 | Discharge: 2015-12-21 | Disposition: A | Discharge status: Home / Self Care | Payer: Self-pay

## 2015-12-21 NOTE — ED Notes (Addendum)
Patient called 3X with no response

## 2016-01-24 ENCOUNTER — Telehealth: Payer: Self-pay | Admitting: Cardiology

## 2016-01-24 ENCOUNTER — Encounter: Payer: Self-pay | Admitting: *Deleted

## 2016-01-24 NOTE — Telephone Encounter (Signed)
Letter at front desk for patient pick up - please inform when he calls back.

## 2016-01-24 NOTE — Telephone Encounter (Signed)
No answer. Left message to call back.   

## 2016-01-24 NOTE — Telephone Encounter (Signed)
Letter at front desk for patient to pick up. 

## 2016-01-24 NOTE — Telephone Encounter (Signed)
He is cleared to use gym. No specific restrictions.  Cherith Tewell Martinique MD, [facility]

## 2016-01-24 NOTE — Telephone Encounter (Signed)
Pt requesting clearance for exercise from Dr. Martinique, w/ any specified restrictions.

## 2016-01-24 NOTE — Telephone Encounter (Signed)
Pt calling to get a letter to [phone] at his gym stating limitations for exercising--pls call when ready at [phone]

## 2016-01-24 NOTE — Telephone Encounter (Signed)
New message    Pt joined the gym but he has to get clearance from [place]

## 2016-01-26 NOTE — Telephone Encounter (Signed)
Patient informed and said that he just picked the letter up.

## 2016-02-19 ENCOUNTER — Encounter: Payer: Self-pay | Admitting: Cardiology

## 2016-02-19 ENCOUNTER — Ambulatory Visit (INDEPENDENT_AMBULATORY_CARE_PROVIDER_SITE_OTHER): Payer: Self-pay | Admitting: Cardiology

## 2016-02-19 VITALS — BP 100/64 | HR 66 | Ht 67.5 in | Wt 178.2 lb

## 2016-02-19 DIAGNOSIS — Z794 Long term (current) use of insulin: Secondary | ICD-10-CM

## 2016-02-19 DIAGNOSIS — I10 Essential (primary) hypertension: Secondary | ICD-10-CM

## 2016-02-19 DIAGNOSIS — E1122 Type 2 diabetes mellitus with diabetic chronic kidney disease: Secondary | ICD-10-CM

## 2016-02-19 DIAGNOSIS — N182 Chronic kidney disease, stage 2 (mild): Secondary | ICD-10-CM

## 2016-02-19 DIAGNOSIS — I25810 Atherosclerosis of coronary artery bypass graft(s) without angina pectoris: Secondary | ICD-10-CM

## 2016-02-19 NOTE — Progress Notes (Signed)
CARDIOLOGY OFFICE NOTE  Date:  02/19/2016    Antonio [name] Date of Birth: 1946-06-27 Medical Record [id number]  PCP:  Myriam Jacobson, MD  Cardiologist:  Martin[place]    Chief Complaint  Patient presents with  . Follow-up    no chest pain, no shortness of breath, no edema, no pain or cramping in legs, no lightheaded or dizziness     History of Present Illness: Antonio [name] is a 70 y.o. male who is seen for follow up CAD. He has a history of DM, HTN, HLD, CAD s/p CABG (1996) and hx of GI bleed.  Prior catheterization in February 2015 showed an occluded LAD, severe disease in the obtuse marginal and an occluded RCA. The LIMA to the LAD was patent, saphenous vein graft to the diagonal patent, jump graft to the first and second marginals patent and patient saphenous vein graft to the PDA patent. Patient's ejection fraction was 55%. Echocardiogram in February 2015 showed normal LV function, grade 1 diastolic dysfunction, mild left atrial enlargement and mild mitral regurgitation.   He was admitted to [facility] on 12/17/14 with unstable angina. Cardiac cath was unchanged. On follow up he continued to have chest pain with exertion. He was started on Imdur with resolution of these symptoms.   On follow up today he is doing well. Still facing the stress of caring for his wife following a closed head injury. He denies any significant chest pain or dyspnea. No palpitations.    Past Medical History  Diagnosis Date  . Alcohol abuse, in remission   . HLD (hyperlipidemia)   . HTN (hypertension)   . CAD (coronary artery disease)     a. s/p CABG 1996  b. LHC in 11/2013 w/ patent grafts. c. 12/19/14 re-look cath with patent grafts and good LVF  . BPH (benign prostatic hyperplasia)   . Prostate cancer ([place])     a. s/p radiation   . Type II diabetes mellitus ([place])   . Bleeding per rectum     "related to prostate cancer and hemorrhoids" (09/03/2012)  . GERD (gastroesophageal reflux disease)     . History of lower GI bleeding   . Erectile dysfunction     Past Surgical History  Procedure Laterality Date  . Cardiac catheterization  01/27/2009    ef 60%  . Coronary artery bypass graft  1996    LIMA GRAFT TO THE LAD, SAPHENOUS VEIN GRAFT SEQUENTIALLY TO THE FIRST AND SECOND OBTUSE MARGINAL VESSELS, SAPHENOUS VEIN GRAFT TO THE DIAGONAL, AND SAPHENOUS VEIN GRAFT TO THE DISTAL RIGHT CORONARY   . Laceration repair  1980's    LEFT HAND  . Foot surgery  1980's    LEFT, "shot a nail gun thru it" (09/03/2012)  . Cervical disc surgery  ? 1990's    "went in on the side" (09/03/2012)  . Cardiac catheterization  08/25/2012    Severe 3v obstructive CAD, continued graft patency (SVG-D,  SVG-OM1-OM2, SVG-PDA, LIMA-LAD). area of diffuse dz in the distal LCx (up to 90%) unamenable to PCI  . Esophagogastroduodenoscopy Left 11/19/2013    Procedure: ESOPHAGOGASTRODUODENOSCOPY (EGD);  Surgeon: Arta Silence, MD;  Location[facility] ENDOSCOPY;  Service: Endoscopy;  Laterality: Left;  . Left heart catheterization with coronary angiogram N/A 09/04/2012    Procedure: LEFT HEART CATHETERIZATION WITH CORONARY ANGIOGRAM;  Surgeon: Peter M Martinique, MD;  Location: [facility] CATH LAB;  Service: Cardiovascular;  Laterality: N/A;  . Left heart catheterization with coronary/graft angiogram N/A 11/22/2013  Procedure: LEFT HEART CATHETERIZATION WITH Beatrix Fetters;  Surgeon: Jettie Booze, MD;  Location[facility] CATH LAB;  Service: Cardiovascular;  Laterality: N/A;  . Left heart catheterization with coronary/graft angiogram N/A 12/19/2014    Procedure: LEFT HEART CATHETERIZATION WITH Beatrix Fetters;  Surgeon: Peter M Martinique, MD;  Location: [facility] CATH LAB;  Service: Cardiovascular;  Laterality: N/A;     Medications: Current Outpatient Prescriptions  Medication Sig Dispense Refill  . amLODipine (NORVASC) 2.5 MG tablet Take 1 tablet (2.5 mg total) by mouth daily. 30 tablet 0  . aspirin EC 81 MG tablet Take 81 mg by  mouth daily.    Marland Kitchen atorvastatin (LIPITOR) 40 MG tablet Take 1 tablet (40 mg total) by mouth daily. 90 tablet 0  . betamethasone dipropionate (DIPROLENE) 0.05 % cream Apply 0.05 % topically daily.   0  . fluocinonide ointment (LIDEX) 0.05 % APPLY TO AFFECTED AREAS DAILY AS NEEDED  0  . Insulin Glargine (LANTUS SOLOSTAR) 100 UNIT/ML Solostar Pen Inject 20 Units into the skin every morning.     . Insulin Pen Needle (BD PEN NEEDLE NANO U/F) 32G X 4 MM MISC 1 pen by Does not apply route daily. 30 each 11  . isosorbide mononitrate (IMDUR) 60 MG 24 hr tablet Take 1 tablet (60 mg total) by mouth daily. 90 tablet 3  . LEVEMIR FLEXTOUCH 100 UNIT/ML Pen INJECT 20 UNITS SUBCUTANEOUSLY AS DIRECTED  0  . losartan-hydrochlorothiazide (HYZAAR) 100-25 MG per tablet Take 1 tablet by mouth daily.   0  . metoprolol tartrate (LOPRESSOR) 25 MG tablet Take 1 tablet by mouth 2 (two) times daily.  0  . Multiple Vitamin (MULTIVITAMIN WITH MINERALS) TABS Take 1 tablet by mouth daily.    . nitroGLYCERIN (NITROSTAT) 0.4 MG SL tablet Place 1 tablet (0.4 mg total) under the tongue every 5 (five) minutes x 3 doses as needed for chest pain. 25 tablet 3  . pantoprazole (PROTONIX) 40 MG tablet Take 1 tablet (40 mg total) by mouth 2 (two) times daily. 60 tablet 11  . prasugrel (EFFIENT) 10 MG TABS tablet Take 1 tablet (10 mg total) by mouth daily. 30 tablet 5  . SitaGLIPtin-MetFORMIN HCl 914 167 2825 MG TB24 Take 1 tablet by mouth once. (Patient taking differently: Take 1 tablet by mouth daily. ) 30 tablet 11  . traMADol (ULTRAM) 50 MG tablet Take 1 tablet (50 mg total) by mouth every 6 (six) hours as needed for moderate pain. 60 tablet 3   No current facility-administered medications for this visit.    Allergies: Allergies  Allergen Reactions  . Demerol Other (See Comments)    hallucinations  . Meperidine And Related     Social History: The patient  reports that he quit smoking about 13 years ago. His smoking use included  Cigarettes. He has a 37 pack-year smoking history. He has never used smokeless tobacco. He reports that he does not drink alcohol or use illicit drugs.   Family History: The patient's family history includes CAD in his brother; Cancer in his mother, other, and other; Diabetes in his other; Hypertension in his father, mother, and other.   Review of Systems: Please see the history of present illness.   Otherwise, the review of systems is positive for none.   All other systems are reviewed and negative.   Physical Exam: VS:  BP 100/64 mmHg  Pulse 66  Ht 5' 7.5" (1.715 m)  Wt 80.854 kg (178 lb 4 oz)  BMI 27.49 kg/m2 .  BMI  Body mass index is 27.49 kg/(m^2).  Wt Readings from Last 3 Encounters:  02/19/16 80.854 kg (178 lb 4 oz)  08/09/15 80.604 kg (177 lb 11.2 oz)  03/28/15 84.369 kg (186 lb)    General: Pleasant. Well developed, well nourished and in no acute distress.  HEENT: Normal. Neck: Supple, no JVD, carotid bruits, or masses noted.  Cardiac: Regular rate and rhythm. No murmurs, rubs, or gallops. No edema.  Respiratory:  Lungs are clear to auscultation bilaterally with normal work of breathing.  GI: Soft and nontender.  MS: No deformity or atrophy. Gait and ROM intact. Skin: Warm and dry. Color is normal.  Neuro:  Strength and sensation are intact and no gross focal deficits noted.  Psych: Alert, appropriate and with normal affect.   LABORATORY DATA:  EKG:  EKG is not ordered today.  Lab Results  Component Value Date   WBC 6.2 08/09/2015   HGB 12.9* 08/09/2015   HCT 39.0 08/09/2015   PLT 149* 08/09/2015   GLUCOSE 122* 08/09/2015   CHOL 112 02/03/2015   TRIG 125.0 02/03/2015   HDL 30.70* 02/03/2015   LDLCALC 56 02/03/2015   ALT 23 02/03/2015   AST 21 02/03/2015   NA 139 08/09/2015   K 3.0* 08/09/2015   CL 101 08/09/2015   CREATININE 1.40* 08/09/2015   BUN 19 08/09/2015   CO2 27 08/09/2015   TSH 0.932 12/17/2014   PSA 0.96 05/27/2014   INR 1.04 12/19/2014    HGBA1C 7.2* 08/09/2015   MICROALBUR 2.1* 12/14/2013    BNP (last 3 results) No results for input(s): BNP in the last 8760 hours.  ProBNP (last 3 results) No results for input(s): PROBNP in the last 8760 hours.   Other Studies Reviewed Today: none  Assessment/Plan: 1. Chest pain - s/p cardiac cath in March 2016 without new change. He has improved with  Imdur. Asymptomatic now. Continue medical management.   2. DM - elevated A1C - followed by Dr. Mancel Bale.  3. HLD - now on Lipitor. Labs with primary MD  4. HTN - BP ok on current regimen.   Current medicines are reviewed with the patient today.  The patient does not have concerns regarding medicines other than what has been noted above.  The following changes have been made:  See above.  Labs/ tests ordered today include:   No orders of the defined types were placed in this encounter.     Disposition:   FU with me in 6 months.  Patient is agreeable to this plan and will call if any problems develop in the interim.   Signed: Peter Martinique MD, [facility]    02/19/2016 5:57 PM

## 2016-02-19 NOTE — Patient Instructions (Signed)
Continue your current therapy  I will see you in 6 months.   

## 2016-03-20 ENCOUNTER — Other Ambulatory Visit: Payer: Self-pay | Admitting: *Deleted

## 2016-03-20 MED ORDER — ISOSORBIDE MONONITRATE ER 60 MG PO TB24: 60.0000 mg | ORAL_TABLET | Freq: Every day | ORAL | Status: DC

## 2016-05-31 ENCOUNTER — Encounter (HOSPITAL_COMMUNITY): Payer: Self-pay | Admitting: Emergency Medicine

## 2016-05-31 ENCOUNTER — Encounter (HOSPITAL_COMMUNITY): Payer: Self-pay | Admitting: *Deleted

## 2016-05-31 ENCOUNTER — Emergency Department (HOSPITAL_COMMUNITY)
Admission: EM | Admit: 2016-05-31 | Discharge: 2016-05-31 | Disposition: A | Discharge status: Home / Self Care | Payer: Medicare HMO | Attending: Emergency Medicine | Admitting: Emergency Medicine

## 2016-05-31 ENCOUNTER — Emergency Department (HOSPITAL_COMMUNITY)
Admission: EM | Admit: 2016-05-31 | Discharge: 2016-06-01 | Disposition: A | Discharge status: Home / Self Care | Payer: Medicare HMO | Attending: Emergency Medicine | Admitting: Emergency Medicine

## 2016-05-31 DIAGNOSIS — Z8546 Personal history of malignant neoplasm of prostate: Secondary | ICD-10-CM | POA: Diagnosis not present

## 2016-05-31 DIAGNOSIS — Z79899 Other long term (current) drug therapy: Secondary | ICD-10-CM | POA: Insufficient documentation

## 2016-05-31 DIAGNOSIS — Z87891 Personal history of nicotine dependence: Secondary | ICD-10-CM | POA: Insufficient documentation

## 2016-05-31 DIAGNOSIS — N189 Chronic kidney disease, unspecified: Secondary | ICD-10-CM | POA: Insufficient documentation

## 2016-05-31 DIAGNOSIS — R319 Hematuria, unspecified: Secondary | ICD-10-CM | POA: Insufficient documentation

## 2016-05-31 DIAGNOSIS — E1122 Type 2 diabetes mellitus with diabetic chronic kidney disease: Secondary | ICD-10-CM | POA: Insufficient documentation

## 2016-05-31 DIAGNOSIS — E876 Hypokalemia: Secondary | ICD-10-CM | POA: Diagnosis not present

## 2016-05-31 DIAGNOSIS — Z791 Long term (current) use of non-steroidal anti-inflammatories (NSAID): Secondary | ICD-10-CM | POA: Insufficient documentation

## 2016-05-31 DIAGNOSIS — I10 Essential (primary) hypertension: Secondary | ICD-10-CM | POA: Insufficient documentation

## 2016-05-31 DIAGNOSIS — Z794 Long term (current) use of insulin: Secondary | ICD-10-CM | POA: Insufficient documentation

## 2016-05-31 DIAGNOSIS — Z951 Presence of aortocoronary bypass graft: Secondary | ICD-10-CM | POA: Insufficient documentation

## 2016-05-31 DIAGNOSIS — Z7982 Long term (current) use of aspirin: Secondary | ICD-10-CM | POA: Insufficient documentation

## 2016-05-31 DIAGNOSIS — I129 Hypertensive chronic kidney disease with stage 1 through stage 4 chronic kidney disease, or unspecified chronic kidney disease: Secondary | ICD-10-CM | POA: Diagnosis not present

## 2016-05-31 DIAGNOSIS — Z5321 Procedure and treatment not carried out due to patient leaving prior to being seen by health care provider: Secondary | ICD-10-CM | POA: Diagnosis not present

## 2016-05-31 DIAGNOSIS — I2510 Atherosclerotic heart disease of native coronary artery without angina pectoris: Secondary | ICD-10-CM | POA: Diagnosis not present

## 2016-05-31 DIAGNOSIS — Z7984 Long term (current) use of oral hypoglycemic drugs: Secondary | ICD-10-CM | POA: Insufficient documentation

## 2016-05-31 DIAGNOSIS — N390 Urinary tract infection, site not specified: Secondary | ICD-10-CM | POA: Insufficient documentation

## 2016-05-31 DIAGNOSIS — E119 Type 2 diabetes mellitus without complications: Secondary | ICD-10-CM | POA: Insufficient documentation

## 2016-05-31 DIAGNOSIS — Z955 Presence of coronary angioplasty implant and graft: Secondary | ICD-10-CM | POA: Diagnosis not present

## 2016-05-31 LAB — CBC
HEMATOCRIT: 39.3 % (ref 39.0–52.0)
HEMOGLOBIN: 12.9 g/dL — AB (ref 13.0–17.0)
MCH: 27.7 pg (ref 26.0–34.0)
MCHC: 32.8 g/dL (ref 30.0–36.0)
MCV: 84.5 fL (ref 78.0–100.0)
PLATELETS: 181 10*3/uL (ref 150–400)
RBC: 4.65 MIL/uL (ref 4.22–5.81)
RDW: 15.7 % — ABNORMAL HIGH (ref 11.5–15.5)
WBC: 6.9 10*3/uL (ref 4.0–10.5)

## 2016-05-31 LAB — CBC WITH DIFFERENTIAL/PLATELET
Basophils Absolute: 0 10*3/uL (ref 0.0–0.1)
Basophils Relative: 0 %
EOS PCT: 2 %
Eosinophils Absolute: 0.1 10*3/uL (ref 0.0–0.7)
HEMATOCRIT: 36.7 % — AB (ref 39.0–52.0)
Hemoglobin: 12.5 g/dL — ABNORMAL LOW (ref 13.0–17.0)
LYMPHS ABS: 1.4 10*3/uL (ref 0.7–4.0)
Lymphocytes Relative: 26 %
MCH: 28.2 pg (ref 26.0–34.0)
MCHC: 34.1 g/dL (ref 30.0–36.0)
MCV: 82.7 fL (ref 78.0–100.0)
MONO ABS: 0.4 10*3/uL (ref 0.1–1.0)
MONOS PCT: 7 %
NEUTROS ABS: 3.5 10*3/uL (ref 1.7–7.7)
Neutrophils Relative %: 65 %
PLATELETS: 160 10*3/uL (ref 150–400)
RBC: 4.44 MIL/uL (ref 4.22–5.81)
RDW: 15.7 % — AB (ref 11.5–15.5)
WBC: 5.4 10*3/uL (ref 4.0–10.5)

## 2016-05-31 LAB — BASIC METABOLIC PANEL
Anion gap: 8 (ref 5–15)
Anion gap: 8 (ref 5–15)
BUN: 14 mg/dL (ref 6–20)
BUN: 16 mg/dL (ref 6–20)
CHLORIDE: 105 mmol/L (ref 101–111)
CO2: 26 mmol/L (ref 22–32)
CO2: 24 mmol/L (ref 22–32)
Calcium: 9.3 mg/dL (ref 8.9–10.3)
Calcium: 9.1 mg/dL (ref 8.9–10.3)
Chloride: 106 mmol/L (ref 101–111)
Creatinine, Ser: 1.41 mg/dL — ABNORMAL HIGH (ref 0.61–1.24)
Creatinine, Ser: 1.35 mg/dL — ABNORMAL HIGH (ref 0.61–1.24)
GFR calc Af Amer: >60 mL/min (ref >60)
GFR calc non Af Amer: 50 mL/min — ABNORMAL LOW (ref >60)
GFR, EST AFRICAN AMERICAN: 58 mL/min — AB (ref >60)
GFR, EST NON AFRICAN AMERICAN: 53 mL/min — AB (ref >60)
GLUCOSE: 153 mg/dL — AB (ref 65–99)
Glucose, Bld: 119 mg/dL — ABNORMAL HIGH (ref 65–99)
POTASSIUM: 2.9 mmol/L — AB (ref 3.5–5.1)
POTASSIUM: 2.7 mmol/L — AB (ref 3.5–5.1)
SODIUM: 139 mmol/L (ref 135–145)
Sodium: 138 mmol/L (ref 135–145)

## 2016-05-31 LAB — URINALYSIS, ROUTINE W REFLEX MICROSCOPIC
Bilirubin Urine: NEGATIVE
GLUCOSE, UA: NEGATIVE mg/dL
Ketones, ur: NEGATIVE mg/dL
Nitrite: NEGATIVE
PH: 6 (ref 5.0–8.0)
Specific Gravity, Urine: 1.02 (ref 1.005–1.030)

## 2016-05-31 LAB — URINE MICROSCOPIC-ADD ON: Squamous Epithelial / LPF: NONE SEEN

## 2016-05-31 MED ORDER — POTASSIUM CHLORIDE CRYS ER 20 MEQ PO TBCR: 20.0000 meq | EXTENDED_RELEASE_TABLET | Freq: Two times a day (BID) | ORAL | 0 refills | Status: DC

## 2016-05-31 MED ORDER — LIDOCAINE HCL 2 % EX GEL
1.0000 "application " | Freq: Once | CUTANEOUS | Status: AC | PRN
  Administered 2016-05-31: 1 via URETHRAL
  Filled 2016-05-31: qty 11

## 2016-05-31 MED ORDER — CEPHALEXIN 500 MG PO CAPS: 500.0000 mg | ORAL_CAPSULE | Freq: Two times a day (BID) | ORAL | 0 refills | Status: DC

## 2016-05-31 MED ORDER — POTASSIUM CHLORIDE CRYS ER 20 MEQ PO TBCR
40.0000 meq | EXTENDED_RELEASE_TABLET | Freq: Once | ORAL | Status: AC
  Administered 2016-05-31: 40 meq via ORAL
  Filled 2016-05-31: qty 2

## 2016-05-31 NOTE — ED Triage Notes (Addendum)
Pt from home with complaints of urinary retention and hematuria that began today. Pt states that his last urination was around 0900 this morning. Pt rates his pain at 10/10 when he is trying to urinate. Pt states he had to be cauterized for a bleed in his bladder before. Pt also has a hx of prostate cancer, but is in remission. This has happened about 3 years ago when pt's doctor said that it would resolve on its own, and that time it did

## 2016-05-31 NOTE — ED Notes (Signed)
The pt states he is going to leave and go to an urgent care near his house, he does not want to wait any longer. Pt encouraged to stay for medical examination and explained triage process, but he states he is leaving

## 2016-05-31 NOTE — ED Notes (Signed)
Pt urinated 200 ml of bright red blood and pt stated that his pain went away. Following this, pt had 172ml of retained urine.

## 2016-05-31 NOTE — ED Triage Notes (Signed)
Pt states blood clots in urine and difficulty urinating since 5 am.  Hx of prostate CA and pt states he used a pump last night to have intercourse and he might have put the ring on too tight.

## 2016-05-31 NOTE — ED Notes (Signed)
Critical Potassium 2.7 Read back and verified with Jay, in lab  Primary RN notified 

## 2016-05-31 NOTE — ED Provider Notes (Addendum)
[facility] DEPT Provider Note   CSN: [id number] Arrival date & time: 05/31/16  1941     History   Chief Complaint Chief Complaint  Patient presents with  . Urinary Retention  . Hematuria    HPI Antonio [name] is a 70 y.o. male presenting with urinary retention and hematuria. History of prostate cancer several years ago. Patient is currently on Effient. This morning around 4:30 or 5 in the morning he noticed blood when urinating. This had intermittent clots. Since around 9 this morning he has been unable to even besides very small amounts. When he came here he was able to urinate about 200 mL's and feels like the lower abdominal pressure is now gone. He has pain when trying to urinate. No testicular pain. No fevers or back pain. States he has had to be "cauterized" in the past by his urologist for similar bleeding.  HPI  Past Medical History:  Diagnosis Date  . Alcohol abuse, in remission   . Bleeding per rectum    "related to prostate cancer and hemorrhoids" (09/03/2012)  . BPH (benign prostatic hyperplasia)   . CAD (coronary artery disease)    a. s/p CABG 1996  b. LHC in 11/2013 w/ patent grafts. c. 12/19/14 re-look cath with patent grafts and good LVF  . Erectile dysfunction   . GERD (gastroesophageal reflux disease)   . History of lower GI bleeding   . HLD (hyperlipidemia)   . HTN (hypertension)   . Prostate cancer ([place])    a. s/p radiation   . Type II diabetes mellitus [facility])     Patient Active Problem List   Diagnosis Date Noted  . Chest pain 08/09/2015  . Hypokalemia 08/09/2015  . Dyslipidemia 08/09/2015  . Chronic kidney disease 08/09/2015  . CAD (coronary artery disease)   . History of lower GI bleeding   . Erectile dysfunction   . Skin lesion 05/28/2014  . 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 Prostate cancer ([place]) 01/28/2014  . Unstable angina ([place]) 09/03/2012  . Lower GI bleeding 02/12/2012  . BRBPR (bright red blood per rectum) 01/03/2012  .  Diabetes ([place]) 12/17/2010  . HYPERCHOLESTEROLEMIA 12/17/2010  . 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 Essential hypertension 12/17/2010  . BENIGN PROSTATIC HYPERTROPHY, WITH OBSTRUCTION 12/17/2010  . BPH (benign prostatic hyperplasia) 12/17/2010    Past Surgical History:  Procedure Laterality Date  . CARDIAC CATHETERIZATION  01/27/2009   ef 60%  . CARDIAC CATHETERIZATION  08/25/2012   Severe 3v obstructive CAD, continued graft patency (SVG-D,  SVG-OM1-OM2, SVG-PDA, LIMA-LAD). area of diffuse dz in the distal LCx (up to 90%) unamenable to PCI  . CERVICAL DISC SURGERY  ? 1990's   "went in on the side" (09/03/2012)  . CORONARY ARTERY BYPASS GRAFT  1996   LIMA GRAFT TO THE LAD, SAPHENOUS VEIN GRAFT SEQUENTIALLY TO THE FIRST AND SECOND OBTUSE MARGINAL VESSELS, SAPHENOUS VEIN GRAFT TO THE DIAGONAL, AND SAPHENOUS VEIN GRAFT TO THE DISTAL RIGHT CORONARY   . ESOPHAGOGASTRODUODENOSCOPY Left 11/19/2013   Procedure: ESOPHAGOGASTRODUODENOSCOPY (EGD);  Surgeon: Arta Silence, MD;  Location: [facility] ENDOSCOPY;  Service: Endoscopy;  Laterality: Left;  . FOOT SURGERY  1980's   LEFT, "shot a nail gun thru it" (09/03/2012)  . LACERATION REPAIR  1980's   LEFT HAND  . LEFT HEART CATHETERIZATION WITH CORONARY ANGIOGRAM N/A 09/04/2012   Procedure: LEFT HEART CATHETERIZATION WITH CORONARY ANGIOGRAM;  Surgeon: Peter M Martinique, MD;  Location: [facility] CATH LAB;  Service: Cardiovascular;  Laterality: N/A;  .  LEFT HEART CATHETERIZATION WITH CORONARY/GRAFT ANGIOGRAM N/A 11/22/2013   Procedure: LEFT HEART CATHETERIZATION WITH Beatrix Fetters;  Surgeon: Jettie Booze, MD;  Location: [facility] CATH LAB;  Service: Cardiovascular;  Laterality: N/A;  . LEFT HEART CATHETERIZATION WITH CORONARY/GRAFT ANGIOGRAM N/A 12/19/2014   Procedure: LEFT HEART CATHETERIZATION WITH Beatrix Fetters;  Surgeon: Peter M Martinique, MD;  Location: [facility] C[facility] LAB;  Service: Cardiovascular;  Laterality: N/A;       Home Medications    Prior  to Admission medications   Medication Sig Start Date End Date Taking? Authorizing Provider  amLODipine (NORVASC) 2.5 MG tablet Take 1 tablet (2.5 mg total) by mouth daily. 08/09/15   Velvet Bathe, MD  aspirin EC 81 MG tablet Take 81 mg by mouth daily.    Historical Provider, MD  atorvastatin (LIPITOR) 40 MG tablet Take 1 tablet (40 mg total) by mouth daily. 12/14/15   Burtis Junes, NP  betamethasone dipropionate (DIPROLENE) 0.05 % cream Apply 0.05 % topically daily.  11/26/14   Historical Provider, MD  cephALEXin (KEFLEX) 500 MG capsule Take 1 capsule (500 mg total) by mouth 2 (two) times daily. 05/31/16   Sherwood Gambler, MD  fluocinonide ointment (LIDEX) 0.05 % APPLY TO AFFECTED AREAS DAILY AS NEEDED 02/06/16   Historical Provider, MD  Insulin Glargine (LANTUS SOLOSTAR) 100 UNIT/ML Solostar Pen Inject 20 Units into the skin every morning.     Historical Provider, MD  Insulin Pen Needle (BD PEN NEEDLE NANO U/F) 32G X 4 MM MISC 1 pen by Does not apply route daily. 11/11/13   Kyra Leyland, PA-C  isosorbide mononitrate (IMDUR) 60 MG 24 hr tablet Take 1 tablet (60 mg total) by mouth daily. 03/20/16   Burtis Junes, NP  LEVEMIR FLEXTOUCH 100 UNIT/ML Pen INJECT 20 UNITS SUBCUTANEOUSLY AS DIRECTED 01/15/16   Historical Provider, MD  losartan-hydrochlorothiazide (HYZAAR) 100-25 MG per tablet Take 1 tablet by mouth daily.  11/19/14   Historical Provider, MD  metoprolol tartrate (LOPRESSOR) 25 MG tablet Take 1 tablet by mouth 2 (two) times daily. 01/31/16   Historical Provider, MD  Multiple Vitamin (MULTIVITAMIN WITH MINERALS) TABS Take 1 tablet by mouth daily.    Historical Provider, MD  nitroGLYCERIN (NITROSTAT) 0.4 MG SL tablet Place 1 tablet (0.4 mg total) under the tongue every 5 (five) minutes x 3 doses as needed for chest pain. 10/05/14   Peter M Martinique, MD  pantoprazole (PROTONIX) 40 MG tablet Take 1 tablet (40 mg total) by mouth 2 (two) times daily. 01/28/14   Biagio Borg, MD  potassium chloride SA  (K-DUR,KLOR-CON) 20 MEQ tablet Take 1 tablet (20 mEq total) by mouth 2 (two) times daily. 05/31/16   Sherwood Gambler, MD  prasugrel (EFFIENT) 10 MG TABS tablet Take 1 tablet (10 mg total) by mouth daily. 03/31/15   Peter M Martinique, MD  SitaGLIPtin-MetFORMIN HCl 618 838 2508 MG TB24 Take 1 tablet by mouth once. Patient taking differently: Take 1 tablet by mouth daily.  12/22/14   Eileen Stanford, PA-C  traMADol (ULTRAM) 50 MG tablet Take 1 tablet (50 mg total) by mouth every 6 (six) hours as needed for moderate pain. 03/28/15   Peter M Martinique, MD    Family History Family History  Problem Relation Age of Onset  . Cancer Mother     breast  . Hypertension Mother   . Hypertension Father   . Diabetes Other     2 siblings, 1 of whom is deceased  . Hypertension Other   .  Cancer Other     Lung Cancer  . Cancer Other     FH of Prostate Cancer 1st degree relative  . CAD Brother     Social History Social History  Substance Use Topics  . Smoking status: Former Smoker    Packs/day: 1.00    Years: 37.00    Types: Cigarettes    Quit date: 10/14/2002  . Smokeless tobacco: Never Used  . Alcohol use 0.0 oz/week     Comment: 6 pack beer per week     Allergies   Demerol and Meperidine and related   Review of Systems Review of Systems  Constitutional: Negative for fever.  Gastrointestinal: Negative for abdominal pain.  Genitourinary: Positive for difficulty urinating, dysuria and hematuria. Negative for penile pain and testicular pain.  Musculoskeletal: Negative for back pain.  All other systems reviewed and are negative.    Physical Exam Updated Vital Signs BP 132/86 (BP Location: Left Arm)   Pulse 80   Temp 97.7 F (36.5 C) (Oral)   Resp 15   Ht 5' 7.5" (1.715 m)   Wt 175 lb (79.4 kg)   SpO2 97%   BMI 27.00 kg/m   Physical Exam  Constitutional: He is oriented to person, place, and time. He appears well-developed and well-nourished.  HENT:  Head: Normocephalic and atraumatic.    Right Ear: External ear normal.  Left Ear: External ear normal.  Nose: Nose normal.  Eyes: Right eye exhibits no discharge. Left eye exhibits no discharge.  Neck: Neck supple.  Cardiovascular: Normal rate, regular rhythm and normal heart sounds.   Pulmonary/Chest: Effort normal and breath sounds normal.  Abdominal: Soft. He exhibits no distension. There is no tenderness.  Genitourinary: Penis normal. Right testis shows no swelling and no tenderness. Left testis shows no swelling and no tenderness. Uncircumcised. No penile tenderness. No discharge found.  Musculoskeletal: He exhibits no edema.  Neurological: He is alert and oriented to person, place, and time.  Skin: Skin is warm and dry.  Nursing note and vitals reviewed.    ED Treatments / Results  Labs (all labs ordered are listed, but only abnormal results are displayed) Labs Reviewed  URINALYSIS, ROUTINE W REFLEX MICROSCOPIC (NOT AT [facility]) - Abnormal; Notable for the following:       Result Value   Color, Urine RED (*)    APPearance TURBID (*)    Hgb urine dipstick LARGE (*)    Protein, ur >300 (*)    Leukocytes, UA SMALL (*)    All other components within normal limits  BASIC METABOLIC PANEL - Abnormal; Notable for the following:    Potassium 2.7 (*)    Glucose, Bld 153 (*)    Creatinine, Ser 1.35 (*)    GFR calc non Af Amer 53 (*)    All other components within normal limits  CBC WITH DIFFERENTIAL/PLATELET - Abnormal; Notable for the following:    Hemoglobin 12.5 (*)    HCT 36.7 (*)    RDW 15.7 (*)    All other components within normal limits  URINE MICROSCOPIC-ADD ON - Abnormal; Notable for the following:    Bacteria, UA FEW (*)    All other components within normal limits    EKG  EKG Interpretation None       Radiology No results found.  Procedures Procedures (including critical care time)  Medications Ordered in ED Medications  potassium chloride SA (K-DUR,KLOR-CON) CR tablet 40 mEq (not  administered)     Initial Impression /  Assessment and Plan / ED Course  I have reviewed the triage vital signs and the nursing notes.  Pertinent labs & imaging results that were available during my care of the patient were reviewed by me and considered in my medical decision making (see chart for details).  Clinical Course  Comment By Time  Will check labs, urine, and re-eval. Currently feels asymptomatic. Sherwood Gambler, MD 08/18 2013    Patient was able to urinate again without difficulty. At this time he does not appear to have urinary retention. He overall feels well. There are some white blood cells and bacteria in his urine, given no other obvious source, will treat with Keflex. Will treat his hypokalemia with outpatient oral potassium. Follow-up with his urologist and return if retention symptoms return.  Final Clinical Impressions(s) / ED Diagnoses   Final diagnoses:  Hematuria  UTI (lower urinary tract infection)  Hypokalemia    New Prescriptions New Prescriptions   CEPHALEXIN (KEFLEX) 500 MG CAPSULE    Take 1 capsule (500 mg total) by mouth 2 (two) times daily.   POTASSIUM CHLORIDE SA (K-DUR,KLOR-CON) 20 MEQ TABLET    Take 1 tablet (20 mEq total) by mouth 2 (two) times daily.     Sherwood Gambler, MD 05/31/16 2203   10:34 PM ADDENDUM: Patient complaining of recurrent pain and inability to urinate just prior to discharge. Place Foley catheter, give leg bag, and continue follow-up with urology next week for voiding trial and removal. This all seems to be coming from clots.   Sherwood Gambler, MD 05/31/16 [id number]

## 2016-08-15 ENCOUNTER — Other Ambulatory Visit: Payer: Self-pay | Admitting: Cardiology

## 2016-08-15 ENCOUNTER — Other Ambulatory Visit: Payer: Self-pay | Admitting: *Deleted

## 2016-08-15 MED ORDER — PRASUGREL HCL 10 MG PO TABS: 10.0000 mg | ORAL_TABLET | Freq: Every day | ORAL | 5 refills | Status: DC

## 2016-08-15 NOTE — Telephone Encounter (Signed)
Rx(s) sent to pharmacy electronically.  

## 2016-11-11 ENCOUNTER — Telehealth: Payer: Self-pay | Admitting: Cardiology

## 2016-11-11 NOTE — Telephone Encounter (Signed)
Attempted to return call to patient - patient unavailable to take calls. No VM

## 2016-11-11 NOTE — Telephone Encounter (Signed)
Antonio [name] is calling because he wants to make sure he is taking the right kind of medication . He thinks its the wrong kind . Please call    Thanks

## 2016-11-12 NOTE — Telephone Encounter (Signed)
Tried to call pt back-"the person you called is not able to recieve calls at this time" per phone message

## 2016-12-05 NOTE — Progress Notes (Deleted)
CARDIOLOGY OFFICE NOTE  Date:  12/05/2016    NYSEAN [name] Date of Birth: 01/09/1946 Medical Record [id number]  PCP:  Myriam Jacobson, MD  Cardiologist:  [place]    No chief complaint on file.   History of Present Illness: Antonio [name] is a 71 y.o. male who is seen for follow up CAD. He has a history of DM, HTN, HLD, CAD s/p CABG (1996) and hx of GI bleed.  Prior catheterization in February 2015 showed an occluded LAD, severe disease in the obtuse marginal and an occluded RCA. The LIMA to the LAD was patent, saphenous vein graft to the diagonal patent, jump graft to the first and second marginals patent and patient saphenous vein graft to the PDA patent. Patient's ejection fraction was 55%. Echocardiogram in February 2015 showed normal LV function, grade 1 diastolic dysfunction, mild left atrial enlargement and mild mitral regurgitation.   He was admitted to [facility] on 12/17/14 with unstable angina. Cardiac cath was unchanged. On follow up he continued to have chest pain with exertion. He was started on Imdur with resolution of these symptoms.   On follow up today he is doing well. Still facing the stress of caring for his wife following a closed head injury. He denies any significant chest pain or dyspnea. No palpitations.    Past Medical History:  Diagnosis Date  . Alcohol abuse, in remission   . Bleeding per rectum    "related to prostate cancer and hemorrhoids" (09/03/2012)  . BPH (benign prostatic hyperplasia)   . CAD (coronary artery disease)    a. s/p CABG 1996  b. LHC in 11/2013 w/ patent grafts. c. 12/19/14 re-look cath with patent grafts and good LVF  . Erectile dysfunction   . GERD (gastroesophageal reflux disease)   . History of lower GI bleeding   . HLD (hyperlipidemia)   . HTN (hypertension)   . Prostate cancer ([place])    a. s/p radiation   . Type II diabetes mellitus ([place])     Past Surgical History:  Procedure Laterality Date  . CARDIAC  CATHETERIZATION  01/27/2009   ef 60%  . CARDIAC CATHETERIZATION  08/25/2012   Severe 3v obstructive CAD, continued graft patency (SVG-D,  SVG-OM1-OM2, SVG-PDA, LIMA-LAD). area of diffuse dz in the distal LCx (up to 90%) unamenable to PCI  . CERVICAL DISC SURGERY  ? 1990's   "went in on the side" (09/03/2012)  . CORONARY ARTERY BYPASS GRAFT  1996   LIMA GRAFT TO THE LAD, SAPHENOUS VEIN GRAFT SEQUENTIALLY TO THE FIRST AND SECOND OBTUSE MARGINAL VESSELS, SAPHENOUS VEIN GRAFT TO THE DIAGONAL, AND SAPHENOUS VEIN GRAFT TO THE DISTAL RIGHT CORONARY   . ESOPHAGOGASTRODUODENOSCOPY Left 11/19/2013   Procedure: ESOPHAGOGASTRODUODENOSCOPY (EGD);  Surgeon: Arta Silence, MD;  Location: [facility] ENDOSCOPY;  Service: Endoscopy;  Laterality: Left;  . FOOT SURGERY  1980's   LEFT, "shot a nail gun thru it" (09/03/2012)  . LACERATION REPAIR  1980's   LEFT HAND  . LEFT HEART CATHETERIZATION WITH CORONARY ANGIOGRAM N/A 09/04/2012   Procedure: LEFT HEART CATHETERIZATION WITH CORONARY ANGIOGRAM;  Surgeon: Briauna Gilmartin M [place], MD;  Location: [facility] CATH LAB;  Service: Cardiovascular;  Laterality: N/A;  . LEFT HEART CATHETERIZATION WITH CORONARY/GRAFT ANGIOGRAM N/A 11/22/2013   Procedure: LEFT HEART CATHETERIZATION WITH Beatrix Fetters;  Surgeon: Jettie Booze, MD;  Location: [facility] CATH LAB;  Service: Cardiovascular;  Laterality: N/A;  . LEFT HEART CATHETERIZATION WITH CORONARY/GRAFT ANGIOGRAM N/A 12/19/2014   Procedure: LEFT HEART CATHETERIZATION  WITH Beatrix Fetters;  Surgeon: Celestine Bougie M [place], MD;  Location: [facility] CATH LAB;  Service: Cardiovascular;  Laterality: N/A;     Medications: Current Outpatient Prescriptions  Medication Sig Dispense Refill  . amLODipine (NORVASC) 2.5 MG tablet Take 1 tablet (2.5 mg total) by mouth daily. 30 tablet 0  . aspirin EC 81 MG tablet Take 81 mg by mouth daily.    Marland Kitchen atorvastatin (LIPITOR) 40 MG tablet Take 1 tablet (40 mg total) by mouth daily. 90 tablet 0  . betamethasone  dipropionate (DIPROLENE) 0.05 % cream Apply 0.05 % topically daily.   0  . cephALEXin (KEFLEX) 500 MG capsule Take 1 capsule (500 mg total) by mouth 2 (two) times daily. 10 capsule 0  . fluocinonide ointment (LIDEX) 0.05 % APPLY TO AFFECTED AREAS DAILY AS NEEDED  0  . Insulin Glargine (LANTUS SOLOSTAR) 100 UNIT/ML Solostar Pen Inject 20 Units into the skin every morning.     . Insulin Pen Needle (BD PEN NEEDLE NANO U/F) 32G X 4 MM MISC 1 pen by Does not apply route daily. 30 each 11  . isosorbide mononitrate (IMDUR) 60 MG 24 hr tablet Take 1 tablet (60 mg total) by mouth daily. 90 tablet 3  . LEVEMIR FLEXTOUCH 100 UNIT/ML Pen INJECT 20 UNITS SUBCUTANEOUSLY AS DIRECTED  0  . losartan-hydrochlorothiazide (HYZAAR) 100-25 MG per tablet Take 1 tablet by mouth daily.   0  . metoprolol tartrate (LOPRESSOR) 25 MG tablet take 1 tablet by mouth twice a day 180 tablet 1  . Multiple Vitamin (MULTIVITAMIN WITH MINERALS) TABS Take 1 tablet by mouth daily.    . nitroGLYCERIN (NITROSTAT) 0.4 MG SL tablet Place 1 tablet (0.4 mg total) under the tongue every 5 (five) minutes x 3 doses as needed for chest pain. 25 tablet 3  . pantoprazole (PROTONIX) 40 MG tablet Take 1 tablet (40 mg total) by mouth 2 (two) times daily. 60 tablet 11  . potassium chloride SA (K-DUR,KLOR-CON) 20 MEQ tablet take 1 tablet by mouth twice a day 180 tablet 1  . prasugrel (EFFIENT) 10 MG TABS tablet Take 1 tablet (10 mg total) by mouth daily. 30 tablet 5  . predniSONE (DELTASONE) 10 MG tablet     . SitaGLIPtin-MetFORMIN HCl [phone] MG TB24 Take 1 tablet by mouth once. (Patient taking differently: Take 1 tablet by mouth daily. ) 30 tablet 11  . traMADol (ULTRAM) 50 MG tablet Take 1 tablet (50 mg total) by mouth every 6 (six) hours as needed for moderate pain. 60 tablet 3   No current facility-administered medications for this visit.     Allergies: Allergies  Allergen Reactions  . Demerol Other (See Comments)    hallucinations  .  Meperidine And Related     Social History: The patient  reports that he quit smoking about 14 years ago. His smoking use included Cigarettes. He has a 37.00 pack-year smoking history. He has never used smokeless tobacco. He reports that he drinks alcohol. He reports that he does not use drugs.   Family History: The patient's family history includes CAD in his brother; Cancer in his mother, other, and other; Diabetes in his other; Hypertension in his father, mother, and other.   Review of Systems: Please see the history of present illness.   Otherwise, the review of systems is positive for none.   All other systems are reviewed and negative.   Physical Exam: VS:  There were no vitals taken for this visit. Marland Kitchen  BMI There is  no height or weight on file to calculate BMI.  Wt Readings from Last 3 Encounters:  05/31/16 175 lb (79.4 kg)  05/31/16 175 lb (79.4 kg)  02/19/16 178 lb 4 oz (80.9 kg)    General: Pleasant. Well developed, well nourished and in no acute distress.  HEENT: Normal. Neck: Supple, no JVD, carotid bruits, or masses noted.  Cardiac: Regular rate and rhythm. No murmurs, rubs, or gallops. No edema.  Respiratory:  Lungs are clear to auscultation bilaterally with normal work of breathing.  GI: Soft and nontender.  MS: No deformity or atrophy. Gait and ROM intact. Skin: Warm and dry. Color is normal.  Neuro:  Strength and sensation are intact and no gross focal deficits noted.  Psych: Alert, appropriate and with normal affect.   LABORATORY DATA:  EKG:  EKG is not ordered today.  Lab Results  Component Value Date   WBC 5.4 05/31/2016   HGB 12.5 (L) 05/31/2016   HCT 36.7 (L) 05/31/2016   PLT 160 05/31/2016   GLUCOSE 153 (H) 05/31/2016   CHOL 112 02/03/2015   TRIG 125.0 02/03/2015   HDL 30.70 (L) 02/03/2015   LDLCALC 56 02/03/2015   ALT 23 02/03/2015   AST 21 02/03/2015   NA 138 05/31/2016   K 2.7 (LL) 05/31/2016   CL 106 05/31/2016   CREATININE 1.35 (H)  05/31/2016   BUN 16 05/31/2016   CO2 24 05/31/2016   TSH 0.932 12/17/2014   PSA 0.96 05/27/2014   INR 1.04 12/19/2014   HGBA1C 7.2 (H) 08/09/2015   MICROALBUR 2.1 (H) 12/14/2013    BNP (last 3 results) No results for input(s): BNP in the last 8760 hours.  ProBNP (last 3 results) No results for input(s): PROBNP in the last 8760 hours.   Other Studies Reviewed Today: none  Assessment/Plan: 1. Chest pain - s/p cardiac cath in March 2016 without new change. He has improved with  Imdur. Asymptomatic now. Continue medical management.   2. DM - elevated A1C - followed by Dr. Mancel Bale.  3. HLD - now on Lipitor. Labs with primary MD  4. HTN - BP ok on current regimen.   Current medicines are reviewed with the patient today.  The patient does not have concerns regarding medicines other than what has been noted above.  The following changes have been made:  See above.  Labs/ tests ordered today include:   No orders of the defined types were placed in this encounter.    Disposition:   FU with me in 6 months.  Patient is agreeable to this plan and will call if any problems develop in the interim.   Signed: Shyam Dawson [place] MD, [facility]    12/05/2016 7:51 AM

## 2016-12-06 ENCOUNTER — Ambulatory Visit: Payer: Medicare HMO | Admitting: Cardiology

## 2017-02-08 NOTE — Progress Notes (Deleted)
CARDIOLOGY OFFICE NOTE  Date:  02/08/2017    Antonio [name] Date of Birth: May 30, 1946 Medical Record #[id number]  PCP:  Myriam Jacobson, MD  Cardiologist:  [place]    No chief complaint on file.   History of Present Illness: Antonio [name] is a 71 y.o. male who is seen for follow up CAD. He has a history of DM, HTN, HLD, CAD s/p CABG (1996) and hx of GI bleed.  Prior catheterization in February 2015 showed an occluded LAD, severe disease in the obtuse marginal and an occluded RCA. The LIMA to the LAD was patent, saphenous vein graft to the diagonal patent, jump graft to the first and second marginals patent and patient saphenous vein graft to the PDA patent. Patient's ejection fraction was 55%. Echocardiogram in February 2015 showed normal LV function, grade 1 diastolic dysfunction, mild left atrial enlargement and mild mitral regurgitation.   He was admitted to [facility] on 12/17/14 with unstable angina. Cardiac cath was unchanged. On follow up he continued to have chest pain with exertion. He was started on Imdur with resolution of these symptoms.   On follow up today he is doing well. Still facing the stress of caring for his wife following a closed head injury. He denies any significant chest pain or dyspnea. No palpitations.    Past Medical History:  Diagnosis Date  . Alcohol abuse, in remission   . Bleeding per rectum    "related to prostate cancer and hemorrhoids" (09/03/2012)  . BPH (benign prostatic hyperplasia)   . CAD (coronary artery disease)    a. s/p CABG 1996  b. LHC in 11/2013 w/ patent grafts. c. 12/19/14 re-look cath with patent grafts and good LVF  . Erectile dysfunction   . GERD (gastroesophageal reflux disease)   . History of lower GI bleeding   . HLD (hyperlipidemia)   . HTN (hypertension)   . Prostate cancer ([place])    a. s/p radiation   . Type II diabetes mellitus ([place])     Past Surgical History:  Procedure Laterality Date  . CARDIAC  CATHETERIZATION  01/27/2009   ef 60%  . CARDIAC CATHETERIZATION  08/25/2012   Severe 3v obstructive CAD, continued graft patency (SVG-D,  SVG-OM1-OM2, SVG-PDA, LIMA-LAD). area of diffuse dz in the distal LCx (up to 90%) unamenable to PCI  . CERVICAL DISC SURGERY  ? 1990's   "went in on the side" (09/03/2012)  . CORONARY ARTERY BYPASS GRAFT  1996   LIMA GRAFT TO THE LAD, SAPHENOUS VEIN GRAFT SEQUENTIALLY TO THE FIRST AND SECOND OBTUSE MARGINAL VESSELS, SAPHENOUS VEIN GRAFT TO THE DIAGONAL, AND SAPHENOUS VEIN GRAFT TO THE DISTAL RIGHT CORONARY   . ESOPHAGOGASTRODUODENOSCOPY Left 11/19/2013   Procedure: ESOPHAGOGASTRODUODENOSCOPY (EGD);  Surgeon: Arta Silence, MD;  Location: [facility] ENDOSCOPY;  Service: Endoscopy;  Laterality: Left;  . FOOT SURGERY  1980's   LEFT, "shot a nail gun thru it" (09/03/2012)  . LACERATION REPAIR  1980's   LEFT HAND  . LEFT HEART CATHETERIZATION WITH CORONARY ANGIOGRAM N/A 09/04/2012   Procedure: LEFT HEART CATHETERIZATION WITH CORONARY ANGIOGRAM;  Surgeon: Peter M Martin[place], MD;  Location: [facility] CATH LAB;  Service: Cardiovascular;  Laterality: N/A;  . LEFT HEART CATHETERIZATION WITH CORONARY/GRAFT ANGIOGRAM N/A 11/22/2013   Procedure: LEFT HEART CATHETERIZATION WITH Beatrix Fetters;  Surgeon: Jettie Booze, MD;  Location: [facility] CATH LAB;  Service: Cardiovascular;  Laterality: N/A;  . LEFT HEART CATHETERIZATION WITH CORONARY/GRAFT ANGIOGRAM N/A 12/19/2014   Procedure: LEFT HEART CATHETERIZATION  WITH Beatrix Fetters;  Surgeon: Peter M [place], MD;  Location: [facility] CATH LAB;  Service: Cardiovascular;  Laterality: N/A;     Medications: Current Outpatient Prescriptions  Medication Sig Dispense Refill  . amLODipine (NORVASC) 2.5 MG tablet Take 1 tablet (2.5 mg total) by mouth daily. 30 tablet 0  . aspirin EC 81 MG tablet Take 81 mg by mouth daily.    Marland Kitchen atorvastatin (LIPITOR) 40 MG tablet Take 1 tablet (40 mg total) by mouth daily. 90 tablet 0  . betamethasone  dipropionate (DIPROLENE) 0.05 % cream Apply 0.05 % topically daily.   0  . cephALEXin (KEFLEX) 500 MG capsule Take 1 capsule (500 mg total) by mouth 2 (two) times daily. 10 capsule 0  . fluocinonide ointment (LIDEX) 0.05 % APPLY TO AFFECTED AREAS DAILY AS NEEDED  0  . Insulin Glargine (LANTUS SOLOSTAR) 100 UNIT/ML Solostar Pen Inject 20 Units into the skin every morning.     . Insulin Pen Needle (BD PEN NEEDLE NANO U/F) 32G X 4 MM MISC 1 pen by Does not apply route daily. 30 each 11  . isosorbide mononitrate (IMDUR) 60 MG 24 hr tablet Take 1 tablet (60 mg total) by mouth daily. 90 tablet 3  . LEVEMIR FLEXTOUCH 100 UNIT/ML Pen INJECT 20 UNITS SUBCUTANEOUSLY AS DIRECTED  0  . losartan-hydrochlorothiazide (HYZAAR) 100-25 MG per tablet Take 1 tablet by mouth daily.   0  . metoprolol tartrate (LOPRESSOR) 25 MG tablet take 1 tablet by mouth twice a day 180 tablet 1  . Multiple Vitamin (MULTIVITAMIN WITH MINERALS) TABS Take 1 tablet by mouth daily.    . nitroGLYCERIN (NITROSTAT) 0.4 MG SL tablet Place 1 tablet (0.4 mg total) under the tongue every 5 (five) minutes x 3 doses as needed for chest pain. 25 tablet 3  . pantoprazole (PROTONIX) 40 MG tablet Take 1 tablet (40 mg total) by mouth 2 (two) times daily. 60 tablet 11  . potassium chloride SA (K-DUR,KLOR-CON) 20 MEQ tablet take 1 tablet by mouth twice a day 180 tablet 1  . prasugrel (EFFIENT) 10 MG TABS tablet Take 1 tablet (10 mg total) by mouth daily. 30 tablet 5  . predniSONE (DELTASONE) 10 MG tablet     . SitaGLIPtin-MetFORMIN HCl [phone] MG TB24 Take 1 tablet by mouth once. (Patient taking differently: Take 1 tablet by mouth daily. ) 30 tablet 11  . traMADol (ULTRAM) 50 MG tablet Take 1 tablet (50 mg total) by mouth every 6 (six) hours as needed for moderate pain. 60 tablet 3   No current facility-administered medications for this visit.     Allergies: Allergies  Allergen Reactions  . Demerol Other (See Comments)    hallucinations  .  Meperidine And Related     Social History: The patient  reports that he quit smoking about 14 years ago. His smoking use included Cigarettes. He has a 37.00 pack-year smoking history. He has never used smokeless tobacco. He reports that he drinks alcohol. He reports that he does not use drugs.   Family History: The patient's family history includes CAD in his brother; Cancer in his mother, other, and other; Diabetes in his other; Hypertension in his father, mother, and other.   Review of Systems: Please see the history of present illness.   Otherwise, the review of systems is positive for none.   All other systems are reviewed and negative.   Physical Exam: VS:  There were no vitals taken for this visit. Marland Kitchen  BMI There is  no height or weight on file to calculate BMI.  Wt Readings from Last 3 Encounters:  05/31/16 175 lb (79.4 kg)  05/31/16 175 lb (79.4 kg)  02/19/16 178 lb 4 oz (80.9 kg)    General: Pleasant. Well developed, well nourished and in no acute distress.  HEENT: Normal. Neck: Supple, no JVD, carotid bruits, or masses noted.  Cardiac: Regular rate and rhythm. No murmurs, rubs, or gallops. No edema.  Respiratory:  Lungs are clear to auscultation bilaterally with normal work of breathing.  GI: Soft and nontender.  MS: No deformity or atrophy. Gait and ROM intact. Skin: Warm and dry. Color is normal.  Neuro:  Strength and sensation are intact and no gross focal deficits noted.  Psych: Alert, appropriate and with normal affect.   LABORATORY DATA:  EKG:  EKG is not ordered today.  Lab Results  Component Value Date   WBC 5.4 05/31/2016   HGB 12.5 (L) 05/31/2016   HCT 36.7 (L) 05/31/2016   PLT 160 05/31/2016   GLUCOSE 153 (H) 05/31/2016   CHOL 112 02/03/2015   TRIG 125.0 02/03/2015   HDL 30.70 (L) 02/03/2015   LDLCALC 56 02/03/2015   ALT 23 02/03/2015   AST 21 02/03/2015   NA 138 05/31/2016   K 2.7 (LL) 05/31/2016   CL 106 05/31/2016   CREATININE 1.35 (H)  05/31/2016   BUN 16 05/31/2016   CO2 24 05/31/2016   TSH 0.932 12/17/2014   PSA 0.96 05/27/2014   INR 1.04 12/19/2014   HGBA1C 7.2 (H) 08/09/2015   MICROALBUR 2.1 (H) 12/14/2013    BNP (last 3 results) No results for input(s): BNP in the last 8760 hours.  ProBNP (last 3 results) No results for input(s): PROBNP in the last 8760 hours.   Other Studies Reviewed Today: none  Assessment/Plan: 1. Chest pain - s/p cardiac cath in March 2016 without new change. He has improved with  Imdur. Asymptomatic now. Continue medical management.   2. DM - elevated A1C - followed by Dr. Mancel Bale.  3. HLD - now on Lipitor. Labs with primary MD  4. HTN - BP ok on current regimen.   Current medicines are reviewed with the patient today.  The patient does not have concerns regarding medicines other than what has been noted above.  The following changes have been made:  See above.  Labs/ tests ordered today include:   No orders of the defined types were placed in this encounter.    Disposition:   FU with me in 6 months.  Patient is agreeable to this plan and will call if any problems develop in the interim.   Signed: Peter [place] MD, [facility]    02/08/2017 7:48 AM

## 2017-02-10 ENCOUNTER — Ambulatory Visit: Payer: Medicare HMO | Admitting: Cardiology

## 2017-02-10 ENCOUNTER — Other Ambulatory Visit: Payer: Self-pay | Admitting: Cardiology

## 2017-02-10 NOTE — Telephone Encounter (Signed)
REFILL 

## 2017-03-26 ENCOUNTER — Other Ambulatory Visit: Payer: Self-pay | Admitting: Cardiology

## 2017-03-26 NOTE — Telephone Encounter (Signed)
Rx(s) sent to pharmacy electronically.  

## 2017-04-08 ENCOUNTER — Other Ambulatory Visit: Payer: Self-pay | Admitting: Nurse Practitioner

## 2017-04-15 ENCOUNTER — Other Ambulatory Visit: Payer: Self-pay | Admitting: Cardiology

## 2017-04-26 NOTE — Progress Notes (Deleted)
CARDIOLOGY OFFICE NOTE  Date:  04/26/2017    Antonio [name] Date of Birth: 1946/02/20 Medical Record #[id number]  PCP:  [name], MD  Cardiologist:  [place]    No chief complaint on file.   History of Present Illness: Antonio [name] is a 71 y.o. male who is seen for follow up CAD. He has a history of DM, HTN, HLD, CAD s/p CABG (1996) and hx of GI bleed.  Prior catheterization in February 2015 showed an occluded LAD, severe disease in the obtuse marginal and an occluded RCA. The LIMA to the LAD was patent, saphenous vein graft to the diagonal patent, jump graft to the first and second marginals patent and patient saphenous vein graft to the PDA patent. Patient's ejection fraction was 55%. Echocardiogram in February 2015 showed normal LV function, grade 1 diastolic dysfunction, mild left atrial enlargement and mild mitral regurgitation.   He was admitted to [facility] on 12/17/14 with unstable angina. Cardiac cath was unchanged. On follow up he continued to have chest pain with exertion. He was started on Imdur with resolution of these symptoms.   On follow up today he is doing well. Still facing the stress of caring for his wife following a closed head injury. He denies any significant chest pain or dyspnea. No palpitations.    Past Medical History:  Diagnosis Date  . Alcohol abuse, in remission   . Bleeding per rectum    "related to prostate cancer and hemorrhoids" (09/03/2012)  . BPH (benign prostatic hyperplasia)   . CAD (coronary artery disease)    a. s/p CABG 1996  b. LHC in 11/2013 w/ patent grafts. c. 12/19/14 re-look cath with patent grafts and good LVF  . Erectile dysfunction   . GERD (gastroesophageal reflux disease)   . History of lower GI bleeding   . HLD (hyperlipidemia)   . HTN (hypertension)   . Prostate cancer ([place])    a. s/p radiation   . Type II diabetes mellitus ([place])     Past Surgical History:  Procedure Laterality Date  . CARDIAC CATHETERIZATION   01/27/2009   ef 60%  . CARDIAC CATHETERIZATION  08/25/2012   Severe 3v obstructive CAD, continued graft patency (SVG-D,  SVG-OM1-OM2, SVG-PDA, LIMA-LAD). area of diffuse dz in the distal LCx (up to 90%) unamenable to PCI  . CERVICAL DISC SURGERY  ? 1990's   "went in on the side" (09/03/2012)  . CORONARY ARTERY BYPASS GRAFT  1996   LIMA GRAFT TO THE LAD, SAPHENOUS VEIN GRAFT SEQUENTIALLY TO THE FIRST AND SECOND OBTUSE MARGINAL VESSELS, SAPHENOUS VEIN GRAFT TO THE DIAGONAL, AND SAPHENOUS VEIN GRAFT TO THE DISTAL RIGHT CORONARY   . ESOPHAGOGASTRODUODENOSCOPY Left 11/19/2013   Procedure: ESOPHAGOGASTRODUODENOSCOPY (EGD);  Surgeon: Arta Silence, MD;  Location: [facility] ENDOSCOPY;  Service: Endoscopy;  Laterality: Left;  . FOOT SURGERY  1980's   LEFT, "shot a nail gun thru it" (09/03/2012)  . LACERATION REPAIR  1980's   LEFT HAND  . LEFT HEART CATHETERIZATION WITH CORONARY ANGIOGRAM N/A 09/04/2012   Procedure: LEFT HEART CATHETERIZATION WITH CORONARY ANGIOGRAM;  Surgeon: Braydon Kullman M Martinique, MD;  Location: [facility] CATH LAB;  Service: Cardiovascular;  Laterality: N/A;  . LEFT HEART CATHETERIZATION WITH CORONARY/GRAFT ANGIOGRAM N/A 11/22/2013   Procedure: LEFT HEART CATHETERIZATION WITH Beatrix Fetters;  Surgeon: Jettie Booze, MD;  Location: [facility] CATH LAB;  Service: Cardiovascular;  Laterality: N/A;  . LEFT HEART CATHETERIZATION WITH CORONARY/GRAFT ANGIOGRAM N/A 12/19/2014   Procedure: LEFT HEART CATHETERIZATION WITH  Beatrix Fetters;  Surgeon: Sherman Lipuma M Martinique, MD;  Location: [facility] CATH LAB;  Service: Cardiovascular;  Laterality: N/A;     Medications: Current Outpatient Prescriptions  Medication Sig Dispense Refill  . amLODipine (NORVASC) 2.5 MG tablet Take 1 tablet (2.5 mg total) by mouth daily. 30 tablet 0  . aspirin EC 81 MG tablet Take 81 mg by mouth daily.    Marland Kitchen atorvastatin (LIPITOR) 40 MG tablet Take 1 tablet (40 mg total) by mouth daily. 90 tablet 0  . betamethasone dipropionate (DIPROLENE)  0.05 % cream Apply 0.05 % topically daily.   0  . cephALEXin (KEFLEX) 500 MG capsule Take 1 capsule (500 mg total) by mouth 2 (two) times daily. 10 capsule 0  . EFFIENT 10 MG TABS tablet take 1 tablet by mouth daily 30 tablet 1  . fluocinonide ointment (LIDEX) 0.05 % APPLY TO AFFECTED AREAS DAILY AS NEEDED  0  . Insulin Glargine (LANTUS SOLOSTAR) 100 UNIT/ML Solostar Pen Inject 20 Units into the skin every morning.     . Insulin Pen Needle (BD PEN NEEDLE NANO U/F) 32G X 4 MM MISC 1 pen by Does not apply route daily. 30 each 11  . isosorbide mononitrate (IMDUR) 60 MG 24 hr tablet TAKE 1 TABLET BY MOUTH DAILY 30 tablet 0  . LEVEMIR FLEXTOUCH 100 UNIT/ML Pen INJECT 20 UNITS SUBCUTANEOUSLY AS DIRECTED  0  . losartan-hydrochlorothiazide (HYZAAR) 100-25 MG per tablet Take 1 tablet by mouth daily.   0  . metoprolol tartrate (LOPRESSOR) 25 MG tablet Take 1 tablet (25 mg total) by mouth 2 (two) times daily. KEEP OV. 180 tablet 1  . Multiple Vitamin (MULTIVITAMIN WITH MINERALS) TABS Take 1 tablet by mouth daily.    . nitroGLYCERIN (NITROSTAT) 0.4 MG SL tablet place 1 tablet UNDER THE TONGUE every 5 minutes FOR 3 DOSES AS NEEDED FOR CHEST PAIN 25 tablet 3  . pantoprazole (PROTONIX) 40 MG tablet Take 1 tablet (40 mg total) by mouth 2 (two) times daily. 60 tablet 11  . potassium chloride SA (K-DUR,KLOR-CON) 20 MEQ tablet take 1 tablet by mouth twice a day 180 tablet 1  . predniSONE (DELTASONE) 10 MG tablet     . SitaGLIPtin-MetFORMIN HCl [phone] MG TB24 Take 1 tablet by mouth once. (Patient taking differently: Take 1 tablet by mouth daily. ) 30 tablet 11  . traMADol (ULTRAM) 50 MG tablet Take 1 tablet (50 mg total) by mouth every 6 (six) hours as needed for moderate pain. 60 tablet 3   No current facility-administered medications for this visit.     Allergies: Allergies  Allergen Reactions  . Demerol Other (See Comments)    hallucinations  . Meperidine And Related     Social History: The patient   reports that he quit smoking about 14 years ago. His smoking use included Cigarettes. He has a 37.00 pack-year smoking history. He has never used smokeless tobacco. He reports that he drinks alcohol. He reports that he does not use drugs.   Family History: The patient's family history includes CAD in his brother; Cancer in his mother, other, and other; Diabetes in his other; Hypertension in his father, mother, and other.   Review of Systems: Please see the history of present illness.   Otherwise, the review of systems is positive for none.   All other systems are reviewed and negative.   Physical Exam: VS:  There were no vitals taken for this visit. Marland Kitchen  BMI There is no height or weight on file  to calculate BMI.  Wt Readings from Last 3 Encounters:  05/31/16 175 lb (79.4 kg)  05/31/16 175 lb (79.4 kg)  02/19/16 178 lb 4 oz (80.9 kg)    General: Pleasant. Well developed, well nourished and in no acute distress.  HEENT: Normal. Neck: Supple, no JVD, carotid bruits, or masses noted.  Cardiac: Regular rate and rhythm. No murmurs, rubs, or gallops. No edema.  Respiratory:  Lungs are clear to auscultation bilaterally with normal work of breathing.  GI: Soft and nontender.  MS: No deformity or atrophy. Gait and ROM intact. Skin: Warm and dry. Color is normal.  Neuro:  Strength and sensation are intact and no gross focal deficits noted.  Psych: Alert, appropriate and with normal affect.   LABORATORY DATA:  EKG:  EKG is not ordered today.  Lab Results  Component Value Date   WBC 5.4 05/31/2016   HGB 12.5 (L) 05/31/2016   HCT 36.7 (L) 05/31/2016   PLT 160 05/31/2016   GLUCOSE 153 (H) 05/31/2016   CHOL 112 02/03/2015   TRIG 125.0 02/03/2015   HDL 30.70 (L) 02/03/2015   LDLCALC 56 02/03/2015   ALT 23 02/03/2015   AST 21 02/03/2015   NA 138 05/31/2016   K 2.7 (LL) 05/31/2016   CL 106 05/31/2016   CREATININE 1.35 (H) 05/31/2016   BUN 16 05/31/2016   CO2 24 05/31/2016   TSH 0.932  12/17/2014   PSA 0.96 05/27/2014   INR 1.04 12/19/2014   HGBA1C 7.2 (H) 08/09/2015   MICROALBUR 2.1 (H) 12/14/2013    BNP (last 3 results) No results for input(s): BNP in the last 8760 hours.  ProBNP (last 3 results) No results for input(s): PROBNP in the last 8760 hours.   Other Studies Reviewed Today: none  Assessment/Plan: 1. Chest pain - s/p cardiac cath in March 2016 without new change. He has improved with  Imdur. Asymptomatic now. Continue medical management.   2. DM - elevated A1C - followed by Dr. Mancel Bale.  3. HLD - now on Lipitor. Labs with primary MD  4. HTN - BP ok on current regimen.   Current medicines are reviewed with the patient today.  The patient does not have concerns regarding medicines other than what has been noted above.  The following changes have been made:  See above.  Labs/ tests ordered today include:   No orders of the defined types were placed in this encounter.    Disposition:   FU with me in 6 months.  Patient is agreeable to this plan and will call if any problems develop in the interim.   Signed: Gahel Safley [place] MD, [facility]    04/26/2017 8:25 AM

## 2017-04-28 ENCOUNTER — Encounter: Payer: Self-pay | Admitting: *Deleted

## 2017-04-28 ENCOUNTER — Ambulatory Visit: Payer: Medicare HMO | Admitting: Cardiology

## 2017-05-07 ENCOUNTER — Encounter (HOSPITAL_COMMUNITY): Payer: Self-pay

## 2017-05-07 ENCOUNTER — Other Ambulatory Visit: Payer: Self-pay

## 2017-05-07 ENCOUNTER — Emergency Department (HOSPITAL_COMMUNITY): Payer: Medicare HMO

## 2017-05-07 ENCOUNTER — Observation Stay (HOSPITAL_COMMUNITY)
Admission: EM | Admit: 2017-05-07 | Discharge: 2017-05-08 | Disposition: A | Discharge status: Home / Self Care | Payer: Medicare HMO | Attending: Cardiovascular Disease | Admitting: Cardiovascular Disease

## 2017-05-07 DIAGNOSIS — E7800 Pure hypercholesterolemia, unspecified: Secondary | ICD-10-CM | POA: Diagnosis not present

## 2017-05-07 DIAGNOSIS — I129 Hypertensive chronic kidney disease with stage 1 through stage 4 chronic kidney disease, or unspecified chronic kidney disease: Secondary | ICD-10-CM | POA: Diagnosis not present

## 2017-05-07 DIAGNOSIS — Z7984 Long term (current) use of oral hypoglycemic drugs: Secondary | ICD-10-CM | POA: Diagnosis not present

## 2017-05-07 DIAGNOSIS — R079 Chest pain, unspecified: Secondary | ICD-10-CM | POA: Diagnosis present

## 2017-05-07 DIAGNOSIS — Z87891 Personal history of nicotine dependence: Secondary | ICD-10-CM | POA: Insufficient documentation

## 2017-05-07 DIAGNOSIS — I119 Hypertensive heart disease without heart failure: Secondary | ICD-10-CM | POA: Insufficient documentation

## 2017-05-07 DIAGNOSIS — N189 Chronic kidney disease, unspecified: Secondary | ICD-10-CM | POA: Diagnosis not present

## 2017-05-07 DIAGNOSIS — Z7982 Long term (current) use of aspirin: Secondary | ICD-10-CM | POA: Diagnosis not present

## 2017-05-07 DIAGNOSIS — E1122 Type 2 diabetes mellitus with diabetic chronic kidney disease: Secondary | ICD-10-CM | POA: Diagnosis not present

## 2017-05-07 DIAGNOSIS — Z79899 Other long term (current) drug therapy: Secondary | ICD-10-CM | POA: Diagnosis not present

## 2017-05-07 DIAGNOSIS — Z794 Long term (current) use of insulin: Secondary | ICD-10-CM

## 2017-05-07 DIAGNOSIS — I25110 Atherosclerotic heart disease of native coronary artery with unstable angina pectoris: Principal | ICD-10-CM | POA: Insufficient documentation

## 2017-05-07 DIAGNOSIS — R0602 Shortness of breath: Secondary | ICD-10-CM | POA: Diagnosis not present

## 2017-05-07 DIAGNOSIS — I200 Unstable angina: Secondary | ICD-10-CM | POA: Diagnosis present

## 2017-05-07 DIAGNOSIS — E119 Type 2 diabetes mellitus without complications: Secondary | ICD-10-CM

## 2017-05-07 DIAGNOSIS — Z951 Presence of aortocoronary bypass graft: Secondary | ICD-10-CM | POA: Diagnosis not present

## 2017-05-07 HISTORY — DX: Unspecified osteoarthritis, unspecified site: M19.90

## 2017-05-07 LAB — CBC
HEMATOCRIT: 39 % (ref 39.0–52.0)
HEMATOCRIT: 37.6 % — AB (ref 39.0–52.0)
Hemoglobin: 12.9 g/dL — ABNORMAL LOW (ref 13.0–17.0)
Hemoglobin: 12.4 g/dL — ABNORMAL LOW (ref 13.0–17.0)
MCH: 27.1 pg (ref 26.0–34.0)
MCH: 27.1 pg (ref 26.0–34.0)
MCHC: 33.1 g/dL (ref 30.0–36.0)
MCHC: 33 g/dL (ref 30.0–36.0)
MCV: 81.9 fL (ref 78.0–100.0)
MCV: 82.1 fL (ref 78.0–100.0)
PLATELETS: 143 10*3/uL — AB (ref 150–400)
PLATELETS: 141 10*3/uL — AB (ref 150–400)
RBC: 4.76 MIL/uL (ref 4.22–5.81)
RBC: 4.58 MIL/uL (ref 4.22–5.81)
RDW: 17.1 % — AB (ref 11.5–15.5)
RDW: 17.2 % — AB (ref 11.5–15.5)
WBC: 5.4 10*3/uL (ref 4.0–10.5)
WBC: 4.7 10*3/uL (ref 4.0–10.5)

## 2017-05-07 LAB — BASIC METABOLIC PANEL
Anion gap: 6 (ref 5–15)
Anion gap: 8 (ref 5–15)
BUN: 13 mg/dL (ref 6–20)
BUN: 13 mg/dL (ref 6–20)
CALCIUM: 8.8 mg/dL — AB (ref 8.9–10.3)
CHLORIDE: 106 mmol/L (ref 101–111)
CO2: 28 mmol/L (ref 22–32)
CO2: 27 mmol/L (ref 22–32)
CREATININE: 1.28 mg/dL — AB (ref 0.61–1.24)
CREATININE: 1.2 mg/dL (ref 0.61–1.24)
Calcium: 9.1 mg/dL (ref 8.9–10.3)
Chloride: 104 mmol/L (ref 101–111)
GFR calc Af Amer: >60 mL/min (ref >60)
GFR calc Af Amer: >60 mL/min (ref >60)
GFR calc non Af Amer: 56 mL/min — ABNORMAL LOW (ref >60)
GLUCOSE: 148 mg/dL — AB (ref 65–99)
GLUCOSE: 215 mg/dL — AB (ref 65–99)
POTASSIUM: 3.7 mmol/L (ref 3.5–5.1)
Potassium: 3.2 mmol/L — ABNORMAL LOW (ref 3.5–5.1)
SODIUM: 140 mmol/L (ref 135–145)
Sodium: 139 mmol/L (ref 135–145)

## 2017-05-07 LAB — I-STAT TROPONIN, ED
TROPONIN I, POC: 0 ng/mL (ref 0.00–0.08)
Troponin i, poc: 0.01 ng/mL (ref 0.00–0.08)
Troponin i, poc: 0 ng/mL (ref 0.00–0.08)
Troponin i, poc: 0 ng/mL (ref 0.00–0.08)

## 2017-05-07 MED ORDER — ASPIRIN 325 MG PO TABS
325.0000 mg | ORAL_TABLET | Freq: Once | ORAL | Status: AC
  Administered 2017-05-07: 325 mg via ORAL
  Filled 2017-05-07: qty 1

## 2017-05-07 MED ORDER — TRIAMCINOLONE ACETONIDE 0.5 % EX CREA
TOPICAL_CREAM | Freq: Two times a day (BID) | CUTANEOUS | Status: DC
  Filled 2017-05-07: qty 15

## 2017-05-07 MED ORDER — AMLODIPINE BESYLATE 2.5 MG PO TABS
2.5000 mg | ORAL_TABLET | Freq: Every day | ORAL | Status: DC
  Administered 2017-05-08: 2.5 mg via ORAL
  Filled 2017-05-07 (×2): qty 1

## 2017-05-07 MED ORDER — ATORVASTATIN CALCIUM 40 MG PO TABS
40.0000 mg | ORAL_TABLET | Freq: Every day | ORAL | Status: DC
  Administered 2017-05-07 – 2017-05-08 (×2): 40 mg via ORAL
  Filled 2017-05-07 (×2): qty 1

## 2017-05-07 MED ORDER — ADULT MULTIVITAMIN W/MINERALS CH
1.0000 | ORAL_TABLET | Freq: Every day | ORAL | Status: DC
  Administered 2017-05-08: 1 via ORAL
  Filled 2017-05-07: qty 1

## 2017-05-07 MED ORDER — LOSARTAN POTASSIUM 50 MG PO TABS
100.0000 mg | ORAL_TABLET | Freq: Every day | ORAL | Status: DC
  Administered 2017-05-07 – 2017-05-08 (×2): 100 mg via ORAL
  Filled 2017-05-07 (×2): qty 2

## 2017-05-07 MED ORDER — NITROGLYCERIN 0.4 MG SL SUBL
0.4000 mg | SUBLINGUAL_TABLET | SUBLINGUAL | Status: DC | PRN
  Filled 2017-05-07: qty 1

## 2017-05-07 MED ORDER — NITROGLYCERIN 2 % TD OINT
1.0000 [in_us] | TOPICAL_OINTMENT | Freq: Once | TRANSDERMAL | Status: AC
  Administered 2017-05-07: 1 [in_us] via TOPICAL
  Filled 2017-05-07: qty 1

## 2017-05-07 MED ORDER — INSULIN DETEMIR 100 UNIT/ML ~~LOC~~ SOLN
10.0000 [IU] | Freq: Every day | SUBCUTANEOUS | Status: DC
  Administered 2017-05-07: 10 [IU] via SUBCUTANEOUS
  Filled 2017-05-07 (×2): qty 0.1

## 2017-05-07 MED ORDER — TRAMADOL HCL 50 MG PO TABS
50.0000 mg | ORAL_TABLET | Freq: Four times a day (QID) | ORAL | Status: DC | PRN
  Administered 2017-05-08: 50 mg via ORAL
  Filled 2017-05-07: qty 1

## 2017-05-07 MED ORDER — INSULIN ASPART 100 UNIT/ML ~~LOC~~ SOLN
0.0000 [IU] | Freq: Three times a day (TID) | SUBCUTANEOUS | Status: DC
  Administered 2017-05-08 (×2): 3 [IU] via SUBCUTANEOUS

## 2017-05-07 MED ORDER — PANTOPRAZOLE SODIUM 40 MG PO TBEC
40.0000 mg | DELAYED_RELEASE_TABLET | Freq: Two times a day (BID) | ORAL | Status: DC
  Administered 2017-05-07 – 2017-05-08 (×2): 40 mg via ORAL
  Filled 2017-05-07 (×2): qty 1

## 2017-05-07 MED ORDER — ASPIRIN EC 81 MG PO TBEC
81.0000 mg | DELAYED_RELEASE_TABLET | Freq: Every day | ORAL | Status: DC
  Administered 2017-05-07 – 2017-05-08 (×2): 81 mg via ORAL
  Filled 2017-05-07 (×2): qty 1

## 2017-05-07 MED ORDER — HYDROCHLOROTHIAZIDE 25 MG PO TABS
25.0000 mg | ORAL_TABLET | Freq: Every day | ORAL | Status: DC
  Administered 2017-05-07 – 2017-05-08 (×2): 25 mg via ORAL
  Filled 2017-05-07 (×2): qty 1

## 2017-05-07 MED ORDER — LOSARTAN POTASSIUM-HCTZ 100-25 MG PO TABS: 1.0000 | ORAL_TABLET | Freq: Every day | ORAL | Status: DC

## 2017-05-07 MED ORDER — INSULIN ASPART 100 UNIT/ML ~~LOC~~ SOLN
0.0000 [IU] | Freq: Every day | SUBCUTANEOUS | Status: DC
  Administered 2017-05-07: 2 [IU] via SUBCUTANEOUS
  Filled 2017-05-07: qty 1

## 2017-05-07 MED ORDER — METOPROLOL TARTRATE 25 MG PO TABS
25.0000 mg | ORAL_TABLET | Freq: Two times a day (BID) | ORAL | Status: DC
  Administered 2017-05-07 – 2017-05-08 (×2): 25 mg via ORAL
  Filled 2017-05-07 (×2): qty 1

## 2017-05-07 MED ORDER — PRASUGREL HCL 10 MG PO TABS
10.0000 mg | ORAL_TABLET | Freq: Every day | ORAL | Status: DC
  Administered 2017-05-07 – 2017-05-08 (×2): 10 mg via ORAL
  Filled 2017-05-07 (×2): qty 1

## 2017-05-07 NOTE — ED Provider Notes (Signed)
Antonio [name] DEPT Provider Note   CSN: [id number] Arrival date & time: 05/07/17  1328     History   Chief Complaint Chief Complaint  Patient presents with  . Chest Pain    HPI Antonio [name] is a 71 y.o. male hx of CAD s/p CABG, HTN, HL, DM, here with chest pain. Patient states that he had constant chest pain for the last 4 days. Occasionally it gets worse and radiate down the left arm and up the neck. It is associated with shortness of breath. Today, pain is more persistent so took 3 nitros and still has 4/10 pain so came to the ED for evaluation. Has hx of CABG, no stents. Had cath several years ago that showed multi vessel disease.   The history is provided by the patient.    Past Medical History:  Diagnosis Date  . Alcohol abuse, in remission   . Bleeding per rectum    "related to prostate cancer and hemorrhoids" (09/03/2012)  . BPH (benign prostatic hyperplasia)   . CAD (coronary artery disease)    a. s/p CABG 1996  b. LHC in 11/2013 w/ patent grafts. c. 12/19/14 re-look cath with patent grafts and good LVF  . Erectile dysfunction   . GERD (gastroesophageal reflux disease)   . History of lower GI bleeding   . HLD (hyperlipidemia)   . HTN (hypertension)   . Prostate cancer ([place])    a. s/p radiation   . Type II diabetes mellitus [facility])     Patient Active Problem List   Diagnosis Date Noted  . Chest pain 08/09/2015  . Hypokalemia 08/09/2015  . Dyslipidemia 08/09/2015  . Chronic kidney disease 08/09/2015  . CAD (coronary artery disease)   . History of lower GI bleeding   . Erectile dysfunction   . Skin lesion 05/28/2014  . 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 Prostate cancer ([place]) 01/28/2014  . Unstable angina ([place]) 09/03/2012  . Lower GI bleeding 02/12/2012  . BRBPR (bright red blood per rectum) 01/03/2012  . Diabetes ([place]) 12/17/2010  . HYPERCHOLESTEROLEMIA 12/17/2010  . 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 Essential hypertension 12/17/2010  .  BENIGN PROSTATIC HYPERTROPHY, WITH OBSTRUCTION 12/17/2010  . BPH (benign prostatic hyperplasia) 12/17/2010    Past Surgical History:  Procedure Laterality Date  . CARDIAC CATHETERIZATION  01/27/2009   ef 60%  . CARDIAC CATHETERIZATION  08/25/2012   Severe 3v obstructive CAD, continued graft patency (SVG-D,  SVG-OM1-OM2, SVG-PDA, LIMA-LAD). area of diffuse dz in the distal LCx (up to 90%) unamenable to PCI  . CERVICAL DISC SURGERY  ? 1990's   "went in on the side" (09/03/2012)  . CORONARY ARTERY BYPASS GRAFT  1996   LIMA GRAFT TO THE LAD, SAPHENOUS VEIN GRAFT SEQUENTIALLY TO THE FIRST AND SECOND OBTUSE MARGINAL VESSELS, SAPHENOUS VEIN GRAFT TO THE DIAGONAL, AND SAPHENOUS VEIN GRAFT TO THE DISTAL RIGHT CORONARY   . ESOPHAGOGASTRODUODENOSCOPY Left 11/19/2013   Procedure: ESOPHAGOGASTRODUODENOSCOPY (EGD);  Surgeon: Arta Silence, MD;  Location: [facility] ENDOSCOPY;  Service: Endoscopy;  Laterality: Left;  . FOOT SURGERY  1980's   LEFT, "shot a nail gun thru it" (09/03/2012)  . LACERATION REPAIR  1980's   LEFT HAND  . LEFT HEART CATHETERIZATION WITH CORONARY ANGIOGRAM N/A 09/04/2012   Procedure: LEFT HEART CATHETERIZATION WITH CORONARY ANGIOGRAM;  Surgeon: Peter M Martinique, MD;  Location: [facility] CATH LAB;  Service: Cardiovascular;  Laterality: N/A;  . LEFT HEART CATHETERIZATION WITH CORONARY/GRAFT ANGIOGRAM N/A 11/22/2013   Procedure: LEFT HEART CATHETERIZATION WITH  Beatrix Fetters;  Surgeon: Jettie Booze, MD;  [facility] LAB;  Service: Cardiovascular;  Laterality: N/A;  . LEFT HEART CATHETERIZATION WITH CORONARY/GRAFT ANGIOGRAM N/A 12/19/2014   Procedure: LEFT HEART CATHETERIZATION WITH Beatrix Fetters;  Surgeon: Peter M Martinique, MD;  Location: [facility] CATH LAB;  Service: Cardiovascular;  Laterality: N/A;       Home Medications    Prior to Admission medications   Medication Sig Start Date End Date Taking? Authorizing Provider  aspirin EC 81 MG tablet Take 81 mg by mouth daily.    Yes [provider]  atorvastatin (LIPITOR) 40 MG tablet Take 1 tablet (40 mg total) by mouth daily. 12/14/15  Yes Burtis Junes, NP  betamethasone dipropionate (DIPROLENE) 0.05 % cream Apply 0.05 % topically daily.  11/26/14  Yes [provider]  EFFIENT 10 MG TABS tablet take 1 tablet by mouth daily Patient taking differently: take 10MG  by mouth daily 03/26/17  Yes Martinique, Peter M, MD  isosorbide mononitrate (IMDUR) 60 MG 24 hr tablet TAKE 1 TABLET BY MOUTH DAILY Patient taking differently: TAKE 60MG  BY MOUTH DAILY 04/08/17  Yes Burtis Junes, NP  LEVEMIR FLEXTOUCH 100 UNIT/ML Pen INJECT 20 UNITS SUBCUTANEOUSLY AS DIRECTED 01/15/16  Yes [provider]  losartan-hydrochlorothiazide (HYZAAR) 100-25 MG per tablet Take 1 tablet by mouth daily.  11/19/14  Yes [provider]  metoprolol tartrate (LOPRESSOR) 25 MG tablet Take 1 tablet (25 mg total) by mouth 2 (two) times daily. KEEP OV. 02/10/17  Yes Martin[place], Peter M, MD  Multiple Vitamin (MULTIVITAMIN WITH MINERALS) TABS Take 1 tablet by mouth daily.   Yes [provider]  nitroGLYCERIN (NITROSTAT) 0.4 MG SL tablet place 1 tablet UNDER THE TONGUE every 5 minutes FOR 3 DOSES AS NEEDED FOR CHEST PAIN 04/17/17  Yes Martin[place], Peter M, MD  pantoprazole (PROTONIX) 40 MG tablet Take 1 tablet (40 mg total) by mouth 2 (two) times daily. 01/28/14  Yes Biagio Borg, MD  simvastatin (ZOCOR) 40 MG tablet Take 40 mg by mouth daily. 02/12/17  Yes [provider]  SitaGLIPtin-MetFORMIN HCl [phone] MG TB24 Take 1 tablet by mouth once. 12/22/14  Yes Eileen Stanford, PA-C  traMADol (ULTRAM) 50 MG tablet Take 1 tablet (50 mg total) by mouth every 6 (six) hours as needed for moderate pain. 03/28/15  Yes Martinique, Peter M, MD  amLODipine (NORVASC) 2.5 MG tablet Take 1 tablet (2.5 mg total) by mouth daily. Patient not taking: Reported on 05/07/2017 08/09/15   Velvet Bathe, MD  Insulin Pen Needle (BD PEN NEEDLE NANO U/F) 32G X 4  MM MISC 1 pen by Does not apply route daily. Patient not taking: Reported on 05/07/2017 11/11/13   Kyra Leyland, PA-C  potassium chloride SA (K-DUR,KLOR-CON) 20 MEQ tablet take 1 tablet by mouth twice a day Patient not taking: Reported on 05/07/2017 08/15/16   Martinique, Peter M, MD    Family History Family History  Problem Relation Age of Onset  . Cancer Mother        breast  . Hypertension Mother   . Hypertension Father   . Diabetes Other        2 siblings, 1 of whom is deceased  . Hypertension Other   . Cancer Other        Lung Cancer  . Cancer Other        FH of Prostate Cancer 1st degree relative  . CAD Brother     Social History Social History  Substance Use Topics  . Smoking status: Former Smoker    Packs/day: 1.00    Years: 37.00    Types: Cigarettes    Quit date: 10/14/2002  . Smokeless tobacco: Never Used  . Alcohol use 0.0 oz/week     Comment: 6 pack beer per week     Allergies   Demerol and Meperidine and related   Review of Systems Review of Systems  Cardiovascular: Positive for chest pain.  All other systems reviewed and are negative.    Physical Exam Updated Vital Signs BP 130/85   Pulse 72   Temp 97.9 F (36.6 C) (Oral)   Resp 15   Wt 79.4 kg (175 lb)   SpO2 98%   BMI 27.00 kg/m   Physical Exam  Constitutional: He is oriented to person, place, and time. He appears well-developed.  HENT:  Head: Normocephalic.  Mouth/Throat: Oropharynx is clear and moist.  Eyes: Pupils are equal, round, and reactive to light. Conjunctivae and EOM are normal.  Neck: Normal range of motion. Neck supple.  Cardiovascular: Normal rate, regular rhythm and normal heart sounds.   Pulmonary/Chest: Effort normal and breath sounds normal. No respiratory distress. He has no wheezes.  Abdominal: Soft. Bowel sounds are normal. He exhibits no distension. There is no tenderness. There is no guarding.  Musculoskeletal: Normal range of motion. He exhibits no edema or  deformity.  Neurological: He is alert and oriented to person, place, and time. No cranial nerve deficit. Coordination normal.  Skin: Skin is warm.  Psychiatric: He has a normal mood and affect.  Nursing note and vitals reviewed.    ED Treatments / Results  Labs (all labs ordered are listed, but only abnormal results are displayed) Labs Reviewed  BASIC METABOLIC PANEL - Abnormal; Notable for the following:       Result Value   Glucose, Bld 148 (*)    Creatinine, Ser 1.28 (*)    GFR calc non Af Amer 56 (*)    All other components within normal limits  CBC - Abnormal; Notable for the following:    Hemoglobin 12.9 (*)    RDW 17.1 (*)    Platelets 143 (*)    All other components within normal limits  I-STAT TROPONIN, ED  I-STAT TROPONIN, ED    EKG  EKG Interpretation  Date/Time:  Wednesday May 07 2017 13:35:35 EDT Ventricular Rate:  78 PR Interval:  156 QRS Duration: 84 QT Interval:  370 QTC Calculation: 421 R Axis:   11 Text Interpretation:  Normal sinus rhythm Minimal voltage criteria for LVH, may be normal variant Inferior infarct , age undetermined T wave abnormality, consider anterolateral ischemia Abnormal ECG No significant change since last tracing Confirmed by Wandra Arthurs [phone]) on 05/07/2017 5:48:39 PM       Radiology Dg Chest 2 View  Result Date: 05/07/2017 CLINICAL DATA:  Days of chest pain radiating into the arm and left aspect of the face associated with nausea dizziness and shortness of breath. History of coronary artery disease with previous CABG. Former smoker. EXAM: CHEST  2 VIEW COMPARISON:  Chest x-ray of August 09, 2015 FINDINGS: The lungs are adequately inflated. The interstitial markings are mildly prominent though stable. The heart and pulmonary vascularity are normal. The patient has undergone previous CABG. There is no pleural effusion. The bony thorax is unremarkable. IMPRESSION: There is no CHF, pneumonia, nor other acute cardiopulmonary  abnormality. Electronically Signed   By: Adaleah Forget  Martinique M.D.   On: 05/07/2017 14:24  Procedures Procedures (including critical care time)  Medications Ordered in ED Medications  nitroGLYCERIN (NITROSTAT) SL tablet 0.4 mg (not administered)  aspirin tablet 325 mg (325 mg Oral Given 05/07/17 1831)     Initial Impression / Assessment and Plan / ED Course  I have reviewed the triage vital signs and the nursing notes.  Pertinent labs & imaging results that were available during my care of the patient were reviewed by me and considered in my medical decision making (see chart for details).     Antonio [name] is a 71 y.o. male here with chest pain. Hx of CAD s/p CABG. Still has 4/10 pain despite nitro. Concerned for unstable angina. Initial trop nl. Ordered ASA, nitro. Dr. Oval Linsey from cardiology saw patient and will admit.    Final Clinical Impressions(s) / ED Diagnoses   Final diagnoses:  None    New Prescriptions New Prescriptions   No medications on file     Drenda Freeze, MD 05/07/17 2014

## 2017-05-07 NOTE — H&P (Signed)
Patient ID: Antonio [name] MRN: [id number] DOB/AGE: Mar 22, 1946 71 y.o.  Admit date: 05/07/2017 Primary Physician   [name], MD  Primary Cardiologist   Peter Martinique, MD Chief Complaint    chest pain   HPI: [name] is a 71 year old man with CAD status post CABG, hypertension, hyperlipidemia, and prior GI bleed who presented to the emergency department with chest pain.  He reports 4 days of angina at rest.  The pain is 4/10 in severity and substernal.  There is associated shortness of breath and mild nausea.  His symptoms do not change with exertion.  He doesn't get much formal exercise but is active.  He denies exertional symptoms.  He hasn't noted any lower extremity edema, orthopnea or PND.  He also denies palpitations, lightheadedness or dizziness.  He tried taking nitroglycerin x3 but didn't have any relief of his symptoms so he presented to the ED. In the ED his vitals have been stable.  Cardiac enzymes are negative x2.  EKG shows anterolateral TWI slightly worse than prior.   Antonio [name] had a [place] 11/2013 that showed an occluded LAD, severe OM disease and an occluded RCA. The LIMA to LAD was patent.   Saphenous vein graft to the diagonal, jump graft to the first and second marginals, and saphenous vein graft to the PDA were all patent.  His last echo in 2015 revealed LVEF 50-55% with grade 1 diastolic dysfunction.  He has chest pain intermittently and had a repeat cath in 2016 that was unchanged from prior.  He was started on Imdur, which helped his exertional angina.   Review of Systems:     Cardiac Review of Systems:        [Y] = yes [ ]  = no  Chest Pain [ Y  ]  Resting SOB [ Y  ] Exertional SOB  [Y  ]  Orthopnea [  ]   Pedal Edema [   ]    Palpitations [  ] Syncope  [  ]   Presyncope [   ]  General Review of Systems: [Y] = yes [  ]=no Constitional: recent weight change [  ]; anorexia [  ]; fatigue [  ]; nausea [  ]; night sweats [  ]; fever [  ]; or chills [  ];                                                                                                               Dental: poor dentition[  ];   Eye : blurred vision [  ]; diplopia [   ]; vision changes [  ];  Amaurosis fugax[  ]; Resp: cough [  ];  wheezing[  ];  hemoptysis[  ]; shortness of breath[  ]; paroxysmal nocturnal dyspnea[  ]; dyspnea on exertion[  ]; or orthopnea[  ];  GI:  gallstones[  ], vomiting[  ];  dysphagia[  ]; melena[  ];  hematochezia [  ]; heartburn[  ];   Hx of  Colonoscopy[  ];  GU: kidney stones [  ]; hematuria[  ];   dysuria [  ];  nocturia[  ];  history of     obstruction [  ];             Skin: rash, swelling[  ];, hair loss[  ];  peripheral edema[  ];  or itching[  ]; Musculosketetal: myalgias[  ];  joint swelling[  ];  joint erythema[  ];  joint pain[  ];  back pain[  ];  Heme/Lymph: bruising[  ];  bleeding[  ];  anemia[  ];  Neuro: TIA[  ];  headaches[  ];  stroke[  ];  vertigo[  ];  seizures[  ];   paresthesias[  ];  difficulty walking[  ];  Psych:depression[  ]; anxiety[  ];  Endocrine: diabetes[  ];  thyroid dysfunction[  ];  Immunizations: Flu [  ]; Pneumococcal[  ];  Other:  Past Medical History:  Diagnosis Date  . Alcohol abuse, in remission   . Bleeding per rectum    "related to prostate cancer and hemorrhoids" (09/03/2012)  . BPH (benign prostatic hyperplasia)   . CAD (coronary artery disease)    a. s/p CABG 1996  b. LHC in 11/2013 w/ patent grafts. c. 12/19/14 re-look cath with patent grafts and good LVF  . Erectile dysfunction   . GERD (gastroesophageal reflux disease)   . History of lower GI bleeding   . HLD (hyperlipidemia)   . HTN (hypertension)   . Prostate cancer ([place])    a. s/p radiation   . Type II diabetes mellitus ([place])      (Not in a hospital admission)   Allergies  Allergen Reactions  . Demerol Other (See Comments)    hallucinations  . Meperidine And Related Other (See Comments)    Pt not sure    Social History   Social History  .  Marital status: Married    Spouse name: N/A  . Number of children: 1  . Years of education: N/A   Occupational History  . [employer]    works 3rd shift   Social History Main Topics  . Smoking status: Former Smoker    Packs/day: 1.00    Years: 37.00    Types: Cigarettes    Quit date: 10/14/2002  . Smokeless tobacco: Never Used  . Alcohol use 0.0 oz/week     Comment: 6 pack beer per week  . Drug use: No  . Sexual activity: Yes   Other Topics Concern  . Not on file   Social History Narrative   Regular exercise-yes    Family History  Problem Relation Age of Onset  . Cancer Mother        breast  . Hypertension Mother   . Hypertension Father   . Diabetes Other        2 siblings, 1 of whom is deceased  . Hypertension Other   . Cancer Other        Lung Cancer  . Cancer Other        FH of Prostate Cancer 1st degree relative  . CAD Brother     PHYSICAL EXAM: Vitals:   05/07/17 1715 05/07/17 1730  BP: 116/74 133/86  Pulse: 69 72  Resp: 18 18  Temp:     General:  Well appearing. No respiratory difficulty HEENT: normal Neck: supple. no JVD. Carotids 2+ bilat; no bruits. No lymphadenopathy or thryomegaly appreciated. Cor: PMI nondisplaced. Regular rate & rhythm. No rubs, gallops or  murmurs. Lungs: clear Abdomen: soft, nontender, nondistended. No hepatosplenomegaly. No bruits or masses. Good bowel sounds. Extremities: no cyanosis, clubbing, rash, edema Neuro: alert & oriented x 3, cranial nerves grossly intact. moves all 4 extremities w/o difficulty. Affect pleasant.   Results for orders placed or performed during the hospital encounter of 05/07/17 (from the past 24 hour(s))  Basic metabolic panel     Status: Abnormal   Collection Time: 05/07/17  1:36 PM  Result Value Ref Range   Sodium 140 135 - 145 mmol/L   Potassium 3.7 3.5 - 5.1 mmol/L   Chloride 106 101 - 111 mmol/L   CO2 28 22 - 32 mmol/L   Glucose, Bld 148 (H) 65 - 99 mg/dL   BUN 13 6 - 20 mg/dL    Creatinine, Ser 1.28 (H) 0.61 - 1.24 mg/dL   Calcium 9.1 8.9 - 10.3 mg/dL   GFR calc non Af Amer 56 (L) >60 mL/min   GFR calc Af Amer >60 >60 mL/min   Anion gap 6 5 - 15  CBC     Status: Abnormal   Collection Time: 05/07/17  1:36 PM  Result Value Ref Range   WBC 5.4 4.0 - 10.5 K/uL   RBC 4.76 4.22 - 5.81 MIL/uL   Hemoglobin 12.9 (L) 13.0 - 17.0 g/dL   HCT 39.0 39.0 - 52.0 %   MCV 81.9 78.0 - 100.0 fL   MCH 27.1 26.0 - 34.0 pg   MCHC 33.1 30.0 - 36.0 g/dL   RDW 17.1 (H) 11.5 - 15.5 %   Platelets 143 (L) 150 - 400 K/uL  I-stat troponin, ED     Status: None   Collection Time: 05/07/17  1:44 PM  Result Value Ref Range   Troponin i, poc 0.01 0.00 - 0.08 ng/mL   Comment 3          I-stat troponin, ED     Status: None   Collection Time: 05/07/17  5:50 PM  Result Value Ref Range   Troponin i, poc 0.00 0.00 - 0.08 ng/mL   Comment 3           Dg Chest 2 View  Result Date: 05/07/2017 CLINICAL DATA:  Days of chest pain radiating into the arm and left aspect of the face associated with nausea dizziness and shortness of breath. History of coronary artery disease with previous CABG. Former smoker. EXAM: CHEST  2 VIEW COMPARISON:  Chest x-ray of August 09, 2015 FINDINGS: The lungs are adequately inflated. The interstitial markings are mildly prominent though stable. The heart and pulmonary vascularity are normal. The patient has undergone previous CABG. There is no pleural effusion. The bony thorax is unremarkable. IMPRESSION: There is no CHF, pneumonia, nor other acute cardiopulmonary abnormality. Electronically Signed   By: David  Martinique M.D.   On: 05/07/2017 14:24    ECG: Sinus rhythm. Rate 78 bpm.  Anterolateral TWI slightly more prominent that prior.  Prior inferior infarct.   Echo 11/18/13: Study Conclusions  - Left ventricle: The cavity size was normal. There was mild concentric hypertrophy. Systolic function was normal. The estimated ejection fraction was in the range of 50%  to 55%. Wall motion was normal; there were no regional wall motion abnormalities. Doppler parameters are consistent with abnormal left ventricular relaxation (grade 1 diastolic dysfunction). - Mitral valve: Mild regurgitation. - Left atrium: The atrium was mildly dilated. Volume: 70.65ml (S). - Pulmonary arteries: Systolic pressure was mildly increased.  ASSESSMENT/PLAN:  # CAD s/p CABG:  #  Unstable angina: Antonio [name] presents with chest pain x4 days.  Symptoms are somewhat atypical.  He had LHC in 2015 and 2016 that showed patent grafts.  Therefore, we will plan for [facility] tomorrow unless his last set of cardiac enzymes is elevated.  Continue aspirin, prasugrel, metoprolol, atorvastatin.  Start nitroglycerin paste.  He may benefit from a higher dose of Imdur.   # Hypertension:  BP controlled.  Continue losartan, HCTZ, metoprolol, and amlodipine.   # Hyperlipidemia:  Check fasting lipids. Continue atorvastaitin. LDL goal is <70.   # Diabetes: Continue long acting insulin and hold oral agents.  AC/HS accuchecks and SSI.    Signed: Tally Mckinnon C. Oval Linsey, MD, [facility]  05/07/2017, 6:16 PM

## 2017-05-07 NOTE — ED Triage Notes (Signed)
Pt presents to the ed with complaints of chest pain that is in his left chest going down his left arm and into his neck that started 2 days ago, pt describes pain as an aching feeling, denies any other symptoms.

## 2017-05-08 ENCOUNTER — Encounter (HOSPITAL_COMMUNITY): Payer: Self-pay | Admitting: *Deleted

## 2017-05-08 ENCOUNTER — Inpatient Hospital Stay (HOSPITAL_BASED_OUTPATIENT_CLINIC_OR_DEPARTMENT_OTHER): Payer: Medicare HMO

## 2017-05-08 DIAGNOSIS — Z951 Presence of aortocoronary bypass graft: Secondary | ICD-10-CM | POA: Diagnosis not present

## 2017-05-08 DIAGNOSIS — R079 Chest pain, unspecified: Secondary | ICD-10-CM | POA: Diagnosis not present

## 2017-05-08 DIAGNOSIS — I119 Hypertensive heart disease without heart failure: Secondary | ICD-10-CM | POA: Diagnosis not present

## 2017-05-08 DIAGNOSIS — I200 Unstable angina: Secondary | ICD-10-CM | POA: Diagnosis not present

## 2017-05-08 LAB — NM MYOCAR MULTI W/SPECT W/WALL MOTION / EF
CHL CUP MPHR: 152 {beats}/min
CHL CUP RESTING HR STRESS: 68 {beats}/min
CSEPED: 5 min
CSEPHR: 61 %
CSEPPHR: 93 {beats}/min
Estimated workload: 1 METS
Exercise duration (sec): 32 s

## 2017-05-08 LAB — LIPID PANEL
CHOL/HDL RATIO: 5 ratio
Cholesterol: 186 mg/dL (ref 0–200)
HDL: 37 mg/dL — AB (ref >40)
LDL CALC: 120 mg/dL — AB (ref 0–99)
TRIGLYCERIDES: 144 mg/dL (ref <150)
VLDL: 29 mg/dL (ref 0–40)

## 2017-05-08 LAB — GLUCOSE, CAPILLARY
GLUCOSE-CAPILLARY: 217 mg/dL — AB (ref 65–99)
GLUCOSE-CAPILLARY: 186 mg/dL — AB (ref 65–99)
GLUCOSE-CAPILLARY: 191 mg/dL — AB (ref 65–99)
Glucose-Capillary: 160 mg/dL — ABNORMAL HIGH (ref 65–99)

## 2017-05-08 LAB — MRSA PCR SCREENING: MRSA by PCR: NEGATIVE

## 2017-05-08 MED ORDER — METFORMIN HCL 500 MG PO TABS
500.0000 mg | ORAL_TABLET | Freq: Two times a day (BID) | ORAL | Status: DC
  Administered 2017-05-08: 500 mg via ORAL
  Filled 2017-05-08: qty 1

## 2017-05-08 MED ORDER — TECHNETIUM TC 99M TETROFOSMIN IV KIT
30.0000 | PACK | Freq: Once | INTRAVENOUS | Status: AC | PRN
  Administered 2017-05-08: 30 via INTRAVENOUS

## 2017-05-08 MED ORDER — ACETAMINOPHEN 325 MG PO TABS
ORAL_TABLET | ORAL | Status: AC
  Filled 2017-05-08: qty 2

## 2017-05-08 MED ORDER — REGADENOSON 0.4 MG/5ML IV SOLN
0.4000 mg | Freq: Once | INTRAVENOUS | Status: AC
  Administered 2017-05-08: 0.4 mg via INTRAVENOUS
  Filled 2017-05-08: qty 5

## 2017-05-08 MED ORDER — AMLODIPINE BESYLATE 2.5 MG PO TABS: 2.5000 mg | ORAL_TABLET | Freq: Every day | ORAL | 3 refills | Status: DC

## 2017-05-08 MED ORDER — REGADENOSON 0.4 MG/5ML IV SOLN
INTRAVENOUS | Status: AC
  Filled 2017-05-08: qty 5

## 2017-05-08 MED ORDER — POTASSIUM CHLORIDE CRYS ER 20 MEQ PO TBCR
40.0000 meq | EXTENDED_RELEASE_TABLET | Freq: Once | ORAL | Status: AC
Start: 2017-05-08 — End: 2017-05-08
  Administered 2017-05-08: 40 meq via ORAL
  Filled 2017-05-08: qty 2

## 2017-05-08 MED ORDER — TECHNETIUM TC 99M TETROFOSMIN IV KIT
10.0000 | PACK | Freq: Once | INTRAVENOUS | Status: AC | PRN
  Administered 2017-05-08: 10 via INTRAVENOUS

## 2017-05-08 MED ORDER — LINAGLIPTIN 5 MG PO TABS
5.0000 mg | ORAL_TABLET | Freq: Every day | ORAL | Status: DC
  Administered 2017-05-08: 5 mg via ORAL
  Filled 2017-05-08: qty 1

## 2017-05-08 MED ORDER — HYDRALAZINE HCL 20 MG/ML IJ SOLN
10.0000 mg | Freq: Once | INTRAMUSCULAR | Status: AC
  Administered 2017-05-08: 10 mg via INTRAVENOUS
  Filled 2017-05-08: qty 1

## 2017-05-08 MED ORDER — ACETAMINOPHEN 325 MG PO TABS
650.0000 mg | ORAL_TABLET | Freq: Four times a day (QID) | ORAL | Status: DC | PRN
  Administered 2017-05-08: 650 mg via ORAL

## 2017-05-08 NOTE — Progress Notes (Signed)
Progress Note  Patient Name: Antonio [name] Date of Encounter: 05/08/2017  Primary Cardiologist: Dr Martinique  Patient Profile     71 y.o. male w/ hx CABG, hypertension, hyperlipidemia, and prior GI bleed was admitted 07/25 w/ CP x 4 days, SOB and nausea.   Subjective   CP was 3/10 overnight, got to 7/10 with Lexiscan. Worse with deep inspiration but no chest wall tenderness. ECG is unchanged  Inpatient Medications    Scheduled Meds: . amLODipine  2.5 mg Oral Daily  . aspirin EC  81 mg Oral Daily  . atorvastatin  40 mg Oral q1800  . hydrochlorothiazide  25 mg Oral Daily  . insulin aspart  0-15 Units Subcutaneous TID WC  . insulin aspart  0-5 Units Subcutaneous QHS  . insulin detemir  10 Units Subcutaneous Q2200  . losartan  100 mg Oral Daily  . metoprolol tartrate  25 mg Oral BID  . multivitamin with minerals  1 tablet Oral Daily  . pantoprazole  40 mg Oral BID  . prasugrel  10 mg Oral Daily  . regadenoson      . triamcinolone cream   Topical BID   Continuous Infusions:  PRN Meds: nitroGLYCERIN, technetium tetrofosmin, traMADol   Vital Signs    Vitals:   05/08/17 0735 05/08/17 0909 05/08/17 0957 05/08/17 0959  BP: (!) 154/93 (!) 177/105 (!) 138/95 (!) 140/99  Pulse: 69     Resp: 16     Temp: 97.9 F (36.6 C)     TempSrc: Oral     SpO2: 97%     Weight:      Height:       No intake or output data in the 24 hours ending 05/08/17 1002 Filed Weights   05/07/17 1337 05/08/17 0129  Weight: 175 lb (79.4 kg) 184 lb 6.4 oz (83.6 kg)    Telemetry    SR, seen in nuc med - Personally Reviewed  ECG    07/25, SR, lateral T waves slightly different from 2016 - Personally Reviewed  Physical Exam   General: Well developed, well nourished, male appearing in no acute distress. Head: Normocephalic, atraumatic.  Neck: Supple without bruits, JVD not elevated. Lungs:  Resp regular and unlabored, CTA. Heart: RRR, S1, S2, no S3, S4, or murmur; no rub. Abdomen: Soft,  non-tender, non-distended with normoactive bowel sounds. No hepatomegaly. No rebound/guarding. No obvious abdominal masses. Extremities: No clubbing, cyanosis, no edema. Distal pedal pulses are 2+ bilaterally. Neuro: Alert and oriented X 3. Moves all extremities spontaneously. Psych: Normal affect.  Labs    Hematology Recent Labs Lab 05/07/17 1336 05/07/17 1933  WBC 5.4 4.7  RBC 4.76 4.58  HGB 12.9* 12.4*  HCT 39.0 37.6*  MCV 81.9 82.1  MCH 27.1 27.1  MCHC 33.1 33.0  RDW 17.1* 17.2*  PLT 143* 141*    Chemistry Recent Labs Lab 05/07/17 1336 05/07/17 1933  NA 140 139  K 3.7 3.2*  CL 106 104  CO2 28 27  GLUCOSE 148* 215*  BUN 13 13  CREATININE 1.28* 1.20  CALCIUM 9.1 8.8*  GFRNONAA 56* >60  GFRAA >60 >60  ANIONGAP 6 8     Cardiac Enzymes  Recent Labs Lab 05/07/17 1344 05/07/17 1750 05/07/17 1937 05/07/17 2213  TROPIPOC 0.01 0.00 0.00 0.00     Radiology    Dg Chest 2 View  Result Date: 05/07/2017 CLINICAL DATA:  Days of chest pain radiating into the arm and left aspect of the face associated with  nausea dizziness and shortness of breath. History of coronary artery disease with previous CABG. Former smoker. EXAM: CHEST  2 VIEW COMPARISON:  Chest x-ray of August 09, 2015 FINDINGS: The lungs are adequately inflated. The interstitial markings are mildly prominent though stable. The heart and pulmonary vascularity are normal. The patient has undergone previous CABG. There is no pleural effusion. The bony thorax is unremarkable. IMPRESSION: There is no CHF, pneumonia, nor other acute cardiopulmonary abnormality. Electronically Signed   By: David  Martinique M.D.   On: 05/07/2017 14:24     Cardiac Studies   MV pending  Patient Profile     71 y.o. male w/ hx CABG, hypertension, hyperlipidemia, and prior GI bleed was admitted 07/25 w/ CP x 4 days, SOB and nausea.   Assessment & Plan   Active Problems: 1.  Unstable angina (HCC) - sx have been continuous x >4 days  without significant ECG changes or enzyme elevations. - do Lexiscan today and f/u on results - rx with Tylenol and prn nitro  2.  Hypokalemia - K+ 3.2 on admission - Pt was on Kdur 20 meq bid pta - HTCZ 25 mg qd held on admission - MD advise on increasing home [place] to 20 meq tid  Plan: d/c if MV negative   Jonetta Speak , PA-C 10:02 AM 05/08/2017 Pager: [phone]

## 2017-05-08 NOTE — Plan of Care (Signed)
Problem: Pain Managment: Goal: General experience of comfort will improve Outcome: Progressing Assessed pain with patient and used nonpharmacologic and pharmacologic interventions for management.

## 2017-05-08 NOTE — Discharge Instructions (Signed)
Angina Pectoris Angina pectoris is a very bad feeling in the chest, neck, or arm. Your doctor may call it angina. There are four types of angina. Angina is caused by a lack of blood in the middle and thickest layer of the heart wall (myocardium). Angina may feel like a crushing or squeezing pain in the chest. It may feel like tightness or heavy pressure in the chest. Some people say it feels like gas, heartburn, or indigestion. Some people have symptoms other than pain. These include:  Shortness of breath.  Cold sweats.  Feeling sick to your stomach (nausea).  Feeling light-headed.  Many women have chest discomfort and some of the other symptoms. However, women often have different symptoms, such as:  Feeling tired (fatigue).  Feeling nervous for no reason.  Feeling weak for no reason.  Dizziness or fainting.  Women may have angina without any symptoms. Follow these instructions at home:  Take medicines only as told by your doctor.  Take care of other health issues as told by your doctor. These include: ? High blood pressure (hypertension). ? Diabetes.  Follow a heart-healthy diet. Your doctor can help you to choose healthy food options and make changes.  Talk to your doctor to learn more about healthy cooking methods and use them. These include: ? Roasting. ? Grilling. ? Broiling. ? Baking. ? Poaching. ? Steaming. ? Stir-frying.  Follow an exercise program approved by your doctor.  Keep a healthy weight. Lose weight as told by your doctor.  Rest when you are tired.  Learn to manage stress.  Do not use any tobacco, such as cigarettes, chewing tobacco, or electronic cigarettes. If you need help quitting, ask your doctor.  If you drink alcohol, and your doctor says it is okay, limit yourself to no more than 1 drink per day. One drink equals 12 ounces of beer, 5 ounces of wine, or 1 ounces of hard liquor.  Stop illegal drug use.  Keep all follow-up visits as told  by your doctor. This is important. Do not take these medicines unless your doctor says that you can:  Nonsteroidal anti-inflammatory drugs (NSAIDs). These include: ? Ibuprofen. ? Naproxen. ? Celecoxib.  Vitamin supplements that have vitamin A, vitamin E, or both.  Hormone therapy that contains estrogen with or without progestin.  Get help right away if:  You have pain in your chest, neck, arm, jaw, stomach, or back that: ? Lasts more than a few minutes. ? Comes back. ? Does not get better after you take medicine under your tongue (sublingual nitroglycerin).  You have any of these symptoms for no reason: ? Gas, heartburn, or indigestion. ? Sweating a lot. ? Shortness of breath or trouble breathing. ? Feeling sick to your stomach or throwing up. ? Feeling more tired than usual. ? Feeling nervous or worrying more than usual. ? Feeling weak. ? Diarrhea.  You are suddenly dizzy or light-headed.  You faint or pass out. These symptoms may be an emergency. Do not wait to see if the symptoms will go away. Get medical help right away. Call your local emergency services (911 in the [place] Do not drive yourself to the hospital. This information is not intended to replace advice given to you by your health care provider. Make sure you discuss any questions you have with your health care provider. Document Released: 03/18/2008 Document Revised: 03/07/2016 Document Reviewed: 02/01/2014 Elsevier Interactive Patient Education  2017 [employer]  

## 2017-05-08 NOTE — Care Management Obs Status (Signed)
[place] NOTIFICATION   Patient Details  Name: Antonio [name] MRN: [id number] Date of Birth: [birth year]   Medicare Observation Status Notification Given:  Yes    Bethena Roys, RN 05/08/2017, 4:01 PM

## 2017-05-08 NOTE — Discharge Summary (Signed)
Discharge Summary    Patient ID: Antonio [name],  MRN: [id number], DOB/AGE: [id number] y.o.  Admit date: 05/07/2017 Discharge date: 05/08/2017  Primary Care Provider: Lorene [name] Primary Cardiologist: Dr Antonio [name]  Discharge Diagnoses    Active Problems:   Unstable angina Antonio [name])   Allergies Allergies  Allergen Reactions  . Demerol Other (See Comments)    hallucinations  . Meperidine And Related Other (See Comments)    Pt not sure    Diagnostic Studies/Procedures    Dg Chest 2 View  Result Date: 05/07/2017 CLINICAL DATA:  Days of chest pain radiating into the arm and left aspect of the face associated with nausea dizziness and shortness of breath. History of coronary artery disease with previous CABG. Former smoker. EXAM: CHEST  2 VIEW COMPARISON:  Chest x-ray of August 09, 2015 FINDINGS: The lungs are adequately inflated. The interstitial markings are mildly prominent though stable. The heart and pulmonary vascularity are normal. The patient has undergone previous CABG. There is no pleural effusion. The bony thorax is unremarkable. IMPRESSION: There is no CHF, pneumonia, nor other acute cardiopulmonary abnormality. Electronically Signed   By: Antonio  Antonio [name] [name].D.   On: 05/07/2017 14:24   Nm Myocar Multi W/spect W/wall Motion / Ef  Result Date: 05/08/2017  There was no ST segment deviation noted during stress.  No T wave inversion was noted during stress.  Defect 1: There is a medium defect of moderate severity present in the basal inferior and mid inferior location.  The left ventricular ejection fraction is normal (55-65%).  Findings consistent with prior myocardial infarction.  Nuclear stress EF: 57%.     _____________   History of Present Illness     71 y.o. male w/ hx CABG, hypertension, hyperlipidemia, and prior GI bleed was admitted 07/25 w/ CP x 4 days, SOB and nausea.   Hospital Course     Consultants: none   Cardiac enzymes were negative for  MI Lexiscan MV was performed, results above. With his history, the inferior infarct is not felt acute and there was not ischemia. No further cardiac workup is indicated at this time.  Pt BP elevated in the hospital but he reports good readings at home. He is to track his BP at home and bring to a follow up appointment with Dr. Martinique or Antonio [name] within a week. May need to change metoprolol to carvedilol or increase amlodipine.   His potassium was 3.2 on admission and was supplemented. He takes HCTZ 25 mg qd and is on Kdur 20 meq bid. He will be recheck at follow up and may need a higher daily dose of Kdur.   His home med list includes both atorvastatin and simvastatin. Pt states he is taking simvastatin, will d/c the atorva  Dr Antonio [name] evaluated Antonio [name] and once his BP improved, no further cardiac workup is indicated. He is considered stable for discharge, to follow up as an outpatient. _____________  Discharge Vitals Blood pressure (!) 183/107, pulse 63, temperature 98 F (36.7 C), temperature source Oral, resp. rate 18, height 5\' 7"  (1.702 [name]), weight 184 lb 6.4 oz (83.6 kg), SpO2 97 %.  Filed Weights   05/07/17 1337 05/08/17 0129  Weight: 175 lb (79.4 kg) 184 lb 6.4 oz (83.6 kg)    Labs & Radiologic Studies    CBC  Recent Labs  05/07/17 1336 05/07/17 1933  WBC 5.4 4.7  HGB 12.9* 12.4*  HCT 39.0 37.6*  MCV 81.9 82.1  PLT 143*  401*   Basic Metabolic Panel  Recent Labs  05/07/17 1336 05/07/17 1933  NA 140 139  K 3.7 3.2*  CL 106 104  CO2 28 27  GLUCOSE 148* 215*  BUN 13 13  CREATININE 1.28* 1.20  CALCIUM 9.1 8.8*   Fasting Lipid Panel  Recent Labs  05/08/17 0214  CHOL 186  HDL 37*  LDLCALC 120*  TRIG 144  CHOLHDL 5.0   _____________  Dg Chest 2 View  Result Date: 05/07/2017 CLINICAL DATA:  Days of chest pain radiating into the arm and left aspect of the face associated with nausea dizziness and shortness of breath. History of coronary artery disease with  previous CABG. Former smoker. EXAM: CHEST  2 VIEW COMPARISON:  Chest x-ray of August 09, 2015 FINDINGS: The lungs are adequately inflated. The interstitial markings are mildly prominent though stable. The heart and pulmonary vascularity are normal. The patient has undergone previous CABG. There is no pleural effusion. The bony thorax is unremarkable. IMPRESSION: There is no CHF, pneumonia, nor other acute cardiopulmonary abnormality. Electronically Signed   By: Antonio  Antonio [name] [name].D.   On: 05/07/2017 14:24   Nm Myocar Multi W/spect W/wall Motion / Ef  Result Date: 05/08/2017  There was no ST segment deviation noted during stress.  No T wave inversion was noted during stress.  Defect 1: There is a medium defect of moderate severity present in the basal inferior and mid inferior location.  The left ventricular ejection fraction is normal (55-65%).  Findings consistent with prior myocardial infarction.  Nuclear stress EF: 57%.    Disposition   Pt is being discharged home today in good condition.  Follow-up Plans & Appointments    Follow-up Information    Antonio [name], Peter M, MD Follow up.   Specialty:  Cardiology Why:  The office will call. Contact information: [address] [place] [address] [phone]          Discharge Instructions    Diet - low sodium heart healthy    Complete by:  As directed    Diet Carb Modified    Complete by:  As directed    Increase activity slowly    Complete by:  As directed       Discharge Medications   Current Discharge Medication List    CONTINUE these medications which have CHANGED   Details  amLODipine (NORVASC) 2.5 MG tablet Take 1 tablet (2.5 mg total) by mouth daily. Qty: 30 tablet, Refills: 3      CONTINUE these medications which have NOT CHANGED   Details  aspirin EC 81 MG tablet Take 81 mg by mouth daily.    atorvastatin (LIPITOR) 40 MG tablet Take 1 tablet (40 mg total) by mouth daily. Qty: 90 tablet, Refills:  0    betamethasone dipropionate (DIPROLENE) 0.05 % cream Apply 0.05 % topically daily.  Refills: 0    EFFIENT 10 MG TABS tablet take 1 tablet by mouth daily Qty: 30 tablet, Refills: 1    isosorbide mononitrate (IMDUR) 60 MG 24 hr tablet TAKE 1 TABLET BY MOUTH DAILY Qty: 30 tablet, Refills: 0    LEVEMIR FLEXTOUCH 100 UNIT/ML Pen INJECT 20 UNITS SUBCUTANEOUSLY AS DIRECTED Refills: 0    losartan-hydrochlorothiazide (HYZAAR) 100-25 MG per tablet Take 1 tablet by mouth daily.  Refills: 0    metoprolol tartrate (LOPRESSOR) 25 MG tablet Take 1 tablet (25 mg total) by mouth 2 (two) times daily. KEEP OV. Qty: 180 tablet, Refills: 1  Multiple Vitamin (MULTIVITAMIN WITH MINERALS) TABS Take 1 tablet by mouth daily.    nitroGLYCERIN (NITROSTAT) 0.4 MG SL tablet place 1 tablet UNDER THE TONGUE every 5 minutes FOR 3 DOSES AS NEEDED FOR CHEST PAIN Qty: 25 tablet, Refills: 3    pantoprazole (PROTONIX) 40 MG tablet Take 1 tablet (40 mg total) by mouth 2 (two) times daily. Qty: 60 tablet, Refills: 11    SitaGLIPtin-MetFORMIN HCl [phone] MG TB24 Take 1 tablet by mouth once. Qty: 30 tablet, Refills: 11    traMADol (ULTRAM) 50 MG tablet Take 1 tablet (50 mg total) by mouth every 6 (six) hours as needed for moderate pain. Qty: 60 tablet, Refills: 3    Insulin Pen Needle (BD PEN NEEDLE NANO U/F) 32G X 4 MM MISC 1 pen by Does not apply route daily. Qty: 30 each, Refills: 11    potassium chloride SA (K-DUR,KLOR-CON) 20 MEQ tablet take 1 tablet by mouth twice a day Qty: 180 tablet, Refills: 1      STOP taking these medications     simvastatin (ZOCOR) 40 MG tablet           Outstanding Labs/Studies   None  Duration of Discharge Encounter   Greater than 30 minutes including physician time.  Jonetta Speak NP 05/08/2017, 6:01 PM

## 2017-05-08 NOTE — ED Notes (Signed)
Approval received from bed control to transfer patient to 3W.

## 2017-05-08 NOTE — Plan of Care (Signed)
Problem: Education: Goal: Knowledge of [facility] Education information/materials will improve Outcome: Progressing Pt instructed on use of call light and phone, oriented to room and unit routine.  Pt aware of plan for him to have a stress test in am.  Problem: Pain Managment: Goal: General experience of comfort will improve Outcome: Progressing Pain controlled with prescribed meds.

## 2017-05-08 NOTE — ED Notes (Signed)
Contacted patient placement to confirm current assignment to 3W for stepdown as bed assignment has not changed since update of bed request. At this time patient may be able to still go to 3[facility]. Patient placement to advise this RN further on course of action.

## 2017-05-08 NOTE — Plan of Care (Signed)
Problem: Education: Goal: Knowledge of [facility] Education information/materials will improve Outcome: Progressing Discussed medications with patient.

## 2017-05-08 NOTE — ED Notes (Signed)
Received call from [facility] inquiring about patient's status. [facility] personnel advised this RN that she would contact patient placement and confirm bed assignment.

## 2017-06-20 ENCOUNTER — Other Ambulatory Visit: Payer: Self-pay | Admitting: *Deleted

## 2017-06-23 MED ORDER — PRASUGREL HCL 10 MG PO TABS
10.0000 mg | ORAL_TABLET | Freq: Every day | ORAL | 1 refills | Status: DC
Start: 2017-06-23 — End: 2018-07-06

## 2017-06-28 ENCOUNTER — Ambulatory Visit (HOSPITAL_COMMUNITY)
Admission: EM | Admit: 2017-06-28 | Discharge: 2017-06-28 | Disposition: A | Discharge status: Home / Self Care | Payer: Medicare HMO | Attending: Internal Medicine | Admitting: Internal Medicine

## 2017-06-28 ENCOUNTER — Encounter (HOSPITAL_COMMUNITY): Payer: Self-pay

## 2017-06-28 DIAGNOSIS — S161XXA Strain of muscle, fascia and tendon at neck level, initial encounter: Secondary | ICD-10-CM | POA: Diagnosis not present

## 2017-06-28 MED ORDER — NAPROXEN 375 MG PO TABS: 375.0000 mg | ORAL_TABLET | Freq: Two times a day (BID) | ORAL | 0 refills | Status: DC

## 2017-06-28 MED ORDER — METAXALONE 800 MG PO TABS: 800.0000 mg | ORAL_TABLET | Freq: Three times a day (TID) | ORAL | 0 refills | Status: DC
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
## 2017-06-29 ENCOUNTER — Other Ambulatory Visit: Payer: Self-pay | Admitting: Nurse Practitioner

## 2017-09-01 ENCOUNTER — Telehealth: Payer: Self-pay

## 2017-09-01 NOTE — Telephone Encounter (Signed)
Called patient left message on personal voice mail received notice from insurance effient not covered.After reviewing your chart you need appointment to see Dr.Jordan.Advised to call me back today to schedule appointment.

## 2018-06-11 ENCOUNTER — Telehealth: Payer: Self-pay | Admitting: Cardiology

## 2018-06-11 MED ORDER — METOPROLOL TARTRATE 25 MG PO TABS: 25.0000 mg | ORAL_TABLET | Freq: Two times a day (BID) | ORAL | 0 refills | Status: DC

## 2018-06-11 MED ORDER — ISOSORBIDE MONONITRATE ER 60 MG PO TB24: 60.0000 mg | ORAL_TABLET | Freq: Every day | ORAL | 0 refills | Status: DC

## 2018-06-11 NOTE — Telephone Encounter (Signed)
New Message         *STAT* If patient is at the pharmacy, call can be transferred to refill team.   1. Which medications need to be refilled? (please list name of each medication and dose if known) metoprolol tartrate (LOPRESSOR) 25 MG tablet  2. Which pharmacy/location (including street and city if local pharmacy) is medication to be sent to? [facility]  3. Do they need a 30 day or 90 day supply? 90    Patient has one more Rx he couldn't think of the name of it, however; he states he takes it when his heart is hurting

## 2018-06-11 NOTE — Telephone Encounter (Signed)
Spoke to patient - needed medication given to RN. E-SENT  TO PHARMACY . PATIENT AWARE .
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
## 2018-07-06 ENCOUNTER — Encounter: Payer: Self-pay | Admitting: Physician Assistant

## 2018-07-06 ENCOUNTER — Ambulatory Visit (INDEPENDENT_AMBULATORY_CARE_PROVIDER_SITE_OTHER): Payer: Medicare HMO | Admitting: Physician Assistant

## 2018-07-06 VITALS — BP 126/84 | HR 70 | Ht 67.0 in | Wt 174.8 lb

## 2018-07-06 DIAGNOSIS — Z951 Presence of aortocoronary bypass graft: Secondary | ICD-10-CM

## 2018-07-06 DIAGNOSIS — E7800 Pure hypercholesterolemia, unspecified: Secondary | ICD-10-CM | POA: Diagnosis not present

## 2018-07-06 DIAGNOSIS — I25810 Atherosclerosis of coronary artery bypass graft(s) without angina pectoris: Secondary | ICD-10-CM

## 2018-07-06 DIAGNOSIS — I119 Hypertensive heart disease without heart failure: Secondary | ICD-10-CM

## 2018-07-06 MED ORDER — ISOSORBIDE MONONITRATE ER 60 MG PO TB24: 60.0000 mg | ORAL_TABLET | Freq: Every day | ORAL | 3 refills | Status: DC

## 2018-07-06 MED ORDER — METOPROLOL TARTRATE 25 MG PO TABS: 25.0000 mg | ORAL_TABLET | Freq: Two times a day (BID) | ORAL | 3 refills | Status: DC

## 2018-07-06 MED ORDER — ROSUVASTATIN CALCIUM 40 MG PO TABS: 40.0000 mg | ORAL_TABLET | Freq: Every day | ORAL | 3 refills | Status: DC

## 2018-07-06 MED ORDER — PRASUGREL HCL 10 MG PO TABS: 10.0000 mg | ORAL_TABLET | Freq: Every day | ORAL | 3 refills | Status: DC

## 2018-07-06 MED ORDER — LOSARTAN POTASSIUM-HCTZ 100-25 MG PO TABS: 1.0000 | ORAL_TABLET | Freq: Every day | ORAL | 3 refills | Status: DC

## 2018-07-06 NOTE — Patient Instructions (Signed)
Medication Instructions:  INCREASE- Crestor 40 mg daily  If you need a refill on your cardiac medications before your next appointment, please call your pharmacy.  Labwork: None Ordered   Testing/Procedures: None Ordered  Follow-Up: Your physician wants you to follow-up in: 1 Year. You should receive a reminder letter in the mail two months in advance. If you do not receive a letter, please call our office in [phone] to schedule your follow-up appointment.     Thank you for choosing [facility] at [facility]!!

## 2018-08-14 ENCOUNTER — Encounter (HOSPITAL_COMMUNITY): Payer: Self-pay

## 2018-08-14 ENCOUNTER — Emergency Department (HOSPITAL_COMMUNITY): Payer: Medicare HMO

## 2018-08-14 ENCOUNTER — Observation Stay (HOSPITAL_COMMUNITY)
Admission: EM | Admit: 2018-08-14 | Discharge: 2018-08-15 | Disposition: A | Discharge status: Home / Self Care | Payer: Medicare HMO | Attending: Internal Medicine | Admitting: Internal Medicine

## 2018-08-14 ENCOUNTER — Other Ambulatory Visit: Payer: Self-pay

## 2018-08-14 DIAGNOSIS — Z7982 Long term (current) use of aspirin: Secondary | ICD-10-CM | POA: Insufficient documentation

## 2018-08-14 DIAGNOSIS — M1990 Unspecified osteoarthritis, unspecified site: Secondary | ICD-10-CM | POA: Diagnosis not present

## 2018-08-14 DIAGNOSIS — R079 Chest pain, unspecified: Secondary | ICD-10-CM

## 2018-08-14 DIAGNOSIS — Z794 Long term (current) use of insulin: Secondary | ICD-10-CM | POA: Insufficient documentation

## 2018-08-14 DIAGNOSIS — Z803 Family history of malignant neoplasm of breast: Secondary | ICD-10-CM | POA: Insufficient documentation

## 2018-08-14 DIAGNOSIS — N189 Chronic kidney disease, unspecified: Secondary | ICD-10-CM | POA: Insufficient documentation

## 2018-08-14 DIAGNOSIS — N529 Male erectile dysfunction, unspecified: Secondary | ICD-10-CM | POA: Diagnosis not present

## 2018-08-14 DIAGNOSIS — E785 Hyperlipidemia, unspecified: Secondary | ICD-10-CM | POA: Insufficient documentation

## 2018-08-14 DIAGNOSIS — K219 Gastro-esophageal reflux disease without esophagitis: Secondary | ICD-10-CM | POA: Insufficient documentation

## 2018-08-14 DIAGNOSIS — Z885 Allergy status to narcotic agent status: Secondary | ICD-10-CM | POA: Diagnosis not present

## 2018-08-14 DIAGNOSIS — F1011 Alcohol abuse, in remission: Secondary | ICD-10-CM | POA: Diagnosis not present

## 2018-08-14 DIAGNOSIS — Z87891 Personal history of nicotine dependence: Secondary | ICD-10-CM | POA: Diagnosis not present

## 2018-08-14 DIAGNOSIS — I252 Old myocardial infarction: Secondary | ICD-10-CM | POA: Insufficient documentation

## 2018-08-14 DIAGNOSIS — Z8546 Personal history of malignant neoplasm of prostate: Secondary | ICD-10-CM | POA: Insufficient documentation

## 2018-08-14 DIAGNOSIS — R42 Dizziness and giddiness: Secondary | ICD-10-CM | POA: Diagnosis not present

## 2018-08-14 DIAGNOSIS — E119 Type 2 diabetes mellitus without complications: Secondary | ICD-10-CM

## 2018-08-14 DIAGNOSIS — Z9889 Other specified postprocedural states: Secondary | ICD-10-CM | POA: Insufficient documentation

## 2018-08-14 DIAGNOSIS — I6782 Cerebral ischemia: Secondary | ICD-10-CM | POA: Diagnosis not present

## 2018-08-14 DIAGNOSIS — E1122 Type 2 diabetes mellitus with diabetic chronic kidney disease: Secondary | ICD-10-CM | POA: Insufficient documentation

## 2018-08-14 DIAGNOSIS — Z923 Personal history of irradiation: Secondary | ICD-10-CM | POA: Diagnosis not present

## 2018-08-14 DIAGNOSIS — R519 Headache, unspecified: Secondary | ICD-10-CM

## 2018-08-14 DIAGNOSIS — R0789 Other chest pain: Secondary | ICD-10-CM | POA: Diagnosis present

## 2018-08-14 DIAGNOSIS — R51 Headache: Secondary | ICD-10-CM | POA: Insufficient documentation

## 2018-08-14 DIAGNOSIS — Z8249 Family history of ischemic heart disease and other diseases of the circulatory system: Secondary | ICD-10-CM | POA: Insufficient documentation

## 2018-08-14 DIAGNOSIS — Z79899 Other long term (current) drug therapy: Secondary | ICD-10-CM | POA: Insufficient documentation

## 2018-08-14 DIAGNOSIS — I1310 Hypertensive heart and chronic kidney disease without heart failure, with stage 1 through stage 4 chronic kidney disease, or unspecified chronic kidney disease: Secondary | ICD-10-CM | POA: Insufficient documentation

## 2018-08-14 DIAGNOSIS — I10 Essential (primary) hypertension: Secondary | ICD-10-CM | POA: Diagnosis present

## 2018-08-14 DIAGNOSIS — I2510 Atherosclerotic heart disease of native coronary artery without angina pectoris: Secondary | ICD-10-CM | POA: Diagnosis not present

## 2018-08-14 DIAGNOSIS — Z801 Family history of malignant neoplasm of trachea, bronchus and lung: Secondary | ICD-10-CM | POA: Insufficient documentation

## 2018-08-14 DIAGNOSIS — N400 Enlarged prostate without lower urinary tract symptoms: Secondary | ICD-10-CM | POA: Diagnosis not present

## 2018-08-14 DIAGNOSIS — Z66 Do not resuscitate: Secondary | ICD-10-CM | POA: Insufficient documentation

## 2018-08-14 DIAGNOSIS — G319 Degenerative disease of nervous system, unspecified: Secondary | ICD-10-CM | POA: Insufficient documentation

## 2018-08-14 DIAGNOSIS — Z8042 Family history of malignant neoplasm of prostate: Secondary | ICD-10-CM | POA: Insufficient documentation

## 2018-08-14 DIAGNOSIS — E1169 Type 2 diabetes mellitus with other specified complication: Secondary | ICD-10-CM | POA: Diagnosis present

## 2018-08-14 DIAGNOSIS — Z955 Presence of coronary angioplasty implant and graft: Secondary | ICD-10-CM | POA: Insufficient documentation

## 2018-08-14 DIAGNOSIS — Z951 Presence of aortocoronary bypass graft: Secondary | ICD-10-CM | POA: Insufficient documentation

## 2018-08-14 DIAGNOSIS — E1159 Type 2 diabetes mellitus with other circulatory complications: Secondary | ICD-10-CM

## 2018-08-14 DIAGNOSIS — I152 Hypertension secondary to endocrine disorders: Secondary | ICD-10-CM

## 2018-08-14 LAB — BASIC METABOLIC PANEL
ANION GAP: 6 (ref 5–15)
BUN: 9 mg/dL (ref 8–23)
CALCIUM: 9.1 mg/dL (ref 8.9–10.3)
CO2: 30 mmol/L (ref 22–32)
Chloride: 104 mmol/L (ref 98–111)
Creatinine, Ser: 1.44 mg/dL — ABNORMAL HIGH (ref 0.61–1.24)
GFR, EST AFRICAN AMERICAN: 56 mL/min — AB (ref >60)
GFR, EST NON AFRICAN AMERICAN: 48 mL/min — AB (ref >60)
Glucose, Bld: 150 mg/dL — ABNORMAL HIGH (ref 70–99)
Potassium: 3.7 mmol/L (ref 3.5–5.1)
Sodium: 140 mmol/L (ref 135–145)

## 2018-08-14 LAB — CBC
HCT: 44.9 % (ref 39.0–52.0)
Hemoglobin: 14.2 g/dL (ref 13.0–17.0)
MCH: 26.7 pg (ref 26.0–34.0)
MCHC: 31.6 g/dL (ref 30.0–36.0)
MCV: 84.4 fL (ref 80.0–100.0)
NRBC: 0 % (ref 0.0–0.2)
PLATELETS: 164 10*3/uL (ref 150–400)
RBC: 5.32 MIL/uL (ref 4.22–5.81)
RDW: 15.2 % (ref 11.5–15.5)
WBC: 5 10*3/uL (ref 4.0–10.5)

## 2018-08-14 LAB — I-STAT TROPONIN, ED: TROPONIN I, POC: 0.01 ng/mL (ref 0.00–0.08)

## 2018-08-14 LAB — GLUCOSE, CAPILLARY
Glucose-Capillary: 57 mg/dL — ABNORMAL LOW (ref 70–99)
Glucose-Capillary: 68 mg/dL — ABNORMAL LOW (ref 70–99)

## 2018-08-14 LAB — TROPONIN I: Troponin I: <0.03 ng/mL (ref <0.03)

## 2018-08-14 MED ORDER — PANTOPRAZOLE SODIUM 40 MG PO TBEC
40.0000 mg | DELAYED_RELEASE_TABLET | Freq: Two times a day (BID) | ORAL | Status: DC
  Administered 2018-08-14 – 2018-08-15 (×2): 40 mg via ORAL
  Filled 2018-08-14 (×2): qty 1

## 2018-08-14 MED ORDER — INSULIN DETEMIR 100 UNIT/ML ~~LOC~~ SOLN
20.0000 [IU] | Freq: Every day | SUBCUTANEOUS | Status: DC
  Filled 2018-08-14: qty 0.2

## 2018-08-14 MED ORDER — METOPROLOL TARTRATE 25 MG PO TABS
25.0000 mg | ORAL_TABLET | Freq: Two times a day (BID) | ORAL | Status: DC
  Administered 2018-08-14 – 2018-08-15 (×2): 25 mg via ORAL
  Filled 2018-08-14 (×2): qty 1

## 2018-08-14 MED ORDER — PRASUGREL HCL 10 MG PO TABS
10.0000 mg | ORAL_TABLET | Freq: Every day | ORAL | Status: DC
  Administered 2018-08-15: 10 mg via ORAL
  Filled 2018-08-14: qty 1

## 2018-08-14 MED ORDER — ONDANSETRON HCL 4 MG/2ML IJ SOLN: 4.0000 mg | Freq: Four times a day (QID) | INTRAMUSCULAR | Status: DC | PRN

## 2018-08-14 MED ORDER — HYDROCHLOROTHIAZIDE 25 MG PO TABS
25.0000 mg | ORAL_TABLET | Freq: Every day | ORAL | Status: DC
  Administered 2018-08-15: 25 mg via ORAL
  Filled 2018-08-14: qty 1

## 2018-08-14 MED ORDER — ASPIRIN EC 81 MG PO TBEC
81.0000 mg | DELAYED_RELEASE_TABLET | Freq: Every day | ORAL | Status: DC
  Administered 2018-08-14 – 2018-08-15 (×2): 81 mg via ORAL
  Filled 2018-08-14 (×2): qty 1

## 2018-08-14 MED ORDER — NITROGLYCERIN 0.4 MG SL SUBL: 0.4000 mg | SUBLINGUAL_TABLET | SUBLINGUAL | Status: DC | PRN

## 2018-08-14 MED ORDER — ISOSORBIDE MONONITRATE ER 30 MG PO TB24
60.0000 mg | ORAL_TABLET | Freq: Every day | ORAL | Status: DC
  Administered 2018-08-15: 60 mg via ORAL
  Filled 2018-08-14: qty 2

## 2018-08-14 MED ORDER — ACETAMINOPHEN 325 MG PO TABS
650.0000 mg | ORAL_TABLET | Freq: Four times a day (QID) | ORAL | Status: DC | PRN
  Administered 2018-08-14 – 2018-08-15 (×2): 650 mg via ORAL
  Filled 2018-08-14 (×2): qty 2

## 2018-08-14 MED ORDER — MORPHINE SULFATE (PF) 2 MG/ML IV SOLN: 2.0000 mg | INTRAVENOUS | Status: DC | PRN

## 2018-08-14 MED ORDER — ROSUVASTATIN CALCIUM 40 MG PO TABS: 40.0000 mg | ORAL_TABLET | Freq: Every day | ORAL | Status: DC

## 2018-08-14 MED ORDER — PNEUMOCOCCAL VAC POLYVALENT 25 MCG/0.5ML IJ INJ
0.5000 mL | INJECTION | INTRAMUSCULAR | Status: DC
  Filled 2018-08-14: qty 0.5

## 2018-08-14 MED ORDER — INSULIN DETEMIR 100 UNIT/ML FLEXPEN: 20.0000 [IU] | PEN_INJECTOR | Freq: Every day | SUBCUTANEOUS | Status: DC

## 2018-08-14 MED ORDER — ENOXAPARIN SODIUM 40 MG/0.4ML ~~LOC~~ SOLN
40.0000 mg | SUBCUTANEOUS | Status: DC
  Administered 2018-08-15: 40 mg via SUBCUTANEOUS
  Filled 2018-08-14: qty 0.4

## 2018-08-14 MED ORDER — ACETAMINOPHEN 325 MG PO TABS: 650.0000 mg | ORAL_TABLET | ORAL | Status: DC | PRN

## 2018-08-14 MED ORDER — LOSARTAN POTASSIUM-HCTZ 100-25 MG PO TABS: 1.0000 | ORAL_TABLET | Freq: Every day | ORAL | Status: DC

## 2018-08-14 MED ORDER — LOSARTAN POTASSIUM 50 MG PO TABS
100.0000 mg | ORAL_TABLET | Freq: Every day | ORAL | Status: DC
  Administered 2018-08-15: 100 mg via ORAL
  Filled 2018-08-14: qty 2
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
## 2018-08-14 NOTE — ED Notes (Signed)
emt tech came up to notify this RN that pt's O2 is at 87% advised her to place nasal canula and O2 on patient at 2%
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
## 2018-08-14 NOTE — ED Provider Notes (Signed)
I saw and evaluated the patient, reviewed the resident's note and I agree with the findings and plan.  EKG: EKG Interpretation  Date/Time:  Friday August 14 2018 13:46:49 EDT Ventricular Rate:  61 PR Interval:  166 QRS Duration: 88 QT Interval:  378 QTC Calculation: 380 R Axis:   15 Text Interpretation:  Normal sinus rhythm Minimal voltage criteria for LVH, may be normal variant Inferior infarct , age undetermined T wave abnormality, consider anterolateral ischemia Abnormal ECG no sig change from previous Confirmed by Charlesetta Shanks [phone]) on 08/14/2018 7:57:23 PM Has history of coronary artery disease.  He has been having chest pain waxing and waning throughout the course of the day.  Reports pain is similar to what is had in the past when he had an MI.  Is alert and appropriate.  Mental status clear.  Heart is regular no gross rub murmur gallop.  Lungs clear to auscultation.  Abdomen soft and nontender.  Lower extremities with chronic, dry ulcers but no significant edema.  Patient's chest pain is described as similar to prior MI.  Has been waxing and waning and continues to recrudesce.  Patient is high risk for ACS.  Plan for observation admission.   Charlesetta Shanks, MD 08/14/18 2100
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
## 2018-08-15 ENCOUNTER — Encounter (HOSPITAL_COMMUNITY): Payer: Self-pay

## 2018-08-15 DIAGNOSIS — I25810 Atherosclerosis of coronary artery bypass graft(s) without angina pectoris: Secondary | ICD-10-CM | POA: Diagnosis not present

## 2018-08-15 LAB — GLUCOSE, CAPILLARY
GLUCOSE-CAPILLARY: 153 mg/dL — AB (ref 70–99)
Glucose-Capillary: 120 mg/dL — ABNORMAL HIGH (ref 70–99)

## 2018-08-15 LAB — TROPONIN I: Troponin I: <0.03 ng/mL (ref <0.03)

## 2018-08-15 LAB — HIV ANTIBODY (ROUTINE TESTING W REFLEX): HIV SCREEN 4TH GENERATION: NONREACTIVE

## 2018-08-15 MED ORDER — INSULIN ASPART 100 UNIT/ML ~~LOC~~ SOLN
0.0000 [IU] | Freq: Three times a day (TID) | SUBCUTANEOUS | Status: DC
  Administered 2018-08-15: 2 [IU] via SUBCUTANEOUS

## 2018-08-15 NOTE — Progress Notes (Signed)
Discharge instructions reviewed with patient, no prescriptions, cardiologist is to follow up patient and set a appointment Neta Mends RN 12:32 PM 08-15-2018
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
## 2018-08-15 NOTE — Plan of Care (Signed)
°  Problem: Clinical Measurements: °Goal: Ability to maintain clinical measurements within normal limits will improve °Outcome: Progressing °Goal: Cardiovascular complication will be avoided °Outcome: Progressing °  °Problem: Activity: °Goal: Risk for activity intolerance will decrease °Outcome: Progressing °  °Problem: Nutrition: °Goal: Adequate nutrition will be maintained °Outcome: Progressing °  °

## 2018-08-27 ENCOUNTER — Ambulatory Visit (INDEPENDENT_AMBULATORY_CARE_PROVIDER_SITE_OTHER): Payer: Medicare HMO | Admitting: Physician Assistant

## 2018-08-27 ENCOUNTER — Encounter: Payer: Self-pay | Admitting: Physician Assistant

## 2018-08-27 VITALS — BP 130/82 | HR 64 | Ht 67.0 in | Wt 178.0 lb

## 2018-08-27 DIAGNOSIS — I25810 Atherosclerosis of coronary artery bypass graft(s) without angina pectoris: Secondary | ICD-10-CM | POA: Diagnosis not present

## 2018-08-27 DIAGNOSIS — E785 Hyperlipidemia, unspecified: Secondary | ICD-10-CM | POA: Diagnosis not present

## 2018-08-27 DIAGNOSIS — R0789 Other chest pain: Secondary | ICD-10-CM | POA: Diagnosis not present

## 2018-08-27 DIAGNOSIS — I10 Essential (primary) hypertension: Secondary | ICD-10-CM | POA: Diagnosis not present

## 2018-08-27 DIAGNOSIS — E119 Type 2 diabetes mellitus without complications: Secondary | ICD-10-CM

## 2018-08-27 DIAGNOSIS — Z794 Long term (current) use of insulin: Secondary | ICD-10-CM

## 2018-08-27 MED ORDER — ISOSORBIDE MONONITRATE ER 60 MG PO TB24: 90.0000 mg | ORAL_TABLET | Freq: Every day | ORAL | 6 refills | Status: DC

## 2018-08-27 NOTE — Patient Instructions (Addendum)
Medication Instructions:  INCREASE Imdur to 90mg  once day   If you need a refill on your cardiac medications before your next appointment, please call your pharmacy.   Lab work: None  If you have labs (blood work) drawn today and your tests are completely normal, you will receive your results only by: Marland Kitchen MyChart Message (if you have MyChart) OR . A paper copy in the mail If you have any lab test that is abnormal or we need to change your treatment, we will call you to review the results.  Testing/Procedures: None   Follow-Up: At [facility], you and your health needs are our priority.  As part of our continuing mission to provide you with exceptional heart care, we have created designated Provider Care Teams.  These Care Teams include your primary Cardiologist (physician) and Advanced Practice Providers (APPs -  Physician Assistants and Nurse Practitioners) who all work together to provide you with the care you need, when you need it. Your physician recommends that you schedule a follow-up appointment in: 3 months with  Dr Martinique  Any Other Special Instructions Will Be Listed Below (If Applicable).
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
## 2018-08-30 ENCOUNTER — Encounter: Payer: Self-pay | Admitting: Physician Assistant

## 2018-10-08 ENCOUNTER — Encounter (HOSPITAL_BASED_OUTPATIENT_CLINIC_OR_DEPARTMENT_OTHER): Payer: Medicare HMO

## 2018-10-19 ENCOUNTER — Encounter (HOSPITAL_BASED_OUTPATIENT_CLINIC_OR_DEPARTMENT_OTHER): Payer: Medicare HMO | Attending: Internal Medicine
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
## 2018-11-30 ENCOUNTER — Ambulatory Visit: Payer: Medicare HMO | Admitting: Cardiology

## 2018-12-01 ENCOUNTER — Encounter: Payer: Self-pay | Admitting: *Deleted

## 2018-12-29 IMAGING — CT CT HEAD W/O CM
4 series · 16 of 47 positions shown, 18 images · non-contrast
Comparison: Head CT 09/14/2011

CLINICAL DATA: Headache, dizziness and chest pain.

EXAM:
CT HEAD WITHOUT CONTRAST
TECHNIQUE: Contiguous axial images were obtained from the base of the skull
through the vertex without intravenous contrast.

[Series 3: head without · axial · non-contrast · 0.45mm/px · z∈[+1340,+1460]mm · 7 of 32 slices shown, 9 images]
[im 4/32  brain]
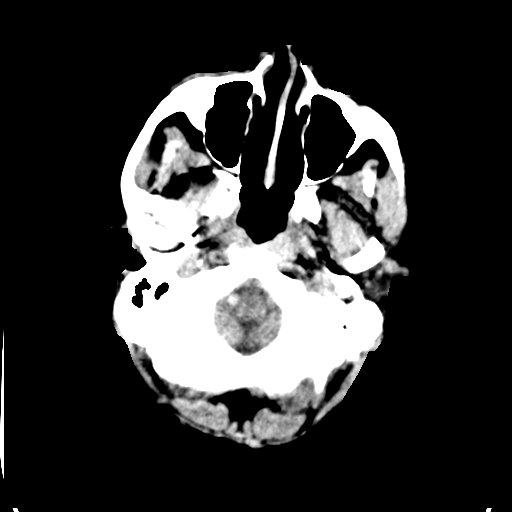
[im 4/32  bone]
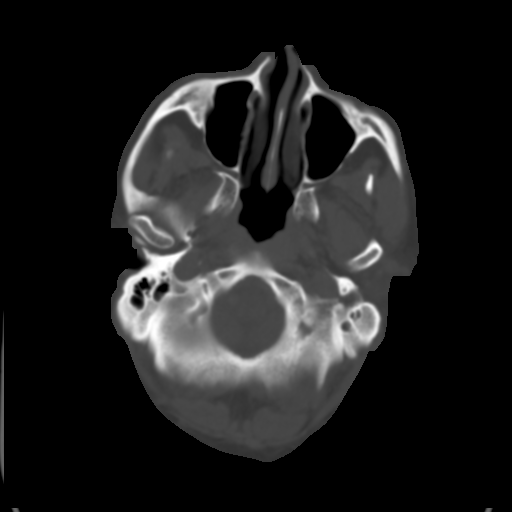
[im 8/32  brain]
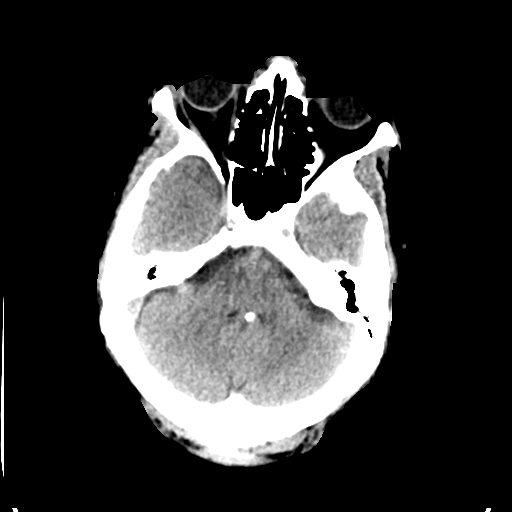
[im 12/32  brain]
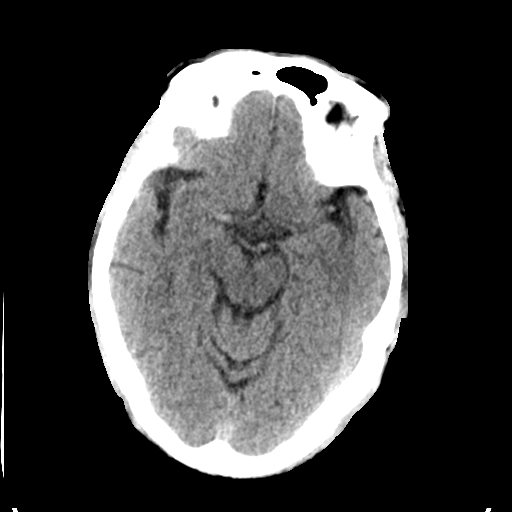
[im 16/32  brain]
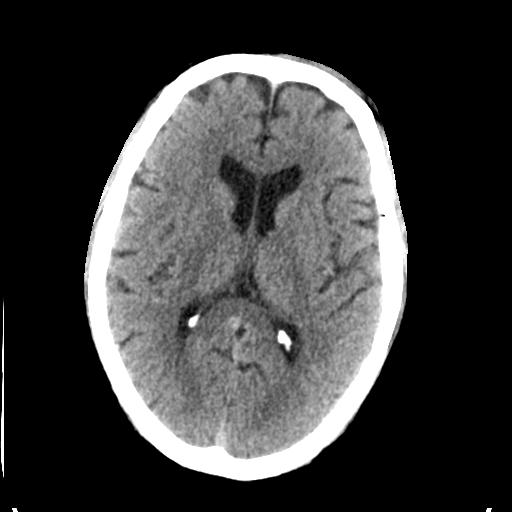
[im 20/32  brain]
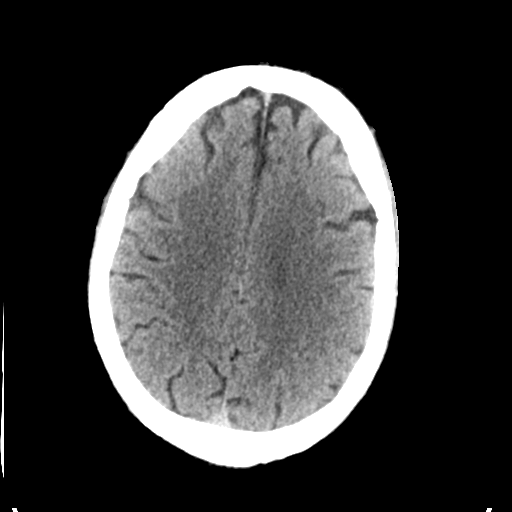
[im 20/32  bone]
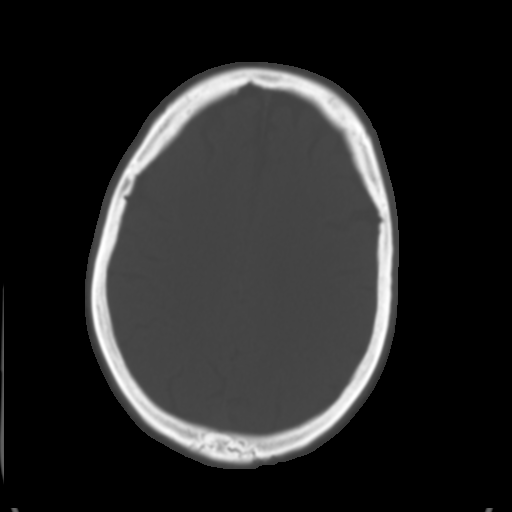
[im 24/32  brain]
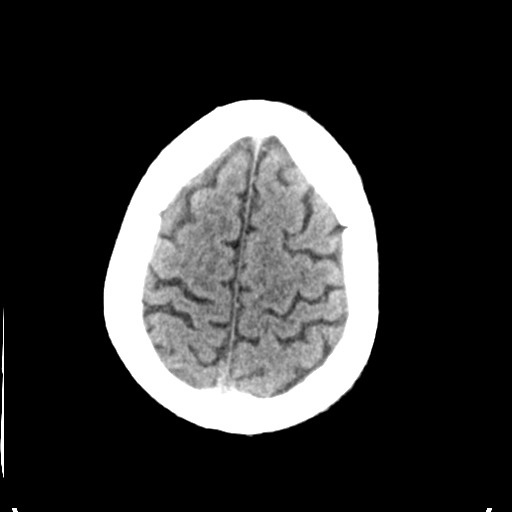
[im 28/32  brain]
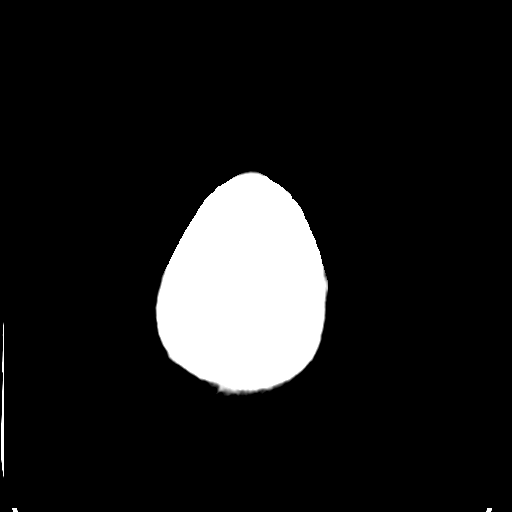

[Series 4: head bone · axial · 0.45mm/px · z∈[+1339,+1371]mm · 3 of 78 slices shown]
[im 8/78  bone]
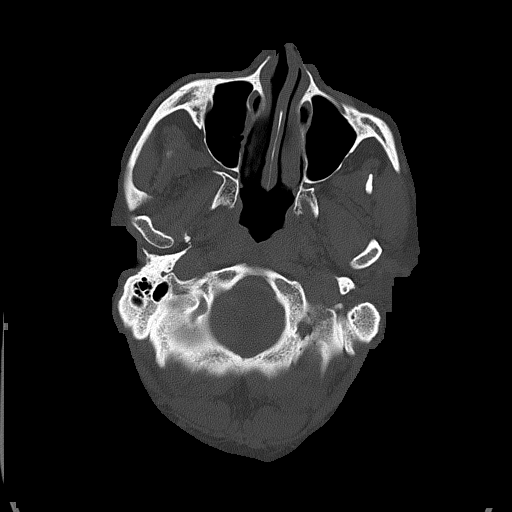
[im 16/78  bone]
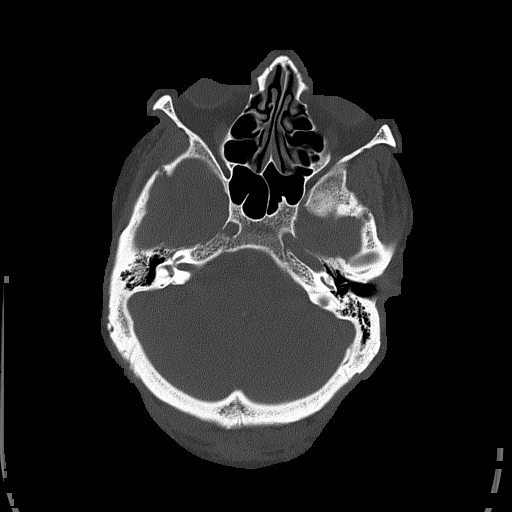
[im 24/78  bone]
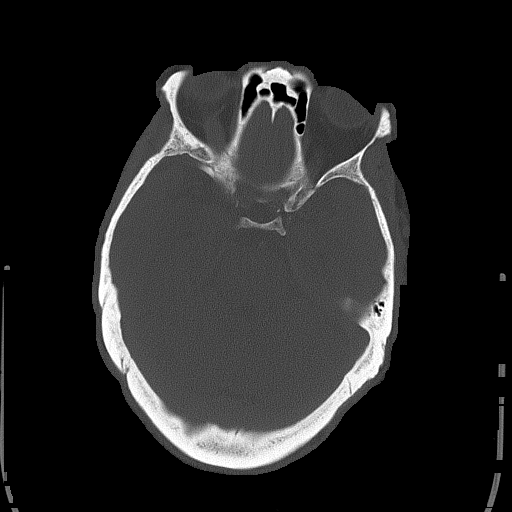

[Series 5: head without cor · coronal · non-contrast · 0.40mm/px · 3 of 73 slices shown]
[im 25/73  brain]
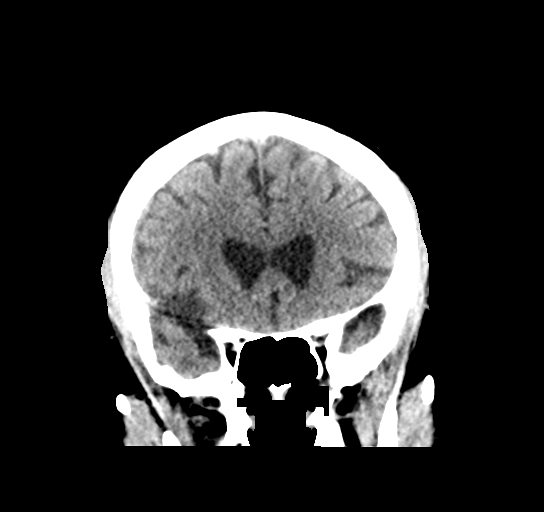
[im 33/73  brain]
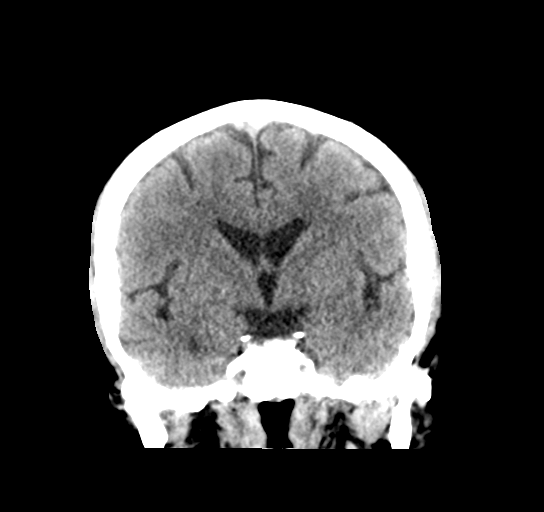
[im 41/73  brain]
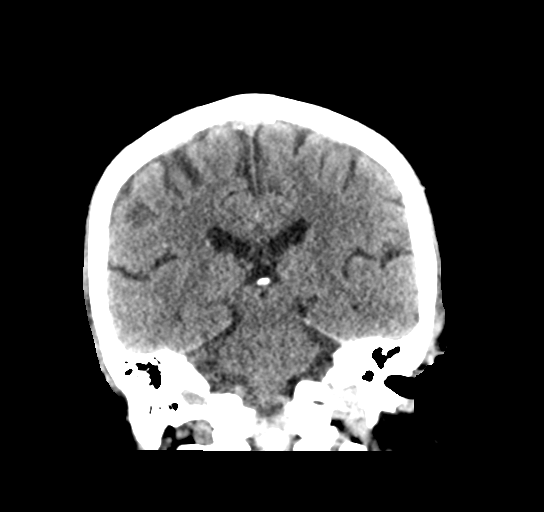

[Series 6: head without sag · sagittal · non-contrast · 0.30mm/px · 3 of 67 slices shown]
[im 23/67  brain]
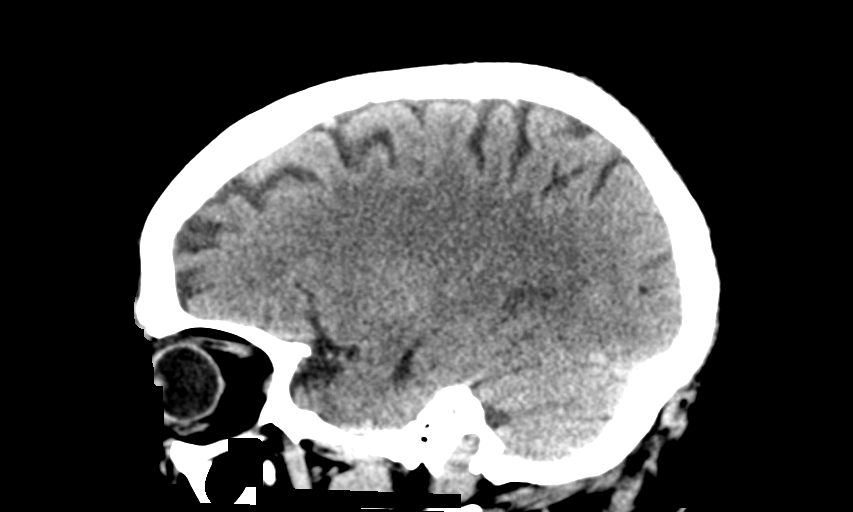
[im 34/67  brain]
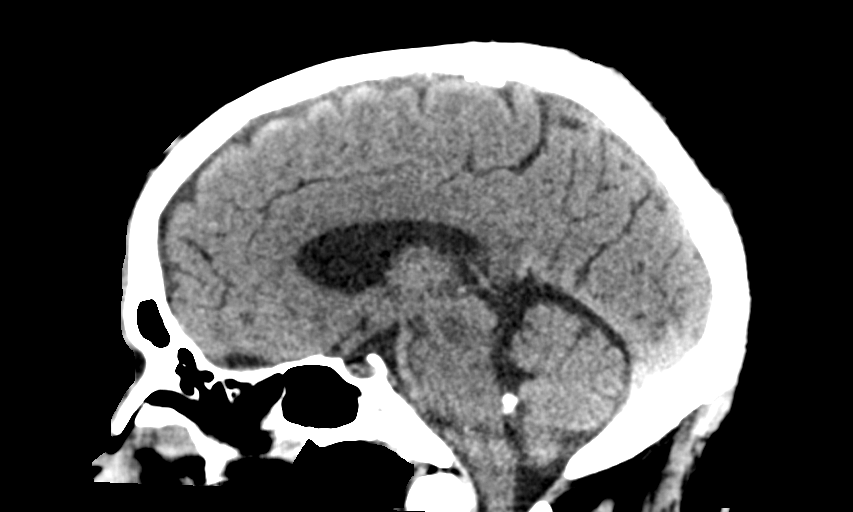
[im 45/67  brain]
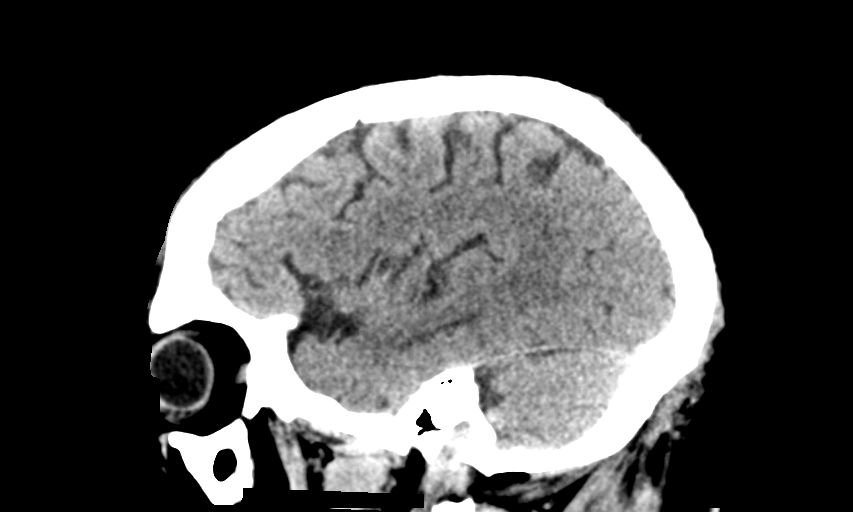

[16 of 47 positions shown; findings below may reference images not displayed]

FINDINGS: Brain: No evidence of acute infarction, hemorrhage, hydrocephalus,
extra-axial collection or mass lesion/mass effect.

Vascular: Mild calcific atherosclerotic disease at the skull base.

Skull: Normal. Negative for fracture or focal lesion.

Sinuses/Orbits: No acute finding.

Other: None.
IMPRESSION: No acute intracranial abnormality.

Atrophy, chronic microvascular disease.

## 2019-06-14 ENCOUNTER — Other Ambulatory Visit: Payer: Self-pay

## 2019-06-14 MED ORDER — LOSARTAN POTASSIUM-HCTZ 100-25 MG PO TABS: 1.0000 | ORAL_TABLET | Freq: Every day | ORAL | 3 refills | Status: DC

## 2019-06-14 MED ORDER — METOPROLOL TARTRATE 25 MG PO TABS: 25.0000 mg | ORAL_TABLET | Freq: Two times a day (BID) | ORAL | 3 refills | Status: DC

## 2019-06-14 MED ORDER — ROSUVASTATIN CALCIUM 40 MG PO TABS: 40.0000 mg | ORAL_TABLET | Freq: Every day | ORAL | 3 refills | Status: DC

## 2019-06-14 MED ORDER — ISOSORBIDE MONONITRATE ER 60 MG PO TB24: 90.0000 mg | ORAL_TABLET | Freq: Every day | ORAL | 6 refills | Status: DC

## 2019-06-14 MED ORDER — PRASUGREL HCL 10 MG PO TABS: 10.0000 mg | ORAL_TABLET | Freq: Every day | ORAL | 3 refills | Status: DC
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
## 2019-08-17 ENCOUNTER — Encounter: Payer: Self-pay | Admitting: Cardiology

## 2019-08-17 ENCOUNTER — Other Ambulatory Visit: Payer: Self-pay

## 2019-08-17 ENCOUNTER — Ambulatory Visit (INDEPENDENT_AMBULATORY_CARE_PROVIDER_SITE_OTHER): Payer: Medicare Other | Admitting: Cardiology

## 2019-08-17 ENCOUNTER — Other Ambulatory Visit: Payer: Self-pay | Admitting: Cardiology

## 2019-08-17 VITALS — BP 137/88 | HR 58 | Ht 67.5 in | Wt 177.0 lb

## 2019-08-17 DIAGNOSIS — Z951 Presence of aortocoronary bypass graft: Secondary | ICD-10-CM

## 2019-08-17 DIAGNOSIS — I10 Essential (primary) hypertension: Secondary | ICD-10-CM

## 2019-08-17 DIAGNOSIS — I25700 Atherosclerosis of coronary artery bypass graft(s), unspecified, with unstable angina pectoris: Secondary | ICD-10-CM | POA: Diagnosis not present

## 2019-08-17 DIAGNOSIS — I200 Unstable angina: Secondary | ICD-10-CM

## 2019-08-17 DIAGNOSIS — E785 Hyperlipidemia, unspecified: Secondary | ICD-10-CM

## 2019-08-17 MED ORDER — SODIUM CHLORIDE 0.9% FLUSH: 3.0000 mL | Freq: Two times a day (BID) | INTRAVENOUS | Status: DC

## 2019-08-17 NOTE — Patient Instructions (Signed)
Medication Instructions:  Continue same medications *If you need a refill on your cardiac medications before your next appointment, please call your pharmacy*  Lab Work: Cmet,cbc,lipid panel,a1c,pt  today   Testing/Procedures: Cardiac cath scheduled Thursday 08/26/19   Follow directions below  Follow-Up: At [facility], you and your health needs are our priority.  As part of our continuing mission to provide you with exceptional heart care, we have created designated Provider Care Teams.  These Care Teams include your primary Cardiologist (physician) and Advanced Practice Providers (APPs -  Physician Assistants and Nurse Practitioners) who all work together to provide you with the care you need, when you need it.  Your next appointment:  Monday 09/13/19    The format for your next appointment:  Office   Provider:  Jory Sims DNP 11:00 am        [place] [address] Dept: [phone] Loc: [place]  08/17/2019  You are scheduled for a Cardiac Cath on Thursday 08/26/19, with Dr.Jordan.  1. Please arrive at the [facility] (Main Entrance A) at [facility]: [address] [place], [place] [address] at 7:00 am (This time is two hours before your procedure to ensure your preparation). Free valet parking service is available.   Special note: Every effort is made to have your procedure done on time. Please understand that emergencies sometimes delay scheduled procedures.  2. Diet: Do not eat solid foods after midnight.  The patient may have clear liquids until 5am upon the day of the procedure.  3. Labs: You will need to have blood drawn on Tuesday 08/17/19. You do not need to be fasting.You will need to have Covid test at [facility] drive thru site at the [place] Monday 08/23/19 at 10:50 am.Then you will need to quarantine  until after cath.  4. Medication instructions in preparation for your procedure:   Hold Hyzaar ( Losartan/HCTZ )  day before and morning of cath   Hold Metformin day before cath and 2 days after cath   Hold Insulin morning of cath   On the morning of your procedure, take your Aspirin and Effient and any morning medicines NOT listed above.  You may use sips of water.  5. Plan for one night stay--bring personal belongings. 6. Bring a current list of your medications and current insurance cards. 7. You MUST have a responsible person to drive you home. 8. Someone MUST be with you the first 24 hours after you arrive home or your discharge will be delayed. 9. Please wear clothes that are easy to get on and off and wear slip-on shoes.  Thank you for allowing [place] to care for you!   -- [place] Invasive Cardiovascular services

## 2019-08-17 NOTE — Addendum Note (Signed)
Addended by: Kathyrn Lass on: 08/17/2019 10:54 AM   Modules accepted: Orders

## 2019-08-18 ENCOUNTER — Encounter: Payer: Self-pay | Admitting: Cardiology

## 2019-08-18 ENCOUNTER — Other Ambulatory Visit: Payer: Self-pay | Admitting: Cardiology

## 2019-08-18 LAB — LIPID PANEL
Chol/HDL Ratio: 3 ratio (ref 0.0–5.0)
Cholesterol, Total: 124 mg/dL (ref 100–199)
HDL: 41 mg/dL (ref >39)
LDL Chol Calc (NIH): 68 mg/dL (ref 0–99)
Triglycerides: 76 mg/dL (ref 0–149)
VLDL Cholesterol Cal: 15 mg/dL (ref 5–40)

## 2019-08-18 LAB — CBC WITH DIFFERENTIAL/PLATELET
Basophils Absolute: 0 10*3/uL (ref 0.0–0.2)
Basos: 0 %
EOS (ABSOLUTE): 0.2 10*3/uL (ref 0.0–0.4)
Eos: 3 %
Hematocrit: 41.2 % (ref 37.5–51.0)
Hemoglobin: 13.9 g/dL (ref 13.0–17.7)
Immature Grans (Abs): 0 10*3/uL (ref 0.0–0.1)
Immature Granulocytes: 0 %
Lymphocytes Absolute: 1.7 10*3/uL (ref 0.7–3.1)
Lymphs: 37 %
MCH: 28.5 pg (ref 26.6–33.0)
MCHC: 33.7 g/dL (ref 31.5–35.7)
MCV: 85 fL (ref 79–97)
Monocytes Absolute: 0.4 10*3/uL (ref 0.1–0.9)
Monocytes: 8 %
Neutrophils Absolute: 2.4 10*3/uL (ref 1.4–7.0)
Neutrophils: 52 %
Platelets: 170 10*3/uL (ref 150–450)
RBC: 4.87 x10E6/uL (ref 4.14–5.80)
RDW: 15.1 % (ref 11.6–15.4)
WBC: 4.7 10*3/uL (ref 3.4–10.8)

## 2019-08-18 LAB — COMPREHENSIVE METABOLIC PANEL
ALT: 20 IU/L (ref 0–44)
AST: 26 IU/L (ref 0–40)
Albumin/Globulin Ratio: 1.5 (ref 1.2–2.2)
Albumin: 4.3 g/dL (ref 3.8–4.8)
Alkaline Phosphatase: 69 IU/L (ref 39–117)
BUN/Creatinine Ratio: 8 — ABNORMAL LOW (ref 10–24)
BUN: 11 mg/dL (ref 8–27)
Bilirubin Total: 0.4 mg/dL (ref 0.0–1.2)
CO2: 26 mmol/L (ref 20–29)
Calcium: 9.3 mg/dL (ref 8.6–10.2)
Chloride: 103 mmol/L (ref 96–106)
Creatinine, Ser: 1.32 mg/dL — ABNORMAL HIGH (ref 0.76–1.27)
GFR calc Af Amer: 63 mL/min/{1.73_m2} (ref >59)
GFR calc non Af Amer: 54 mL/min/{1.73_m2} — ABNORMAL LOW (ref >59)
Globulin, Total: 2.8 g/dL (ref 1.5–4.5)
Glucose: 103 mg/dL — ABNORMAL HIGH (ref 65–99)
Potassium: 4.1 mmol/L (ref 3.5–5.2)
Sodium: 140 mmol/L (ref 134–144)
Total Protein: 7.1 g/dL (ref 6.0–8.5)

## 2019-08-18 LAB — HEMOGLOBIN A1C
Est. average glucose Bld gHb Est-mCnc: 154 mg/dL
Hgb A1c MFr Bld: 7 % — ABNORMAL HIGH (ref 4.8–5.6)

## 2019-08-18 LAB — PT AND PTT
INR: 1 (ref 0.9–1.2)
Prothrombin Time: 11 s (ref 9.1–12.0)
aPTT: 25 s (ref 24–33)

## 2019-08-18 MED ORDER — NITROGLYCERIN 0.4 MG SL SUBL: SUBLINGUAL_TABLET | SUBLINGUAL | 6 refills | Status: DC

## 2019-08-18 NOTE — Telephone Encounter (Signed)
error 

## 2019-08-23 ENCOUNTER — Other Ambulatory Visit (HOSPITAL_COMMUNITY)
Admission: RE | Admit: 2019-08-23 | Discharge: 2019-08-23 | Disposition: A | Discharge status: Home / Self Care | Payer: Medicare Other | Source: Ambulatory Visit | Attending: Cardiology | Admitting: Cardiology

## 2019-08-23 DIAGNOSIS — Z20828 Contact with and (suspected) exposure to other viral communicable diseases: Secondary | ICD-10-CM | POA: Diagnosis not present

## 2019-08-23 DIAGNOSIS — Z01812 Encounter for preprocedural laboratory examination: Secondary | ICD-10-CM | POA: Insufficient documentation

## 2019-08-24 LAB — NOVEL CORONAVIRUS, NAA (HOSP ORDER, SEND-OUT TO REF LAB; TAT 18-24 HRS): SARS-CoV-2, NAA: NOT DETECTED

## 2019-08-25 ENCOUNTER — Telehealth: Payer: Self-pay | Admitting: *Deleted

## 2019-08-25 NOTE — Telephone Encounter (Signed)
Pt contacted pre-catheterization scheduled at [facility] for: Thursday August 26, 2019 9 AM Verified arrival time and place: [place] [facility]) at: 7AM   No solid food after midnight prior to cath, clear liquids until 5 AM day of procedure. Contrast allergy: no  Hold: Janumet-day of procedure and 48 hours post proceudure. Insulin-AM of procedure. Losartan-HCT-AM of procedure.  Except hold medications AM meds can be  taken pre-cath with sip of water including: ASA 81 mg Effient 10 mg   Confirmed patient has responsible adult to drive home post procedure and observe 24 hours after arriving home: yes  Currently, due to Covid-19 pandemic, only one support person will be allowed with patient. Must be the same support person for that patient's entire stay, will be screened and required to wear a mask. They will be asked to wait in the waiting room for the duration of the patient's stay.  Patients are required to wear a mask when they enter the hospital.     COVID-19 Pre-Screening Questions:  . In the past 7 to 10 days have you had a cough,  shortness of breath, headache, congestion, fever (100 or greater) body aches, chills, sore throat, or sudden loss of taste or sense of smell? Shortness of breath/chest discomfort with exertion . Have you been around anyone with known Covid 19? no . Have you been around anyone who is awaiting Covid 19 test results in the past 7 to 10 days? no . Have you been around anyone who has been exposed to Covid 19, or has mentioned symptoms of Covid 19 within the past 7 to 10 days? no   I reviewed procedure/mask/visitor instructions, Covid-19 screening questions with patient, he verbalized understanding, thanked me for call.

## 2019-08-26 ENCOUNTER — Other Ambulatory Visit: Payer: Self-pay

## 2019-08-26 ENCOUNTER — Encounter (HOSPITAL_COMMUNITY)
Admission: RE | Disposition: A | Discharge status: Home / Self Care | Payer: Self-pay | Source: Home / Self Care | Attending: Cardiology

## 2019-08-26 ENCOUNTER — Encounter (HOSPITAL_COMMUNITY): Payer: Self-pay | Admitting: Cardiology

## 2019-08-26 ENCOUNTER — Observation Stay (HOSPITAL_COMMUNITY)
Admission: RE | Admit: 2019-08-26 | Discharge: 2019-08-28 | Disposition: A | Discharge status: Home / Self Care | Payer: Medicare Other | Attending: Cardiology | Admitting: Cardiology

## 2019-08-26 DIAGNOSIS — I252 Old myocardial infarction: Secondary | ICD-10-CM | POA: Diagnosis not present

## 2019-08-26 DIAGNOSIS — Z794 Long term (current) use of insulin: Secondary | ICD-10-CM | POA: Insufficient documentation

## 2019-08-26 DIAGNOSIS — I25110 Atherosclerotic heart disease of native coronary artery with unstable angina pectoris: Secondary | ICD-10-CM | POA: Diagnosis not present

## 2019-08-26 DIAGNOSIS — Z8249 Family history of ischemic heart disease and other diseases of the circulatory system: Secondary | ICD-10-CM | POA: Insufficient documentation

## 2019-08-26 DIAGNOSIS — I10 Essential (primary) hypertension: Secondary | ICD-10-CM | POA: Diagnosis present

## 2019-08-26 DIAGNOSIS — Z951 Presence of aortocoronary bypass graft: Secondary | ICD-10-CM

## 2019-08-26 DIAGNOSIS — E1169 Type 2 diabetes mellitus with other specified complication: Secondary | ICD-10-CM | POA: Diagnosis present

## 2019-08-26 DIAGNOSIS — E785 Hyperlipidemia, unspecified: Secondary | ICD-10-CM | POA: Diagnosis not present

## 2019-08-26 DIAGNOSIS — Z888 Allergy status to other drugs, medicaments and biological substances status: Secondary | ICD-10-CM | POA: Diagnosis not present

## 2019-08-26 DIAGNOSIS — I200 Unstable angina: Secondary | ICD-10-CM | POA: Diagnosis present

## 2019-08-26 DIAGNOSIS — M1990 Unspecified osteoarthritis, unspecified site: Secondary | ICD-10-CM | POA: Diagnosis not present

## 2019-08-26 DIAGNOSIS — Z87891 Personal history of nicotine dependence: Secondary | ICD-10-CM | POA: Diagnosis not present

## 2019-08-26 DIAGNOSIS — E1122 Type 2 diabetes mellitus with diabetic chronic kidney disease: Secondary | ICD-10-CM | POA: Diagnosis not present

## 2019-08-26 DIAGNOSIS — N400 Enlarged prostate without lower urinary tract symptoms: Secondary | ICD-10-CM | POA: Diagnosis not present

## 2019-08-26 DIAGNOSIS — I25710 Atherosclerosis of autologous vein coronary artery bypass graft(s) with unstable angina pectoris: Secondary | ICD-10-CM | POA: Diagnosis not present

## 2019-08-26 DIAGNOSIS — Z9861 Coronary angioplasty status: Secondary | ICD-10-CM | POA: Diagnosis not present

## 2019-08-26 DIAGNOSIS — K219 Gastro-esophageal reflux disease without esophagitis: Secondary | ICD-10-CM | POA: Diagnosis not present

## 2019-08-26 DIAGNOSIS — N182 Chronic kidney disease, stage 2 (mild): Secondary | ICD-10-CM | POA: Diagnosis not present

## 2019-08-26 DIAGNOSIS — Z79899 Other long term (current) drug therapy: Secondary | ICD-10-CM | POA: Diagnosis not present

## 2019-08-26 DIAGNOSIS — Z95820 Peripheral vascular angioplasty status with implants and grafts: Secondary | ICD-10-CM

## 2019-08-26 DIAGNOSIS — E1159 Type 2 diabetes mellitus with other circulatory complications: Secondary | ICD-10-CM | POA: Diagnosis present

## 2019-08-26 DIAGNOSIS — Z885 Allergy status to narcotic agent status: Secondary | ICD-10-CM | POA: Insufficient documentation

## 2019-08-26 DIAGNOSIS — I129 Hypertensive chronic kidney disease with stage 1 through stage 4 chronic kidney disease, or unspecified chronic kidney disease: Secondary | ICD-10-CM | POA: Insufficient documentation

## 2019-08-26 DIAGNOSIS — E119 Type 2 diabetes mellitus without complications: Secondary | ICD-10-CM

## 2019-08-26 DIAGNOSIS — Z833 Family history of diabetes mellitus: Secondary | ICD-10-CM | POA: Insufficient documentation

## 2019-08-26 DIAGNOSIS — I25810 Atherosclerosis of coronary artery bypass graft(s) without angina pectoris: Secondary | ICD-10-CM

## 2019-08-26 DIAGNOSIS — Z7982 Long term (current) use of aspirin: Secondary | ICD-10-CM | POA: Diagnosis not present

## 2019-08-26 DIAGNOSIS — I2510 Atherosclerotic heart disease of native coronary artery without angina pectoris: Secondary | ICD-10-CM

## 2019-08-26 HISTORY — PX: LEFT HEART CATH AND CORS/GRAFTS ANGIOGRAPHY: CATH118250

## 2019-08-26 LAB — CBC
HCT: 40.5 % (ref 39.0–52.0)
Hemoglobin: 13.3 g/dL (ref 13.0–17.0)
MCH: 27.7 pg (ref 26.0–34.0)
MCHC: 32.8 g/dL (ref 30.0–36.0)
MCV: 84.4 fL (ref 80.0–100.0)
Platelets: 123 10*3/uL — ABNORMAL LOW (ref 150–400)
RBC: 4.8 MIL/uL (ref 4.22–5.81)
RDW: 15.4 % (ref 11.5–15.5)
WBC: 4.5 10*3/uL (ref 4.0–10.5)
nRBC: 0 % (ref 0.0–0.2)

## 2019-08-26 LAB — GLUCOSE, CAPILLARY
Glucose-Capillary: 136 mg/dL — ABNORMAL HIGH (ref 70–99)
Glucose-Capillary: 131 mg/dL — ABNORMAL HIGH (ref 70–99)
Glucose-Capillary: 214 mg/dL — ABNORMAL HIGH (ref 70–99)
Glucose-Capillary: 95 mg/dL (ref 70–99)

## 2019-08-26 LAB — CREATININE, SERUM
Creatinine, Ser: 1.4 mg/dL — ABNORMAL HIGH (ref 0.61–1.24)
GFR calc Af Amer: 59 mL/min — ABNORMAL LOW (ref >60)
GFR calc non Af Amer: 51 mL/min — ABNORMAL LOW (ref >60)

## 2019-08-26 SURGERY — LEFT HEART CATH AND CORS/GRAFTS ANGIOGRAPHY
Anesthesia: LOCAL

## 2019-08-26 MED ORDER — INSULIN DETEMIR 100 UNIT/ML ~~LOC~~ SOLN
30.0000 [IU] | Freq: Every day | SUBCUTANEOUS | Status: DC
  Administered 2019-08-26 – 2019-08-28 (×2): 30 [IU] via SUBCUTANEOUS
  Filled 2019-08-26 (×3): qty 0.3

## 2019-08-26 MED ORDER — IOHEXOL 350 MG/ML SOLN
INTRAVENOUS | Status: DC | PRN
  Administered 2019-08-26: 10:00:00 75 mL

## 2019-08-26 MED ORDER — SODIUM CHLORIDE 0.9 % WEIGHT BASED INFUSION: 1.0000 mL/kg/h | INTRAVENOUS | Status: DC

## 2019-08-26 MED ORDER — HEPARIN (PORCINE) IN NACL 1000-0.9 UT/500ML-% IV SOLN
INTRAVENOUS | Status: AC
  Filled 2019-08-26: qty 1000

## 2019-08-26 MED ORDER — SODIUM CHLORIDE 0.9 % WEIGHT BASED INFUSION
3.0000 mL/kg/h | INTRAVENOUS | Status: DC
  Administered 2019-08-26: 08:00:00 3 mL/kg/h via INTRAVENOUS

## 2019-08-26 MED ORDER — SODIUM CHLORIDE 0.9% FLUSH
3.0000 mL | Freq: Two times a day (BID) | INTRAVENOUS | Status: DC
  Administered 2019-08-26 – 2019-08-28 (×3): 3 mL via INTRAVENOUS

## 2019-08-26 MED ORDER — SODIUM CHLORIDE 0.9 % WEIGHT BASED INFUSION
3.0000 mL/kg/h | INTRAVENOUS | Status: DC
  Administered 2019-08-27: 3 mL/kg/h via INTRAVENOUS

## 2019-08-26 MED ORDER — VITAMIN B-6 100 MG PO TABS
100.0000 mg | ORAL_TABLET | Freq: Every day | ORAL | Status: DC
  Administered 2019-08-27 – 2019-08-28 (×2): 100 mg via ORAL
  Filled 2019-08-26 (×2): qty 1

## 2019-08-26 MED ORDER — LIDOCAINE HCL (PF) 1 % IJ SOLN
INTRAMUSCULAR | Status: DC | PRN
  Administered 2019-08-26: 10 mL

## 2019-08-26 MED ORDER — MIDAZOLAM HCL 2 MG/2ML IJ SOLN
INTRAMUSCULAR | Status: DC | PRN
  Administered 2019-08-26 (×2): 1 mg via INTRAVENOUS

## 2019-08-26 MED ORDER — INSULIN ASPART 100 UNIT/ML ~~LOC~~ SOLN
0.0000 [IU] | Freq: Three times a day (TID) | SUBCUTANEOUS | Status: DC
  Administered 2019-08-26: 17:00:00 5 [IU] via SUBCUTANEOUS
  Administered 2019-08-28: 3 [IU] via SUBCUTANEOUS

## 2019-08-26 MED ORDER — PRASUGREL HCL 10 MG PO TABS
10.0000 mg | ORAL_TABLET | Freq: Every day | ORAL | Status: DC
  Administered 2019-08-27 – 2019-08-28 (×2): 10 mg via ORAL
  Filled 2019-08-26 (×3): qty 1

## 2019-08-26 MED ORDER — SODIUM CHLORIDE 0.9 % IV SOLN: 250.0000 mL | INTRAVENOUS | Status: DC | PRN

## 2019-08-26 MED ORDER — SODIUM CHLORIDE 0.9% FLUSH: 3.0000 mL | INTRAVENOUS | Status: DC | PRN

## 2019-08-26 MED ORDER — PANTOPRAZOLE SODIUM 40 MG PO TBEC
40.0000 mg | DELAYED_RELEASE_TABLET | Freq: Two times a day (BID) | ORAL | Status: DC
  Administered 2019-08-26 – 2019-08-28 (×4): 40 mg via ORAL
  Filled 2019-08-26 (×4): qty 1

## 2019-08-26 MED ORDER — ANGIOPLASTY BOOK
Freq: Once | Status: AC
  Administered 2019-08-26: 17:00:00
  Filled 2019-08-26: qty 1

## 2019-08-26 MED ORDER — HEPARIN (PORCINE) IN NACL 1000-0.9 UT/500ML-% IV SOLN
INTRAVENOUS | Status: DC | PRN
  Administered 2019-08-26 (×2): 500 mL

## 2019-08-26 MED ORDER — ASPIRIN 81 MG PO CHEW: 81.0000 mg | CHEWABLE_TABLET | ORAL | Status: DC

## 2019-08-26 MED ORDER — METOPROLOL TARTRATE 25 MG PO TABS
25.0000 mg | ORAL_TABLET | Freq: Two times a day (BID) | ORAL | Status: DC
  Administered 2019-08-26 – 2019-08-28 (×4): 25 mg via ORAL
  Filled 2019-08-26 (×4): qty 1

## 2019-08-26 MED ORDER — ROSUVASTATIN CALCIUM 20 MG PO TABS
40.0000 mg | ORAL_TABLET | Freq: Every day | ORAL | Status: DC
  Administered 2019-08-26 – 2019-08-28 (×3): 40 mg via ORAL
  Filled 2019-08-26 (×3): qty 2

## 2019-08-26 MED ORDER — VITAMIN E 45 MG (100 UNIT) PO CAPS
200.0000 [IU] | ORAL_CAPSULE | Freq: Every day | ORAL | Status: DC
  Administered 2019-08-27 – 2019-08-28 (×2): 200 [IU] via ORAL
  Filled 2019-08-26 (×2): qty 2

## 2019-08-26 MED ORDER — SODIUM CHLORIDE 0.9 % WEIGHT BASED INFUSION
1.0000 mL/kg/h | INTRAVENOUS | Status: AC
  Administered 2019-08-26: 19:00:00 1 mL/kg/h via INTRAVENOUS

## 2019-08-26 MED ORDER — LIDOCAINE HCL (PF) 1 % IJ SOLN
INTRAMUSCULAR | Status: AC
  Filled 2019-08-26: qty 30

## 2019-08-26 MED ORDER — MIDAZOLAM HCL 2 MG/2ML IJ SOLN
INTRAMUSCULAR | Status: AC
  Filled 2019-08-26: qty 2

## 2019-08-26 MED ORDER — FENTANYL CITRATE (PF) 100 MCG/2ML IJ SOLN
INTRAMUSCULAR | Status: AC
  Filled 2019-08-26: qty 2

## 2019-08-26 MED ORDER — NITROGLYCERIN 0.4 MG SL SUBL: 0.4000 mg | SUBLINGUAL_TABLET | SUBLINGUAL | Status: DC | PRN

## 2019-08-26 MED ORDER — VITAMIN B-12 100 MCG PO TABS
500.0000 ug | ORAL_TABLET | Freq: Every day | ORAL | Status: DC
  Administered 2019-08-27 – 2019-08-28 (×2): 500 ug via ORAL
  Filled 2019-08-26: qty 5
  Filled 2019-08-26: qty 1

## 2019-08-26 MED ORDER — ISOSORBIDE MONONITRATE ER 60 MG PO TB24
90.0000 mg | ORAL_TABLET | Freq: Every day | ORAL | Status: DC
  Administered 2019-08-26 – 2019-08-28 (×3): 90 mg via ORAL
  Filled 2019-08-26 (×3): qty 1

## 2019-08-26 MED ORDER — ASPIRIN EC 81 MG PO TBEC
81.0000 mg | DELAYED_RELEASE_TABLET | Freq: Every day | ORAL | Status: DC
  Administered 2019-08-27 – 2019-08-28 (×2): 81 mg via ORAL
  Filled 2019-08-26 (×2): qty 1

## 2019-08-26 MED ORDER — SODIUM CHLORIDE 0.9% FLUSH
3.0000 mL | Freq: Two times a day (BID) | INTRAVENOUS | Status: DC
  Administered 2019-08-26 – 2019-08-28 (×4): 3 mL via INTRAVENOUS

## 2019-08-26 MED ORDER — FENTANYL CITRATE (PF) 100 MCG/2ML IJ SOLN
INTRAMUSCULAR | Status: DC | PRN
  Administered 2019-08-26 (×2): 25 ug via INTRAVENOUS

## 2019-08-26 MED ORDER — HEPARIN SODIUM (PORCINE) 5000 UNIT/ML IJ SOLN
5000.0000 [IU] | Freq: Three times a day (TID) | INTRAMUSCULAR | Status: DC
  Administered 2019-08-27: 05:00:00 5000 [IU] via SUBCUTANEOUS
  Filled 2019-08-26: qty 1

## 2019-08-26 MED ORDER — ONDANSETRON HCL 4 MG/2ML IJ SOLN: 4.0000 mg | Freq: Four times a day (QID) | INTRAMUSCULAR | Status: DC | PRN

## 2019-08-26 MED ORDER — ADULT MULTIVITAMIN W/MINERALS CH
1.0000 | ORAL_TABLET | Freq: Every day | ORAL | Status: DC
  Administered 2019-08-27 – 2019-08-28 (×2): 1 via ORAL
  Filled 2019-08-26 (×2): qty 1

## 2019-08-26 MED ORDER — ACETAMINOPHEN 325 MG PO TABS
650.0000 mg | ORAL_TABLET | ORAL | Status: DC | PRN
  Administered 2019-08-27 – 2019-08-28 (×2): 650 mg via ORAL
  Filled 2019-08-26 (×2): qty 2

## 2019-08-26 MED ORDER — SODIUM CHLORIDE 0.9 % WEIGHT BASED INFUSION
1.0000 mL/kg/h | INTRAVENOUS | Status: DC
  Administered 2019-08-27: 1 mL/kg/h via INTRAVENOUS

## 2019-08-26 SURGICAL SUPPLY — 12 items
CATH 5FR JL3.5 JR4 ANG PIG MP (CATHETERS) ×2 IMPLANT
CATH INFINITI 5 FR IM (CATHETERS) ×2 IMPLANT
CATH INFINITI 5FR AL1 (CATHETERS) ×2 IMPLANT
CATH INFINITI 5FR JL4 (CATHETERS) ×2 IMPLANT
KIT HEART LEFT (KITS) ×2 IMPLANT
PACK CARDIAC CATHETERIZATION (CUSTOM PROCEDURE TRAY) ×2 IMPLANT
SHEATH PINNACLE 5F 10CM (SHEATH) ×2 IMPLANT
SHEATH PROBE COVER 6X72 (BAG) ×2 IMPLANT
SYR MEDRAD MARK 7 150ML (SYRINGE) IMPLANT
TRANSDUCER W/STOPCOCK (MISCELLANEOUS) ×2 IMPLANT
TUBING CIL FLEX 10 FLL-RA (TUBING) ×2 IMPLANT
WIRE EMERALD 3MM-J .035X150CM (WIRE) ×2 IMPLANT

## 2019-08-26 NOTE — Progress Notes (Signed)
Site area: left groin  Site Prior to Removal:  Level 0  Pressure Applied For 20 MINUTES    Minutes Beginning at 1010   Manual:   Yes.    Patient Status During Pull:  Stable   Post Pull Groin Site:  Level 0  Post Pull Instructions Given:  Yes.    Post Pull Pulses Present:  Yes.    Dressing Applied:  Yes.    Comments:  Bed rest started at 1030 X 4 hr.

## 2019-08-26 NOTE — Interval H&P Note (Signed)
History and Physical Interval Note:  08/26/2019 8:40 AM  Antonio [name]  has presented today for surgery, with the diagnosis of chest pain.  The various methods of treatment have been discussed with the patient and family. After consideration of risks, benefits and other options for treatment, the patient has consented to  Procedure(s): LEFT HEART CATH AND CORS/GRAFTS ANGIOGRAPHY (N/A) as a surgical intervention.  The patient's history has been reviewed, patient examined, no change in status, stable for surgery.  I have reviewed the patient's chart and labs.  Questions were answered to the patient's satisfaction.   Cath Lab Visit (complete for each Cath Lab visit)  Clinical Evaluation Leading to the Procedure:   ACS: Yes.    Non-ACS:    Anginal Classification: CCS III  Anti-ischemic medical therapy: Maximal Therapy (2 or more classes of medications)  Non-Invasive Test Results: No non-invasive testing performed  Prior CABG: Previous CABG        Antonio [name] [facility] 08/26/2019 8:40 AM

## 2019-08-27 ENCOUNTER — Encounter (HOSPITAL_COMMUNITY)
Admission: RE | Disposition: A | Discharge status: Home / Self Care | Payer: Self-pay | Source: Home / Self Care | Attending: Cardiology

## 2019-08-27 ENCOUNTER — Other Ambulatory Visit: Payer: Self-pay

## 2019-08-27 ENCOUNTER — Encounter (HOSPITAL_COMMUNITY): Payer: Self-pay | Admitting: Cardiology

## 2019-08-27 DIAGNOSIS — I129 Hypertensive chronic kidney disease with stage 1 through stage 4 chronic kidney disease, or unspecified chronic kidney disease: Secondary | ICD-10-CM | POA: Diagnosis not present

## 2019-08-27 DIAGNOSIS — I25110 Atherosclerotic heart disease of native coronary artery with unstable angina pectoris: Secondary | ICD-10-CM | POA: Diagnosis not present

## 2019-08-27 DIAGNOSIS — I25710 Atherosclerosis of autologous vein coronary artery bypass graft(s) with unstable angina pectoris: Secondary | ICD-10-CM | POA: Diagnosis not present

## 2019-08-27 DIAGNOSIS — E1122 Type 2 diabetes mellitus with diabetic chronic kidney disease: Secondary | ICD-10-CM | POA: Diagnosis not present

## 2019-08-27 DIAGNOSIS — N182 Chronic kidney disease, stage 2 (mild): Secondary | ICD-10-CM | POA: Diagnosis not present

## 2019-08-27 HISTORY — PX: CORONARY STENT INTERVENTION: CATH118234

## 2019-08-27 LAB — CBC
HCT: 38 % — ABNORMAL LOW (ref 39.0–52.0)
Hemoglobin: 12.6 g/dL — ABNORMAL LOW (ref 13.0–17.0)
MCH: 28.1 pg (ref 26.0–34.0)
MCHC: 33.2 g/dL (ref 30.0–36.0)
MCV: 84.8 fL (ref 80.0–100.0)
Platelets: 122 10*3/uL — ABNORMAL LOW (ref 150–400)
RBC: 4.48 MIL/uL (ref 4.22–5.81)
RDW: 15.4 % (ref 11.5–15.5)
WBC: 5.5 10*3/uL (ref 4.0–10.5)
nRBC: 0 % (ref 0.0–0.2)

## 2019-08-27 LAB — BASIC METABOLIC PANEL
Anion gap: 11 (ref 5–15)
BUN: 13 mg/dL (ref 8–23)
CO2: 24 mmol/L (ref 22–32)
Calcium: 8.4 mg/dL — ABNORMAL LOW (ref 8.9–10.3)
Chloride: 103 mmol/L (ref 98–111)
Creatinine, Ser: 1.21 mg/dL (ref 0.61–1.24)
GFR calc Af Amer: >60 mL/min (ref >60)
GFR calc non Af Amer: >60 mL/min (ref >60)
Glucose, Bld: 186 mg/dL — ABNORMAL HIGH (ref 70–99)
Potassium: 3.2 mmol/L — ABNORMAL LOW (ref 3.5–5.1)
Sodium: 138 mmol/L (ref 135–145)

## 2019-08-27 LAB — POCT ACTIVATED CLOTTING TIME: Activated Clotting Time: 532 seconds

## 2019-08-27 LAB — GLUCOSE, CAPILLARY
Glucose-Capillary: 109 mg/dL — ABNORMAL HIGH (ref 70–99)
Glucose-Capillary: 76 mg/dL (ref 70–99)

## 2019-08-27 SURGERY — CORONARY STENT INTERVENTION
Anesthesia: LOCAL

## 2019-08-27 MED ORDER — LIDOCAINE HCL (PF) 1 % IJ SOLN
INTRAMUSCULAR | Status: AC
  Filled 2019-08-27: qty 30

## 2019-08-27 MED ORDER — SODIUM CHLORIDE 0.9 % IV SOLN
INTRAVENOUS | Status: DC | PRN
  Administered 2019-08-27: 10:00:00 1.75 mg/kg/h via INTRAVENOUS

## 2019-08-27 MED ORDER — FENTANYL CITRATE (PF) 100 MCG/2ML IJ SOLN
INTRAMUSCULAR | Status: AC
  Filled 2019-08-27: qty 2

## 2019-08-27 MED ORDER — SODIUM CHLORIDE 0.9 % IV SOLN: 250.0000 mL | INTRAVENOUS | Status: DC | PRN

## 2019-08-27 MED ORDER — FENTANYL CITRATE (PF) 100 MCG/2ML IJ SOLN
INTRAMUSCULAR | Status: DC | PRN
  Administered 2019-08-27: 25 ug via INTRAVENOUS

## 2019-08-27 MED ORDER — LIDOCAINE HCL (PF) 1 % IJ SOLN
INTRAMUSCULAR | Status: DC | PRN
  Administered 2019-08-27: 15 mL

## 2019-08-27 MED ORDER — POTASSIUM CHLORIDE CRYS ER 20 MEQ PO TBCR
60.0000 meq | EXTENDED_RELEASE_TABLET | Freq: Once | ORAL | Status: AC
  Administered 2019-08-27: 60 meq via ORAL
  Filled 2019-08-27: qty 3

## 2019-08-27 MED ORDER — SODIUM CHLORIDE 0.9% FLUSH: 3.0000 mL | INTRAVENOUS | Status: DC | PRN

## 2019-08-27 MED ORDER — BIVALIRUDIN TRIFLUOROACETATE 250 MG IV SOLR
INTRAVENOUS | Status: AC
  Filled 2019-08-27: qty 250

## 2019-08-27 MED ORDER — HEPARIN (PORCINE) IN NACL 1000-0.9 UT/500ML-% IV SOLN
INTRAVENOUS | Status: AC
  Filled 2019-08-27: qty 1000

## 2019-08-27 MED ORDER — SODIUM CHLORIDE 0.9 % WEIGHT BASED INFUSION
1.0000 mL/kg/h | INTRAVENOUS | Status: AC
  Administered 2019-08-27: 15:00:00 1 mL/kg/h via INTRAVENOUS

## 2019-08-27 MED ORDER — BIVALIRUDIN BOLUS VIA INFUSION - CUPID
INTRAVENOUS | Status: DC | PRN
  Administered 2019-08-27: 10:00:00 58.95 mg via INTRAVENOUS

## 2019-08-27 MED ORDER — NITROGLYCERIN 1 MG/10 ML FOR IR/CATH LAB
INTRA_ARTERIAL | Status: DC | PRN
  Administered 2019-08-27 (×2): 200 ug via INTRACORONARY

## 2019-08-27 MED ORDER — SODIUM CHLORIDE 0.9% FLUSH
3.0000 mL | Freq: Two times a day (BID) | INTRAVENOUS | Status: DC
  Administered 2019-08-27 – 2019-08-28 (×2): 3 mL via INTRAVENOUS

## 2019-08-27 MED ORDER — MIDAZOLAM HCL 2 MG/2ML IJ SOLN
INTRAMUSCULAR | Status: AC
  Filled 2019-08-27: qty 2

## 2019-08-27 MED ORDER — NITROGLYCERIN 1 MG/10 ML FOR IR/CATH LAB
INTRA_ARTERIAL | Status: AC
  Filled 2019-08-27: qty 10

## 2019-08-27 MED ORDER — MIDAZOLAM HCL 2 MG/2ML IJ SOLN
INTRAMUSCULAR | Status: DC | PRN
  Administered 2019-08-27: 2 mg via INTRAVENOUS

## 2019-08-27 MED ORDER — IOHEXOL 350 MG/ML SOLN
INTRAVENOUS | Status: DC | PRN
  Administered 2019-08-27: 11:00:00 70 mL via INTRACARDIAC

## 2019-08-27 MED ORDER — HEPARIN (PORCINE) IN NACL 1000-0.9 UT/500ML-% IV SOLN
INTRAVENOUS | Status: DC | PRN
  Administered 2019-08-27 (×2): 500 mL

## 2019-08-27 SURGICAL SUPPLY — 18 items
BALLN EMERGE MR 2.0X12 (BALLOONS) ×2
BALLN SAPPHIRE ~~LOC~~ 3.25X12 (BALLOONS) ×4 IMPLANT
BALLOON EMERGE MR 2.0X12 (BALLOONS) ×1 IMPLANT
CATH LAUNCHER 6FR AL1 (CATHETERS) ×1 IMPLANT
CATH VISTA GUIDE RCB (CATHETERS) ×2 IMPLANT
CATHETER LAUNCHER 6FR AL1 (CATHETERS) ×2
DEVICE SPIDERFX EMB PROT 3MM (WIRE) ×2 IMPLANT
KIT ENCORE 26 ADVANTAGE (KITS) ×2 IMPLANT
KIT ESSENTIALS PG (KITS) ×2 IMPLANT
KIT HEART LEFT (KITS) ×2 IMPLANT
PACK CARDIAC CATHETERIZATION (CUSTOM PROCEDURE TRAY) ×2 IMPLANT
SHEATH PINNACLE 6F 10CM (SHEATH) ×2 IMPLANT
SHEATH PROBE COVER 6X72 (BAG) ×2 IMPLANT
STENT SYNERGY DES 3X16 (Permanent Stent) ×4 IMPLANT
TRANSDUCER W/STOPCOCK (MISCELLANEOUS) ×2 IMPLANT
TUBING CIL FLEX 10 FLL-RA (TUBING) ×2 IMPLANT
WIRE ASAHI PROWATER 180CM (WIRE) ×2 IMPLANT
WIRE EMERALD 3MM-J .035X150CM (WIRE) ×2 IMPLANT

## 2019-08-27 NOTE — Progress Notes (Signed)
Site area: left groin fa sheath pulled by Inis Sizer Site Prior to Removal:  Level 0 Pressure Applied For:  25 minutes Manual:   yes Patient Status During Pull:  stable Post Pull Site:  Level 0 Post Pull Instructions Given:  yes Post Pull Pulses Present: left pt palpable Dressing Applied:  Gauze and tegaderm Bedrest begins @ 1335 Comments:

## 2019-08-27 NOTE — Progress Notes (Signed)
Progress Note  Patient Name: Antonio [name] Date of Encounter: 08/27/2019  Primary Cardiologist: Dr. Peter Martinique, MD  Subjective   Doing well.  Anticipate staged PCI today, 08/27/2019.  Denies chest pain.  Inpatient Medications    Scheduled Meds: . aspirin EC  81 mg Oral Daily  . heparin  5,000 Units Subcutaneous Q8H  . insulin aspart  0-15 Units Subcutaneous TID WC  . insulin detemir  30 Units Subcutaneous Daily  . isosorbide mononitrate  90 mg Oral Daily  . metoprolol tartrate  25 mg Oral BID  . multivitamin with minerals  1 tablet Oral Daily  . pantoprazole  40 mg Oral BID  . prasugrel  10 mg Oral Daily  . pyridOXINE  100 mg Oral Daily  . rosuvastatin  40 mg Oral Daily  . sodium chloride flush  3 mL Intravenous Q12H  . sodium chloride flush  3 mL Intravenous Q12H  . vitamin B-12  500 mcg Oral Daily  . vitamin E  200 Units Oral Daily   Continuous Infusions: . sodium chloride    . sodium chloride    . sodium chloride 1 mL/kg/hr (08/27/19 0504)   PRN Meds: sodium chloride, sodium chloride, acetaminophen, nitroGLYCERIN, ondansetron (ZOFRAN) IV, sodium chloride flush, sodium chloride flush   Vital Signs    Vitals:   08/26/19 1932 08/26/19 2131 08/26/19 2159 08/27/19 0552  BP: 127/89 123/83  96/71  Pulse: 68 69  64  Resp:      Temp:   98.5 F (36.9 C) 98.7 F (37.1 C)  TempSrc:   Oral Oral  SpO2: 98%   98%  Weight:    78.6 kg  Height:        Intake/Output Summary (Last 24 hours) at 08/27/2019 0716 Last data filed at 08/27/2019 [id number] Gross per 24 hour  Intake 1479.9 ml  Output 901 ml  Net 578.9 ml   Filed Weights   08/26/19 0713 08/27/19 0552  Weight: 79.8 kg 78.6 kg    Physical Exam   General: Well developed, well nourished, NAD Cardiovascular: RRR with S1 S2. No murmurs Abdomen: Soft, non-tender, non-distended. No obvious abdominal masses. Extremities: No edema. No clubbing or cyanosis. DP pulses 2+ bilaterally Neuro: Alert and oriented.  No focal deficits. No facial asymmetry. MAE spontaneously. Psych: Responds to questions appropriately with normal affect.    Labs    Chemistry Recent Labs  Lab 08/26/19 1249 08/27/19 0336  NA  --  138  K  --  3.2*  CL  --  103  CO2  --  24  GLUCOSE  --  186*  BUN  --  13  CREATININE 1.40* 1.21  CALCIUM  --  8.4*  GFRNONAA 51* >60  GFRAA 59* >60  ANIONGAP  --  11     Hematology Recent Labs  Lab 08/26/19 1249  WBC 4.5  RBC 4.80  HGB 13.3  HCT 40.5  MCV 84.4  MCH 27.7  MCHC 32.8  RDW 15.4  PLT 123*    Cardiac EnzymesNo results for input(s): TROPONINI in the last 168 hours. No results for input(s): TROPIPOC in the last 168 hours.   BNPNo results for input(s): BNP, PROBNP in the last 168 hours.   DDimer No results for input(s): DDIMER in the last 168 hours.   Radiology    No results found.  Telemetry    08/27/2019, NSR with infrequent PVCs- Personally Reviewed  ECG    08/27/2019-no new tracing- Personally Reviewed  Cardiac  Studies   Cardiac catheterization 08/26/2019:   Mid LAD lesion is 100% stenosed.  1st Sept lesion is 90% stenosed.  1st Mrg lesion is 100% stenosed.  2nd Mrg lesion is 100% stenosed.  Mid Cx to Dist Cx lesion is 100% stenosed.  Mid RCA to Dist RCA lesion is 100% stenosed.  Prox RCA lesion is 100% stenosed.  Seq SVG- OM1 and OM2 graft was visualized by angiography and is normal in caliber.  The graft exhibits no disease.  SVG graft was visualized by angiography.  Origin lesion is 90% stenosed.  SVG graft was visualized by angiography.  Origin lesion is 99% stenosed.  Mid Graft lesion is 40% stenosed.  LIMA graft was visualized by non-selective angiography and is normal in caliber.  The left ventricular systolic function is normal.  LV end diastolic pressure is normal.  The left ventricular ejection fraction is 55-65% by visual estimate.   1. Severe 3 vessel occlusive CAD 2. Patent LIMA to the LAD 3. Patent  SVG sequential to the OM1 and OM2 4. SVG to the diagonal with severe ostial SVG stenosis 5. SVG to the PDA with critical proximal SVG stenosis 6. Normal overall LV function 7. Normal LVEDP  Plan: recommend PCI/stenting of the SVG to diagonal and SVG to PDA. Given complexity of disease and CKD recommend admit for observation with overnight hydration and staged PCI tomorrow.   Patient Profile     73 y.o. male with PMH of CAD s/p CABG 1996, POBA of SVG to RCA in 1997, HTN, HLD, DM 2, and prior GI bleed.  Last cardiac catheterization in 2016 showed stable anatomy with patent grafts. He was admitted in 04/2017 with chest pain, Myoview was negative for ischemia.  Maintained on aspirin and Effient.  Presented to outpatient visit with Dr. Martinique 08/17/2019 with a 3-week duration of substernal pressure-like sensation and shortness of breath.  Assessment & Plan    1.  CAD: -Prior history of CABG in 1996, s/p POBA of SVG to PDA in 1997 who presented for OV visit with Dr. Martinique with increasing substernal chest pain.  Noted to be on ASA and Effient.  Also medical therapy with Imdur, metoprolol. -Plan was for elective outpatient cardiac cath for reevaluation which was performed 08/26/2019.  Recommendations were for PCI/stenting of the SVG to diagonal and SVG to PDA.  Given the complexity of disease and known CKD recommendations were for overnight hydration with staged PCI today, 08/27/2019 -Denies recurrent symptoms -Continue ASA, Imdur 90, metoprolol 25, Effient 10, Crestor  2.  Hypertension: -Stable, 123/83> 127/89 -Continue current regimen as above  3.  HLD: -Last LDL, 120 in 2018>>> repeat lab work -Continue Crestor 40 mg p.o. daily  4.  DM2: -SSI for glucose control inpatient status -Last hemoglobin A1c, 7.0>> down from 7.2 in 2016 -Managed by PCP  5.  CKD stage II: -Creatinine, 1112 2021.40>>> improved today to 1.21 -Plan for [facility] today with staged PCI  6.  Hypokalemia: -K+, 3.2 today  -Will replace with 60 mEq p.o., once   Signed, Kathyrn Drown NP-C [facility] Pager: [phone] 08/27/2019, 7:16 AM     For questions or updates, please contact   Please consult [email or web address] for contact info under Cardiology/STEMI.

## 2019-08-27 NOTE — Interval H&P Note (Signed)
History and Physical Interval Note:  08/27/2019 9:45 AM  Antonio [name]  has presented today for surgery, with the diagnosis of CAD.  The various methods of treatment have been discussed with the patient and family. After consideration of risks, benefits and other options for treatment, the patient has consented to  Procedure(s): CORONARY STENT INTERVENTION (N/A) as a surgical intervention.  The patient's history has been reviewed, patient examined, no change in status, stable for surgery.  I have reviewed the patient's chart and labs.  Questions were answered to the patient's satisfaction.   Cath Lab Visit (complete for each Cath Lab visit)  Clinical Evaluation Leading to the Procedure:   ACS: Yes.    Non-ACS:    Anginal Classification: CCS III  Anti-ischemic medical therapy: Maximal Therapy (2 or more classes of medications)  Non-Invasive Test Results: No non-invasive testing performed  Prior CABG: Previous CABG        [name] [facility] 08/27/2019 9:45 AM\

## 2019-08-28 ENCOUNTER — Encounter (HOSPITAL_COMMUNITY): Payer: Self-pay | Admitting: Cardiology

## 2019-08-28 DIAGNOSIS — I200 Unstable angina: Secondary | ICD-10-CM

## 2019-08-28 DIAGNOSIS — Z955 Presence of coronary angioplasty implant and graft: Secondary | ICD-10-CM | POA: Diagnosis not present

## 2019-08-28 DIAGNOSIS — E1159 Type 2 diabetes mellitus with other circulatory complications: Secondary | ICD-10-CM

## 2019-08-28 DIAGNOSIS — I129 Hypertensive chronic kidney disease with stage 1 through stage 4 chronic kidney disease, or unspecified chronic kidney disease: Secondary | ICD-10-CM | POA: Diagnosis not present

## 2019-08-28 DIAGNOSIS — E1169 Type 2 diabetes mellitus with other specified complication: Secondary | ICD-10-CM | POA: Diagnosis not present

## 2019-08-28 DIAGNOSIS — E1122 Type 2 diabetes mellitus with diabetic chronic kidney disease: Secondary | ICD-10-CM | POA: Diagnosis not present

## 2019-08-28 DIAGNOSIS — N182 Chronic kidney disease, stage 2 (mild): Secondary | ICD-10-CM | POA: Diagnosis not present

## 2019-08-28 DIAGNOSIS — E785 Hyperlipidemia, unspecified: Secondary | ICD-10-CM

## 2019-08-28 DIAGNOSIS — Z95820 Peripheral vascular angioplasty status with implants and grafts: Secondary | ICD-10-CM

## 2019-08-28 DIAGNOSIS — I10 Essential (primary) hypertension: Secondary | ICD-10-CM

## 2019-08-28 DIAGNOSIS — I25110 Atherosclerotic heart disease of native coronary artery with unstable angina pectoris: Secondary | ICD-10-CM | POA: Diagnosis not present

## 2019-08-28 HISTORY — DX: Peripheral vascular angioplasty status with implants and grafts: Z95.820

## 2019-08-28 LAB — BASIC METABOLIC PANEL
Anion gap: 11 (ref 5–15)
BUN: 13 mg/dL (ref 8–23)
CO2: 22 mmol/L (ref 22–32)
Calcium: 8.6 mg/dL — ABNORMAL LOW (ref 8.9–10.3)
Chloride: 105 mmol/L (ref 98–111)
Creatinine, Ser: 1.19 mg/dL (ref 0.61–1.24)
GFR calc Af Amer: >60 mL/min (ref >60)
GFR calc non Af Amer: >60 mL/min (ref >60)
Glucose, Bld: 189 mg/dL — ABNORMAL HIGH (ref 70–99)
Potassium: 3.6 mmol/L (ref 3.5–5.1)
Sodium: 138 mmol/L (ref 135–145)

## 2019-08-28 LAB — CBC
HCT: 36.4 % — ABNORMAL LOW (ref 39.0–52.0)
Hemoglobin: 12.3 g/dL — ABNORMAL LOW (ref 13.0–17.0)
MCH: 28.1 pg (ref 26.0–34.0)
MCHC: 33.8 g/dL (ref 30.0–36.0)
MCV: 83.3 fL (ref 80.0–100.0)
Platelets: 120 10*3/uL — ABNORMAL LOW (ref 150–400)
RBC: 4.37 MIL/uL (ref 4.22–5.81)
RDW: 15.1 % (ref 11.5–15.5)
WBC: 5.9 10*3/uL (ref 4.0–10.5)
nRBC: 0 % (ref 0.0–0.2)

## 2019-08-28 MED ORDER — ACETAMINOPHEN 325 MG PO TABS: 650.0000 mg | ORAL_TABLET | ORAL | Status: DC | PRN

## 2019-08-28 MED ORDER — SITAGLIP PHOS-METFORMIN HCL ER 100-1000 MG PO TB24: 1.0000 | ORAL_TABLET | Freq: Every day | ORAL | Status: DC

## 2019-08-28 NOTE — Progress Notes (Signed)
CARDIAC REHAB PHASE I   PRE:  Rate/Rhythm: Sinus 62  BP:    Sitting: 137/88     SaO2: 98%  MODE:  Ambulation: 450 ft   POST:  Rate/Rhythem: 68  BP:    Sitting: 147/95     SaO2: 95% RA [id number] Patient ambulated independently in the hallway without complaints or chest pain. Patient given heart healthy diabetic diet information exercise instructions and handouts. Has stent card. Antonio [name] says he does not think he will be interested in phase 2 cardiac rehab as he takes care of his disabled wife at home. Patient does not have a smart phone and is not interested in virtual phase 2 cardiac rehab.  Harrell Gave RN BSN

## 2019-08-28 NOTE — Care Management Obs Status (Signed)
[facility] NOTIFICATION   Patient Details  Name: MONDALE DUTCHER MRN: [id number] Date of Birth: March 18, 1946   Medicare Observation Status Notification Given: Yes      Norina Buzzard, RN 08/28/2019, 10:30 AM

## 2019-08-28 NOTE — Discharge Instructions (Signed)
Cardiac Rehabilitation What is cardiac rehabilitation? Cardiac rehabilitation is a treatment program that helps improve the health and well-being of people who have heart problems. Cardiac rehabilitation includes exercise training, education, and counseling to help you get stronger and return to an active lifestyle. This program can help you get better faster and reduce any future hospital stays. Why might I need cardiac rehabilitation? Cardiac rehabilitation programs can help when you have or have had:  A heart attack.  Heart failure.  Peripheral artery disease.  Coronary artery disease.  Angina.  Lung or breathing problems. Cardiac rehabilitation programs are also used when you have had:  Coronary artery bypass graft surgery.  Heart valve replacement.  Heart stent placement.  Heart transplant.  Aneurysm repair. What are the benefits of cardiac rehabilitation? Cardiac rehabilitation can help you:  Reduce problems like chest pain and trouble breathing.  Change risk factors that contribute to heart disease, such as: ? Smoking. ? High blood pressure. ? High cholesterol. ? Diabetes. ? Being inactive. ? Weighing over 30% more than your ideal weight. ? Diet.  Improve your emotional outlook so you feel: ? More hopeful. ? Better about yourself. ? More confident about taking care of yourself.  Get support from health experts as well as other people with similar problems.  Learn healthy ways to manage stress.  Learn how to manage and understand your medicines.  Teach your family about your condition and how to participate in your recovery. What happens in cardiac rehabilitation? You will be assessed by a cardiac rehabilitation team. They will check your health history and do a physical exam. You may need blood tests, exercise stress tests, and other evaluations to make sure that you are ready to start cardiac rehabilitation. The cardiac rehabilitation team works  with you to make a plan based on your health and goals. Your program will be tailored to fit you and your needs and may change as you progress. You may work with a health care team that includes:  Doctors.  Nurses.  Dietitians.  Psychologists.  Exercise specialists.  Physical and occupational therapists. What are the phases of cardiac rehabilitation? A cardiac rehabilitation program is often divided into phases. You advance from one phase to the next. Phase 1 This phase starts while you are still in the hospital. You may:  Start by walking in your room and then in the hall.  Do some simple exercises with a therapist.  Phase 2 This phase begins when you go home or to another facility. You will travel to a cardiac rehabilitation center or another place where rehabilitation is offered. This phase may last 8-12 weeks. During this phase:  You will slowly increase your activity level while being closely watched by a nurse or therapist.  You will have medical tests and exams to monitor your progress.  Your exercises may include strength or resistance training along with activities that cause your heart to beat faster (aerobic exercises), such as walking on a treadmill.  Your condition will determine how often and how long these sessions last.  You may learn how to: ? Lacinda Axon heart-healthy meals. ? Control your blood sugar, if this applies. ? Stop smoking. ? Manage your medicines. You may need help with scheduling or planning how and when to take your medicines. If you have questions about your medicines, it is very important that you talk with your health care provider.  Phase 3 This phase continues for the rest of your life. In this phase:  There will be less supervision.  You may continue to participate in cardiac rehabilitation activities or become part of a group in your community.  You may benefit from talking about your experience with other people who are facing similar  challenges. Follow these instructions at home:  Take over-the-counter and prescription medicines only as told by your health care provider.  Keep all follow-up visits as told by your health care provider. This is important. Get help right away if:  You have severe chest discomfort, especially if the pain is crushing or pressure-like and spreads to your arms, back, neck, or jaw. Do not wait to see if the pain will go away.  You have weakness or numbness in your face, arms, or legs, especially on one side of the body.  Your speech is slurred.  You are confused.  You have a sudden, severe headache or loss of vision.  You have shortness of breath.  You are sweating and have nausea.  You feel dizzy or faint.  You are fatigued. These symptoms may represent a serious problem that is an emergency. Do not wait to see if the symptoms will go away. Get medical help right away. Call your local emergency services (911 in the [place] Do not drive yourself to the hospital. Summary  Cardiac rehabilitation is a treatment program that helps improve the health and well-being of people who have heart problems.  A cardiac rehabilitation program is often divided into phases. You advance from one phase to the next.  The cardiac rehabilitation team works with you to make a plan based on your health and goals.  Cardiac rehabilitation includes exercise training, education, and counseling to help you get stronger and return to an active lifestyle. This information is not intended to replace advice given to you by your health care provider. Make sure you discuss any questions you have with your health care provider. Document Released: 07/09/2008 Document Revised: 01/20/2019 Document Reviewed: 07/30/2018 Elsevier Patient Education  2020 [place] at [phone] if any bleeding, swelling or drainage at cath site.  May shower, no tub baths for 48 hours for groin  sticks. No lifting over 5 pounds for 3 days.  No Driving for 3 days  Heart healthy diabetic diet   Do not take sitagliptin-metformin until 08/30/19 may interact with cath dye which will be out of your system by the 16th.    Call if any questions or concerns

## 2019-08-28 NOTE — Plan of Care (Signed)
Pt discharged to home with self care, d/c education complete, left facility via private vehicle.

## 2019-08-28 NOTE — Discharge Summary (Addendum)
Discharge Summary    Patient ID: Antonio [name] MRN: [id number]; DOB: 1946/09/03  Admit date: 08/26/2019 Discharge date: 08/28/2019  Primary Care Provider: [name], MD  Primary Cardiologist: Peter Martinique, MD  Primary Electrophysiologist:  None   Discharge Diagnoses    Principal Problem:   Unstable angina [facility]) Active Problems:   S/P angioplasty with stent-DES ostial VG to PDA and DES ostial VG to diag. 08/27/19   Diabetes ([place])   Hyperlipidemia associated with type 2 diabetes mellitus ([place])   Hypertension associated with diabetes ([place])   CAD (coronary artery disease) of bypass graft   S/P CABG (coronary artery bypass graft)    Diagnostic Studies/Procedures    Cardiac Cath 08/26/19  Mid LAD lesion is 100% stenosed.  1st Sept lesion is 90% stenosed.  1st Mrg lesion is 100% stenosed.  2nd Mrg lesion is 100% stenosed.  Mid Cx to Dist Cx lesion is 100% stenosed.  Mid RCA to Dist RCA lesion is 100% stenosed.  Prox RCA lesion is 100% stenosed.  Seq SVG- OM1 and OM2 graft was visualized by angiography and is normal in caliber.  The graft exhibits no disease.  SVG graft was visualized by angiography.  Origin lesion is 90% stenosed.  SVG graft was visualized by angiography.  Origin lesion is 99% stenosed.  Mid Graft lesion is 40% stenosed.  LIMA graft was visualized by non-selective angiography and is normal in caliber.  The left ventricular systolic function is normal.  LV end diastolic pressure is normal.  The left ventricular ejection fraction is 55-65% by visual estimate.   1. Severe 3 vessel occlusive CAD 2. Patent LIMA to the LAD 3. Patent SVG sequential to the OM1 and OM2 4. SVG to the diagonal with severe ostial SVG stenosis 5. SVG to the PDA with critical proximal SVG stenosis 6. Normal overall LV function 7. Normal LVEDP  Plan: recommend PCI/stenting of the SVG to diagonal and SVG to PDA. Given complexity of disease and CKD  recommend admit for observation with overnight hydration and staged PCI tomorrow.   PCI 08/27/19  SVG to Diagonal  Origin lesion is 90% stenosed.  A drug-eluting stent was successfully placed using a STENT SYNERGY DES 3X16.  Post intervention, there is a 0% residual stenosis.  SVG to PDA  Origin lesion is 99% stenosed.  A drug-eluting stent was successfully placed using a STENT SYNERGY DES 3X16.  Post intervention, there is a 0% residual stenosis.   1. Successful PCI of the ostial SVG to PDA with DES x 1 2. Successful PCI of the ostial SVG to diagonal with DES x 1.  Plan: DAPT for one year. Anticipate DC in am.  _____________   History of Present Illness     Antonio [name] is a 73 y.o. male with history of CABG in 1996, s/p POBA of SVG to PDA in 1997 and seen in office by Dr. Martinique with increasing substernal chest pain.  Brought in for elective cardiac cath and found to have severe 3 vessel occlusive disease, patent LIMA to LAD, patent SVG sequential to the OM1 and OM2, SVG to the diagonal with severe ostial SVG stenosis, SVG to the PDA with critical proximal SVG stenosis, normal LV function.  With need for PCI to VG to diag and VG to PAD pt brought in for hydration prior to planned procedure.    Hospital Course     Consultants: none   Pt did well overnight and underwent PCI the 13th without issues.  See above.  Now on Effient.  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  Labs stable  Along with vital signs.  Pt witll ambulate with cardiac rehab and be discharged.  Dr. Jacinta Shoe has seen and found stable for discharge.    Did the patient have an acute coronary syndrome (MI, NSTEMI, STEMI, etc) this admission?:  No                               Did the patient have a percutaneous coronary intervention (stent / angioplasty)?:  Yes.     Cath/PCI Registry Performance & Quality Measures: 1. Aspirin prescribed? - Yes 2. ADP Receptor  Inhibitor (Plavix/Clopidogrel, Brilinta/Ticagrelor or Effient/Prasugrel) prescribed (includes medically managed patients)? - Yes 3. High Intensity Statin (Lipitor 40-80mg  or Crestor 20-40mg ) prescribed? - Yes 4. For EF <40%, was ACEI/ARB prescribed? - Not Applicable (EF >/= [id number]) 5. For EF <40%, Aldosterone Antagonist (Spironolactone or Eplerenone) prescribed? - Not Applicable (EF >/= [id number]) 6. Cardiac Rehab Phase II ordered (Included Medically managed Patients)? - Yes   _____________  Discharge Vitals Blood pressure 134/78, pulse 81, temperature 98.7 F (37.1 C), temperature source Oral, resp. rate 18, height 5' 7.5" (1.715 m), weight 79 kg, SpO2 99 %.  Filed Weights   08/26/19 0713 08/27/19 0552 08/28/19 0353  Weight: 79.8 kg 78.6 kg 79 kg   General:Pleasant affect, NAD Skin:Warm and dry, brisk capillary refill HEENT:normocephalic, sclera clear, mucus membranes moist Neck:supple, no JVD, no bruits  Heart:S1S2 RRR without murmur, gallup, rub or click Lt groin cath site stable. No hematoma, + pedal pulse Lungs:clear without rales, rhonchi, or wheezes [id number], non tender, + BS, do not palpate liver spleen or masses Ext:no lower ext edema, 2+ pedal pulses, 2+ radial pulses Neuro:alert and oriented, MAE, follows commands, + facial symmetry  Labs & Radiologic Studies    CBC Recent Labs    08/27/19 0538 08/28/19 0349  WBC 5.5 5.9  HGB 12.6* 12.3*  HCT 38.0* 36.4*  MCV 84.8 83.3  PLT 122* [id number]*   Basic Metabolic Panel Recent Labs    08/27/19 0336 08/28/19 0349  NA 138 138  K 3.2* 3.6  CL 103 105  CO2 24 22  GLUCOSE 186* 189*  BUN 13 13  CREATININE 1.21 1.19  CALCIUM 8.4* 8.6*   Liver Function Tests No results for input(s): AST, ALT, ALKPHOS, BILITOT, PROT, ALBUMIN in the last 72 hours. No results for input(s): LIPASE, AMYLASE in the last 72 hours. High Sensitivity Troponin:   No results for input(s): TROPONINIHS in the last 720 hours.  BNP Invalid input(s): POCBNP  D-Dimer No results for input(s): DDIMER in the last 72 hours. Hemoglobin A1C No results for input(s): HGBA1C in the last 72 hours. Fasting Lipid Panel No results for input(s): CHOL, HDL, LDLCALC, TRIG, CHOLHDL, LDLDIRECT in the last 72 hours. Thyroid Function Tests No results for input(s): TSH, T4TOTAL, T3FREE, THYROIDAB in the last 72 hours.  Invalid input(s): FREET3 _____________  No results found. Disposition   Pt is being discharged home today in good condition.  Follow-up Plans & Appointments  Call [facility] at [phone] if any bleeding, swelling or drainage at cath site.  May shower, no tub baths for 48 hours for groin sticks. No lifting over 5 pounds for 3 days.  No Driving for 3 days  Heart healthy diabetic diet  Do not take sitagliptin-metformin until 08/30/19 may interact with cath dye which will be out of your system by the 16th.    Call if any questions or concerns    Follow-up Information    Martinique, Peter M, MD Follow up on 09/13/2019.   Specialty: Cardiology Why: at 11;00 AM iwth his nurse practitioner Dr. Jory Sims Contact information: [address] [place] [address] [phone]          Discharge Instructions    AMB Referral to Cardiac Rehabilitation - Phase II   Complete by: As directed    Diagnosis: Coronary Stents   After initial evaluation and assessments completed: Virtual Based Care may be provided alone or in conjunction with Phase 2 Cardiac Rehab based on patient barriers.: Yes      Discharge Medications   Allergies as of 08/28/2019      Reactions   Demerol Other (See Comments)   hallucinations      Medication List    STOP taking these medications   aspirin-sod bicarb-citric acid 325 MG Tbef tablet Commonly known as: ALKA-SELTZER   BC FAST PAIN RELIEF PO     TAKE these medications   acetaminophen 325 MG tablet Commonly known as: TYLENOL Take 2 tablets (650 mg total) by  mouth every 4 (four) hours as needed for headache or mild pain.   ANTI ITCH EX Apply 1 application topically daily.   aspirin EC 81 MG tablet Take 81 mg by mouth daily.   Insulin Pen Needle 32G X 4 MM Misc Commonly known as: BD Pen Needle Nano U/F 1 pen by Does not apply route daily.   isosorbide mononitrate 60 MG 24 hr tablet Commonly known as: IMDUR Take 1.5 tablets (90 mg total) by mouth daily.   Levemir FlexTouch 100 UNIT/ML Pen Generic drug: Insulin Detemir Inject 30 Units into the skin daily.   losartan-hydrochlorothiazide 100-25 MG tablet Commonly known as: HYZAAR Take 1 tablet by mouth daily.   metoprolol tartrate 25 MG tablet Commonly known as: LOPRESSOR Take 1 tablet (25 mg total) by mouth 2 (two) times daily.   multivitamin with minerals Tabs tablet Take 1 tablet by mouth daily.   nitroGLYCERIN 0.4 MG SL tablet Commonly known as: NITROSTAT place 1 tablet UNDER THE TONGUE every 5 minutes FOR 3 DOSES AS NEEDED FOR CHEST PAIN What changed:   how much to take  how to take this  when to take this  reasons to take this  additional instructions   pantoprazole 40 MG tablet Commonly known as: PROTONIX Take 1 tablet (40 mg total) by mouth 2 (two) times daily.   prasugrel 10 MG Tabs tablet Commonly known as: Effient Take 1 tablet (10 mg total) by mouth daily.   pyridOXINE 100 MG tablet Commonly known as: VITAMIN B-6 Take 100 mg by mouth daily.   rosuvastatin 40 MG tablet Commonly known as: CRESTOR Take 1 tablet (40 mg total) by mouth daily.   SitaGLIPtin-MetFORMIN HCl [phone] MG Tb24 Take 1 tablet by mouth daily. Start taking on: August 30, 2019 What changed:   when to take this  These instructions start on August 30, 2019. If you are unsure what to do until then, ask your doctor or other care provider.   vitamin B-12 500 MCG tablet Commonly known as: CYANOCOBALAMIN Take 500 mcg by mouth daily.   vitamin E 200 UNIT capsule Take 200  Units by mouth daily.          Outstanding Labs/Studies  BMP  Duration of Discharge Encounter   Greater than 30 minutes including physician time.  Signed, Cecilie Kicks, NP 08/28/2019, 8:20 AM   The patient was seen and examined, and I agree with the history, physical exam, assessment and plan as documented above.  He is doing well this morning and is without complaints.  Renal function is normal.  The importance of uninterrupted dual antiplatelet therapy was reinforced. He is stable for discharge.  Kate Sable, MD, [facility]  08/28/2019 8:30 AM

## 2019-08-31 LAB — GLUCOSE, CAPILLARY
Glucose-Capillary: 94 mg/dL (ref 70–99)
Glucose-Capillary: 181 mg/dL — ABNORMAL HIGH (ref 70–99)

## 2019-09-01 ENCOUNTER — Telehealth (HOSPITAL_COMMUNITY): Payer: Self-pay

## 2019-09-01 ENCOUNTER — Encounter (HOSPITAL_COMMUNITY): Payer: Self-pay | Admitting: Cardiology

## 2019-09-01 NOTE — Telephone Encounter (Signed)
Per ph.1 pt not interested b/c he cares for his disable wife.  Closed referral

## 2019-09-02 ENCOUNTER — Encounter (HOSPITAL_COMMUNITY): Payer: Self-pay | Admitting: Cardiology

## 2019-09-12 NOTE — Progress Notes (Deleted)
Cardiology Office Note   Date:  09/12/2019   ID:  Antonio [name], DOB 04/09/1946, MRN [id number]  PCP:  [name], MD  Cardiologist: Dr. Martinique No chief complaint on file.    History of Present Illness: Antonio [name] is a 73 y.o. male who presents for posthospitalization follow-up after admission on 08/26/2019 for unstable angina.  Antonio [name] has a history of CABG in 1996, status post p.o. BA of the SVG to PDA in 1997.  He had been seen by Dr. Martinique with increasing substernal chest pain and brought in for elective cardiac catheterization and found to have severe three-vessel occlusive disease however he did have patent LIMA to LAD, patent SVG sequential to OM1 and OM 2, SVG to diagonal with severe ostial SVG stenosis.  SVG to the PDA revealed critical proximal SVG stenosis and normal LV function.  The patient underwent PCI using DES to the VG to diagonal and DES placement to VG to the PDA, ;  after having early admission for hydration.   Antonio [name] tolerated the procedure well and was walking the next day with cardiac rehab.  Antonio [name] was doing well and walking with cardiac rehab and discharged on 08/28/2019.  He was sent home on Effient, 10 mg daily, rosuvastatin 40 mg daily, isosorbide 60 mg daily, losartan HCTZ 100/25 mg daily, and metoprolol 25 mg twice daily with a prescription for nitroglycerin sublingual as needed.  He was advised to tstop taking Alka-Seltzer and BC powders.  Past Medical History:  Diagnosis Date  . Alcohol abuse, in remission   . Arthritis   . Bleeding per rectum    "related to prostate cancer and hemorrhoids" (09/03/2012)  . BPH (benign prostatic hyperplasia)   . CAD (coronary artery disease)    a. s/p CABG 1996  b. LHC in 11/2013 w/ patent grafts. c. 12/19/14 re-look cath with patent grafts and good LVF  . Erectile dysfunction   . GERD (gastroesophageal reflux disease)   . History of lower GI bleeding   . HLD (hyperlipidemia)   . HTN  (hypertension)   . Prostate cancer ([place])    a. s/p radiation   . S/P angioplasty with stent-DES ostial VG to PDA and DES ostial VG to diag. 08/27/19 08/28/2019  . Type II diabetes mellitus ([place])     Past Surgical History:  Procedure Laterality Date  . CARDIAC CATHETERIZATION  01/27/2009   ef 60%  . CARDIAC CATHETERIZATION  08/25/2012   Severe 3v obstructive CAD, continued graft patency (SVG-D,  SVG-OM1-OM2, SVG-PDA, LIMA-LAD). area of diffuse dz in the distal LCx (up to 90%) unamenable to PCI  . CERVICAL DISC SURGERY  ? 1990's   "went in on the side" (09/03/2012)  . CORONARY ARTERY BYPASS GRAFT  1996   LIMA GRAFT TO THE LAD, SAPHENOUS VEIN GRAFT SEQUENTIALLY TO THE FIRST AND SECOND OBTUSE MARGINAL VESSELS, SAPHENOUS VEIN GRAFT TO THE DIAGONAL, AND SAPHENOUS VEIN GRAFT TO THE DISTAL RIGHT CORONARY   . CORONARY STENT INTERVENTION N/A 08/27/2019   Procedure: CORONARY STENT INTERVENTION;  Surgeon: Martinique, Peter M, MD;  Location: [place] CV LAB;  Service: Cardiovascular;  Laterality: N/A;  . ESOPHAGOGASTRODUODENOSCOPY Left 11/19/2013   Procedure: ESOPHAGOGASTRODUODENOSCOPY (EGD);  Surgeon: Arta Silence, MD;  Location: [facility] ENDOSCOPY;  Service: Endoscopy;  Laterality: Left;  . FOOT SURGERY  1980's   LEFT, "shot a nail gun thru it" (09/03/2012)  . LACERATION REPAIR  1980's   LEFT HAND  . LEFT HEART CATH AND CORS/GRAFTS ANGIOGRAPHY N/A  08/26/2019   Procedure: LEFT HEART CATH AND CORS/GRAFTS ANGIOGRAPHY;  Surgeon: Martinique, Peter M, MD;  Location: [place] CV LAB;  Service: Cardiovascular;  Laterality: N/A;  . LEFT HEART CATHETERIZATION WITH CORONARY ANGIOGRAM N/A 09/04/2012   Procedure: LEFT HEART CATHETERIZATION WITH CORONARY ANGIOGRAM;  Surgeon: Peter M Martinique, MD;  [facility] C[facility] LAB;  Service: Cardiovascular;  Laterality: N/A;  . LEFT HEART CATHETERIZATION WITH CORONARY/GRAFT ANGIOGRAM N/A 11/22/2013   Procedure: LEFT HEART CATHETERIZATION WITH Beatrix Fetters;  Surgeon:  Jettie Booze, MD;  Location: [facility] CATH LAB;  Service: Cardiovascular;  Laterality: N/A;  . LEFT HEART CATHETERIZATION WITH CORONARY/GRAFT ANGIOGRAM N/A 12/19/2014   Procedure: LEFT HEART CATHETERIZATION WITH Beatrix Fetters;  Surgeon: Peter M Martinique, MD;  Location: [facility] CATH LAB;  Service: Cardiovascular;  Laterality: N/A;     Current Outpatient Medications  Medication Sig Dispense Refill  . acetaminophen (TYLENOL) 325 MG tablet Take 2 tablets (650 mg total) by mouth every 4 (four) hours as needed for headache or mild pain.    Marland Kitchen aspirin EC 81 MG tablet Take 81 mg by mouth daily.    . Insulin Pen Needle (BD PEN NEEDLE NANO U/F) 32G X 4 MM MISC 1 pen by Does not apply route daily. 30 each 11  . isosorbide mononitrate (IMDUR) 60 MG 24 hr tablet Take 1.5 tablets (90 mg total) by mouth daily. 45 tablet 6  . LEVEMIR FLEXTOUCH 100 UNIT/ML Pen Inject 30 Units into the skin daily.   0  . losartan-hydrochlorothiazide (HYZAAR) 100-25 MG tablet Take 1 tablet by mouth daily. 90 tablet 3  . metoprolol tartrate (LOPRESSOR) 25 MG tablet Take 1 tablet (25 mg total) by mouth 2 (two) times daily. 180 tablet 3  . Multiple Vitamin (MULTIVITAMIN WITH MINERALS) TABS Take 1 tablet by mouth daily.    . nitroGLYCERIN (NITROSTAT) 0.4 MG SL tablet place 1 tablet UNDER THE TONGUE every 5 minutes FOR 3 DOSES AS NEEDED FOR CHEST PAIN (Patient taking differently: Place 0.4 mg under the tongue every 5 (five) minutes as needed for chest pain. ) 25 tablet 6  . pantoprazole (PROTONIX) 40 MG tablet Take 1 tablet (40 mg total) by mouth 2 (two) times daily. 60 tablet 11  . Pramoxine-Camphor-Zinc Acetate (ANTI ITCH EX) Apply 1 application topically daily.    . prasugrel (EFFIENT) 10 MG TABS tablet Take 1 tablet (10 mg total) by mouth daily. 90 tablet 3  . pyridOXINE (VITAMIN B-6) 100 MG tablet Take 100 mg by mouth daily.    . rosuvastatin (CRESTOR) 40 MG tablet Take 1 tablet (40 mg total) by mouth daily. 90 tablet 3  .  SitaGLIPtin-MetFORMIN HCl [phone] MG TB24 Take 1 tablet by mouth daily.    . vitamin B-12 (CYANOCOBALAMIN) 500 MCG tablet Take 500 mcg by mouth daily.    . vitamin E 200 UNIT capsule Take 200 Units by mouth daily.     No current facility-administered medications for this visit.     Allergies:   Demerol    Social History:  The patient  reports that he quit smoking about 16 years ago. His smoking use included cigarettes. He has a 37.00 pack-year smoking history. He has never used smokeless tobacco. He reports current alcohol use. He reports that he does not use drugs.   Family History:  The patient's family history includes CAD in his brother; Cancer in his mother and other family members; Diabetes in an other family member; Hypertension in his father, mother, and another family member.  ROS: All other systems are reviewed and negative. Unless otherwise mentioned in H&P    PHYSICAL EXAM: VS:  There were no vitals taken for this visit. , BMI There is no height or weight on file to calculate BMI. GEN: Well nourished, well developed, in no acute distress HEENT: normal Neck: no JVD, carotid bruits, or masses Cardiac: ***RRR; no murmurs, rubs, or gallops,no edema  Respiratory:  Clear to auscultation bilaterally, normal work of breathing GI: soft, nontender, nondistended, + BS MS: no deformity or atrophy Skin: warm and dry, no rash Neuro:  Strength and sensation are intact Psych: euthymic mood, full affect   EKG:  EKG {ACTION; IS/IS [id number] ordered today. The ekg ordered today demonstrates ***   Recent Labs: 08/17/2019: ALT 20 08/28/2019: BUN 13; Creatinine, Ser 1.19; Hemoglobin 12.3; Platelets 120; Potassium 3.6; Sodium 138    Lipid Panel    Component Value Date/Time   CHOL 124 08/17/2019 1109   TRIG 76 08/17/2019 1109   HDL 41 08/17/2019 1109   CHOLHDL 3.0 08/17/2019 1109   CHOLHDL 5.0 05/08/2017 0214   VLDL 29 05/08/2017 0214   LDLCALC 68 08/17/2019 1109       Wt Readings from Last 3 Encounters:  08/28/19 174 lb 3.2 oz (79 kg)  08/17/19 177 lb (80.3 kg)  08/27/18 178 lb (80.7 kg)      Other studies Reviewed: Cardiac Cath 08/26/19  Mid LAD lesion is 100% stenosed.  1st Sept lesion is 90% stenosed.  1st Mrg lesion is 100% stenosed.  2nd Mrg lesion is 100% stenosed.  Mid Cx to Dist Cx lesion is 100% stenosed.  Mid RCA to Dist RCA lesion is 100% stenosed.  Prox RCA lesion is 100% stenosed.  Seq SVG- OM1 and OM2 graft was visualized by angiography and is normal in caliber.  The graft exhibits no disease.  SVG graft was visualized by angiography.  Origin lesion is 90% stenosed.  SVG graft was visualized by angiography.  Origin lesion is 99% stenosed.  Mid Graft lesion is 40% stenosed.  LIMA graft was visualized by non-selective angiography and is normal in caliber.  The left ventricular systolic function is normal.  LV end diastolic pressure is normal.  The left ventricular ejection fraction is 55-65% by visual estimate.  1. Severe 3 vessel occlusive CAD 2. Patent LIMA to the LAD 3. Patent SVG sequential to the OM1 and OM2 4. SVG to the diagonal with severe ostial SVG stenosis 5. SVG to the PDA with critical proximal SVG stenosis 6. Normal overall LV function 7. Normal LVEDP  Plan: recommend PCI/stenting of the SVG to diagonal and SVG to PDA. Given complexity of disease and CKD recommend admit for observation with overnight hydration and staged PCI tomorrow.   PCI 08/27/19  SVG to Diagonal  Origin lesion is 90% stenosed.  A drug-eluting stent was successfully placed using a STENT SYNERGY DES 3X16.  Post intervention, there is a 0% residual stenosis.  SVG to PDA  Origin lesion is 99% stenosed.  A drug-eluting stent was successfully placed using a STENT SYNERGY DES 3X16.  Post intervention, there is a 0% residual stenosis.  1. Successful PCI of the ostial SVG to PDA with DES x 1 2. Successful PCI of  the ostial SVG to diagonal with DES x 1.  Plan: DAPT for one year. Anticipate DC in am.  ASSESSMENT AND PLAN:  1.  ***   Current medicines are reviewed at length with the patient today.    Labs/ tests  ordered today include: *** Phill Myron. [place] Pugh, ANP, AACC   09/12/2019 3:57 PM    [place] [address] Office [phone] Fax [phone]  Notice: This dictation was prepared with Dragon dictation along with smaller phrase technology. Any transcriptional errors that result from this process are unintentional and may not be corrected upon review.

## 2019-09-13 ENCOUNTER — Ambulatory Visit: Payer: Medicare Other | Admitting: Adult Health
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
## 2019-11-01 ENCOUNTER — Encounter: Payer: Self-pay | Admitting: Adult Health

## 2019-11-01 ENCOUNTER — Ambulatory Visit: Payer: Medicare Other | Admitting: Adult Health

## 2019-11-01 ENCOUNTER — Other Ambulatory Visit: Payer: Self-pay

## 2019-11-01 VITALS — BP 142/94 | HR 75 | Ht 67.5 in | Wt 176.6 lb

## 2019-11-01 DIAGNOSIS — E1169 Type 2 diabetes mellitus with other specified complication: Secondary | ICD-10-CM | POA: Diagnosis not present

## 2019-11-01 DIAGNOSIS — I10 Essential (primary) hypertension: Secondary | ICD-10-CM | POA: Diagnosis not present

## 2019-11-01 DIAGNOSIS — E785 Hyperlipidemia, unspecified: Secondary | ICD-10-CM

## 2019-11-01 DIAGNOSIS — I2510 Atherosclerotic heart disease of native coronary artery without angina pectoris: Secondary | ICD-10-CM | POA: Diagnosis not present

## 2019-11-01 DIAGNOSIS — E119 Type 2 diabetes mellitus without complications: Secondary | ICD-10-CM

## 2019-11-01 DIAGNOSIS — Z794 Long term (current) use of insulin: Secondary | ICD-10-CM

## 2019-11-01 MED ORDER — TRAMADOL HCL 50 MG PO TABS: 50.0000 mg | ORAL_TABLET | Freq: Three times a day (TID) | ORAL | 1 refills | Status: DC | PRN

## 2019-11-01 MED ORDER — ISOSORBIDE MONONITRATE ER 60 MG PO TB24: 90.0000 mg | ORAL_TABLET | Freq: Every day | ORAL | 6 refills | Status: DC

## 2019-11-01 NOTE — Patient Instructions (Signed)
Medication Instructions:  Your physician recommends that you continue on your current medications as directed. Please refer to the Current Medication list given to you today.  *If you need a refill on your cardiac medications before your next appointment, please call your pharmacy*  Lab Work: PLEASE HAVE YOUR PRIMARY CARE PROVIDER'S OFFICE FAX THE RESULTS OF YOUR UPCOMING LAB WORK TO [phone] If you have labs (blood work) drawn today and your tests are completely normal, you will receive your results only by: Marland Kitchen MyChart Message (if you have MyChart) OR . A paper copy in the mail If you have any lab test that is abnormal or we need to change your treatment, we will call you to review the results.  Testing/Procedures: NONE  Follow-Up: At [facility], you and your health needs are our priority.  As part of our continuing mission to provide you with exceptional heart care, we have created designated Provider Care Teams.  These Care Teams include your primary Cardiologist (physician) and Advanced Practice Providers (APPs -  Physician Assistants and Nurse Practitioners) who all work together to provide you with the care you need, when you need it.  Your next appointment:   4 month(s)  The format for your next appointment:   In Person  Provider:    You may see Peter Martinique, MD or Jory Sims, DNP   Other Instructions  Heart-Healthy Eating Plan Heart-healthy meal planning includes:  Eating less unhealthy fats.  Eating more healthy fats.  Making other changes in your diet. Talk with your doctor or a diet specialist (dietitian) to create an eating plan that is right for you. What is my plan? Your doctor may recommend an eating plan that includes:  Total fat: ______% or less of total calories a day.  Saturated fat: ______% or less of total calories a day.  Cholesterol: less than _________mg a day. What are tips for following this plan? Cooking Avoid frying your  food. Try to bake, boil, grill, or broil it instead. You can also reduce fat by:  Removing the skin from poultry.  Removing all visible fats from meats.  Steaming vegetables in water or broth. Meal planning   At meals, divide your plate into four equal parts: ? Fill one-half of your plate with vegetables and green salads. ? Fill one-fourth of your plate with whole grains. ? Fill one-fourth of your plate with lean protein foods.  Eat 4-5 servings of vegetables per day. A serving of vegetables is: ? 1 cup of raw or cooked vegetables. ? 2 cups of raw leafy greens.  Eat 4-5 servings of fruit per day. A serving of fruit is: ? 1 medium whole fruit. ?  cup of dried fruit. ?  cup of fresh, frozen, or canned fruit. ?  cup of 100% fruit juice.  Eat more foods that have soluble fiber. These are apples, broccoli, carrots, beans, peas, and barley. Try to get 20-30 g of fiber per day.  Eat 4-5 servings of nuts, legumes, and seeds per week: ? 1 serving of dried beans or legumes equals  cup after being cooked. ? 1 serving of nuts is  cup. ? 1 serving of seeds equals 1 tablespoon. General information  Eat more home-cooked food. Eat less restaurant, buffet, and fast food.  Limit or avoid alcohol.  Limit foods that are high in starch and sugar.  Avoid fried foods.  Lose weight if you are overweight.  Keep track of how much salt (sodium) you eat. This  is important if you have high blood pressure. Ask your doctor to tell you more about this.  Try to add vegetarian meals each week. Fats  Choose healthy fats. These include olive oil and canola oil, flaxseeds, walnuts, almonds, and seeds.  Eat more omega-3 fats. These include salmon, mackerel, sardines, tuna, flaxseed oil, and ground flaxseeds. Try to eat fish at least 2 times each week.  Check food labels. Avoid foods with trans fats or high amounts of saturated fat.  Limit saturated fats. ? These are often found in animal  products, such as meats, butter, and cream. ? These are also found in plant foods, such as palm oil, palm kernel oil, and coconut oil.  Avoid foods with partially hydrogenated oils in them. These have trans fats. Examples are stick margarine, some tub margarines, cookies, crackers, and other baked goods. What foods can I eat? Fruits All fresh, canned (in natural juice), or frozen fruits. Vegetables Fresh or frozen vegetables (raw, steamed, roasted, or grilled). Green salads. Grains Most grains. Choose whole wheat and whole grains most of the time. Rice and pasta, including brown rice and pastas made with whole wheat. Meats and other proteins Lean, well-trimmed beef, veal, pork, and lamb. Chicken and [place] without skin. All fish and shellfish. Wild duck, rabbit, pheasant, and venison. Egg whites or low-cholesterol egg substitutes. Dried beans, peas, lentils, and tofu. Seeds and most nuts. Dairy Low-fat or nonfat cheeses, including ricotta and mozzarella. Skim or 1% milk that is liquid, powdered, or evaporated. Buttermilk that is made with low-fat milk. Nonfat or low-fat yogurt. Fats and oils Non-hydrogenated (trans-free) margarines. Vegetable oils, including soybean, sesame, sunflower, olive, peanut, safflower, corn, canola, and cottonseed. Salad dressings or mayonnaise made with a vegetable oil. Beverages Mineral water. Coffee and tea. Diet carbonated beverages. Sweets and desserts Sherbet, gelatin, and fruit ice. Small amounts of dark chocolate. Limit all sweets and desserts. Seasonings and condiments All seasonings and condiments. The items listed above may not be a complete list of foods and drinks you can eat. Contact a dietitian for more options. What foods should I avoid? Fruits Canned fruit in heavy syrup. Fruit in cream or butter sauce. Fried fruit. Limit coconut. Vegetables Vegetables cooked in cheese, cream, or butter sauce. Fried vegetables. Grains Breads that are made with  saturated or trans fats, oils, or whole milk. Croissants. Sweet rolls. Donuts. High-fat crackers, such as cheese crackers. Meats and other proteins Fatty meats, such as hot dogs, ribs, sausage, bacon, rib-eye roast or steak. High-fat deli meats, such as salami and bologna. Caviar. Domestic duck and goose. Organ meats, such as liver. Dairy Cream, sour cream, cream cheese, and creamed cottage cheese. Whole-milk cheeses. Whole or 2% milk that is liquid, evaporated, or condensed. Whole buttermilk. Cream sauce or high-fat cheese sauce. Yogurt that is made from whole milk. Fats and oils Meat fat, or shortening. Cocoa butter, hydrogenated oils, palm oil, coconut oil, palm kernel oil. Solid fats and shortenings, including bacon fat, salt pork, lard, and butter. Nondairy cream substitutes. Salad dressings with cheese or sour cream. Beverages Regular sodas and juice drinks with added sugar. Sweets and desserts Frosting. Pudding. Cookies. Cakes. Pies. Milk chocolate or white chocolate. Buttered syrups. Full-fat ice cream or ice cream drinks. The items listed above may not be a complete list of foods and drinks to avoid. Contact a dietitian for more information. Summary  Heart-healthy meal planning includes eating less unhealthy fats, eating more healthy fats, and making other changes in your diet.  Eat  a balanced diet. This includes fruits and vegetables, low-fat or nonfat dairy, lean protein, nuts and legumes, whole grains, and heart-healthy oils and fats. This information is not intended to replace advice given to you by your health care provider. Make sure you discuss any questions you have with your health care provider. Document Revised: 12/04/2017 Document Reviewed: 11/07/2017 Elsevier Patient Education  2020 [employer].

## 2019-12-22 ENCOUNTER — Ambulatory Visit: Payer: Self-pay

## 2019-12-22 ENCOUNTER — Other Ambulatory Visit: Payer: Self-pay

## 2019-12-22 NOTE — Patient Outreach (Signed)
[facility] Evaluation Interviewer made contact with patient. Aging Gracefully survey completed.   Interviewer will send referral to Laine Tousey, RN and OT for follow up.   Gerlene Glassburn Care Management Assistant [phone] 

## 2020-01-18 ENCOUNTER — Other Ambulatory Visit: Payer: Self-pay

## 2020-01-18 ENCOUNTER — Other Ambulatory Visit: Payer: Self-pay | Admitting: Occupational Therapy

## 2020-01-18 NOTE — Patient Outreach (Signed)
Aging Gracefully Program  OT Initial Visit  01/18/2020  Antonio [name] [birth year] [id number]  Visit:  1- Initial Visit  Start Time:  1300 End Time:  1350 Total Minutes:  37  CCAP: Typical Daily Routine: Typical Daily Routine:: Pt is independent with functional mobility and community mobility. His typical routine involves caring for his wife. He manages the meals and cleaning. Pt also does the driving to medical appointments. He enjoys going out to stores and organizing up his back yard as a project What [place] Having Throughout The Day?: Energy Conservation, caregiver burden What Kind Of Help Do You Receive?: Some assist from family members Do You Think You Need Other Types Of Help?: Assist with home repair, equipment recommendations What Do You Think Would Make Everyday Life Easier For You?: Walk in shower, higher commodes, hole in ceiling fixed What Is A Good Day Like?: Going out to the store, cleaning up in the yard, seeing family What Is A Bad Day Like?: Resting in side, shortness of breath Do You Have Time For Yourself?: Yes Patient Reported Equipment: Patient Reported Equipment Currently Used: (Not currently using) Functional Mobility-Walking Indoors/Getting Around the House:   Functional Mobility-Walk A Block: Walk A Block: A Little Difficulty Do You:: No Device/No Assistance Importance Of Learning New Strategies:: Not At All Functional Mobility-Maintain Balance While Showering: Maintaining Balance While Showering: A Little Difficulty Do You:: No Device/No Assistance Importance Of Learning New Strategies:: Very Much Observation: Maintain Balance While Showering: Independent With Pain, Difficulty, Or Use Of Device Safety: A Little Risk Efficiency: Somewhat Intervention: Yes Functional Mobility-Stooping, Crouching, Kneeling To Retreive Item: Stooping, Crouching, or Kneeling To Retrieve Item: A Little Difficulty Do You:: No Device/No  Assistance Importance Of Learning New Strategies:: Not At All Functional Mobility-Bending From Standing Position To Pick Up Clothing Off The Floor: Bending Over From Standing Position To Pick Up Clothing Off The Floor: A Little Difficulty Do You:: No Device/No Assistance Importance Of Learning New Strategies:: Not At All Functional Mobility-Reaching For Items Above Shoulder Level: Reaching For Items Above Shoulder Level: A Little Difficulty Do You:: No Device/No Assistance Importance Of Learning New Strategies:: Not At All Functional Mobility-Climb 1 Flight Of Stairs: Climb 1 Flight Of Stairs: A Little Difficulty Do You:: No Device/No Assistance Importance Of Learning New Strategies:: Not At All Functional Mobility-Move In And Out Of Chair: Move In and Out Of A Chair: No Difficulty Do You:: No Device/No Assistance Importance Of Learning New Strategies:: Not At All Functional Mobility-Move In And Out Of Bed: Move In and Out Of Bed: No Difficulty Do You:: No Device/No Assistance Importance Of Learning New Strategies:: Not At All Functional Mobility-Move In And Out Of Bath/Shower: Move In And Out Of A Bath/Shower: A Little Difficulty Do You:: No Device/No Assistance Importance Of Learning New Strategies:: Very Much Other Comments:: difficulty stepping over tub shower Observation: Move In And Out Of Bath/Shower: Independent With Pain, Difficulty, Or Use Of Device Safety: A Little Risk Efficiency: Somewhat Intervention: Yes Functional Mobility-Get On And Off Toilet: Getting Up From The Floor: A Little Difficulty Do You:: Use Personal Assistance Importance Of Learning New Strategies:: Not At All Functional Mobility-Into And Out Of Car, Not Including Driving: Into  And Out Of Car, Not Including Driving: No Difficulty Do You:: No Device/No Assistance Importance Of Learning New Strategies:: Not At All Functional Mobility-Other Mobility Difficulty:      Activities of Daily  Living-Bathing/Showering: ADL-Bathing/Showering: No Difficulty Do You:: No Device/No Assistance Importance  Of Learning New Strategies: Not At All Activities of Daily Living-Personal Hygiene and Grooming: Personal Hygiene and Grooming: No Difficulty Do You:: No Device/No Assistance Importance Of Learning New Strategies: Not At All Activities of Daily Living-Toilet Hygiene: Toilet Hygiene: No Difficulty Do You:: No Device/No Assistance Importance Of Learning New Strategies: Not At All Activities of Daily Living-Put On And Take Off Undergarments (Incl. Fasteners): Put On And Take Off Undergarments (Incl. Fasteners): No Difficulty Do You:: No Device/No Assistance Importance Of Learning New Strategies: Not At All Activities of Daily Living-Put On And Take Off Shirt/Dress/Coat (Incl. Fasteners): Put On And Take Off Shirt/Dress/Coat (Incl. Fasteners): No Difficulty Do You:: No Device/No Assistance Importance Of Learning New Strategies: Not At All Activities of Daily Living-Put On And Take Off Socks And Shoes: Put On And Take Off Socks And  Shoes: A Little Difficulty Do You:: No Device/No Assistance Importance Of Learning New Strategies: Not At All Activities of Daily Living-Feed Self: Feed Self: No Difficulty Do You:: No Device/No Assistance Importance Of Learning New Strategies: Not At All Activities of Daily Living-Rest And Sleep: Rest and Sleep: A Little Difficulty Do You:: No Device/No Assistance Importance Of Learning New Strategies: A Little Other Comments:: lack of sleep due to living conditions (hole in ceiling) ADL Observation: Rest And Sleep: Independent With Pain, Difficulty, Or Use Of Device Safety: Extreme Risk Efficiency: Not At All Intervention: Yes Other Comments:: replace roof Activities of Daily Living-Sexual Activity: Sexual  Activity: A Little Difficulty Do You:: No Device/No Assistance Importance Of Learning New Strategies: Not At All Activities of Daily  Living-Other Activity Identified:    Instrumental Activities of Daily Living-Light Homemaking (Laundry, Straightening Up, Vacuuming):  Do Light Homemaking (Laundry, Straightening Up, Vacuuming): A Little Difficulty Do You:: No Device/No Assistance Importance Of Learning New Strategies: Not At All Other Comments:: will have assist from children/grandchildren as needed Instrumental Activities of Daily Living-Making A Bed: Making a Bed: No Difficulty Do You:: No Device/No Assistance Importance Of Learning New Strategies: Not At All Instrumental Activities of Daily Living-Washing Dishes By Hand While Standing At The Sink: Washing Dishes By Hand While Standing At The Sink: A Little Difficulty Do You:: No Device/No Assistance Importance Of Learning New Strategies: A Little Other Comments:: may benefit from stool for ECS IADL Observation: Washing Dishes By Hand While Standing At The Sink: Independent With Pain, Difficulty, Or Use Of Device Safety: No Risk Efficiency: Somewhat Instrumental Activities of Daily Living-Grocery Shopping: Do Grocery Shopping: A Little Difficulty Do You:: Use Personal Assistance(as needed from family) Importance Of Learning New Strategies: Not At All Instrumental Activities of Daily Living-Use Telephone: Use Telephone: No Difficulty Do You:: No Device/No Assistance Importance Of Learning New Strategies: Not At All Instrumental Activities of Daily Living-Financial Management: Financial Management: No Difficulty Do You:: No Device/No Assistance Importance Of Learning New Strategies: Not At All Other Comments:: joint effort between pt and spouse Instrumental Activities of Daily Living-Medications: Take Medications: No Difficulty Do You:: No Device/No Assistance Importance Of Learning New Strategies: Not At All Instrumental Activities of Daily Living-Health Management And Maintenance: Health Management & Maintenance: No Difficulty Do You:: No Device/No  Assistance Importance Of Learning New Strategies: Not At All Instrumental Activities of Daily Living-Meal Preparation and Clean-Up: Meal Preparation and Clean-Up: A Little Difficulty Do You:: Use Personal Assistance Importance Of Learning New Strategies: A Little Other Comments:: decreased energy, uses assist as needed from children Instrumental Activities of Daily Living-Provide Care For Others/Pets: Care For Others/Pets: Moderate Difficulty Do You:: Use Personal  Assistance Importance Of Learning New Strategies: Moderate Other Comments:: caring for wife IADL Observation: Provide Care For Others/Pets: Minimal Assistance Safety: A Little Risk Efficiency: Somewhat Intervention: Yes Other Comments:: strategies to reduce caregiver burden Instrumental Activities of Daily Living-Take Part In Organized Social Activities: Take Part In Organized Social Activities: N/A(pt not wanting to explore due to [place] concerns) Instrumental Activities of Daily Living-Leisure Participation: Leisure Participation: No Difficulty Do You:: No Device/No Assistance Importance Of Learning New Strategies: Not At All Instrumental Activities of Daily Living-Employment/Volunteer Activities: Employment/Volunteer Activities: N/A Instrumental Activities of Daily Living-Other Identifies: Other IADL Identified: Cleaning up yard Other IADL Identified Difficulty: Moderate Difficulty Do You:: Use Personal Assistance Importance Of Learning New Strategies:: A Little Other IADL Comments:: decreased energy IADL Observation: Other Identified: Supervision Safety: A Little Risk Efficiency: Somewhat Intervention: Yes Other Comments:: ECS strategies when engaging in yard work  Readiness To Change Score:  Readiness to Change Score: 9  Home Environment Assessment: Outside Home Entry:: no rails Dining Room:: cluttered Kitchen:: cluttered Bathroom:: hole in ceiling, small quarters, one toilet not functioning Master Bedroom::  large hole in ceiling Smoke/CO2 Detector:: chirping throughout house Other Home Environment Concerns:: largest concern is large holes in ceiling covered with tarps  Durable Medical Equipment:    Patient Education: Education Provided: Yes Education Details: established education and care plan with AG visits Person(s) Educated: Patient, Child(ren), Spouse Comprehension: Verbalized Understanding  Goals: Goals Addressed            This Visit's Progress   . Patient Stated       Pt will improve ECS strategies with preferred IADL activities    . Patient Stated       Pt will reduce caregiver burden with adaptive strategies, education and environment changes     . Patient Stated       Patient will improve safety with toilet and shower transfers    . Patient Stated       Patient will improve rest and sleep with necessary environmental and home safety modifications       Post Clinical Reasoning: Clinician View Of Client Situation:: In need of home repair and accessibility changes Client View Of His/Her Situation:: hopeful, thankful, and ready to change Next Visit Plan:: address ECS    Zenovia Jarred, Kemp, OTR/L [facility] Office Number: [phone] Pager: [phone]

## 2020-01-18 NOTE — Patient Instructions (Signed)
Goals    . Patient Stated     Pt will improve ECS strategies with preferred IADL activities    . Patient Stated     Pt will reduce caregiver burden with adaptive strategies, education and environment changes     . Patient Stated     Patient will improve safety with toilet and shower transfers    . Patient Stated     Patient will improve rest and sleep with necessary environmental and home safety modifications

## 2020-01-27 ENCOUNTER — Other Ambulatory Visit: Payer: Self-pay | Admitting: *Deleted

## 2020-01-27 NOTE — Patient Outreach (Signed)
Aging Gracefully Program  01/27/2020  Antonio [name] 20-Jul-1946 [id number]  Placed call to client Antonio [name] to schedule initial Aging Gracefully RN home visit; HIPAA/ identity verified.     Visit scheduled with client for Thursday February 10, 2020 at 11:30 am per patient preference; verified patient's address and explained that I would contact him by phone on day of scheduled visit prior to arriving at her home, to complete coronavirus questionnaire screening tool; confirmed that patient has a mask available at (her) home for use during scheduled visit, and understands to have his medications with him for review during scheduled visit.   Provided my direct contact information to patient, should he wish to contact me prior to scheduled home visit.   Plan:   Scheduled RN initial home visit on Thursday February 10, 2020  Oneta Rack, RN, BSN, [place] [facility]  [phone]

## 2020-02-10 ENCOUNTER — Encounter: Payer: Self-pay | Admitting: *Deleted

## 2020-02-10 ENCOUNTER — Other Ambulatory Visit: Payer: Self-pay

## 2020-02-10 ENCOUNTER — Other Ambulatory Visit: Payer: Self-pay | Admitting: *Deleted

## 2020-02-10 NOTE — Patient Outreach (Signed)
Aging Gracefully Program  RN Visit # 1  02/10/2020  Antonio [name] 11/25/1945 [id number]  Visit:   RN Visit # 1  Aging Gracefully RN Visit # 1: [id number] screening completed by telephone prior to visit at patient's home; during in-home visit, patient in mask and RN with PPE in place: mask, eye covering; patient temperature at RN arrival: 98.1 social distancing maintained > 6 feet throughout entirety of home visit  Start Time:   11:30 am End Time:   12:45 pm Total Minutes:   75 minutes  Readiness To Change Score:  Readiness to Change Score: 9  Universal RN Interventions: Calendar Distribution: Yes Exercise Review: Yes Medications: Yes Medication Changes: No(None indicated today) Mood: Yes Pain: Yes PCP Advocacy/Support: Yes Fall Prevention: Yes Incontinence: Yes  Healthcare Provider Communication: Did Higher education careers adviser With [place] Provider?: No  Clinician View of Client Situation: Clinician View Of Client Situation: Antonio [name] is independent in all self-care activities and is the primary caregiver for his wife; he verbalizes a good general understanding of his medications and reports a good relationship with his PCP.  He has controlled DM and would like to some day be able to stop using insulin.  Antonio [name] appears motivated to taking steps to remain in his current or better state of health as he ages.   Client's View of His/Her Situation: Client View Of His/Her Situation: "I think I am doing okay but I wish I could get off of some of these medications; I try to do everything the doctor tells me to, and am able to keep doing what he tells me to; I just wish I didn't need so many medications.  I am looking forward to nicer weather so we can try to start walking and exercising some.  I want to stay as healthy as I can."  Medication Assessment: Do You Have Any Problems Paying For Medications?: Yes(reports receives "Extra Help" patient assistance with medications) Where Does Client  Store Medications?: Other:(night stand/ in cooler pack) Can Client Read Pill Bottles?: Yes Does Client Use A Pillbox?: No(provided today and instructed in use) Does Anyone Assist Client In Taking Medications?: No Do You Take Vitamin D?: No(Discussed value of discussing need for Vitamin D with PCP) Total Number Of Medications That The Client Takes: 14 Does Client Have Any Questions Or Concerns About Medictions?: No Is Client Complaining Of Any Symptoms That Could Be Side Effects To Medications?: No Any Possible Changes In Medication Regimen?: No(Client would like to eventually take less medication; encouraged client to discuss medicines with PCP at upcoming scheduled office visit in May 2021)  OT Update: Will update Aging Gracefully team on today's successful completion of RN Visit # 1  Session Summary: Pleasant visit with client and his wife, who is also participating in [place] program; visit took place in client's home where he and his spouse are not currently residing, however, their daughter is staying there temporarily; the environment is noted to have multiple fall hazards, including clutter on furniture and floor/ children's toys scattered throughout home, and unsecure throw rugs in places.  Client and his spouse are looking forward to having home modifications completed and client seems engaged and motivated in discussing his Aging Gracefully goals.  Oneta Rack, RN, BSN, [employer] [facility]  [phone]

## 2020-02-10 NOTE — Patient Instructions (Addendum)
Marcello Moores, I enjoyed meeting you and Doretha Sou today!  Please see the summary of our visit below, and be thinking about the strategies we discussed around your medications and medication management; I hope you have a productive visit with your doctor in May and are able to talk about your medicines with him then   I have scheduled my next visit with you and Doretha Sou as a phone call on Thursday Mar 09, 2020-- I will call you between 11:00- 11:30 am  If you think of any new strategies, please write them down so we can talk about them when I call you    CLIENT/RN ACTION PLAN - MEDICATIONS  Registered Nurse:  Reginia Naas RN Date: 02/10/20  Client Name:[place] Client ID: [id number]   Target Area:  MEDICATIONS   Why Problem May Occur: Client is on insulin for diabetes, takes blood pressure medications, and may need to use nitroglycerine   Target Goal: Client would like to consolidate and simplify/ possibly decrease the amount of medications he takes on a daily basis   STRATEGIES Medication Problem Ideas  Wrong Medicine . Read the label on the pill bottle each time you take a medicine. Check the name, time, and amount of medication that you are supposed to take. Marland Kitchen Keep a list of your medicines.  Look at the list carefully when filling pill box and taking a medicines. .   Can't See the Medicine Label . Ask your pharmacy to put labels with large print on the medicine bottle. . Use a magnifying glass or reading glasses to read the labels. . Get your vision checked. Although you are able to see the writing on your prescriptions bottles, regular eye exams are important when you have Diabetes, to make sure you are not experiencing complications   Too Much Over the Counter Medicine-- N/A . Diphenhydramine (Benadryl), Ibuprofen (Motrin) and Acetaminophen (Tylenol) are ingredients in many over the counter medications but are hard to find on the label.  For example, Benadryl is in Tylenol PM. . Read the  label carefully or ask the pharmacist what is in the medicine you want to buy. .   Changes in Medicine- be sure to discuss your medications with your doctor when you visit with him next month . Bring your medicine list each time you see a Acupuncturist. Marland Kitchen Update the list of your medications every time you see a Acupuncturist. .   Side Effects or Interactions . Tell your Primary Care Provider about all new medicines that you start. . Foods, herbs, and vitamins can change how medications work. . Tell your Healthcare Provider about any side effects you may be having; because you are on blood pressure medications, NTG, and insulin, it is important for your doctor to know if you experience dizziness  . Write down your blood pressures and blood sugars at home to take with you to doctor's appointments- it will help your doctor decide if you are on the right medications  Forget to Take Medicines . Try using the pillbox provided to you today. . Have a family member help you fill your pillbox. . Have a family member call to remind you to take your medicine. .   Fall or Feel Dizzy- this side effect could occur with your blood pressure medication, the NTG, and the insulin: if you start feeling dizziness or lightheadedness, it is important to tell your doctor right away . Many medications can make you dizzy or sleepy and increase your risk of  falling. . Your Aging Gracefully Nurse will look at your medications and see if you are taking any that cause falling. . Tell your Primary Care Provider if you feel dizzy or fall down.  It could be from a medicine you are taking.  .   Suddenly Stop Medicine . Many medications have side effects if stopped suddenly.   . Get your medications refilled before you run out. . Talk to your Healthcare Provider before stopping any medications. .   Pills Hard to Swallow- N/A . Take pills with milk, applesauce, or yogurt. . Ask your pharmacist if you can crush or cut  your pills. . Ask if your medication comes in a smaller size pill or liquid. .   Other Problems: 1. 2. 3. 4. 5. Other strategies: 1. 2. 3. 4. 5.    PRACTICE It is important to practice the strategies so we can determine if they will be effective in helping to reach the goal.    Follow these specific recommendations:  Ask your doctor about your current medications when you visit in May 2021: -- should I be taking Vitamin D/ calcium to help my bones stay strong? -- show your doctor your home blood pressures and blood sugars and ask if you need all of the medications you are currently taking -- if the "new generation medications" for Diabetes: (such as truliciity/ ozempic) would be right for you?  If so- ask his office team to help you apply for patient assistance through the manufacturer  Use your Aging Gracefully booklet provided to you today to record your blood sugars and blood pressures at home so you can take to your doctor appointments with you and show your doctor- these home values will help your doctor decide if you are on the right medications  Try using the pill box using the methods we discussed; always fill it at a sturdy table where the prescription bottles and pill box will not fall off of; only keep one prescription bottle open at a time; when you finish filling all of the slots with that medication, close the top securely and go to the next prescription bottle, and so on, until you are finished with each medication     If strategy does not work the first time, try it again.     We may make some changes over the next few sessions.      Oneta Rack, RN, BSN, [employer] [facility] Care Management  [phone]
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
## 2020-02-21 ENCOUNTER — Ambulatory Visit: Payer: Medicare Other | Admitting: Cardiology

## 2020-02-21 ENCOUNTER — Encounter: Payer: Self-pay | Admitting: Cardiology

## 2020-02-21 ENCOUNTER — Other Ambulatory Visit: Payer: Self-pay

## 2020-02-21 VITALS — BP 112/60 | HR 62 | Temp 97.0°F | Ht 67.5 in | Wt 181.0 lb

## 2020-02-21 DIAGNOSIS — E1169 Type 2 diabetes mellitus with other specified complication: Secondary | ICD-10-CM

## 2020-02-21 DIAGNOSIS — E119 Type 2 diabetes mellitus without complications: Secondary | ICD-10-CM | POA: Diagnosis not present

## 2020-02-21 DIAGNOSIS — I2510 Atherosclerotic heart disease of native coronary artery without angina pectoris: Secondary | ICD-10-CM | POA: Diagnosis not present

## 2020-02-21 DIAGNOSIS — E785 Hyperlipidemia, unspecified: Secondary | ICD-10-CM

## 2020-02-21 DIAGNOSIS — I10 Essential (primary) hypertension: Secondary | ICD-10-CM

## 2020-02-21 DIAGNOSIS — Z794 Long term (current) use of insulin: Secondary | ICD-10-CM

## 2020-02-21 MED ORDER — ISOSORBIDE MONONITRATE ER 60 MG PO TB24: 90.0000 mg | ORAL_TABLET | Freq: Every day | ORAL | 3 refills | Status: DC

## 2020-03-09 ENCOUNTER — Other Ambulatory Visit: Payer: Self-pay | Admitting: *Deleted

## 2020-03-09 ENCOUNTER — Encounter: Payer: Self-pay | Admitting: *Deleted

## 2020-03-09 NOTE — Patient Outreach (Signed)
Aging Gracefully Program  03/09/2020  KEANU VITERI [birth year] [id number]  Attempted to contact client for scheduled Aging Gracefully RN Visit # 2; unfortunately client did not answer phone and I was not able to leave him a voice message requesting call back, as his automated outgoing voice message stated that his voice mail box is full and cannot accept new messages.  Plan:  Will update Aging Gracefully team on today's unsuccessful aging Gracefully RN Visit # 2  Will re-attempt outreach to complete Aging Gracefully RN Visit # 2 again next week  Oneta Rack, RN, BSN, [employer] Coordinator [facility] Care Management  [phone]

## 2020-03-15 ENCOUNTER — Encounter: Payer: Self-pay | Admitting: *Deleted

## 2020-03-15 ENCOUNTER — Other Ambulatory Visit: Payer: Self-pay | Admitting: *Deleted

## 2020-03-15 NOTE — Patient Instructions (Signed)
[place], it was nice talking to you today to follow up on our first home visit; I am glad that you had a chance to review your medications with your doctors and encourage you to review medications with each office visit  You may find it helpful to write your questions down on paper to take with you to doctor's appointments so you don't forget to ask about what is important to you  I have scheduled our next visit at your [facility] Home for Wednesday June 30-- I will arrive at your home close to 1:00 pm.  I look forward to seeing you there   CLIENT/RN ACTION PLAN - MEDICATIONS  Registered Nurse:  Reginia Naas RN Date: 03/15/20  Client Name:Nilson Insalaco Client ID: [id number]   Target Area:  MEDICATIONS   Why Problem May Occur: Client is on insulin for diabetes, takes blood pressure medications, and may need to use nitroglycerin   Target Goal: Client would like to consolidate and simplify/ possibly decrease the amount of medications he takes on a daily basis   STRATEGIES Medication Problem Ideas  Wrong Medicine  Read the label on the pill bottle each time you take a medicine. Check the name, time, and amount of medication that you are supposed to take.  Keep a list of your medicines.  Look at the list carefully when filling pill box and taking a medicines.    Can't See the Medicine Label  Ask your pharmacy to put labels with large print on the medicine bottle.  Use a magnifying glass or reading glasses to read the labels.  Get your vision checked. Although you are able to see the writing on your prescriptions bottles, regular eye exams are important when you have Diabetes, to make sure you are not experiencing complications   Too Much Over the Counter Medicine-- N/A  Diphenhydramine (Benadryl), Ibuprofen (Motrin) and Acetaminophen (Tylenol) are ingredients in many over the counter medications but are hard to find on the label.  For example, Benadryl is in Tylenol PM.  Read the label  carefully or ask the pharmacist what is in the medicine you want to buy.    Changes in Medicine- be sure to discuss your medications with your doctors each time you visit with them  Bring your medicine list each time you see a Healthcare Provider.  Update the list of your medications every time you see a Acupuncturist.    Side Effects or Interactions  Tell your Primary Care Provider about all new medicines that you start.  Foods, herbs, and vitamins can change how medications work.  Tell your Healthcare Provider about any side effects you may be having; because you are on blood pressure medications, NTG, and insulin, it is important for your doctor to know if you experience dizziness   Write down your blood pressures and blood sugars at home to take with you to doctor's appointments- it will help your doctor decide if you are on the right medications  Forget to Take Medicines  Keep using the pillbox provided to you during our first visit- I am glad that it is helping you keep your medications organized.  Have a family member help you fill your pillbox.  Have a family member call to remind you to take your medicine.    Fall or Feel Dizzy- this side effect could occur with your blood pressure medication, the NTG, and the insulin: if you start feeling dizziness or lightheadedness, it is important to tell your doctor right  away  Many medications can make you dizzy or sleepy and increase your risk of falling.  Your Aging Gracefully Nurse will look at your medications and see if you are taking any that cause falling.  Tell your Primary Care Provider if you feel dizzy or fall down.  It could be from a medicine you are taking.     Suddenly Stop Medicine  Many medications have side effects if stopped suddenly.    Get your medications refilled before you run out.  Talk to your Healthcare Provider before stopping any medications.    Pills Hard to Swallow- N/A  Take pills with  milk, applesauce, or yogurt.  Ask your pharmacist if you can crush or cut your pills.  Ask if your medication comes in a smaller size pill or liquid.    Other Problems: 1. 2. 3. 4. 5. Other strategies: 1. 2. 3. 4. 5.    PRACTICE It is important to practice the strategies so we can determine if they will be effective in helping to reach the goal.    Follow these specific recommendations:  Ask your doctor about your current medications each time you visit with them: -- should I be taking Vitamin D/ calcium to help my bones stay strong? -- show your doctor your home blood pressures and blood sugars and ask if you need all of the medications you are currently taking; verify doses of medications if you are unsure how much you should be taking -- if the "new generation medications" for Diabetes: (such as truliciity/ ozempic) would be right for you?  If so- ask his office team to help you apply for patient assistance through the manufacturer  Use your Aging Gracefully booklet provided to you during our first visit to record your blood sugars and blood pressures at home so you can take to your doctor appointments with you and show your doctor- these home values will help your doctor decide if you are on the right medications  Keep using the pill box using the methods we discussed; always fill it at a sturdy table where the prescription bottles and pill box will not fall off of; only keep one prescription bottle open at a time; when you finish filling all of the slots with that medication, close the top securely and go to the next prescription bottle, and so on, until you are finished with each medication     If strategy does not work the first time, try it again.     We may make some changes over the next few sessions.      Oneta Rack, RN, BSN, [employer] [facility] Care Management  [phone]

## 2020-03-15 NOTE — Patient Outreach (Signed)
Aging Psychiatrist Visit # 2- by phone  03/15/2020  Antonio [name] [birth year] [id number]  Visit:   Aging Gracefully RN Visit # 2- completed by phone  Start Time:   12:20 pm End Time:   12:40 pm Total Minutes:   20 minutes  Universal RN Interventions: Calendar Distribution: Yes(Reviewed with client today) Exercise Review: Yes(reviewed with client today) Medications: Yes(reviewed with client today) Medication Changes: No(client reports no changes to medications today) PCP Advocacy/Support: Yes(Reviewed with client today)  Healthcare Provider Communication: Did Higher education careers adviser With [employer] Provider?: No  Clinician View of Client Situation: Clinician View Of Client Situation: Webber continues to report a good rapport with his care providers and confirms that he has talked to his doctors about his current medications and understands that his care providers do not wish to make any changes in his current medications; he reports using his pill box which he believes helps him keep his medications organized.  He reports that he and his wife continue living with their daughter while home repairs and modifications are being completed.  Alta appears to be on track in meeting his established Aging Gracefully goals.   Client's View of His/Her Situation: Client View Of His/Her Situation: "I am doing very good since our first visit last month; I talked to my doctors about the medications I am on and they have told me that I should keep taking them all and they did not make any changes; I am using the pill box you gave me and it is helping me stay organized with the medications I do take.  We have started doing the exercises you showed [place], and they are helping [place], especially my wife.  I am also walking and using the treadmill so all of that helps my aches and pains."  Medication Assessment: confirmed with client that he has discussed medication regimen with care providers and that no  changes were made to his current medications; encouraged client to review medications with care providers with each office visit, or to contact care providers promptly for any new concerns with medications    OT Update: will update Aging Gracefully team on successful completion of Aging Gracefully RN Visit # 2  Session Summary: Pleasant telephone visit with client; client appears to be making good progress/ on track to meeting his previously established Aging Gracefully goals around medication management; he reports using pill box which is helping.  He continues to report ongoing good rapport with care providers and denies any new concerns around his medications.  We scheduled Aging Gracefully RN Visit # 3 for April 12, 2020, and I explained to client that this will be an in-person visit at his home where the modifications are being completed and that I would call him first to confirm/ complete corona virus screening tool.  Oneta Rack, RN, BSN, [employer] [facility] Care Management  [phone]

## 2020-04-11 ENCOUNTER — Other Ambulatory Visit: Payer: Self-pay | Admitting: Internal Medicine

## 2020-04-11 ENCOUNTER — Ambulatory Visit
Admission: RE | Admit: 2020-04-11 | Discharge: 2020-04-11 | Disposition: A | Discharge status: Home / Self Care | Payer: Medicare Other | Source: Ambulatory Visit | Attending: Internal Medicine | Admitting: Internal Medicine

## 2020-04-11 DIAGNOSIS — M549 Dorsalgia, unspecified: Secondary | ICD-10-CM

## 2020-04-12 ENCOUNTER — Other Ambulatory Visit: Payer: Self-pay | Admitting: *Deleted

## 2020-04-12 ENCOUNTER — Other Ambulatory Visit: Payer: Self-pay

## 2020-04-12 ENCOUNTER — Encounter: Payer: Self-pay | Admitting: *Deleted

## 2020-04-12 NOTE — Patient Instructions (Addendum)
Antonio [name], it was so nice visiting with you and Antonio [name] today!  I am really glad that you are enjoying the home modifications that have been completed so far and that you have been able to stay active, despite your ongoing aches and pain and stiffness- keep up the great work at staying active.  I am glad that you are using the pill box and finding it helpful.  Be sure to talk to the Occupational Therapist about your progress and desire to have a ramp built outside.  It is very important that you maintain contact with the Aging Gracefully team to help you attain your goals  I have scheduled my next and final RN visit with you on Tuesday May 16, 2020: I will call you on the phone between 12:30 and 1:00 pm- please be listening for my phone call so I don't miss you  Please review updates from our April 12, 2020 Aging Gracefully RN # 3 visit in bold blue text  CLIENT/RN ACTION PLAN - MEDICATIONS  Registered Nurse:  Reginia Naas RN Date: 04/12/2020  [name] Client ID: [id number]   Target Area:  MEDICATIONS   Why Problem May Occur: Client is on insulin for diabetes, takes blood pressure medications, and may need to use nitroglycerine   Target Goal: Client would like to consolidate and simplify/ possibly decrease the amount of medications he takes on a daily basis   STRATEGIES Medication Problem Ideas  Wrong Medicine . Read the label on the pill bottle each time you take a medicine. Check the name, time, and amount of medication that you are supposed to take. Marland Kitchen Keep a list of your medicines.  Look at the list carefully when filling pill box and taking a medicines. .   Can't See the Medicine Label . Ask your pharmacy to put labels with large print on the medicine bottle. . Use a magnifying glass or reading glasses to read the labels. . Get your vision checked. Although you are able to see the writing on your prescriptions bottles, regular eye exams are important when you have  Diabetes, to make sure you are not experiencing complications   Too Much Over the Counter Medicine-- N/A . Diphenhydramine (Benadryl), Ibuprofen (Motrin) and Acetaminophen (Tylenol) are ingredients in many over the counter medications but are hard to find on the label.  For example, Benadryl is in Tylenol PM. . Read the label carefully or ask the pharmacist what is in the medicine you want to buy. .   Changes in Medicine- be sure to discuss your medications with your doctor when you visit with him next month . Bring your medicine list each time you see a Acupuncturist. Marland Kitchen Update the list of your medications every time you see a Acupuncturist. .   Side Effects or Interactions . Tell your Primary Care Provider about all new medicines that you start. . Foods, herbs, and vitamins can change how medications work. . Tell your Healthcare Provider about any side effects you may be having; because you are on blood pressure medications, NTG, and insulin, it is important for your doctor to know if you experience dizziness  . Write down your blood pressures and blood sugars at home to take with you to doctor's appointments- it will help your doctor decide if you are on the right medications . Tell your doctor if your blood sugars continue running high while you are taking the short-term prednisone that was recently prescribed  Forget to Take Medicines .  Try using the pillbox provided to you today. I am so glad that you are finding the pill box helpful in managing your medications . Have a family member help you fill your pillbox. . Have a family member call to remind you to take your medicine. .   Fall or Feel Dizzy- this side effect could occur with your blood pressure medication, the NTG, and the insulin: if you start feeling dizziness or lightheadedness, it is important to tell your doctor right away . Many medications can make you dizzy or sleepy and increase your risk of falling. . Your Aging  Gracefully Nurse will look at your medications and see if you are taking any that cause falling. . Tell your Primary Care Provider if you feel dizzy or fall down.  It could be from a medicine you are taking.  . Talk to your doctors each time you see them about reviewing your medications  Suddenly Stop Medicine . Many medications have side effects if stopped suddenly.   . Get your medications refilled before you run out. . Talk to your Healthcare Provider before stopping any medications. .   Pills Hard to Swallow- N/A . Take pills with milk, applesauce, or yogurt. . Ask your pharmacist if you can crush or cut your pills. . Ask if your medication comes in a smaller size pill or liquid. .   Other Problems: 1. 2. 3. 4. 5. Other strategies: 1. 2. 3. 4. 5.    PRACTICE It is important to practice the strategies so we can determine if they will be effective in helping to reach the goal.    Follow these specific recommendations:  Ask your doctor about your current medications with each office visit: -- should I be taking Vitamin D/ calcium to help my bones stay strong? -- show your doctor your home blood pressures and blood sugars and ask if you need all of the medications you are currently taking -- if the "new generation medications" for Diabetes: (such as truliciity/ ozempic) would be right for you?  If so- ask his office team to help you apply for patient assistance through the manufacturer  Keep using your Aging Gracefully booklet provided to you to record your blood sugars and blood pressures at home so you can take to your doctor appointments with you and show your doctor- these home values will help your doctor decide if you are on the right medications  Keep using the pill box using the methods we discussed; always fill it at a sturdy table where the prescription bottles and pill box will not fall off of; only keep one prescription bottle open at a time; when you finish filling all  of the slots with that medication, close the top securely and go to the next prescription bottle, and so on, until you are finished with each medication     If strategy does not work the first time, try it again.     We may make some changes over the next few sessions.      Oneta Rack, RN, BSN, [employer] [facility] Care Management  [phone]

## 2020-04-12 NOTE — Patient Outreach (Signed)
Aging Gracefully Program  RN Visit # 3  04/12/2020  Antonio [name] May 19, 1946 [id number]  Visit:   RN Visit # 3  Start Time:   9326 End Time:   7124 Total Minutes:   25 minutes  Universal RN Interventions: Calendar Distribution: Yes (Reviewed with client today) Exercise Review: Yes (Reviewed with client today) Medications: Yes (Reviewed with client today) Medication Changes: Yes (Client reports short-term course of prednisone recently prescribed for muscle aches) Pain: Yes (Reviewed with client today) PCP Advocacy/Support: Yes (Reviewed with client today) Fall Prevention: Yes (Reviewed with client today)  Healthcare Provider Communication: Did Higher education careers adviser With [employer] Provider?: No  Clinician View of Client Situation: Clinician View Of Client Situation: Antonio [name] continues to report good progress toward meeting his previously stated Aging Gracefully goals around medication management.  He has remained active, despite having chronic aches and pain and has experienced no recent falls.  He continues to report a good rapport with his care providers and was encouraged to review his daily medications with all care providers with each office visit.  he was also encouraged to contact his care providers pro-actively should his blood sugars remain elevated now that he is taking short-term prednisone which was recently prescribed.  Antonio [name] appears to be on track in meeting his previously stated aging Gracefuly goals.   Client's View of His/Her Situation: Client View Of His/Her Situation: "Things are going very well with me and I am real happy with all the home repairs they have accomplished, it all looks so nice and is going to be great once we are living here again full time.  I talked to my doctor about my medicines and he did not think that we should change anything.  He just added a short-term course of prednisone for aches and pains that I have had in my muscles.  I am glad you told  me that it could raise my blood sugar, because I didn't know why my blood sugars had been running high.  I am going to call my doctor and tell him what my sugars have been running.  I am still using the pill box and have found it to be very helpful.  I am still using the treadmill at my daughter's home and I try to do the exercises you showed [place] with Antonio [name] to help her stay motivated."  Medication Assessment: confirmed that patient was recently prescribed short-term prednisone; confirmed that there have been no additional changes to his medications   OT Update: will update Aging Gracefully team on today's successful completion of RN Visit # 3  Session Summary: Pleasant, productive Aging Gracefully RN Visit # 3; reviewed all previously established Aging Gracefully goals/ target areas with client, and brainstorming around additional strategies was completed with client.  Agreed on one more final RN Visit by telephone for wrap-up within 4-6 weeks, after upcoming Aging Gracefully OT visits  Oneta Rack, RN, BSN, [place] Care Management  [phone]

## 2020-04-18 ENCOUNTER — Other Ambulatory Visit: Payer: Self-pay

## 2020-04-18 ENCOUNTER — Other Ambulatory Visit: Payer: Self-pay | Admitting: Occupational Therapy

## 2020-04-18 NOTE — Patient Outreach (Signed)
Aging Gracefully Program  OT Follow-Up Visit  04/18/2020  Antonio [name] October 04, 1946 [id number]  Visit:  2- Second Visit  Start Time:  1300 End Time:  4315 Total Minutes:  35  CCAP: completed at initial eval    Readiness to Change Score :  Readiness to Change Score: 9  Home Environment Assessment: completed at initial eval. Note that renovations have began. Pt roof has been repaired, free of holes. Shower and commodes have been replaced, as well as grab bars.    Durable Medical Equipment: Durable Medical Equipment: Vassie Moselle Durable Medical Equipment Distribution Date: 04/18/20 Adaptive Equipment: Bed Rail Adaptive Equipment Distribution Date: 04/18/20  Patient Education: Education Provided: Yes Education Details: provdied caregiver training for pt to help improve confidence with mangaging DME and assisting wife. Person(s) Educated: Patient, Spouse Comprehension: Verbalized Understanding  Goals:  Goals Addressed            This Visit's Progress    COMPLETED: Patient Stated       Pt will reduce caregiver burden with adaptive strategies, education and environment changes      COMPLETED: Patient Stated       Patient will improve rest and sleep with necessary environmental and home safety modifications       Post Clinical Reasoning: Client Action (Goal) Two Interventions: Pt was present for caregiver training with DME/AE givento wife to improve wife's mobility. Pt has been assisting wife in/out of bed, on/off of chairs, and in out of shower. Educated on appropriate techniques and body mechanics to maintain safety. Did Client Try?: Yes Targeted Problem Area Status: A Lot Better Client Action (Goal) Four Interventions: Pt reports srest and sleep significantly improved with home modifications. Pt feels much better equipped and safe in environment. Did Client Try?: No Targeted Problem Area Status: A Lot Better Clinician View Of Client Situation:: Pt progressing  well with caregiver training. Will continue to educate pt on his own safety in shower/tub transfers as well as ECS strategies. Pt spends a lot of energy and time on wife Client View Of His/Her Situation:: very recieving of information, willing to learn Next Visit Plan:: address ECS and safe shower transfers.    Zenovia Jarred, MSOT, OTR/L Acute Rehabilitation Services [facility] Office Number: [phone] Pager: [phone]

## 2020-05-16 ENCOUNTER — Other Ambulatory Visit: Payer: Self-pay | Admitting: *Deleted

## 2020-05-16 NOTE — Patient Outreach (Signed)
Aging Psychiatrist Visit # 4- final  05/16/2020  Antonio [name] Nov 11, 1945 [id number]  Visit:   Aging Gracefully RN Visit # 4- Final visit; by phone  Start Time:   1:00 pm  End Time:   1:15 pm Total Minutes:   1:15 pm  Readiness To Change Score:  Readiness to Change Score: 10  Universal RN Interventions: Calendar Distribution: Yes (reviewed with client today) Exercise Review: Yes (reviewed with client today) Medications: Yes (reviewed with client today) Medication Changes: Yes (Reports no longer taking prednisone) Pain: Yes (reviewed with client today) PCP Advocacy/Support: Yes (reviewed with client today) Fall Prevention: Yes (reviewed with client today)  Healthcare Provider Communication: Did Higher education careers adviser With [employer] Provider?: No  Clinician View of Client Situation: Clinician View Of Client Situation: Antonio [name] remains motivated to follow his self-health care activities around his medications and his management of chronic pain and diabetes; he continues using his pill box, which has improved his confidence and ability to independently manage his medications.  Antonio [name] has successfully achieved his previously stated Aging Designer, fashion/clothing goals.   Client's View of His/Her Situation: Ambulance person Of His/Her Situation: "I am doing well; I am still using the pill box which makes me taking my medicines so much easier-- it's all just right there and I feel like I am spending less time managing my medications; my blood sugars stayed high when I was taking the prednisone-- so I stopped taking it and let my doctor know; I plan to ask my doctor about other options to help my arthritis and stiff bones when I go to my next doctor's appointment.  I am still checking my blood sugars and feel overall better about how I understand to do what I am supposed to do.  I have be very careful in this hot weather- one day a couple of weeks ago I had a dizzy spell and it was because my sugar  was high; my blood sugars are back to normal now.  I sure am ready for the home repairs to be completed- I am ready to be back in my home full time."  Medication Assessment: confirmed that client is no longer taking short term prednisone; confirmed no other changes to his medications  OT Update: Will update Aging Gracefully team on successful completion of (final) Aging Gracefully RN visit # 4 by phone today  Session Summary: Pleasant (final) Aging Gracefully RN visit # 4 with client by phone; client confirms that he is more confident in managing medications; he verbalizes appropriate action plan to address any medication issues/ concerns in the future.  Client confirms that he has successfully accomplished his previously stated Aging Designer, fashion/clothing goals; encouraged he and his wife (also in [place] program) to continue maintaining contact with Aging Gracefully team, as home modifications and OT visits progress; he is agreeable and confirms that he has contact information for Aging Gracefully team.  Client and his spouse are both very excited to have home repairs completed so they may move back into their home permanently  It has been a pleasure working with Antonio [name],  Oneta Rack, RN, BSN, [facility] Care Management  [phone]

## 2020-07-16 ENCOUNTER — Inpatient Hospital Stay (HOSPITAL_COMMUNITY): Payer: Medicare HMO

## 2020-07-16 ENCOUNTER — Other Ambulatory Visit: Payer: Self-pay

## 2020-07-16 ENCOUNTER — Inpatient Hospital Stay (HOSPITAL_COMMUNITY)
Admission: EM | Admit: 2020-07-16 | Discharge: 2020-07-20 | DRG: 194 | Disposition: A | Discharge status: Home / Self Care | Payer: Medicare HMO | Attending: Internal Medicine | Admitting: Internal Medicine

## 2020-07-16 ENCOUNTER — Emergency Department (HOSPITAL_COMMUNITY): Payer: Medicare HMO

## 2020-07-16 DIAGNOSIS — Z794 Long term (current) use of insulin: Secondary | ICD-10-CM

## 2020-07-16 DIAGNOSIS — E1169 Type 2 diabetes mellitus with other specified complication: Secondary | ICD-10-CM | POA: Diagnosis present

## 2020-07-16 DIAGNOSIS — Z79899 Other long term (current) drug therapy: Secondary | ICD-10-CM

## 2020-07-16 DIAGNOSIS — Z87891 Personal history of nicotine dependence: Secondary | ICD-10-CM | POA: Diagnosis not present

## 2020-07-16 DIAGNOSIS — I2510 Atherosclerotic heart disease of native coronary artery without angina pectoris: Secondary | ICD-10-CM | POA: Diagnosis present

## 2020-07-16 DIAGNOSIS — Z8546 Personal history of malignant neoplasm of prostate: Secondary | ICD-10-CM | POA: Diagnosis not present

## 2020-07-16 DIAGNOSIS — Z8249 Family history of ischemic heart disease and other diseases of the circulatory system: Secondary | ICD-10-CM

## 2020-07-16 DIAGNOSIS — F1011 Alcohol abuse, in remission: Secondary | ICD-10-CM | POA: Diagnosis present

## 2020-07-16 DIAGNOSIS — E11649 Type 2 diabetes mellitus with hypoglycemia without coma: Secondary | ICD-10-CM | POA: Diagnosis not present

## 2020-07-16 DIAGNOSIS — I950 Idiopathic hypotension: Secondary | ICD-10-CM

## 2020-07-16 DIAGNOSIS — Z951 Presence of aortocoronary bypass graft: Secondary | ICD-10-CM | POA: Diagnosis not present

## 2020-07-16 DIAGNOSIS — E119 Type 2 diabetes mellitus without complications: Secondary | ICD-10-CM

## 2020-07-16 DIAGNOSIS — R55 Syncope and collapse: Secondary | ICD-10-CM | POA: Diagnosis present

## 2020-07-16 DIAGNOSIS — I951 Orthostatic hypotension: Secondary | ICD-10-CM | POA: Diagnosis not present

## 2020-07-16 DIAGNOSIS — Z7984 Long term (current) use of oral hypoglycemic drugs: Secondary | ICD-10-CM

## 2020-07-16 DIAGNOSIS — I081 Rheumatic disorders of both mitral and tricuspid valves: Secondary | ICD-10-CM | POA: Diagnosis present

## 2020-07-16 DIAGNOSIS — Z7982 Long term (current) use of aspirin: Secondary | ICD-10-CM | POA: Diagnosis not present

## 2020-07-16 DIAGNOSIS — Z8042 Family history of malignant neoplasm of prostate: Secondary | ICD-10-CM | POA: Diagnosis not present

## 2020-07-16 DIAGNOSIS — E876 Hypokalemia: Secondary | ICD-10-CM | POA: Diagnosis not present

## 2020-07-16 DIAGNOSIS — K219 Gastro-esophageal reflux disease without esophagitis: Secondary | ICD-10-CM | POA: Diagnosis present

## 2020-07-16 DIAGNOSIS — E785 Hyperlipidemia, unspecified: Secondary | ICD-10-CM | POA: Diagnosis present

## 2020-07-16 DIAGNOSIS — Z801 Family history of malignant neoplasm of trachea, bronchus and lung: Secondary | ICD-10-CM | POA: Diagnosis not present

## 2020-07-16 DIAGNOSIS — N179 Acute kidney failure, unspecified: Secondary | ICD-10-CM | POA: Diagnosis present

## 2020-07-16 DIAGNOSIS — E872 Acidosis: Secondary | ICD-10-CM | POA: Diagnosis present

## 2020-07-16 DIAGNOSIS — R680 Hypothermia, not associated with low environmental temperature: Secondary | ICD-10-CM | POA: Diagnosis present

## 2020-07-16 DIAGNOSIS — I959 Hypotension, unspecified: Secondary | ICD-10-CM | POA: Diagnosis present

## 2020-07-16 DIAGNOSIS — I200 Unstable angina: Secondary | ICD-10-CM | POA: Diagnosis not present

## 2020-07-16 DIAGNOSIS — Z955 Presence of coronary angioplasty implant and graft: Secondary | ICD-10-CM | POA: Diagnosis not present

## 2020-07-16 DIAGNOSIS — N400 Enlarged prostate without lower urinary tract symptoms: Secondary | ICD-10-CM | POA: Diagnosis present

## 2020-07-16 DIAGNOSIS — Z833 Family history of diabetes mellitus: Secondary | ICD-10-CM | POA: Diagnosis not present

## 2020-07-16 DIAGNOSIS — Z20822 Contact with and (suspected) exposure to covid-19: Secondary | ICD-10-CM | POA: Diagnosis present

## 2020-07-16 DIAGNOSIS — R079 Chest pain, unspecified: Secondary | ICD-10-CM

## 2020-07-16 DIAGNOSIS — J189 Pneumonia, unspecified organism: Secondary | ICD-10-CM | POA: Diagnosis not present

## 2020-07-16 DIAGNOSIS — I340 Nonrheumatic mitral (valve) insufficiency: Secondary | ICD-10-CM | POA: Diagnosis not present

## 2020-07-16 DIAGNOSIS — D696 Thrombocytopenia, unspecified: Secondary | ICD-10-CM | POA: Diagnosis present

## 2020-07-16 DIAGNOSIS — R5381 Other malaise: Secondary | ICD-10-CM | POA: Diagnosis present

## 2020-07-16 DIAGNOSIS — Z923 Personal history of irradiation: Secondary | ICD-10-CM

## 2020-07-16 DIAGNOSIS — I10 Essential (primary) hypertension: Secondary | ICD-10-CM | POA: Diagnosis present

## 2020-07-16 LAB — URINALYSIS, ROUTINE W REFLEX MICROSCOPIC
Bilirubin Urine: NEGATIVE
Glucose, UA: NEGATIVE mg/dL
Hgb urine dipstick: NEGATIVE
Ketones, ur: NEGATIVE mg/dL
Leukocytes,Ua: NEGATIVE
Nitrite: NEGATIVE
Protein, ur: NEGATIVE mg/dL
Specific Gravity, Urine: 1.013 (ref 1.005–1.030)
pH: 7 (ref 5.0–8.0)

## 2020-07-16 LAB — CBC
HCT: 39.5 % (ref 39.0–52.0)
Hemoglobin: 12.8 g/dL — ABNORMAL LOW (ref 13.0–17.0)
MCH: 28.1 pg (ref 26.0–34.0)
MCHC: 32.4 g/dL (ref 30.0–36.0)
MCV: 86.8 fL (ref 80.0–100.0)
Platelets: 158 10*3/uL (ref 150–400)
RBC: 4.55 MIL/uL (ref 4.22–5.81)
RDW: 16.8 % — ABNORMAL HIGH (ref 11.5–15.5)
WBC: 7.4 10*3/uL (ref 4.0–10.5)
nRBC: 0 % (ref 0.0–0.2)

## 2020-07-16 LAB — CREATININE, SERUM
Creatinine, Ser: 1.36 mg/dL — ABNORMAL HIGH (ref 0.61–1.24)
GFR calc Af Amer: >60 mL/min (ref >60)
GFR calc non Af Amer: 52 mL/min — ABNORMAL LOW (ref >60)

## 2020-07-16 LAB — CBC WITH DIFFERENTIAL/PLATELET
Abs Immature Granulocytes: 0.01 10*3/uL (ref 0.00–0.07)
Basophils Absolute: 0 10*3/uL (ref 0.0–0.1)
Basophils Relative: 0 %
Eosinophils Absolute: 0.2 10*3/uL (ref 0.0–0.5)
Eosinophils Relative: 3 %
HCT: 36.8 % — ABNORMAL LOW (ref 39.0–52.0)
Hemoglobin: 11.8 g/dL — ABNORMAL LOW (ref 13.0–17.0)
Immature Granulocytes: 0 %
Lymphocytes Relative: 35 %
Lymphs Abs: 1.8 10*3/uL (ref 0.7–4.0)
MCH: 27.4 pg (ref 26.0–34.0)
MCHC: 32.1 g/dL (ref 30.0–36.0)
MCV: 85.6 fL (ref 80.0–100.0)
Monocytes Absolute: 0.5 10*3/uL (ref 0.1–1.0)
Monocytes Relative: 10 %
Neutro Abs: 2.6 10*3/uL (ref 1.7–7.7)
Neutrophils Relative %: 52 %
Platelets: 144 10*3/uL — ABNORMAL LOW (ref 150–400)
RBC: 4.3 MIL/uL (ref 4.22–5.81)
RDW: 16.6 % — ABNORMAL HIGH (ref 11.5–15.5)
WBC: 5.1 10*3/uL (ref 4.0–10.5)
nRBC: 0 % (ref 0.0–0.2)

## 2020-07-16 LAB — APTT: aPTT: 28 seconds (ref 24–36)

## 2020-07-16 LAB — MAGNESIUM: Magnesium: 1.9 mg/dL (ref 1.7–2.4)

## 2020-07-16 LAB — LIPASE, BLOOD: Lipase: 42 U/L (ref 11–51)

## 2020-07-16 LAB — COMPREHENSIVE METABOLIC PANEL
ALT: 21 U/L (ref 0–44)
AST: 25 U/L (ref 15–41)
Albumin: 3.4 g/dL — ABNORMAL LOW (ref 3.5–5.0)
Alkaline Phosphatase: 39 U/L (ref 38–126)
Anion gap: 9 (ref 5–15)
BUN: 16 mg/dL (ref 8–23)
CO2: 24 mmol/L (ref 22–32)
Calcium: 8.6 mg/dL — ABNORMAL LOW (ref 8.9–10.3)
Chloride: 107 mmol/L (ref 98–111)
Creatinine, Ser: 1.48 mg/dL — ABNORMAL HIGH (ref 0.61–1.24)
GFR calc Af Amer: 54 mL/min — ABNORMAL LOW (ref >60)
GFR calc non Af Amer: 47 mL/min — ABNORMAL LOW (ref >60)
Glucose, Bld: 114 mg/dL — ABNORMAL HIGH (ref 70–99)
Potassium: 3.5 mmol/L (ref 3.5–5.1)
Sodium: 140 mmol/L (ref 135–145)
Total Bilirubin: 0.9 mg/dL (ref 0.3–1.2)
Total Protein: 5.9 g/dL — ABNORMAL LOW (ref 6.5–8.1)

## 2020-07-16 LAB — TSH: TSH: 0.457 u[IU]/mL (ref 0.350–4.500)

## 2020-07-16 LAB — PROTIME-INR
INR: 1.1 (ref 0.8–1.2)
Prothrombin Time: 13.7 seconds (ref 11.4–15.2)

## 2020-07-16 LAB — GLUCOSE, CAPILLARY
Glucose-Capillary: 100 mg/dL — ABNORMAL HIGH (ref 70–99)
Glucose-Capillary: 154 mg/dL — ABNORMAL HIGH (ref 70–99)

## 2020-07-16 LAB — LACTIC ACID, PLASMA
Lactic Acid, Venous: 2 mmol/L (ref 0.5–1.9)
Lactic Acid, Venous: 1.5 mmol/L (ref 0.5–1.9)

## 2020-07-16 LAB — CBG MONITORING, ED: Glucose-Capillary: 83 mg/dL (ref 70–99)

## 2020-07-16 LAB — D-DIMER, QUANTITATIVE: D-Dimer, Quant: 0.41 ug/mL-FEU (ref 0.00–0.50)

## 2020-07-16 LAB — HEMOGLOBIN A1C
Hgb A1c MFr Bld: 7.7 % — ABNORMAL HIGH (ref 4.8–5.6)
Mean Plasma Glucose: 174.29 mg/dL

## 2020-07-16 LAB — RESPIRATORY PANEL BY RT PCR (FLU A&B, COVID)
Influenza A by PCR: NEGATIVE
Influenza B by PCR: NEGATIVE
SARS Coronavirus 2 by RT PCR: NEGATIVE

## 2020-07-16 LAB — TROPONIN I (HIGH SENSITIVITY)
Troponin I (High Sensitivity): 7 ng/L (ref <18)
Troponin I (High Sensitivity): 8 ng/L (ref <18)

## 2020-07-16 MED ORDER — ROSUVASTATIN CALCIUM 20 MG PO TABS
40.0000 mg | ORAL_TABLET | Freq: Every day | ORAL | Status: DC
  Administered 2020-07-17 – 2020-07-20 (×4): 40 mg via ORAL
  Filled 2020-07-16 (×4): qty 2

## 2020-07-16 MED ORDER — ACETAMINOPHEN 325 MG PO TABS
650.0000 mg | ORAL_TABLET | Freq: Four times a day (QID) | ORAL | Status: DC | PRN
  Administered 2020-07-16 – 2020-07-19 (×4): 650 mg via ORAL
  Filled 2020-07-16 (×4): qty 2

## 2020-07-16 MED ORDER — ACETAMINOPHEN 650 MG RE SUPP
650.0000 mg | Freq: Four times a day (QID) | RECTAL | Status: DC | PRN
  Filled 2020-07-16: qty 1

## 2020-07-16 MED ORDER — INSULIN ASPART 100 UNIT/ML ~~LOC~~ SOLN: 0.0000 [IU] | Freq: Every day | SUBCUTANEOUS | Status: DC

## 2020-07-16 MED ORDER — SODIUM CHLORIDE 0.9 % IV SOLN: INTRAVENOUS | Status: DC

## 2020-07-16 MED ORDER — ONDANSETRON HCL 4 MG PO TABS: 4.0000 mg | ORAL_TABLET | Freq: Four times a day (QID) | ORAL | Status: DC | PRN

## 2020-07-16 MED ORDER — ONDANSETRON HCL 4 MG/2ML IJ SOLN: 4.0000 mg | Freq: Four times a day (QID) | INTRAMUSCULAR | Status: DC | PRN

## 2020-07-16 MED ORDER — INSULIN DETEMIR 100 UNIT/ML ~~LOC~~ SOLN
45.0000 [IU] | Freq: Every day | SUBCUTANEOUS | Status: DC
  Administered 2020-07-17 – 2020-07-18 (×2): 45 [IU] via SUBCUTANEOUS
  Filled 2020-07-16 (×2): qty 0.45

## 2020-07-16 MED ORDER — ENOXAPARIN SODIUM 40 MG/0.4ML ~~LOC~~ SOLN
40.0000 mg | SUBCUTANEOUS | Status: DC
  Administered 2020-07-16 – 2020-07-19 (×4): 40 mg via SUBCUTANEOUS
  Filled 2020-07-16 (×4): qty 0.4

## 2020-07-16 MED ORDER — ASPIRIN EC 81 MG PO TBEC
81.0000 mg | DELAYED_RELEASE_TABLET | Freq: Every day | ORAL | Status: DC
  Administered 2020-07-17 – 2020-07-20 (×4): 81 mg via ORAL
  Filled 2020-07-16 (×4): qty 1

## 2020-07-16 MED ORDER — IOHEXOL 350 MG/ML SOLN
100.0000 mL | Freq: Once | INTRAVENOUS | Status: AC | PRN
  Administered 2020-07-16: 100 mL via INTRAVENOUS

## 2020-07-16 MED ORDER — PRASUGREL HCL 10 MG PO TABS
10.0000 mg | ORAL_TABLET | Freq: Every day | ORAL | Status: DC
  Administered 2020-07-17 – 2020-07-20 (×4): 10 mg via ORAL
  Filled 2020-07-16 (×4): qty 1

## 2020-07-16 MED ORDER — SODIUM CHLORIDE 0.9 % IV BOLUS (SEPSIS)
2000.0000 mL | Freq: Once | INTRAVENOUS | Status: AC
  Administered 2020-07-16: 2000 mL via INTRAVENOUS

## 2020-07-16 MED ORDER — INSULIN ASPART 100 UNIT/ML ~~LOC~~ SOLN
0.0000 [IU] | Freq: Three times a day (TID) | SUBCUTANEOUS | Status: DC
  Administered 2020-07-17 – 2020-07-18 (×2): 2 [IU] via SUBCUTANEOUS
  Administered 2020-07-20: 3 [IU] via SUBCUTANEOUS

## 2020-07-16 MED ORDER — ALUM & MAG HYDROXIDE-SIMETH 200-200-20 MG/5ML PO SUSP
15.0000 mL | Freq: Four times a day (QID) | ORAL | Status: DC | PRN
  Administered 2020-07-16 – 2020-07-17 (×2): 15 mL via ORAL
  Filled 2020-07-16 (×2): qty 30

## 2020-07-16 MED ORDER — PANTOPRAZOLE SODIUM 40 MG PO TBEC
40.0000 mg | DELAYED_RELEASE_TABLET | Freq: Every day | ORAL | Status: DC
  Administered 2020-07-16 – 2020-07-20 (×5): 40 mg via ORAL
  Filled 2020-07-16 (×5): qty 1

## 2020-07-16 NOTE — ED Notes (Signed)
Patient transported to CT 

## 2020-07-16 NOTE — ED Provider Notes (Signed)
[place] EMERGENCY DEPARTMENT Provider Note   CSN: [id number] Arrival date & time: 07/16/20  1156     History Chief Complaint  Patient presents with  . Chest Pain  . Dizziness  . Hypotension    Antonio [name] is a 74 y.o. male.  Patient arrives to the ED with chest pain, dizziness, found to be hypotensive with EMS.  Patient states that he went for a walk this morning and then while he was eating he had some dizziness and chest pain.  Took a nitroglycerin and chest pain improved however with EMS he was found to have blood pressure in the 35H systolic.  Upon my evaluation he is not having any pain including chest pain.  He feels generally weak throughout.  Denies any black stools or bloody stools.  States that he has chest pain several days a week for the last month.  Typically does not always have chest pain.  Had bypass surgery in the past.  Patient denies any infectious symptoms including no cough, no sputum production, no pain with urination.  No abdominal pain.  The history is provided by the patient.  Illness Severity:  Moderate Onset quality:  Sudden Timing:  Constant Progression:  Unchanged Chronicity:  New Associated symptoms: chest pain   Associated symptoms: no abdominal pain, no cough, no ear pain, no fever, no rash, no shortness of breath, no sore throat and no vomiting        Past Medical History:  Diagnosis Date  . Alcohol abuse, in remission   . Arthritis   . Bleeding per rectum    "related to prostate cancer and hemorrhoids" (09/03/2012)  . BPH (benign prostatic hyperplasia)   . CAD (coronary artery disease)    a. s/p CABG 1996  b. LHC in 11/2013 w/ patent grafts. c. 12/19/14 re-look cath with patent grafts and good LVF  . Erectile dysfunction   . GERD (gastroesophageal reflux disease)   . History of lower GI bleeding   . HLD (hyperlipidemia)   . HTN (hypertension)   . Prostate cancer ([place])    a. s/p radiation   . S/P angioplasty  with stent-DES ostial VG to PDA and DES ostial VG to diag. 08/27/19 08/28/2019  . Type II diabetes mellitus [facility])     Patient Active Problem List   Diagnosis Date Noted  . S/P angioplasty with stent-DES ostial VG to PDA and DES ostial VG to diag. 08/27/19 08/28/2019  . S/P CABG (coronary artery bypass graft)   . Hypertensive heart disease without heart failure   . Chest pain 08/09/2015  . Hypokalemia 08/09/2015  . Dyslipidemia 08/09/2015  . Chronic kidney disease 08/09/2015  . CAD (coronary artery disease) of bypass graft   . History of lower GI bleeding   . Erectile dysfunction   . Skin lesion 05/28/2014  . 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 Prostate cancer ([place]) 01/28/2014  . Unstable angina ([place]) 09/03/2012  . Lower GI bleeding 02/12/2012  . BRBPR (bright red blood per rectum) 01/03/2012  . Diabetes ([place]) 12/17/2010  . Hyperlipidemia associated with type 2 diabetes mellitus ([place]) 12/17/2010  . 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 Hypertension associated with diabetes ([place]) 12/17/2010  . BENIGN PROSTATIC HYPERTROPHY, WITH OBSTRUCTION 12/17/2010  . BPH (benign prostatic hyperplasia) 12/17/2010    Past Surgical History:  Procedure Laterality Date  . CARDIAC CATHETERIZATION  01/27/2009   ef 60%  . CARDIAC CATHETERIZATION  08/25/2012   Severe 3v obstructive CAD, continued graft  patency (SVG-D,  SVG-OM1-OM2, SVG-PDA, LIMA-LAD). area of diffuse dz in the distal LCx (up to 90%) unamenable to PCI  . CERVICAL DISC SURGERY  ? 1990's   "went in on the side" (09/03/2012)  . CORONARY ARTERY BYPASS GRAFT  1996   LIMA GRAFT TO THE LAD, SAPHENOUS VEIN GRAFT SEQUENTIALLY TO THE FIRST AND SECOND OBTUSE MARGINAL VESSELS, SAPHENOUS VEIN GRAFT TO THE DIAGONAL, AND SAPHENOUS VEIN GRAFT TO THE DISTAL RIGHT CORONARY   . CORONARY STENT INTERVENTION N/A 08/27/2019   Procedure: CORONARY STENT INTERVENTION;  Surgeon: Martinique, Peter M, MD;  Location: [place] CV LAB;  Service: Cardiovascular;   Laterality: N/A;  . ESOPHAGOGASTRODUODENOSCOPY Left 11/19/2013   Procedure: ESOPHAGOGASTRODUODENOSCOPY (EGD);  Surgeon: Arta Silence, MD;  Location: [facility] ENDOSCOPY;  Service: Endoscopy;  Laterality: Left;  . FOOT SURGERY  1980's   LEFT, "shot a nail gun thru it" (09/03/2012)  . LACERATION REPAIR  1980's   LEFT HAND  . LEFT HEART CATH AND CORS/GRAFTS ANGIOGRAPHY N/A 08/26/2019   Procedure: LEFT HEART CATH AND CORS/GRAFTS ANGIOGRAPHY;  Surgeon: Martinique, Peter M, MD;  Location: [place] CV LAB;  Service: Cardiovascular;  Laterality: N/A;  . LEFT HEART CATHETERIZATION WITH CORONARY ANGIOGRAM N/A 09/04/2012   Procedure: LEFT HEART CATHETERIZATION WITH CORONARY ANGIOGRAM;  Surgeon: Peter M Martinique, MD;  Location: [facility] CATH LAB;  Service: Cardiovascular;  Laterality: N/A;  . LEFT HEART CATHETERIZATION WITH CORONARY/GRAFT ANGIOGRAM N/A 11/22/2013   Procedure: LEFT HEART CATHETERIZATION WITH Beatrix Fetters;  Surgeon: Jettie Booze, MD;  Location: [facility] CATH LAB;  Service: Cardiovascular;  Laterality: N/A;  . LEFT HEART CATHETERIZATION WITH CORONARY/GRAFT ANGIOGRAM N/A 12/19/2014   Procedure: LEFT HEART CATHETERIZATION WITH Beatrix Fetters;  Surgeon: Peter M Martinique, MD;  Location: [facility] CATH LAB;  Service: Cardiovascular;  Laterality: N/A;       Family History  Problem Relation Age of Onset  . Cancer Mother        breast  . Hypertension Mother   . Hypertension Father   . Diabetes Other        2 siblings, 1 of whom is deceased  . Hypertension Other   . Cancer Other        Lung Cancer  . Cancer Other        FH of Prostate Cancer 1st degree relative  . CAD Brother     Social History   Tobacco Use  . Smoking status: Former Smoker    Packs/day: 1.00    Years: 37.00    Pack years: 37.00    Types: Cigarettes    Quit date: 10/14/2002    Years since quitting: 17.7  . Smokeless tobacco: Never Used  Vaping Use  . Vaping Use: Never used  Substance Use Topics  . Alcohol use: Yes      Alcohol/week: 0.0 standard drinks    Comment: 6 pack beer per week  . Drug use: No    Home Medications Prior to Admission medications   Medication Sig Start Date End Date Taking? Authorizing Provider  aspirin EC 81 MG tablet Take 81 mg by mouth daily.   Yes [provider]  isosorbide mononitrate (IMDUR) 60 MG 24 hr tablet Take 1.5 tablets (90 mg total) by mouth daily. 02/21/20  Yes Martinique, Peter M, MD  LEVEMIR FLEXTOUCH 100 UNIT/ML Pen Inject 45 Units into the skin daily.  01/15/16  Yes [provider]  losartan-hydrochlorothiazide (HYZAAR) 100-25 MG tablet Take 1 tablet by mouth daily. 06/14/19  Yes Duke, Tami Lin, PA  metoprolol succinate (TOPROL-XL) 25 MG 24 hr tablet Take 25 mg by mouth daily. 06/03/20  Yes [provider]  Multiple Vitamin (MULTIVITAMIN WITH MINERALS) TABS Take 1 tablet by mouth daily.   Yes [provider]  nitroGLYCERIN (NITROSTAT) 0.4 MG SL tablet Place 0.4 mg under the tongue every 5 (five) minutes as needed for chest pain.   Yes [provider]  pantoprazole (PROTONIX) 40 MG tablet Take 1 tablet (40 mg total) by mouth 2 (two) times daily. Patient taking differently: Take 40 mg by mouth daily as needed.  01/28/14  Yes Biagio Borg, MD  Pramoxine-Camphor-Zinc Acetate (ANTI [facility] EX) Apply 1 application topically daily as needed (itching).    Yes [provider]  prasugrel (EFFIENT) 10 MG TABS tablet Take 1 tablet (10 mg total) by mouth daily. 06/14/19  Yes Duke, Tami Lin, PA  rosuvastatin (CRESTOR) 40 MG tablet Take 1 tablet (40 mg total) by mouth daily. 06/14/19  Yes Duke, Tami Lin, PA  SitaGLIPtin-MetFORMIN HCl [phone] MG TB24 Take 1 tablet by mouth daily. 08/30/19  Yes Isaiah Serge, NP  traMADol (ULTRAM) 50 MG tablet Take 1 tablet (50 mg total) by mouth 3 (three) times daily as needed for moderate pain. 11/01/19  Yes Lendon Colonel, NP  Insulin Pen Needle (BD PEN NEEDLE NANO U/F) 32G X 4 MM  MISC 1 pen by Does not apply route daily. 11/11/13   Kyra Leyland, PA-C    Allergies    Demerol  Review of Systems   Review of Systems  Constitutional: Negative for chills and fever.  HENT: Negative for ear pain and sore throat.   Eyes: Negative for pain and visual disturbance.  Respiratory: Negative for cough and shortness of breath.   Cardiovascular: Positive for chest pain. Negative for palpitations.  Gastrointestinal: Negative for abdominal pain and vomiting.  Genitourinary: Negative for dysuria and hematuria.  Musculoskeletal: Negative for arthralgias and back pain.  Skin: Negative for color change and rash.  Neurological: Positive for dizziness and weakness. Negative for seizures and syncope.  All other systems reviewed and are negative.   Physical Exam Updated Vital Signs  ED Triage Vitals  Enc Vitals Group     BP 07/16/20 1205 (!) 86/64     Pulse Rate 07/16/20 1208 64     Resp 07/16/20 1205 15     Temp --      Temp Source 07/16/20 1208 Oral     SpO2 07/16/20 1159 100 %     Weight 07/16/20 1209 181 lb (82.1 kg)     Height 07/16/20 1209 5' 7.5" (1.715 m)     Head Circumference --      Peak Flow --      Pain Score 07/16/20 1209 0     Pain Loc --      Pain Edu? --      Excl. in [employer]? --     Physical Exam Vitals and nursing note reviewed.  Constitutional:      General: He is not in acute distress.    Appearance: He is well-developed. He is ill-appearing.  HENT:     Head: Normocephalic and atraumatic.  Eyes:     Extraocular Movements: Extraocular movements intact.     Conjunctiva/sclera: Conjunctivae normal.     Pupils: Pupils are equal, round, and reactive to light.  Cardiovascular:     Rate and Rhythm: Regular rhythm. Bradycardia present.     Pulses:  Radial pulses are 1+ on the right side and 1+ on the left side.       Dorsalis pedis pulses are 1+ on the right side and 1+ on the left side.     Heart sounds: Normal heart sounds. No murmur heard.    Pulmonary:     Effort: Pulmonary effort is normal. No respiratory distress.     Breath sounds: Normal breath sounds. No decreased breath sounds, wheezing, rhonchi or rales.  Abdominal:     Palpations: Abdomen is soft.     Tenderness: There is no abdominal tenderness.  Musculoskeletal:        General: Normal range of motion.     Cervical back: Normal range of motion and neck supple.     Right lower leg: No edema.     Left lower leg: No edema.  Skin:    General: Skin is warm and dry.     Capillary Refill: Capillary refill takes 2 to 3 seconds.  Neurological:     General: No focal deficit present.     Mental Status: He is alert and oriented to person, place, and time.     Cranial Nerves: No cranial nerve deficit.     Motor: No weakness.     Comments: 5+ out of 5 strength throughout, normal sensation, no drift, normal speech  Psychiatric:        Mood and Affect: Mood normal.     ED Results / Procedures / Treatments   Labs (all labs ordered are listed, but only abnormal results are displayed) Labs Reviewed  LACTIC ACID, PLASMA - Abnormal; Notable for the following components:      Result Value   Lactic Acid, Venous 2.0 (*)    All other components within normal limits  COMPREHENSIVE METABOLIC PANEL - Abnormal; Notable for the following components:   Glucose, Bld 114 (*)    Creatinine, Ser 1.48 (*)    Calcium 8.6 (*)    Total Protein 5.9 (*)    Albumin 3.4 (*)    GFR calc non Af Amer 47 (*)    GFR calc Af Amer 54 (*)    All other components within normal limits  CBC WITH DIFFERENTIAL/PLATELET - Abnormal; Notable for the following components:   Hemoglobin 11.8 (*)    HCT 36.8 (*)    RDW 16.6 (*)    Platelets 144 (*)    All other components within normal limits  RESPIRATORY PANEL BY RT PCR (FLU A&B, COVID)  CULTURE, BLOOD (SINGLE)  URINE CULTURE  PROTIME-INR  APTT  URINALYSIS, ROUTINE W REFLEX MICROSCOPIC  LIPASE, BLOOD  D-DIMER, QUANTITATIVE (NOT AT [facility])  LACTIC  ACID, PLASMA  CBG MONITORING, ED  POC OCCULT BLOOD, ED  TROPONIN I (HIGH SENSITIVITY)  TROPONIN I (HIGH SENSITIVITY)    EKG EKG Interpretation  Date/Time:  Sunday July 16 2020 11:57:05 EDT Ventricular Rate:  64 PR Interval:    QRS Duration: 97 QT Interval:  464 QTC Calculation: 479 R Axis:   27 Text Interpretation: Sinus rhythm Inferior infarct, old Lateral leads are also involved Confirmed by Lennice Sites [phone]) on 07/16/2020 12:31:33 PM   Radiology DG Chest Port 1 View  Result Date: 07/16/2020 CLINICAL DATA:  Possible sepsis EXAM: PORTABLE CHEST 1 VIEW COMPARISON:  08/14/2018 FINDINGS: Mild bibasilar opacities, likely atelectasis. No focal consolidation. No pleural effusion or pneumothorax. Cardiomegaly.  Postsurgical changes related to prior CABG. Cervical spine fixation hardware.  Median sternotomy. IMPRESSION: Mild bibasilar opacities, likely atelectasis. Electronically Signed  By: Julian Hy M.D.   On: 07/16/2020 13:03   CT Angio Chest/Abd/Pel for Dissection W and/or Wo Contrast  Result Date: 07/16/2020 CLINICAL DATA:  Chest pain, dizziness, hypotension, aortic dissection suspected EXAM: CT ANGIOGRAPHY CHEST, ABDOMEN AND PELVIS TECHNIQUE: Non-contrast CT of the chest was initially obtained. Multidetector CT imaging through the chest, abdomen and pelvis was performed using the standard protocol during bolus administration of intravenous contrast. Multiplanar reconstructed images and MIPs were obtained and reviewed to evaluate the vascular anatomy. CONTRAST:  174mL OMNIPAQUE IOHEXOL 350 MG/ML SOLN COMPARISON:  100 mL Omnipaque 350 iodinated contrast IV FINDINGS: CTA CHEST FINDINGS Cardiovascular: Preferential opacification of the thoracic aorta. Normal contour and caliber of the thoracic aorta. No evidence of aneurysm, dissection, or other acute aortic pathology. Normal heart size. Three-vessel coronary artery calcifications. Status post median sternotomy and CABG no  pericardial effusion. Mediastinum/Nodes: No enlarged mediastinal, hilar, or axillary lymph nodes. Thyroid gland, trachea, and esophagus demonstrate no significant findings. Lungs/Pleura: Diffuse bilateral bronchial wall thickening. No pleural effusion or pneumothorax. Musculoskeletal: No chest wall abnormality. No acute or significant osseous findings. Review of the MIP images confirms the above findings. CTA ABDOMEN AND PELVIS FINDINGS VASCULAR Normal contour and caliber of the abdominal aorta without evidence of aneurysm, dissection, or other acute aortic pathology. Standard branching pattern of the abdominal aorta with solitary bilateral renal arteries. Atherosclerosis of the iliac branch vessels. Review of the MIP images confirms the above findings. NON-VASCULAR Hepatobiliary: No solid liver abnormality is seen. No gallstones, gallbladder wall thickening, or biliary dilatation. Pancreas: Unremarkable. No pancreatic ductal dilatation or surrounding inflammatory changes. Spleen: Normal in size without significant abnormality. Adrenals/Urinary Tract: Adrenal glands are unremarkable. Kidneys are normal, without renal calculi, solid lesion, or hydronephrosis. Thickening of the urinary bladder. Stomach/Bowel: Stomach is within normal limits. Appendix appears normal. No evidence of bowel wall thickening, distention, or inflammatory changes. Generally large burden of stool throughout the colon and rectum. Descending and sigmoid diverticulosis. Lymphatic: No enlarged abdominal or pelvic lymph nodes. Reproductive: Mild prostatomegaly. Other: No abdominal wall hernia or abnormality. No abdominopelvic ascites. Musculoskeletal: No acute or significant osseous findings. Review of the MIP images confirms the above findings. IMPRESSION: 1. Normal contour and caliber of the thoracic and abdominal aorta without evidence of aneurysm, dissection, or other acute aortic pathology. Aortic Atherosclerosis (ICD10-I70.0). 2. Diffuse  bilateral bronchial wall thickening, consistent with nonspecific infectious or inflammatory bronchitis. 3. Generally large burden of stool throughout the colon and rectum. 4. Descending and sigmoid diverticulosis without evidence of diverticulitis. 5. Mild prostatomegaly with thickening of the urinary bladder wall, likely due to chronic outlet obstruction. Electronically Signed   By: Eddie Candle M.D.   On: 07/16/2020 14:30    Procedures .Critical Care Performed by: Lennice Sites, DO Authorized by: Lennice Sites, DO   Critical care provider statement:    Critical care time (minutes):  45   Critical care was necessary to treat or prevent imminent or life-threatening deterioration of the following conditions:  Circulatory failure and dehydration   Critical care was time spent personally by me on the following activities:  Blood draw for specimens, development of treatment plan with patient or surrogate, discussions with primary provider, evaluation of patient's response to treatment, examination of patient, obtaining history from patient or surrogate, ordering and performing treatments and interventions, ordering and review of radiographic studies, ordering and review of laboratory studies, pulse oximetry, re-evaluation of patient's condition and review of old charts   I assumed direction of critical care for  this patient from another provider in my specialty: no     (including critical care time)  Medications Ordered in ED Medications  sodium chloride 0.9 % bolus 2,000 mL (0 mLs Intravenous Stopped 07/16/20 1320)  iohexol (OMNIPAQUE) 350 MG/ML injection 100 mL (100 mLs Intravenous Contrast Given 07/16/20 1416)    ED Course  I have reviewed the triage vital signs and the nursing notes.  Pertinent labs & imaging results that were available during my care of the patient were reviewed by me and considered in my medical decision making (see chart for details).    MDM Rules/Calculators/A&P                           BRANNON LEVENE is a 74 year old male with history of alcohol abuse, high cholesterol, hypertension, CAD status post CABG, diabetes, high cholesterol who presents to the ED with dizziness, chest pain, hypotension.  Upon arrival patient hypotensive in the 83E systolic.  Mildly hypothermic to 96.1.  Otherwise unremarkable vitals.  Normal room air oxygenation.  Patient had episode of dizziness and chest pain and took nitroglycerin prior to EMS arrival.  Has been hypotensive with them.  EKG shows sinus rhythm.  No obvious ischemic changes.  No heart block.  Chest pain-free currently.  No shortness of breath.  Generally feeling weak.  Overall has 1+ pulses throughout.  No abdominal tenderness.  Clear breath sounds.  Normal neurological exam.  Concern for dissection, ACS, infectious process/sepsis.  Blood pressure improved with initial IV fluid bolus.  Will hold on any antibiotics at this time as undifferentiated cause of hypotension.  Could be secondary to nitroglycerin use as well.  Upon chart review patient did have PCI to multiple vessels last year with cardiology.  He has grossly brown stool on exam.  Less likely GI bleed.  Will order dissection study including troponin, sepsis work-up.  2 L IV fluid bolus to be given.  Quick bedside ultrasound showed no obvious large pericardial effusion, no significant decrease in EF and no obvious cardiogenic shock.  Initial troponin within normal limits.  Urinalysis with no infection.  Chest x-ray with no obvious infection or pneumothorax or pleural effusion.  Creatinine mildly elevated at 1.48.  Otherwise no significant electrolyte abnormality.  No significant leukocytosis or anemia.  Patient had brown stool on exam.  D-dimer normal and doubt PE.  Lactic acid was 2.  Covid test and influenza testing negative.  Dissection study was overall unremarkable.  Patient had some nonspecific infectious or inflammatory bronchitis seen.  Some large stool burden.   However no infectious process.  Does not endorse a cough or shortness of breath per se.  Blood pressure has stabilized after IV fluids.  Patient is feeling better.  Will admit for further syncope work-up.  Would benefit from serial troponins, echocardiogram, telemetry.  Not sure if this was a cardiac arrhythmia that caused dizziness and hypotension today.  He has not had any evidence of heart block on EKGs.  Less likely infectious process.  We will add a head CT to further evaluate for syncope.  However neurologically intact.  This chart was dictated using voice recognition software.  Despite best efforts to proofread,  errors can occur which can change the documentation meaning.   Final Clinical Impression(s) / ED Diagnoses Final diagnoses:  Hypotension, unspecified hypotension type  Near syncope  Chest pain, unspecified type    Rx / DC Orders ED Discharge Orders    None  Lennice Sites, DO 07/16/20 1509
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
## 2020-07-16 NOTE — ED Notes (Signed)
Pt dropping sats while restin.  Placed on 2L for support

## 2020-07-16 NOTE — ED Triage Notes (Signed)
Pt BIB [facility] for report of CP/ZDizzyness/nausea.  PT took 1 nitro.  PT felt worse after. PT hypotensive at 608/30 with EMS. EMS could not palpate radial pulses.  Pt received approx 300 mL NS with EMS.

## 2020-07-17 ENCOUNTER — Inpatient Hospital Stay (HOSPITAL_COMMUNITY): Payer: Medicare HMO

## 2020-07-17 DIAGNOSIS — I340 Nonrheumatic mitral (valve) insufficiency: Secondary | ICD-10-CM

## 2020-07-17 DIAGNOSIS — I200 Unstable angina: Secondary | ICD-10-CM

## 2020-07-17 DIAGNOSIS — Z951 Presence of aortocoronary bypass graft: Secondary | ICD-10-CM

## 2020-07-17 DIAGNOSIS — N400 Enlarged prostate without lower urinary tract symptoms: Secondary | ICD-10-CM

## 2020-07-17 DIAGNOSIS — N179 Acute kidney failure, unspecified: Secondary | ICD-10-CM

## 2020-07-17 DIAGNOSIS — R079 Chest pain, unspecified: Secondary | ICD-10-CM

## 2020-07-17 DIAGNOSIS — I2510 Atherosclerotic heart disease of native coronary artery without angina pectoris: Secondary | ICD-10-CM

## 2020-07-17 LAB — COMPREHENSIVE METABOLIC PANEL
ALT: 20 U/L (ref 0–44)
AST: 22 U/L (ref 15–41)
Albumin: 3 g/dL — ABNORMAL LOW (ref 3.5–5.0)
Alkaline Phosphatase: 46 U/L (ref 38–126)
Anion gap: 7 (ref 5–15)
BUN: 14 mg/dL (ref 8–23)
CO2: 23 mmol/L (ref 22–32)
Calcium: 8.2 mg/dL — ABNORMAL LOW (ref 8.9–10.3)
Chloride: 111 mmol/L (ref 98–111)
Creatinine, Ser: 1.37 mg/dL — ABNORMAL HIGH (ref 0.61–1.24)
GFR calc Af Amer: 60 mL/min — ABNORMAL LOW (ref >60)
GFR calc non Af Amer: 52 mL/min — ABNORMAL LOW (ref >60)
Glucose, Bld: 109 mg/dL — ABNORMAL HIGH (ref 70–99)
Potassium: 3.1 mmol/L — ABNORMAL LOW (ref 3.5–5.1)
Sodium: 141 mmol/L (ref 135–145)
Total Bilirubin: 0.3 mg/dL (ref 0.3–1.2)
Total Protein: 5.6 g/dL — ABNORMAL LOW (ref 6.5–8.1)

## 2020-07-17 LAB — CBC
HCT: 37 % — ABNORMAL LOW (ref 39.0–52.0)
Hemoglobin: 12 g/dL — ABNORMAL LOW (ref 13.0–17.0)
MCH: 28 pg (ref 26.0–34.0)
MCHC: 32.4 g/dL (ref 30.0–36.0)
MCV: 86.2 fL (ref 80.0–100.0)
Platelets: 126 10*3/uL — ABNORMAL LOW (ref 150–400)
RBC: 4.29 MIL/uL (ref 4.22–5.81)
RDW: 16.8 % — ABNORMAL HIGH (ref 11.5–15.5)
WBC: 4.8 10*3/uL (ref 4.0–10.5)
nRBC: 0 % (ref 0.0–0.2)

## 2020-07-17 LAB — ECHOCARDIOGRAM COMPLETE
Area-P 1/2: 3.77 cm2
Height: 67.5 in
S' Lateral: 2.8 cm
Weight: 2816 oz

## 2020-07-17 LAB — URINE CULTURE: Culture: NO GROWTH

## 2020-07-17 LAB — GLUCOSE, CAPILLARY
Glucose-Capillary: 81 mg/dL (ref 70–99)
Glucose-Capillary: 137 mg/dL — ABNORMAL HIGH (ref 70–99)
Glucose-Capillary: 100 mg/dL — ABNORMAL HIGH (ref 70–99)
Glucose-Capillary: 130 mg/dL — ABNORMAL HIGH (ref 70–99)

## 2020-07-17 LAB — OCCULT BLOOD, POC DEVICE: Fecal Occult Bld: NEGATIVE

## 2020-07-17 LAB — LACTIC ACID, PLASMA
Lactic Acid, Venous: 1.3 mmol/L (ref 0.5–1.9)
Lactic Acid, Venous: 1.1 mmol/L (ref 0.5–1.9)

## 2020-07-17 MED ORDER — POTASSIUM CHLORIDE CRYS ER 20 MEQ PO TBCR
40.0000 meq | EXTENDED_RELEASE_TABLET | Freq: Once | ORAL | Status: AC
  Administered 2020-07-17: 40 meq via ORAL
  Filled 2020-07-17: qty 2

## 2020-07-17 MED ORDER — METOPROLOL SUCCINATE ER 25 MG PO TB24
25.0000 mg | ORAL_TABLET | Freq: Every day | ORAL | Status: DC
  Administered 2020-07-17 – 2020-07-20 (×4): 25 mg via ORAL
  Filled 2020-07-17 (×4): qty 1

## 2020-07-17 MED ORDER — ISOSORBIDE MONONITRATE ER 60 MG PO TB24
90.0000 mg | ORAL_TABLET | Freq: Every day | ORAL | Status: DC
  Administered 2020-07-17 – 2020-07-20 (×4): 90 mg via ORAL
  Filled 2020-07-17 (×5): qty 1
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
## 2020-07-17 NOTE — Evaluation (Signed)
Physical Therapy Evaluation Patient Details Name: Antonio [name] MRN: [id number] DOB: Mar 02, 1946 Today's Date: 07/17/2020   History of Present Illness  74 y.o. male presenting after near syncopal episode after walking 2 miles with suspicion for orthostatic hypotension 2/2 polypharmacy and AKI likely 2/2 dehydration. Imaging (-) for pneumonai, PE/aortic dissection.   Clinical Impression  Pt I with functional mobility tasks, cueing needed to remember IV pole; Therapist perform orthostatic BP on pt and informed RN of increase in BP; pt returned to bed due to elevated BP; pt at baseline no further PT needs noted    Follow Up Recommendations No PT follow up    Equipment Recommendations  None recommended by PT    Recommendations for Other Services       Precautions / Restrictions Precautions Precautions: Fall Restrictions Weight Bearing Restrictions: No      Mobility  Bed Mobility Overal bed mobility: Independent                Transfers Overall transfer level: Independent                  Ambulation/Gait Ambulation/Gait assistance: Independent Gait Distance (Feet): 15 Feet Assistive device: IV Pole       General Gait Details: pt forgot he had IV requiring verbal cueing; pt I with ambulation and managing IV pole  Stairs            Wheelchair Mobility    Modified Rankin (Stroke Patients Only)       Balance Overall balance assessment: Independent                                           Pertinent Vitals/Pain Pain Assessment: No/denies pain    Home Living Family/patient expects to be discharged to:: Private residence Living Arrangements: Spouse/significant other;Other relatives Available Help at Discharge: Family;Available PRN/intermittently Type of Home: House Home Access: Ramped entrance     Home Layout: One level Home Equipment: Bedside commode;Grab bars - tub/shower;Grab bars - toilet;Hand held shower head       Prior Function Level of Independence: Independent               Hand Dominance   Dominant Hand: Right    Extremity/Trunk Assessment   Upper Extremity Assessment Upper Extremity Assessment: Overall WFL for tasks assessed    Lower Extremity Assessment Lower Extremity Assessment: Overall WFL for tasks assessed    Cervical / Trunk Assessment Cervical / Trunk Assessment: Normal  Communication   Communication: No difficulties  Cognition Arousal/Alertness: Awake/alert Behavior During Therapy: WFL for tasks assessed/performed Overall Cognitive Status: Within Functional Limits for tasks assessed                                        General Comments General comments (skin integrity, edema, etc.): RN made aware of increase in BP    Exercises     Assessment/Plan    PT Assessment Patent does not need any further PT services  PT Problem List         PT Treatment Interventions      PT Goals (Current goals can be found in the Care Plan section)  Acute Rehab PT Goals Patient Stated Goal: To return home.  PT Goal Formulation: With patient Time For Goal Achievement: 07/17/20  Potential to Achieve Goals: Good    Frequency     Barriers to discharge        Co-evaluation               AM-PAC PT "6 Clicks" Mobility  Outcome Measure Help needed turning from your back to your side while in a flat bed without using bedrails?: None Help needed moving from lying on your back to sitting on the side of a flat bed without using bedrails?: None Help needed moving to and from a bed to a chair (including a wheelchair)?: None Help needed standing up from a chair using your arms (e.g., wheelchair or bedside chair)?: None Help needed to walk in hospital room?: None Help needed climbing 3-5 steps with a railing? : None 6 Click Score: 24    End of Session Equipment Utilized During Treatment: Gait belt Activity Tolerance: Patient tolerated treatment  well Patient left: in bed Nurse Communication: Mobility status      Time: 8264-1583 PT Time Calculation (min) (ACUTE ONLY): 12 min   Charges:   PT Evaluation $PT Eval Low Complexity: [place], DPT Acute Rehabilitation Services [phone]  [place] 07/17/2020, 11:50 AM

## 2020-07-17 NOTE — Progress Notes (Signed)
Echocardiogram 2D Echocardiogram has been performed.  Antonio [name] Antonio [name] 07/17/2020, 9:18 AM

## 2020-07-17 NOTE — Progress Notes (Signed)
Occupational Therapy Evaluation Patient Details Name: Antonio [name] MRN: [id number] DOB: 1945/11/12 Today's Date: 07/17/2020    History of Present Illness 74 y.o. male presenting after near syncopal episode after walking 2 miles with suspicion for orthostatic hypotension 2/2 polypharmacy and AKI likely 2/2 dehydration. Imaging (-) for pneumonai, PE/aortic dissection.    Clinical Impression   PTA patient was independent with BADLs/IADLs without AD and was the primary caregiver for his wife. Patient currently presents at baseline level of function demonstrating bed mobility, short-distance ambulation in room, and toileting tasks with independence. Patient does not currently require continued acute occupational therapy services with OT to sign off at this time.     Follow Up Recommendations  No OT follow up    Equipment Recommendations  None recommended by OT    Recommendations for Other Services       Precautions / Restrictions Precautions Precautions: Fall Restrictions Weight Bearing Restrictions: No      Mobility Bed Mobility Overal bed mobility: Independent                Transfers Overall transfer level: Independent                    Balance Overall balance assessment: No apparent balance deficits (not formally assessed)                                         ADL either performed or assessed with clinical judgement   ADL Overall ADL's : At baseline                                       General ADL Comments: Patient able to walk from EOB to commode in bathroom with assist for IV management only. Patient completed toilet transfer and hand hygiene standing at sink independently.      Vision Baseline Vision/History: No visual deficits Vision Assessment?: No apparent visual deficits     Perception     Praxis      Pertinent Vitals/Pain Pain Assessment: No/denies pain     Hand Dominance Right    Extremity/Trunk Assessment Upper Extremity Assessment Upper Extremity Assessment: Overall WFL for tasks assessed   Lower Extremity Assessment Lower Extremity Assessment: Defer to PT evaluation   Cervical / Trunk Assessment Cervical / Trunk Assessment: Normal   Communication     Cognition Arousal/Alertness: Awake/alert Behavior During Therapy: WFL for tasks assessed/performed Overall Cognitive Status: Within Functional Limits for tasks assessed                                     General Comments       Exercises     Shoulder Instructions      Home Living Family/patient expects to be discharged to:: Private residence Living Arrangements: Spouse/significant other;Other relatives (2 grandsons 23y.o. and 13 y.o.) Available Help at Discharge: Family;Available PRN/intermittently (Patient cares for his wife) Type of Home: House Home Access: Ramped entrance (Ramp is being built)     Home Layout: One level     Bathroom Shower/Tub: Walk-in Hydrologist: Standard Bathroom Accessibility: No   Home Equipment: Bedside commode;Grab bars - tub/shower;Grab bars - toilet;Hand held shower head  Prior Functioning/Environment Level of Independence: Independent                 OT Problem List:        OT Treatment/Interventions:      OT Goals(Current goals can be found in the care plan section) Acute Rehab OT Goals Patient Stated Goal: To return home.  OT Goal Formulation: With patient  OT Frequency:     Barriers to D/C:            Co-evaluation              AM-PAC OT "6 Clicks" Daily Activity     Outcome Measure Help from another person eating meals?: None Help from another person taking care of personal grooming?: None Help from another person toileting, which includes using toliet, bedpan, or urinal?: None Help from another person bathing (including washing, rinsing, drying)?: None Help from another person to  put on and taking off regular upper body clothing?: None Help from another person to put on and taking off regular lower body clothing?: None 6 Click Score: 24   End of Session Equipment Utilized During Treatment: Gait belt  Activity Tolerance: Patient tolerated treatment well Patient left: in chair;with call bell/phone within reach;with chair alarm set                   Time: 8675-4492 OT Time Calculation (min): 16 min Charges:  OT General Charges $OT Visit: 1 Visit OT Evaluation $OT Eval Low Complexity: 1 Low  Kweku Stankey H. OTR/L Supplemental OT, Department of rehab services [phone]  Sharlie Shreffler R H. 07/17/2020, 11:43 AM

## 2020-07-17 NOTE — Plan of Care (Signed)
  Problem: Nutrition: Goal: Adequate nutrition will be maintained Outcome: Completed/Met   Problem: Coping: Goal: Level of anxiety will decrease Outcome: Completed/Met   Problem: Elimination: Goal: Will not experience complications related to bowel motility Outcome: Completed/Met Goal: Will not experience complications related to urinary retention Outcome: Completed/Met   Problem: Pain Managment: Goal: General experience of comfort will improve Outcome: Completed/Met   Problem: Safety: Goal: Ability to remain free from injury will improve Outcome: Completed/Met   Problem: Skin Integrity: Goal: Risk for impaired skin integrity will decrease Outcome: Completed/Met

## 2020-07-17 NOTE — Progress Notes (Signed)
   07/17/20 1646  Assess: MEWS Score  Temp (!) 102.9 F (39.4 C)  BP (!) 142/73  Pulse Rate 91  Resp 18  SpO2 95 %  Assess: MEWS Score  MEWS Temp 2  MEWS Systolic 0  MEWS Pulse 0  MEWS RR 0  MEWS LOC 0  MEWS Score 2  MEWS Score Color Yellow  Assess: if the MEWS score is Yellow or Red  Were vital signs taken at a resting state? Yes  Focused Assessment Change from prior assessment (see assessment flowsheet)  Early Detection of Sepsis Score *See Row Information* Medium  MEWS guidelines implemented *See Row Information* Yes  Treat  Pain Scale 0-10  Pain Score 0  Take Vital Signs  Increase Vital Sign Frequency  Yellow: Q 2hr X 2 then Q 4hr X 2, if remains yellow, continue Q 4hrs  Escalate  MEWS: Escalate Yellow: discuss with charge nurse/RN and consider discussing with provider and RRT  Notify: Charge Nurse/RN  Name of Charge Nurse/RN Notified Jasmina RN  Date Charge Nurse/RN Notified 07/17/20  Time Charge Nurse/RN Notified 1700  Notify: Provider  Provider Name/Title Swayze MD  Date Provider Notified 07/17/20  Time Provider Notified 1700  Notification Type Page  Notification Reason Change in status  Response See new orders  Date of Provider Response 07/17/20  Time of Provider Response 3383  Notify: Rapid Response  Name of Rapid Response RN Notified N/A  Document  Patient Outcome Not stable and remains on department  Progress note created (see row info) Yes

## 2020-07-17 NOTE — Progress Notes (Addendum)
PROGRESS NOTE  [name] QTM:[id number] DOB: 22-Sep-1946 DOA: 07/16/2020 PCP: Antonio Dy, MD  Brief History   Antonio [name] is a 74 y.o. male with medical history significant of coronary artery disease status post CABG (1996) and stents x2 on 08/27/2019 with DES, hypertension, hyperlipidemia, type 2 diabetes mellitus, GERD, BPH presents to emergency department with sudden onset of left-sided chest pain and dizziness.  Patient tells me that he ran 2 miles this morning and when he got home he suddenly developed left-sided chest pain, 9 out of 10, nonradiating, feels like indigestion type of pain, associated with dizziness, nausea and feels like he was about to pass out.  He took nitro however his symptoms started getting worse.  His wife checked his blood pressure and was noted to be low therefore she called EMS.  His blood pressure was noted to be 60/30 with EMS patient was given normal saline bolus in route to ED.  Patient denies headache, blurry vision, syncope, seizures, slurred speech, shortness of breath, palpitation, leg swelling, orthopnea, PND, fever, chills, vomiting, abdominal pain, urinary or bowel changes.  No history of smoking, alcohol, illicit drug use.  ED Course: Upon arrival to ED: Patient hypothermic with temperature of 96, hypotensive, no leukocytosis, lactic acid: 2.0, CMP shows AKI, platelet: 144, COVID-19 negative, UA negative for infection, D-dimer: WNL, lipase, initial troponin, PT/INR, APTT: WNL, chest x-ray: Mild bibasilar opacities likely atelectasis.  CTA chest/abdomen/pelvis shows: No PE/aortic dissection.  Patient was given IV fluid bolus in ED.  CT head is ordered and is pending.  [facility] hospitalist consulted for admission due to hypotension.  The patient was admitted to a telemetry bed. Echocardiogram was obtained which demonstrated EF of 55% with hypokinesis of the basal inferior wall. Cardiology was consulted to evaluate.  Consultants   . Cardiology  Procedures  . None  Antibiotics   Anti-infectives (From admission, onward)   None    .  Subjective  The patient is resting comfortably. No ne complaints.  Objective   Vitals:  Vitals:   07/16/20 2300 07/17/20 0426  BP: 130/74 140/81  Pulse: 63 88  Resp: 18 18  Temp: 98.4 F (36.9 C) 98.4 F (36.9 C)  SpO2: 96% 96%    Exam:  Constitutional:  . The patient is awake, alert, and oriented x 3. No acute distress. Respiratory:  . No increased work of breathing. . No wheezes, rales, or rhonchi . No tactile fremitus Cardiovascular:  . Regular rate and rhythm . No murmurs, ectopy, or gallups. . No lateral PMI. No thrills. Abdomen:  . Abdomen is soft, non-tender, non-distended . No hernias, masses, or organomegaly . Normoactive bowel sounds.  Musculoskeletal:  . No cyanosis, clubbing, or edema Skin:  . No rashes, lesions, ulcers . palpation of skin: no induration or nodules Neurologic:  . CN 2-12 intact . Sensation all 4 extremities intact Psychiatric:  . Mental status o Mood, affect appropriate o Orientation to person, place, time  . judgment and insight appear intact  I have personally reviewed the following:   Today's Data  . Vitals, CMP, CBC  Imaging  . CXR: Atelectasis  Cardiology Data  . Echocardiogram  Scheduled Meds: . aspirin EC  81 mg Oral Daily  . enoxaparin (LOVENOX) injection  40 mg Subcutaneous Q24H  . insulin aspart  0-15 Units Subcutaneous TID WC  . insulin aspart  0-5 Units Subcutaneous QHS  . insulin detemir  45 Units Subcutaneous Daily  . pantoprazole  40 mg Oral Daily  .  prasugrel  10 mg Oral Daily  . rosuvastatin  40 mg Oral Daily   Continuous Infusions: . sodium chloride 125 mL/hr at 07/17/20 8119    Principal Problem:   Hypotension Active Problems:   Type II diabetes mellitus (HCC)   Hyperlipidemia associated with type 2 diabetes mellitus (HCC)   HTN (hypertension)   BPH (benign prostatic  hyperplasia)   CAD (coronary artery disease)   HLD (hyperlipidemia)   S/P CABG (coronary artery bypass graft)   GERD (gastroesophageal reflux disease)   AKI (acute kidney injury) ([place])   Thrombocytopenia (HCC)   LOS: 1 day   A & P  Near syncope: Could be orthostatic hypotension due to polypharmacy vs cardiac syncope given hypotension upon admission. Pt has been admitted to a telemetry bed. Cardiology has been consulted given the patient's abnormal echocardiogram.   Hypotension: Patient hypotensive upon arrival to ED.  Received IV fluid bolus x2 in ED.  Chest x-ray negative for pneumonia, CT chest/abdomen/pelvis negative for PE/aortic dissection.  CT head is ordered and is pending.  Troponin x2 -.  EKG no acute changes.  Lactic acidosis: 2, no leukocytosis, D-dimer: WNL, UA negative for infection.    - Check orthostatic vitals, TSH, magnesium, transthoracic echo . -Consult PT/OT -Continue IV fluids.  Hold antihypertensive medication -Fall precautions.  Hypothermia:  -Patient's temperature noted to be 96 upon arrival to ED.  He was placed on [facility].  Current temperature: 97.6.  - Work-up for infectious cause negative so far.  No leukocytosis, chest x-ray negative, UA negative, respiratory panel: Negative, CTA chest/abdomen/pelvis: Negative for acute findings. -Blood culture, urine culture: Pending.  Hold off antibiotics for now.  AKI: -Likely secondary to dehydration.  Continue IV fluids.  Hold nephrotoxic medications.  Type 2 diabetes mellitus: Check A1c -Hold Janumet, start patient on sliding scale insulin.  Continue Levemir.  Monitor blood sugar closely  Hypertension: Patient hypotensive upon arrival -Hold Hyzaar, metoprolol, Imdur.  Monitor blood pressure closely.  Resume home BP meds once blood pressure is back to baseline  Coronary artery disease status post CABG: Initial troponin negative.  EKG: No acute changes.  Trend troponin.  Continue home meds-aspirin,  statin, Effient.  Hold metoprolol and Hyzaar due to hypotension.  GERD: Continue PPI  Thrombocytopenia: Platelet: 144.  No signs of active bleeding.  Repeat CBC tomorrow a.m.  Hyperlipidemia: Continue statin  DVT prophylaxis: Lovenox/SCD.   code Status: Full code  family Communication: Patient's wife present at bedside.  Plan of care discussed with patient in length and he verbalized understanding and agreed with it. Disposition Plan: Home in 1 to 2 days   Antonio Tatlock, DO [facility] Direct contact: see [email or web address]  7PM-7AM contact night coverage as above 07/17/2020, 12:41 PM  LOS: 1 day   ADDENDUM: I was contacted by nursing late 07/17/2020 that the patient had developed a fever with tachycardia and tachypnea. Blood cultures x 2 were ordered as was a Lactic acid. Lactic acid was 1.3 and 1.1.

## 2020-07-18 ENCOUNTER — Inpatient Hospital Stay (HOSPITAL_COMMUNITY): Payer: Medicare HMO

## 2020-07-18 DIAGNOSIS — N179 Acute kidney failure, unspecified: Secondary | ICD-10-CM | POA: Diagnosis not present

## 2020-07-18 DIAGNOSIS — R079 Chest pain, unspecified: Secondary | ICD-10-CM | POA: Diagnosis not present

## 2020-07-18 DIAGNOSIS — Z951 Presence of aortocoronary bypass graft: Secondary | ICD-10-CM | POA: Diagnosis not present

## 2020-07-18 LAB — BASIC METABOLIC PANEL
Anion gap: 7 (ref 5–15)
BUN: 13 mg/dL (ref 8–23)
CO2: 23 mmol/L (ref 22–32)
Calcium: 8.3 mg/dL — ABNORMAL LOW (ref 8.9–10.3)
Chloride: 110 mmol/L (ref 98–111)
Creatinine, Ser: 1.13 mg/dL (ref 0.61–1.24)
GFR calc Af Amer: >60 mL/min (ref >60)
GFR calc non Af Amer: >60 mL/min (ref >60)
Glucose, Bld: 103 mg/dL — ABNORMAL HIGH (ref 70–99)
Potassium: 3.4 mmol/L — ABNORMAL LOW (ref 3.5–5.1)
Sodium: 140 mmol/L (ref 135–145)

## 2020-07-18 LAB — CBC WITH DIFFERENTIAL/PLATELET
Abs Immature Granulocytes: 0.02 10*3/uL (ref 0.00–0.07)
Basophils Absolute: 0 10*3/uL (ref 0.0–0.1)
Basophils Relative: 0 %
Eosinophils Absolute: 0.2 10*3/uL (ref 0.0–0.5)
Eosinophils Relative: 3 %
HCT: 36.2 % — ABNORMAL LOW (ref 39.0–52.0)
Hemoglobin: 11.8 g/dL — ABNORMAL LOW (ref 13.0–17.0)
Immature Granulocytes: 0 %
Lymphocytes Relative: 17 %
Lymphs Abs: 1.2 10*3/uL (ref 0.7–4.0)
MCH: 28.2 pg (ref 26.0–34.0)
MCHC: 32.6 g/dL (ref 30.0–36.0)
MCV: 86.4 fL (ref 80.0–100.0)
Monocytes Absolute: 0.4 10*3/uL (ref 0.1–1.0)
Monocytes Relative: 6 %
Neutro Abs: 5.4 10*3/uL (ref 1.7–7.7)
Neutrophils Relative %: 74 %
Platelets: 122 10*3/uL — ABNORMAL LOW (ref 150–400)
RBC: 4.19 MIL/uL — ABNORMAL LOW (ref 4.22–5.81)
RDW: 16.6 % — ABNORMAL HIGH (ref 11.5–15.5)
WBC: 7.3 10*3/uL (ref 4.0–10.5)
nRBC: 0 % (ref 0.0–0.2)

## 2020-07-18 LAB — NM MYOCAR MULTI W/SPECT W/WALL MOTION / EF
Estimated workload: 1 METS
Exercise duration (min): 0 min
Exercise duration (sec): 0 s
MPHR: 149 {beats}/min
Peak HR: 76 {beats}/min
Percent HR: 51 %
Rest HR: 56 {beats}/min

## 2020-07-18 LAB — GLUCOSE, CAPILLARY
Glucose-Capillary: 108 mg/dL — ABNORMAL HIGH (ref 70–99)
Glucose-Capillary: 87 mg/dL (ref 70–99)
Glucose-Capillary: 124 mg/dL — ABNORMAL HIGH (ref 70–99)
Glucose-Capillary: 62 mg/dL — ABNORMAL LOW (ref 70–99)
Glucose-Capillary: 74 mg/dL (ref 70–99)

## 2020-07-18 MED ORDER — POTASSIUM CHLORIDE CRYS ER 20 MEQ PO TBCR
40.0000 meq | EXTENDED_RELEASE_TABLET | Freq: Once | ORAL | Status: AC
  Administered 2020-07-18: 40 meq via ORAL
  Filled 2020-07-18: qty 2

## 2020-07-18 MED ORDER — SODIUM CHLORIDE 0.9 % IV SOLN
1.0000 g | INTRAVENOUS | Status: DC
  Administered 2020-07-18: 1 g via INTRAVENOUS
  Filled 2020-07-18: qty 10

## 2020-07-18 MED ORDER — REGADENOSON 0.4 MG/5ML IV SOLN
0.4000 mg | Freq: Once | INTRAVENOUS | Status: AC
  Filled 2020-07-18: qty 5

## 2020-07-18 MED ORDER — TECHNETIUM TC 99M TETROFOSMIN IV KIT
10.8000 | PACK | Freq: Once | INTRAVENOUS | Status: AC | PRN
  Administered 2020-07-18: 10.8 via INTRAVENOUS

## 2020-07-18 MED ORDER — INSULIN DETEMIR 100 UNIT/ML ~~LOC~~ SOLN
30.0000 [IU] | Freq: Every day | SUBCUTANEOUS | Status: DC
  Administered 2020-07-19: 30 [IU] via SUBCUTANEOUS
  Filled 2020-07-18: qty 0.3

## 2020-07-18 MED ORDER — TECHNETIUM TC 99M TETROFOSMIN IV KIT
32.2000 | PACK | Freq: Once | INTRAVENOUS | Status: AC | PRN
  Administered 2020-07-18: 32.2 via INTRAVENOUS

## 2020-07-18 MED ORDER — SODIUM CHLORIDE 0.9 % IV SOLN
2.0000 g | INTRAVENOUS | Status: DC
  Administered 2020-07-19: 2 g via INTRAVENOUS
  Filled 2020-07-18: qty 20

## 2020-07-18 MED ORDER — SODIUM CHLORIDE 0.9 % IV SOLN
500.0000 mg | INTRAVENOUS | Status: DC
  Administered 2020-07-18: 500 mg via INTRAVENOUS
  Filled 2020-07-18 (×3): qty 500

## 2020-07-18 MED ORDER — REGADENOSON 0.4 MG/5ML IV SOLN
INTRAVENOUS | Status: AC
  Administered 2020-07-18: 0.4 mg
  Filled 2020-07-18: qty 5

## 2020-07-18 NOTE — Progress Notes (Signed)
PROGRESS NOTE  Antonio [name] YQI:[id number] DOB: 1945-11-19 DOA: 07/16/2020 PCP: [name], MD  Brief History   Antonio [name] is a 74 y.o. male with medical history significant of coronary artery disease status post CABG (1996) and stents x2 on 08/27/2019 with DES, hypertension, hyperlipidemia, type 2 diabetes mellitus, GERD, BPH presents to emergency department with sudden onset of left-sided chest pain and dizziness.  Patient tells me that he ran 2 miles this morning and when he got home he suddenly developed left-sided chest pain, 9 out of 10, nonradiating, feels like indigestion type of pain, associated with dizziness, nausea and feels like he was about to pass out.  He took nitro however his symptoms started getting worse.  His wife checked his blood pressure and was noted to be low therefore she called EMS.  His blood pressure was noted to be 60/30 with EMS patient was given normal saline bolus in route to ED.  Patient denies headache, blurry vision, syncope, seizures, slurred speech, shortness of breath, palpitation, leg swelling, orthopnea, PND, fever, chills, vomiting, abdominal pain, urinary or bowel changes.  No history of smoking, alcohol, illicit drug use.  ED Course: Upon arrival to ED: Patient hypothermic with temperature of 96, hypotensive, no leukocytosis, lactic acid: 2.0, CMP shows AKI, platelet: 144, COVID-19 negative, UA negative for infection, D-dimer: WNL, lipase, initial troponin, PT/INR, APTT: WNL, chest x-ray: Mild bibasilar opacities likely atelectasis.  CTA chest/abdomen/pelvis shows: No PE/aortic dissection.  Patient was given IV fluid bolus in ED.  CT head is ordered and is pending.  [facility] hospitalist consulted for admission due to hypotension.  The patient was admitted to a telemetry bed. Echocardiogram was obtained which demonstrated EF of 55% with hypokinesis of the basal inferior wall. Cardiology was consulted to evaluate.  The patient underwent  stress imaging with cardiology today. He was found to have a fixed inferior defect/scar that is unchanged compared to previous study. Recommendation from cardiology is that he be discharged on low dose losartan without HCTZ on discharge depending on BP.   Consultants  . Cardiology  Procedures  . None  Antibiotics   Anti-infectives (From admission, onward)   Start     Dose/Rate Route Frequency Ordered Stop   07/19/20 1000  cefTRIAXone (ROCEPHIN) 2 g in sodium chloride 0.9 % 100 mL IVPB        2 g 200 mL/hr over 30 Minutes Intravenous Every 24 hours 07/18/20 1416     07/18/20 1000  cefTRIAXone (ROCEPHIN) 1 g in sodium chloride 0.9 % 100 mL IVPB  Status:  Discontinued        1 g 200 mL/hr over 30 Minutes Intravenous Every 24 hours 07/18/20 0802 07/18/20 1416   07/18/20 1000  azithromycin (ZITHROMAX) 500 mg in sodium chloride 0.9 % 250 mL IVPB        500 mg 250 mL/hr over 60 Minutes Intravenous Every 24 hours 07/18/20 0802       Subjective  The patient is resting comfortably. No new complaints.  Objective   Vitals:  Vitals:   07/18/20 1057 07/18/20 1630  BP: 126/76 121/71  Pulse:  60  Resp:  16  Temp:  98.3 F (36.8 C)  SpO2:  100%    Exam:  Constitutional:  . The patient is awake, alert, and oriented x 3. No acute distress. Respiratory:  . No increased work of breathing. . No wheezes, rales, or rhonchi . No tactile fremitus Cardiovascular:  . Regular rate and rhythm . No murmurs,  ectopy, or gallups. . No lateral PMI. No thrills. Abdomen:  . Abdomen is soft, non-tender, non-distended . No hernias, masses, or organomegaly . Normoactive bowel sounds.  Musculoskeletal:  . No cyanosis, clubbing, or edema Skin:  . No rashes, lesions, ulcers . palpation of skin: no induration or nodules Neurologic:  . CN 2-12 intact . Sensation all 4 extremities intact Psychiatric:  . Mental status o Mood, affect appropriate o Orientation to person, place, time  . judgment and  insight appear intact  I have personally reviewed the following:   Today's Data  . Vitals, CMP, CBC  Imaging  . CXR: Atelectasis  Cardiology Data  . Echocardiogram  Scheduled Meds: . aspirin EC  81 mg Oral Daily  . enoxaparin (LOVENOX) injection  40 mg Subcutaneous Q24H  . insulin aspart  0-15 Units Subcutaneous TID WC  . insulin aspart  0-5 Units Subcutaneous QHS  . insulin detemir  45 Units Subcutaneous Daily  . isosorbide mononitrate  90 mg Oral Daily  . metoprolol succinate  25 mg Oral Daily  . pantoprazole  40 mg Oral Daily  . prasugrel  10 mg Oral Daily  . rosuvastatin  40 mg Oral Daily   Continuous Infusions: . sodium chloride 125 mL/hr at 07/18/20 1212  . azithromycin 500 mg (07/18/20 1216)  . [START ON 07/19/2020] cefTRIAXone (ROCEPHIN)  IV      Principal Problem:   Hypotension Active Problems:   Type II diabetes mellitus (HCC)   Hyperlipidemia associated with type 2 diabetes mellitus (HCC)   HTN (hypertension)   BPH (benign prostatic hyperplasia)   CAD (coronary artery disease)   HLD (hyperlipidemia)   S/P CABG (coronary artery bypass graft)   GERD (gastroesophageal reflux disease)   AKI (acute kidney injury) ([place])   Thrombocytopenia (HCC)   LOS: 2 days   A & P  Near syncope: Could be orthostatic hypotension due to polypharmacy vs cardiac syncope given hypotension upon admission. Pt has been admitted to a telemetry bed. Cardiology has been consulted given the patient's abnormal echocardiogram. The patient underwent stress imaging with cardiology today. He was found to have a fixed inferior defect/scar that is unchanged compared to previous study. Recommendation from cardiology is that he be discharged on low dose losartan without HCTZ on discharge depending on BP.   Hypotension: Patient hypotensive upon arrival to ED.  Received IV fluid bolus x2 in ED.  Chest x-ray negative for pneumonia, CT chest/abdomen/pelvis negative for PE/aortic dissection.  CT head is  ordered and is pending.  Troponin x2 -.  EKG no acute changes.  Pneumonia: Pt with febrile episode. CXR demonstrated atelectasis vs infiltrate. Due to fever yesterday evening, I have started the patient on IV antibiotics. Monitor. Blood cultures x 2 obtained. No growth.  Lactic acidosis: 2, no leukocytosis, D-dimer: WNL, UA negative for infection.    Check orthostatic vitals, TSH, magnesium, transthoracic echo . Debility: PT/OT evala and treat.Marland Kitchen  Hypothermia: Resolved. Work-up for infectious cause negative so far.  No leukocytosis, , UA negative, respiratory panel: Negative, Pr being treated for CXR empirically. Blood culture, urine culture: No growth.  Hold off antibiotics for now.  AKI: Likely secondary to dehydration.  Continue IV fluids.  Hold nephrotoxic medications.  Type 2 diabetes mellitus: Check A1c. Hold Janumet, start patient on sliding scale insulin.  Levemir dosse decreased due to hypoglycemia.  Monitor blood sugar closely  Hypertension: Patient hypotensive upon arrival. Hold Hyzaar, metoprolol, Imdur.  Monitor blood pressure closely.  Resume home BP meds once  blood pressure is back to baseline.  Coronary artery disease status post CABG: Initial troponin negative.  EKG: No acute changes.  Trend troponin.  Continue home meds-aspirin, statin, Effient.  Hold metoprolol and Hyzaar due to hypotension.  GERD: Continue PPI  Thrombocytopenia: Platelet: 144.  No signs of active bleeding.  Repeat CBC tomorrow a.m.  Hyperlipidemia: Continue statin  I have seen and examined this patient myself. I have spent 35 minutes in his evaluation and care.  DVT prophylaxis: Lovenox/SCD.   code Status: Full code  family Communication: None available.  Disposition Plan: Home in 1 to 2 days  Deivi Huckins, DO [facility] Direct contact: see [email or web address]  7PM-7AM contact night coverage as above 07/17/2020, 12:41 PM  LOS: 1 day

## 2020-07-18 NOTE — Plan of Care (Signed)
  Problem: Clinical Measurements: Goal: Will remain free from infection Outcome: Progressing   Problem: Activity: Goal: Risk for activity intolerance will decrease Outcome: Progressing   

## 2020-07-18 NOTE — Plan of Care (Signed)

## 2020-07-18 NOTE — Progress Notes (Signed)
   Patient presented for a nuclear stress test today. No immediate complications. Stress imaging is pending at this time.   Preliminary EKG findings may be listed in the chart, but the stress test result will not be finalized until perfusion imaging is complete.  Darreld Mclean, PA-C 07/18/2020 11:06 AM

## 2020-07-18 NOTE — Progress Notes (Addendum)
Progress Note  Patient Name: Antonio [name] Date of Encounter: 07/18/2020  [facility] Cardiologist: Peter Martinique, MD   Subjective   Saw patient down in nuclear medicine. Feeling better today. He denies any chest pain, shortness of breath, or palpitations this morning.  Inpatient Medications    Scheduled Meds: . aspirin EC  81 mg Oral Daily  . enoxaparin (LOVENOX) injection  40 mg Subcutaneous Q24H  . insulin aspart  0-15 Units Subcutaneous TID WC  . insulin aspart  0-5 Units Subcutaneous QHS  . insulin detemir  45 Units Subcutaneous Daily  . isosorbide mononitrate  90 mg Oral Daily  . metoprolol succinate  25 mg Oral Daily  . pantoprazole  40 mg Oral Daily  . prasugrel  10 mg Oral Daily  . regadenoson  0.4 mg Intravenous Once  . rosuvastatin  40 mg Oral Daily   Continuous Infusions: . sodium chloride 125 mL/hr at 07/18/20 0246  . azithromycin    . cefTRIAXone (ROCEPHIN)  IV 1 g (07/18/20 0850)   PRN Meds: acetaminophen **OR** acetaminophen, alum & mag hydroxide-simeth, ondansetron **OR** ondansetron (ZOFRAN) IV   Vital Signs    Vitals:   07/18/20 1038 07/18/20 1053 07/18/20 1055 07/18/20 1057  BP: 125/84 107/79 125/70 126/76  Pulse:      Resp:      Temp:      TempSrc:      SpO2:      Weight:      Height:        Intake/Output Summary (Last 24 hours) at 07/18/2020 1107 Last data filed at 07/18/2020 0840 Gross per 24 hour  Intake 340 ml  Output 2520 ml  Net -2180 ml   Last 3 Weights 07/18/2020 07/17/2020 07/16/2020  Weight (lbs) 176 lb 11.2 oz 176 lb 181 lb  Weight (kg) 80.151 kg 79.833 kg 82.1 kg      Telemetry    Unable to review telemetry down in nuclear medicine but in normal sinus rhythm/mild sinus bradycardia during stress test.  - Personally Reviewed  ECG    No new ECG tracing today. - Personally Reviewed  Physical Exam   GEN: No acute distress.   Neck: Supple. Cardiac: Mildly bradycardic. No murmurs, rubs, or gallops. Radial pulses 2+  and equal bilaterally. Respiratory: Clear to auscultation bilaterally. No wheezes, rhonchi, or rales. GI: Soft, non-tender, non-distended  MS: No lower extremity edema. No deformity. Neuro:  No focal deficits.  Psych: Normal affect. Responds appropriately.  Labs    High Sensitivity Troponin:   Recent Labs  Lab 07/16/20 1240 07/16/20 1448  TROPONINIHS 7 8      Chemistry Recent Labs  Lab 07/16/20 1240 07/16/20 1240 07/16/20 1638 07/17/20 0418 07/18/20 0350  NA 140  --   --  141 140  K 3.5  --   --  3.1* 3.4*  CL 107  --   --  111 110  CO2 24  --   --  23 23  GLUCOSE 114*  --   --  109* 103*  BUN 16  --   --  14 13  CREATININE 1.48*   < > 1.36* 1.37* 1.13  CALCIUM 8.6*  --   --  8.2* 8.3*  PROT 5.9*  --   --  5.6*  --   ALBUMIN 3.4*  --   --  3.0*  --   AST 25  --   --  22  --   ALT 21  --   --  20  --   ALKPHOS 39  --   --  46  --   BILITOT 0.9  --   --  0.3  --   GFRNONAA 47*   < > 52* 52* >60  GFRAA 54*   < > >60 60* >60  ANIONGAP 9  --   --  7 7   < > = values in this interval not displayed.     Hematology Recent Labs  Lab 07/16/20 1703 07/17/20 0418 07/18/20 0350  WBC 7.4 4.8 7.3  RBC 4.55 4.29 4.19*  HGB 12.8* 12.0* 11.8*  HCT 39.5 37.0* 36.2*  MCV 86.8 86.2 86.4  MCH 28.1 28.0 28.2  MCHC 32.4 32.4 32.6  RDW 16.8* 16.8* 16.6*  PLT 158 126* 122*    BNPNo results for input(s): BNP, PROBNP in the last 168 hours.   DDimer  Recent Labs  Lab 07/16/20 1240  DDIMER 0.41     Radiology    CT Head Wo Contrast  Result Date: 07/16/2020 CLINICAL DATA:  Dizziness and nausea. EXAM: CT HEAD WITHOUT CONTRAST TECHNIQUE: Contiguous axial images were obtained from the base of the skull through the vertex without intravenous contrast. COMPARISON:  August 14, 2018 FINDINGS: Brain: There is mild cerebral atrophy with widening of the extra-axial spaces and ventricular dilatation. There are areas of decreased attenuation within the white matter tracts of the  supratentorial brain, consistent with microvascular disease changes. Vascular: No hyperdense vessel or unexpected calcification. Skull: Normal. Negative for fracture or focal lesion. Sinuses/Orbits: No acute finding. A small chronic deformity is seen along the medial wall of the left orbit. Other: None. IMPRESSION: 1. Generalized cerebral atrophy. 2. No acute intracranial abnormality. Electronically Signed   By: Virgina Norfolk M.D.   On: 07/16/2020 16:29   DG Chest Port 1 View  Result Date: 07/16/2020 CLINICAL DATA:  Possible sepsis EXAM: PORTABLE CHEST 1 VIEW COMPARISON:  08/14/2018 FINDINGS: Mild bibasilar opacities, likely atelectasis. No focal consolidation. No pleural effusion or pneumothorax. Cardiomegaly.  Postsurgical changes related to prior CABG. Cervical spine fixation hardware.  Median sternotomy. IMPRESSION: Mild bibasilar opacities, likely atelectasis. Electronically Signed   By: Julian Hy M.D.   On: 07/16/2020 13:03   ECHOCARDIOGRAM COMPLETE  Result Date: 07/17/2020    ECHOCARDIOGRAM REPORT   Patient Name:   Antonio [name] Date of Exam: 07/17/2020 Medical Rec #:  [id number]        Height:       67.5 in Accession #:    [id number]       Weight:       176.0 lb Date of Birth:  Nov 10, 1945       BSA:          1.926 m Patient Age:    74 years         BP:           140/81 mmHg Patient Gender: M                HR:           71 bpm. Exam Location:  Inpatient Procedure: 2D Echo, Color Doppler and Cardiac Doppler Indications:    R07.9* Chest pain, unspecified  History:        Patient has prior history of Echocardiogram examinations, most                 recent 11/18/2013. Prior CABG; Risk Factors:Hypertension, Diabetes  and Dyslipidemia.  Sonographer:    Raquel Sarna Senior RDCS Referring Phys: [id number] Mckinley Jewel  Sonographer Comments: Poor apical window. IMPRESSIONS  1. Apex not well visualized but may be hypokinetic Basal inferior wall hypokinesis . Left ventricular ejection  fraction, by estimation, is 55%. The left ventricle has normal function. The left ventricle has no regional wall motion abnormalities. Left ventricular diastolic parameters were normal.  2. Right ventricular systolic function is normal. The right ventricular size is normal. There is normal pulmonary artery systolic pressure.  3. Left atrial size was mildly dilated.  4. The mitral valve is normal in structure. Mild mitral valve regurgitation. No evidence of mitral stenosis.  5. Tricuspid valve regurgitation is moderate.  6. The aortic valve is normal in structure. Aortic valve regurgitation is trivial. No aortic stenosis is present.  7. The inferior vena cava is normal in size with greater than 50% respiratory variability, suggesting right atrial pressure of 3 mmHg. FINDINGS  Left Ventricle: Apex not well visualized but may be hypokinetic Basal inferior wall hypokinesis. Left ventricular ejection fraction, by estimation, is 55%. The left ventricle has normal function. The left ventricle has no regional wall motion abnormalities. The left ventricular internal cavity size was normal in size. There is no left ventricular hypertrophy. Left ventricular diastolic parameters were normal. Right Ventricle: The right ventricular size is normal. No increase in right ventricular wall thickness. Right ventricular systolic function is normal. There is normal pulmonary artery systolic pressure. The tricuspid regurgitant velocity is 2.63 m/s, and  with an assumed right atrial pressure of 8 mmHg, the estimated right ventricular systolic pressure is 66.0 mmHg. Left Atrium: Left atrial size was mildly dilated. Right Atrium: Right atrial size was normal in size. Pericardium: There is no evidence of pericardial effusion. Mitral Valve: The mitral valve is normal in structure. Mild mitral valve regurgitation. No evidence of mitral valve stenosis. Tricuspid Valve: The tricuspid valve is normal in structure. Tricuspid valve regurgitation is  moderate . No evidence of tricuspid stenosis. Aortic Valve: The aortic valve is normal in structure. Aortic valve regurgitation is trivial. No aortic stenosis is present. Pulmonic Valve: The pulmonic valve was normal in structure. Pulmonic valve regurgitation is not visualized. No evidence of pulmonic stenosis. Aorta: The aortic root is normal in size and structure. Venous: The inferior vena cava is normal in size with greater than 50% respiratory variability, suggesting right atrial pressure of 3 mmHg. IAS/Shunts: No atrial level shunt detected by color flow Doppler.  LEFT VENTRICLE PLAX 2D LVIDd:         4.10 cm  Diastology LVIDs:         2.80 cm  LV e' medial:    6.42 cm/s LV PW:         1.30 cm  LV E/e' medial:  14.1 LV IVS:        1.10 cm  LV e' lateral:   9.46 cm/s LVOT diam:     2.00 cm  LV E/e' lateral: 9.6 LV SV:         57 LV SV Index:   30 LVOT Area:     3.14 cm  RIGHT VENTRICLE RV S prime:     8.49 cm/s TAPSE (M-mode): 1.6 cm LEFT ATRIUM             Index       RIGHT ATRIUM           Index LA diam:        3.80 cm  1.97 cm/m  RA Area:     17.20 cm LA Vol (A2C):   60.7 ml 31.52 ml/m RA Volume:   49.00 ml  25.44 ml/m LA Vol (A4C):   60.8 ml 31.57 ml/m LA Biplane Vol: 63.2 ml 32.82 ml/m  AORTIC VALVE LVOT Vmax:   80.30 cm/s LVOT Vmean:  52.300 cm/s LVOT VTI:    0.182 m  AORTA Ao Root diam: 3.50 cm Ao Asc diam:  3.40 cm MITRAL VALVE               TRICUSPID VALVE MV Area (PHT): 3.77 cm    TR Peak grad:   27.7 mmHg MV Decel Time: 201 msec    TR Vmax:        263.00 cm/s MV E velocity: 90.40 cm/s MV A velocity: 66.00 cm/s  SHUNTS MV E/A ratio:  1.37        Systemic VTI:  0.18 m                            Systemic Diam: 2.00 cm Jenkins Rouge MD Electronically signed by Jenkins Rouge MD Signature Date/Time: 07/17/2020/9:24:26 AM    Final    CT Angio Chest/Abd/Pel for Dissection W and/or Wo Contrast  Result Date: 07/16/2020 CLINICAL DATA:  Chest pain, dizziness, hypotension, aortic dissection suspected EXAM:  CT ANGIOGRAPHY CHEST, ABDOMEN AND PELVIS TECHNIQUE: Non-contrast CT of the chest was initially obtained. Multidetector CT imaging through the chest, abdomen and pelvis was performed using the standard protocol during bolus administration of intravenous contrast. Multiplanar reconstructed images and MIPs were obtained and reviewed to evaluate the vascular anatomy. CONTRAST:  122mL OMNIPAQUE IOHEXOL 350 MG/ML SOLN COMPARISON:  100 mL Omnipaque 350 iodinated contrast IV FINDINGS: CTA CHEST FINDINGS Cardiovascular: Preferential opacification of the thoracic aorta. Normal contour and caliber of the thoracic aorta. No evidence of aneurysm, dissection, or other acute aortic pathology. Normal heart size. Three-vessel coronary artery calcifications. Status post median sternotomy and CABG no pericardial effusion. Mediastinum/Nodes: No enlarged mediastinal, hilar, or axillary lymph nodes. Thyroid gland, trachea, and esophagus demonstrate no significant findings. Lungs/Pleura: Diffuse bilateral bronchial wall thickening. No pleural effusion or pneumothorax. Musculoskeletal: No chest wall abnormality. No acute or significant osseous findings. Review of the MIP images confirms the above findings. CTA ABDOMEN AND PELVIS FINDINGS VASCULAR Normal contour and caliber of the abdominal aorta without evidence of aneurysm, dissection, or other acute aortic pathology. Standard branching pattern of the abdominal aorta with solitary bilateral renal arteries. Atherosclerosis of the iliac branch vessels. Review of the MIP images confirms the above findings. NON-VASCULAR Hepatobiliary: No solid liver abnormality is seen. No gallstones, gallbladder wall thickening, or biliary dilatation. Pancreas: Unremarkable. No pancreatic ductal dilatation or surrounding inflammatory changes. Spleen: Normal in size without significant abnormality. Adrenals/Urinary Tract: Adrenal glands are unremarkable. Kidneys are normal, without renal calculi, solid  lesion, or hydronephrosis. Thickening of the urinary bladder. Stomach/Bowel: Stomach is within normal limits. Appendix appears normal. No evidence of bowel wall thickening, distention, or inflammatory changes. Generally large burden of stool throughout the colon and rectum. Descending and sigmoid diverticulosis. Lymphatic: No enlarged abdominal or pelvic lymph nodes. Reproductive: Mild prostatomegaly. Other: No abdominal wall hernia or abnormality. No abdominopelvic ascites. Musculoskeletal: No acute or significant osseous findings. Review of the MIP images confirms the above findings. IMPRESSION: 1. Normal contour and caliber of the thoracic and abdominal aorta without evidence of aneurysm, dissection, or other acute aortic pathology. Aortic Atherosclerosis (ICD10-I70.0). 2. Diffuse  bilateral bronchial wall thickening, consistent with nonspecific infectious or inflammatory bronchitis. 3. Generally large burden of stool throughout the colon and rectum. 4. Descending and sigmoid diverticulosis without evidence of diverticulitis. 5. Mild prostatomegaly with thickening of the urinary bladder wall, likely due to chronic outlet obstruction. Electronically Signed   By: Eddie Candle M.D.   On: 07/16/2020 14:30    Cardiac Studies   Echocardiogram 07/17/2020: Impressions: 1. Apex not well visualized but may be hypokinetic Basal inferior wall  hypokinesis . Left ventricular ejection fraction, by estimation, is 55%.  The left ventricle has normal function. The left ventricle has no regional  wall motion abnormalities. Left  ventricular diastolic parameters were normal.  2. Right ventricular systolic function is normal. The right ventricular  size is normal. There is normal pulmonary artery systolic pressure.  3. Left atrial size was mildly dilated.  4. The mitral valve is normal in structure. Mild mitral valve  regurgitation. No evidence of mitral stenosis.  5. Tricuspid valve regurgitation is moderate.    6. The aortic valve is normal in structure. Aortic valve regurgitation is  trivial. No aortic stenosis is present.  7. The inferior vena cava is normal in size with greater than 50%  respiratory variability, suggesting right atrial pressure of 3 mmHg.  Patient Profile     74 y.o. male with a history of CAD as s/p remote CABG in 1996 with subsequent PCI/DES to SVT-Diag and SVG-PDA grafts in 08/2019, hypertension, hyperlipidemia diabetes mellitus, and prior GI bleeding who is being seen for evaluation of chest pain at the request of Dr. Benny Lennert.  Assessment & Plan    Chest Pain History of CAD s/p remote CABG with DES to SVG-Diag and SVG-PDA in 08/2019 - EKG shows no acute ischemic changes.  - High-sensitivity troponin negative x2. - Echo showed LVEF of 55% with basal inferior wall hypokinesis.  - No recurrent chest pain this morning. - Continue Aspirin 81mg  daily, Effient 10mg  daily, Toprol-XL 25mg  daily, and Imdur 90mg  daily. - Patient had Myoview today. Results pending.  Hypertension - History of hypertension but was hypotensive on arrival with BP of 86/64. BP improved with IV fluids.  - Continue Toprol and Imdur as above. - Renal function has improved but will hold off on resuming home Losartan-HCTZ for now.  Hyperlipidemia - Lipid panel in 08/2019: Total Cholesterol 124, Triglycerides 76, HDL 41, LDL 68. - Continue Crestor 40mg  daily.   Diabetes Mellitus - Management per primary team.   AKI - Likely due to dehydration/hypotension. Resolved with fluids.  - Continue to holed home Losartan-HCTZ for now. - Continue to monitor closely.  Hypokalemia - Potassium 3.1 yesterday. Repleted and 3.4 today. - Will give another dose of K-Dur 40 mEq today. - Continue to monitor closely.  Otherwise, per primary team.   For questions or updates, please contact [place] Please consult [email or web address] for contact info under        Signed, Darreld Mclean, PA-C  07/18/2020,  11:07 AM

## 2020-07-19 DIAGNOSIS — I959 Hypotension, unspecified: Secondary | ICD-10-CM

## 2020-07-19 DIAGNOSIS — I951 Orthostatic hypotension: Secondary | ICD-10-CM

## 2020-07-19 DIAGNOSIS — J189 Pneumonia, unspecified organism: Principal | ICD-10-CM

## 2020-07-19 DIAGNOSIS — I10 Essential (primary) hypertension: Secondary | ICD-10-CM

## 2020-07-19 DIAGNOSIS — R55 Syncope and collapse: Secondary | ICD-10-CM

## 2020-07-19 LAB — GLUCOSE, CAPILLARY
Glucose-Capillary: 68 mg/dL — ABNORMAL LOW (ref 70–99)
Glucose-Capillary: 108 mg/dL — ABNORMAL HIGH (ref 70–99)
Glucose-Capillary: 81 mg/dL (ref 70–99)
Glucose-Capillary: 106 mg/dL — ABNORMAL HIGH (ref 70–99)

## 2020-07-19 LAB — CBC WITH DIFFERENTIAL/PLATELET
Abs Immature Granulocytes: 0.01 10*3/uL (ref 0.00–0.07)
Basophils Absolute: 0 10*3/uL (ref 0.0–0.1)
Basophils Relative: 0 %
Eosinophils Absolute: 0.2 10*3/uL (ref 0.0–0.5)
Eosinophils Relative: 4 %
HCT: 37.8 % — ABNORMAL LOW (ref 39.0–52.0)
Hemoglobin: 12.2 g/dL — ABNORMAL LOW (ref 13.0–17.0)
Immature Granulocytes: 0 %
Lymphocytes Relative: 29 %
Lymphs Abs: 1.2 10*3/uL (ref 0.7–4.0)
MCH: 27.4 pg (ref 26.0–34.0)
MCHC: 32.3 g/dL (ref 30.0–36.0)
MCV: 84.9 fL (ref 80.0–100.0)
Monocytes Absolute: 0.6 10*3/uL (ref 0.1–1.0)
Monocytes Relative: 15 %
Neutro Abs: 2.1 10*3/uL (ref 1.7–7.7)
Neutrophils Relative %: 52 %
Platelets: 124 10*3/uL — ABNORMAL LOW (ref 150–400)
RBC: 4.45 MIL/uL (ref 4.22–5.81)
RDW: 16.6 % — ABNORMAL HIGH (ref 11.5–15.5)
WBC: 4.1 10*3/uL (ref 4.0–10.5)
nRBC: 0 % (ref 0.0–0.2)

## 2020-07-19 MED ORDER — INSULIN DETEMIR 100 UNIT/ML ~~LOC~~ SOLN
10.0000 [IU] | Freq: Two times a day (BID) | SUBCUTANEOUS | Status: DC
  Administered 2020-07-20 (×2): 10 [IU] via SUBCUTANEOUS
  Filled 2020-07-19 (×4): qty 0.1

## 2020-07-19 MED ORDER — METOPROLOL SUCCINATE ER 25 MG PO TB24: 25.0000 mg | ORAL_TABLET | Freq: Every day | ORAL | Status: DC

## 2020-07-19 MED ORDER — AZITHROMYCIN 500 MG PO TABS
500.0000 mg | ORAL_TABLET | Freq: Every day | ORAL | Status: DC
  Administered 2020-07-19 – 2020-07-20 (×2): 500 mg via ORAL
  Filled 2020-07-19 (×2): qty 1

## 2020-07-19 MED ORDER — POLYETHYLENE GLYCOL 3350 17 G PO PACK
17.0000 g | PACK | Freq: Two times a day (BID) | ORAL | Status: DC
  Administered 2020-07-19: 17 g via ORAL
  Filled 2020-07-19 (×3): qty 1

## 2020-07-19 NOTE — Progress Notes (Signed)
PT Cancellation Note  Patient Details Name: Antonio [name] MRN: [id number] DOB: 1946-02-10   Cancelled Treatment:     pt seen 10/4 and at baseline; Pt and RN state pt is I in room and no need for therapy.  Lyanne Co, DPT [facility] [phone]    [place] 07/19/2020, 1:30 PM

## 2020-07-19 NOTE — Progress Notes (Signed)
[facility] PROGRESS NOTE    Progress Note  Antonio [name]  IWP:[id number] DOB: 15-Jun-1946 DOA: 07/16/2020 PCP: [name], MD     Brief Narrative:   Antonio [name] is an 74 y.o. male past medical history of coronary artery disease CABG in 96 stent x2 in 08/26/2020 with a DES stent essential hypertension diabetes mellitus type 2 she presented to the emergency department at an onset of left-sided chest pain and dizziness.  Chest pain was exertional as he went running a mile on the morning of admission his wife check his blood pressure and it was low when EMS arrived his blood pressure was 60/30.  Assessment/Plan:   Near syncope: Question due to infectious etiology Cardiology was consulted recommended a 2D echo and stress test and was found to have a fixed inferior defect with a scar that is unchanged compared to previous. Cardiology recommended no further work-up to be discharged on low-dose losartan without hydrochlorothiazide.  Hypotension: In the ED she was fluid resuscitated, he did started spiking fevers and chest x-ray showed lateral lower lobes opacity.  EKG showed no acute changes.  Question due to antihypertensive medication.  Community-acquired pneumonia: A chest x-ray showed a infiltrate, patient with febrile episode on 07/17/2020 was started empirically on antibiotics. I have personally reviewed the CT angio of the chest and abdomen shows bilateral diffuse infiltrate. Blood cultures have remained negative till date. Check orthostatics and document in chart.  Hypothermia: Resolved, question due to infectious etiology.  Acute kidney injury: Likely prerenal azotemia, holding nephrotoxic agents resolved with IV fluid hydration.  Diabetes mellitus type II: Holding Janumet. Hemoglobin A1c of 7. Currently on long-acting insulin plus sliding scale  Essential hypertension: Due to her hypotension her antihypertensive medication Hyzaar, metoprolol and Imdur  were held.  Coronary artery disease status post CABG: Continue aspirin statins and Brilinta. Will resume metoprolol as her heart rate and blood pressure are improving.  Hyperlipidemia associated with type 2 diabetes mellitus (HCC) Continue statins.  Thrombocytopenia ([place]) Check a CBC with differential today.   DVT prophylaxis: lovenox Family Communication:non Status is: Inpatient  Remains inpatient appropriate because:Hemodynamically unstable   Dispo: The patient is from: Home              Anticipated d/c is to: Home              Anticipated d/c date is: 1 day              Patient currently is not medically stable to d/c.        Code Status:     Code Status Orders  (From admission, onward)         Start     Ordered   07/16/20 1514  Full code  Continuous        07/16/20 1514        Code Status History    Date Active Date Inactive Code Status Order ID Comments User Context   08/26/2019 1228 08/28/2019 1452 Full Code [id number]  [place], Peter M, MD Inpatient   08/14/2018 2133 08/15/2018 1854 DNR [id number]  Lenore Cordia, MD ED   08/09/2015 [id number] 08/09/2015 2049 Full Code [id number]  Lavina Hamman, MD Inpatient   12/19/2014 1737 12/20/2014 1430 Full Code [id number]  [place], Peter M, MD Inpatient   12/17/2014 1546 12/19/2014 1737 Full Code [id number]  Isaiah Serge, NP Inpatient   11/17/2013 2027 11/22/2013 1857 Full Code [id number]  Toy Baker, MD Inpatient   02/12/2012 2151 02/13/2012  2014 Full Code [id number]  Rise Patience, MD Inpatient   Advance Care Planning Activity        IV Access:    Peripheral IV   Procedures and diagnostic studies:   NM Myocar Multi W/Spect W/Wall Motion / EF  Result Date: 07/18/2020  There was no ST segment deviation noted during stress.  No T wave inversion was noted during stress.  Defect 1: There is a medium defect of severe severity present in the basal inferior and mid inferior location.  This is a low risk study.   The left ventricular ejection fraction is normal (55-65%).  Abnormal, low risk stress nuclear study with prior inferior infarct and mild peri-infarct ischemia.  Gated ejection fraction 58% with hypokinesis of the inferior wall.     Medical Consultants:    None.  Anti-Infectives:   IV Rocephin and azithromycin.  Subjective:    Antonio [name] relates his breathing is better feels better than yesterday.  Objective:    Vitals:   07/18/20 1630 07/18/20 2019 07/19/20 0633 07/19/20 0636  BP: 121/71 (!) 168/93  (!) 156/96  Pulse: 60 61  63  Resp: 16 19  20   Temp: 98.3 F (36.8 C) 98.8 F (37.1 C)  98.1 F (36.7 C)  TempSrc: Oral Oral  Oral  SpO2: 100% 100%  100%  Weight:   80.8 kg   Height:       SpO2: 100 % O2 Flow Rate (L/min): 0 L/min   Intake/Output Summary (Last 24 hours) at 07/19/2020 1054 Last data filed at 07/18/2020 2127 Gross per 24 hour  Intake 5262.62 ml  Output 675 ml  Net 4587.62 ml   Filed Weights   07/17/20 0003 07/18/20 0509 07/19/20 0633  Weight: 79.8 kg 80.2 kg 80.8 kg    Exam: General exam: In no acute distress. Respiratory system: Good air movement and diffuse crackles bilaterally Cardiovascular system: S1 & S2 heard, RRR. No JVD. Gastrointestinal system: Abdomen is nondistended, soft and nontender.  Extremities: No pedal edema. Skin: No rashes, lesions or ulcers Psychiatry: Judgement and insight appear normal. Mood & affect appropriate.    Data Reviewed:    Labs: Basic Metabolic Panel: Recent Labs  Lab 07/16/20 1240 07/16/20 1240 07/16/20 1638 07/17/20 0418 07/18/20 0350  NA 140  --   --  141 140  K 3.5   < >  --  3.1* 3.4*  CL 107  --   --  111 110  CO2 24  --   --  23 23  GLUCOSE 114*  --   --  109* 103*  BUN 16  --   --  14 13  CREATININE 1.48*  --  1.36* 1.37* 1.13  CALCIUM 8.6*  --   --  8.2* 8.3*  MG  --   --  1.9  --   --    < > = values in this interval not displayed.   GFR Estimated Creatinine Clearance: 61.7  mL/min (by C-G formula based on SCr of 1.13 mg/dL). Liver Function Tests: Recent Labs  Lab 07/16/20 1240 07/17/20 0418  AST 25 22  ALT 21 20  ALKPHOS 39 46  BILITOT 0.9 0.3  PROT 5.9* 5.6*  ALBUMIN 3.4* 3.0*   Recent Labs  Lab 07/16/20 1240  LIPASE 42   No results for input(s): AMMONIA in the last 168 hours. Coagulation profile Recent Labs  Lab 07/16/20 1240  INR 1.1   COVID-19 Labs  Recent Labs    07/16/20  1240  DDIMER 0.41    Lab Results  Component Value Date   SARSCOV2NAA NEGATIVE 07/16/2020   SARSCOV2NAA NOT DETECTED 08/23/2019    CBC: Recent Labs  Lab 07/16/20 1240 07/16/20 1703 07/17/20 0418 07/18/20 0350  WBC 5.1 7.4 4.8 7.3  NEUTROABS 2.6  --   --  5.4  HGB 11.8* 12.8* 12.0* 11.8*  HCT 36.8* 39.5 37.0* 36.2*  MCV 85.6 86.8 86.2 86.4  PLT 144* 158 126* 122*   Cardiac Enzymes: No results for input(s): CKTOTAL, CKMB, CKMBINDEX, TROPONINI in the last 168 hours. BNP (last 3 results) No results for input(s): PROBNP in the last 8760 hours. CBG: Recent Labs  Lab 07/18/20 0851 07/18/20 1230 07/18/20 1631 07/18/20 2103 07/19/20 0612  GLUCAP 87 124* 62* 74 68*   D-Dimer: Recent Labs    07/16/20 1240  DDIMER 0.41   Hgb A1c: Recent Labs    07/16/20 1240  HGBA1C 7.7*   Lipid Profile: No results for input(s): CHOL, HDL, LDLCALC, TRIG, CHOLHDL, LDLDIRECT in the last 72 hours. Thyroid function studies: Recent Labs    07/16/20 1636  TSH 0.457   Anemia work up: No results for input(s): VITAMINB12, FOLATE, FERRITIN, TIBC, IRON, RETICCTPCT in the last 72 hours. Sepsis Labs: Recent Labs  Lab 07/16/20 1226 07/16/20 1240 07/16/20 1449 07/16/20 1703 07/17/20 0418 07/17/20 1930 07/17/20 2212 07/18/20 0350  WBC  --  5.1  --  7.4 4.8  --   --  7.3  LATICACIDVEN 2.0*  --  1.5  --   --  1.3 1.1  --    Microbiology Recent Results (from the past 240 hour(s))  Urine culture     Status: None   Collection Time: 07/16/20 12:16 PM    Specimen: In/Out Cath Urine  Result Value Ref Range Status   Specimen Description IN/OUT CATH URINE  Final   Special Requests NONE  Final   Culture   Final    NO GROWTH Performed at [facility], [place] [address] [place], [place] [address]    Report Status 07/17/2020 FINAL  Final  Respiratory Panel by RT PCR (Flu A&B, Covid) - Nasopharyngeal Swab     Status: None   Collection Time: 07/16/20 12:24 PM   Specimen: Nasopharyngeal Swab  Result Value Ref Range Status   SARS Coronavirus 2 by RT PCR NEGATIVE NEGATIVE Final    Comment: (NOTE) SARS-CoV-2 target nucleic acids are NOT DETECTED.  The SARS-CoV-2 RNA is generally detectable in upper respiratoy specimens during the acute phase of infection. The lowest concentration of SARS-CoV-2 viral copies this assay can detect is 131 copies/mL. A negative result does not preclude SARS-Cov-2 infection and should not be used as the sole basis for treatment or other patient management decisions. A negative result may occur with  improper specimen collection/handling, submission of specimen other than nasopharyngeal swab, presence of viral mutation(s) within the areas targeted by this assay, and inadequate number of viral copies (<131 copies/mL). A negative result must be combined with clinical observations, patient history, and epidemiological information. The expected result is Negative.  Fact Sheet for Patients:  PinkCheek.be  Fact Sheet for Healthcare Providers:  GravelBags.it  This test is no t yet approved or cleared by the [place] FDA and  has been authorized for detection and/or diagnosis of SARS-CoV-2 by FDA under an Emergency Use Authorization (EUA). This EUA will remain  in effect (meaning this test can be used) for the duration of the COVID-19 declaration under Section 564(b)(1) of  the Act, 21 U.S.C. section 360bbb-3(b)(1), unless the authorization is terminated  or revoked sooner.     Influenza A by PCR NEGATIVE NEGATIVE Final   Influenza B by PCR NEGATIVE NEGATIVE Final    Comment: (NOTE) The Xpert Xpress SARS-CoV-2/FLU/RSV assay is intended as an aid in  the diagnosis of influenza from Nasopharyngeal swab specimens and  should not be used as a sole basis for treatment. Nasal washings and  aspirates are unacceptable for Xpert Xpress SARS-CoV-2/FLU/RSV  testing.  Fact Sheet for Patients: PinkCheek.be  Fact Sheet for Healthcare Providers: GravelBags.it  This test is not yet approved or cleared by the [place] FDA and  has been authorized for detection and/or diagnosis of SARS-CoV-2 by  FDA under an Emergency Use Authorization (EUA). This EUA will remain  in effect (meaning this test can be used) for the duration of the  Covid-19 declaration under Section 564(b)(1) of the Act, 21  U.S.C. section 360bbb-3(b)(1), unless the authorization is  terminated or revoked. Performed at [facility], [place] [address] [place], [place] [address]   Blood culture (routine single)     Status: None (Preliminary result)   Collection Time: 07/16/20 12:25 PM   Specimen: BLOOD LEFT FOREARM  Result Value Ref Range Status   Specimen Description BLOOD LEFT FOREARM  Final   Special Requests   Final    BOTTLES DRAWN AEROBIC AND ANAEROBIC Blood Culture results may not be optimal due to an inadequate volume of blood received in culture bottles   Culture   Final    NO GROWTH 3 DAYS Performed at [facility], [place] [address] [place], [place] [address]    Report Status PENDING  Incomplete  Culture, blood (routine x 2)     Status: None (Preliminary result)   Collection Time: 07/17/20  7:30 PM   Specimen: BLOOD LEFT HAND  Result Value Ref Range Status   Specimen Description BLOOD LEFT HAND  Final   Special Requests   Final    BOTTLES DRAWN AEROBIC AND ANAEROBIC Blood Culture adequate volume     Culture   Final    NO GROWTH 2 DAYS Performed at [facility], [place] [address] [place], [place] [address]    Report Status PENDING  Incomplete  Culture, blood (routine x 2)     Status: None (Preliminary result)   Collection Time: 07/17/20  7:30 PM   Specimen: BLOOD RIGHT HAND  Result Value Ref Range Status   Specimen Description BLOOD RIGHT HAND  Final   Special Requests   Final    BOTTLES DRAWN AEROBIC ONLY Blood Culture adequate volume   Culture   Final    NO GROWTH 2 DAYS Performed at [facility], [place] [address], [place],  [address]    Report Status PENDING  Incomplete     Medications:   . aspirin EC  81 mg Oral Daily  . azithromycin  500 mg Oral Daily  . enoxaparin (LOVENOX) injection  40 mg Subcutaneous Q24H  . insulin aspart  0-15 Units Subcutaneous TID WC  . insulin aspart  0-5 Units Subcutaneous QHS  . insulin detemir  30 Units Subcutaneous Daily  . isosorbide mononitrate  90 mg Oral Daily  . metoprolol succinate  25 mg Oral Daily  . pantoprazole  40 mg Oral Daily  . prasugrel  10 mg Oral Daily  . rosuvastatin  40 mg Oral Daily   Continuous Infusions: . sodium chloride 125 mL/hr at  07/18/20 2007  . cefTRIAXone (ROCEPHIN)  IV 2 g (07/19/20 1009)      LOS: 3 days   Charlynne Cousins  [facility]  07/19/2020, 10:54 AM

## 2020-07-19 NOTE — Plan of Care (Signed)
  Problem: Education: Goal: Knowledge of General Education information will improve Description: Including pain rating scale, medication(s)/side effects and non-pharmacologic comfort measures Outcome: Progressing   Problem: Activity: Goal: Risk for activity intolerance will decrease Outcome: Progressing   

## 2020-07-20 DIAGNOSIS — E1122 Type 2 diabetes mellitus with diabetic chronic kidney disease: Secondary | ICD-10-CM

## 2020-07-20 DIAGNOSIS — N182 Chronic kidney disease, stage 2 (mild): Secondary | ICD-10-CM

## 2020-07-20 DIAGNOSIS — Z794 Long term (current) use of insulin: Secondary | ICD-10-CM

## 2020-07-20 LAB — GLUCOSE, CAPILLARY: Glucose-Capillary: 154 mg/dL — ABNORMAL HIGH (ref 70–99)

## 2020-07-20 MED ORDER — AZITHROMYCIN 500 MG PO TABS: 500.0000 mg | ORAL_TABLET | Freq: Every day | ORAL | 0 refills | Status: DC

## 2020-07-20 NOTE — Progress Notes (Signed)
Patient's heart rate was 57. This rate has been the pt's trend. Since BP was 172/90, metoprolol given.

## 2020-07-20 NOTE — Plan of Care (Signed)

## 2020-07-20 NOTE — Discharge Summary (Signed)
Physician Discharge Summary  Antonio [name] ZSW:[id number] DOB: [birth year] DOA: 07/16/2020  PCP: [name], MD  Admit date: 07/16/2020 Discharge date: 07/20/2020  Admitted From: Home Disposition:  Home  Recommendations for Outpatient Follow-up:  1. Follow up with PCP in 1-2 weeks 2. Please obtain BMP/CBC in one week   Home Health:No Equipment/Devices:None  Discharge Condition:Stable CODE STATUS:Full Diet recommendation: Heart Healthy   Brief/Interim Summary: 74 y.o. male past medical history of coronary artery disease CABG in 96 stent x2 in 08/26/2020 with a DES stent essential hypertension diabetes mellitus type 2 she presented to the emergency department at an onset of left-sided chest pain and dizziness.  Chest pain was exertional as he went running a mile on the morning of admission his wife check his blood pressure and it was low when EMS arrived his blood pressure was 60/30.  Discharge Diagnoses:  Principal Problem:   Hypotension Active Problems:   Type II diabetes mellitus (HCC)   Hyperlipidemia associated with type 2 diabetes mellitus (HCC)   HTN (hypertension)   BPH (benign prostatic hyperplasia)   CAD (coronary artery disease)   HLD (hyperlipidemia)   S/P CABG (coronary artery bypass graft)   GERD (gastroesophageal reflux disease)   AKI (acute kidney injury) ([place])   Thrombocytopenia (HCC) Near syncope: Question infectious etiology cardiology was consulted recommended a 2D echo and a stress test and was found to have fixed inferior defect with scar that is unchanged from previous they recommended no further ischemic work-up. Antihypertensive medications were restarted.  Acquired pneumonia: Chest x-ray was done that showed an infiltrate patient became afebrile on 07/17/2020 started empirically on IV Rocephin azithromycin. I personally reviewed the CT CT angio showed no PE but today showed bilateral diffuse infiltrates. He defervesced, he was changed to oral  azithromycin which she will continue as an outpatient for 3 additional days after discharge.  Hypothermia: Likely due to infectious etiology now resolved.  Acute kidney injury: Likely prerenal azotemia resolved with IV fluid hydration.  Diabetes mellitus type 2: His Janumet was held on admission, his hemoglobin A1c was 7 he was to continue his long-acting insulin plus sliding scale no changes made to his medication home regimen.  Essential hypertension: He was hypotensive on admission with syncope his antihypertensive medications were held his blood pressure started trending up these will resume slowly and he will resume them all as an outpatient.  Coronary artery disease status post CABG: Continue aspirin and Brilinta no changes made to his medication.  Hyperlipidemia: Continue statins.  New number cytopenia: Likely due to infectious etiology it is slowly trending up.  Will need to follow-up with PCP in 2 weeks and recheck it.   Discharge Instructions  Discharge Instructions    Diet - low sodium heart healthy   Complete by: As directed    Increase activity slowly   Complete by: As directed      Allergies as of 07/20/2020      Reactions   Demerol Other (See Comments)   hallucinations      Medication List    TAKE these medications   ANTI ITCH EX Apply 1 application topically daily as needed (itching).   aspirin EC 81 MG tablet Take 81 mg by mouth daily.   azithromycin 500 MG tablet Commonly known as: ZITHROMAX Take 1 tablet (500 mg total) by mouth daily.   Insulin Pen Needle 32G X 4 MM Misc Commonly known as: BD Pen Needle Nano U/F 1 pen by Does not apply route daily.  isosorbide mononitrate 60 MG 24 hr tablet Commonly known as: IMDUR Take 1.5 tablets (90 mg total) by mouth daily.   Levemir FlexTouch 100 UNIT/ML FlexPen Generic drug: insulin detemir Inject 45 Units into the skin daily.   losartan-hydrochlorothiazide 100-25 MG tablet Commonly known as:  HYZAAR Take 1 tablet by mouth daily.   metoprolol succinate 25 MG 24 hr tablet Commonly known as: TOPROL-XL Take 25 mg by mouth daily.   multivitamin with minerals Tabs tablet Take 1 tablet by mouth daily.   nitroGLYCERIN 0.4 MG SL tablet Commonly known as: NITROSTAT Place 0.4 mg under the tongue every 5 (five) minutes as needed for chest pain.   pantoprazole 40 MG tablet Commonly known as: PROTONIX Take 1 tablet (40 mg total) by mouth 2 (two) times daily. What changed:   when to take this  reasons to take this   prasugrel 10 MG Tabs tablet Commonly known as: Effient Take 1 tablet (10 mg total) by mouth daily.   rosuvastatin 40 MG tablet Commonly known as: CRESTOR Take 1 tablet (40 mg total) by mouth daily.   SitaGLIPtin-MetFORMIN HCl [phone] MG Tb24 Take 1 tablet by mouth daily.   traMADol 50 MG tablet Commonly known as: ULTRAM Take 1 tablet (50 mg total) by mouth 3 (three) times daily as needed for moderate pain.       Allergies  Allergen Reactions  . Demerol Other (See Comments)    hallucinations    Consultations:  Cardiology   Procedures/Studies: CT Head Wo Contrast  Result Date: 07/16/2020 CLINICAL DATA:  Dizziness and nausea. EXAM: CT HEAD WITHOUT CONTRAST TECHNIQUE: Contiguous axial images were obtained from the base of the skull through the vertex without intravenous contrast. COMPARISON:  August 14, 2018 FINDINGS: Brain: There is mild cerebral atrophy with widening of the extra-axial spaces and ventricular dilatation. There are areas of decreased attenuation within the white matter tracts of the supratentorial brain, consistent with microvascular disease changes. Vascular: No hyperdense vessel or unexpected calcification. Skull: Normal. Negative for fracture or focal lesion. Sinuses/Orbits: No acute finding. A small chronic deformity is seen along the medial wall of the left orbit. Other: None. IMPRESSION: 1. Generalized cerebral atrophy. 2. No acute  intracranial abnormality. Electronically Signed   By: Virgina Norfolk M.D.   On: 07/16/2020 16:29   NM Myocar Multi W/Spect W/Wall Motion / EF  Result Date: 07/18/2020  There was no ST segment deviation noted during stress.  No T wave inversion was noted during stress.  Defect 1: There is a medium defect of severe severity present in the basal inferior and mid inferior location.  This is a low risk study.  The left ventricular ejection fraction is normal (55-65%).  Abnormal, low risk stress nuclear study with prior inferior infarct and mild peri-infarct ischemia.  Gated ejection fraction 58% with hypokinesis of the inferior wall.   DG Chest Port 1 View  Result Date: 07/16/2020 CLINICAL DATA:  Possible sepsis EXAM: PORTABLE CHEST 1 VIEW COMPARISON:  08/14/2018 FINDINGS: Mild bibasilar opacities, likely atelectasis. No focal consolidation. No pleural effusion or pneumothorax. Cardiomegaly.  Postsurgical changes related to prior CABG. Cervical spine fixation hardware.  Median sternotomy. IMPRESSION: Mild bibasilar opacities, likely atelectasis. Electronically Signed   By: Julian Hy M.D.   On: 07/16/2020 13:03   ECHOCARDIOGRAM COMPLETE  Result Date: 07/17/2020    ECHOCARDIOGRAM REPORT   Patient Name:   Antonio [name] Date of Exam: 07/17/2020 Medical Rec #:  [id number]  Height:       67.5 in Accession #:    [id number]       Weight:       176.0 lb Date of Birth:  1946/05/15       BSA:          1.926 m Patient Age:    66 years         BP:           140/81 mmHg Patient Gender: M                HR:           71 bpm. Exam Location:  Inpatient Procedure: 2D Echo, Color Doppler and Cardiac Doppler Indications:    R07.9* Chest pain, unspecified  History:        Patient has prior history of Echocardiogram examinations, most                 recent 11/18/2013. Prior CABG; Risk Factors:Hypertension, Diabetes                 and Dyslipidemia.  Sonographer:    Raquel Sarna Senior RDCS Referring Phys: [id number]  Mckinley Jewel  Sonographer Comments: Poor apical window. IMPRESSIONS  1. Apex not well visualized but may be hypokinetic Basal inferior wall hypokinesis . Left ventricular ejection fraction, by estimation, is 55%. The left ventricle has normal function. The left ventricle has no regional wall motion abnormalities. Left ventricular diastolic parameters were normal.  2. Right ventricular systolic function is normal. The right ventricular size is normal. There is normal pulmonary artery systolic pressure.  3. Left atrial size was mildly dilated.  4. The mitral valve is normal in structure. Mild mitral valve regurgitation. No evidence of mitral stenosis.  5. Tricuspid valve regurgitation is moderate.  6. The aortic valve is normal in structure. Aortic valve regurgitation is trivial. No aortic stenosis is present.  7. The inferior vena cava is normal in size with greater than 50% respiratory variability, suggesting right atrial pressure of 3 mmHg. FINDINGS  Left Ventricle: Apex not well visualized but may be hypokinetic Basal inferior wall hypokinesis. Left ventricular ejection fraction, by estimation, is 55%. The left ventricle has normal function. The left ventricle has no regional wall motion abnormalities. The left ventricular internal cavity size was normal in size. There is no left ventricular hypertrophy. Left ventricular diastolic parameters were normal. Right Ventricle: The right ventricular size is normal. No increase in right ventricular wall thickness. Right ventricular systolic function is normal. There is normal pulmonary artery systolic pressure. The tricuspid regurgitant velocity is 2.63 m/s, and  with an assumed right atrial pressure of 8 mmHg, the estimated right ventricular systolic pressure is 35.3 mmHg. Left Atrium: Left atrial size was mildly dilated. Right Atrium: Right atrial size was normal in size. Pericardium: There is no evidence of pericardial effusion. Mitral Valve: The mitral valve is  normal in structure. Mild mitral valve regurgitation. No evidence of mitral valve stenosis. Tricuspid Valve: The tricuspid valve is normal in structure. Tricuspid valve regurgitation is moderate . No evidence of tricuspid stenosis. Aortic Valve: The aortic valve is normal in structure. Aortic valve regurgitation is trivial. No aortic stenosis is present. Pulmonic Valve: The pulmonic valve was normal in structure. Pulmonic valve regurgitation is not visualized. No evidence of pulmonic stenosis. Aorta: The aortic root is normal in size and structure. Venous: The inferior vena cava is normal in size with greater than 50% respiratory variability, suggesting right atrial  pressure of 3 mmHg. IAS/Shunts: No atrial level shunt detected by color flow Doppler.  LEFT VENTRICLE PLAX 2D LVIDd:         4.10 cm  Diastology LVIDs:         2.80 cm  LV e' medial:    6.42 cm/s LV PW:         1.30 cm  LV E/e' medial:  14.1 LV IVS:        1.10 cm  LV e' lateral:   9.46 cm/s LVOT diam:     2.00 cm  LV E/e' lateral: 9.6 LV SV:         57 LV SV Index:   30 LVOT Area:     3.14 cm  RIGHT VENTRICLE RV S prime:     8.49 cm/s TAPSE (M-mode): 1.6 cm LEFT ATRIUM             Index       RIGHT ATRIUM           Index LA diam:        3.80 cm 1.97 cm/m  RA Area:     17.20 cm LA Vol (A2C):   60.7 ml 31.52 ml/m RA Volume:   49.00 ml  25.44 ml/m LA Vol (A4C):   60.8 ml 31.57 ml/m LA Biplane Vol: 63.2 ml 32.82 ml/m  AORTIC VALVE LVOT Vmax:   80.30 cm/s LVOT Vmean:  52.300 cm/s LVOT VTI:    0.182 m  AORTA Ao Root diam: 3.50 cm Ao Asc diam:  3.40 cm MITRAL VALVE               TRICUSPID VALVE MV Area (PHT): 3.77 cm    TR Peak grad:   27.7 mmHg MV Decel Time: 201 msec    TR Vmax:        263.00 cm/s MV E velocity: 90.40 cm/s MV A velocity: 66.00 cm/s  SHUNTS MV E/A ratio:  1.37        Systemic VTI:  0.18 m                            Systemic Diam: 2.00 cm Jenkins Rouge MD Electronically signed by Jenkins Rouge MD Signature Date/Time: 07/17/2020/9:24:26 AM     Final    CT Angio Chest/Abd/Pel for Dissection W and/or Wo Contrast  Result Date: 07/16/2020 CLINICAL DATA:  Chest pain, dizziness, hypotension, aortic dissection suspected EXAM: CT ANGIOGRAPHY CHEST, ABDOMEN AND PELVIS TECHNIQUE: Non-contrast CT of the chest was initially obtained. Multidetector CT imaging through the chest, abdomen and pelvis was performed using the standard protocol during bolus administration of intravenous contrast. Multiplanar reconstructed images and MIPs were obtained and reviewed to evaluate the vascular anatomy. CONTRAST:  158mL OMNIPAQUE IOHEXOL 350 MG/ML SOLN COMPARISON:  100 mL Omnipaque 350 iodinated contrast IV FINDINGS: CTA CHEST FINDINGS Cardiovascular: Preferential opacification of the thoracic aorta. Normal contour and caliber of the thoracic aorta. No evidence of aneurysm, dissection, or other acute aortic pathology. Normal heart size. Three-vessel coronary artery calcifications. Status post median sternotomy and CABG no pericardial effusion. Mediastinum/Nodes: No enlarged mediastinal, hilar, or axillary lymph nodes. Thyroid gland, trachea, and esophagus demonstrate no significant findings. Lungs/Pleura: Diffuse bilateral bronchial wall thickening. No pleural effusion or pneumothorax. Musculoskeletal: No chest wall abnormality. No acute or significant osseous findings. Review of the MIP images confirms the above findings. CTA ABDOMEN AND PELVIS FINDINGS VASCULAR Normal contour and caliber of the abdominal aorta without  evidence of aneurysm, dissection, or other acute aortic pathology. Standard branching pattern of the abdominal aorta with solitary bilateral renal arteries. Atherosclerosis of the iliac branch vessels. Review of the MIP images confirms the above findings. NON-VASCULAR Hepatobiliary: No solid liver abnormality is seen. No gallstones, gallbladder wall thickening, or biliary dilatation. Pancreas: Unremarkable. No pancreatic ductal dilatation or surrounding  inflammatory changes. Spleen: Normal in size without significant abnormality. Adrenals/Urinary Tract: Adrenal glands are unremarkable. Kidneys are normal, without renal calculi, solid lesion, or hydronephrosis. Thickening of the urinary bladder. Stomach/Bowel: Stomach is within normal limits. Appendix appears normal. No evidence of bowel wall thickening, distention, or inflammatory changes. Generally large burden of stool throughout the colon and rectum. Descending and sigmoid diverticulosis. Lymphatic: No enlarged abdominal or pelvic lymph nodes. Reproductive: Mild prostatomegaly. Other: No abdominal wall hernia or abnormality. No abdominopelvic ascites. Musculoskeletal: No acute or significant osseous findings. Review of the MIP images confirms the above findings. IMPRESSION: 1. Normal contour and caliber of the thoracic and abdominal aorta without evidence of aneurysm, dissection, or other acute aortic pathology. Aortic Atherosclerosis (ICD10-I70.0). 2. Diffuse bilateral bronchial wall thickening, consistent with nonspecific infectious or inflammatory bronchitis. 3. Generally large burden of stool throughout the colon and rectum. 4. Descending and sigmoid diverticulosis without evidence of diverticulitis. 5. Mild prostatomegaly with thickening of the urinary bladder wall, likely due to chronic outlet obstruction. Electronically Signed   By: Eddie Candle M.D.   On: 07/16/2020 14:30      Subjective: No new complaints  Discharge Exam: Vitals:   07/19/20 2031 07/20/20 0452  BP: 135/81 (!) 142/88  Pulse: (!) 56 (!) 58  Resp: 19 14  Temp: 98.2 F (36.8 C) 98.2 F (36.8 C)  SpO2: 99% 99%   Vitals:   07/19/20 1409 07/19/20 1641 07/19/20 2031 07/20/20 0452  BP: 138/85 119/85 135/81 (!) 142/88  Pulse: 61 (!) 53 (!) 56 (!) 58  Resp:  20 19 14   Temp:  98.5 F (36.9 C) 98.2 F (36.8 C) 98.2 F (36.8 C)  TempSrc:  Oral Oral Oral  SpO2:   99% 99%  Weight:    80.4 kg  Height:        General: Pt  is alert, awake, not in acute distress Cardiovascular: RRR, S1/S2 +, no rubs, no gallops Respiratory: CTA bilaterally, no wheezing, no rhonchi Abdominal: Soft, NT, ND, bowel sounds + Extremities: no edema, no cyanosis    The results of significant diagnostics from this hospitalization (including imaging, microbiology, ancillary and laboratory) are listed below for reference.     Microbiology: Recent Results (from the past 240 hour(s))  Urine culture     Status: None   Collection Time: 07/16/20 12:16 PM   Specimen: In/Out Cath Urine  Result Value Ref Range Status   Specimen Description IN/OUT CATH URINE  Final   Special Requests NONE  Final   Culture   Final    NO GROWTH Performed at [facility], [address] [place], [place] [address]    Report Status 07/17/2020 FINAL  Final  Respiratory Panel by RT PCR (Flu A&B, Covid) - Nasopharyngeal Swab     Status: None   Collection Time: 07/16/20 12:24 PM   Specimen: Nasopharyngeal Swab  Result Value Ref Range Status   SARS Coronavirus 2 by RT PCR NEGATIVE NEGATIVE Final    Comment: (NOTE) SARS-CoV-2 target nucleic acids are NOT DETECTED.  The SARS-CoV-2 RNA is generally detectable in upper respiratoy specimens during the acute phase of infection. The  lowest concentration of SARS-CoV-2 viral copies this assay can detect is 131 copies/mL. A negative result does not preclude SARS-Cov-2 infection and should not be used as the sole basis for treatment or other patient management decisions. A negative result may occur with  improper specimen collection/handling, submission of specimen other than nasopharyngeal swab, presence of viral mutation(s) within the areas targeted by this assay, and inadequate number of viral copies (<131 copies/mL). A negative result must be combined with clinical observations, patient history, and epidemiological information. The expected result is Negative.  Fact Sheet for Patients:   PinkCheek.be  Fact Sheet for Healthcare Providers:  GravelBags.it  This test is no t yet approved or cleared by the [place] FDA and  has been authorized for detection and/or diagnosis of SARS-CoV-2 by FDA under an Emergency Use Authorization (EUA). This EUA will remain  in effect (meaning this test can be used) for the duration of the COVID-19 declaration under Section 564(b)(1) of the Act, 21 U.S.C. section 360bbb-3(b)(1), unless the authorization is terminated or revoked sooner.     Influenza A by PCR NEGATIVE NEGATIVE Final   Influenza B by PCR NEGATIVE NEGATIVE Final    Comment: (NOTE) The Xpert Xpress SARS-CoV-2/FLU/RSV assay is intended as an aid in  the diagnosis of influenza from Nasopharyngeal swab specimens and  should not be used as a sole basis for treatment. Nasal washings and  aspirates are unacceptable for Xpert Xpress SARS-CoV-2/FLU/RSV  testing.  Fact Sheet for Patients: PinkCheek.be  Fact Sheet for Healthcare Providers: GravelBags.it  This test is not yet approved or cleared by the [place] FDA and  has been authorized for detection and/or diagnosis of SARS-CoV-2 by  FDA under an Emergency Use Authorization (EUA). This EUA will remain  in effect (meaning this test can be used) for the duration of the  Covid-19 declaration under Section 564(b)(1) of the Act, 21  U.S.C. section 360bbb-3(b)(1), unless the authorization is  terminated or revoked. Performed at [facility], [place] [address] [place], [place] [address]   Blood culture (routine single)     Status: None (Preliminary result)   Collection Time: 07/16/20 12:25 PM   Specimen: BLOOD LEFT FOREARM  Result Value Ref Range Status   Specimen Description BLOOD LEFT FOREARM  Final   Special Requests   Final    BOTTLES DRAWN AEROBIC AND ANAEROBIC Blood Culture results may not be  optimal due to an inadequate volume of blood received in culture bottles   Culture   Final    NO GROWTH 3 DAYS Performed at [facility], [place] [address], [place], [place] [address]    Report Status PENDING  Incomplete  Culture, blood (routine x 2)     Status: None (Preliminary result)   Collection Time: 07/17/20  7:30 PM   Specimen: BLOOD LEFT HAND  Result Value Ref Range Status   Specimen Description BLOOD LEFT HAND  Final   Special Requests   Final    BOTTLES DRAWN AEROBIC AND ANAEROBIC Blood Culture adequate volume   Culture   Final    NO GROWTH 2 DAYS Performed at [facility], [place] [address] [place], [place] [address]    Report Status PENDING  Incomplete  Culture, blood (routine x 2)     Status: None (Preliminary result)   Collection Time: 07/17/20  7:30 PM   Specimen: BLOOD RIGHT HAND  Result Value Ref Range Status   Specimen Description BLOOD RIGHT HAND  Final  Special Requests   Final    BOTTLES DRAWN AEROBIC ONLY Blood Culture adequate volume   Culture   Final    NO GROWTH 2 DAYS Performed at [facility], [place] [address] [place], [place] [address]    Report Status PENDING  Incomplete     Labs: BNP (last 3 results) No results for input(s): BNP in the last 8760 hours. Basic Metabolic Panel: Recent Labs  Lab 07/16/20 1240 07/16/20 1638 07/17/20 0418 07/18/20 0350  NA 140  --  141 140  K 3.5  --  3.1* 3.4*  CL 107  --  111 110  CO2 24  --  23 23  GLUCOSE 114*  --  109* 103*  BUN 16  --  14 13  CREATININE 1.48* 1.36* 1.37* 1.13  CALCIUM 8.6*  --  8.2* 8.3*  MG  --  1.9  --   --    Liver Function Tests: Recent Labs  Lab 07/16/20 1240 07/17/20 0418  AST 25 22  ALT 21 20  ALKPHOS 39 46  BILITOT 0.9 0.3  PROT 5.9* 5.6*  ALBUMIN 3.4* 3.0*   Recent Labs  Lab 07/16/20 1240  LIPASE 42   No results for input(s): AMMONIA in the last 168 hours. CBC: Recent Labs  Lab 07/16/20 1240 07/16/20 1703 07/17/20 0418 07/18/20 0350  07/19/20 1132  WBC 5.1 7.4 4.8 7.3 4.1  NEUTROABS 2.6  --   --  5.4 2.1  HGB 11.8* 12.8* 12.0* 11.8* 12.2*  HCT 36.8* 39.5 37.0* 36.2* 37.8*  MCV 85.6 86.8 86.2 86.4 84.9  PLT 144* 158 126* 122* 124*   Cardiac Enzymes: No results for input(s): CKTOTAL, CKMB, CKMBINDEX, TROPONINI in the last 168 hours. BNP: Invalid input(s): POCBNP CBG: Recent Labs  Lab 07/19/20 0612 07/19/20 1115 07/19/20 1639 07/19/20 2138 07/20/20 0610  GLUCAP 68* 108* 81 106* 154*   D-Dimer No results for input(s): DDIMER in the last 72 hours. Hgb A1c No results for input(s): HGBA1C in the last 72 hours. Lipid Profile No results for input(s): CHOL, HDL, LDLCALC, TRIG, CHOLHDL, LDLDIRECT in the last 72 hours. Thyroid function studies No results for input(s): TSH, T4TOTAL, T3FREE, THYROIDAB in the last 72 hours.  Invalid input(s): FREET3 Anemia work up No results for input(s): VITAMINB12, FOLATE, FERRITIN, TIBC, IRON, RETICCTPCT in the last 72 hours. Urinalysis    Component Value Date/Time   COLORURINE YELLOW 07/16/2020 1431   APPEARANCEUR CLEAR 07/16/2020 1431   LABSPEC 1.013 07/16/2020 1431   PHURINE 7.0 07/16/2020 1431   GLUCOSEU NEGATIVE 07/16/2020 1431   GLUCOSEU NEGATIVE 05/27/2014 1659   HGBUR NEGATIVE 07/16/2020 1431   BILIRUBINUR NEGATIVE 07/16/2020 1431   KETONESUR NEGATIVE 07/16/2020 1431   PROTEINUR NEGATIVE 07/16/2020 1431   UROBILINOGEN 0.2 05/27/2014 1659   NITRITE NEGATIVE 07/16/2020 1431   LEUKOCYTESUR NEGATIVE 07/16/2020 1431   Sepsis Labs Invalid input(s): PROCALCITONIN,  WBC,  LACTICIDVEN Microbiology Recent Results (from the past 240 hour(s))  Urine culture     Status: None   Collection Time: 07/16/20 12:16 PM   Specimen: In/Out Cath Urine  Result Value Ref Range Status   Specimen Description IN/OUT CATH URINE  Final   Special Requests NONE  Final   Culture   Final    NO GROWTH Performed at [facility], [place] [address], [place], [place] [address]    Report  Status 07/17/2020 FINAL  Final  Respiratory Panel by RT PCR (Flu A&B, Covid) - Nasopharyngeal Swab  Status: None   Collection Time: 07/16/20 12:24 PM   Specimen: Nasopharyngeal Swab  Result Value Ref Range Status   SARS Coronavirus 2 by RT PCR NEGATIVE NEGATIVE Final    Comment: (NOTE) SARS-CoV-2 target nucleic acids are NOT DETECTED.  The SARS-CoV-2 RNA is generally detectable in upper respiratoy specimens during the acute phase of infection. The lowest concentration of SARS-CoV-2 viral copies this assay can detect is 131 copies/mL. A negative result does not preclude SARS-Cov-2 infection and should not be used as the sole basis for treatment or other patient management decisions. A negative result may occur with  improper specimen collection/handling, submission of specimen other than nasopharyngeal swab, presence of viral mutation(s) within the areas targeted by this assay, and inadequate number of viral copies (<131 copies/mL). A negative result must be combined with clinical observations, patient history, and epidemiological information. The expected result is Negative.  Fact Sheet for Patients:  PinkCheek.be  Fact Sheet for Healthcare Providers:  GravelBags.it  This test is no t yet approved or cleared by the [place] FDA and  has been authorized for detection and/or diagnosis of SARS-CoV-2 by FDA under an Emergency Use Authorization (EUA). This EUA will remain  in effect (meaning this test can be used) for the duration of the COVID-19 declaration under Section 564(b)(1) of the Act, 21 U.S.C. section 360bbb-3(b)(1), unless the authorization is terminated or revoked sooner.     Influenza A by PCR NEGATIVE NEGATIVE Final   Influenza B by PCR NEGATIVE NEGATIVE Final    Comment: (NOTE) The Xpert Xpress SARS-CoV-2/FLU/RSV assay is intended as an aid in  the diagnosis of influenza from Nasopharyngeal swab  specimens and  should not be used as a sole basis for treatment. Nasal washings and  aspirates are unacceptable for Xpert Xpress SARS-CoV-2/FLU/RSV  testing.  Fact Sheet for Patients: PinkCheek.be  Fact Sheet for Healthcare Providers: GravelBags.it  This test is not yet approved or cleared by the [place] FDA and  has been authorized for detection and/or diagnosis of SARS-CoV-2 by  FDA under an Emergency Use Authorization (EUA). This EUA will remain  in effect (meaning this test can be used) for the duration of the  Covid-19 declaration under Section 564(b)(1) of the Act, 21  U.S.C. section 360bbb-3(b)(1), unless the authorization is  terminated or revoked. Performed at [facility], [place] [address], [place], [place] [address]   Blood culture (routine single)     Status: None (Preliminary result)   Collection Time: 07/16/20 12:25 PM   Specimen: BLOOD LEFT FOREARM  Result Value Ref Range Status   Specimen Description BLOOD LEFT FOREARM  Final   Special Requests   Final    BOTTLES DRAWN AEROBIC AND ANAEROBIC Blood Culture results may not be optimal due to an inadequate volume of blood received in culture bottles   Culture   Final    NO GROWTH 3 DAYS Performed at [facility], [place] [address], [place], [place] [address]    Report Status PENDING  Incomplete  Culture, blood (routine x 2)     Status: None (Preliminary result)   Collection Time: 07/17/20  7:30 PM   Specimen: BLOOD LEFT HAND  Result Value Ref Range Status   Specimen Description BLOOD LEFT HAND  Final   Special Requests   Final    BOTTLES DRAWN AEROBIC AND ANAEROBIC Blood Culture adequate volume   Culture   Final    NO GROWTH 2 DAYS Performed at [facility]  Lab, [address] [place], [place] [address]    Report Status PENDING  Incomplete  Culture, blood (routine x 2)     Status: None (Preliminary result)   Collection Time: 07/17/20   7:30 PM   Specimen: BLOOD RIGHT HAND  Result Value Ref Range Status   Specimen Description BLOOD RIGHT HAND  Final   Special Requests   Final    BOTTLES DRAWN AEROBIC ONLY Blood Culture adequate volume   Culture   Final    NO GROWTH 2 DAYS Performed at [facility], [place] [address] [place], [place] [address]    Report Status PENDING  Incomplete     Time coordinating discharge: Over 30 minutes  SIGNED:   Charlynne Cousins, MD  [facility] 07/20/2020, 8:27 AM Pager   If 7PM-7AM, please contact night-coverage [email or web address] Password [email or web address]

## 2020-07-20 NOTE — Progress Notes (Signed)
D/C instructions given and reviewed. Questions answered and encouraged to call with and further concerns. Tele and IV's removed, tolerated well.

## 2020-07-21 LAB — CULTURE, BLOOD (SINGLE): Culture: NO GROWTH

## 2020-07-22 LAB — CULTURE, BLOOD (ROUTINE X 2)
Culture: NO GROWTH
Culture: NO GROWTH
Special Requests: ADEQUATE
Special Requests: ADEQUATE

## 2020-08-03 ENCOUNTER — Telehealth: Payer: Self-pay

## 2020-08-03 ENCOUNTER — Telehealth: Payer: Self-pay | Admitting: Cardiology

## 2020-08-03 NOTE — Telephone Encounter (Signed)
Spoke to patient appointment scheduled with Dr.Jordan 08/04/20 at 4:20 pm.

## 2020-08-03 NOTE — Telephone Encounter (Signed)
Will add to schedule tomorrow  Gweneth Fredlund Martinique MD, [facility]

## 2020-08-03 NOTE — Telephone Encounter (Signed)
Pt c/o of Chest Pain: STAT if CP now or developed within 24 hours  1. Are you having CP right now? Yes, slight chest pain level 5 on pain   2. Are you experiencing any other symptoms (ex. SOB, nausea, vomiting, sweating)? Not now, but does experience SOB when walking   3. How long have you been experiencing CP? Ever since he left the hospital 07/20/20  4. Is your CP continuous or coming and going? Continuous, but lessens when sitting   5. Have you taken Nitroglycerin? No   BP 143/73 08/03/20 10:30 AM

## 2020-08-03 NOTE — Telephone Encounter (Signed)
Pt with recent hospital admission for chest pain.  Per Pt since he has been discharged he has continued to have chest pain, worse with walking.  Eases off when he is sitting.    Also having SOB with walking.  Per review of consult note from hospital-stress test was ordered and follow up to be determined by results.  No cath planned based on stress test results.  Note states to have Pt follow up with Dr. Martinique in November, but current appointment is for December 3.  Will reach out to Dr. Martinique to see if Pt can be worked in sooner.

## 2020-08-04 ENCOUNTER — Ambulatory Visit: Payer: Medicare HMO | Admitting: Cardiology

## 2020-08-04 ENCOUNTER — Other Ambulatory Visit: Payer: Self-pay

## 2020-08-04 ENCOUNTER — Encounter: Payer: Self-pay | Admitting: Cardiology

## 2020-08-04 ENCOUNTER — Other Ambulatory Visit: Payer: Self-pay | Admitting: Cardiology

## 2020-08-04 VITALS — Ht 67.5 in | Wt 175.2 lb

## 2020-08-04 DIAGNOSIS — I10 Essential (primary) hypertension: Secondary | ICD-10-CM

## 2020-08-04 DIAGNOSIS — I2510 Atherosclerotic heart disease of native coronary artery without angina pectoris: Secondary | ICD-10-CM | POA: Diagnosis not present

## 2020-08-04 DIAGNOSIS — Z01812 Encounter for preprocedural laboratory examination: Secondary | ICD-10-CM | POA: Diagnosis not present

## 2020-08-04 DIAGNOSIS — I25700 Atherosclerosis of coronary artery bypass graft(s), unspecified, with unstable angina pectoris: Secondary | ICD-10-CM

## 2020-08-04 DIAGNOSIS — E119 Type 2 diabetes mellitus without complications: Secondary | ICD-10-CM

## 2020-08-04 DIAGNOSIS — I200 Unstable angina: Secondary | ICD-10-CM

## 2020-08-04 DIAGNOSIS — Z794 Long term (current) use of insulin: Secondary | ICD-10-CM

## 2020-08-04 DIAGNOSIS — E1169 Type 2 diabetes mellitus with other specified complication: Secondary | ICD-10-CM | POA: Diagnosis not present

## 2020-08-04 DIAGNOSIS — E785 Hyperlipidemia, unspecified: Secondary | ICD-10-CM

## 2020-08-04 MED ORDER — PRASUGREL HCL 10 MG PO TABS
10.0000 mg | ORAL_TABLET | Freq: Every day | ORAL | 3 refills | Status: DC
Start: 2020-08-04 — End: 2021-04-30

## 2020-08-04 MED ORDER — SODIUM CHLORIDE 0.9% FLUSH: 3.0000 mL | Freq: Two times a day (BID) | INTRAVENOUS | Status: DC

## 2020-08-04 MED ORDER — METOPROLOL SUCCINATE ER 25 MG PO TB24
25.0000 mg | ORAL_TABLET | Freq: Every day | ORAL | 3 refills | Status: DC
Start: 2020-08-04 — End: 2020-08-08

## 2020-08-04 NOTE — Patient Instructions (Signed)
Medication Instructions:  Continue same medications *If you need a refill on your cardiac medications before your next appointment, please call your pharmacy*   Lab Work: Bmet,cbc,pt today   Testing/Procedures: Cardiac Cath    Follow instructions below   Follow-Up: At [facility], you and your health needs are our priority.  As part of our continuing mission to provide you with exceptional heart care, we have created designated Provider Care Teams.  These Care Teams include your primary Cardiologist (physician) and Advanced Practice Providers (APPs -  Physician Assistants and Nurse Practitioners) who all work together to provide you with the care you need, when you need it.  We recommend signing up for the patient portal called "MyChart".  Sign up information is provided on this After Visit Summary.  MyChart is used to connect with patients for Virtual Visits (Telemedicine).  Patients are able to view lab/test results, encounter notes, upcoming appointments, etc.  Non-urgent messages can be sent to your provider as well.   To learn more about what you can do with MyChart, go to [email or web address].    Your next appointment:  Wednesday 09/06/20 at 8:40 am   The format for your next appointment:  Office     Provider: [place] [facility] [address] Dept: [phone] Loc: [place]  08/04/2020  You are scheduled for a Cardiac Cath on Tuesday 08/08/20, with Dr.Smith.  1. Please arrive at the [facility] (Main Entrance A) at [facility]: [address] [place], [place] [address] at 9:00 am (This time is two hours before your procedure to ensure your preparation). Free valet parking service is available.   Special note: Every effort is made to have your procedure done on time. Please understand that emergencies sometimes  delay scheduled procedures.  2. Diet: Do not eat solid foods after midnight.  The patient may have clear liquids until 5am upon the day of the procedure.  3. Labs: You will need to have blood drawn on Friday 08/04/20 You do not need to be fasting.  Covid Test  Saturday 08/05/20 at 11:10 am at [address] Thru Site  You will need to [place] until after cath.  4. Medication instructions in preparation for your procedure:    Hold Insulin Morning Of Cath    Hold Sitagliptin/Metformin morning of Cath and 2 days after Cath     Hold Losartan/HCTZ morning of Cath     On the morning of your procedure, take your Effient and Aspirin 81 mg and any morning medicines NOT listed above.  You may use sips of water.  5. Plan for one night stay--bring personal belongings. 6. Bring a current list of your medications and current insurance cards. 7. You MUST have a responsible person to drive you home. 8. Someone MUST be with you the first 24 hours after you arrive home or your discharge will be delayed. 9. Please wear clothes that are easy to get on and off and wear slip-on shoes.  Thank you for allowing [place] to care for you!   -- [facility]

## 2020-08-04 NOTE — H&P (View-Only) (Signed)
Cardiology Office Note   Date:  08/04/2020   ID:  Antonio [name], DOB 1946-04-22, MRN [id number]  PCP:  [name], MD  Cardiologist:  Dr.Shaquil Aldana  CC: CAD   History of Present Illness: Antonio [name] is a 74 y.o. male who is seen for post hospital follow up.     He has a history of severe three-vessel native coronary artery disease status post CABG in 1996 (LIMA to LAD, SVG to diagonal, sequential SVG to OM1 and OM2, SVG to PDA). He had POBA of the SVG to the RCA in 1997. In 2012 he had a small NSTEMI and was found to have a nonobstructive ruptured plaque in the distal SVG to RCA - treated medically. He has had a number of cardiac caths done over the years for chest pain with patent grafts. He presented in November 2020 with unstable angina. Cardiac catheterization on 08/26/2019 revealing severe ostial SVG to diagonal stenosis and critical proximal SVG to PDA stenosis. He required PCI and stenting of the SVG to diagonal and SVG to PDA.  He has been on DAPT with ASA and Prasugrel.   He was recently admitted in early October with chest pain, hypotension and near syncope. He ruled out for MI by Ecg and troponin levels. He was hydrated and treated for possible PNA. He had an Echo that showed normal LV function. Mild MR and moderate TR. He had a nuclear stress test showing a predominantly fixed inferior wall defect with normal EF.   Since DC patient states he has continued to have left pectoral chest pain. He states he cannot walk 100 feet without getting chest pain and it is relieved with rest. He increased his Imdur to 120 mg without improvement. Pain is not radiating. Sometimes has SOB. No cough. Thinks symptoms similar to when he had stents last year. He feels something is not right.    Past Medical History:  Diagnosis Date  . Alcohol abuse, in remission   . Arthritis   . Bleeding per rectum    "related to prostate cancer and hemorrhoids" (09/03/2012)  . BPH (benign prostatic  hyperplasia)   . CAD (coronary artery disease)    a. s/p CABG 1996  b. LHC in 11/2013 w/ patent grafts. c. 12/19/14 re-look cath with patent grafts and good LVF  . Erectile dysfunction   . GERD (gastroesophageal reflux disease)   . History of lower GI bleeding   . HLD (hyperlipidemia)   . HTN (hypertension)   . Prostate cancer ([place])    a. s/p radiation   . S/P angioplasty with stent-DES ostial VG to PDA and DES ostial VG to diag. 08/27/19 08/28/2019  . Type II diabetes mellitus ([place])     Past Surgical History:  Procedure Laterality Date  . CARDIAC CATHETERIZATION  01/27/2009   ef 60%  . CARDIAC CATHETERIZATION  08/25/2012   Severe 3v obstructive CAD, continued graft patency (SVG-D,  SVG-OM1-OM2, SVG-PDA, LIMA-LAD). area of diffuse dz in the distal LCx (up to 90%) unamenable to PCI  . CERVICAL DISC SURGERY  ? 1990's   "went in on the side" (09/03/2012)  . CORONARY ARTERY BYPASS GRAFT  1996   LIMA GRAFT TO THE LAD, SAPHENOUS VEIN GRAFT SEQUENTIALLY TO THE FIRST AND SECOND OBTUSE MARGINAL VESSELS, SAPHENOUS VEIN GRAFT TO THE DIAGONAL, AND SAPHENOUS VEIN GRAFT TO THE DISTAL RIGHT CORONARY   . CORONARY STENT INTERVENTION N/A 08/27/2019   Procedure: CORONARY STENT INTERVENTION;  Surgeon: Martinique, Dorell Gatlin M, MD;  Location: [facility]  INVASIVE CV LAB;  Service: Cardiovascular;  Laterality: N/A;  . ESOPHAGOGASTRODUODENOSCOPY Left 11/19/2013   Procedure: ESOPHAGOGASTRODUODENOSCOPY (EGD);  Surgeon: Arta Silence, MD;  Location: [facility] ENDOSCOPY;  Service: Endoscopy;  Laterality: Left;  . FOOT SURGERY  1980's   LEFT, "shot a nail gun thru it" (09/03/2012)  . LACERATION REPAIR  1980's   LEFT HAND  . LEFT HEART CATH AND CORS/GRAFTS ANGIOGRAPHY N/A 08/26/2019   Procedure: LEFT HEART CATH AND CORS/GRAFTS ANGIOGRAPHY;  Surgeon: Martinique, Javiel Canepa M, MD;  Location: [facility] CV LAB;  Service: Cardiovascular;  Laterality: N/A;  . LEFT HEART CATHETERIZATION WITH CORONARY ANGIOGRAM N/A 09/04/2012   Procedure: LEFT HEART  CATHETERIZATION WITH CORONARY ANGIOGRAM;  Surgeon: Sammy Douthitt M Martinique, MD;  Location: [facility] CATH LAB;  Service: Cardiovascular;  Laterality: N/A;  . LEFT HEART CATHETERIZATION WITH CORONARY/GRAFT ANGIOGRAM N/A 11/22/2013   Procedure: LEFT HEART CATHETERIZATION WITH Beatrix Fetters;  Surgeon: Jettie Booze, MD;  Location: [facility] CATH LAB;  Service: Cardiovascular;  Laterality: N/A;  . LEFT HEART CATHETERIZATION WITH CORONARY/GRAFT ANGIOGRAM N/A 12/19/2014   Procedure: LEFT HEART CATHETERIZATION WITH Beatrix Fetters;  Surgeon: Adalind Weitz M Martinique, MD;  Location: [facility] CATH LAB;  Service: Cardiovascular;  Laterality: N/A;     Current Outpatient Medications  Medication Sig Dispense Refill  . aspirin EC 81 MG tablet Take 81 mg by mouth daily.    Marland Kitchen azithromycin (ZITHROMAX) 500 MG tablet Take 1 tablet (500 mg total) by mouth daily. 3 tablet 0  . Insulin Pen Needle (BD PEN NEEDLE NANO U/F) 32G X 4 MM MISC 1 pen by Does not apply route daily. 30 each 11  . isosorbide mononitrate (IMDUR) 60 MG 24 hr tablet Take 1.5 tablets (90 mg total) by mouth daily. 135 tablet 3  . LEVEMIR FLEXTOUCH 100 UNIT/ML Pen Inject 45 Units into the skin daily.   0  . metoprolol succinate (TOPROL-XL) 25 MG 24 hr tablet Take 25 mg by mouth daily.    . Multiple Vitamin (MULTIVITAMIN WITH MINERALS) TABS Take 1 tablet by mouth daily.    . nitroGLYCERIN (NITROSTAT) 0.4 MG SL tablet Place 0.4 mg under the tongue every 5 (five) minutes as needed for chest pain.    . pantoprazole (PROTONIX) 40 MG tablet Take 1 tablet (40 mg total) by mouth 2 (two) times daily. (Patient taking differently: Take 40 mg by mouth daily as needed. ) 60 tablet 11  . Pramoxine-Camphor-Zinc Acetate (ANTI ITCH EX) Apply 1 application topically daily as needed (itching).     . prasugrel (EFFIENT) 10 MG TABS tablet Take 1 tablet (10 mg total) by mouth daily. 90 tablet 3  . rosuvastatin (CRESTOR) 40 MG tablet Take 1 tablet (40 mg total) by mouth daily. 90 tablet 3    . SitaGLIPtin-MetFORMIN HCl [phone] MG TB24 Take 1 tablet by mouth daily.    . traMADol (ULTRAM) 50 MG tablet Take 1 tablet (50 mg total) by mouth 3 (three) times daily as needed for moderate pain. 90 tablet 1  . losartan-hydrochlorothiazide (HYZAAR) 100-25 MG tablet Take 1 tablet by mouth daily. (Patient not taking: Reported on 08/04/2020) 90 tablet 3   No current facility-administered medications for this visit.    Allergies:   Demerol    Social History:  The patient  reports that he quit smoking about 17 years ago. His smoking use included cigarettes. He has a 37.00 pack-year smoking history. He has never used smokeless tobacco. He reports current alcohol use. He reports that he does not use drugs.  Family History:  The patient's family history includes CAD in his brother; Cancer in his mother and other family members; Diabetes in an other family member; Hypertension in his father, mother, and another family member.    ROS: All other systems are reviewed and negative. Unless otherwise mentioned in H&P    PHYSICAL EXAM: VS:  Ht 5' 7.5" (1.715 m)   Wt 175 lb 3.2 oz (79.5 kg)   SpO2 100%   BMI 27.04 kg/m  , BMI Body mass index is 27.04 kg/m. GEN: Well nourished, mildly overweight, in no acute distress HEENT: normal Neck: no JVD, carotid bruits, or masses Cardiac: RRR; no murmurs, rubs, or gallops,no edema  Respiratory:  Clear to auscultation bilaterally, normal work of breathing GI: soft, nontender, nondistended, + BS MS: no deformity or atrophy Skin: warm and dry, no rash Neuro:  Strength and sensation are intact Psych: euthymic mood, full affect    Recent Labs: 07/16/2020: Magnesium 1.9; TSH 0.457 07/17/2020: ALT 20 07/18/2020: BUN 13; Creatinine, Ser 1.13; Potassium 3.4; Sodium 140 07/19/2020: Hemoglobin 12.2; Platelets 124    Lipid Panel    Component Value Date/Time   CHOL 124 08/17/2019 1109   TRIG 76 08/17/2019 1109   HDL 41 08/17/2019 1109   CHOLHDL 3.0  08/17/2019 1109   CHOLHDL 5.0 05/08/2017 0214   VLDL 29 05/08/2017 0214   LDLCALC 68 08/17/2019 1109      Wt Readings from Last 3 Encounters:  08/04/20 175 lb 3.2 oz (79.5 kg)  07/20/20 177 lb 3.2 oz (80.4 kg)  02/21/20 181 lb (82.1 kg)      Other studies Reviewed: Cardiac Cath 08/26/19  Mid LAD lesion is 100% stenosed.  1st Sept lesion is 90% stenosed.  1st Mrg lesion is 100% stenosed.  2nd Mrg lesion is 100% stenosed.  Mid Cx to Dist Cx lesion is 100% stenosed.  Mid RCA to Dist RCA lesion is 100% stenosed.  Prox RCA lesion is 100% stenosed.  Seq SVG- OM1 and OM2 graft was visualized by angiography and is normal in caliber.  The graft exhibits no disease.  SVG graft was visualized by angiography.  Origin lesion is 90% stenosed.  SVG graft was visualized by angiography.  Origin lesion is 99% stenosed.  Mid Graft lesion is 40% stenosed.  LIMA graft was visualized by non-selective angiography and is normal in caliber.  The left ventricular systolic function is normal.  LV end diastolic pressure is normal.  The left ventricular ejection fraction is 55-65% by visual estimate.  1. Severe 3 vessel occlusive CAD 2. Patent LIMA to the LAD 3. Patent SVG sequential to the OM1 and OM2 4. SVG to the diagonal with severe ostial SVG stenosis 5. SVG to the PDA with critical proximal SVG stenosis 6. Normal overall LV function 7. Normal LVEDP  Plan: recommend PCI/stenting of the SVG to diagonal and SVG to PDA. Given complexity of disease and CKD recommend admit for observation with overnight hydration and staged PCI tomorrow.   PCI 08/27/19  SVG to Diagonal  Origin lesion is 90% stenosed.  A drug-eluting stent was successfully placed using a STENT SYNERGY DES 3X16.  Post intervention, there is a 0% residual stenosis.  SVG to PDA  Origin lesion is 99% stenosed.  A drug-eluting stent was successfully placed using a STENT SYNERGY DES 3X16.  Post  intervention, there is a 0% residual stenosis.  1. Successful PCI of the ostial SVG to PDA with DES x 1 2. Successful PCI of the ostial  SVG to diagonal with DES x 1.  Plan: DAPT for one year. Anticipate DC in am.   Myoview 07/18/20: There was no ST segment deviation noted during stress.  No T wave inversion was noted during stress.  Defect 1: There is a medium defect of severe severity present in the basal inferior and mid inferior location.  This is a low risk study.  The left ventricular ejection fraction is normal (55-65%).   Abnormal, low risk stress nuclear study with prior inferior infarct and mild peri-infarct ischemia.  Gated ejection fraction 58% with hypokinesis of the inferior wall.  Echo 07/17/20: 1. Apex not well visualized but may be hypokinetic Basal inferior wall hypokinesis . Left ventricular ejection fraction, by estimation, is 55%. The left ventricle has normal function. The left ventricle has no regional wall motion abnormalities. Left ventricular diastolic parameters were normal. 2. Right ventricular systolic function is normal. The right ventricular size is normal. There is normal pulmonary artery systolic pressure. 3. Left atrial size was mildly dilated. 4. The mitral valve is normal in structure. Mild mitral valve regurgitation. No evidence of mitral stenosis. 5. Tricuspid valve regurgitation is moderate. 6. The aortic valve is normal in structure. Aortic valve regurgitation is trivial. No aortic stenosis is present. 7. The inferior vena cava is normal in size with greater than 50% respiratory variability, suggesting right atrial pressure of 3 mmHg.  ASSESSMENT AND PLAN:  1.  Coronary artery disease: History of coronary artery bypass grafting in 1996. S/p POBA of SVG to RCA 1997.   More recent PCI with stenting to the ostial SVG to diagonal and SVG to PDA in November 2020. Recently admitted with chest pain and ruled out for MI. Echo and Myoview were  reassuring but he continues to have significant exertional chest pain class 3 unresponsive to medical therapy. I have recommended repeat cardiac cath with possible PCI. The procedure and risks were reviewed including but not limited to death, myocardial infarction, stroke, arrythmias, bleeding, transfusion, emergency surgery, dye allergy, or renal dysfunction. The patient voices understanding and is agreeable to proceed.   2.  Hypertension: Blood pressure is well  controlled.    3.  Hyperlipidemia: controlled on Crestor and at goal.   4. DM per primary care       Antonio [name] Martinique MD, [facility]     08/04/2020 4:31 PM    [address] [place] [address] Office [phone] Fax [phone]

## 2020-08-04 NOTE — Progress Notes (Signed)
Cardiology Office Note   Date:  08/04/2020   ID:  Antonio [name], DOB 18-Apr-1946, MRN [id number]  PCP:  [name], MD  Cardiologist:  Dr.Pablo Stauffer  CC: CAD   History of Present Illness: Antonio [name] is a 74 y.o. male who is seen for post hospital follow up.     He has a history of severe three-vessel native coronary artery disease status post CABG in 1996 (LIMA to LAD, SVG to diagonal, sequential SVG to OM1 and OM2, SVG to PDA). He had POBA of the SVG to the RCA in 1997. In 2012 he had a small NSTEMI and was found to have a nonobstructive ruptured plaque in the distal SVG to RCA - treated medically. He has had a number of cardiac caths done over the years for chest pain with patent grafts. He presented in November 2020 with unstable angina. Cardiac catheterization on 08/26/2019 revealing severe ostial SVG to diagonal stenosis and critical proximal SVG to PDA stenosis. He required PCI and stenting of the SVG to diagonal and SVG to PDA.  He has been on DAPT with ASA and Prasugrel.   He was recently admitted in early October with chest pain, hypotension and near syncope. He ruled out for MI by Ecg and troponin levels. He was hydrated and treated for possible PNA. He had an Echo that showed normal LV function. Mild MR and moderate TR. He had a nuclear stress test showing a predominantly fixed inferior wall defect with normal EF.   Since DC patient states he has continued to have left pectoral chest pain. He states he cannot walk 100 feet without getting chest pain and it is relieved with rest. He increased his Imdur to 120 mg without improvement. Pain is not radiating. Sometimes has SOB. No cough. Thinks symptoms similar to when he had stents last year. He feels something is not right.    Past Medical History:  Diagnosis Date  . Alcohol abuse, in remission   . Arthritis   . Bleeding per rectum    "related to prostate cancer and hemorrhoids" (09/03/2012)  . BPH (benign prostatic  hyperplasia)   . CAD (coronary artery disease)    a. s/p CABG 1996  b. LHC in 11/2013 w/ patent grafts. c. 12/19/14 re-look cath with patent grafts and good LVF  . Erectile dysfunction   . GERD (gastroesophageal reflux disease)   . History of lower GI bleeding   . HLD (hyperlipidemia)   . HTN (hypertension)   . Prostate cancer ([place])    a. s/p radiation   . S/P angioplasty with stent-DES ostial VG to PDA and DES ostial VG to diag. 08/27/19 08/28/2019  . Type II diabetes mellitus ([place])     Past Surgical History:  Procedure Laterality Date  . CARDIAC CATHETERIZATION  01/27/2009   ef 60%  . CARDIAC CATHETERIZATION  08/25/2012   Severe 3v obstructive CAD, continued graft patency (SVG-D,  SVG-OM1-OM2, SVG-PDA, LIMA-LAD). area of diffuse dz in the distal LCx (up to 90%) unamenable to PCI  . CERVICAL DISC SURGERY  ? 1990's   "went in on the side" (09/03/2012)  . CORONARY ARTERY BYPASS GRAFT  1996   LIMA GRAFT TO THE LAD, SAPHENOUS VEIN GRAFT SEQUENTIALLY TO THE FIRST AND SECOND OBTUSE MARGINAL VESSELS, SAPHENOUS VEIN GRAFT TO THE DIAGONAL, AND SAPHENOUS VEIN GRAFT TO THE DISTAL RIGHT CORONARY   . CORONARY STENT INTERVENTION N/A 08/27/2019   Procedure: CORONARY STENT INTERVENTION;  Surgeon: Martinique, Chukwudi Ewen M, MD;  Location: [facility]  INVASIVE CV LAB;  Service: Cardiovascular;  Laterality: N/A;  . ESOPHAGOGASTRODUODENOSCOPY Left 11/19/2013   Procedure: ESOPHAGOGASTRODUODENOSCOPY (EGD);  Surgeon: Arta Silence, MD;  Location: [facility] ENDOSCOPY;  Service: Endoscopy;  Laterality: Left;  . FOOT SURGERY  1980's   LEFT, "shot a nail gun thru it" (09/03/2012)  . LACERATION REPAIR  1980's   LEFT HAND  . LEFT HEART CATH AND CORS/GRAFTS ANGIOGRAPHY N/A 08/26/2019   Procedure: LEFT HEART CATH AND CORS/GRAFTS ANGIOGRAPHY;  Surgeon: Martinique, Khaleesi Gruel M, MD;  Location: [place] CV LAB;  Service: Cardiovascular;  Laterality: N/A;  . LEFT HEART CATHETERIZATION WITH CORONARY ANGIOGRAM N/A 09/04/2012   Procedure: LEFT HEART  CATHETERIZATION WITH CORONARY ANGIOGRAM;  Surgeon: Nikko Goldwire M Martinique, MD;  Location: [facility] CATH LAB;  Service: Cardiovascular;  Laterality: N/A;  . LEFT HEART CATHETERIZATION WITH CORONARY/GRAFT ANGIOGRAM N/A 11/22/2013   Procedure: LEFT HEART CATHETERIZATION WITH Beatrix Fetters;  Surgeon: Jettie Booze, MD;  Location: [facility] CATH LAB;  Service: Cardiovascular;  Laterality: N/A;  . LEFT HEART CATHETERIZATION WITH CORONARY/GRAFT ANGIOGRAM N/A 12/19/2014   Procedure: LEFT HEART CATHETERIZATION WITH Beatrix Fetters;  Surgeon: Doyne Micke M Martinique, MD;  Location: [facility] CATH LAB;  Service: Cardiovascular;  Laterality: N/A;     Current Outpatient Medications  Medication Sig Dispense Refill  . aspirin EC 81 MG tablet Take 81 mg by mouth daily.    Marland Kitchen azithromycin (ZITHROMAX) 500 MG tablet Take 1 tablet (500 mg total) by mouth daily. 3 tablet 0  . Insulin Pen Needle (BD PEN NEEDLE NANO U/F) 32G X 4 MM MISC 1 pen by Does not apply route daily. 30 each 11  . isosorbide mononitrate (IMDUR) 60 MG 24 hr tablet Take 1.5 tablets (90 mg total) by mouth daily. 135 tablet 3  . LEVEMIR FLEXTOUCH 100 UNIT/ML Pen Inject 45 Units into the skin daily.   0  . metoprolol succinate (TOPROL-XL) 25 MG 24 hr tablet Take 25 mg by mouth daily.    . Multiple Vitamin (MULTIVITAMIN WITH MINERALS) TABS Take 1 tablet by mouth daily.    . nitroGLYCERIN (NITROSTAT) 0.4 MG SL tablet Place 0.4 mg under the tongue every 5 (five) minutes as needed for chest pain.    . pantoprazole (PROTONIX) 40 MG tablet Take 1 tablet (40 mg total) by mouth 2 (two) times daily. (Patient taking differently: Take 40 mg by mouth daily as needed. ) 60 tablet 11  . Pramoxine-Camphor-Zinc Acetate (ANTI ITCH EX) Apply 1 application topically daily as needed (itching).     . prasugrel (EFFIENT) 10 MG TABS tablet Take 1 tablet (10 mg total) by mouth daily. 90 tablet 3  . rosuvastatin (CRESTOR) 40 MG tablet Take 1 tablet (40 mg total) by mouth daily. 90 tablet 3    . SitaGLIPtin-MetFORMIN HCl [phone] MG TB24 Take 1 tablet by mouth daily.    . traMADol (ULTRAM) 50 MG tablet Take 1 tablet (50 mg total) by mouth 3 (three) times daily as needed for moderate pain. 90 tablet 1  . losartan-hydrochlorothiazide (HYZAAR) 100-25 MG tablet Take 1 tablet by mouth daily. (Patient not taking: Reported on 08/04/2020) 90 tablet 3   No current facility-administered medications for this visit.    Allergies:   Demerol    Social History:  The patient  reports that he quit smoking about 17 years ago. His smoking use included cigarettes. He has a 37.00 pack-year smoking history. He has never used smokeless tobacco. He reports current alcohol use. He reports that he does not use drugs.  Family History:  The patient's family history includes CAD in his brother; Cancer in his mother and other family members; Diabetes in an other family member; Hypertension in his father, mother, and another family member.    ROS: All other systems are reviewed and negative. Unless otherwise mentioned in H&P    PHYSICAL EXAM: VS:  Ht 5' 7.5" (1.715 m)   Wt 175 lb 3.2 oz (79.5 kg)   SpO2 100%   BMI 27.04 kg/m  , BMI Body mass index is 27.04 kg/m. GEN: Well nourished, mildly overweight, in no acute distress HEENT: normal Neck: no JVD, carotid bruits, or masses Cardiac: RRR; no murmurs, rubs, or gallops,no edema  Respiratory:  Clear to auscultation bilaterally, normal work of breathing GI: soft, nontender, nondistended, + BS MS: no deformity or atrophy Skin: warm and dry, no rash Neuro:  Strength and sensation are intact Psych: euthymic mood, full affect    Recent Labs: 07/16/2020: Magnesium 1.9; TSH 0.457 07/17/2020: ALT 20 07/18/2020: BUN 13; Creatinine, Ser 1.13; Potassium 3.4; Sodium 140 07/19/2020: Hemoglobin 12.2; Platelets 124    Lipid Panel    Component Value Date/Time   CHOL 124 08/17/2019 1109   TRIG 76 08/17/2019 1109   HDL 41 08/17/2019 1109   CHOLHDL 3.0  08/17/2019 1109   CHOLHDL 5.0 05/08/2017 0214   VLDL 29 05/08/2017 0214   LDLCALC 68 08/17/2019 1109      Wt Readings from Last 3 Encounters:  08/04/20 175 lb 3.2 oz (79.5 kg)  07/20/20 177 lb 3.2 oz (80.4 kg)  02/21/20 181 lb (82.1 kg)      Other studies Reviewed: Cardiac Cath 08/26/19  Mid LAD lesion is 100% stenosed.  1st Sept lesion is 90% stenosed.  1st Mrg lesion is 100% stenosed.  2nd Mrg lesion is 100% stenosed.  Mid Cx to Dist Cx lesion is 100% stenosed.  Mid RCA to Dist RCA lesion is 100% stenosed.  Prox RCA lesion is 100% stenosed.  Seq SVG- OM1 and OM2 graft was visualized by angiography and is normal in caliber.  The graft exhibits no disease.  SVG graft was visualized by angiography.  Origin lesion is 90% stenosed.  SVG graft was visualized by angiography.  Origin lesion is 99% stenosed.  Mid Graft lesion is 40% stenosed.  LIMA graft was visualized by non-selective angiography and is normal in caliber.  The left ventricular systolic function is normal.  LV end diastolic pressure is normal.  The left ventricular ejection fraction is 55-65% by visual estimate.  1. Severe 3 vessel occlusive CAD 2. Patent LIMA to the LAD 3. Patent SVG sequential to the OM1 and OM2 4. SVG to the diagonal with severe ostial SVG stenosis 5. SVG to the PDA with critical proximal SVG stenosis 6. Normal overall LV function 7. Normal LVEDP  Plan: recommend PCI/stenting of the SVG to diagonal and SVG to PDA. Given complexity of disease and CKD recommend admit for observation with overnight hydration and staged PCI tomorrow.   PCI 08/27/19  SVG to Diagonal  Origin lesion is 90% stenosed.  A drug-eluting stent was successfully placed using a STENT SYNERGY DES 3X16.  Post intervention, there is a 0% residual stenosis.  SVG to PDA  Origin lesion is 99% stenosed.  A drug-eluting stent was successfully placed using a STENT SYNERGY DES 3X16.  Post  intervention, there is a 0% residual stenosis.  1. Successful PCI of the ostial SVG to PDA with DES x 1 2. Successful PCI of the ostial  SVG to diagonal with DES x 1.  Plan: DAPT for one year. Anticipate DC in am.   Myoview 07/18/20: There was no ST segment deviation noted during stress.  No T wave inversion was noted during stress.  Defect 1: There is a medium defect of severe severity present in the basal inferior and mid inferior location.  This is a low risk study.  The left ventricular ejection fraction is normal (55-65%).   Abnormal, low risk stress nuclear study with prior inferior infarct and mild peri-infarct ischemia.  Gated ejection fraction 58% with hypokinesis of the inferior wall.  Echo 07/17/20: 1. Apex not well visualized but may be hypokinetic Basal inferior wall hypokinesis . Left ventricular ejection fraction, by estimation, is 55%. The left ventricle has normal function. The left ventricle has no regional wall motion abnormalities. Left ventricular diastolic parameters were normal. 2. Right ventricular systolic function is normal. The right ventricular size is normal. There is normal pulmonary artery systolic pressure. 3. Left atrial size was mildly dilated. 4. The mitral valve is normal in structure. Mild mitral valve regurgitation. No evidence of mitral stenosis. 5. Tricuspid valve regurgitation is moderate. 6. The aortic valve is normal in structure. Aortic valve regurgitation is trivial. No aortic stenosis is present. 7. The inferior vena cava is normal in size with greater than 50% respiratory variability, suggesting right atrial pressure of 3 mmHg.  ASSESSMENT AND PLAN:  1.  Coronary artery disease: History of coronary artery bypass grafting in 1996. S/p POBA of SVG to RCA 1997.   More recent PCI with stenting to the ostial SVG to diagonal and SVG to PDA in November 2020. Recently admitted with chest pain and ruled out for MI. Echo and Myoview were  reassuring but he continues to have significant exertional chest pain class 3 unresponsive to medical therapy. I have recommended repeat cardiac cath with possible PCI. The procedure and risks were reviewed including but not limited to death, myocardial infarction, stroke, arrythmias, bleeding, transfusion, emergency surgery, dye allergy, or renal dysfunction. The patient voices understanding and is agreeable to proceed.   2.  Hypertension: Blood pressure is well  controlled.    3.  Hyperlipidemia: controlled on Crestor and at goal.   4. DM per primary care       Loralai Eisman Martinique MD, [facility]     08/04/2020 4:31 PM    [address] Office [phone] Fax [phone]

## 2020-08-05 ENCOUNTER — Other Ambulatory Visit (HOSPITAL_COMMUNITY)
Admission: RE | Admit: 2020-08-05 | Discharge: 2020-08-05 | Disposition: A | Discharge status: Home / Self Care | Payer: Medicare HMO | Source: Ambulatory Visit | Attending: Interventional Cardiology | Admitting: Interventional Cardiology

## 2020-08-05 DIAGNOSIS — Z20822 Contact with and (suspected) exposure to covid-19: Secondary | ICD-10-CM | POA: Diagnosis present

## 2020-08-05 LAB — CBC WITH DIFFERENTIAL/PLATELET
Basophils Absolute: 0 10*3/uL (ref 0.0–0.2)
Basos: 0 %
EOS (ABSOLUTE): 0.1 10*3/uL (ref 0.0–0.4)
Eos: 3 %
Hematocrit: 38 % (ref 37.5–51.0)
Hemoglobin: 12.8 g/dL — ABNORMAL LOW (ref 13.0–17.7)
Immature Grans (Abs): 0 10*3/uL (ref 0.0–0.1)
Immature Granulocytes: 0 %
Lymphocytes Absolute: 1.7 10*3/uL (ref 0.7–3.1)
Lymphs: 34 %
MCH: 28.2 pg (ref 26.6–33.0)
MCHC: 33.7 g/dL (ref 31.5–35.7)
MCV: 84 fL (ref 79–97)
Monocytes Absolute: 0.4 10*3/uL (ref 0.1–0.9)
Monocytes: 8 %
Neutrophils Absolute: 2.8 10*3/uL (ref 1.4–7.0)
Neutrophils: 55 %
Platelets: 177 10*3/uL (ref 150–450)
RBC: 4.54 x10E6/uL (ref 4.14–5.80)
RDW: 16.2 % — ABNORMAL HIGH (ref 11.6–15.4)
WBC: 5.1 10*3/uL (ref 3.4–10.8)

## 2020-08-05 LAB — BASIC METABOLIC PANEL
BUN/Creatinine Ratio: 12 (ref 10–24)
BUN: 15 mg/dL (ref 8–27)
CO2: 23 mmol/L (ref 20–29)
Calcium: 9 mg/dL (ref 8.6–10.2)
Chloride: 108 mmol/L — ABNORMAL HIGH (ref 96–106)
Creatinine, Ser: 1.28 mg/dL — ABNORMAL HIGH (ref 0.76–1.27)
GFR calc Af Amer: 65 mL/min/{1.73_m2} (ref >59)
GFR calc non Af Amer: 56 mL/min/{1.73_m2} — ABNORMAL LOW (ref >59)
Glucose: 62 mg/dL — ABNORMAL LOW (ref 65–99)
Potassium: 4.1 mmol/L (ref 3.5–5.2)
Sodium: 142 mmol/L (ref 134–144)

## 2020-08-05 LAB — PT AND PTT
INR: 1 (ref 0.9–1.2)
Prothrombin Time: 10.3 s (ref 9.1–12.0)
aPTT: 28 s (ref 24–33)

## 2020-08-05 LAB — SARS CORONAVIRUS 2 (TAT 6-24 HRS): SARS Coronavirus 2: NEGATIVE

## 2020-08-07 ENCOUNTER — Other Ambulatory Visit: Payer: Self-pay

## 2020-08-07 ENCOUNTER — Telehealth: Payer: Self-pay | Admitting: *Deleted

## 2020-08-07 NOTE — Telephone Encounter (Signed)
Pt contacted pre-catheterization scheduled at [facility] for: Tuesday August 08, 2020 11 AM Verified arrival time and place: [facility]) at: 9 AM   No solid food after midnight prior to cath, clear liquids until 5 AM day of procedure.  Hold: Losartan-HCTZ-AM of procedure Insulin-AM of procedure Sitagliptin-Metformin (Janumet)-day of procedure and 48 hours post procedure  Except hold medications AM meds can be  taken pre-cath with sips of water including: ASA 81 mg Effient 10 mg  Confirmed patient has responsible adult to drive home post procedure and be with patient first 24 hours after arriving home: yes  You are allowed ONE visitor in the waiting room during the time you are at the hospital for your procedure. Both you and your visitor must wear a mask once you enter the hospital.       COVID-19 Pre-Screening Questions:  . In the past 14 days have you had a new cough, new headache, new nasal congestion, fever (100.4 or greater) unexplained body aches, new sore throat, or sudden loss of taste or sense of smell? no . In the past 14 days have you been around anyone with known Covid 19? no . Have you been vaccinated for COVID-19? Yes, see immunization history  Reviewed procedure/mask/visitor instructions, COVID-19 questions with patient.

## 2020-08-08 ENCOUNTER — Encounter (HOSPITAL_COMMUNITY)
Admission: RE | Disposition: A | Discharge status: Home / Self Care | Payer: Self-pay | Source: Home / Self Care | Attending: Interventional Cardiology

## 2020-08-08 ENCOUNTER — Other Ambulatory Visit: Payer: Self-pay

## 2020-08-08 ENCOUNTER — Ambulatory Visit (HOSPITAL_COMMUNITY)
Admission: RE | Admit: 2020-08-08 | Discharge: 2020-08-08 | Disposition: A | Discharge status: Home / Self Care | Payer: Medicare HMO | Attending: Interventional Cardiology | Admitting: Interventional Cardiology

## 2020-08-08 DIAGNOSIS — K922 Gastrointestinal hemorrhage, unspecified: Secondary | ICD-10-CM | POA: Diagnosis present

## 2020-08-08 DIAGNOSIS — I25709 Atherosclerosis of coronary artery bypass graft(s), unspecified, with unspecified angina pectoris: Secondary | ICD-10-CM | POA: Insufficient documentation

## 2020-08-08 DIAGNOSIS — I10 Essential (primary) hypertension: Secondary | ICD-10-CM | POA: Diagnosis present

## 2020-08-08 DIAGNOSIS — I25708 Atherosclerosis of coronary artery bypass graft(s), unspecified, with other forms of angina pectoris: Secondary | ICD-10-CM | POA: Diagnosis not present

## 2020-08-08 DIAGNOSIS — I2510 Atherosclerotic heart disease of native coronary artery without angina pectoris: Secondary | ICD-10-CM | POA: Diagnosis present

## 2020-08-08 DIAGNOSIS — Z885 Allergy status to narcotic agent status: Secondary | ICD-10-CM | POA: Insufficient documentation

## 2020-08-08 DIAGNOSIS — Z951 Presence of aortocoronary bypass graft: Secondary | ICD-10-CM | POA: Diagnosis not present

## 2020-08-08 DIAGNOSIS — I2582 Chronic total occlusion of coronary artery: Secondary | ICD-10-CM | POA: Diagnosis not present

## 2020-08-08 DIAGNOSIS — I200 Unstable angina: Secondary | ICD-10-CM | POA: Diagnosis present

## 2020-08-08 DIAGNOSIS — E785 Hyperlipidemia, unspecified: Secondary | ICD-10-CM | POA: Insufficient documentation

## 2020-08-08 DIAGNOSIS — Z87891 Personal history of nicotine dependence: Secondary | ICD-10-CM | POA: Insufficient documentation

## 2020-08-08 DIAGNOSIS — I25118 Atherosclerotic heart disease of native coronary artery with other forms of angina pectoris: Secondary | ICD-10-CM | POA: Diagnosis not present

## 2020-08-08 DIAGNOSIS — E119 Type 2 diabetes mellitus without complications: Secondary | ICD-10-CM

## 2020-08-08 HISTORY — PX: LEFT HEART CATH AND CORS/GRAFTS ANGIOGRAPHY: CATH118250

## 2020-08-08 LAB — GLUCOSE, CAPILLARY
Glucose-Capillary: 95 mg/dL (ref 70–99)
Glucose-Capillary: 96 mg/dL (ref 70–99)

## 2020-08-08 SURGERY — LEFT HEART CATH AND CORS/GRAFTS ANGIOGRAPHY
Anesthesia: LOCAL

## 2020-08-08 MED ORDER — METOPROLOL SUCCINATE ER 25 MG PO TB24
50.0000 mg | ORAL_TABLET | Freq: Every day | ORAL | 3 refills | Status: DC
Start: 2020-08-08 — End: 2020-09-06

## 2020-08-08 MED ORDER — FENTANYL CITRATE (PF) 100 MCG/2ML IJ SOLN
INTRAMUSCULAR | Status: DC | PRN
  Administered 2020-08-08: 50 ug via INTRAVENOUS

## 2020-08-08 MED ORDER — HYDRALAZINE HCL 20 MG/ML IJ SOLN
INTRAMUSCULAR | Status: DC | PRN
  Administered 2020-08-08: 10 mg via INTRAVENOUS

## 2020-08-08 MED ORDER — FENTANYL CITRATE (PF) 100 MCG/2ML IJ SOLN
INTRAMUSCULAR | Status: AC
  Filled 2020-08-08: qty 2

## 2020-08-08 MED ORDER — SODIUM CHLORIDE 0.9 % IV SOLN: 250.0000 mL | INTRAVENOUS | Status: DC | PRN

## 2020-08-08 MED ORDER — SODIUM CHLORIDE 0.9% FLUSH: 3.0000 mL | INTRAVENOUS | Status: DC | PRN

## 2020-08-08 MED ORDER — MIDAZOLAM HCL 2 MG/2ML IJ SOLN
INTRAMUSCULAR | Status: AC
  Filled 2020-08-08: qty 2

## 2020-08-08 MED ORDER — LABETALOL HCL 5 MG/ML IV SOLN
INTRAVENOUS | Status: AC
  Filled 2020-08-08: qty 4

## 2020-08-08 MED ORDER — HEPARIN (PORCINE) IN NACL 1000-0.9 UT/500ML-% IV SOLN
INTRAVENOUS | Status: AC
  Filled 2020-08-08: qty 1000

## 2020-08-08 MED ORDER — ONDANSETRON HCL 4 MG/2ML IJ SOLN: 4.0000 mg | Freq: Four times a day (QID) | INTRAMUSCULAR | Status: DC | PRN

## 2020-08-08 MED ORDER — MIDAZOLAM HCL 2 MG/2ML IJ SOLN
INTRAMUSCULAR | Status: DC | PRN
  Administered 2020-08-08: 1 mg via INTRAVENOUS

## 2020-08-08 MED ORDER — HYDRALAZINE HCL 20 MG/ML IJ SOLN
INTRAMUSCULAR | Status: AC
  Filled 2020-08-08: qty 1

## 2020-08-08 MED ORDER — ACETAMINOPHEN 325 MG PO TABS: 650.0000 mg | ORAL_TABLET | ORAL | Status: DC | PRN

## 2020-08-08 MED ORDER — HYDRALAZINE HCL 20 MG/ML IJ SOLN: 10.0000 mg | INTRAMUSCULAR | Status: DC | PRN

## 2020-08-08 MED ORDER — LABETALOL HCL 5 MG/ML IV SOLN
INTRAVENOUS | Status: DC | PRN
  Administered 2020-08-08: 10 mg via INTRAVENOUS

## 2020-08-08 MED ORDER — OXYCODONE HCL 5 MG PO TABS: 5.0000 mg | ORAL_TABLET | ORAL | Status: DC | PRN

## 2020-08-08 MED ORDER — SODIUM CHLORIDE 0.9% FLUSH: 3.0000 mL | Freq: Two times a day (BID) | INTRAVENOUS | Status: DC

## 2020-08-08 MED ORDER — SODIUM CHLORIDE 0.9 % WEIGHT BASED INFUSION
1.0000 mL/kg/h | INTRAVENOUS | Status: DC
  Administered 2020-08-08: 1 mL/kg/h via INTRAVENOUS

## 2020-08-08 MED ORDER — LIDOCAINE HCL (PF) 1 % IJ SOLN
INTRAMUSCULAR | Status: AC
  Filled 2020-08-08: qty 30

## 2020-08-08 MED ORDER — IOHEXOL 350 MG/ML SOLN
INTRAVENOUS | Status: DC | PRN
  Administered 2020-08-08: 50 mL

## 2020-08-08 MED ORDER — LABETALOL HCL 5 MG/ML IV SOLN
10.0000 mg | INTRAVENOUS | Status: DC | PRN
  Administered 2020-08-08: 10 mg via INTRAVENOUS

## 2020-08-08 MED ORDER — SODIUM CHLORIDE 0.9 % IV SOLN: INTRAVENOUS | Status: DC

## 2020-08-08 MED ORDER — METOPROLOL SUCCINATE ER 50 MG PO TB24: 50.0000 mg | ORAL_TABLET | Freq: Every day | ORAL | 11 refills | Status: DC

## 2020-08-08 MED ORDER — VERAPAMIL HCL 2.5 MG/ML IV SOLN
INTRAVENOUS | Status: AC
  Filled 2020-08-08: qty 2

## 2020-08-08 MED ORDER — HEPARIN (PORCINE) IN NACL 1000-0.9 UT/500ML-% IV SOLN
INTRAVENOUS | Status: DC | PRN
  Administered 2020-08-08 (×2): 500 mL

## 2020-08-08 MED ORDER — SODIUM CHLORIDE 0.9 % WEIGHT BASED INFUSION
3.0000 mL/kg/h | INTRAVENOUS | Status: AC
  Administered 2020-08-08: 3 mL/kg/h via INTRAVENOUS

## 2020-08-08 MED ORDER — HEPARIN SODIUM (PORCINE) 1000 UNIT/ML IJ SOLN
INTRAMUSCULAR | Status: AC
  Filled 2020-08-08: qty 1

## 2020-08-08 MED ORDER — LIDOCAINE HCL (PF) 1 % IJ SOLN
INTRAMUSCULAR | Status: DC | PRN
  Administered 2020-08-08: 30 mL via INTRADERMAL

## 2020-08-08 MED ORDER — ASPIRIN 81 MG PO CHEW: 81.0000 mg | CHEWABLE_TABLET | Freq: Every day | ORAL | Status: DC

## 2020-08-08 MED ORDER — ASPIRIN 81 MG PO CHEW: 81.0000 mg | CHEWABLE_TABLET | ORAL | Status: DC

## 2020-08-08 SURGICAL SUPPLY — 9 items
CATH INFINITI 5 FR IM (CATHETERS) ×2 IMPLANT
CATH INFINITI 5FR JL4 (CATHETERS) ×2 IMPLANT
CATH INFINITI 5FR MPB2 (CATHETERS) ×2 IMPLANT
KIT HEART LEFT (KITS) ×2 IMPLANT
PACK CARDIAC CATHETERIZATION (CUSTOM PROCEDURE TRAY) ×2 IMPLANT
SHEATH PINNACLE 5F 10CM (SHEATH) ×2 IMPLANT
TRANSDUCER W/STOPCOCK (MISCELLANEOUS) ×2 IMPLANT
TUBING CIL FLEX 10 FLL-RA (TUBING) ×2 IMPLANT
WIRE EMERALD ST .035X150CM (WIRE) ×2 IMPLANT

## 2020-08-08 NOTE — Progress Notes (Signed)
Site area- right  Site Prior to Removal- 0   Pressure Applied For-  20 MInutes   Bedrest Beginning at - 1225   Manual- Yes   Patient Status During Pull- Stable    Post Pull Groin Site- 0   Post Pull Instructions Given- Yes   Post Pull Pulses Present- Yes    Dressing Applied- Tegaderm and Gauze Dressing    Comments:

## 2020-08-08 NOTE — Interval H&P Note (Signed)
Cath Lab Visit (complete for each Cath Lab visit)  Clinical Evaluation Leading to the Procedure:   ACS: No.  Non-ACS:    Anginal Classification: CCS III  Anti-ischemic medical therapy: Maximal Therapy (2 or more classes of medications)  Non-Invasive Test Results: No non-invasive testing performed  Prior CABG: Previous CABG      History and Physical Interval Note:  08/08/2020 10:30 AM  Antonio [name]  has presented today for surgery, with the diagnosis of chest pain.  The various methods of treatment have been discussed with the patient and family. After consideration of risks, benefits and other options for treatment, the patient has consented to  Procedure(s): LEFT HEART CATH AND CORS/GRAFTS ANGIOGRAPHY (N/A) as a surgical intervention.  The patient's history has been reviewed, patient examined, no change in status, stable for surgery.  I have reviewed the patient's chart and labs.  Questions were answered to the patient's satisfaction.     Belva Crome III

## 2020-08-08 NOTE — Progress Notes (Signed)
Patient was given discharge instructions. He verbalized understanding. 

## 2020-08-08 NOTE — Discharge Instructions (Signed)
Femoral Site Care This sheet gives you information about how to care for yourself after your procedure. Your health care provider may also give you more specific instructions. If you have problems or questions, contact your health care provider. What can I expect after the procedure? After the procedure, it is common to have:  Bruising that usually fades within 1-2 weeks.  Tenderness at the site. Follow these instructions at home: Wound care  Follow instructions from your health care provider about how to take care of your insertion site. Make sure you: ? Wash your hands with soap and water before you change your bandage (dressing). If soap and water are not available, use hand sanitizer. ? Change your dressing as told by your health care provider. ? Leave stitches (sutures), skin glue, or adhesive strips in place. These skin closures may need to stay in place for 2 weeks or longer. If adhesive strip edges start to loosen and curl up, you may trim the loose edges. Do not remove adhesive strips completely unless your health care provider tells you to do that.  Do not take baths, swim, or use a hot tub until your health care provider approves.  You may shower 24-48 hours after the procedure or as told by your health care provider. ? Gently wash the site with plain soap and water. ? Pat the area dry with a clean towel. ? Do not rub the site. This may cause bleeding.  Do not apply powder or lotion to the site. Keep the site clean and dry.  Check your femoral site every day for signs of infection. Check for: ? Redness, swelling, or pain. ? Fluid or blood. ? Warmth. ? Pus or a bad smell. Activity  For the first 2-3 days after your procedure, or as long as directed: ? Avoid climbing stairs as much as possible. ? Do not squat.  Do not lift anything that is heavier than 10 lb (4.5 kg), or the limit that you are told, until your health care provider says that it is safe.  Rest as  directed. ? Avoid sitting for a long time without moving. Get up to take short walks every 1-2 hours.  Do not drive for 24 hours if you were given a medicine to help you relax (sedative). General instructions  Take over-the-counter and prescription medicines only as told by your health care provider.  Keep all follow-up visits as told by your health care provider. This is important. Contact a health care provider if you have:  A fever or chills.  You have redness, swelling, or pain around your insertion site. Get help right away if:  The catheter insertion area swells very fast.  You pass out.  You suddenly start to sweat or your skin gets clammy.  The catheter insertion area is bleeding, and the bleeding does not stop when you hold steady pressure on the area.  The area near or just beyond the catheter insertion site becomes pale, cool, tingly, or numb. These symptoms may represent a serious problem that is an emergency. Do not wait to see if the symptoms will go away. Get medical help right away. Call your local emergency services (911 in the [place] Do not drive yourself to the hospital. Summary  After the procedure, it is common to have bruising that usually fades within 1-2 weeks.  Check your femoral site every day for signs of infection.  Do not lift anything that is heavier than 10 lb (4.5 kg), or the   limit that you are told, until your health care provider says that it is safe. This information is not intended to replace advice given to you by your health care provider. Make sure you discuss any questions you have with your health care provider. Document Revised: 10/13/2017 Document Reviewed: 10/13/2017 Elsevier Patient Education  2020 [employer]  

## 2020-08-09 ENCOUNTER — Encounter (HOSPITAL_COMMUNITY): Payer: Self-pay | Admitting: Interventional Cardiology
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
## 2020-09-06 ENCOUNTER — Ambulatory Visit (INDEPENDENT_AMBULATORY_CARE_PROVIDER_SITE_OTHER): Payer: Medicare HMO | Admitting: Cardiology

## 2020-09-06 ENCOUNTER — Encounter: Payer: Self-pay | Admitting: Cardiology

## 2020-09-06 ENCOUNTER — Other Ambulatory Visit: Payer: Self-pay

## 2020-09-06 VITALS — BP 128/82 | HR 52 | Ht 67.5 in | Wt 175.0 lb

## 2020-09-06 DIAGNOSIS — I10 Essential (primary) hypertension: Secondary | ICD-10-CM | POA: Diagnosis not present

## 2020-09-06 DIAGNOSIS — Z951 Presence of aortocoronary bypass graft: Secondary | ICD-10-CM

## 2020-09-06 DIAGNOSIS — I25118 Atherosclerotic heart disease of native coronary artery with other forms of angina pectoris: Secondary | ICD-10-CM | POA: Diagnosis not present

## 2020-09-06 DIAGNOSIS — E785 Hyperlipidemia, unspecified: Secondary | ICD-10-CM | POA: Diagnosis not present

## 2020-09-06 MED ORDER — ROSUVASTATIN CALCIUM 40 MG PO TABS
40.0000 mg | ORAL_TABLET | Freq: Every day | ORAL | 3 refills | Status: DC
Start: 2020-09-06 — End: 2021-08-02

## 2020-09-15 ENCOUNTER — Ambulatory Visit: Payer: Medicare HMO | Admitting: Cardiology

## 2020-12-26 ENCOUNTER — Other Ambulatory Visit: Payer: Self-pay | Admitting: Cardiology

## 2020-12-27 ENCOUNTER — Other Ambulatory Visit: Payer: Self-pay

## 2020-12-27 ENCOUNTER — Encounter (HOSPITAL_COMMUNITY): Payer: Self-pay | Admitting: Emergency Medicine

## 2020-12-27 ENCOUNTER — Telehealth: Payer: Self-pay | Admitting: Cardiology

## 2020-12-27 ENCOUNTER — Observation Stay (HOSPITAL_COMMUNITY)
Admission: EM | Admit: 2020-12-27 | Discharge: 2020-12-28 | Disposition: A | Discharge status: Home / Self Care | Payer: Medicare HMO | Attending: Cardiology | Admitting: Cardiology

## 2020-12-27 ENCOUNTER — Emergency Department (HOSPITAL_COMMUNITY): Payer: Medicare HMO

## 2020-12-27 DIAGNOSIS — Z7984 Long term (current) use of oral hypoglycemic drugs: Secondary | ICD-10-CM | POA: Diagnosis not present

## 2020-12-27 DIAGNOSIS — Z7982 Long term (current) use of aspirin: Secondary | ICD-10-CM | POA: Insufficient documentation

## 2020-12-27 DIAGNOSIS — I25119 Atherosclerotic heart disease of native coronary artery with unspecified angina pectoris: Secondary | ICD-10-CM | POA: Diagnosis not present

## 2020-12-27 DIAGNOSIS — Z8546 Personal history of malignant neoplasm of prostate: Secondary | ICD-10-CM | POA: Insufficient documentation

## 2020-12-27 DIAGNOSIS — I200 Unstable angina: Secondary | ICD-10-CM | POA: Diagnosis not present

## 2020-12-27 DIAGNOSIS — Z87891 Personal history of nicotine dependence: Secondary | ICD-10-CM | POA: Insufficient documentation

## 2020-12-27 DIAGNOSIS — E782 Mixed hyperlipidemia: Secondary | ICD-10-CM | POA: Diagnosis not present

## 2020-12-27 DIAGNOSIS — Z79899 Other long term (current) drug therapy: Secondary | ICD-10-CM | POA: Insufficient documentation

## 2020-12-27 DIAGNOSIS — Z794 Long term (current) use of insulin: Secondary | ICD-10-CM | POA: Diagnosis not present

## 2020-12-27 DIAGNOSIS — Z951 Presence of aortocoronary bypass graft: Secondary | ICD-10-CM | POA: Diagnosis not present

## 2020-12-27 DIAGNOSIS — R0789 Other chest pain: Secondary | ICD-10-CM | POA: Diagnosis present

## 2020-12-27 DIAGNOSIS — Z20822 Contact with and (suspected) exposure to covid-19: Secondary | ICD-10-CM | POA: Insufficient documentation

## 2020-12-27 DIAGNOSIS — E785 Hyperlipidemia, unspecified: Secondary | ICD-10-CM | POA: Diagnosis present

## 2020-12-27 DIAGNOSIS — I10 Essential (primary) hypertension: Secondary | ICD-10-CM | POA: Diagnosis not present

## 2020-12-27 DIAGNOSIS — I2510 Atherosclerotic heart disease of native coronary artery without angina pectoris: Secondary | ICD-10-CM | POA: Diagnosis present

## 2020-12-27 DIAGNOSIS — E119 Type 2 diabetes mellitus without complications: Secondary | ICD-10-CM | POA: Insufficient documentation

## 2020-12-27 LAB — BASIC METABOLIC PANEL
Anion gap: 7 (ref 5–15)
BUN: 13 mg/dL (ref 8–23)
CO2: 23 mmol/L (ref 22–32)
Calcium: 8.8 mg/dL — ABNORMAL LOW (ref 8.9–10.3)
Chloride: 109 mmol/L (ref 98–111)
Creatinine, Ser: 1.18 mg/dL (ref 0.61–1.24)
GFR, Estimated: >60 mL/min (ref >60)
Glucose, Bld: 144 mg/dL — ABNORMAL HIGH (ref 70–99)
Potassium: 3.5 mmol/L (ref 3.5–5.1)
Sodium: 139 mmol/L (ref 135–145)

## 2020-12-27 LAB — CBG MONITORING, ED
Glucose-Capillary: 30 mg/dL — CL (ref 70–99)
Glucose-Capillary: 90 mg/dL (ref 70–99)
Glucose-Capillary: 146 mg/dL — ABNORMAL HIGH (ref 70–99)

## 2020-12-27 LAB — TROPONIN I (HIGH SENSITIVITY)
Troponin I (High Sensitivity): 7 ng/L (ref <18)
Troponin I (High Sensitivity): 6 ng/L (ref <18)
Troponin I (High Sensitivity): 6 ng/L (ref <18)

## 2020-12-27 LAB — CBC
HCT: 39.9 % (ref 39.0–52.0)
Hemoglobin: 12.9 g/dL — ABNORMAL LOW (ref 13.0–17.0)
MCH: 27.6 pg (ref 26.0–34.0)
MCHC: 32.3 g/dL (ref 30.0–36.0)
MCV: 85.3 fL (ref 80.0–100.0)
Platelets: 137 10*3/uL — ABNORMAL LOW (ref 150–400)
RBC: 4.68 MIL/uL (ref 4.22–5.81)
RDW: 17.8 % — ABNORMAL HIGH (ref 11.5–15.5)
WBC: 5.4 10*3/uL (ref 4.0–10.5)
nRBC: 0 % (ref 0.0–0.2)

## 2020-12-27 LAB — HEMOGLOBIN A1C
Hgb A1c MFr Bld: 7.7 % — ABNORMAL HIGH (ref 4.8–5.6)
Mean Plasma Glucose: 174.29 mg/dL

## 2020-12-27 MED ORDER — HEPARIN (PORCINE) 25000 UT/250ML-% IV SOLN
1000.0000 [IU]/h | INTRAVENOUS | Status: DC
  Administered 2020-12-27: 1000 [IU]/h via INTRAVENOUS
  Filled 2020-12-27: qty 250

## 2020-12-27 MED ORDER — ASPIRIN 81 MG PO CHEW
243.0000 mg | CHEWABLE_TABLET | Freq: Once | ORAL | Status: AC
  Administered 2020-12-27: 243 mg via ORAL
  Filled 2020-12-27: qty 3

## 2020-12-27 MED ORDER — METOPROLOL SUCCINATE ER 50 MG PO TB24
50.0000 mg | ORAL_TABLET | Freq: Every day | ORAL | Status: DC
  Administered 2020-12-27 – 2020-12-28 (×2): 50 mg via ORAL
  Filled 2020-12-27: qty 2
  Filled 2020-12-27: qty 1

## 2020-12-27 MED ORDER — SODIUM CHLORIDE 0.9% FLUSH
3.0000 mL | Freq: Two times a day (BID) | INTRAVENOUS | Status: DC
  Administered 2020-12-28 (×2): 3 mL via INTRAVENOUS

## 2020-12-27 MED ORDER — ASPIRIN EC 81 MG PO TBEC
81.0000 mg | DELAYED_RELEASE_TABLET | Freq: Every day | ORAL | Status: DC
  Administered 2020-12-27: 81 mg via ORAL
  Filled 2020-12-27: qty 1

## 2020-12-27 MED ORDER — ONDANSETRON HCL 4 MG/2ML IJ SOLN
4.0000 mg | Freq: Four times a day (QID) | INTRAMUSCULAR | Status: DC | PRN
  Filled 2020-12-27: qty 2

## 2020-12-27 MED ORDER — PRASUGREL HCL 10 MG PO TABS
10.0000 mg | ORAL_TABLET | Freq: Every day | ORAL | Status: DC
  Filled 2020-12-27: qty 1

## 2020-12-27 MED ORDER — ADULT MULTIVITAMIN W/MINERALS CH
1.0000 | ORAL_TABLET | Freq: Every day | ORAL | Status: DC
  Administered 2020-12-28: 1 via ORAL
  Filled 2020-12-27: qty 1

## 2020-12-27 MED ORDER — ADULT MULTIVITAMIN W/MINERALS CH
1.0000 | ORAL_TABLET | Freq: Every day | ORAL | Status: DC
  Administered 2020-12-27: 1 via ORAL
  Filled 2020-12-27: qty 1

## 2020-12-27 MED ORDER — NITROGLYCERIN 0.4 MG SL SUBL
0.4000 mg | SUBLINGUAL_TABLET | SUBLINGUAL | Status: DC | PRN
  Administered 2020-12-27 (×2): 0.4 mg via SUBLINGUAL
  Filled 2020-12-27: qty 1

## 2020-12-27 MED ORDER — HEPARIN BOLUS VIA INFUSION
4000.0000 [IU] | Freq: Once | INTRAVENOUS | Status: AC
  Administered 2020-12-27: 4000 [IU] via INTRAVENOUS
  Filled 2020-12-27: qty 4000

## 2020-12-27 MED ORDER — ROSUVASTATIN CALCIUM 20 MG PO TABS
40.0000 mg | ORAL_TABLET | Freq: Every day | ORAL | Status: DC
  Administered 2020-12-27 – 2020-12-28 (×2): 40 mg via ORAL
  Filled 2020-12-27 (×2): qty 2

## 2020-12-27 MED ORDER — NITROGLYCERIN 0.4 MG SL SUBL: 0.4000 mg | SUBLINGUAL_TABLET | SUBLINGUAL | Status: DC | PRN

## 2020-12-27 MED ORDER — NITROGLYCERIN 0.4 MG/HR TD PT24
0.4000 mg | MEDICATED_PATCH | Freq: Every day | TRANSDERMAL | Status: DC
  Administered 2020-12-27 – 2020-12-28 (×2): 0.4 mg via TRANSDERMAL
  Filled 2020-12-27 (×2): qty 1

## 2020-12-27 MED ORDER — INSULIN ASPART 100 UNIT/ML ~~LOC~~ SOLN
0.0000 [IU] | Freq: Three times a day (TID) | SUBCUTANEOUS | Status: DC
  Administered 2020-12-28: 5 [IU] via SUBCUTANEOUS

## 2020-12-27 NOTE — ED Notes (Signed)
Paged MD per RN request

## 2020-12-27 NOTE — Progress Notes (Signed)
ANTICOAGULATION CONSULT NOTE - Initial Consult  Pharmacy Consult for heparin Indication: chest pain/ACS  Allergies  Allergen Reactions  . Demerol Other (See Comments)    hallucinations    Patient Measurements: Height: 5' 7.5" (171.5 cm) Weight: 80.7 kg (178 lb) IBW/kg (Calculated) : 67.25 Heparin Dosing Weight: 80 kg   Vital Signs: Temp: 97.9 F (36.6 C) (03/16 1447) Temp Source: Oral (03/16 1447) BP: 143/86 (03/16 1745) Pulse Rate: 59 (03/16 1745)  Labs: Recent Labs    12/27/20 1451  HGB 12.9*  HCT 39.9  PLT 137*  CREATININE 1.18  TROPONINIHS 7    Estimated Creatinine Clearance: 53.9 mL/min (by C-G formula based on SCr of 1.18 mg/dL).   Medical History: Past Medical History:  Diagnosis Date  . Alcohol abuse, in remission   . Arthritis   . Bleeding per rectum   . BPH (benign prostatic hyperplasia)   . CAD (coronary artery disease)    a. s/p CABG 1996  b. LHC in 11/2013 w/ patent grafts. c. 12/19/14 re-look cath with patent grafts and good LVF  . Erectile dysfunction   . GERD (gastroesophageal reflux disease)   . History of lower GI bleeding   . HLD (hyperlipidemia)   . HTN (hypertension)   . Prostate cancer ([place])    a. s/p radiation   . S/P angioplasty with stent-DES ostial VG to PDA and DES ostial VG to diag. 08/27/19 08/28/2019  . Type II diabetes mellitus ([facility])     Medications:  (Not in a hospital admission)   Assessment: 14 YOM who presents with chest pain. Pt has h/o significant CAD that is being medically managed. Pharmacy consulted to start IV heparin.   Hgb and Plt low. SCr wnl  Goal of Therapy:  Heparin level 0.3-0.7 units/ml Monitor platelets by anticoagulation protocol: Yes   Plan:  -Heparin 4000 units IV bolus followed by heparin infusion at 1000 units/hr  -F/u 8 hr HL -Monitor daily HL, CBC and s/s of bleeding  -Diagnostic cath planned in AM  Albertina Parr, PharmD., BCPS, BCCCP Clinical Pharmacist Please refer to [facility] for  unit-specific pharmacist

## 2020-12-27 NOTE — H&P (Signed)
Cardiology Admission History and Physical:   Patient ID: Antonio [name]; [id number]; 01-10-1946   Admission date: 12/27/2020  Primary Care Provider: [name], MD Primary Cardiologist: Peter Martinique, MD Primary Electrophysiologist: None  Chief Complaint: Chest and left arm discomfort  Patient Profile:   Antonio [name] is a 75 y.o. male with a history of CAD status post CABG in 1996, subsequent DES to the SVG to PDA and DES to the SVG to diagonal in 2020, GERD, hypertension, hyperlipidemia, and type 2 diabetes mellitus.  History of Present Illness:   Antonio [name] presents to the ER complaining of a 7-day history of left lower costal thoracic discomfort, intermittently radiating to the left arm, waxing and waning but worse with exertion.  He does not recall any precipitant, thought that he may have "pulled a muscle" and did not seek medical attention initially.  He filled some nitroglycerin and took a dose earlier today, second dose helped somewhat but pain did not improve significantly.  He called the office and was told to come in for further evaluation in the emergency room.  He underwent a diagnostic cardiac catheterization in October 2021 in the setting of chest pain.  Preceding myocardial perfusion imaging at that time indicated inferior infarct scar with mild peri-infarct ischemia.  He was found to have multivessel CAD with patent stent site in the SVG to PDA followed by 99% distal PDA stenosis that was managed medically, patent SVG to the obtuse marginal, patent stent site within the SVG to diagonal, and patent LIMA to LAD.  Beta-blocker was added as antianginal and he has done well since then.  He reports compliance with his medications including aspirin and Effient.  No other associated symptoms other than sense of mild shortness of breath.  No nausea or emesis.  No cough or shortness of breath.  No hemoptysis.   Past Medical History:  Diagnosis Date  . Alcohol abuse, in  remission   . Arthritis   . Bleeding per rectum   . BPH (benign prostatic hyperplasia)   . CAD (coronary artery disease)    a. s/p CABG 1996  b. LHC in 11/2013 w/ patent grafts. c. 12/19/14 re-look cath with patent grafts and good LVF  . Erectile dysfunction   . GERD (gastroesophageal reflux disease)   . History of lower GI bleeding   . HLD (hyperlipidemia)   . HTN (hypertension)   . Prostate cancer ([place])    a. s/p radiation   . S/P angioplasty with stent-DES ostial VG to PDA and DES ostial VG to diag. 08/27/19 08/28/2019  . Type II diabetes mellitus ([place])     Past Surgical History:  Procedure Laterality Date  . CARDIAC CATHETERIZATION  01/27/2009   ef 60%  . CARDIAC CATHETERIZATION  08/25/2012   Severe 3v obstructive CAD, continued graft patency (SVG-D,  SVG-OM1-OM2, SVG-PDA, LIMA-LAD). area of diffuse dz in the distal LCx (up to 90%) unamenable to PCI  . CERVICAL DISC SURGERY  ? 1990's   "went in on the side" (09/03/2012)  . CORONARY ARTERY BYPASS GRAFT  1996   LIMA GRAFT TO THE LAD, SAPHENOUS VEIN GRAFT SEQUENTIALLY TO THE FIRST AND SECOND OBTUSE MARGINAL VESSELS, SAPHENOUS VEIN GRAFT TO THE DIAGONAL, AND SAPHENOUS VEIN GRAFT TO THE DISTAL RIGHT CORONARY   . CORONARY STENT INTERVENTION N/A 08/27/2019   Procedure: CORONARY STENT INTERVENTION;  Surgeon: Martinique, Peter M, MD;  Location: [place] [facility];  Service: Cardiovascular;  Laterality: N/A;  . ESOPHAGOGASTRODUODENOSCOPY Left 11/19/2013  Procedure: ESOPHAGOGASTRODUODENOSCOPY (EGD);  Surgeon: Arta Silence, MD;  Location: [facility] ENDOSCOPY;  Service: Endoscopy;  Laterality: Left;  . FOOT SURGERY  1980's   LEFT, "shot a nail gun thru it" (09/03/2012)  . LACERATION REPAIR  1980's   LEFT HAND  . LEFT HEART CATH AND CORS/GRAFTS ANGIOGRAPHY N/A 08/26/2019   Procedure: LEFT HEART CATH AND CORS/GRAFTS ANGIOGRAPHY;  Surgeon: Martinique, Peter M, MD;  Location: [place] [facility];  Service: Cardiovascular;  Laterality: N/A;  . LEFT HEART CATH  AND CORS/GRAFTS ANGIOGRAPHY N/A 08/08/2020   Procedure: LEFT HEART CATH AND CORS/GRAFTS ANGIOGRAPHY;  Surgeon: Belva Crome, MD;  Location: [facility];  Service: Cardiovascular;  Laterality: N/A;  . LEFT HEART CATHETERIZATION WITH CORONARY ANGIOGRAM N/A 09/04/2012   Procedure: LEFT HEART CATHETERIZATION WITH CORONARY ANGIOGRAM;  Surgeon: Peter M Martinique, MD;  Location: [facility] CATH LAB;  Service: Cardiovascular;  Laterality: N/A;  . LEFT HEART CATHETERIZATION WITH CORONARY/GRAFT ANGIOGRAM N/A 11/22/2013   Procedure: LEFT HEART CATHETERIZATION WITH Beatrix Fetters;  Surgeon: Jettie Booze, MD;  Location: [facility] CATH LAB;  Service: Cardiovascular;  Laterality: N/A;  . LEFT HEART CATHETERIZATION WITH CORONARY/GRAFT ANGIOGRAM N/A 12/19/2014   Procedure: LEFT HEART CATHETERIZATION WITH Beatrix Fetters;  Surgeon: Peter M Martinique, MD;  Location: [facility] CATH LAB;  Service: Cardiovascular;  Laterality: N/A;     Medications Prior to Admission: Prior to Admission medications   Medication Sig Start Date End Date Taking? Authorizing Provider  aspirin EC 81 MG tablet Take 81 mg by mouth daily.    [provider]  Insulin Pen Needle (BD PEN NEEDLE NANO U/F) 32G X 4 MM MISC 1 pen by Does not apply route daily. 11/11/13   Kyra Leyland, PA-C  isosorbide mononitrate (IMDUR) 60 MG 24 hr tablet Take 1.5 tablets (90 mg total) by mouth daily. 02/21/20   Martinique, Peter M, MD  JANUVIA 100 MG tablet Take 100 mg by mouth daily. 08/15/20   [provider]  LEVEMIR FLEXTOUCH 100 UNIT/ML Pen Inject 45 Units into the skin daily.  01/15/16   [provider]  metoprolol succinate (TOPROL XL) 50 MG 24 hr tablet Take 1 tablet (50 mg total) by mouth daily. Take with or immediately following a meal. 08/08/20 08/08/21  Belva Crome, MD  Multiple Vitamin (MULTIVITAMIN WITH MINERALS) TABS Take 1 tablet by mouth daily.    [provider]  nitroGLYCERIN (NITROSTAT) 0.4 MG SL tablet PLACE  1 TABLET UNDER THE TONGUE EVERY 5 MINUTES FOR 3 DOSES AS NEEDED FOR CHEST PAIN 12/26/20   Martinique, Peter M, MD  Pramoxine-Camphor-Zinc Acetate (ANTI [facility] EX) Apply 1 application topically daily as needed (itching).     [provider]  prasugrel (EFFIENT) 10 MG TABS tablet Take 1 tablet (10 mg total) by mouth daily. 08/04/20   Martinique, Peter M, MD  rosuvastatin (CRESTOR) 40 MG tablet Take 1 tablet (40 mg total) by mouth daily. 09/06/20   Martinique, Peter M, MD  SitaGLIPtin-MetFORMIN HCl [phone] MG TB24 Take 1 tablet by mouth daily. 08/30/19   Isaiah Serge, NP  traMADol (ULTRAM) 50 MG tablet Take 1 tablet (50 mg total) by mouth 3 (three) times daily as needed for moderate pain. 11/01/19   Lendon Colonel, NP     Allergies:    Allergies  Allergen Reactions  . Demerol Other (See Comments)    hallucinations    Social History:   Social History   Tobacco Use  . Smoking status: Former Smoker  Packs/day: 1.00    Years: 37.00    Pack years: 37.00    Types: Cigarettes    Quit date: 10/14/2002    Years since quitting: 18.2  . Smokeless tobacco: Never Used  Substance Use Topics  . Alcohol use: Yes    Alcohol/week: 0.0 standard drinks    Comment: 6 pack beer per week    Family History:  The patient's family history includes CAD in his brother; Cancer in his mother and other family members; Diabetes in an other family member; Hypertension in his father, mother, and another family member.    ROS:  Please see the history of present illness.  All other ROS reviewed and negative.     Physical Exam/Data:   Vitals:   12/27/20 1447 12/27/20 1449 12/27/20 1745  BP: 109/69  (!) 143/86  Pulse: 62  (!) 59  Resp: 16  15  Temp: 97.9 F (36.6 C)    TempSrc: Oral    SpO2: 100%  94%  Weight:  80.7 kg   Height:  5' 7.5" (1.715 m)    No intake or output data in the 24 hours ending 12/27/20 1849 Filed Weights   12/27/20 1449  Weight: 80.7 kg   Body mass index is 27.47 kg/m.    Gen: Patient appears comfortable at rest. HEENT: Conjunctiva and lids normal, oropharynx clear with moist mucosa. Neck: Supple, no elevated JVP or carotid bruits, no thyromegaly. Lungs: Clear to auscultation, nonlabored breathing at rest. Cardiac: Regular rate and rhythm, no S3 or significant systolic murmur, no pericardial rub. Abdomen: Soft, nontender, bowel sounds present. Extremities: No pitting edema, distal pulses 2+. Skin: Warm and dry. Musculoskeletal: No kyphosis. Neuropsychiatric: Alert and oriented x3, affect grossly appropriate.   EKG:  An ECG dated 12/27/2020 was personally reviewed today and demonstrated:  Sinus rhythm with PVC, old inferior infarct pattern, ST-T wave abnormalities in the anterolateral leads that are new in comparison to prior tracing.  Relevant CV Studies:  Echocardiogram 07/17/2020: 1. Apex not well visualized but may be hypokinetic Basal inferior wall  hypokinesis . Left ventricular ejection fraction, by estimation, is 55%.  The left ventricle has normal function. The left ventricle has no regional  wall motion abnormalities. Left  ventricular diastolic parameters were normal.  2. Right ventricular systolic function is normal. The right ventricular  size is normal. There is normal pulmonary artery systolic pressure.  3. Left atrial size was mildly dilated.  4. The mitral valve is normal in structure. Mild mitral valve  regurgitation. No evidence of mitral stenosis.  5. Tricuspid valve regurgitation is moderate.  6. The aortic valve is normal in structure. Aortic valve regurgitation is  trivial. No aortic stenosis is present.  7. The inferior vena cava is normal in size with greater than 50%  respiratory variability, suggesting right atrial pressure of 3 mmHg.   Cardiac catheterization 08/08/2020:  The left ventricular ejection fraction is 45-50% by visual estimate.  The left ventricular systolic function is normal.    Patent,  calcified left main without high-grade obstruction.  Total occlusion of the proximal to mid LAD.  Total occlusion of the proximal to mid circumflex.  Total occlusion of the proximal RCA  Including the recently placed stent in the saphenous vein graft to the PDA.  The PDA contains a 99% stenosis beyond the distal graft insertion site.  Saphenous vein graft sequential to the obtuse marginals is widely patent.  Saphenous vein graft to the diagonal including the recently placed proximal stent,  widely patent.  LIMA to LAD is widely patent.  Normal left ventricular function.  EF at least 50%.  LVEDP normal.  Significant blood pressure elevation during the procedure.  RECOMMENDATIONS:   Intensify medical therapy for angina.  Patient is on 90 mg of isosorbide mononitrate.  He never started beta-blocker therapy as was recommended by Dr. Martinique.  Start metoprolol succinate 50 mg/day.  Use nitroglycerin sublingually for any prolonged episodes of chest discomfort.  Home later today assuming no bleeding from femoral cath site.  Laboratory Data:  Chemistry Recent Labs  Lab 12/27/20 1451  NA 139  K 3.5  CL 109  CO2 23  GLUCOSE 144*  BUN 13  CREATININE 1.18  CALCIUM 8.8*  GFRNONAA >60  ANIONGAP 7    No results for input(s): PROT, ALBUMIN, AST, ALT, ALKPHOS, BILITOT in the last 168 hours. Hematology Recent Labs  Lab 12/27/20 1451  WBC 5.4  RBC 4.68  HGB 12.9*  HCT 39.9  MCV 85.3  MCH 27.6  MCHC 32.3  RDW 17.8*  PLT 137*   Cardiac Enzymes Recent Labs  Lab 12/27/20 1451  TROPONINIHS 7    Radiology/Studies:  DG Chest 2 View  Result Date: 12/27/2020 CLINICAL DATA:  Chest pain. EXAM: CHEST - 2 VIEW COMPARISON:  July 16, 2020 FINDINGS: Multiple sternal wires and vascular clips are seen. There is no evidence of acute infiltrate, pleural effusion or pneumothorax. The heart size and mediastinal contours are within normal limits. A radiopaque fusion plate is seen  overlying the lower cervical spine. Degenerative changes seen throughout the thoracic spine. IMPRESSION: 1. Evidence of prior median sternotomy/CABG. 2. No acute or active cardiopulmonary disease. Electronically Signed   By: Virgina Norfolk M.D.   On: 12/27/2020 15:53    Assessment and Plan:   1.  Waxing and waning chest discomfort with intermittent left arm pain concerning for unstable angina.  ECG does show new anterolateral ST-T wave abnormalities in comparison to prior tracing, otherwise old inferior infarct pattern.  Initial high-sensitivity troponin I level is normal.  2.  Multivessel CAD status post CABG in 1996 with subsequent DES to the SVG to PDA and DES to the SVG to diagonal in 2020.  Bypass grafts and stents were patent at follow-up angiography in October 2021. Patient reports compliance with medical therapy as an outpatient including aspirin, Effient, Imdur, Toprol-XL, Crestor, and as needed nitroglycerin.  3.  Mixed hyperlipidemia, continues on Crestor.  4.  Essential hypertension, blood pressure has been well controlled in general and patient reports compliance with current medical therapy.  5.  Type 2 diabetes mellitus, on Januvia and insulin.  He follows with primary care.  Hemoglobin A1c 7.7% in October 2021.  Patient will be admitted to the cardiology service for further evaluation.  Continue outpatient medical regimen with addition of IV heparin, continue to cycle cardiac markers and follow-up ECG in the morning.  He will be scheduled for diagnostic cardiac catheterization for tomorrow.  We discussed the risks and benefits and he is in agreement to proceed.  Signed, Rozann Lesches, MD  12/27/2020 6:49 PM

## 2020-12-27 NOTE — ED Notes (Signed)
Pt asked this RN to check his sugar because he was feeling that his sugar was low and he was feeling nauseated and a little dizzy. Pt has not eaten all day and this RN requested a tray to be brought at [place]. This RN checked CBG twice. First was 32 and repeat was 30.

## 2020-12-27 NOTE — ED Provider Notes (Signed)
[facility] EMERGENCY DEPARTMENT Provider Note   CSN: [id number] Arrival date & time: 12/27/20  1421     History Chief Complaint  Patient presents with  . Chest Pain    Antonio [name] is a 75 y.o. male hx of CAD s/p CABG and stent, reflux, hypertension, here presenting with chest pressure and shortness of breath.  Patient states that he has constant chest pressure for the last 8 days.  He states that it never goes away but it gets worse when he exerts himself.  Has some subjective shortness of breath when he exerts himself as well.  Patient states that he had a stent that was occluded on his previous cardiac catheterization.  He was put on Imdur after the hospital admission.  He states that he has been compliant with his medicines.  Denies any leg swelling.  Denies any fevers or chills or cough  The history is provided by the patient.       Past Medical History:  Diagnosis Date  . Alcohol abuse, in remission   . Arthritis   . Bleeding per rectum    "related to prostate cancer and hemorrhoids" (09/03/2012)  . BPH (benign prostatic hyperplasia)   . CAD (coronary artery disease)    a. s/p CABG 1996  b. LHC in 11/2013 w/ patent grafts. c. 12/19/14 re-look cath with patent grafts and good LVF  . Erectile dysfunction   . GERD (gastroesophageal reflux disease)   . History of lower GI bleeding   . HLD (hyperlipidemia)   . HTN (hypertension)   . Prostate cancer ([place])    a. s/p radiation   . S/P angioplasty with stent-DES ostial VG to PDA and DES ostial VG to diag. 08/27/19 08/28/2019  . Type II diabetes mellitus [facility])     Patient Active Problem List   Diagnosis Date Noted  . GERD (gastroesophageal reflux disease)   . Hypotension   . AKI (acute kidney injury) ([place])   . Thrombocytopenia ([place])   . S/P angioplasty with stent-DES ostial VG to PDA and DES ostial VG to diag. 08/27/19 08/28/2019  . S/P CABG (coronary artery bypass graft)   . Hypertensive heart disease  without heart failure   . Chest pain 08/09/2015  . Hypokalemia 08/09/2015  . HLD (hyperlipidemia) 08/09/2015  . Chronic kidney disease 08/09/2015  . CAD (coronary artery disease)   . History of lower GI bleeding   . Erectile dysfunction   . Skin lesion 05/28/2014  . 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 Prostate cancer ([place]) 01/28/2014  . Unstable angina ([place]) 09/03/2012  . Lower GI bleeding 02/12/2012  . BRBPR (bright red blood per rectum) 01/03/2012  . Type II diabetes mellitus ([place]) 12/17/2010  . Hyperlipidemia associated with type 2 diabetes mellitus ([place]) 12/17/2010  . 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 HTN (hypertension) 12/17/2010  . BENIGN PROSTATIC HYPERTROPHY, WITH OBSTRUCTION 12/17/2010  . BPH (benign prostatic hyperplasia) 12/17/2010    Past Surgical History:  Procedure Laterality Date  . CARDIAC CATHETERIZATION  01/27/2009   ef 60%  . CARDIAC CATHETERIZATION  08/25/2012   Severe 3v obstructive CAD, continued graft patency (SVG-D,  SVG-OM1-OM2, SVG-PDA, LIMA-LAD). area of diffuse dz in the distal LCx (up to 90%) unamenable to PCI  . CERVICAL DISC SURGERY  ? 1990's   "went in on the side" (09/03/2012)  . CORONARY ARTERY BYPASS GRAFT  1996   LIMA GRAFT TO THE LAD, SAPHENOUS VEIN GRAFT SEQUENTIALLY TO THE FIRST AND SECOND OBTUSE  MARGINAL VESSELS, SAPHENOUS VEIN GRAFT TO THE DIAGONAL, AND SAPHENOUS VEIN GRAFT TO THE DISTAL RIGHT CORONARY   . CORONARY STENT INTERVENTION N/A 08/27/2019   Procedure: CORONARY STENT INTERVENTION;  Surgeon: Martinique, Peter M, MD;  Location: [place] CV LAB;  Service: Cardiovascular;  Laterality: N/A;  . ESOPHAGOGASTRODUODENOSCOPY Left 11/19/2013   Procedure: ESOPHAGOGASTRODUODENOSCOPY (EGD);  Surgeon: Arta Silence, MD;  Location: [facility] ENDOSCOPY;  Service: Endoscopy;  Laterality: Left;  . FOOT SURGERY  1980's   LEFT, "shot a nail gun thru it" (09/03/2012)  . LACERATION REPAIR  1980's   LEFT HAND  . LEFT HEART CATH AND CORS/GRAFTS  ANGIOGRAPHY N/A 08/26/2019   Procedure: LEFT HEART CATH AND CORS/GRAFTS ANGIOGRAPHY;  Surgeon: Martinique, Peter M, MD;  Location: [place] CV LAB;  Service: Cardiovascular;  Laterality: N/A;  . LEFT HEART CATH AND CORS/GRAFTS ANGIOGRAPHY N/A 08/08/2020   Procedure: LEFT HEART CATH AND CORS/GRAFTS ANGIOGRAPHY;  Surgeon: Belva Crome, MD;  Location: [place] CV LAB;  Service: Cardiovascular;  Laterality: N/A;  . LEFT HEART CATHETERIZATION WITH CORONARY ANGIOGRAM N/A 09/04/2012   Procedure: LEFT HEART CATHETERIZATION WITH CORONARY ANGIOGRAM;  Surgeon: Peter M Martinique, MD;  Location: [facility] CATH LAB;  Service: Cardiovascular;  Laterality: N/A;  . LEFT HEART CATHETERIZATION WITH CORONARY/GRAFT ANGIOGRAM N/A 11/22/2013   Procedure: LEFT HEART CATHETERIZATION WITH Beatrix Fetters;  Surgeon: Jettie Booze, MD;  Location: [facility] CATH LAB;  Service: Cardiovascular;  Laterality: N/A;  . LEFT HEART CATHETERIZATION WITH CORONARY/GRAFT ANGIOGRAM N/A 12/19/2014   Procedure: LEFT HEART CATHETERIZATION WITH Beatrix Fetters;  Surgeon: Peter M Martinique, MD;  Location: [facility] CATH LAB;  Service: Cardiovascular;  Laterality: N/A;       Family History  Problem Relation Age of Onset  . Cancer Mother        breast  . Hypertension Mother   . Hypertension Father   . Diabetes Other        2 siblings, 1 of whom is deceased  . Hypertension Other   . Cancer Other        Lung Cancer  . Cancer Other        FH of Prostate Cancer 1st degree relative  . CAD Brother     Social History   Tobacco Use  . Smoking status: Former Smoker    Packs/day: 1.00    Years: 37.00    Pack years: 37.00    Types: Cigarettes    Quit date: 10/14/2002    Years since quitting: 18.2  . Smokeless tobacco: Never Used  Vaping Use  . Vaping Use: Never used  Substance Use Topics  . Alcohol use: Yes    Alcohol/week: 0.0 standard drinks    Comment: 6 pack beer per week  . Drug use: No    Home Medications Prior to Admission  medications   Medication Sig Start Date End Date Taking? Authorizing Provider  aspirin EC 81 MG tablet Take 81 mg by mouth daily.    [provider]  Insulin Pen Needle (BD PEN NEEDLE NANO U/F) 32G X 4 MM MISC 1 pen by Does not apply route daily. 11/11/13   Kyra Leyland, PA-C  isosorbide mononitrate (IMDUR) 60 MG 24 hr tablet Take 1.5 tablets (90 mg total) by mouth daily. 02/21/20   Martinique, Peter M, MD  JANUVIA 100 MG tablet Take 100 mg by mouth daily. 08/15/20   [provider]  LEVEMIR FLEXTOUCH 100 UNIT/ML Pen Inject 45 Units into the skin daily.  01/15/16   [provider]  metoprolol succinate (TOPROL XL) 50 MG 24 hr tablet Take 1 tablet (50 mg total) by mouth daily. Take with or immediately following a meal. 08/08/20 08/08/21  Belva Crome, MD  Multiple Vitamin (MULTIVITAMIN WITH MINERALS) TABS Take 1 tablet by mouth daily.    [provider]  nitroGLYCERIN (NITROSTAT) 0.4 MG SL tablet PLACE 1 TABLET UNDER THE TONGUE EVERY 5 MINUTES FOR 3 DOSES AS NEEDED FOR CHEST PAIN 12/26/20   Martinique, Peter M, MD  Pramoxine-Camphor-Zinc Acetate (ANTI [facility] EX) Apply 1 application topically daily as needed (itching).     [provider]  prasugrel (EFFIENT) 10 MG TABS tablet Take 1 tablet (10 mg total) by mouth daily. 08/04/20   Martinique, Peter M, MD  rosuvastatin (CRESTOR) 40 MG tablet Take 1 tablet (40 mg total) by mouth daily. 09/06/20   Martinique, Peter M, MD  SitaGLIPtin-MetFORMIN HCl [phone] MG TB24 Take 1 tablet by mouth daily. 08/30/19   Isaiah Serge, NP  traMADol (ULTRAM) 50 MG tablet Take 1 tablet (50 mg total) by mouth 3 (three) times daily as needed for moderate pain. 11/01/19   Lendon Colonel, NP    Allergies    Demerol  Review of Systems   Review of Systems  Cardiovascular: Positive for chest pain.  All other systems reviewed and are negative.   Physical Exam Updated Vital Signs BP 109/69   Pulse 62   Temp 97.9 F (36.6 C) (Oral)    Resp 16   Ht 5' 7.5" (1.715 m)   Wt 80.7 kg   SpO2 100%   BMI 27.47 kg/m   Physical Exam Vitals and nursing note reviewed.  Constitutional:      Comments: Chronically ill  HENT:     Head: Normocephalic.  Eyes:     Extraocular Movements: Extraocular movements intact.     Pupils: Pupils are equal, round, and reactive to light.  Cardiovascular:     Rate and Rhythm: Normal rate and regular rhythm.     Heart sounds: Normal heart sounds.  Pulmonary:     Effort: Pulmonary effort is normal.  Abdominal:     General: Bowel sounds are normal.     Palpations: Abdomen is soft.  Musculoskeletal:        General: Normal range of motion.     Cervical back: Normal range of motion and neck supple.  Skin:    General: Skin is warm.     Capillary Refill: Capillary refill takes less than 2 seconds.  Neurological:     General: No focal deficit present.     Mental Status: He is alert.  Psychiatric:        Mood and Affect: Mood normal.        Behavior: Behavior normal.     ED Results / Procedures / Treatments   Labs (all labs ordered are listed, but only abnormal results are displayed) Labs Reviewed  CBC - Abnormal; Notable for the following components:      Result Value   Hemoglobin 12.9 (*)    RDW 17.8 (*)    Platelets 137 (*)    All other components within normal limits  BASIC METABOLIC PANEL  TROPONIN I (HIGH SENSITIVITY)  TROPONIN I (HIGH SENSITIVITY)    EKG EKG Interpretation  Date/Time:  Wednesday December 27 2020 14:58:09 EDT Ventricular Rate:  70 PR Interval:  172 QRS Duration: 86 QT Interval:  392 QTC Calculation: 423 R Axis:   43 Text Interpretation: Sinus rhythm with  occasional Premature ventricular complexes Cannot rule out Inferior infarct , age undetermined Cannot rule out Anterior infarct , age undetermined T wave abnormality, consider lateral ischemia Abnormal ECG No significant change since last tracing Confirmed by Wandra Arthurs [id number]) on 12/27/2020 4:24:43  PM   Radiology DG Chest 2 View  Result Date: 12/27/2020 CLINICAL DATA:  Chest pain. EXAM: CHEST - 2 VIEW COMPARISON:  July 16, 2020 FINDINGS: Multiple sternal wires and vascular clips are seen. There is no evidence of acute infiltrate, pleural effusion or pneumothorax. The heart size and mediastinal contours are within normal limits. A radiopaque fusion plate is seen overlying the lower cervical spine. Degenerative changes seen throughout the thoracic spine. IMPRESSION: 1. Evidence of prior median sternotomy/CABG. 2. No acute or active cardiopulmonary disease. Electronically Signed   By: Virgina Norfolk M.D.   On: 12/27/2020 15:53    Procedures Procedures   CRITICAL CARE Performed by: Wandra Arthurs   Total critical care time: 30 minutes  Critical care time was exclusive of separately billable procedures and treating other patients.  Critical care was necessary to treat or prevent imminent or life-threatening deterioration.  Critical care was time spent personally by me on the following activities: development of treatment plan with patient and/or surrogate as well as nursing, discussions with consultants, evaluation of patient's response to treatment, examination of patient, obtaining history from patient or surrogate, ordering and performing treatments and interventions, ordering and review of laboratory studies, ordering and review of radiographic studies, pulse oximetry and re-evaluation of patient's condition.  Medications Ordered in ED Medications  aspirin chewable tablet 243 mg (has no administration in time range)  nitroGLYCERIN (NITROSTAT) SL tablet 0.4 mg (has no administration in time range)    ED Course  I have reviewed the triage vital signs and the nursing notes.  Pertinent labs & imaging results that were available during my care of the patient were reviewed by me and considered in my medical decision making (see chart for details).    MDM Rules/Calculators/A&P                          Antonio [name] is a 75 y.o. male here presenting with chest pressure.  He has chest pressure at rest and worse with exertion.  He has CAD with an occluded graft.  I am concerned for unstable angina.  Plan to get CBC and BMP and troponin.  Will consult cardiology  5 pm Troponin is normal. However, concerned for unstable angina. Consulted Dr. Ellyn Hack from cardiology   7 pm Cardiology will admit for unstable angina and will start and heparin and plan to cath in AM.    Final Clinical Impression(s) / ED Diagnoses Final diagnoses:  None    Rx / DC Orders ED Discharge Orders    None       Drenda Freeze, MD 12/27/20 [phone]

## 2020-12-27 NOTE — ED Notes (Addendum)
Secretary to page Doctor so this RN can notify of critical glucose.Pt will be given two orange juice and 4 sugar

## 2020-12-27 NOTE — Telephone Encounter (Signed)
Pt c/o of Chest Pain: STAT if CP now or developed within 24 hours  1. Are you having CP right now? yes  2. Are you experiencing any other symptoms (ex. SOB, nausea, vomiting, sweating)? no  3. How long have you been experiencing CP? About a week  4. Is your CP continuous or coming and going? continuous   5. Have you taken Nitroglycerin? Took 2 nitroglycerin yesterday ?  Patient states he has been having chest pain for about a week. He states he took 2 nitroglycerin yesterday, but it did not help. He states he also has pain in his left arm. He states it has been continuous, but he doesn't notice it when he is sleeping.

## 2020-12-27 NOTE — Telephone Encounter (Signed)
Agree  Aryka Coonradt MD, FACC   

## 2020-12-27 NOTE — ED Notes (Signed)
Dr. Hal Hope notified of CBG of 146 (per order set) - no further orders at this time

## 2020-12-27 NOTE — Telephone Encounter (Signed)
Spoke with pt re chest pain Per pt has had chest pain for 7 days and it has been continuous .Also has had  some left arm pain. as well. Per pt tried taking ntg yesterday with no relief Instructed pt to go to ED via ambulance or someone to drive him Will forward to Dr Martinique for review .Adonis Housekeeper

## 2020-12-27 NOTE — ED Triage Notes (Signed)
Onset 7-8 days ago developed chest pain radiating to left upper extremity and intermittent shortness of breath.

## 2020-12-27 NOTE — ED Notes (Signed)
Dr. Hal Hope notified of low CBG and notified that pt was given OJ and sugar. This RN to recheck sugar. Dr. Hal Hope said no need to notify if it is normal.  PT requesting another OJ.

## 2020-12-28 ENCOUNTER — Encounter (HOSPITAL_COMMUNITY)
Admission: EM | Disposition: A | Discharge status: Home / Self Care | Payer: Self-pay | Source: Home / Self Care | Attending: Emergency Medicine

## 2020-12-28 DIAGNOSIS — I200 Unstable angina: Secondary | ICD-10-CM | POA: Diagnosis not present

## 2020-12-28 DIAGNOSIS — I25119 Atherosclerotic heart disease of native coronary artery with unspecified angina pectoris: Secondary | ICD-10-CM | POA: Diagnosis not present

## 2020-12-28 DIAGNOSIS — Z951 Presence of aortocoronary bypass graft: Secondary | ICD-10-CM | POA: Diagnosis not present

## 2020-12-28 DIAGNOSIS — I25719 Atherosclerosis of autologous vein coronary artery bypass graft(s) with unspecified angina pectoris: Secondary | ICD-10-CM

## 2020-12-28 DIAGNOSIS — I10 Essential (primary) hypertension: Secondary | ICD-10-CM

## 2020-12-28 DIAGNOSIS — E782 Mixed hyperlipidemia: Secondary | ICD-10-CM

## 2020-12-28 HISTORY — PX: LEFT HEART CATH AND CORS/GRAFTS ANGIOGRAPHY: CATH118250

## 2020-12-28 LAB — CBC
HCT: 41.9 % (ref 39.0–52.0)
Hemoglobin: 13.2 g/dL (ref 13.0–17.0)
MCH: 26.7 pg (ref 26.0–34.0)
MCHC: 31.5 g/dL (ref 30.0–36.0)
MCV: 84.8 fL (ref 80.0–100.0)
Platelets: 122 10*3/uL — ABNORMAL LOW (ref 150–400)
RBC: 4.94 MIL/uL (ref 4.22–5.81)
RDW: 17.5 % — ABNORMAL HIGH (ref 11.5–15.5)
WBC: 5.4 10*3/uL (ref 4.0–10.5)
nRBC: 0 % (ref 0.0–0.2)

## 2020-12-28 LAB — CBG MONITORING, ED
Glucose-Capillary: 32 mg/dL — CL (ref 70–99)
Glucose-Capillary: 71 mg/dL (ref 70–99)

## 2020-12-28 LAB — LIPID PANEL
Cholesterol: 124 mg/dL (ref 0–200)
HDL: 48 mg/dL (ref >40)
LDL Cholesterol: 59 mg/dL (ref 0–99)
Total CHOL/HDL Ratio: 2.6 RATIO
Triglycerides: 87 mg/dL (ref <150)
VLDL: 17 mg/dL (ref 0–40)

## 2020-12-28 LAB — TROPONIN I (HIGH SENSITIVITY): Troponin I (High Sensitivity): 10 ng/L (ref <18)

## 2020-12-28 LAB — HEMOGLOBIN A1C
Hgb A1c MFr Bld: 7.6 % — ABNORMAL HIGH (ref 4.8–5.6)
Mean Plasma Glucose: 171.42 mg/dL

## 2020-12-28 LAB — RESP PANEL BY RT-PCR (FLU A&B, COVID) ARPGX2
Influenza A by PCR: NEGATIVE
Influenza B by PCR: NEGATIVE
SARS Coronavirus 2 by RT PCR: NEGATIVE

## 2020-12-28 LAB — GLUCOSE, CAPILLARY
Glucose-Capillary: 69 mg/dL — ABNORMAL LOW (ref 70–99)
Glucose-Capillary: 230 mg/dL — ABNORMAL HIGH (ref 70–99)

## 2020-12-28 LAB — HEPARIN LEVEL (UNFRACTIONATED): Heparin Unfractionated: 0.36 IU/mL (ref 0.30–0.70)

## 2020-12-28 SURGERY — LEFT HEART CATH AND CORS/GRAFTS ANGIOGRAPHY
Anesthesia: LOCAL

## 2020-12-28 MED ORDER — ASPIRIN 81 MG PO CHEW: 81.0000 mg | CHEWABLE_TABLET | ORAL | Status: DC

## 2020-12-28 MED ORDER — MIDAZOLAM HCL 2 MG/2ML IJ SOLN
INTRAMUSCULAR | Status: AC
  Filled 2020-12-28: qty 2

## 2020-12-28 MED ORDER — ACETAMINOPHEN 325 MG PO TABS
650.0000 mg | ORAL_TABLET | ORAL | Status: DC | PRN
  Administered 2020-12-28: 650 mg via ORAL
  Filled 2020-12-28: qty 2

## 2020-12-28 MED ORDER — HEPARIN (PORCINE) IN NACL 1000-0.9 UT/500ML-% IV SOLN
INTRAVENOUS | Status: AC
  Filled 2020-12-28: qty 500

## 2020-12-28 MED ORDER — SODIUM CHLORIDE 0.9% FLUSH
3.0000 mL | Freq: Two times a day (BID) | INTRAVENOUS | Status: DC
  Administered 2020-12-28: 3 mL via INTRAVENOUS

## 2020-12-28 MED ORDER — ASPIRIN EC 81 MG PO TBEC: 81.0000 mg | DELAYED_RELEASE_TABLET | Freq: Every day | ORAL | Status: DC

## 2020-12-28 MED ORDER — SODIUM CHLORIDE 0.9 % IV SOLN: 250.0000 mL | INTRAVENOUS | Status: DC | PRN

## 2020-12-28 MED ORDER — SODIUM CHLORIDE 0.9 % WEIGHT BASED INFUSION
1.0000 mL/kg/h | INTRAVENOUS | Status: DC
Start: 2020-12-29 — End: 2020-12-28

## 2020-12-28 MED ORDER — ACETAMINOPHEN 325 MG PO TABS: 650.0000 mg | ORAL_TABLET | ORAL | Status: DC | PRN

## 2020-12-28 MED ORDER — IOHEXOL 350 MG/ML SOLN
INTRAVENOUS | Status: DC | PRN
  Administered 2020-12-28: 70 mL

## 2020-12-28 MED ORDER — SODIUM CHLORIDE 0.9% FLUSH: 3.0000 mL | INTRAVENOUS | Status: DC | PRN

## 2020-12-28 MED ORDER — FENTANYL CITRATE (PF) 100 MCG/2ML IJ SOLN
INTRAMUSCULAR | Status: DC | PRN
  Administered 2020-12-28: 25 ug via INTRAVENOUS

## 2020-12-28 MED ORDER — ONDANSETRON HCL 4 MG/2ML IJ SOLN: 4.0000 mg | Freq: Four times a day (QID) | INTRAMUSCULAR | Status: DC | PRN

## 2020-12-28 MED ORDER — SODIUM CHLORIDE 0.9 % WEIGHT BASED INFUSION: 3.0000 mL/kg/h | INTRAVENOUS | Status: DC

## 2020-12-28 MED ORDER — ASPIRIN 81 MG PO CHEW
81.0000 mg | CHEWABLE_TABLET | Freq: Every day | ORAL | Status: DC
  Filled 2020-12-28: qty 1

## 2020-12-28 MED ORDER — ASPIRIN 81 MG PO CHEW
81.0000 mg | CHEWABLE_TABLET | ORAL | Status: AC
  Administered 2020-12-28: 81 mg via ORAL
  Filled 2020-12-28: qty 1

## 2020-12-28 MED ORDER — LABETALOL HCL 5 MG/ML IV SOLN: 10.0000 mg | INTRAVENOUS | Status: AC | PRN

## 2020-12-28 MED ORDER — SODIUM CHLORIDE 0.9 % WEIGHT BASED INFUSION
3.0000 mL/kg/h | INTRAVENOUS | Status: DC
  Administered 2020-12-28: 3 mL/kg/h via INTRAVENOUS

## 2020-12-28 MED ORDER — SODIUM CHLORIDE 0.9 % IV SOLN: INTRAVENOUS | Status: DC

## 2020-12-28 MED ORDER — SITAGLIP PHOS-METFORMIN HCL ER 100-1000 MG PO TB24: 1.0000 | ORAL_TABLET | Freq: Every day | ORAL | Status: DC

## 2020-12-28 MED ORDER — HYDRALAZINE HCL 20 MG/ML IJ SOLN: 10.0000 mg | INTRAMUSCULAR | Status: AC | PRN

## 2020-12-28 MED ORDER — ISOSORBIDE MONONITRATE ER 30 MG PO TB24
30.0000 mg | ORAL_TABLET | Freq: Every day | ORAL | Status: DC
  Administered 2020-12-28: 60 mg via ORAL
  Administered 2020-12-28: 30 mg via ORAL
  Filled 2020-12-28: qty 1

## 2020-12-28 MED ORDER — FENTANYL CITRATE (PF) 100 MCG/2ML IJ SOLN
INTRAMUSCULAR | Status: AC
  Filled 2020-12-28: qty 2

## 2020-12-28 MED ORDER — LIDOCAINE HCL (PF) 1 % IJ SOLN
INTRAMUSCULAR | Status: DC | PRN
  Administered 2020-12-28: 15 mL

## 2020-12-28 MED ORDER — LIDOCAINE HCL (PF) 1 % IJ SOLN
INTRAMUSCULAR | Status: AC
  Filled 2020-12-28: qty 30

## 2020-12-28 MED ORDER — MIDAZOLAM HCL 2 MG/2ML IJ SOLN
INTRAMUSCULAR | Status: DC | PRN
  Administered 2020-12-28: 2 mg via INTRAVENOUS

## 2020-12-28 MED ORDER — HEPARIN (PORCINE) IN NACL 1000-0.9 UT/500ML-% IV SOLN
INTRAVENOUS | Status: DC | PRN
  Administered 2020-12-28 (×2): 500 mL

## 2020-12-28 MED ORDER — DIAZEPAM 5 MG PO TABS: 5.0000 mg | ORAL_TABLET | Freq: Four times a day (QID) | ORAL | Status: DC | PRN

## 2020-12-28 MED ORDER — ISOSORBIDE MONONITRATE ER 60 MG PO TB24
60.0000 mg | ORAL_TABLET | Freq: Once | ORAL | Status: AC
  Administered 2020-12-28: 60 mg via ORAL
  Filled 2020-12-28: qty 1

## 2020-12-28 MED ORDER — SODIUM CHLORIDE 0.9 % WEIGHT BASED INFUSION
1.0000 mL/kg/h | INTRAVENOUS | Status: DC
  Administered 2020-12-28: 1 mL/kg/h via INTRAVENOUS

## 2020-12-28 SURGICAL SUPPLY — 13 items
CATH INFINITI 5 FR IM (CATHETERS) ×2 IMPLANT
CATH INFINITI 5FR MULTPACK ANG (CATHETERS) ×2 IMPLANT
GLIDESHEATH SLEND SS 6F .021 (SHEATH) ×2 IMPLANT
GUIDEWIRE INQWIRE 1.5J.035X260 (WIRE) ×1 IMPLANT
INQWIRE 1.5J .035X260CM (WIRE) ×2
KIT HEART LEFT (KITS) ×2 IMPLANT
PACK CARDIAC CATHETERIZATION (CUSTOM PROCEDURE TRAY) ×2 IMPLANT
SHEATH PINNACLE 5F 10CM (SHEATH) ×2 IMPLANT
SYR MEDRAD MARK 7 150ML (SYRINGE) ×2 IMPLANT
TRANSDUCER W/STOPCOCK (MISCELLANEOUS) ×2 IMPLANT
TUBING CIL FLEX 10 FLL-RA (TUBING) ×2 IMPLANT
WIRE EMERALD 3MM-J .035X150CM (WIRE) ×2 IMPLANT
WIRE EMERALD 3MM-J .035X260CM (WIRE) ×2 IMPLANT

## 2020-12-28 NOTE — Progress Notes (Signed)
[facility] for heparin Indication: chest pain/ACS  Allergies  Allergen Reactions  . Demerol Other (See Comments)    hallucinations    Patient Measurements: Height: 5' 7.5" (171.5 cm) Weight: 80.7 kg (178 lb) IBW/kg (Calculated) : 67.25 Heparin Dosing Weight: 80 kg   Vital Signs: BP: 152/87 (03/17 0245) Pulse Rate: 64 (03/17 0245)  Labs: Recent Labs    12/27/20 1451 12/27/20 1651 12/27/20 2013 12/28/20 0132 12/28/20 0230  HGB 12.9*  --   --   --  13.2  HCT 39.9  --   --   --  41.9  PLT 137*  --   --   --  122*  HEPARINUNFRC  --   --   --   --  0.36  CREATININE 1.18  --   --   --   --   TROPONINIHS 7 6 6 10   --     Estimated Creatinine Clearance: 53.9 mL/min (by C-G formula based on SCr of 1.18 mg/dL).   Assessment: 61 YOM who presents with chest pain. Pt has h/o significant CAD that is being medically managed. Pharmacy consulted to start IV heparin.   Heparin level therapeutic (0.36) on gtt at 1000 units/hr. No bleeding noted. Plan for cath today.  Goal of Therapy:  Heparin level 0.3-0.7 units/ml Monitor platelets by anticoagulation protocol: Yes   Plan:  Continue heparin infusion at 1000 units/hr  F/u post cath  Sherlon Handing, PharmD, BCPS Please see amion for complete clinical pharmacist phone list 12/28/2020 3:43 AM

## 2020-12-28 NOTE — Progress Notes (Signed)
Site Area:  RFA Site prior to removal: Level 0 Pressure applied for:  20 minutes Manual: yes Pt status during pull:  stable Post pull site:  Level 0 Post pull instructions given:  yes Post pull pulses present:  yes Dressing applied:  Gauze/tegaderm Bedrest begins @: 13:10 Comments: Removed by Ardelle Park RN

## 2020-12-28 NOTE — Discharge Instructions (Signed)
Call [facility] at [phone] if any bleeding, swelling or drainage at cath site.  May shower, no tub baths for 48 hours for groin sticks. No lifting over 5 pounds for 3 days.  No Driving for 3 days  Heart Healthy Diabetic Diet   Do not take sutagliptin- metformin until 12/31/20 it may interact with cath dye.    Call if any further pain or questions.

## 2020-12-28 NOTE — Interval H&P Note (Signed)
Cath Lab Visit (complete for each Cath Lab visit)  Clinical Evaluation Leading to the Procedure:   ACS: No.  Non-ACS:    Anginal Classification: CCS III  Anti-ischemic medical therapy: Maximal Therapy (2 or more classes of medications)  Non-Invasive Test Results: No non-invasive testing performed  Prior CABG: Cath Lab Visit (complete for each Cath Lab visit)  Clinical Evaluation Leading to the Procedure:   ACS: No.  Non-ACS:    Anginal Classification: CCS III  Anti-ischemic medical therapy: Maximal Therapy (2 or more classes of medications)  Non-Invasive Test Results: No non-invasive testing performed  Prior CABG: Previous CABG      Previous CABG      History and Physical Interval Note:  12/28/2020 11:28 AM  Antonio [name]  has presented today for surgery, with the diagnosis of unstable angina.  The various methods of treatment have been discussed with the patient and family. After consideration of risks, benefits and other options for treatment, the patient has consented to  Procedure(s): LEFT HEART CATH AND CORS/GRAFTS ANGIOGRAPHY (N/A) as a surgical intervention.  The patient's history has been reviewed, patient examined, no change in status, stable for surgery.  I have reviewed the patient's chart and labs.  Questions were answered to the patient's satisfaction.     Shelva Majestic

## 2020-12-28 NOTE — Discharge Summary (Cosign Needed Addendum)
Discharge Summary    Patient ID: Antonio [name] MRN: [id number]; DOB: [birth year]  Admit date: 12/27/2020 Discharge date: 12/28/2020  PCP:  [name], [place]  Cardiologist:  Peter Martinique, MD  Advanced Practice Provider:  No care team member to display Electrophysiologist:  None        Discharge Diagnoses    Principal Problem:   Unstable angina [facility]) Active Problems:   HTN (hypertension)   CAD (coronary artery disease)   HLD (hyperlipidemia)    Diagnostic Studies/Procedures    Cardiac cath: 01/12/2021   Mid LAD lesion is 100% stenosed.  1st Sept lesion is 90% stenosed.  2nd Mrg lesion is 100% stenosed.  Mid RCA to Dist RCA lesion is 100% stenosed.  Prox RCA lesion is 100% stenosed.  RPDA lesion is 100% stenosed.  SVG.  Non-stenotic Origin lesion was previously treated.  SVG.  Mid Graft lesion is 60% stenosed.  Non-stenotic Origin lesion was previously treated.  LIMA and is normal in caliber.  Mid Cx lesion is 100% stenosed.  3rd Mrg lesion is 100% stenosed.  LV end diastolic pressure is normal.  Significant multivessel native CAD with total occlusion of the proximal LAD after a small first diagonal and septal perforating artery; total occlusion of the proximal circumflex; and total occlusion of the mid RCA after the RV marginal branch.  Patent LIMA to LAD  Patent SVG to diagonal vessel with widely patent proximal stent.  Patent sequential vein graft supplying the OM1 and OM 2 vessel.  Patent SVG supplying the mid PDA vessel with widely patent previously placed proximal stent. There is mid graft narrowing in the range of 60%. The distal PDA vessel is now totally occluded at the site of previous 99% stenosis. There are some to the left to right collaterals to the PDA.  RECOMMENDATION: The patient has been on long-term aspirin and prasugrel; continue DAPT. Plan increase medical therapy. Continue  aggressive lipid-lowering therapy and optimal blood pressure control.  _____________   History of Present Illness     Antonio [name] is a 75 y.o. male with known CAD, prior CABG 1996 admitted with unstable angina.  Hx of CABG in 1996, subsequent DES to VG to PDA and DES to SVG to diag 2020.  Other hx includes GERD, HTN, HLD, DM-2.   Pt presented to ER 12/27/20 with lower left thoracic discomfort. Intermittently to left arm.  Waxes and wanes but worse with exertion. He tried NTG with some help.  He is on BB which helped his previous angina.   He remains on effient and ASA.   ECG does show new anterolateral ST-T wave abnormalities in comparison to prior tracing, otherwise old inferior infarct pattern.  Initial high-sensitivity troponin I level was normal.  IV heparin added. Admitted with plans for cardiac cath.   Hospital Course     Consultants: none   Pt did well overnight and troponin remained low at 7,6,6  He underwent cath with stable CAD. See above.  Lipids well controlled continue statin, diabetes is well controlled as well.  He has been seen and evaluated by Dr. Meda Coffee, pt wants to go home and is stable.  Will discharge and have him follow up TOC.  Hold metformin for 48 hours post cath.  Continue effient and asa and BB along with statin.    Did the patient have an acute coronary syndrome (MI, NSTEMI, STEMI, etc) this admission?:  No  Did the patient have a percutaneous coronary intervention (stent / angioplasty)?:  No.       _____________  Discharge Vitals Blood pressure 123/76, pulse 64, temperature 98.2 F (36.8 C), temperature source Oral, resp. rate 15, height 5' 7.5" (1.715 m), weight 80.7 kg, SpO2 99 %.  Filed Weights   12/27/20 1449  Weight: 80.7 kg    Labs & Radiologic Studies    CBC Recent Labs    12/27/20 1451 12/28/20 0230  WBC 5.4 5.4  HGB 12.9* 13.2  HCT 39.9 41.9  MCV 85.3 84.8  PLT 137* 564*   Basic Metabolic  Panel Recent Labs    12/27/20 1451  NA 139  K 3.5  CL 109  CO2 23  GLUCOSE 144*  BUN 13  CREATININE 1.18  CALCIUM 8.8*   Liver Function Tests No results for input(s): AST, ALT, ALKPHOS, BILITOT, PROT, ALBUMIN in the last 72 hours. No results for input(s): LIPASE, AMYLASE in the last 72 hours. High Sensitivity Troponin:   Recent Labs  Lab 12/27/20 1451 12/27/20 1651 12/27/20 2013 12/28/20 0132  TROPONINIHS 7 6 6 10     BNP Invalid input(s): POCBNP D-Dimer No results for input(s): DDIMER in the last 72 hours. Hemoglobin A1C Recent Labs    12/28/20 0230  HGBA1C 7.6*   Fasting Lipid Panel Recent Labs    12/28/20 0230  CHOL 124  HDL 48  LDLCALC 59  TRIG 87  CHOLHDL 2.6   Thyroid Function Tests No results for input(s): TSH, T4TOTAL, T3FREE, THYROIDAB in the last 72 hours.  Invalid input(s): FREET3 _____________  DG Chest 2 View  Result Date: 12/27/2020 CLINICAL DATA:  Chest pain. EXAM: CHEST - 2 VIEW COMPARISON:  July 16, 2020 FINDINGS: Multiple sternal wires and vascular clips are seen. There is no evidence of acute infiltrate, pleural effusion or pneumothorax. The heart size and mediastinal contours are within normal limits. A radiopaque fusion plate is seen overlying the lower cervical spine. Degenerative changes seen throughout the thoracic spine. IMPRESSION: 1. Evidence of prior median sternotomy/CABG. 2. No acute or active cardiopulmonary disease. Electronically Signed   By: Virgina Norfolk M.D.   On: 12/27/2020 15:53   CARDIAC CATHETERIZATION  Result Date: 12/28/2020  Mid LAD lesion is 100% stenosed.  1st Sept lesion is 90% stenosed.  2nd Mrg lesion is 100% stenosed.  Mid RCA to Dist RCA lesion is 100% stenosed.  Prox RCA lesion is 100% stenosed.  RPDA lesion is 100% stenosed.  SVG.  Non-stenotic Origin lesion was previously treated.  SVG.  Mid Graft lesion is 60% stenosed.  Non-stenotic Origin lesion was previously treated.  LIMA and is normal  in caliber.  Mid Cx lesion is 100% stenosed.  3rd Mrg lesion is 100% stenosed.  LV end diastolic pressure is normal.  Significant multivessel native CAD with total occlusion of the proximal LAD after a small first diagonal and septal perforating artery; total occlusion of the proximal circumflex; and total occlusion of the mid RCA after the RV marginal branch. Patent LIMA to LAD Patent SVG to diagonal vessel with widely patent proximal stent. Patent sequential vein graft supplying the OM1 and OM 2 vessel. Patent SVG supplying the mid PDA vessel with widely patent previously placed proximal stent.  There is mid graft narrowing in the range of 60%.  The distal PDA vessel is now totally occluded at the site of previous 99% stenosis.  There are some to the left to right collaterals to the PDA. RECOMMENDATION: The patient has  been on long-term aspirin and prasugrel; continue DAPT.  Plan increase medical therapy.  Continue aggressive lipid-lowering therapy and optimal blood pressure control.   Disposition   Pt is being discharged home today in good condition.  Follow-up Plans & Appointments   Call [facility] at [phone] if any bleeding, swelling or drainage at cath site.  May shower, no tub baths for 48 hours for groin sticks. No lifting over 5 pounds for 3 days.  No Driving for 3 days  Heart Healthy Diabetic Diet   Do not take sutagliptin- metformin until 12/31/20 it may interact with cath dye.    Call if any further pain or questions.     Follow-up Information    Martinique, Peter M, MD Follow up.   Specialty: Cardiology Why: the office should call you tomorrow for appt date and time, you should be seen in next 10 to 14 days.   Contact information: [address] [place] [address] [phone]                Discharge Medications   Allergies as of 12/28/2020      Reactions   Demerol Other (See Comments)   hallucinations      Medication List     TAKE these medications   ANTI ITCH EX Apply 1 application topically daily as needed (itching).   aspirin EC 81 MG tablet Take 81 mg by mouth daily.   Insulin Pen Needle 32G X 4 MM Misc Commonly known as: BD Pen Needle Nano U/F 1 pen by Does not apply route daily.   isosorbide mononitrate 60 MG 24 hr tablet Commonly known as: IMDUR Take 1.5 tablets (90 mg total) by mouth daily.   Januvia 100 MG tablet Generic drug: sitaGLIPtin Take 100 mg by mouth daily.   Levemir FlexTouch 100 UNIT/ML FlexPen Generic drug: insulin detemir Inject 45 Units into the skin daily.   metoprolol succinate 50 MG 24 hr tablet Commonly known as: Toprol XL Take 1 tablet (50 mg total) by mouth daily. Take with or immediately following a meal.   multivitamin with minerals Tabs tablet Take 1 tablet by mouth daily.   nitroGLYCERIN 0.4 MG SL tablet Commonly known as: NITROSTAT PLACE 1 TABLET UNDER THE TONGUE EVERY 5 MINUTES FOR 3 DOSES AS NEEDED FOR CHEST PAIN What changed: See the new instructions.   prasugrel 10 MG Tabs tablet Commonly known as: Effient Take 1 tablet (10 mg total) by mouth daily.   rosuvastatin 40 MG tablet Commonly known as: CRESTOR Take 1 tablet (40 mg total) by mouth daily.   SitaGLIPtin-MetFORMIN HCl [phone] MG Tb24 Take 1 tablet by mouth daily. Start taking on: December 31, 2020 What changed: These instructions start on December 31, 2020. If you are unsure what to do until then, ask your doctor or other care provider.          Outstanding Labs/Studies   BMP  Duration of Discharge Encounter   Greater than 30 minutes including physician time.  Signed, Cecilie Kicks, NP 12/28/2020, 8:10 PM

## 2020-12-28 NOTE — Progress Notes (Signed)
Progress Note  Patient Name: Antonio [name] Date of Encounter: 12/28/2020  [facility] Cardiologist: Peter Martinique, MD   Subjective   Post cath, minimal chest tightness 1/10.  Inpatient Medications    Scheduled Meds: . [START ON 12/29/2020] aspirin  81 mg Oral Daily  . insulin aspart  0-15 Units Subcutaneous TID WC  . metoprolol succinate  50 mg Oral Daily  . multivitamin with minerals  1 tablet Oral Daily  . nitroGLYCERIN  0.4 mg Transdermal Daily  . prasugrel  10 mg Oral Daily  . rosuvastatin  40 mg Oral Daily  . sodium chloride flush  3 mL Intravenous Q12H   Continuous Infusions: . sodium chloride 125 mL/hr at 12/28/20 1457  . sodium chloride     PRN Meds: sodium chloride, acetaminophen, diazepam, hydrALAZINE, labetalol, nitroGLYCERIN, ondansetron (ZOFRAN) IV, sodium chloride flush   Vital Signs    Vitals:   12/28/20 1419 12/28/20 1445 12/28/20 1449 12/28/20 1729  BP: (!) 158/92 (!) 144/87 124/84 123/76  Pulse: 62 (!) 56 (!) 57 64  Resp: 19 15 20 15   Temp: 98.2 F (36.8 C)     TempSrc: Oral     SpO2: 99% 100% 99% 99%  Weight:      Height:        Intake/Output Summary (Last 24 hours) at 12/28/2020 1825 Last data filed at 12/28/2020 1700 Gross per 24 hour  Intake 1051.59 ml  Output 1525 ml  Net -473.41 ml   Last 3 Weights 12/27/2020 09/06/2020 08/08/2020  Weight (lbs) 178 lb 175 lb 175 lb  Weight (kg) 80.74 kg 79.379 kg 79.379 kg      Telemetry    Sinus rhythm in 60s- Personally Reviewed  ECG    No new tracing - Personally Reviewed  Physical Exam   GEN: No acute distress.   Neck: No JVD Cardiac: RRR, no murmurs, rubs, or gallops.  Respiratory: Clear to auscultation bilaterally. GI: Soft, nontender, non-distended  MS: No edema; No deformity. Neuro:  Nonfocal  Psych: Normal affect   Labs    High Sensitivity Troponin:   Recent Labs  Lab 12/27/20 1451 12/27/20 1651 12/27/20 2013 12/28/20 0132  TROPONINIHS 7 6 6 10        Chemistry Recent Labs  Lab 12/27/20 1451  NA 139  K 3.5  CL 109  CO2 23  GLUCOSE 144*  BUN 13  CREATININE 1.18  CALCIUM 8.8*  GFRNONAA >60  ANIONGAP 7     Hematology Recent Labs  Lab 12/27/20 1451 12/28/20 0230  WBC 5.4 5.4  RBC 4.68 4.94  HGB 12.9* 13.2  HCT 39.9 41.9  MCV 85.3 84.8  MCH 27.6 26.7  MCHC 32.3 31.5  RDW 17.8* 17.5*  PLT 137* 122*    BNPNo results for input(s): BNP, PROBNP in the last 168 hours.   DDimer No results for input(s): DDIMER in the last 168 hours.   Radiology    DG Chest 2 View  Result Date: 12/27/2020 CLINICAL DATA:  Chest pain. EXAM: CHEST - 2 VIEW COMPARISON:  July 16, 2020 FINDINGS: Multiple sternal wires and vascular clips are seen. There is no evidence of acute infiltrate, pleural effusion or pneumothorax. The heart size and mediastinal contours are within normal limits. A radiopaque fusion plate is seen overlying the lower cervical spine. Degenerative changes seen throughout the thoracic spine. IMPRESSION: 1. Evidence of prior median sternotomy/CABG. 2. No acute or active cardiopulmonary disease. Electronically Signed   By: Virgina Norfolk M.D.   On:  12/27/2020 15:53   CARDIAC CATHETERIZATION  Result Date: 12/28/2020  Mid LAD lesion is 100% stenosed.  1st Sept lesion is 90% stenosed.  2nd Mrg lesion is 100% stenosed.  Mid RCA to Dist RCA lesion is 100% stenosed.  Prox RCA lesion is 100% stenosed.  RPDA lesion is 100% stenosed.  SVG.  Non-stenotic Origin lesion was previously treated.  SVG.  Mid Graft lesion is 60% stenosed.  Non-stenotic Origin lesion was previously treated.  LIMA and is normal in caliber.  Mid Cx lesion is 100% stenosed.  3rd Mrg lesion is 100% stenosed.  LV end diastolic pressure is normal.  Significant multivessel native CAD with total occlusion of the proximal LAD after a small first diagonal and septal perforating artery; total occlusion of the proximal circumflex; and total occlusion of the mid  RCA after the RV marginal branch. Patent LIMA to LAD Patent SVG to diagonal vessel with widely patent proximal stent. Patent sequential vein graft supplying the OM1 and OM 2 vessel. Patent SVG supplying the mid PDA vessel with widely patent previously placed proximal stent.  There is mid graft narrowing in the range of 60%.  The distal PDA vessel is now totally occluded at the site of previous 99% stenosis.  There are some to the left to right collaterals to the PDA. RECOMMENDATION: The patient has been on long-term aspirin and prasugrel; continue DAPT.  Plan increase medical therapy.  Continue aggressive lipid-lowering therapy and optimal blood pressure control.    Cardiac Studies   Cardiac cath: 01/12/2021   Mid LAD lesion is 100% stenosed.  1st Sept lesion is 90% stenosed.  2nd Mrg lesion is 100% stenosed.  Mid RCA to Dist RCA lesion is 100% stenosed.  Prox RCA lesion is 100% stenosed.  RPDA lesion is 100% stenosed.  SVG.  Non-stenotic Origin lesion was previously treated.  SVG.  Mid Graft lesion is 60% stenosed.  Non-stenotic Origin lesion was previously treated.  LIMA and is normal in caliber.  Mid Cx lesion is 100% stenosed.  3rd Mrg lesion is 100% stenosed.  LV end diastolic pressure is normal.   Significant multivessel native CAD with total occlusion of the proximal LAD after a small first diagonal and septal perforating artery; total occlusion of the proximal circumflex; and total occlusion of the mid RCA after the RV marginal branch.  Patent LIMA to LAD  Patent SVG to diagonal vessel with widely patent proximal stent.  Patent sequential vein graft supplying the OM1 and OM 2 vessel.  Patent SVG supplying the mid PDA vessel with widely patent previously placed proximal stent.  There is mid graft narrowing in the range of 60%.  The distal PDA vessel is now totally occluded at the site of previous 99% stenosis.  There are some to the left to right collaterals to  the PDA.  RECOMMENDATION: The patient has been on long-term aspirin and prasugrel; continue DAPT.  Plan increase medical therapy.  Continue aggressive lipid-lowering therapy and optimal blood pressure control.   Patient Profile     75 y.o. male with known CAD, prior CABG 1996 admitted with unstable angina  Assessment & Plan    CAD, cath today showed severe three-vessel disease, with patent LIMA to LAD, patent SVG to diagonal vessel with widely patent proximal stent and patent sequential vein graft supplying OM1 and OM 2 vessels.  Patent SVG supplying mid PDA with widely patent previously placed proximal stent.  The patient can be discharged today, he is currently having minimal chest  pressure 1 out of 10, we will continue long-term aspirin and prasugrel for at least a year ideally longer, continue rosuvastatin 40 mg daily, metoprolol succinate 50 mg daily, I will add Imdur 30 mg to his regimen.  We will arrange for follow-up in the clinic.  His current access site has no bruising or bleeding, and he has good peripheral pulses in his right lower extremity.  He will need tighter diabetic control.  Hemoglobin A1c 7.6%.  For questions or updates, please contact [place] Please consult [email or web address] for contact info under        Signed, Ena Dawley, MD  12/28/2020, 6:25 PM

## 2020-12-29 ENCOUNTER — Encounter (HOSPITAL_COMMUNITY): Payer: Self-pay | Admitting: Cardiovascular Disease

## 2021-01-10 NOTE — Progress Notes (Deleted)
Cardiology Office Note   Date:  01/10/2021   ID:  Antonio [name], DOB 1946-08-28, MRN [id number]  PCP:  [name], MD  Cardiologist:  Dr. Martinique  CC: [facility] [place] follow up   History of Present Illness: Antonio [name] is a 75 y.o. male who presents for posthospitalization follow-up after being admitted on 12/27/2020 with unstable angina requiring cardiac catheterization.  He has a known history of CAD with prior CABG in 1996 (DES to VG to PDA and DES to SVG to diagonal in 2020); additional history of GERD, hypertension, hyperlipidemia, diabetes mellitus type 2.  The patient presented to the ER on 12/27/2020 with lower left thoracic discomfort intermittent with radiation to the left arm coming in waves and worse with exertion.  EKG was abnormal showing new anterior lateral ST-T wave abnormalities compared to prior tracing.  Patient underwent cardiac catheterization on 3/17//2022.  This revealed significant multivessel native CAD with total occlusion of the proximal LAD after small first diagonal and septal perforating artery with total occlusion of the proximal circumflex and total occlusion of the mid RCA after the RV marginal branch.  It also revealed a patent LIMA to LAD, patent SVG to diagonal vessel with widely patent proximal stent.  Patent sequential vein graft supplying OM1 and OM 2 vessel.  Patent SVG supplying the mid PDA vessel with widely patent previously placed proximal stent.  There was a mid graft narrowing in the range of 60%.  The distal PDA vessel was totally occluded at the site of her previous 99% stenosis.  There were some left to right collaterals to the PDA.  The patient was recommended for medical therapy with aspirin, Prasugrel and to continue dual antiplatelet therapy.  He was to be placed on aggressive lipid-lowering therapy and to optimize blood pressure control.  Better control of diabetes was also recommended.   Past Medical History:  Diagnosis Date  . Alcohol  abuse, in remission   . Arthritis   . Bleeding per rectum   . BPH (benign prostatic hyperplasia)   . CAD (coronary artery disease)    a. s/p CABG 1996  b. LHC in 11/2013 w/ patent grafts. c. 12/19/14 re-look cath with patent grafts and good LVF  . Erectile dysfunction   . GERD (gastroesophageal reflux disease)   . History of lower GI bleeding   . HLD (hyperlipidemia)   . HTN (hypertension)   . Prostate cancer ([place])    a. s/p radiation   . S/P angioplasty with stent-DES ostial VG to PDA and DES ostial VG to diag. 08/27/19 08/28/2019  . Type II diabetes mellitus ([place])     Past Surgical History:  Procedure Laterality Date  . CARDIAC CATHETERIZATION  01/27/2009   ef 60%  . CARDIAC CATHETERIZATION  08/25/2012   Severe 3v obstructive CAD, continued graft patency (SVG-D,  SVG-OM1-OM2, SVG-PDA, LIMA-LAD). area of diffuse dz in the distal LCx (up to 90%) unamenable to PCI  . CERVICAL DISC SURGERY  ? 1990's   "went in on the side" (09/03/2012)  . CORONARY ARTERY BYPASS GRAFT  1996   LIMA GRAFT TO THE LAD, SAPHENOUS VEIN GRAFT SEQUENTIALLY TO THE FIRST AND SECOND OBTUSE MARGINAL VESSELS, SAPHENOUS VEIN GRAFT TO THE DIAGONAL, AND SAPHENOUS VEIN GRAFT TO THE DISTAL RIGHT CORONARY   . CORONARY STENT INTERVENTION N/A 08/27/2019   Procedure: CORONARY STENT INTERVENTION;  Surgeon: Martinique, Peter M, MD;  Location: [place] [facility];  Service: Cardiovascular;  Laterality: N/A;  . ESOPHAGOGASTRODUODENOSCOPY Left 11/19/2013  Procedure: ESOPHAGOGASTRODUODENOSCOPY (EGD);  Surgeon: Arta Silence, MD;  Location: [facility] ENDOSCOPY;  Service: Endoscopy;  Laterality: Left;  . FOOT SURGERY  1980's   LEFT, "shot a nail gun thru it" (09/03/2012)  . LACERATION REPAIR  1980's   LEFT HAND  . LEFT HEART CATH AND CORS/GRAFTS ANGIOGRAPHY N/A 08/26/2019   Procedure: LEFT HEART CATH AND CORS/GRAFTS ANGIOGRAPHY;  Surgeon: Martinique, Peter M, MD;  Location: [place] [facility];  Service: Cardiovascular;  Laterality: N/A;  . LEFT  HEART CATH AND CORS/GRAFTS ANGIOGRAPHY N/A 08/08/2020   Procedure: LEFT HEART CATH AND CORS/GRAFTS ANGIOGRAPHY;  Surgeon: Belva Crome, MD;  Location: [place] [facility];  Service: Cardiovascular;  Laterality: N/A;  . LEFT HEART CATH AND CORS/GRAFTS ANGIOGRAPHY N/A 12/28/2020   Procedure: LEFT HEART CATH AND CORS/GRAFTS ANGIOGRAPHY;  Surgeon: Troy Sine, MD;  Location: [place] [facility];  Service: Cardiovascular;  Laterality: N/A;  . LEFT HEART CATHETERIZATION WITH CORONARY ANGIOGRAM N/A 09/04/2012   Procedure: LEFT HEART CATHETERIZATION WITH CORONARY ANGIOGRAM;  Surgeon: Peter M Martinique, MD;  Location: [facility] CATH LAB;  Service: Cardiovascular;  Laterality: N/A;  . LEFT HEART CATHETERIZATION WITH CORONARY/GRAFT ANGIOGRAM N/A 11/22/2013   Procedure: LEFT HEART CATHETERIZATION WITH Beatrix Fetters;  Surgeon: Jettie Booze, MD;  Location: [facility] CATH LAB;  Service: Cardiovascular;  Laterality: N/A;  . LEFT HEART CATHETERIZATION WITH CORONARY/GRAFT ANGIOGRAM N/A 12/19/2014   Procedure: LEFT HEART CATHETERIZATION WITH Beatrix Fetters;  Surgeon: Peter M Martinique, MD;  Location: [facility] CATH LAB;  Service: Cardiovascular;  Laterality: N/A;     Current Outpatient Medications  Medication Sig Dispense Refill  . aspirin EC 81 MG tablet Take 81 mg by mouth daily.    . Insulin Pen Needle (BD PEN NEEDLE NANO U/F) 32G X 4 MM MISC 1 pen by Does not apply route daily. 30 each 11  . isosorbide mononitrate (IMDUR) 60 MG 24 hr tablet Take 1.5 tablets (90 mg total) by mouth daily. 135 tablet 3  . JANUVIA 100 MG tablet Take 100 mg by mouth daily.    Antonio [name] LEVEMIR FLEXTOUCH 100 UNIT/ML Pen Inject 45 Units into the skin daily.   0  . metoprolol succinate (TOPROL XL) 50 MG 24 hr tablet Take 1 tablet (50 mg total) by mouth daily. Take with or immediately following a meal. 30 tablet 11  . Multiple Vitamin (MULTIVITAMIN WITH MINERALS) TABS Take 1 tablet by mouth daily.    . nitroGLYCERIN (NITROSTAT) 0.4 MG SL  tablet PLACE 1 TABLET UNDER THE TONGUE EVERY 5 MINUTES FOR 3 DOSES AS NEEDED FOR CHEST PAIN (Patient taking differently: Place 0.4 mg under the tongue every 5 (five) minutes as needed.) 25 tablet 3  . Pramoxine-Camphor-Zinc Acetate (ANTI ITCH EX) Apply 1 application topically daily as needed (itching).     . prasugrel (EFFIENT) 10 MG TABS tablet Take 1 tablet (10 mg total) by mouth daily. 90 tablet 3  . rosuvastatin (CRESTOR) 40 MG tablet Take 1 tablet (40 mg total) by mouth daily. 90 tablet 3  . SitaGLIPtin-MetFORMIN HCl [id number] MG TB24 Take 1 tablet by mouth daily. 30 tablet    Current Facility-Administered Medications  Medication Dose Route Frequency Provider Last Rate Last Admin  . sodium chloride flush (NS) 0.9 % injection 3 mL  3 mL Intravenous Q12H Martinique, Peter M, MD        Allergies:   Demerol    Social History:  The patient  reports that he quit smoking about 18 years ago. His smoking use  included cigarettes. He has a 37.00 pack-year smoking history. He has never used smokeless tobacco. He reports current alcohol use. He reports that he does not use drugs.   Family History:  The patient's family history includes CAD in his brother; Cancer in his mother and other family members; Diabetes in an other family member; Hypertension in his father, mother, and another family member.    ROS: All other systems are reviewed and negative. Unless otherwise mentioned in H&P    PHYSICAL EXAM: VS:  There were no vitals taken for this visit. , BMI There is no height or weight on file to calculate BMI. GEN: Well nourished, well developed, in no acute distress HEENT: normal Neck: no JVD, carotid bruits, or masses Cardiac: ***RRR; no murmurs, rubs, or gallops,no edema  Respiratory:  Clear to auscultation bilaterally, normal work of breathing GI: soft, nontender, nondistended, + BS MS: no deformity or atrophy Skin: warm and dry, no rash Neuro:  Strength and sensation are intact Psych: euthymic  mood, full affect   EKG:  EKG {ACTION; IS/IS [id number]} ordered today. The ekg ordered today demonstrates ***   Recent Labs: 07/16/2020: Magnesium 1.9; TSH 0.457 07/17/2020: ALT 20 12/27/2020: BUN 13; Creatinine, Ser 1.18; Potassium 3.5; Sodium 139 12/28/2020: Hemoglobin 13.2; Platelets 122    Lipid Panel    Component Value Date/Time   CHOL 124 12/28/2020 0230   CHOL 124 08/17/2019 1109   TRIG 87 12/28/2020 0230   HDL 48 12/28/2020 0230   HDL 41 08/17/2019 1109   CHOLHDL 2.6 12/28/2020 0230   VLDL 17 12/28/2020 0230   LDLCALC 59 12/28/2020 0230   LDLCALC 68 08/17/2019 1109      Wt Readings from Last 3 Encounters:  12/27/20 178 lb (80.7 kg)  09/06/20 175 lb (79.4 kg)  08/08/20 175 lb (79.4 kg)      Other studies Reviewed:  Mid LAD lesion is 100% stenosed.  1st Sept lesion is 90% stenosed.  2nd Mrg lesion is 100% stenosed.  Mid RCA to Dist RCA lesion is 100% stenosed.  Prox RCA lesion is 100% stenosed.  RPDA lesion is 100% stenosed.  SVG.  Non-stenotic Origin lesion was previously treated.  SVG.  Mid Graft lesion is 60% stenosed.  Non-stenotic Origin lesion was previously treated.  LIMA and is normal in caliber.  Mid Cx lesion is 100% stenosed.  3rd Mrg lesion is 100% stenosed.  LV end diastolic pressure is normal.   Significant multivessel native CAD with total occlusion of the proximal LAD after a small first diagonal and septal perforating artery; total occlusion of the proximal circumflex; and total occlusion of the mid RCA after the RV marginal branch.  Patent LIMA to LAD  Patent SVG to diagonal vessel with widely patent proximal stent.  Patent sequential vein graft supplying the OM1 and OM 2 vessel.  Patent SVG supplying the mid PDA vessel with widely patent previously placed proximal stent.  There is mid graft narrowing in the range of 60%.  The distal PDA vessel is now totally occluded at the site of previous 99% stenosis.  There  are some to the left to right collaterals to the PDA.  RECOMMENDATION: The patient has been on long-term aspirin and prasugrel; continue DAPT.  Plan increase medical therapy.  Continue aggressive lipid-lowering therapy and optimal blood pressure control.   Recommendations  Antiplatelet/Anticoag Patient has been on long-term aspirin and prasugrel..    ASSESSMENT AND PLAN:  1.  ***   Current medicines are reviewed at  length with the patient today.  I have spent *** dedicated to the care of this patient on the date of this encounter to include pre-visit review of records, assessment, management and diagnostic testing,with shared decision making.  Labs/ tests ordered today include: *** Phill Myron. [place] Pugh, ANP, AACC   01/10/2021 [place] [name] Office [phone] Fax [phone]  Notice: This dictation was prepared with Dragon dictation along with smaller phrase technology. Any transcriptional errors that result from this process are unintentional and may not be corrected upon review.

## 2021-01-12 ENCOUNTER — Ambulatory Visit: Payer: Medicare HMO | Admitting: Adult Health

## 2021-01-31 ENCOUNTER — Other Ambulatory Visit: Payer: Self-pay | Admitting: Cardiology

## 2021-02-19 ENCOUNTER — Other Ambulatory Visit: Payer: Self-pay

## 2021-02-19 ENCOUNTER — Encounter: Payer: Self-pay | Admitting: Physician Assistant

## 2021-02-19 ENCOUNTER — Ambulatory Visit (INDEPENDENT_AMBULATORY_CARE_PROVIDER_SITE_OTHER): Payer: Medicare HMO | Admitting: Physician Assistant

## 2021-02-19 VITALS — BP 122/76 | HR 70 | Ht 67.5 in | Wt 177.4 lb

## 2021-02-19 DIAGNOSIS — I25118 Atherosclerotic heart disease of native coronary artery with other forms of angina pectoris: Secondary | ICD-10-CM | POA: Diagnosis not present

## 2021-02-19 DIAGNOSIS — Z951 Presence of aortocoronary bypass graft: Secondary | ICD-10-CM | POA: Diagnosis not present

## 2021-02-19 DIAGNOSIS — E785 Hyperlipidemia, unspecified: Secondary | ICD-10-CM

## 2021-02-19 DIAGNOSIS — I10 Essential (primary) hypertension: Secondary | ICD-10-CM | POA: Diagnosis not present

## 2021-02-19 MED ORDER — METOPROLOL SUCCINATE ER 50 MG PO TB24: 50.0000 mg | ORAL_TABLET | Freq: Every day | ORAL | 3 refills | Status: DC

## 2021-02-19 NOTE — Progress Notes (Signed)
Cardiology Office Note:    Date:  02/19/2021   ID:  Antonio [name], DOB 04-12-1946, MRN [id number]  PCP:  [name], [place]  Cardiologist:  Peter Martinique, MD  Referring MD: [name], MD   Reason for visit: Hospital follow-up  History of Present Illness:    Antonio [name] is a 75 y.o. male with a hx of known CAD, prior CABG 1996 admitted with unstable angina.  Hx of CABG in 1996, subsequent DES to VG to PDA and DES to SVG to diag 2020.  Other hx includes GERD, HTN, HLD, DM-2.   Pt presented to ER 12/27/20 with lower left thoracic discomfort. Intermittently to left arm. Waxes and wanes but worse with exertion. He tried NTG with some help.   He had LHC 01/12/21 showing: - Significant multivessel native CAD with total occlusion of the proximal LAD after a small first diagonal and septal perforating artery; total occlusion of the proximal circumflex; and total occlusion of the mid RCA after the RV marginal branch. - Patent LIMA to LAD - Patent SVG to diagonal vessel with widely patent proximal stent. - Patent sequential vein graft supplying the OM1 and OM 2 vessel. - Patent SVG supplying the mid PDA vessel with widely patent previously placed proximal stent. There is mid graft narrowing in the range of 60%. The distal PDA vessel is now totally occluded at the site of previous 99% stenosis. There are some to the left to right collaterals to the PDA.  Today, he comes to clinic alone.  He feels well.  He still feels some chest achiness/pain after walking 1/4 mile, resting relieves and he is able to continue walking.  He does feel like it's improved since [facility] 01/12/21. He has not used SL NTG since LHC.  He denies shortness of breath, syncope, orthopnea, PND or significant pedal edema.  No bleeding with Effient/ASA.   Past Medical History:  Diagnosis Date  . Alcohol abuse, in remission   . Arthritis   . Bleeding per rectum   . BPH (benign  prostatic hyperplasia)   . CAD (coronary artery disease)    a. s/p CABG 1996  b. LHC in 11/2013 w/ patent grafts. c. 12/19/14 re-look cath with patent grafts and good LVF  . Erectile dysfunction   . GERD (gastroesophageal reflux disease)   . History of lower GI bleeding   . HLD (hyperlipidemia)   . HTN (hypertension)   . Prostate cancer ([place])    a. s/p radiation   . S/P angioplasty with stent-DES ostial VG to PDA and DES ostial VG to diag. 08/27/19 08/28/2019  . Type II diabetes mellitus ([place])     Past Surgical History:  Procedure Laterality Date  . CARDIAC CATHETERIZATION  01/27/2009   ef 60%  . CARDIAC CATHETERIZATION  08/25/2012   Severe 3v obstructive CAD, continued graft patency (SVG-D,  SVG-OM1-OM2, SVG-PDA, LIMA-LAD). area of diffuse dz in the distal LCx (up to 90%) unamenable to PCI  . CERVICAL DISC SURGERY  ? 1990's   "went in on the side" (09/03/2012)  . CORONARY ARTERY BYPASS GRAFT  1996   LIMA GRAFT TO THE LAD, SAPHENOUS VEIN GRAFT SEQUENTIALLY TO THE FIRST AND SECOND OBTUSE MARGINAL VESSELS, SAPHENOUS VEIN GRAFT TO THE DIAGONAL, AND SAPHENOUS VEIN GRAFT TO THE DISTAL RIGHT CORONARY   . CORONARY STENT INTERVENTION N/A 08/27/2019   Procedure: CORONARY STENT INTERVENTION;  Surgeon: Martinique, Peter M, MD;  Location: [place] [facility];  Service: Cardiovascular;  Laterality: N/A;  . ESOPHAGOGASTRODUODENOSCOPY Left 11/19/2013   Procedure: ESOPHAGOGASTRODUODENOSCOPY (EGD);  Surgeon: Arta Silence, MD;  Location: [facility] ENDOSCOPY;  Service: Endoscopy;  Laterality: Left;  . FOOT SURGERY  1980's   LEFT, "shot a nail gun thru it" (09/03/2012)  . LACERATION REPAIR  1980's   LEFT HAND  . LEFT HEART CATH AND CORS/GRAFTS ANGIOGRAPHY N/A 08/26/2019   Procedure: LEFT HEART CATH AND CORS/GRAFTS ANGIOGRAPHY;  Surgeon: Martinique, Peter M, MD;  Location: [place] [facility];  Service: Cardiovascular;  Laterality: N/A;  . LEFT HEART CATH AND CORS/GRAFTS ANGIOGRAPHY N/A 08/08/2020   Procedure: LEFT HEART  CATH AND CORS/GRAFTS ANGIOGRAPHY;  Surgeon: Belva Crome, MD;  Location: [place] [facility];  Service: Cardiovascular;  Laterality: N/A;  . LEFT HEART CATH AND CORS/GRAFTS ANGIOGRAPHY N/A 12/28/2020   Procedure: LEFT HEART CATH AND CORS/GRAFTS ANGIOGRAPHY;  Surgeon: Troy Sine, MD;  Location: [place] [facility];  Service: Cardiovascular;  Laterality: N/A;  . LEFT HEART CATHETERIZATION WITH CORONARY ANGIOGRAM N/A 09/04/2012   Procedure: LEFT HEART CATHETERIZATION WITH CORONARY ANGIOGRAM;  Surgeon: Peter M Martinique, MD;  Location: [facility] CATH LAB;  Service: Cardiovascular;  Laterality: N/A;  . LEFT HEART CATHETERIZATION WITH CORONARY/GRAFT ANGIOGRAM N/A 11/22/2013   Procedure: LEFT HEART CATHETERIZATION WITH Beatrix Fetters;  Surgeon: Jettie Booze, MD;  Location: [facility] CATH LAB;  Service: Cardiovascular;  Laterality: N/A;  . LEFT HEART CATHETERIZATION WITH CORONARY/GRAFT ANGIOGRAM N/A 12/19/2014   Procedure: LEFT HEART CATHETERIZATION WITH Beatrix Fetters;  Surgeon: Peter M Martinique, MD;  Location: [facility] CATH LAB;  Service: Cardiovascular;  Laterality: N/A;    Current Medications: Current Meds  Medication Sig  . aspirin EC 81 MG tablet Take 81 mg by mouth daily.  . Insulin Pen Needle (BD PEN NEEDLE NANO U/F) 32G X 4 MM MISC 1 pen by Does not apply route daily.  . isosorbide mononitrate (IMDUR) 60 MG 24 hr tablet TAKE 1 & 1/2 TABLETS (90 MG TOTAL) BY MOUTH DAILY.  Marland Kitchen JANUVIA 100 MG tablet Take 100 mg by mouth daily.  Marland Kitchen LEVEMIR FLEXTOUCH 100 UNIT/ML Pen Inject 45 Units into the skin daily.   . metFORMIN (GLUCOPHAGE) 1000 MG tablet Take 1 tablet by mouth 2 (two) times daily.  . metoprolol succinate (TOPROL XL) 50 MG 24 hr tablet Take 1 tablet (50 mg total) by mouth daily. Take with or immediately following a meal.  . Multiple Vitamin (MULTIVITAMIN WITH MINERALS) TABS Take 1 tablet by mouth daily.  . nitroGLYCERIN (NITROSTAT) 0.4 MG SL tablet PLACE 1 TABLET UNDER THE TONGUE EVERY 5  MINUTES FOR 3 DOSES AS NEEDED FOR CHEST PAIN (Patient taking differently: Place 0.4 mg under the tongue every 5 (five) minutes as needed.)  . Pramoxine-Camphor-Zinc Acetate (ANTI ITCH EX) Apply 1 application topically daily as needed (itching).   . prasugrel (EFFIENT) 10 MG TABS tablet Take 1 tablet (10 mg total) by mouth daily.  . rosuvastatin (CRESTOR) 40 MG tablet Take 1 tablet (40 mg total) by mouth daily.   Current Facility-Administered Medications for the 02/19/21 encounter (Office Visit) with Almyra Deforest, PA  Medication  . sodium chloride flush (NS) 0.9 % injection 3 mL     Allergies:   Demerol   Social History   Socioeconomic History  . Marital status: Married    Spouse name: Not on file  . Number of children: 1  . Years of education: Not on file  . Highest education level: Not on file  Occupational History  .  Occupation: English as a second language teacher: [employer]    Comment: works 3rd shift  Tobacco Use  . Smoking status: Former Smoker    Packs/day: 1.00    Years: 37.00    Pack years: 37.00    Types: Cigarettes    Quit date: 10/14/2002    Years since quitting: 18.3  . Smokeless tobacco: Never Used  Vaping Use  . Vaping Use: Never used  Substance and Sexual Activity  . Alcohol use: Yes    Alcohol/week: 0.0 standard drinks    Comment: 6 pack beer per week  . Drug use: No  . Sexual activity: Yes  Other Topics Concern  . Not on file  Social History Narrative   Regular exercise-yes   Social Determinants of Health   Financial Resource Strain: Not on file  Food Insecurity: Not on file  Transportation Needs: Not on file  Physical Activity: Not on file  Stress: Not on file  Social Connections: Not on file     Family History: The patient's family history includes CAD in his brother; Cancer in his mother and other family members; Diabetes in an other family member; Hypertension in his father, mother, and another family member.  ROS:   Please see the history of present illness.      EKGs/Labs/Other Studies Reviewed:    EKG:  EKG is not ordered today.   Recent Labs: 07/16/2020: Magnesium 1.9; TSH 0.457 07/17/2020: ALT 20 12/27/2020: BUN 13; Creatinine, Ser 1.18; Potassium 3.5; Sodium 139 12/28/2020: Hemoglobin 13.2; Platelets 122  Recent Lipid Panel    Component Value Date/Time   CHOL 124 12/28/2020 0230   CHOL 124 08/17/2019 1109   TRIG 87 12/28/2020 0230   HDL 48 12/28/2020 0230   HDL 41 08/17/2019 1109   CHOLHDL 2.6 12/28/2020 0230   VLDL 17 12/28/2020 0230   LDLCALC 59 12/28/2020 0230   LDLCALC 68 08/17/2019 1109     Risk Assessment/Calculations:       Physical Exam:    VS:  BP 122/76   Pulse 70   Ht 5' 7.5" (1.715 m)   Wt 177 lb 6.4 oz (80.5 kg)   SpO2 96%   BMI 27.37 kg/m     Wt Readings from Last 3 Encounters:  02/19/21 177 lb 6.4 oz (80.5 kg)  12/27/20 178 lb (80.7 kg)  09/06/20 175 lb (79.4 kg)     GEN: Well nourished, well developed in no acute distress HEENT: Normal NECK: No JVD; No carotid bruits LYMPHATICS: No lymphadenopathy CARDIAC: RRR, no murmurs, rubs, gallops RESPIRATORY:  Clear to auscultation without rales, wheezing or rhonchi  ABDOMEN: Soft, non-tender, non-distended MUSCULOSKELETAL:  No edema; No deformity  SKIN: Warm and dry NEUROLOGIC:  Alert and oriented x 3 PSYCHIATRIC:  Normal affect   ASSESSMENT AND PLAN   1.  Coronary artery disease s/p CABG 1996 with stable angina - S/p POBA of SVG to RCA 1997.  PCI with stenting to the ostial SVG to diagonal and SVG to PDA in November 2020. --> Reviewed LHC images with him. --> C/w Toprol 50mg  & Imdur 90mg  with anti-anginals.  I explained to him that if he has recurrent angina, we do have room to titrate these medications.   --> C/w long-term Effient/ASA given complex CAD.   --> C/w high dose statin.   --> C/w exercise as tolerated.  2.  Hypertension, well controlled.   - C/w current medications.  3.  Hyperlipidemia, LDL <70, well controlled. - C/w Crestor  40mg  daily.  4. NIDDM  - Goal A1C ~7%, currently 7.6% on 12/2020. - F/U with PCP   Medication Adjustments/Labs and Tests Ordered: Current medicines are reviewed at length with the patient today.  Concerns regarding medicines are outlined above.  No orders of the defined types were placed in this encounter.  No orders of the defined types were placed in this encounter.   There are no Patient Instructions on file for this visit.   Signed, Warren Lacy, PA-C  02/19/2021 12:09 PM    [facility]

## 2021-02-19 NOTE — Patient Instructions (Signed)

## 2021-03-14 ENCOUNTER — Ambulatory Visit: Payer: Medicare HMO | Admitting: Cardiology

## 2021-04-27 ENCOUNTER — Other Ambulatory Visit: Payer: Self-pay | Admitting: Cardiology

## 2021-07-13 ENCOUNTER — Inpatient Hospital Stay (HOSPITAL_COMMUNITY)
Admission: EM | Admit: 2021-07-13 | Discharge: 2021-07-22 | DRG: 717 | Disposition: A | Discharge status: Home / Self Care | Payer: Medicare Other | Attending: Family Medicine | Admitting: Family Medicine

## 2021-07-13 ENCOUNTER — Ambulatory Visit (HOSPITAL_COMMUNITY)
Admission: EM | Admit: 2021-07-13 | Discharge: 2021-07-13 | Disposition: A | Discharge status: Home / Self Care | Payer: Medicare Other

## 2021-07-13 ENCOUNTER — Other Ambulatory Visit: Payer: Self-pay

## 2021-07-13 ENCOUNTER — Emergency Department (HOSPITAL_COMMUNITY): Payer: Medicare Other

## 2021-07-13 DIAGNOSIS — I25119 Atherosclerotic heart disease of native coronary artery with unspecified angina pectoris: Secondary | ICD-10-CM | POA: Diagnosis present

## 2021-07-13 DIAGNOSIS — A419 Sepsis, unspecified organism: Secondary | ICD-10-CM | POA: Diagnosis present

## 2021-07-13 DIAGNOSIS — Z794 Long term (current) use of insulin: Secondary | ICD-10-CM | POA: Diagnosis not present

## 2021-07-13 DIAGNOSIS — R339 Retention of urine, unspecified: Secondary | ICD-10-CM | POA: Diagnosis not present

## 2021-07-13 DIAGNOSIS — Z955 Presence of coronary angioplasty implant and graft: Secondary | ICD-10-CM

## 2021-07-13 DIAGNOSIS — I248 Other forms of acute ischemic heart disease: Secondary | ICD-10-CM | POA: Diagnosis present

## 2021-07-13 DIAGNOSIS — Y738 Miscellaneous gastroenterology and urology devices associated with adverse incidents, not elsewhere classified: Secondary | ICD-10-CM | POA: Diagnosis present

## 2021-07-13 DIAGNOSIS — K219 Gastro-esophageal reflux disease without esophagitis: Secondary | ICD-10-CM | POA: Diagnosis present

## 2021-07-13 DIAGNOSIS — Z79899 Other long term (current) drug therapy: Secondary | ICD-10-CM | POA: Diagnosis not present

## 2021-07-13 DIAGNOSIS — R079 Chest pain, unspecified: Secondary | ICD-10-CM | POA: Diagnosis not present

## 2021-07-13 DIAGNOSIS — D500 Iron deficiency anemia secondary to blood loss (chronic): Secondary | ICD-10-CM | POA: Diagnosis present

## 2021-07-13 DIAGNOSIS — Z7982 Long term (current) use of aspirin: Secondary | ICD-10-CM

## 2021-07-13 DIAGNOSIS — I10 Essential (primary) hypertension: Secondary | ICD-10-CM | POA: Diagnosis present

## 2021-07-13 DIAGNOSIS — E1122 Type 2 diabetes mellitus with diabetic chronic kidney disease: Secondary | ICD-10-CM | POA: Diagnosis not present

## 2021-07-13 DIAGNOSIS — R319 Hematuria, unspecified: Secondary | ICD-10-CM

## 2021-07-13 DIAGNOSIS — T839XXA Unspecified complication of genitourinary prosthetic device, implant and graft, initial encounter: Secondary | ICD-10-CM

## 2021-07-13 DIAGNOSIS — N421 Congestion and hemorrhage of prostate: Secondary | ICD-10-CM | POA: Diagnosis present

## 2021-07-13 DIAGNOSIS — Y842 Radiological procedure and radiotherapy as the cause of abnormal reaction of the patient, or of later complication, without mention of misadventure at the time of the procedure: Secondary | ICD-10-CM | POA: Diagnosis present

## 2021-07-13 DIAGNOSIS — T83091A Other mechanical complication of indwelling urethral catheter, initial encounter: Secondary | ICD-10-CM | POA: Diagnosis present

## 2021-07-13 DIAGNOSIS — Z8546 Personal history of malignant neoplasm of prostate: Secondary | ICD-10-CM

## 2021-07-13 DIAGNOSIS — R310 Gross hematuria: Secondary | ICD-10-CM | POA: Diagnosis not present

## 2021-07-13 DIAGNOSIS — N179 Acute kidney failure, unspecified: Secondary | ICD-10-CM | POA: Diagnosis present

## 2021-07-13 DIAGNOSIS — Z801 Family history of malignant neoplasm of trachea, bronchus and lung: Secondary | ICD-10-CM

## 2021-07-13 DIAGNOSIS — E119 Type 2 diabetes mellitus without complications: Secondary | ICD-10-CM | POA: Diagnosis present

## 2021-07-13 DIAGNOSIS — Z833 Family history of diabetes mellitus: Secondary | ICD-10-CM

## 2021-07-13 DIAGNOSIS — D696 Thrombocytopenia, unspecified: Secondary | ICD-10-CM | POA: Diagnosis present

## 2021-07-13 DIAGNOSIS — Z923 Personal history of irradiation: Secondary | ICD-10-CM | POA: Diagnosis not present

## 2021-07-13 DIAGNOSIS — Z8249 Family history of ischemic heart disease and other diseases of the circulatory system: Secondary | ICD-10-CM | POA: Diagnosis not present

## 2021-07-13 DIAGNOSIS — Z8042 Family history of malignant neoplasm of prostate: Secondary | ICD-10-CM

## 2021-07-13 DIAGNOSIS — Z87891 Personal history of nicotine dependence: Secondary | ICD-10-CM

## 2021-07-13 DIAGNOSIS — Z20822 Contact with and (suspected) exposure to covid-19: Secondary | ICD-10-CM | POA: Diagnosis present

## 2021-07-13 DIAGNOSIS — N182 Chronic kidney disease, stage 2 (mild): Secondary | ICD-10-CM | POA: Diagnosis not present

## 2021-07-13 DIAGNOSIS — Z885 Allergy status to narcotic agent status: Secondary | ICD-10-CM

## 2021-07-13 DIAGNOSIS — N400 Enlarged prostate without lower urinary tract symptoms: Secondary | ICD-10-CM | POA: Diagnosis present

## 2021-07-13 DIAGNOSIS — Z951 Presence of aortocoronary bypass graft: Secondary | ICD-10-CM

## 2021-07-13 DIAGNOSIS — E785 Hyperlipidemia, unspecified: Secondary | ICD-10-CM | POA: Diagnosis present

## 2021-07-13 DIAGNOSIS — Z7984 Long term (current) use of oral hypoglycemic drugs: Secondary | ICD-10-CM

## 2021-07-13 DIAGNOSIS — I2510 Atherosclerotic heart disease of native coronary artery without angina pectoris: Secondary | ICD-10-CM | POA: Diagnosis present

## 2021-07-13 DIAGNOSIS — I25708 Atherosclerosis of coronary artery bypass graft(s), unspecified, with other forms of angina pectoris: Secondary | ICD-10-CM | POA: Diagnosis not present

## 2021-07-13 LAB — COMPREHENSIVE METABOLIC PANEL
ALT: 15 U/L (ref 0–44)
AST: 19 U/L (ref 15–41)
Albumin: 3.5 g/dL (ref 3.5–5.0)
Alkaline Phosphatase: 51 U/L (ref 38–126)
Anion gap: 9 (ref 5–15)
BUN: 16 mg/dL (ref 8–23)
CO2: 21 mmol/L — ABNORMAL LOW (ref 22–32)
Calcium: 8.8 mg/dL — ABNORMAL LOW (ref 8.9–10.3)
Chloride: 107 mmol/L (ref 98–111)
Creatinine, Ser: 1.54 mg/dL — ABNORMAL HIGH (ref 0.61–1.24)
GFR, Estimated: 48 mL/min — ABNORMAL LOW (ref >60)
Glucose, Bld: 188 mg/dL — ABNORMAL HIGH (ref 70–99)
Potassium: 3.8 mmol/L (ref 3.5–5.1)
Sodium: 137 mmol/L (ref 135–145)
Total Bilirubin: 0.9 mg/dL (ref 0.3–1.2)
Total Protein: 6.8 g/dL (ref 6.5–8.1)

## 2021-07-13 LAB — SAMPLE TO BLOOD BANK

## 2021-07-13 LAB — URINALYSIS, ROUTINE W REFLEX MICROSCOPIC

## 2021-07-13 LAB — CBC WITH DIFFERENTIAL/PLATELET
Abs Immature Granulocytes: 0.06 10*3/uL (ref 0.00–0.07)
Basophils Absolute: 0 10*3/uL (ref 0.0–0.1)
Basophils Relative: 0 %
Eosinophils Absolute: 0 10*3/uL (ref 0.0–0.5)
Eosinophils Relative: 0 %
HCT: 37.2 % — ABNORMAL LOW (ref 39.0–52.0)
Hemoglobin: 11.9 g/dL — ABNORMAL LOW (ref 13.0–17.0)
Immature Granulocytes: 1 %
Lymphocytes Relative: 8 %
Lymphs Abs: 0.9 10*3/uL (ref 0.7–4.0)
MCH: 27.5 pg (ref 26.0–34.0)
MCHC: 32 g/dL (ref 30.0–36.0)
MCV: 85.9 fL (ref 80.0–100.0)
Monocytes Absolute: 1 10*3/uL (ref 0.1–1.0)
Monocytes Relative: 8 %
Neutro Abs: 9.7 10*3/uL — ABNORMAL HIGH (ref 1.7–7.7)
Neutrophils Relative %: 83 %
Platelets: 139 10*3/uL — ABNORMAL LOW (ref 150–400)
RBC: 4.33 MIL/uL (ref 4.22–5.81)
RDW: 16.6 % — ABNORMAL HIGH (ref 11.5–15.5)
WBC: 11.6 10*3/uL — ABNORMAL HIGH (ref 4.0–10.5)
nRBC: 0 % (ref 0.0–0.2)

## 2021-07-13 LAB — URINALYSIS, MICROSCOPIC (REFLEX)
RBC / HPF: >50 RBC/hpf (ref 0–5)
Squamous Epithelial / HPF: NONE SEEN (ref 0–5)
WBC, UA: >50 WBC/hpf (ref 0–5)

## 2021-07-13 LAB — I-STAT CHEM 8, ED
BUN: 17 mg/dL (ref 8–23)
Calcium, Ion: 1.14 mmol/L — ABNORMAL LOW (ref 1.15–1.40)
Chloride: 105 mmol/L (ref 98–111)
Creatinine, Ser: 1.4 mg/dL — ABNORMAL HIGH (ref 0.61–1.24)
Glucose, Bld: 185 mg/dL — ABNORMAL HIGH (ref 70–99)
HCT: 35 % — ABNORMAL LOW (ref 39.0–52.0)
Hemoglobin: 11.9 g/dL — ABNORMAL LOW (ref 13.0–17.0)
Potassium: 3.7 mmol/L (ref 3.5–5.1)
Sodium: 139 mmol/L (ref 135–145)
TCO2: 21 mmol/L — ABNORMAL LOW (ref 22–32)

## 2021-07-13 LAB — CBG MONITORING, ED
Glucose-Capillary: 151 mg/dL — ABNORMAL HIGH (ref 70–99)
Glucose-Capillary: 140 mg/dL — ABNORMAL HIGH (ref 70–99)

## 2021-07-13 LAB — LACTIC ACID, PLASMA
Lactic Acid, Venous: 2.8 mmol/L (ref 0.5–1.9)
Lactic Acid, Venous: 2.4 mmol/L (ref 0.5–1.9)

## 2021-07-13 MED ORDER — LACTATED RINGERS IV SOLN: INTRAVENOUS | Status: AC

## 2021-07-13 MED ORDER — ACETAMINOPHEN 325 MG PO TABS
650.0000 mg | ORAL_TABLET | Freq: Four times a day (QID) | ORAL | Status: DC | PRN
  Administered 2021-07-13 – 2021-07-21 (×7): 650 mg via ORAL
  Filled 2021-07-13 (×7): qty 2

## 2021-07-13 MED ORDER — SODIUM CHLORIDE 0.9 % IV SOLN: 2.0000 g | Freq: Two times a day (BID) | INTRAVENOUS | Status: DC

## 2021-07-13 MED ORDER — ROSUVASTATIN CALCIUM 20 MG PO TABS
40.0000 mg | ORAL_TABLET | Freq: Every day | ORAL | Status: DC
  Administered 2021-07-13 – 2021-07-22 (×10): 40 mg via ORAL
  Filled 2021-07-13 (×10): qty 2

## 2021-07-13 MED ORDER — ALPRAZOLAM 0.25 MG PO TABS
0.5000 mg | ORAL_TABLET | Freq: Every evening | ORAL | Status: AC | PRN
  Administered 2021-07-13: 0.5 mg via ORAL
  Filled 2021-07-13: qty 2

## 2021-07-13 MED ORDER — LACTATED RINGERS IV SOLN: INTRAVENOUS | Status: DC

## 2021-07-13 MED ORDER — INSULIN ASPART 100 UNIT/ML IJ SOLN
0.0000 [IU] | Freq: Three times a day (TID) | INTRAMUSCULAR | Status: DC
  Administered 2021-07-14: 2 [IU] via SUBCUTANEOUS
  Administered 2021-07-14: 5 [IU] via SUBCUTANEOUS
  Administered 2021-07-15 (×2): 3 [IU] via SUBCUTANEOUS
  Administered 2021-07-15: 2 [IU] via SUBCUTANEOUS
  Administered 2021-07-16: 5 [IU] via SUBCUTANEOUS
  Administered 2021-07-16: 2 [IU] via SUBCUTANEOUS
  Administered 2021-07-17 – 2021-07-18 (×2): 1 [IU] via SUBCUTANEOUS
  Administered 2021-07-19: 8 [IU] via SUBCUTANEOUS
  Administered 2021-07-19 (×2): 5 [IU] via SUBCUTANEOUS
  Administered 2021-07-20: 2 [IU] via SUBCUTANEOUS
  Administered 2021-07-20 (×2): 3 [IU] via SUBCUTANEOUS
  Administered 2021-07-21: 8 [IU] via SUBCUTANEOUS
  Administered 2021-07-21: 5 [IU] via SUBCUTANEOUS
  Administered 2021-07-21 – 2021-07-22 (×3): 2 [IU] via SUBCUTANEOUS

## 2021-07-13 MED ORDER — LACTATED RINGERS IV BOLUS (SEPSIS): 500.0000 mL | Freq: Once | INTRAVENOUS | Status: DC

## 2021-07-13 MED ORDER — SODIUM CHLORIDE 0.9 % IV SOLN
2.0000 g | Freq: Once | INTRAVENOUS | Status: AC
  Administered 2021-07-13: 2 g via INTRAVENOUS
  Filled 2021-07-13: qty 2

## 2021-07-13 MED ORDER — METOPROLOL SUCCINATE ER 50 MG PO TB24
50.0000 mg | ORAL_TABLET | Freq: Every day | ORAL | Status: DC
  Administered 2021-07-14 – 2021-07-22 (×9): 50 mg via ORAL
  Filled 2021-07-13 (×3): qty 1
  Filled 2021-07-13: qty 2
  Filled 2021-07-13 (×5): qty 1

## 2021-07-13 MED ORDER — CEFTRIAXONE SODIUM 1 G IJ SOLR
1.0000 g | INTRAMUSCULAR | Status: AC
  Administered 2021-07-13 – 2021-07-20 (×9): 1 g via INTRAVENOUS
  Filled 2021-07-13 (×9): qty 10

## 2021-07-13 MED ORDER — LACTATED RINGERS IV BOLUS (SEPSIS)
1000.0000 mL | Freq: Once | INTRAVENOUS | Status: AC
  Administered 2021-07-13: 1000 mL via INTRAVENOUS

## 2021-07-13 MED ORDER — ACETAMINOPHEN 650 MG RE SUPP: 650.0000 mg | Freq: Four times a day (QID) | RECTAL | Status: DC | PRN
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
## 2021-07-13 NOTE — ED Notes (Signed)
Patient c/o hematuria since having foley placed on Tuesday by urology. Patinet c/o being dizzy and weak. Frank blood in foley bag.
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
## 2021-07-13 NOTE — ED Triage Notes (Signed)
Pt reports had foley catheter put in at [facility] on Tuesday and since yesterday morning not draining much in bag and urinating around catheter and having lots of bladder pain and pressure. Denies letting [facility] know.

## 2021-07-13 NOTE — Progress Notes (Addendum)
Pharmacy Antibiotic Note  Antonio [name] is a 75 y.o. male admitted on 07/13/2021 with hematuria after foley placement 9/27 with urgency but minimal output in cath bag.  Pharmacy has been consulted for cefepime dosing for UTI. SCr 1.4 - baseline ~1.2  Plan: Cefepime 2gm IV every 12 hours Monitor renal function (dose adjust as needed), CBC F/u on infectious work up, cultures/sensitivities, LOT and de-escalate as able  Temp (24hrs), Avg:99.7 F (37.6 C), Min:99.4 F (37.4 C), Max:100 F (37.8 C)  Recent Labs  Lab 07/13/21 1218  CREATININE 1.40*    CrCl cannot be calculated (Unknown ideal weight.).    Allergies  Allergen Reactions   Demerol Other (See Comments)    hallucinations    Antimicrobials this admission: Cefepime 9/30 >>   Dose adjustments this admission: none  Microbiology results: 9/30 BCx:  9/30 UCx:   Thank you for allowing pharmacy to be a part of this patient's care.  Laurey Arrow, PharmD PGY1 Pharmacy Resident 07/13/2021  12:41 PM  Please check [email or web address] for unit-specific pharmacy phone numbers.

## 2021-07-13 NOTE — ED Triage Notes (Signed)
Pt was given prescription for Flomax on Tuesday. Adds that he was told that his prostate was enlarged when in office on Tuesday.

## 2021-07-13 NOTE — ED Notes (Signed)
Patient is being discharged from the Urgent Care and sent to the Emergency Department via POV . Per Inocencio Homes, NP, patient is in need of higher level of care due to Urinary Rentention and possible catheter occlusion. Patient is aware and verbalizes understanding of plan of care.  Vitals:   07/13/21 1049  BP: 122/81  Pulse: 95  Resp: 20  Temp: 100 F (37.8 C)  SpO2: 95%
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
## 2021-07-13 NOTE — Sepsis Progress Note (Signed)
Sepsis protocol being monitored by eLink. 

## 2021-07-13 NOTE — ED Notes (Signed)
Foley irrigated at this time. Pt complaining of pain in abd and feeling full, noted catheter dry and not draining. Irrigation syringe used to pull moderate amount of small clots from bladder and then immediately foley began draining. Pt states pain improved. Linen changed and new gown placed on pt. No acute changes noted. Will continue to monitor. Pt given Xanax for anxiety and sleep.

## 2021-07-13 NOTE — Progress Notes (Signed)
[facility] night shift telemetry coverage note.  The nursing staff communicated to [place] that the patient would like something to help him sleep.  Alprazolam 0.5 mg as needed x1 dose ordered.  The staff also reported that the patient had to have his catheter flushed once during dayshift due to a blood clot but it has continued to drain urine with gross hematuria since.  Urology still has to evaluate the patient.  We will continue to monitor the Foley catheter flow pending urology evaluation for the moment.  Tennis Must, MD.

## 2021-07-14 DIAGNOSIS — R310 Gross hematuria: Secondary | ICD-10-CM | POA: Diagnosis not present

## 2021-07-14 LAB — BASIC METABOLIC PANEL
Anion gap: 8 (ref 5–15)
BUN: 12 mg/dL (ref 8–23)
CO2: 23 mmol/L (ref 22–32)
Calcium: 8.5 mg/dL — ABNORMAL LOW (ref 8.9–10.3)
Chloride: 106 mmol/L (ref 98–111)
Creatinine, Ser: 1.12 mg/dL (ref 0.61–1.24)
GFR, Estimated: >60 mL/min (ref >60)
Glucose, Bld: 94 mg/dL (ref 70–99)
Potassium: 3.4 mmol/L — ABNORMAL LOW (ref 3.5–5.1)
Sodium: 137 mmol/L (ref 135–145)

## 2021-07-14 LAB — URINE CULTURE

## 2021-07-14 LAB — CBC
HCT: 32.3 % — ABNORMAL LOW (ref 39.0–52.0)
Hemoglobin: 10.5 g/dL — ABNORMAL LOW (ref 13.0–17.0)
MCH: 27.6 pg (ref 26.0–34.0)
MCHC: 32.5 g/dL (ref 30.0–36.0)
MCV: 84.8 fL (ref 80.0–100.0)
Platelets: 112 10*3/uL — ABNORMAL LOW (ref 150–400)
RBC: 3.81 MIL/uL — ABNORMAL LOW (ref 4.22–5.81)
RDW: 16.5 % — ABNORMAL HIGH (ref 11.5–15.5)
WBC: 9.3 10*3/uL (ref 4.0–10.5)
nRBC: 0 % (ref 0.0–0.2)

## 2021-07-14 LAB — GLUCOSE, CAPILLARY
Glucose-Capillary: 126 mg/dL — ABNORMAL HIGH (ref 70–99)
Glucose-Capillary: 151 mg/dL — ABNORMAL HIGH (ref 70–99)

## 2021-07-14 LAB — TYPE AND SCREEN
ABO/RH(D): AB POS
Antibody Screen: NEGATIVE

## 2021-07-14 LAB — HEMOGLOBIN AND HEMATOCRIT, BLOOD
HCT: 35.5 % — ABNORMAL LOW (ref 39.0–52.0)
Hemoglobin: 11.2 g/dL — ABNORMAL LOW (ref 13.0–17.0)

## 2021-07-14 LAB — SARS CORONAVIRUS 2 (TAT 6-24 HRS): SARS Coronavirus 2: NEGATIVE

## 2021-07-14 LAB — CBG MONITORING, ED
Glucose-Capillary: 100 mg/dL — ABNORMAL HIGH (ref 70–99)
Glucose-Capillary: 199 mg/dL — ABNORMAL HIGH (ref 70–99)
Glucose-Capillary: 207 mg/dL — ABNORMAL HIGH (ref 70–99)

## 2021-07-14 LAB — SAVE SMEAR(SSMR), FOR PROVIDER SLIDE REVIEW

## 2021-07-14 LAB — HEPATITIS C ANTIBODY: HCV Ab: NONREACTIVE

## 2021-07-14 MED ORDER — LACTATED RINGERS IV SOLN: INTRAVENOUS | Status: DC

## 2021-07-14 MED ORDER — FINASTERIDE 5 MG PO TABS
5.0000 mg | ORAL_TABLET | Freq: Every day | ORAL | Status: DC
  Administered 2021-07-15 – 2021-07-22 (×8): 5 mg via ORAL
  Filled 2021-07-14 (×8): qty 1

## 2021-07-14 MED ORDER — OXYCODONE-ACETAMINOPHEN 5-325 MG PO TABS
1.0000 | ORAL_TABLET | Freq: Once | ORAL | Status: AC
  Administered 2021-07-14: 1 via ORAL
  Filled 2021-07-14: qty 1

## 2021-07-14 MED ORDER — BELLADONNA ALKALOIDS-OPIUM 16.2-60 MG RE SUPP
1.0000 | Freq: Three times a day (TID) | RECTAL | Status: DC | PRN
  Administered 2021-07-14 – 2021-07-18 (×5): 1 via RECTAL
  Filled 2021-07-14 (×5): qty 1

## 2021-07-14 MED ORDER — POTASSIUM CHLORIDE 20 MEQ PO PACK: 40.0000 meq | PACK | Freq: Once | ORAL | Status: DC

## 2021-07-14 MED ORDER — SODIUM CHLORIDE 0.9 % IR SOLN
3000.0000 mL | Status: DC
  Administered 2021-07-14 (×4): 3000 mL
  Administered 2021-07-15: 12000 mL
  Administered 2021-07-18: 3000 mL

## 2021-07-14 MED ORDER — CHLORHEXIDINE GLUCONATE CLOTH 2 % EX PADS
6.0000 | MEDICATED_PAD | Freq: Every day | CUTANEOUS | Status: DC
  Administered 2021-07-14 – 2021-07-22 (×9): 6 via TOPICAL

## 2021-07-14 MED ORDER — POTASSIUM CHLORIDE CRYS ER 20 MEQ PO TBCR
40.0000 meq | EXTENDED_RELEASE_TABLET | Freq: Once | ORAL | Status: AC
  Administered 2021-07-14: 40 meq via ORAL
  Filled 2021-07-14: qty 2

## 2021-07-14 NOTE — Progress Notes (Signed)
Patient's foley catheter not draining and patient is complaining of severe pain in the bladder. Foley irrigated aseptically with no resistance but no return noted. McKenzie,MD notified. MD to come see patient.

## 2021-07-14 NOTE — Progress Notes (Signed)
[facility] night shift telemetry coverage note.  The nursing staff reported that the patient's Foley catheter was not draining due to blood clot formation within the bladder clogging the catheter.  Urology consult is still pending.  Orders to place a new Foley catheter with saline irrigation were placed.  Tennis Must, MD.  [phone] addendum:  His hemoglobin level decreased from 11.9 to 10.5 g/dL.  An H&H follow-up was ordered to leave on around 1030.  Will obtain type and screen with the next blood draw as well.  Potassium levels 3.4 mmol/L.  K-Lor 40 mEq p.o. x1 dose ordered.  Tennis Must, MD.

## 2021-07-14 NOTE — ED Notes (Signed)
Per MD, continue CBI until urologist comes and evaluates pt.

## 2021-07-14 NOTE — Progress Notes (Signed)
In the ED PROGRESS NOTE    Antonio [name]  GEZ:[id number] DOB: 03/28/1946 DOA: 07/13/2021 PCP: [name], MD   Brief Narrative:  This 75 years old male with PMH significant for prostate cancer treated 20 years ago, CAD s/p CABG , history of GI bleed, hypertension, type 2 diabetes presented in the ED  with gross hematuria.  Patient reports having trouble voiding and developed hematuria 4 days ago,  his primary care physician has referred him to urologist who placed a Foley catheter.  Patient reports now catheter stopped draining and urine is dribbling out around the tubing.  Patient seems uncomfortable but denies any fever chills or malaise. CT abdomen shows likely clot in the bladder.  Urology is consulted,  started on IV cefepime for UTI.  Urology recommended to continue CBI for now.  Assessment & Plan:   Principal Problem:   Hematuria Active Problems:   Type II diabetes mellitus (HCC)   HTN (hypertension)   BPH (benign prostatic hyperplasia)   CAD (coronary artery disease)   Thrombocytopenia (HCC)  Gross hematuria: Patient presented with gross hematuria and clots in the catheter. Urology was consulted,  recommended to continue CBI. Continue antibiotics for UTI and follow-up urine cultures. Hold Effient and aspirin for now  Sepsis secondary to UTI: Patient presented with tachypnea, leukocytosis, Hypotension, lactic acid 2.7, UTI Patient has received IV fluids as per sepsis protocol. Continue ceftriaxone. Follow urine and blood cultures.  CAD history of CABG: Hold aspirin and Effient , resume when bleeding improves. Hold Imdur given hypotension. Continue metoprolol Crestor  Type 2 diabetes : Continue sliding scale.   Thrombocytopenia: Platelet count stable   DVT prophylaxis: SCDs Code Status: Full code Family Communication: No family at bedside Disposition Plan:    Status is: Inpatient  Remains inpatient appropriate because:Inpatient level of care  appropriate due to severity of illness  Dispo: The patient is from: Home              Anticipated d/c is to: Home              Patient currently is not medically stable to d/c.   Difficult to place patient No  Consultants:  Urology  Procedures: CBI   antimicrobials:  Anti-infectives (From admission, onward)    Start     Dose/Rate Route Frequency Ordered Stop   07/13/21 2100  ceFEPIme (MAXIPIME) 2 g in sodium chloride 0.9 % 100 mL IVPB  Status:  Discontinued        2 g 200 mL/hr over 30 Minutes Intravenous Every 12 hours 07/13/21 1249 07/13/21 1550   07/13/21 2100  cefTRIAXone (ROCEPHIN) 1 g in sodium chloride 0.9 % 100 mL IVPB        1 g 200 mL/hr over 30 Minutes Intravenous Every 24 hours 07/13/21 1550     07/13/21 1230  ceFEPIme (MAXIPIME) 2 g in sodium chloride 0.9 % 100 mL IVPB        2 g 200 mL/hr over 30 Minutes Intravenous  Once 07/13/21 1229 07/13/21 1349       Subjective: Patient was seen and examined at bedside.  Overnight events noted.   Patient still reports having bloody urine but getting clearer.  Objective: Vitals:   07/14/21 1000 07/14/21 1200 07/14/21 1310 07/14/21 1312  BP: 132/85 120/83 (!) 142/99   Pulse: 75 71 77   Resp: 19 20 19    Temp:    98.1 F (36.7 C)  TempSrc:    Oral  SpO2: 99%  99% 99%   Weight:      Height:        Intake/Output Summary (Last 24 hours) at 07/14/2021 1402 Last data filed at 07/14/2021 1028 Gross per 24 hour  Intake 1775 ml  Output 6400 ml  Net -4625 ml   Filed Weights   07/13/21 1203  Weight: 80.7 kg    Examination:  General exam: Comfortable with no acute distress, deconditioned. Respiratory system: Clear to auscultation bilaterally, RR 15 Cardiovascular system: S1-S2 heard, regular rate and rhythm, no murmur. Gastrointestinal system: Abdomen is soft, nontender, nondistended, BS+ Central nervous system: Alert and oriented. No focal neurological deficits. Extremities: No edema, no cyanosis, no clubbing. Skin:  No rashes, lesions or ulcers Psychiatry: Judgement and insight appear normal. Mood & affect appropriate.     Data Reviewed: I have personally reviewed following labs and imaging studies  CBC: Recent Labs  Lab 07/13/21 1206 07/13/21 1218 07/14/21 0350 07/14/21 1030  WBC 11.6*  --  9.3  --   NEUTROABS 9.7*  --   --   --   HGB 11.9* 11.9* 10.5* 11.2*  HCT 37.2* 35.0* 32.3* 35.5*  MCV 85.9  --  84.8  --   PLT 139*  --  112*  --    Basic Metabolic Panel: Recent Labs  Lab 07/13/21 1206 07/13/21 1218 07/14/21 0350  NA 137 139 137  K 3.8 3.7 3.4*  CL 107 105 106  CO2 21*  --  23  GLUCOSE 188* 185* 94  BUN 16 17 12   CREATININE 1.54* 1.40* 1.12  CALCIUM 8.8*  --  8.5*   GFR: Estimated Creatinine Clearance: 56.8 mL/min (by C-G formula based on SCr of 1.12 mg/dL). Liver Function Tests: Recent Labs  Lab 07/13/21 1206  AST 19  ALT 15  ALKPHOS 51  BILITOT 0.9  PROT 6.8  ALBUMIN 3.5   No results for input(s): LIPASE, AMYLASE in the last 168 hours. No results for input(s): AMMONIA in the last 168 hours. Coagulation Profile: No results for input(s): INR, PROTIME in the last 168 hours. Cardiac Enzymes: No results for input(s): CKTOTAL, CKMB, CKMBINDEX, TROPONINI in the last 168 hours. BNP (last 3 results) No results for input(s): PROBNP in the last 8760 hours. HbA1C: No results for input(s): HGBA1C in the last 72 hours. CBG: Recent Labs  Lab 07/13/21 1858 07/13/21 2209 07/14/21 0808 07/14/21 1125 07/14/21 1144  GLUCAP 151* 140* 100* 199* 207*   Lipid Profile: No results for input(s): CHOL, HDL, LDLCALC, TRIG, CHOLHDL, LDLDIRECT in the last 72 hours. Thyroid Function Tests: No results for input(s): TSH, T4TOTAL, FREET4, T3FREE, THYROIDAB in the last 72 hours. Anemia Panel: No results for input(s): VITAMINB12, FOLATE, FERRITIN, TIBC, IRON, RETICCTPCT in the last 72 hours. Sepsis Labs: Recent Labs  Lab 07/13/21 1300 07/13/21 1430  LATICACIDVEN 2.8* 2.4*     Recent Results (from the past 240 hour(s))  Culture, blood (routine x 2)     Status: None (Preliminary result)   Collection Time: 07/13/21  1:00 PM   Specimen: BLOOD RIGHT ARM  Result Value Ref Range Status   Specimen Description BLOOD RIGHT ARM  Final   Special Requests   Final    BOTTLES DRAWN AEROBIC AND ANAEROBIC Blood Culture results may not be optimal due to an inadequate volume of blood received in culture bottles   Culture   Final    NO GROWTH < 24 HOURS Performed at [facility], [place] [address], [place],  [address]  Report Status PENDING  Incomplete  Urine Culture     Status: Abnormal (Preliminary result)   Collection Time: 07/13/21  1:00 PM   Specimen: Urine, Catheterized  Result Value Ref Range Status   Specimen Description URINE, CATHETERIZED  Final   Special Requests   Final    NONE Performed at [facility], [address], [place], [place] [address]    Culture MULTIPLE SPECIES PRESENT, SUGGEST RECOLLECTION (A)  Final   Report Status PENDING  Incomplete  Culture, blood (routine x 2)     Status: None (Preliminary result)   Collection Time: 07/13/21  1:08 PM   Specimen: BLOOD LEFT ARM  Result Value Ref Range Status   Specimen Description BLOOD LEFT ARM  Final   Special Requests   Final    BOTTLES DRAWN AEROBIC AND ANAEROBIC Blood Culture adequate volume   Culture   Final    NO GROWTH < 24 HOURS Performed at [facility], [place] [address] [place], [place] [address]    Report Status PENDING  Incomplete  SARS CORONAVIRUS 2 (TAT 6-24 HRS) Nasopharyngeal Nasopharyngeal Swab     Status: None   Collection Time: 07/13/21 11:09 PM   Specimen: Nasopharyngeal Swab  Result Value Ref Range Status   SARS Coronavirus 2 NEGATIVE NEGATIVE Final    Comment: (NOTE) SARS-CoV-2 target nucleic acids are NOT DETECTED.  The SARS-CoV-2 RNA is generally detectable in upper and lower respiratory specimens during the acute phase of infection.  Negative results do not preclude SARS-CoV-2 infection, do not rule out co-infections with other pathogens, and should not be used as the sole basis for treatment or other patient management decisions. Negative results must be combined with clinical observations, patient history, and epidemiological information. The expected result is Negative.  Fact Sheet for Patients: SugarRoll.be  Fact Sheet for Healthcare Providers: https://www.woods-mathews.com/  This test is not yet approved or cleared by the [place] FDA and  has been authorized for detection and/or diagnosis of SARS-CoV-2 by FDA under an Emergency Use Authorization (EUA). This EUA will remain  in effect (meaning this test can be used) for the duration of the COVID-19 declaration under Se ction 564(b)(1) of the Act, 21 U.S.C. section 360bbb-3(b)(1), unless the authorization is terminated or revoked sooner.  Performed at [facility], [place] [address] [place], [place] [address]     Radiology Studies: CT Abdomen Pelvis Wo Contrast  Result Date: 07/13/2021 CLINICAL DATA:  Abdominal distension.  Bladder pain and pressure EXAM: CT ABDOMEN AND PELVIS WITHOUT CONTRAST TECHNIQUE: Multidetector CT imaging of the abdomen and pelvis was performed following the standard protocol without IV contrast. COMPARISON:  07/16/2020 FINDINGS: Lower chest: No acute findings. Hepatobiliary: No mass visualized on this unenhanced exam. Gallbladder is unremarkable. No evidence of biliary ductal dilatation. Pancreas: No mass or inflammatory process visualized on this unenhanced exam. Spleen:  Within normal limits in size. Adrenals/Urinary tract: No evidence of urolithiasis or hydronephrosis. A Foley catheter is seen within the bladder which is nondilated. High attenuation substance is seen in the nondependent portion of the urinary bladder surrounding the Foley catheter, consistent with blood clot.  Stomach/Bowel: No evidence of obstruction, inflammatory process, or abnormal fluid collections. Normal appendix visualized. Diffuse colonic diverticulosis is again demonstrated, however there is no evidence of diverticulitis. Vascular/Lymphatic: No pathologically enlarged lymph nodes identified. No evidence of abdominal aortic aneurysm. Aortic atherosclerotic calcification noted. Reproductive: Mildly enlarged prostate gland, with several fiducial markers noted. Other:  None. Musculoskeletal:  No  suspicious bone lesions identified. IMPRESSION: Foley catheter in nondilated bladder, with high attenuation substance in nondependent portion of urinary bladder consistent with blood clot. No evidence of urolithiasis or hydronephrosis. Mildly enlarged prostate. Colonic diverticulosis. No radiographic evidence of diverticulitis. Aortic Atherosclerosis (ICD10-I70.0). Electronically Signed   By: Marlaine Hind M.D.   On: 07/13/2021 14:30    Scheduled Meds:  insulin aspart  0-15 Units Subcutaneous TID WC   metoprolol succinate  50 mg Oral Daily   rosuvastatin  40 mg Oral Daily   sodium chloride flush  3 mL Intravenous Q12H   Continuous Infusions:  cefTRIAXone (ROCEPHIN)  IV Stopped (07/13/21 2302)   lactated ringers 125 mL/hr at 07/14/21 0949   sodium chloride irrigation       LOS: 1 day    Time spent: 35 mins    Khalidah Herbold, MD [facility]   If 7PM-7AM, please contact night-coverage

## 2021-07-14 NOTE — ED Notes (Signed)
Breakfast Ordered 

## 2021-07-14 NOTE — ED Notes (Signed)
Pt called out states he feels like his catheter is blocked up again. Irrigation syringe used to pull out clots and urine, continues to be gross hematuria. Bag reconnected and draining without issues. Pt states he feels much better. No acute changes noted. Will continue to monitor.

## 2021-07-14 NOTE — ED Notes (Signed)
30F latex foley removed from outside hospital, tip intact, noted large clot inside foley itself preventing irrigation or removal of urine. Urology cart obtained from OR. New 23F three way latex catheter placed, 3L NS irrigation solution connected to tubing and catheter, placed on slow gtt until urine cleared. Pt tolerated without difficulty. Will continue to monitor.
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
## 2021-07-14 NOTE — Progress Notes (Signed)
NEW ADMISSION NOTE New Admission Note:   Arrival Method: ED stretcher Mental Orientation: AAOx4 Telemetry: [phone]  Assessment: Completed Skin: to be completed IV: RAC Pain: 0/10 Tubes: foley with CBI Safety Measures: Safety Fall Prevention Plan has been given, discussed and signed Admission: Completed [facility] Orientation: Patient has been orientated to the room, unit and staff.  Family: none at bedside  Orders have been reviewed and implemented. Will continue to monitor the patient. Call light has been placed within reach and bed alarm has been activated.   Vira Agar, RN

## 2021-07-14 NOTE — ED Notes (Signed)
CBI continued. Pt has multiple blood clots to foley catheter. Pt continues to have bright red output. CBI rate increased multiple times to get desired urine color. Pt currently reports 0/10 pain scale. Will continue to monitor.

## 2021-07-14 NOTE — ED Notes (Signed)
Attempted to irrigate bladder by hand and no irrigant was able to be withdrawn. Large clots noted in the tubing and inside the foley catheter. MD paged for additional orders.

## 2021-07-15 DIAGNOSIS — R310 Gross hematuria: Secondary | ICD-10-CM | POA: Diagnosis not present

## 2021-07-15 LAB — CBC
HCT: 29.9 % — ABNORMAL LOW (ref 39.0–52.0)
Hemoglobin: 9.8 g/dL — ABNORMAL LOW (ref 13.0–17.0)
MCH: 27.5 pg (ref 26.0–34.0)
MCHC: 32.8 g/dL (ref 30.0–36.0)
MCV: 83.8 fL (ref 80.0–100.0)
Platelets: 107 10*3/uL — ABNORMAL LOW (ref 150–400)
RBC: 3.57 MIL/uL — ABNORMAL LOW (ref 4.22–5.81)
RDW: 16.2 % — ABNORMAL HIGH (ref 11.5–15.5)
WBC: 8.2 10*3/uL (ref 4.0–10.5)
nRBC: 0 % (ref 0.0–0.2)

## 2021-07-15 LAB — GLUCOSE, CAPILLARY
Glucose-Capillary: 172 mg/dL — ABNORMAL HIGH (ref 70–99)
Glucose-Capillary: 170 mg/dL — ABNORMAL HIGH (ref 70–99)
Glucose-Capillary: 142 mg/dL — ABNORMAL HIGH (ref 70–99)
Glucose-Capillary: 153 mg/dL — ABNORMAL HIGH (ref 70–99)

## 2021-07-15 LAB — BASIC METABOLIC PANEL
Anion gap: 9 (ref 5–15)
BUN: 12 mg/dL (ref 8–23)
CO2: 22 mmol/L (ref 22–32)
Calcium: 8.3 mg/dL — ABNORMAL LOW (ref 8.9–10.3)
Chloride: 105 mmol/L (ref 98–111)
Creatinine, Ser: 1.1 mg/dL (ref 0.61–1.24)
GFR, Estimated: >60 mL/min (ref >60)
Glucose, Bld: 174 mg/dL — ABNORMAL HIGH (ref 70–99)
Potassium: 4.1 mmol/L (ref 3.5–5.1)
Sodium: 136 mmol/L (ref 135–145)

## 2021-07-15 LAB — HEMOGLOBIN AND HEMATOCRIT, BLOOD
HCT: 31.5 % — ABNORMAL LOW (ref 39.0–52.0)
Hemoglobin: 10 g/dL — ABNORMAL LOW (ref 13.0–17.0)

## 2021-07-15 NOTE — Progress Notes (Signed)
Subjective: Urine light pink on slow drip CBI. No suprapubic pain. No other complaints   Objective: Vital signs in last 24 hours: Temp:  [98.8 F (37.1 C)-99.6 F (37.6 C)] 98.8 F (37.1 C) (10/02 1028) Pulse Rate:  [70-79] 73 (10/02 1028) Resp:  [16-17] 16 (10/02 1028) BP: (109-129)/(65-79) 116/65 (10/02 1028) SpO2:  [95 %-100 %] 96 % (10/02 1028)  Intake/Output from previous day: 10/01 0701 - 10/02 0700 In: 17681.9 [P.O.:100; I.V.:3481.9; IV Piggyback:100] Out: 26800 [Urine:26800] Intake/Output this shift: No intake/output data recorded.  Physical Exam:  General:alert, cooperative, and appears stated age GI: soft, non tender, normal bowel sounds, no palpable masses, no organomegaly, no inguinal hernia Male genitalia: not done Extremities: extremities normal, atraumatic, no cyanosis or edema  Lab Results: Recent Labs    07/14/21 1030 07/15/21 0232 07/15/21 1654  HGB 11.2* 9.8* 10.0*  HCT 35.5* 29.9* 31.5*   BMET Recent Labs    07/14/21 0350 07/15/21 0232  NA 137 136  K 3.4* 4.1  CL 106 105  CO2 23 22  GLUCOSE 94 174*  BUN 12 12  CREATININE 1.12 1.10  CALCIUM 8.5* 8.3*   No results for input(s): LABPT, INR in the last 72 hours. No results for input(s): LABURIN in the last 72 hours. Results for orders placed or performed during the hospital encounter of 07/13/21  Culture, blood (routine x 2)     Status: None (Preliminary result)   Collection Time: 07/13/21  1:00 PM   Specimen: BLOOD RIGHT ARM  Result Value Ref Range Status   Specimen Description BLOOD RIGHT ARM  Final   Special Requests   Final    BOTTLES DRAWN AEROBIC AND ANAEROBIC Blood Culture results may not be optimal due to an inadequate volume of blood received in culture bottles   Culture   Final    NO GROWTH 2 DAYS Performed at [facility], [place] [address], [place], [place] [address]    Report Status PENDING  Incomplete  Urine Culture     Status: Abnormal   Collection Time: 07/13/21   1:00 PM   Specimen: Urine, Catheterized  Result Value Ref Range Status   Specimen Description URINE, CATHETERIZED  Final   Special Requests   Final    NONE Performed at [facility], [place] [address], [place], [place] [address]    Culture MULTIPLE SPECIES PRESENT, SUGGEST RECOLLECTION (A)  Final   Report Status 07/14/2021 FINAL  Final  Culture, blood (routine x 2)     Status: None (Preliminary result)   Collection Time: 07/13/21  1:08 PM   Specimen: BLOOD LEFT ARM  Result Value Ref Range Status   Specimen Description BLOOD LEFT ARM  Final   Special Requests   Final    BOTTLES DRAWN AEROBIC AND ANAEROBIC Blood Culture adequate volume   Culture   Final    NO GROWTH 2 DAYS Performed at [facility], [place] [address], [place], [place] [address]    Report Status PENDING  Incomplete  SARS CORONAVIRUS 2 (TAT 6-24 HRS) Nasopharyngeal Nasopharyngeal Swab     Status: None   Collection Time: 07/13/21 11:09 PM   Specimen: Nasopharyngeal Swab  Result Value Ref Range Status   SARS Coronavirus 2 NEGATIVE NEGATIVE Final    Comment: (NOTE) SARS-CoV-2 target nucleic acids are NOT DETECTED.  The SARS-CoV-2 RNA is generally detectable in upper and lower respiratory specimens during the acute phase of infection. Negative results do not preclude SARS-CoV-2 infection, do not  rule out co-infections with other pathogens, and should not be used as the sole basis for treatment or other patient management decisions. Negative results must be combined with clinical observations, patient history, and epidemiological information. The expected result is Negative.  Fact Sheet for Patients: SugarRoll.be  Fact Sheet for Healthcare Providers: https://www.woods-mathews.com/  This test is not yet approved or cleared by the [place] FDA and  has been authorized for detection and/or diagnosis of SARS-CoV-2 by FDA under an Emergency Use Authorization (EUA).  This EUA will remain  in effect (meaning this test can be used) for the duration of the COVID-19 declaration under Se ction 564(b)(1) of the Act, 21 U.S.C. section 360bbb-3(b)(1), unless the authorization is terminated or revoked sooner.  Performed at [facility], [place] [address], [place], [place] [address]     Studies/Results: No results found.  Assessment/Plan: 75yo with gross Hematuria Continue to wean CBI to off. Urology to continue to follow   LOS: 2 days   [name] 07/15/2021, 7:45 PM

## 2021-07-15 NOTE — Plan of Care (Signed)
  Problem: Education: Goal: Knowledge of General Education information will improve Description Including pain rating scale, medication(s)/side effects and non-pharmacologic comfort measures Outcome: Progressing   Problem: Elimination: Goal: Will not experience complications related to urinary retention Outcome: Progressing   Problem: Safety: Goal: Ability to remain free from injury will improve Outcome: Progressing   

## 2021-07-15 NOTE — Progress Notes (Signed)
In the ED PROGRESS NOTE    Antonio [name]  TKZ:[id number] DOB: [birth year] DOA: 07/13/2021 PCP: [name], MD   Brief Narrative:  This 75 years old male with PMH significant for prostate cancer treated 20 years ago, CAD s/p CABG , history of GI bleed, hypertension, type 2 diabetes presented in the ED  with gross hematuria.  Patient reports having trouble voiding and developed hematuria 4 days ago,  his primary care physician has referred him to urologist who placed a Foley catheter.  Patient reports now catheter stopped draining and urine is dribbling out around the tubing.  Patient seems uncomfortable but denies any fever chills or malaise. CT abdomen showed likely clot in the bladder.  Urology is consulted,  started on IV ceftriaxone for UTI.  Urology recommended to continue CBI for now.  Assessment & Plan:   Principal Problem:   Hematuria Active Problems:   Type II diabetes mellitus (HCC)   HTN (hypertension)   BPH (benign prostatic hyperplasia)   CAD (coronary artery disease)   Thrombocytopenia (HCC)  Gross hematuria: Patient presented with gross hematuria and clots in the catheter. Urology was consulted,  recommended to continue CBI. Continue antibiotics for UTI and follow-up urine cultures. Hold Effient and aspirin for now. Monitor H&H.  Hemoglobin down from 11.2->9.8  Sepsis secondary to UTI: Patient presented with tachypnea, leukocytosis, Hypotension, lactic acid 2.7, UTI. CT scan shows blood clots in the catheter, no stones or hydronephrosis Patient has received IV fluids as per sepsis protocol. Continue ceftriaxone. Follow urine and blood cultures.  CAD history of CABG: Hold aspirin and Effient , resume when bleeding improves. Hold Imdur given hypotension. Continue metoprolol Crestor  Type 2 diabetes : Continue sliding scale.   Thrombocytopenia: Platelet count stable   DVT prophylaxis: SCDs Code Status: Full code Family Communication: No family at  bedside Disposition Plan:    Status is: Inpatient  Remains inpatient appropriate because:Inpatient level of care appropriate due to severity of illness  Dispo: The patient is from: Home              Anticipated d/c is to: Home              Patient currently is not medically stable to d/c.   Difficult to place patient No  Consultants:  Urology  Procedures: CBI   antimicrobials:  Anti-infectives (From admission, onward)    Start     Dose/Rate Route Frequency Ordered Stop   07/13/21 2100  ceFEPIme (MAXIPIME) 2 g in sodium chloride 0.9 % 100 mL IVPB  Status:  Discontinued        2 g 200 mL/hr over 30 Minutes Intravenous Every 12 hours 07/13/21 1249 07/13/21 1550   07/13/21 2100  cefTRIAXone (ROCEPHIN) 1 g in sodium chloride 0.9 % 100 mL IVPB        1 g 200 mL/hr over 30 Minutes Intravenous Every 24 hours 07/13/21 1550     07/13/21 1230  ceFEPIme (MAXIPIME) 2 g in sodium chloride 0.9 % 100 mL IVPB        2 g 200 mL/hr over 30 Minutes Intravenous  Once 07/13/21 1229 07/13/21 1349       Subjective: Patient was seen and examined at bedside.  Overnight events noted.   Patient still reports having bloody urine but it is getting clear. Urology has irrigated the bladder. Objective: Vitals:   07/14/21 2008 07/15/21 0052 07/15/21 0521 07/15/21 1028  BP: 120/79 129/74 109/67 116/65  Pulse: 70 76 79 73  Resp: 16 17 16 16   Temp: 98.9 F (37.2 C) 99.1 F (37.3 C) 99.6 F (37.6 C) 98.8 F (37.1 C)  TempSrc: Oral Oral Oral Oral  SpO2: 100% 96% 95% 96%  Weight:      Height:        Intake/Output Summary (Last 24 hours) at 07/15/2021 1327 Last data filed at 07/15/2021 1250 Gross per 24 hour  Intake 19446.87 ml  Output 27800 ml  Net -8353.13 ml   Filed Weights   07/13/21 1203  Weight: 80.7 kg    Examination:  General exam: Appears comfortable with no acute distress, deconditioned. Respiratory system: Clear to auscultation bilaterally, RR 15 Cardiovascular system: S1-S2  heard, regular rate and rhythm, no murmur. Gastrointestinal system: Abdomen is soft, nontender, nondistended, BS+ Central nervous system: Alert and oriented x3. No focal neurological deficits. Extremities: No edema, no cyanosis, no clubbing. Skin: No rashes, lesions or ulcers Psychiatry: Judgement and insight appear normal. Mood & affect appropriate.     Data Reviewed: I have personally reviewed following labs and imaging studies  CBC: Recent Labs  Lab 07/13/21 1206 07/13/21 1218 07/14/21 0350 07/14/21 1030 07/15/21 0232  WBC 11.6*  --  9.3  --  8.2  NEUTROABS 9.7*  --   --   --   --   HGB 11.9* 11.9* 10.5* 11.2* 9.8*  HCT 37.2* 35.0* 32.3* 35.5* 29.9*  MCV 85.9  --  84.8  --  83.8  PLT 139*  --  112*  --  280*   Basic Metabolic Panel: Recent Labs  Lab 07/13/21 1206 07/13/21 1218 07/14/21 0350 07/15/21 0232  NA 137 139 137 136  K 3.8 3.7 3.4* 4.1  CL 107 105 106 105  CO2 21*  --  23 22  GLUCOSE 188* 185* 94 174*  BUN 16 17 12 12   CREATININE 1.54* 1.40* 1.12 1.10  CALCIUM 8.8*  --  8.5* 8.3*   GFR: Estimated Creatinine Clearance: 57.8 mL/min (by C-G formula based on SCr of 1.1 mg/dL). Liver Function Tests: Recent Labs  Lab 07/13/21 1206  AST 19  ALT 15  ALKPHOS 51  BILITOT 0.9  PROT 6.8  ALBUMIN 3.5   No results for input(s): LIPASE, AMYLASE in the last 168 hours. No results for input(s): AMMONIA in the last 168 hours. Coagulation Profile: No results for input(s): INR, PROTIME in the last 168 hours. Cardiac Enzymes: No results for input(s): CKTOTAL, CKMB, CKMBINDEX, TROPONINI in the last 168 hours. BNP (last 3 results) No results for input(s): PROBNP in the last 8760 hours. HbA1C: No results for input(s): HGBA1C in the last 72 hours. CBG: Recent Labs  Lab 07/14/21 1144 07/14/21 1717 07/14/21 2159 07/15/21 0641 07/15/21 1200  GLUCAP 207* 126* 151* 172* 170*   Lipid Profile: No results for input(s): CHOL, HDL, LDLCALC, TRIG, CHOLHDL, LDLDIRECT  in the last 72 hours. Thyroid Function Tests: No results for input(s): TSH, T4TOTAL, FREET4, T3FREE, THYROIDAB in the last 72 hours. Anemia Panel: No results for input(s): VITAMINB12, FOLATE, FERRITIN, TIBC, IRON, RETICCTPCT in the last 72 hours. Sepsis Labs: Recent Labs  Lab 07/13/21 1300 07/13/21 1430  LATICACIDVEN 2.8* 2.4*    Recent Results (from the past 240 hour(s))  Culture, blood (routine x 2)     Status: None (Preliminary result)   Collection Time: 07/13/21  1:00 PM   Specimen: BLOOD RIGHT ARM  Result Value Ref Range Status   Specimen Description BLOOD RIGHT ARM  Final   Special Requests   Final  BOTTLES DRAWN AEROBIC AND ANAEROBIC Blood Culture results may not be optimal due to an inadequate volume of blood received in culture bottles   Culture   Final    NO GROWTH 2 DAYS Performed at [facility], [place] [address] [place], [place] [address]    Report Status PENDING  Incomplete  Urine Culture     Status: Abnormal   Collection Time: 07/13/21  1:00 PM   Specimen: Urine, Catheterized  Result Value Ref Range Status   Specimen Description URINE, CATHETERIZED  Final   Special Requests   Final    NONE Performed at [facility], [place] [address] [place], [place] [address]    Culture MULTIPLE SPECIES PRESENT, SUGGEST RECOLLECTION (A)  Final   Report Status 07/14/2021 FINAL  Final  Culture, blood (routine x 2)     Status: None (Preliminary result)   Collection Time: 07/13/21  1:08 PM   Specimen: BLOOD LEFT ARM  Result Value Ref Range Status   Specimen Description BLOOD LEFT ARM  Final   Special Requests   Final    BOTTLES DRAWN AEROBIC AND ANAEROBIC Blood Culture adequate volume   Culture   Final    NO GROWTH 2 DAYS Performed at [facility], [place] [address], [place], [place] [address]    Report Status PENDING  Incomplete  SARS CORONAVIRUS 2 (TAT 6-24 HRS) Nasopharyngeal Nasopharyngeal Swab     Status: None   Collection Time: 07/13/21 11:09 PM    Specimen: Nasopharyngeal Swab  Result Value Ref Range Status   SARS Coronavirus 2 NEGATIVE NEGATIVE Final    Comment: (NOTE) SARS-CoV-2 target nucleic acids are NOT DETECTED.  The SARS-CoV-2 RNA is generally detectable in upper and lower respiratory specimens during the acute phase of infection. Negative results do not preclude SARS-CoV-2 infection, do not rule out co-infections with other pathogens, and should not be used as the sole basis for treatment or other patient management decisions. Negative results must be combined with clinical observations, patient history, and epidemiological information. The expected result is Negative.  Fact Sheet for Patients: SugarRoll.be  Fact Sheet for Healthcare Providers: https://www.woods-mathews.com/  This test is not yet approved or cleared by the [place] FDA and  has been authorized for detection and/or diagnosis of SARS-CoV-2 by FDA under an Emergency Use Authorization (EUA). This EUA will remain  in effect (meaning this test can be used) for the duration of the COVID-19 declaration under Se ction 564(b)(1) of the Act, 21 U.S.C. section 360bbb-3(b)(1), unless the authorization is terminated or revoked sooner.  Performed at [facility], [place] [address] [place], [place] [address]     Radiology Studies: CT Abdomen Pelvis Wo Contrast  Result Date: 07/13/2021 CLINICAL DATA:  Abdominal distension.  Bladder pain and pressure EXAM: CT ABDOMEN AND PELVIS WITHOUT CONTRAST TECHNIQUE: Multidetector CT imaging of the abdomen and pelvis was performed following the standard protocol without IV contrast. COMPARISON:  07/16/2020 FINDINGS: Lower chest: No acute findings. Hepatobiliary: No mass visualized on this unenhanced exam. Gallbladder is unremarkable. No evidence of biliary ductal dilatation. Pancreas: No mass or inflammatory process visualized on this unenhanced exam. Spleen:  Within normal  limits in size. Adrenals/Urinary tract: No evidence of urolithiasis or hydronephrosis. A Foley catheter is seen within the bladder which is nondilated. High attenuation substance is seen in the nondependent portion of the urinary bladder surrounding the Foley catheter, consistent with blood clot. Stomach/Bowel: No evidence of obstruction, inflammatory process, or abnormal fluid  collections. Normal appendix visualized. Diffuse colonic diverticulosis is again demonstrated, however there is no evidence of diverticulitis. Vascular/Lymphatic: No pathologically enlarged lymph nodes identified. No evidence of abdominal aortic aneurysm. Aortic atherosclerotic calcification noted. Reproductive: Mildly enlarged prostate gland, with several fiducial markers noted. Other:  None. Musculoskeletal:  No suspicious bone lesions identified. IMPRESSION: Foley catheter in nondilated bladder, with high attenuation substance in nondependent portion of urinary bladder consistent with blood clot. No evidence of urolithiasis or hydronephrosis. Mildly enlarged prostate. Colonic diverticulosis. No radiographic evidence of diverticulitis. Aortic Atherosclerosis (ICD10-I70.0). Electronically Signed   By: Marlaine Hind M.D.   On: 07/13/2021 14:30    Scheduled Meds:  Chlorhexidine Gluconate Cloth  6 each Topical Daily   finasteride  5 mg Oral Daily   insulin aspart  0-15 Units Subcutaneous TID WC   metoprolol succinate  50 mg Oral Daily   rosuvastatin  40 mg Oral Daily   Continuous Infusions:  cefTRIAXone (ROCEPHIN)  IV Stopped (07/15/21 0043)   lactated ringers 125 mL/hr at 07/15/21 0045   sodium chloride irrigation       LOS: 2 days    Time spent: 25 mins    Romesha Scherer, MD [facility]   If 7PM-7AM, please contact night-coverage

## 2021-07-16 DIAGNOSIS — R310 Gross hematuria: Secondary | ICD-10-CM | POA: Diagnosis not present

## 2021-07-16 LAB — CBC
HCT: 29.9 % — ABNORMAL LOW (ref 39.0–52.0)
Hemoglobin: 9.6 g/dL — ABNORMAL LOW (ref 13.0–17.0)
MCH: 27 pg (ref 26.0–34.0)
MCHC: 32.1 g/dL (ref 30.0–36.0)
MCV: 84.2 fL (ref 80.0–100.0)
Platelets: 122 10*3/uL — ABNORMAL LOW (ref 150–400)
RBC: 3.55 MIL/uL — ABNORMAL LOW (ref 4.22–5.81)
RDW: 15.9 % — ABNORMAL HIGH (ref 11.5–15.5)
WBC: 7.1 10*3/uL (ref 4.0–10.5)
nRBC: 0 % (ref 0.0–0.2)

## 2021-07-16 LAB — GLUCOSE, CAPILLARY
Glucose-Capillary: 98 mg/dL (ref 70–99)
Glucose-Capillary: 149 mg/dL — ABNORMAL HIGH (ref 70–99)
Glucose-Capillary: 205 mg/dL — ABNORMAL HIGH (ref 70–99)

## 2021-07-16 LAB — BASIC METABOLIC PANEL
Anion gap: 5 (ref 5–15)
BUN: 10 mg/dL (ref 8–23)
CO2: 26 mmol/L (ref 22–32)
Calcium: 8.6 mg/dL — ABNORMAL LOW (ref 8.9–10.3)
Chloride: 107 mmol/L (ref 98–111)
Creatinine, Ser: 1.05 mg/dL (ref 0.61–1.24)
GFR, Estimated: >60 mL/min (ref >60)
Glucose, Bld: 143 mg/dL — ABNORMAL HIGH (ref 70–99)
Potassium: 4.1 mmol/L (ref 3.5–5.1)
Sodium: 138 mmol/L (ref 135–145)

## 2021-07-16 LAB — PATHOLOGIST SMEAR REVIEW

## 2021-07-16 NOTE — TOC Initial Note (Signed)
Transition of Care [facility]) - Initial/Assessment Note    Patient Details  Name: Antonio [name] MRN: [id number] Date of Birth: 08/14/1946  [facility] CM/SW Contact:    Tom-Johnson, Renea Ee, RN Phone Number: 07/16/2021, 2:36 PM  Clinical Narrative:                 Patient is from home with wife with PMH significant for prostate cancer, CAD s/p CABG, GI bleed, hypertension, and type 2 diabetes. Went to PCP, Dr. Martinique and referred to Urologist, Dr. Noah Delaine who placed a three way foley catheter which drains bloody urine with clots. Admitted with gross hematuria. Has three children whom are supportive with care. Retired from [employer]. Independent with ADL's and ambulates with cane or walker. Able to drive self to and from appointments. Medical work up continues. One of his children will transport at discharge. Denies any needs at this time. TOC will continue to follow with needs.   Barriers to Discharge: Continued Medical Work up   Patient Goals and CMS Choice Patient states their goals for this hospitalization and ongoing recovery are:: To go home      Expected Discharge Plan and Services     Discharge Planning Services: CM Consult   Living arrangements for the past 2 months: Single Family Home                                      Prior Living Arrangements/Services Living arrangements for the past 2 months: Single Family Home Lives with:: Spouse Patient language and need for interpreter reviewed:: Yes Do you feel safe going back to the place where you live?: Yes      Need for Family Participation in Patient Care: Yes (Comment) Care giver support system in place?: Yes (comment) Current home services: DME Kasandra Knudsen, Environmental consultant) Criminal Activity/Legal Involvement Pertinent to Current Situation/Hospitalization: No - Comment as needed  Activities of Daily Living Home Assistive Devices/Equipment: None ADL Screening (condition at time of  admission) Patient's cognitive ability adequate to safely complete daily activities?: Yes Is the patient deaf or have difficulty hearing?: No Does the patient have difficulty seeing, even when wearing glasses/contacts?: No Does the patient have difficulty concentrating, remembering, or making decisions?: No Patient able to express need for assistance with ADLs?: Yes Does the patient have difficulty dressing or bathing?: No Independently performs ADLs?: Yes (appropriate for developmental age) Does the patient have difficulty walking or climbing stairs?: No Weakness of Legs: None Weakness of Arms/Hands: None  Permission Sought/Granted Permission sought to share information with : Case Manager, Family Supports                Emotional Assessment Appearance:: Appears stated age Attitude/Demeanor/Rapport: Engaged Affect (typically observed): Accepting, Appropriate, Hopeful Orientation: : Oriented to Self, Oriented to Place, Oriented to  Time, Oriented to Situation Alcohol / Substance Use: Not Applicable Psych Involvement: No (comment)  Admission diagnosis:  Gross hematuria [R31.0] Hematuria [R31.9] Sepsis, due to unspecified organism, unspecified whether acute organ dysfunction present [facility]) [A41.9] Patient Active Problem List   Diagnosis Date Noted   Hematuria 07/13/2021   GERD (gastroesophageal reflux disease)    Hypotension    AKI (acute kidney injury) ([place])    Thrombocytopenia (HCC)    S/P angioplasty with stent-DES ostial VG to PDA and DES ostial VG to diag. 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   Prostate cancer ([place])  01/28/2014   Unstable angina ([place]) 09/03/2012   Lower GI bleeding 02/12/2012   BRBPR (bright red blood per rectum) 01/03/2012   Type II diabetes mellitus ([place]) 12/17/2010   Hyperlipidemia associated with type 2 diabetes mellitus ([place]) 12/17/2010   Alcohol abuse, 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

## 2021-07-16 NOTE — Progress Notes (Signed)
3 Way foley continues to drain bloody  with clots. At present no urine is draining out even with NS flushings. No blockage felt when flushing but no return. CBI NS is not draining as well. Line is wide open. Denies pain at this moment.

## 2021-07-16 NOTE — Plan of Care (Signed)
  Problem: Education: Goal: Knowledge of General Education information will improve Description Including pain rating scale, medication(s)/side effects and non-pharmacologic comfort measures Outcome: Progressing   Problem: Health Behavior/Discharge Planning: Goal: Ability to manage health-related needs will improve Outcome: Progressing   

## 2021-07-16 NOTE — Progress Notes (Signed)
In the ED PROGRESS NOTE    ED MANDICH  EXN:[id number] DOB: 1945/12/01 DOA: 07/13/2021 PCP: [name], MD   Brief Narrative:  This 75 years old male with PMH significant for prostate cancer treated 20 years ago, CAD s/p CABG , history of GI bleed, hypertension, type 2 diabetes presented in the ED  with gross hematuria.  Patient reports having trouble voiding and developed hematuria 4 days ago,  his primary care physician has referred him to urologist who placed a Foley catheter.  Patient reports now catheter stopped draining and urine is dribbling out around the tubing.  Patient seems uncomfortable but denies any fever chills or malaise. CT abdomen showed likely clots in the bladder.  Urology is consulted,  started on IV ceftriaxone for UTI.  Urology recommended to continue CBI for now.  Assessment & Plan:   Principal Problem:   Hematuria Active Problems:   Type II diabetes mellitus (HCC)   HTN (hypertension)   BPH (benign prostatic hyperplasia)   CAD (coronary artery disease)   Thrombocytopenia (HCC)  Gross hematuria: Patient presented with gross hematuria and clots in the catheter. Urology was consulted,  recommended to continue CBI. Continue antibiotics for UTI. Hold Effient and aspirin for now. Monitor H&H.  Hemoglobin down from 11.2->9.8 Urine is getting clear.  Urine culture contaminated.  Sepsis secondary to UTI: Patient presented with tachypnea, leukocytosis, Hypotension, lactic acid 2.7, UTI. CT scan shows blood clots in the catheter, no stones or hydronephrosis Patient has received IV fluids as per sepsis protocol. Continue ceftriaxone. Blood cultures no growth so far, urine cultures contaminated.  CAD history of CABG: Hold aspirin and Effient , resume when bleeding improves. Will resume Imdur since blood pressure is now improved. Continue metoprolol , Crestor  Type 2 diabetes : Continue sliding scale.  Thrombocytopenia: Platelet count  stable.   DVT prophylaxis: SCDs Code Status: Full code Family Communication: No family at bedside Disposition Plan:    Status is: Inpatient  Remains inpatient appropriate because:Inpatient level of care appropriate due to severity of illness  Dispo: The patient is from: Home              Anticipated d/c is to: Home              Patient currently is not medically stable to d/c.   Difficult to place patient No  Consultants:  Urology  Procedures: CBI   antimicrobials:  Anti-infectives (From admission, onward)    Start     Dose/Rate Route Frequency Ordered Stop   07/13/21 2100  ceFEPIme (MAXIPIME) 2 g in sodium chloride 0.9 % 100 mL IVPB  Status:  Discontinued        2 g 200 mL/hr over 30 Minutes Intravenous Every 12 hours 07/13/21 1249 07/13/21 1550   07/13/21 2100  cefTRIAXone (ROCEPHIN) 1 g in sodium chloride 0.9 % 100 mL IVPB        1 g 200 mL/hr over 30 Minutes Intravenous Every 24 hours 07/13/21 1550     07/13/21 1230  ceFEPIme (MAXIPIME) 2 g in sodium chloride 0.9 % 100 mL IVPB        2 g 200 mL/hr over 30 Minutes Intravenous  Once 07/13/21 1229 07/13/21 1349       Subjective: Patient was seen and examined at bedside.  Overnight events noted.   Urine is getting pinkish now, continued on CBI.  Patient reports feeling improved.  Objective: Vitals:   07/15/21 1028 07/15/21 2117 07/16/21 0533 07/16/21 1100  BP: 116/65 129/72 (!) 159/80 138/77  Pulse: 73 69 68 64  Resp: 16 20 18 15   Temp: 98.8 F (37.1 C) 98.7 F (37.1 C) 98.5 F (36.9 C) 98.7 F (37.1 C)  TempSrc: Oral Oral Oral Oral  SpO2: 96% 95% 99% 95%  Weight:      Height:        Intake/Output Summary (Last 24 hours) at 07/16/2021 1449 Last data filed at 07/16/2021 1100 Gross per 24 hour  Intake 17945.18 ml  Output 15920 ml  Net 2025.18 ml   Filed Weights   07/13/21 1203  Weight: 80.7 kg    Examination:  General exam: Appears comfortable with no acute distress, deconditioned. Respiratory  system: Clear to auscultation bilaterally, RR 15 Cardiovascular system: S1-S2 heard, regular rate and rhythm, no murmur. Gastrointestinal system: Abdomen is soft, nontender, nondistended, BS+ Central nervous system: Alert and oriented x3. No focal neurological deficits. Genitourinary: Patient with pinkish urine in the Foley catheter. Extremities: No edema, no cyanosis, no clubbing. Skin: No rashes, lesions or ulcers Psychiatry: Judgement and insight appear normal. Mood & affect appropriate.     Data Reviewed: I have personally reviewed following labs and imaging studies  CBC: Recent Labs  Lab 07/13/21 1206 07/13/21 1218 07/14/21 0350 07/14/21 1030 07/15/21 0232 07/15/21 1654 07/16/21 0138  WBC 11.6*  --  9.3  --  8.2  --  7.1  NEUTROABS 9.7*  --   --   --   --   --   --   HGB 11.9*   < > 10.5* 11.2* 9.8* 10.0* 9.6*  HCT 37.2*   < > 32.3* 35.5* 29.9* 31.5* 29.9*  MCV 85.9  --  84.8  --  83.8  --  84.2  PLT 139*  --  112*  --  107*  --  122*   < > = values in this interval not displayed.   Basic Metabolic Panel: Recent Labs  Lab 07/13/21 1206 07/13/21 1218 07/14/21 0350 07/15/21 0232 07/16/21 0138  NA 137 139 137 136 138  K 3.8 3.7 3.4* 4.1 4.1  CL 107 105 106 105 107  CO2 21*  --  23 22 26   GLUCOSE 188* 185* 94 174* 143*  BUN 16 17 12 12 10   CREATININE 1.54* 1.40* 1.12 1.10 1.05  CALCIUM 8.8*  --  8.5* 8.3* 8.6*   GFR: Estimated Creatinine Clearance: 60.5 mL/min (by C-G formula based on SCr of 1.05 mg/dL). Liver Function Tests: Recent Labs  Lab 07/13/21 1206  AST 19  ALT 15  ALKPHOS 51  BILITOT 0.9  PROT 6.8  ALBUMIN 3.5   No results for input(s): LIPASE, AMYLASE in the last 168 hours. No results for input(s): AMMONIA in the last 168 hours. Coagulation Profile: No results for input(s): INR, PROTIME in the last 168 hours. Cardiac Enzymes: No results for input(s): CKTOTAL, CKMB, CKMBINDEX, TROPONINI in the last 168 hours. BNP (last 3 results) No  results for input(s): PROBNP in the last 8760 hours. HbA1C: No results for input(s): HGBA1C in the last 72 hours. CBG: Recent Labs  Lab 07/15/21 1200 07/15/21 1653 07/15/21 2222 07/16/21 0653 07/16/21 1127  GLUCAP 170* 142* 153* 98 149*   Lipid Profile: No results for input(s): CHOL, HDL, LDLCALC, TRIG, CHOLHDL, LDLDIRECT in the last 72 hours. Thyroid Function Tests: No results for input(s): TSH, T4TOTAL, FREET4, T3FREE, THYROIDAB in the last 72 hours. Anemia Panel: No results for input(s): VITAMINB12, FOLATE, FERRITIN, TIBC, IRON, RETICCTPCT in the last 72 hours.  Sepsis Labs: Recent Labs  Lab 07/13/21 1300 07/13/21 1430  LATICACIDVEN 2.8* 2.4*    Recent Results (from the past 240 hour(s))  Culture, blood (routine x 2)     Status: None (Preliminary result)   Collection Time: 07/13/21  1:00 PM   Specimen: BLOOD RIGHT ARM  Result Value Ref Range Status   Specimen Description BLOOD RIGHT ARM  Final   Special Requests   Final    BOTTLES DRAWN AEROBIC AND ANAEROBIC Blood Culture results may not be optimal due to an inadequate volume of blood received in culture bottles   Culture   Final    NO GROWTH 3 DAYS Performed at [facility], [place] [address], [place], [place] [address]    Report Status PENDING  Incomplete  Urine Culture     Status: Abnormal   Collection Time: 07/13/21  1:00 PM   Specimen: Urine, Catheterized  Result Value Ref Range Status   Specimen Description URINE, CATHETERIZED  Final   Special Requests   Final    NONE Performed at [facility], [place] [address] [place], [place] [address]    Culture MULTIPLE SPECIES PRESENT, SUGGEST RECOLLECTION (A)  Final   Report Status 07/14/2021 FINAL  Final  Culture, blood (routine x 2)     Status: None (Preliminary result)   Collection Time: 07/13/21  1:08 PM   Specimen: BLOOD LEFT ARM  Result Value Ref Range Status   Specimen Description BLOOD LEFT ARM  Final   Special Requests   Final    BOTTLES DRAWN  AEROBIC AND ANAEROBIC Blood Culture adequate volume   Culture   Final    NO GROWTH 3 DAYS Performed at [facility], [place] [address] [place], [place] [address]    Report Status PENDING  Incomplete  SARS CORONAVIRUS 2 (TAT 6-24 HRS) Nasopharyngeal Nasopharyngeal Swab     Status: None   Collection Time: 07/13/21 11:09 PM   Specimen: Nasopharyngeal Swab  Result Value Ref Range Status   SARS Coronavirus 2 NEGATIVE NEGATIVE Final    Comment: (NOTE) SARS-CoV-2 target nucleic acids are NOT DETECTED.  The SARS-CoV-2 RNA is generally detectable in upper and lower respiratory specimens during the acute phase of infection. Negative results do not preclude SARS-CoV-2 infection, do not rule out co-infections with other pathogens, and should not be used as the sole basis for treatment or other patient management decisions. Negative results must be combined with clinical observations, patient history, and epidemiological information. The expected result is Negative.  Fact Sheet for Patients: SugarRoll.be  Fact Sheet for Healthcare Providers: https://www.woods-mathews.com/  This test is not yet approved or cleared by the [place] FDA and  has been authorized for detection and/or diagnosis of SARS-CoV-2 by FDA under an Emergency Use Authorization (EUA). This EUA will remain  in effect (meaning this test can be used) for the duration of the COVID-19 declaration under Se ction 564(b)(1) of the Act, 21 U.S.C. section 360bbb-3(b)(1), unless the authorization is terminated or revoked sooner.  Performed at [facility], [place] [address] [place] [address] [place],  [address]     Radiology Studies: No results found.  Scheduled Meds:  Chlorhexidine Gluconate Cloth  6 each Topical Daily   finasteride  5 mg Oral Daily   insulin aspart  0-15 Units Subcutaneous TID WC   metoprolol succinate  50 mg Oral Daily   rosuvastatin  40 mg Oral Daily    Continuous Infusions:  cefTRIAXone (ROCEPHIN)  IV 1 g (  07/15/21 2118)   lactated ringers 125 mL/hr at 07/16/21 0534   sodium chloride irrigation       LOS: 3 days    Time spent: 25 mins    Elery Cadenhead, MD [facility]   If 7PM-7AM, please contact night-coverage

## 2021-07-16 NOTE — Plan of Care (Signed)
°  Problem: Education: °Goal: Knowledge of General Education information will improve °Description: Including pain rating scale, medication(s)/side effects and non-pharmacologic comfort measures °Outcome: Progressing °  °Problem: Elimination: °Goal: Will not experience complications related to bowel motility °Outcome: Progressing °Goal: Will not experience complications related to urinary retention °Outcome: Progressing °  °Problem: Pain Managment: °Goal: General experience of comfort will improve °Outcome: Progressing °  °

## 2021-07-16 NOTE — Progress Notes (Signed)
Subjective: Overnight the patient's catheter was intermittently draining.  The nurses hand irrigated overnight.  The patient was complaining of intermittent bladder discomfort.  The catheter bag was changed and CBI was stopped.  This morning it was draining, but it is draining mostly frank blood.  Objective: Vital signs in last 24 hours: Temp:  [98.5 F (36.9 C)-98.7 F (37.1 C)] 98.7 F (37.1 C) (10/03 1100) Pulse Rate:  [64-69] 64 (10/03 1100) Resp:  [15-20] 15 (10/03 1100) BP: (129-159)/(72-80) 138/77 (10/03 1100) SpO2:  [95 %-99 %] 95 % (10/03 1100)  Intake/Output from previous day: 10/02 0701 - 10/03 0700 In: 18385.2 [P.O.:857; I.V.:2428.2; IV Piggyback:100] Out: 19800 [Urine:19800] Intake/Output this shift: Total I/O In: 3000 [Other:3000] Out: 4120 [Urine:4120]  Physical Exam:  General:alert, cooperative, and appears stated age GI: soft, non tender, normal bowel sounds, no palpable masses, no organomegaly, no inguinal hernia Male genitalia: not done Penis: normal, no lesions Extremities: extremities normal, atraumatic, no cyanosis or edema  Lab Results: Recent Labs    07/15/21 0232 07/15/21 1654 07/16/21 0138  HGB 9.8* 10.0* 9.6*  HCT 29.9* 31.5* 29.9*    BMET Recent Labs    07/15/21 0232 07/16/21 0138  NA 136 138  K 4.1 4.1  CL 105 107  CO2 22 26  GLUCOSE 174* 143*  BUN 12 10  CREATININE 1.10 1.05  CALCIUM 8.3* 8.6*    No results for input(s): LABPT, INR in the last 72 hours. No results for input(s): LABURIN in the last 72 hours. Results for orders placed or performed during the hospital encounter of 07/13/21  Culture, blood (routine x 2)     Status: None (Preliminary result)   Collection Time: 07/13/21  1:00 PM   Specimen: BLOOD RIGHT ARM  Result Value Ref Range Status   Specimen Description BLOOD RIGHT ARM  Final   Special Requests   Final    BOTTLES DRAWN AEROBIC AND ANAEROBIC Blood Culture results may not be optimal due to an inadequate  volume of blood received in culture bottles   Culture   Final    NO GROWTH 3 DAYS Performed at [facility], [place] [address] [place], [place] [address]    Report Status PENDING  Incomplete  Urine Culture     Status: Abnormal   Collection Time: 07/13/21  1:00 PM   Specimen: Urine, Catheterized  Result Value Ref Range Status   Specimen Description URINE, CATHETERIZED  Final   Special Requests   Final    NONE Performed at [facility], [place] [address] [place], [place] [address]    Culture MULTIPLE SPECIES PRESENT, SUGGEST RECOLLECTION (A)  Final   Report Status 07/14/2021 FINAL  Final  Culture, blood (routine x 2)     Status: None (Preliminary result)   Collection Time: 07/13/21  1:08 PM   Specimen: BLOOD LEFT ARM  Result Value Ref Range Status   Specimen Description BLOOD LEFT ARM  Final   Special Requests   Final    BOTTLES DRAWN AEROBIC AND ANAEROBIC Blood Culture adequate volume   Culture   Final    NO GROWTH 3 DAYS Performed at [facility], [place] [address] [place], [place] [address]    Report Status PENDING  Incomplete  SARS CORONAVIRUS 2 (TAT 6-24 HRS) Nasopharyngeal Nasopharyngeal Swab     Status: None   Collection Time: 07/13/21 11:09 PM   Specimen: Nasopharyngeal Swab  Result Value Ref Range Status   SARS Coronavirus 2 NEGATIVE NEGATIVE  Final    Comment: (NOTE) SARS-CoV-2 target nucleic acids are NOT DETECTED.  The SARS-CoV-2 RNA is generally detectable in upper and lower respiratory specimens during the acute phase of infection. Negative results do not preclude SARS-CoV-2 infection, do not rule out co-infections with other pathogens, and should not be used as the sole basis for treatment or other patient management decisions. Negative results must be combined with clinical observations, patient history, and epidemiological information. The expected result is Negative.  Fact Sheet for Patients: SugarRoll.be  Fact  Sheet for Healthcare Providers: https://www.woods-mathews.com/  This test is not yet approved or cleared by the [place] FDA and  has been authorized for detection and/or diagnosis of SARS-CoV-2 by FDA under an Emergency Use Authorization (EUA). This EUA will remain  in effect (meaning this test can be used) for the duration of the COVID-19 declaration under Se ction 564(b)(1) of the Act, 21 U.S.C. section 360bbb-3(b)(1), unless the authorization is terminated or revoked sooner.  Performed at [facility], [place] [address], [place], [place] [address]     Studies/Results: No results found.  Assessment/Plan: 75yo with gross Hematuria I suspect the patient was having spasms overnight and this morning.  I did irrigate him around lunchtime today and got a few clots, but mostly his bladder was empty.  I restarted the CBI the very slow drip and the urine was clear. Would recommend using BNO suppositories as needed to help with the spasms. Wean CBI as able.   LOS: 3 days   Ardis Hughs 07/16/2021, 3:12 PM

## 2021-07-17 DIAGNOSIS — R310 Gross hematuria: Secondary | ICD-10-CM | POA: Diagnosis not present

## 2021-07-17 LAB — BASIC METABOLIC PANEL
Anion gap: 9 (ref 5–15)
BUN: 9 mg/dL (ref 8–23)
CO2: 25 mmol/L (ref 22–32)
Calcium: 8.4 mg/dL — ABNORMAL LOW (ref 8.9–10.3)
Chloride: 103 mmol/L (ref 98–111)
Creatinine, Ser: 1.05 mg/dL (ref 0.61–1.24)
GFR, Estimated: >60 mL/min (ref >60)
Glucose, Bld: 127 mg/dL — ABNORMAL HIGH (ref 70–99)
Potassium: 3.6 mmol/L (ref 3.5–5.1)
Sodium: 137 mmol/L (ref 135–145)

## 2021-07-17 LAB — CBC
HCT: 28.4 % — ABNORMAL LOW (ref 39.0–52.0)
Hemoglobin: 9.2 g/dL — ABNORMAL LOW (ref 13.0–17.0)
MCH: 27.4 pg (ref 26.0–34.0)
MCHC: 32.4 g/dL (ref 30.0–36.0)
MCV: 84.5 fL (ref 80.0–100.0)
Platelets: 146 10*3/uL — ABNORMAL LOW (ref 150–400)
RBC: 3.36 MIL/uL — ABNORMAL LOW (ref 4.22–5.81)
RDW: 15.7 % — ABNORMAL HIGH (ref 11.5–15.5)
WBC: 6.2 10*3/uL (ref 4.0–10.5)
nRBC: 0 % (ref 0.0–0.2)

## 2021-07-17 LAB — GLUCOSE, CAPILLARY
Glucose-Capillary: 149 mg/dL — ABNORMAL HIGH (ref 70–99)
Glucose-Capillary: 107 mg/dL — ABNORMAL HIGH (ref 70–99)
Glucose-Capillary: 229 mg/dL — ABNORMAL HIGH (ref 70–99)
Glucose-Capillary: 120 mg/dL — ABNORMAL HIGH (ref 70–99)
Glucose-Capillary: 149 mg/dL — ABNORMAL HIGH (ref 70–99)

## 2021-07-17 LAB — SURGICAL PCR SCREEN
MRSA, PCR: NEGATIVE
Staphylococcus aureus: NEGATIVE

## 2021-07-17 LAB — PHOSPHORUS: Phosphorus: 3.2 mg/dL (ref 2.5–4.6)

## 2021-07-17 LAB — MAGNESIUM: Magnesium: 1.7 mg/dL (ref 1.7–2.4)

## 2021-07-17 MED ORDER — ISOSORBIDE MONONITRATE ER 60 MG PO TB24
90.0000 mg | ORAL_TABLET | Freq: Every morning | ORAL | Status: DC
  Administered 2021-07-17 – 2021-07-22 (×6): 90 mg via ORAL
  Filled 2021-07-17 (×6): qty 1

## 2021-07-17 MED ORDER — ZOLPIDEM TARTRATE 5 MG PO TABS
5.0000 mg | ORAL_TABLET | Freq: Once | ORAL | Status: AC
  Administered 2021-07-17: 5 mg via ORAL
  Filled 2021-07-17: qty 1

## 2021-07-17 NOTE — Progress Notes (Signed)
Subjective: No acute events overnight.  The catheter noted to be draining fairly well on the slow to moderate CBI drip.  When stopping the drip this morning, he had significant clots form and thus it was restarted.  Patient denies any significant pain.  Objective: Vital signs in last 24 hours: Temp:  [98 F (36.7 C)-99.2 F (37.3 C)] 98 F (36.7 C) (10/04 1017) Pulse Rate:  [72] 72 (10/04 0614) Resp:  [16-18] 16 (10/04 0614) BP: (131-136)/(88-90) 136/88 (10/04 0614) SpO2:  [99 %] 99 % (10/04 0614)  Intake/Output from previous day: 10/03 0701 - 10/04 0700 In: 5920.3 [I.V.:1620.3; IV Piggyback:100] Out: 22820 [Urine:22820] Intake/Output this shift: Total I/O In: 8141.9 [P.O.:240; I.V.:1301.9; Other:6600] Out: 10000 [Urine:10000]  Physical Exam:  General:alert, cooperative, and appears stated age GI: soft, non tender, normal bowel sounds, no palpable masses, no organomegaly, no inguinal hernia Male genitalia: not done Penis: normal, no lesions Extremities: extremities normal, atraumatic, no cyanosis or edema  Lab Results: Recent Labs    07/15/21 1654 07/16/21 0138 07/17/21 0435  HGB 10.0* 9.6* 9.2*  HCT 31.5* 29.9* 28.4*    BMET Recent Labs    07/16/21 0138 07/17/21 0435  NA 138 137  K 4.1 3.6  CL 107 103  CO2 26 25  GLUCOSE 143* 127*  BUN 10 9  CREATININE 1.05 1.05  CALCIUM 8.6* 8.4*    No results for input(s): LABPT, INR in the last 72 hours. No results for input(s): LABURIN in the last 72 hours. Results for orders placed or performed during the hospital encounter of 07/13/21  Culture, blood (routine x 2)     Status: None (Preliminary result)   Collection Time: 07/13/21  1:00 PM   Specimen: BLOOD RIGHT ARM  Result Value Ref Range Status   Specimen Description BLOOD RIGHT ARM  Final   Special Requests   Final    BOTTLES DRAWN AEROBIC AND ANAEROBIC Blood Culture results may not be optimal due to an inadequate volume of blood received in culture bottles    Culture   Final    NO GROWTH 4 DAYS Performed at [facility], [place] [address], [place], [place] [address]    Report Status PENDING  Incomplete  Urine Culture     Status: Abnormal   Collection Time: 07/13/21  1:00 PM   Specimen: Urine, Catheterized  Result Value Ref Range Status   Specimen Description URINE, CATHETERIZED  Final   Special Requests   Final    NONE Performed at [facility], [place] [address], [place], [place] [address]    Culture MULTIPLE SPECIES PRESENT, SUGGEST RECOLLECTION (A)  Final   Report Status 07/14/2021 FINAL  Final  Culture, blood (routine x 2)     Status: None (Preliminary result)   Collection Time: 07/13/21  1:08 PM   Specimen: BLOOD LEFT ARM  Result Value Ref Range Status   Specimen Description BLOOD LEFT ARM  Final   Special Requests   Final    BOTTLES DRAWN AEROBIC AND ANAEROBIC Blood Culture adequate volume   Culture   Final    NO GROWTH 4 DAYS Performed at [facility], [place] [address], [place], [place] [address]    Report Status PENDING  Incomplete  SARS CORONAVIRUS 2 (TAT 6-24 HRS) Nasopharyngeal Nasopharyngeal Swab     Status: None   Collection Time: 07/13/21 11:09 PM   Specimen: Nasopharyngeal Swab  Result Value Ref Range Status   SARS Coronavirus 2 NEGATIVE NEGATIVE Final  Comment: (NOTE) SARS-CoV-2 target nucleic acids are NOT DETECTED.  The SARS-CoV-2 RNA is generally detectable in upper and lower respiratory specimens during the acute phase of infection. Negative results do not preclude SARS-CoV-2 infection, do not rule out co-infections with other pathogens, and should not be used as the sole basis for treatment or other patient management decisions. Negative results must be combined with clinical observations, patient history, and epidemiological information. The expected result is Negative.  Fact Sheet for Patients: SugarRoll.be  Fact Sheet for Healthcare  Providers: https://www.woods-mathews.com/  This test is not yet approved or cleared by the [place] FDA and  has been authorized for detection and/or diagnosis of SARS-CoV-2 by FDA under an Emergency Use Authorization (EUA). This EUA will remain  in effect (meaning this test can be used) for the duration of the COVID-19 declaration under Se ction 564(b)(1) of the Act, 21 U.S.C. section 360bbb-3(b)(1), unless the authorization is terminated or revoked sooner.  Performed at [facility], [place] [address], [place], [place] [address]     Studies/Results: No results found.  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  The bleeding is persistent despite CBI, unable to wean him off currently.   This afternoon it was clearing.  I stopped it to see if we could keep it off.  However, I did make him n.p.o. past midnight with plans to take him to the OR tomorrow afternoon for fulguration if we are unable to get the continuous bladder irrigation off by tomorrow.   LOS: 4 days   Ardis Hughs 07/17/2021, 5:43 PM

## 2021-07-17 NOTE — Plan of Care (Signed)
  Problem: Health Behavior/Discharge Planning: Goal: Ability to manage health-related needs will improve Outcome: Progressing   Problem: Clinical Measurements: Goal: Ability to maintain clinical measurements within normal limits will improve Outcome: Progressing Goal: Will remain free from infection Outcome: Progressing   

## 2021-07-17 NOTE — Progress Notes (Signed)
In the ED PROGRESS NOTE    Antonio [name]  EHU:[id number] DOB: 07-06-1946 DOA: 07/13/2021 PCP: [name], MD   Brief Narrative:  This 75 years old male with PMH significant for prostate cancer treated 20 years ago, CAD s/p CABG , history of GI bleed, hypertension, type 2 diabetes presented in the ED  with gross hematuria.  Patient reports having trouble voiding and developed hematuria 4 days ago,  his primary care physician has referred him to urologist who placed a Foley catheter.  Patient reports now catheter stopped draining and urine is dribbling out around the tubing.  Patient seems uncomfortable but denies any fever chills or malaise. CT abdomen showed likely clots in the bladder.  Urology is consulted,  started on IV ceftriaxone for UTI.  Urology recommended to continue CBI for now.  Assessment & Plan:   Principal Problem:   Hematuria Active Problems:   Type II diabetes mellitus (HCC)   HTN (hypertension)   BPH (benign prostatic hyperplasia)   CAD (coronary artery disease)   Thrombocytopenia (HCC)  Gross hematuria: Patient presented with gross hematuria and passing clots in the catheter. Urology was consulted,  recommended to continue CBI. Continue antibiotics for UTI. Hold Effient and aspirin for now. Monitor H&H.  Hemoglobin down from 11.2->9.8 > 9.6>9.2 Urine is getting pinkish.  Urine culture contaminated. CBI was stopped and now resumed.  Sepsis secondary to UTI: Patient presented with tachypnea, leukocytosis, Hypotension, lactic acid 2.7, UTI. CT scan shows blood clots in the catheter, no stones or hydronephrosis Patient has received IV fluids as per sepsis protocol. Continue ceftriaxone. Blood cultures no growth so far, urine cultures contaminated. Sepsis physiology is improving.  CAD history of CABG: Hold aspirin and Effient , resume when bleeding improves. Will resume Imdur since blood pressure is now improved. Continue metoprolol , Crestor  Type 2  diabetes : Continue sliding scale.  Thrombocytopenia: Platelet count stable.   DVT prophylaxis: SCDs Code Status: Full code Family Communication: No family at bedside Disposition Plan:    Status is: Inpatient  Remains inpatient appropriate because:Inpatient level of care appropriate due to severity of illness  Dispo: The patient is from: Home              Anticipated d/c is to: Home              Patient currently is not medically stable to d/c.   Difficult to place patient No  Consultants:  Urology  Procedures: CBI   antimicrobials:  Anti-infectives (From admission, onward)    Start     Dose/Rate Route Frequency Ordered Stop   07/13/21 2100  ceFEPIme (MAXIPIME) 2 g in sodium chloride 0.9 % 100 mL IVPB  Status:  Discontinued        2 g 200 mL/hr over 30 Minutes Intravenous Every 12 hours 07/13/21 1249 07/13/21 1550   07/13/21 2100  cefTRIAXone (ROCEPHIN) 1 g in sodium chloride 0.9 % 100 mL IVPB        1 g 200 mL/hr over 30 Minutes Intravenous Every 24 hours 07/13/21 1550     07/13/21 1230  ceFEPIme (MAXIPIME) 2 g in sodium chloride 0.9 % 100 mL IVPB        2 g 200 mL/hr over 30 Minutes Intravenous  Once 07/13/21 1229 07/13/21 1349       Subjective: Patient was seen and examined at bedside.  Overnight events noted.   Urine is getting pinkish now, was continued on CBI.   Patient reports intermittent  bladder discomfort.  Objective: Vitals:   07/16/21 0533 07/16/21 1100 07/17/21 0134 07/17/21 0614  BP: (!) 159/80 138/77 131/90 136/88  Pulse: 68 64 72 72  Resp: 18 15 18 16   Temp: 98.5 F (36.9 C) 98.7 F (37.1 C) 99.2 F (37.3 C) 98 F (36.7 C)  TempSrc: Oral Oral  Oral  SpO2: 99% 95% 99% 99%  Weight:      Height:        Intake/Output Summary (Last 24 hours) at 07/17/2021 1212 Last data filed at 07/17/2021 1127 Gross per 24 hour  Intake 6372.37 ml  Output 20500 ml  Net -14127.63 ml   Filed Weights   07/13/21 1203  Weight: 80.7 kg     Examination:  General exam: Appears comfortable, not in any acute distress, deconditioned.   Respiratory system: Clear to auscultation bilaterally, RR 12 Cardiovascular system: S1-S2 heard, regular rate and rhythm, no murmur. Gastrointestinal system: Abdomen is soft, nontender, nondistended, BS+ Central nervous system: Alert and oriented x 3. No focal neurological deficits. Genitourinary: Patient with pinkish urine in the Foley catheter. Extremities: No edema, no cyanosis, no clubbing. Skin: No rashes, lesions or ulcers Psychiatry: Judgement and insight appear normal. Mood & affect appropriate.     Data Reviewed: I have personally reviewed following labs and imaging studies  CBC: Recent Labs  Lab 07/13/21 1206 07/13/21 1218 07/14/21 0350 07/14/21 1030 07/15/21 0232 07/15/21 1654 07/16/21 0138 07/17/21 0435  WBC 11.6*  --  9.3  --  8.2  --  7.1 6.2  NEUTROABS 9.7*  --   --   --   --   --   --   --   HGB 11.9*   < > 10.5* 11.2* 9.8* 10.0* 9.6* 9.2*  HCT 37.2*   < > 32.3* 35.5* 29.9* 31.5* 29.9* 28.4*  MCV 85.9  --  84.8  --  83.8  --  84.2 84.5  PLT 139*  --  112*  --  107*  --  122* 146*   < > = values in this interval not displayed.   Basic Metabolic Panel: Recent Labs  Lab 07/13/21 1206 07/13/21 1218 07/14/21 0350 07/15/21 0232 07/16/21 0138 07/17/21 0435  NA 137 139 137 136 138 137  K 3.8 3.7 3.4* 4.1 4.1 3.6  CL 107 105 106 105 107 103  CO2 21*  --  23 22 26 25   GLUCOSE 188* 185* 94 174* 143* 127*  BUN 16 17 12 12 10 9   CREATININE 1.54* 1.40* 1.12 1.10 1.05 1.05  CALCIUM 8.8*  --  8.5* 8.3* 8.6* 8.4*  MG  --   --   --   --   --  1.7  PHOS  --   --   --   --   --  3.2   GFR: Estimated Creatinine Clearance: 60.5 mL/min (by C-G formula based on SCr of 1.05 mg/dL). Liver Function Tests: Recent Labs  Lab 07/13/21 1206  AST 19  ALT 15  ALKPHOS 51  BILITOT 0.9  PROT 6.8  ALBUMIN 3.5   No results for input(s): LIPASE, AMYLASE in the last 168  hours. No results for input(s): AMMONIA in the last 168 hours. Coagulation Profile: No results for input(s): INR, PROTIME in the last 168 hours. Cardiac Enzymes: No results for input(s): CKTOTAL, CKMB, CKMBINDEX, TROPONINI in the last 168 hours. BNP (last 3 results) No results for input(s): PROBNP in the last 8760 hours. HbA1C: No results for input(s): HGBA1C in the last 72  hours. CBG: Recent Labs  Lab 07/16/21 1127 07/16/21 1639 07/17/21 0137 07/17/21 0617 07/17/21 1154  GLUCAP 149* 205* 149* 107* 229*   Lipid Profile: No results for input(s): CHOL, HDL, LDLCALC, TRIG, CHOLHDL, LDLDIRECT in the last 72 hours. Thyroid Function Tests: No results for input(s): TSH, T4TOTAL, FREET4, T3FREE, THYROIDAB in the last 72 hours. Anemia Panel: No results for input(s): VITAMINB12, FOLATE, FERRITIN, TIBC, IRON, RETICCTPCT in the last 72 hours. Sepsis Labs: Recent Labs  Lab 07/13/21 1300 07/13/21 1430  LATICACIDVEN 2.8* 2.4*    Recent Results (from the past 240 hour(s))  Culture, blood (routine x 2)     Status: None (Preliminary result)   Collection Time: 07/13/21  1:00 PM   Specimen: BLOOD RIGHT ARM  Result Value Ref Range Status   Specimen Description BLOOD RIGHT ARM  Final   Special Requests   Final    BOTTLES DRAWN AEROBIC AND ANAEROBIC Blood Culture results may not be optimal due to an inadequate volume of blood received in culture bottles   Culture   Final    NO GROWTH 4 DAYS Performed at [facility], [place] [address] [place], [place] [address]    Report Status PENDING  Incomplete  Urine Culture     Status: Abnormal   Collection Time: 07/13/21  1:00 PM   Specimen: Urine, Catheterized  Result Value Ref Range Status   Specimen Description URINE, CATHETERIZED  Final   Special Requests   Final    NONE Performed at [facility], [place] [address], [place], [place] [address]    Culture MULTIPLE SPECIES PRESENT, SUGGEST RECOLLECTION (A)  Final   Report Status  07/14/2021 FINAL  Final  Culture, blood (routine x 2)     Status: None (Preliminary result)   Collection Time: 07/13/21  1:08 PM   Specimen: BLOOD LEFT ARM  Result Value Ref Range Status   Specimen Description BLOOD LEFT ARM  Final   Special Requests   Final    BOTTLES DRAWN AEROBIC AND ANAEROBIC Blood Culture adequate volume   Culture   Final    NO GROWTH 4 DAYS Performed at [facility], [place] [address] [place], [place] [address]    Report Status PENDING  Incomplete  SARS CORONAVIRUS 2 (TAT 6-24 HRS) Nasopharyngeal Nasopharyngeal Swab     Status: None   Collection Time: 07/13/21 11:09 PM   Specimen: Nasopharyngeal Swab  Result Value Ref Range Status   SARS Coronavirus 2 NEGATIVE NEGATIVE Final    Comment: (NOTE) SARS-CoV-2 target nucleic acids are NOT DETECTED.  The SARS-CoV-2 RNA is generally detectable in upper and lower respiratory specimens during the acute phase of infection. Negative results do not preclude SARS-CoV-2 infection, do not rule out co-infections with other pathogens, and should not be used as the sole basis for treatment or other patient management decisions. Negative results must be combined with clinical observations, patient history, and epidemiological information. The expected result is Negative.  Fact Sheet for Patients: SugarRoll.be  Fact Sheet for Healthcare Providers: https://www.woods-mathews.com/  This test is not yet approved or cleared by the [place] FDA and  has been authorized for detection and/or diagnosis of SARS-CoV-2 by FDA under an Emergency Use Authorization (EUA). This EUA will remain  in effect (meaning this test can be used) for the duration of the COVID-19 declaration under Se ction 564(b)(1) of the Act, 21 U.S.C. section 360bbb-3(b)(1), unless the authorization is terminated or revoked sooner.  Performed at [facility]  [facility], [address] [place], [place] [address]      Radiology Studies: No results found.  Scheduled Meds:  Chlorhexidine Gluconate Cloth  6 each Topical Daily   finasteride  5 mg Oral Daily   insulin aspart  0-15 Units Subcutaneous TID WC   metoprolol succinate  50 mg Oral Daily   rosuvastatin  40 mg Oral Daily   Continuous Infusions:  cefTRIAXone (ROCEPHIN)  IV 1 g (07/16/21 2240)   lactated ringers 125 mL/hr at 07/17/21 1116   sodium chloride irrigation       LOS: 4 days    Time spent: 25 mins    Stevin Bielinski, MD [facility]   If 7PM-7AM, please contact night-coverage

## 2021-07-17 NOTE — Plan of Care (Signed)

## 2021-07-17 NOTE — Plan of Care (Signed)
  Problem: Education: Goal: Knowledge of General Education information will improve Description: Including pain rating scale, medication(s)/side effects and non-pharmacologic comfort measures Outcome: Progressing   Problem: Clinical Measurements: Goal: Will remain free from infection Outcome: Progressing   Problem: Elimination: Goal: Will not experience complications related to urinary retention Outcome: Progressing

## 2021-07-18 ENCOUNTER — Encounter (HOSPITAL_COMMUNITY)
Admission: EM | Disposition: A | Discharge status: Home / Self Care | Payer: Self-pay | Source: Home / Self Care | Attending: Family Medicine

## 2021-07-18 ENCOUNTER — Inpatient Hospital Stay (HOSPITAL_COMMUNITY): Payer: Medicare Other | Admitting: Anesthesiology

## 2021-07-18 ENCOUNTER — Encounter (HOSPITAL_COMMUNITY): Payer: Self-pay | Admitting: Obstetrics and Gynecology

## 2021-07-18 DIAGNOSIS — R310 Gross hematuria: Secondary | ICD-10-CM | POA: Diagnosis not present

## 2021-07-18 HISTORY — PX: CYSTOSCOPY W/ URETERAL STENT PLACEMENT: SHX1429

## 2021-07-18 LAB — CBC
HCT: 23.3 % — ABNORMAL LOW (ref 39.0–52.0)
HCT: 25.2 % — ABNORMAL LOW (ref 39.0–52.0)
Hemoglobin: 7.6 g/dL — ABNORMAL LOW (ref 13.0–17.0)
Hemoglobin: 8.3 g/dL — ABNORMAL LOW (ref 13.0–17.0)
MCH: 27.3 pg (ref 26.0–34.0)
MCH: 27.8 pg (ref 26.0–34.0)
MCHC: 32.6 g/dL (ref 30.0–36.0)
MCHC: 32.9 g/dL (ref 30.0–36.0)
MCV: 83.8 fL (ref 80.0–100.0)
MCV: 84.3 fL (ref 80.0–100.0)
Platelets: 139 10*3/uL — ABNORMAL LOW (ref 150–400)
Platelets: 146 10*3/uL — ABNORMAL LOW (ref 150–400)
RBC: 2.78 MIL/uL — ABNORMAL LOW (ref 4.22–5.81)
RBC: 2.99 MIL/uL — ABNORMAL LOW (ref 4.22–5.81)
RDW: 15.6 % — ABNORMAL HIGH (ref 11.5–15.5)
RDW: 15.6 % — ABNORMAL HIGH (ref 11.5–15.5)
WBC: 5.9 10*3/uL (ref 4.0–10.5)
WBC: 6.6 10*3/uL (ref 4.0–10.5)
nRBC: 0 % (ref 0.0–0.2)
nRBC: 0 % (ref 0.0–0.2)

## 2021-07-18 LAB — CULTURE, BLOOD (ROUTINE X 2)
Culture: NO GROWTH
Culture: NO GROWTH
Special Requests: ADEQUATE

## 2021-07-18 LAB — GLUCOSE, CAPILLARY
Glucose-Capillary: 121 mg/dL — ABNORMAL HIGH (ref 70–99)
Glucose-Capillary: 275 mg/dL — ABNORMAL HIGH (ref 70–99)
Glucose-Capillary: 282 mg/dL — ABNORMAL HIGH (ref 70–99)
Glucose-Capillary: 310 mg/dL — ABNORMAL HIGH (ref 70–99)
Glucose-Capillary: 84 mg/dL (ref 70–99)
Glucose-Capillary: 93 mg/dL (ref 70–99)
Glucose-Capillary: 97 mg/dL (ref 70–99)
Glucose-Capillary: 98 mg/dL (ref 70–99)

## 2021-07-18 SURGERY — CYSTOSCOPY, WITH RETROGRADE PYELOGRAM AND URETERAL STENT INSERTION
Anesthesia: General

## 2021-07-18 MED ORDER — 0.9 % SODIUM CHLORIDE (POUR BTL) OPTIME
TOPICAL | Status: DC | PRN
  Administered 2021-07-18: 1000 mL

## 2021-07-18 MED ORDER — FENTANYL CITRATE (PF) 100 MCG/2ML IJ SOLN
INTRAMUSCULAR | Status: AC
  Filled 2021-07-18: qty 2

## 2021-07-18 MED ORDER — AMISULPRIDE (ANTIEMETIC) 5 MG/2ML IV SOLN
10.0000 mg | Freq: Once | INTRAVENOUS | Status: DC | PRN
Start: 2021-07-18 — End: 2021-07-18

## 2021-07-18 MED ORDER — EPHEDRINE SULFATE-NACL 50-0.9 MG/10ML-% IV SOSY
PREFILLED_SYRINGE | INTRAVENOUS | Status: DC | PRN
  Administered 2021-07-18 (×2): 10 mg via INTRAVENOUS

## 2021-07-18 MED ORDER — PROPOFOL 10 MG/ML IV BOLUS
INTRAVENOUS | Status: DC | PRN
  Administered 2021-07-18: 150 mg via INTRAVENOUS
  Administered 2021-07-18: 50 mg via INTRAVENOUS

## 2021-07-18 MED ORDER — TRAMADOL HCL 50 MG PO TABS
50.0000 mg | ORAL_TABLET | Freq: Four times a day (QID) | ORAL | Status: DC | PRN
  Administered 2021-07-18 – 2021-07-22 (×5): 100 mg via ORAL
  Filled 2021-07-18 (×5): qty 2

## 2021-07-18 MED ORDER — LIDOCAINE 2% (20 MG/ML) 5 ML SYRINGE
INTRAMUSCULAR | Status: DC | PRN
  Administered 2021-07-18: 60 mg via INTRAVENOUS

## 2021-07-18 MED ORDER — PHENYLEPHRINE 40 MCG/ML (10ML) SYRINGE FOR IV PUSH (FOR BLOOD PRESSURE SUPPORT)
PREFILLED_SYRINGE | INTRAVENOUS | Status: DC | PRN
  Administered 2021-07-18: 80 ug via INTRAVENOUS

## 2021-07-18 MED ORDER — ONDANSETRON HCL 4 MG/2ML IJ SOLN
INTRAMUSCULAR | Status: DC | PRN
  Administered 2021-07-18: 4 mg via INTRAVENOUS

## 2021-07-18 MED ORDER — ORAL CARE MOUTH RINSE: 15.0000 mL | Freq: Once | OROMUCOSAL | Status: AC

## 2021-07-18 MED ORDER — FENTANYL CITRATE (PF) 250 MCG/5ML IJ SOLN
INTRAMUSCULAR | Status: AC
  Filled 2021-07-18: qty 5

## 2021-07-18 MED ORDER — CHLORHEXIDINE GLUCONATE 0.12 % MT SOLN
OROMUCOSAL | Status: AC
  Administered 2021-07-18: 15 mL via OROMUCOSAL
  Filled 2021-07-18: qty 15

## 2021-07-18 MED ORDER — LACTATED RINGERS IV SOLN: INTRAVENOUS | Status: DC

## 2021-07-18 MED ORDER — FENTANYL CITRATE (PF) 100 MCG/2ML IJ SOLN
25.0000 ug | INTRAMUSCULAR | Status: DC | PRN
  Administered 2021-07-18 (×2): 50 ug via INTRAVENOUS

## 2021-07-18 MED ORDER — GLYCINE 1.5 % IR SOLN
Status: DC | PRN
  Administered 2021-07-18 (×2): 3000 mL

## 2021-07-18 MED ORDER — CHLORHEXIDINE GLUCONATE 0.12 % MT SOLN: 15.0000 mL | Freq: Once | OROMUCOSAL | Status: AC

## 2021-07-18 MED ORDER — MIDAZOLAM HCL 2 MG/2ML IJ SOLN
INTRAMUSCULAR | Status: AC
  Filled 2021-07-18: qty 2

## 2021-07-18 MED ORDER — DEXAMETHASONE SODIUM PHOSPHATE 10 MG/ML IJ SOLN
INTRAMUSCULAR | Status: DC | PRN
  Administered 2021-07-18: 10 mg via INTRAVENOUS

## 2021-07-18 MED ORDER — SODIUM CHLORIDE 0.9 % IR SOLN
Status: DC | PRN
  Administered 2021-07-18: 3000 mL via INTRAVESICAL

## 2021-07-18 MED ORDER — BELLADONNA ALKALOIDS-OPIUM 16.2-60 MG RE SUPP
1.0000 | Freq: Once | RECTAL | Status: DC
  Filled 2021-07-18: qty 1

## 2021-07-18 MED ORDER — PROPOFOL 10 MG/ML IV BOLUS
INTRAVENOUS | Status: AC
  Filled 2021-07-18: qty 20

## 2021-07-18 MED ORDER — FENTANYL CITRATE (PF) 250 MCG/5ML IJ SOLN
INTRAMUSCULAR | Status: DC | PRN
  Administered 2021-07-18 (×2): 50 ug via INTRAVENOUS

## 2021-07-18 SURGICAL SUPPLY — 26 items
BAG URINE DRAIN 2000ML AR STRL (UROLOGICAL SUPPLIES) ×2 IMPLANT
BAG URO CATCHER STRL LF (MISCELLANEOUS) ×2 IMPLANT
CATH FOLEY 2WAY SLVR  5CC 16FR (CATHETERS)
CATH FOLEY 2WAY SLVR 5CC 16FR (CATHETERS) IMPLANT
CATH FOLEY 3WAY 30CC 22FR (CATHETERS) ×1 IMPLANT
CATH INTERMIT  6FR 70CM (CATHETERS) ×2 IMPLANT
GLOVE SURG ENC MOIS LTX SZ8 (GLOVE) ×2 IMPLANT
GOWN STRL REUS W/ TWL LRG LVL3 (GOWN DISPOSABLE) ×1 IMPLANT
GOWN STRL REUS W/ TWL XL LVL3 (GOWN DISPOSABLE) ×1 IMPLANT
GOWN STRL REUS W/TWL LRG LVL3 (GOWN DISPOSABLE) ×2
GOWN STRL REUS W/TWL XL LVL3 (GOWN DISPOSABLE) ×2
GUIDEWIRE ANG ZIPWIRE 038X150 (WIRE) IMPLANT
GUIDEWIRE STR DUAL SENSOR (WIRE) ×2 IMPLANT
KIT TURNOVER KIT B (KITS) ×2 IMPLANT
MANIFOLD NEPTUNE II (INSTRUMENTS) IMPLANT
NS IRRIG 1000ML POUR BTL (IV SOLUTION) IMPLANT
PACK CYSTO (CUSTOM PROCEDURE TRAY) ×2 IMPLANT
PLUG CATH AND CAP STER (CATHETERS) ×1 IMPLANT
STENT URET 6FRX24 CONTOUR (STENTS) IMPLANT
STENT URET 6FRX26 CONTOUR (STENTS) IMPLANT
SYPHON OMNI JUG (MISCELLANEOUS) ×2 IMPLANT
SYR TOOMEY IRRIG 70ML (MISCELLANEOUS) ×2
SYRINGE TOOMEY IRRIG 70ML (MISCELLANEOUS) IMPLANT
TOWEL GREEN STERILE FF (TOWEL DISPOSABLE) ×2 IMPLANT
TUBE CONNECTING 12X1/4 (SUCTIONS) IMPLANT
WATER STERILE IRR 3000ML UROMA (IV SOLUTION) ×2 IMPLANT

## 2021-07-18 NOTE — Progress Notes (Signed)
In the ED PROGRESS NOTE    Antonio [name]  AOZ:[id number] DOB: 01/15/1946 DOA: 07/13/2021 PCP: [name], MD   Brief Narrative:  This 75 years old male with PMH significant for prostate cancer treated 20 years ago, CAD s/p CABG , history of GI bleed, hypertension, type 2 diabetes presented in the ED  with gross hematuria.  Patient reports having trouble voiding and developed hematuria 4 days ago,  his primary care physician has referred him to urologist who placed a Foley catheter.  Patient reports now catheter stopped draining and urine is dribbling out around the tubing.  Patient seems uncomfortable but denies any fever chills or malaise. CT abdomen showed likely clots in the bladder. Urology is consulted,  started on IV ceftriaxone for UTI.  Urology recommended to continue CBI for now.  Patient continues to have hematuria despite being on CBI.  Urology has decided to do fulguration in OR today if we are unable to get him off by CBI.  Assessment & Plan:   Principal Problem:   Hematuria Active Problems:   Type II diabetes mellitus (HCC)   HTN (hypertension)   BPH (benign prostatic hyperplasia)   CAD (coronary artery disease)   Thrombocytopenia (HCC)  Gross hematuria: Patient presented with gross hematuria and passing clots in the catheter. Urology was consulted,  recommended to continue CBI. Continue antibiotics for UTI. Hold Effient and aspirin for now. Monitor H&H.  Hemoglobin down from 11.2->9.8 > 9.6>9.2>8.7 Urine is getting pinkish.  Urine culture contaminated. Patient continues to have hematuria despite being on CBI. Urology recommended to do fulguration in the OR today if we are unable to get him off the CBI.  Sepsis secondary to UTI: Patient presented with tachypnea, leukocytosis, Hypotension, lactic acid 2.7, UTI. CT scan shows blood clots in the catheter, no stones or hydronephrosis Patient has received IV fluids as per sepsis protocol. Continue  ceftriaxone. Blood cultures no growth so far, urine cultures contaminated. Sepsis physiology has resolved.  CAD history of CABG: Hold aspirin and Effient , resume when bleeding improves. Continue metoprolol , Crestor, Imdur.  Type 2 diabetes : Continue sliding scale.  Thrombocytopenia: Platelet count stable.   DVT prophylaxis: SCDs Code Status: Full code Family Communication: No family at bedside Disposition Plan:    Status is: Inpatient  Remains inpatient appropriate because:Inpatient level of care appropriate due to severity of illness  Dispo: The patient is from: Home              Anticipated d/c is to: Home              Patient currently is not medically stable to d/c.   Difficult to place patient No  Consultants:  Urology  Procedures: CBI   antimicrobials:  Anti-infectives (From admission, onward)    Start     Dose/Rate Route Frequency Ordered Stop   07/13/21 2100  ceFEPIme (MAXIPIME) 2 g in sodium chloride 0.9 % 100 mL IVPB  Status:  Discontinued        2 g 200 mL/hr over 30 Minutes Intravenous Every 12 hours 07/13/21 1249 07/13/21 1550   07/13/21 2100  cefTRIAXone (ROCEPHIN) 1 g in sodium chloride 0.9 % 100 mL IVPB        1 g 200 mL/hr over 30 Minutes Intravenous Every 24 hours 07/13/21 1550     07/13/21 1230  ceFEPIme (MAXIPIME) 2 g in sodium chloride 0.9 % 100 mL IVPB        2 g 200 mL/hr over  30 Minutes Intravenous  Once 07/13/21 1229 07/13/21 1349       Subjective: Patient was seen and examined at bedside.  Overnight events noted.   Urine continues to remain bloody despite being on CBI. Patient reports intermittent bladder discomfort,  Patient is anticipating procedure, Fulguration in the OR today.  Objective: Vitals:   07/17/21 0614 07/17/21 1754 07/17/21 2109 07/18/21 0521  BP: 136/88 106/63 112/69 127/78  Pulse: 72 73 67 65  Resp: 16 19 18 18   Temp: 98 F (36.7 C) 99.1 F (37.3 C) 98.2 F (36.8 C) 98.3 F (36.8 C)  TempSrc: Oral Oral  Oral   SpO2: 99% 98% 98% 100%  Weight:      Height:        Intake/Output Summary (Last 24 hours) at 07/18/2021 1202 Last data filed at 07/18/2021 1125 Gross per 24 hour  Intake 7779.84 ml  Output 9850 ml  Net -2070.16 ml   Filed Weights   07/13/21 1203  Weight: 80.7 kg    Examination:  General exam: Appears comfortable, not in any acute distress, deconditioned. Respiratory system: Clear to auscultation bilaterally, RR 12 Cardiovascular system: S1-S2 heard, regular rate and rhythm, no murmur. Gastrointestinal system: Abdomen is soft, nontender, nondistended, BS+ Central nervous system: Alert and oriented x 3. No focal neurological deficits. Genitourinary: Urine is still bloody in the Foley catheter, reports genital discomfort.   Extremities: No edema, no cyanosis, no clubbing. Skin: No rashes, lesions or ulcers Psychiatry: Judgement and insight appear normal. Mood & affect appropriate.     Data Reviewed: I have personally reviewed following labs and imaging studies  CBC: Recent Labs  Lab 07/13/21 1206 07/13/21 1218 07/15/21 0232 07/15/21 1654 07/16/21 0138 07/17/21 0435 07/18/21 0256 07/18/21 0747  WBC 11.6*   < > 8.2  --  7.1 6.2 5.9 6.6  NEUTROABS 9.7*  --   --   --   --   --   --   --   HGB 11.9*   < > 9.8* 10.0* 9.6* 9.2* 7.6* 8.3*  HCT 37.2*   < > 29.9* 31.5* 29.9* 28.4* 23.3* 25.2*  MCV 85.9   < > 83.8  --  84.2 84.5 83.8 84.3  PLT 139*   < > 107*  --  122* 146* 139* 146*   < > = values in this interval not displayed.   Basic Metabolic Panel: Recent Labs  Lab 07/13/21 1206 07/13/21 1218 07/14/21 0350 07/15/21 0232 07/16/21 0138 07/17/21 0435  NA 137 139 137 136 138 137  K 3.8 3.7 3.4* 4.1 4.1 3.6  CL 107 105 106 105 107 103  CO2 21*  --  23 22 26 25   GLUCOSE 188* 185* 94 174* 143* 127*  BUN 16 17 12 12 10 9   CREATININE 1.54* 1.40* 1.12 1.10 1.05 1.05  CALCIUM 8.8*  --  8.5* 8.3* 8.6* 8.4*  MG  --   --   --   --   --  1.7  PHOS  --   --   --    --   --  3.2   GFR: Estimated Creatinine Clearance: 60.5 mL/min (by C-G formula based on SCr of 1.05 mg/dL). Liver Function Tests: Recent Labs  Lab 07/13/21 1206  AST 19  ALT 15  ALKPHOS 51  BILITOT 0.9  PROT 6.8  ALBUMIN 3.5   No results for input(s): LIPASE, AMYLASE in the last 168 hours. No results for input(s): AMMONIA in the last 168 hours. Coagulation Profile:  No results for input(s): INR, PROTIME in the last 168 hours. Cardiac Enzymes: No results for input(s): CKTOTAL, CKMB, CKMBINDEX, TROPONINI in the last 168 hours. BNP (last 3 results) No results for input(s): PROBNP in the last 8760 hours. HbA1C: No results for input(s): HGBA1C in the last 72 hours. CBG: Recent Labs  Lab 07/17/21 1154 07/17/21 1656 07/17/21 2213 07/18/21 0607 07/18/21 1034  GLUCAP 229* 120* 149* 121* 93   Lipid Profile: No results for input(s): CHOL, HDL, LDLCALC, TRIG, CHOLHDL, LDLDIRECT in the last 72 hours. Thyroid Function Tests: No results for input(s): TSH, T4TOTAL, FREET4, T3FREE, THYROIDAB in the last 72 hours. Anemia Panel: No results for input(s): VITAMINB12, FOLATE, FERRITIN, TIBC, IRON, RETICCTPCT in the last 72 hours. Sepsis Labs: Recent Labs  Lab 07/13/21 1300 07/13/21 1430  LATICACIDVEN 2.8* 2.4*    Recent Results (from the past 240 hour(s))  Culture, blood (routine x 2)     Status: None   Collection Time: 07/13/21  1:00 PM   Specimen: BLOOD RIGHT ARM  Result Value Ref Range Status   Specimen Description BLOOD RIGHT ARM  Final   Special Requests   Final    BOTTLES DRAWN AEROBIC AND ANAEROBIC Blood Culture results may not be optimal due to an inadequate volume of blood received in culture bottles   Culture   Final    NO GROWTH 5 DAYS Performed at [facility], [place] [address], [place], [place] [address]    Report Status 07/18/2021 FINAL  Final  Urine Culture     Status: Abnormal   Collection Time: 07/13/21  1:00 PM   Specimen: Urine, Catheterized  Result  Value Ref Range Status   Specimen Description URINE, CATHETERIZED  Final   Special Requests   Final    NONE Performed at  [facility], [place] [address], [place], [place] [address]    Culture MULTIPLE SPECIES PRESENT, SUGGEST RECOLLECTION (A)  Final   Report Status 07/14/2021 FINAL  Final  Culture, blood (routine x 2)     Status: None   Collection Time: 07/13/21  1:08 PM   Specimen: BLOOD LEFT ARM  Result Value Ref Range Status   Specimen Description BLOOD LEFT ARM  Final   Special Requests   Final    BOTTLES DRAWN AEROBIC AND ANAEROBIC Blood Culture adequate volume   Culture   Final    NO GROWTH 5 DAYS Performed at [facility], [place] [address], [place], [place] [address]    Report Status 07/18/2021 FINAL  Final  SARS CORONAVIRUS 2 (TAT 6-24 HRS) Nasopharyngeal Nasopharyngeal Swab     Status: None   Collection Time: 07/13/21 11:09 PM   Specimen: Nasopharyngeal Swab  Result Value Ref Range Status   SARS Coronavirus 2 NEGATIVE NEGATIVE Final    Comment: (NOTE) SARS-CoV-2 target nucleic acids are NOT DETECTED.  The SARS-CoV-2 RNA is generally detectable in upper and lower respiratory specimens during the acute phase of infection. Negative results do not preclude SARS-CoV-2 infection, do not rule out co-infections with other pathogens, and should not be used as the sole basis for treatment or other patient management decisions. Negative results must be combined with clinical observations, patient history, and epidemiological information. The expected result is Negative.  Fact Sheet for Patients: SugarRoll.be  Fact Sheet for Healthcare Providers: https://www.woods-mathews.com/  This test is not yet approved or cleared by the [place] FDA and  has been authorized for detection and/or diagnosis of SARS-CoV-2 by FDA under an Emergency  Use Authorization (EUA). This EUA will remain  in effect (meaning this test can be used) for  the duration of the COVID-19 declaration under Se ction 564(b)(1) of the Act, 21 U.S.C. section 360bbb-3(b)(1), unless the authorization is terminated or revoked sooner.  Performed at [facility], [place] [address], [place], [place] [address]   Surgical pcr screen     Status: None   Collection Time: 07/17/21 10:19 PM   Specimen: Nasal Mucosa; Nasal Swab  Result Value Ref Range Status   MRSA, PCR NEGATIVE NEGATIVE Final   Staphylococcus aureus NEGATIVE NEGATIVE Final    Comment: (NOTE) The Xpert SA Assay (FDA approved for NASAL specimens in patients 35 years of age and older), is one component of a comprehensive surveillance program. It is not intended to diagnose infection nor to guide or monitor treatment. Performed at [facility], [place] [address] [place], [place] [address]     Radiology Studies: No results found.  Scheduled Meds:  Chlorhexidine Gluconate Cloth  6 each Topical Daily   finasteride  5 mg Oral Daily   insulin aspart  0-15 Units Subcutaneous TID WC   isosorbide mononitrate  90 mg Oral q morning   metoprolol succinate  50 mg Oral Daily   rosuvastatin  40 mg Oral Daily   Continuous Infusions:  cefTRIAXone (ROCEPHIN)  IV 1 g (07/17/21 2114)   lactated ringers 125 mL/hr at 07/18/21 1125   sodium chloride irrigation       LOS: 5 days    Time spent: 25 mins    Lacy Taglieri, MD [facility]   If 7PM-7AM, please contact night-coverage

## 2021-07-18 NOTE — Anesthesia Postprocedure Evaluation (Signed)
Anesthesia Post Note  Patient: Antonio [name]  Procedure(s) Performed: CYSTOSCOPY BLOOD CLOT EVACUATION AND FULGURATION     Patient location during evaluation: PACU Anesthesia Type: General Level of consciousness: awake and alert Pain management: pain level controlled Vital Signs Assessment: post-procedure vital signs reviewed and stable Respiratory status: spontaneous breathing, nonlabored ventilation, respiratory function stable and patient connected to nasal cannula oxygen Cardiovascular status: blood pressure returned to baseline and stable Postop Assessment: no apparent nausea or vomiting Anesthetic complications: no   No notable events documented.  Last Vitals:  Vitals:   07/18/21 1736 07/18/21 1745  BP:  (!) 174/87  Pulse: 74 76  Resp: (!) 7 12  Temp:  37.1 C  SpO2: 94% 95%    Last Pain:  Vitals:   07/18/21 1745  TempSrc:   PainSc: Asleep                 Tiajuana Amass

## 2021-07-18 NOTE — Anesthesia Procedure Notes (Signed)
Procedure Name: LMA Insertion Date/Time: 07/18/2021 4:11 PM Performed by: Lance Coon, CRNA Pre-anesthesia Checklist: Patient identified, Emergency Drugs available, Suction available, Patient being monitored and Timeout performed Patient Re-evaluated:Patient Re-evaluated prior to induction Oxygen Delivery Method: Circle system utilized Preoxygenation: Pre-oxygenation with 100% oxygen Induction Type: IV induction LMA: LMA inserted LMA Size: 4.0 Number of attempts: 1 Placement Confirmation: ETT inserted through vocal cords under direct vision, positive ETCO2 and breath sounds checked- equal and bilateral Tube secured with: Tape Dental Injury: Teeth and Oropharynx as per pre-operative assessment

## 2021-07-18 NOTE — Progress Notes (Signed)
Able to pass out blood clots after flushes and CBI . Seen by MD at bedside and confirmed schedule at 1455 to OR.

## 2021-07-18 NOTE — Interval H&P Note (Signed)
History and Physical Interval Note:  07/18/2021 3:37 PM  Antonio [name]  has presented today for surgery, with the diagnosis of clots.  The various methods of treatment have been discussed with the patient and family. After consideration of risks, benefits and other options for treatment, the patient has consented to  Procedure(s): CYSTOSCOPY BLOOD CLOT EVACUATION AND FULGURATION (N/A) as a surgical intervention.  The patient's history has been reviewed, patient examined, no change in status, stable for surgery.  I have reviewed the patient's chart and labs.  Questions were answered to the patient's satisfaction.     Ardis Hughs

## 2021-07-18 NOTE — Care Management Important Message (Signed)
Important Message  Patient Details  Name: Antonio [name] MRN: [id number] Date of Birth: 1946-09-18   Medicare Important Message Given:  Yes     [name] 07/18/2021, 7:35 AM

## 2021-07-18 NOTE — Progress Notes (Signed)
Results for Antonio [name], Antonio [name] (MRN [id number]) as of 07/18/2021 04:41  Ref. Range 07/16/2021 11:27 07/16/2021 16:39 07/17/2021 01:37 07/17/2021 04:35 07/17/2021 06:17 07/17/2021 11:54 07/17/2021 16:56 07/17/2021 22:13 07/17/2021 22:19 07/18/2021 02:56  Hemoglobin Latest Ref Range: 13.0 - 17.0 g/dL    9.2 (L)      7.6 (L)  MD on call paged.

## 2021-07-18 NOTE — Progress Notes (Signed)
Day of Surgery Subjective: Overnight the continuous bladder irrigation was stopped, and his urine subsequently turned dark again and he was passing clots.  Throughout the day he was having pain and discomfort from clot retention.  Objective: Vital signs in last 24 hours: Temp:  [98.2 F (36.8 C)-99.1 F (37.3 C)] 98.8 F (37.1 C) (10/05 1212) Pulse Rate:  [65-73] 68 (10/05 1212) Resp:  [18-20] 20 (10/05 1212) BP: (106-135)/(63-78) 135/73 (10/05 1212) SpO2:  [98 %-100 %] 100 % (10/05 1212)  Intake/Output from previous day: 10/04 0701 - 10/05 0700 In: 10524.3 [P.O.:960; I.V.:2864.3; IV Piggyback:100] Out: 11650 [Urine:11650] Intake/Output this shift: Total I/O In: 1004.1 [I.V.:804.1; Other:200] Out: 4800 [Urine:4800]  Physical Exam:  General:alert, cooperative, and appears stated age GI: soft, non tender, normal bowel sounds, no palpable masses, no organomegaly, no inguinal hernia Male genitalia: not done Penis: normal, no lesions Extremities: extremities normal, atraumatic, no cyanosis or edema  Lab Results: Recent Labs    07/17/21 0435 07/18/21 0256 07/18/21 0747  HGB 9.2* 7.6* 8.3*  HCT 28.4* 23.3* 25.2*   BMET Recent Labs    07/16/21 0138 07/17/21 0435  NA 138 137  K 4.1 3.6  CL 107 103  CO2 26 25  GLUCOSE 143* 127*  BUN 10 9  CREATININE 1.05 1.05  CALCIUM 8.6* 8.4*   No results for input(s): LABPT, INR in the last 72 hours. No results for input(s): LABURIN in the last 72 hours. Results for orders placed or performed during the hospital encounter of 07/13/21  Culture, blood (routine x 2)     Status: None   Collection Time: 07/13/21  1:00 PM   Specimen: BLOOD RIGHT ARM  Result Value Ref Range Status   Specimen Description BLOOD RIGHT ARM  Final   Special Requests   Final    BOTTLES DRAWN AEROBIC AND ANAEROBIC Blood Culture results may not be optimal due to an inadequate volume of blood received in culture bottles   Culture   Final    NO GROWTH 5  DAYS Performed at [facility], [place] [address] [place], [place] [address]    Report Status 07/18/2021 FINAL  Final  Urine Culture     Status: Abnormal   Collection Time: 07/13/21  1:00 PM   Specimen: Urine, Catheterized  Result Value Ref Range Status   Specimen Description URINE, CATHETERIZED  Final   Special Requests   Final    NONE Performed at [facility], [place] [address], [place], [place] [address]    Culture MULTIPLE SPECIES PRESENT, SUGGEST RECOLLECTION (A)  Final   Report Status 07/14/2021 FINAL  Final  Culture, blood (routine x 2)     Status: None   Collection Time: 07/13/21  1:08 PM   Specimen: BLOOD LEFT ARM  Result Value Ref Range Status   Specimen Description BLOOD LEFT ARM  Final   Special Requests   Final    BOTTLES DRAWN AEROBIC AND ANAEROBIC Blood Culture adequate volume   Culture   Final    NO GROWTH 5 DAYS Performed at [facility], [place] [address] [place], [place] [address]    Report Status 07/18/2021 FINAL  Final  SARS CORONAVIRUS 2 (TAT 6-24 HRS) Nasopharyngeal Nasopharyngeal Swab     Status: None   Collection Time: 07/13/21 11:09 PM   Specimen: Nasopharyngeal Swab  Result Value Ref Range Status   SARS Coronavirus 2 NEGATIVE NEGATIVE Final    Comment: (NOTE) SARS-CoV-2 target nucleic acids are NOT DETECTED.  The SARS-CoV-2 RNA is generally detectable in upper and lower respiratory specimens during the acute phase of infection. Negative results do not preclude SARS-CoV-2 infection, do not rule out co-infections with other pathogens, and should not be used as the sole basis for treatment or other patient management decisions. Negative results must be combined with clinical observations, patient history, and epidemiological information. The expected result is Negative.  Fact Sheet for Patients: SugarRoll.be  Fact Sheet for Healthcare Providers: https://www.woods-mathews.com/  This test is  not yet approved or cleared by the [place] FDA and  has been authorized for detection and/or diagnosis of SARS-CoV-2 by FDA under an Emergency Use Authorization (EUA). This EUA will remain  in effect (meaning this test can be used) for the duration of the COVID-19 declaration under Se ction 564(b)(1) of the Act, 21 U.S.C. section 360bbb-3(b)(1), unless the authorization is terminated or revoked sooner.  Performed at [facility], [place] [address] [place], [place] [address]   Surgical pcr screen     Status: None   Collection Time: 07/17/21 10:19 PM   Specimen: Nasal Mucosa; Nasal Swab  Result Value Ref Range Status   MRSA, PCR NEGATIVE NEGATIVE Final   Staphylococcus aureus NEGATIVE NEGATIVE Final    Comment: (NOTE) The Xpert SA Assay (FDA approved for NASAL specimens in patients 9 years of age and older), is one component of a comprehensive surveillance program. It is not intended to diagnose infection nor to guide or monitor treatment. Performed at [facility], [place] [address] [place], [place] [address]     Studies/Results: No results found.  Assessment/Plan: 74yo with gross Hematuria Plan to take the patient to the operating room this afternoon for clot evacuation and fulguration of any areas of bleeding.   LOS: 5 days   Ardis Hughs 07/18/2021, 3:35 PM

## 2021-07-18 NOTE — Op Note (Signed)
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  He had a trabeculated bladder with cellules and trigonal region.  He had catheter associated changes in his bladder.  EBL: Minimal  Specimens: None  Indication: Antonio [name] is a 75 y.o. patient with history of prostate cancer who was treated with radiation.  He initially presented to the emergency department and urinary retention.  CIC was performed 600 cc were drained.  The patient subsequently was discharged home.  He then presented to our clinic having voided.  We placed a Foley catheter and sent him home.  At that time his urine was brown, but there was no evidence of active bleeding.  However that evening he then represented to the urgent care and subsequently to the emergency department and clot retention.  After conservative management in the hospital with a three-way Foley catheter and continuous bladder irrigation making no significant progress we opted to proceed to the operating room.  After reviewing the management options for treatment, he elected to proceed with the above surgical procedure(s). We have discussed the potential benefits and risks of the procedure, side effects of the proposed treatment, the likelihood of the patient achieving the goals of the procedure, and any potential problems that might occur during the procedure or recuperation. Informed consent has been obtained.  Description of procedure:  The patient was taken to the operating room and general anesthesia was induced.  The patient was placed in the dorsal lithotomy position, prepped and draped in the usual sterile fashion, and  preoperative antibiotics were administered. A preoperative time-out was performed.   21 French 30 degree cystoscope was gently passed to the patient's urethra and into the bladder.  The prostate was noted to be friable and bleeding.  There were radiation changes to the membranous urethra as well.  Cystoscopy demonstrated the above findings.  I then used the Bugbee and fulgurated the bladder neck and prostatic urethra.  It was quite friable, but it did appear to have stopped by the time we finished.  I then placed a 22 French three-way Foley catheter into the patient's bladder and the E flux was clear.  As such, he kept his inflow port of the three-way catheter and terminate the case.  He is subsequent extubated and returned to PACU in good condition.  Ardis Hughs, M.D.

## 2021-07-18 NOTE — Progress Notes (Signed)
Patient complaining of suprapubic pain and passing out some blood clots with reddish urine. Notified MD

## 2021-07-18 NOTE — Plan of Care (Signed)

## 2021-07-18 NOTE — Anesthesia Preprocedure Evaluation (Signed)
Anesthesia Evaluation  Patient identified by MRN, date of birth, ID band Patient awake    Reviewed: Allergy & Precautions, NPO status , Patient's Chart, lab work & pertinent test results  Airway Mallampati: II  TM Distance: >3 FB Neck ROM: Full    Dental  (+) Dental Advisory Given   Pulmonary former smoker,    breath sounds clear to auscultation       Cardiovascular hypertension, Pt. on medications and Pt. on home beta blockers + CAD, + Cardiac Stents and + CABG   Rhythm:Regular Rate:Normal     Neuro/Psych negative neurological ROS     GI/Hepatic Neg liver ROS, GERD  ,  Endo/Other  diabetes, Type 2, Oral Hypoglycemic Agents, Insulin Dependent  Renal/GU Renal disease     Musculoskeletal  (+) Arthritis ,   Abdominal   Peds  Hematology  (+) anemia ,   Anesthesia Other Findings   Reproductive/Obstetrics                             Lab Results  Component Value Date   WBC 6.6 07/18/2021   HGB 8.3 (L) 07/18/2021   HCT 25.2 (L) 07/18/2021   MCV 84.3 07/18/2021   PLT 146 (L) 07/18/2021   Lab Results  Component Value Date   CREATININE 1.05 07/17/2021   BUN 9 07/17/2021   NA 137 07/17/2021   K 3.6 07/17/2021   CL 103 07/17/2021   CO2 25 07/17/2021    Anesthesia Physical Anesthesia Plan  ASA: 3  Anesthesia Plan: General   Post-op Pain Management:    Induction: Intravenous  PONV Risk Score and Plan: 2 and Dexamethasone, Ondansetron and Treatment may vary due to age or medical condition  Airway Management Planned: LMA  Additional Equipment:   Intra-op Plan:   Post-operative Plan: Extubation in OR  Informed Consent: I have reviewed the patients History and Physical, chart, labs and discussed the procedure including the risks, benefits and alternatives for the proposed anesthesia with the patient or authorized representative who has indicated his/her understanding and  acceptance.     Dental advisory given  Plan Discussed with: CRNA  Anesthesia Plan Comments:         Anesthesia Quick Evaluation

## 2021-07-18 NOTE — H&P (View-Only) (Signed)
Day of Surgery Subjective: Overnight the continuous bladder irrigation was stopped, and his urine subsequently turned dark again and he was passing clots.  Throughout the day he was having pain and discomfort from clot retention.  Objective: Vital signs in last 24 hours: Temp:  [98.2 F (36.8 C)-99.1 F (37.3 C)] 98.8 F (37.1 C) (10/05 1212) Pulse Rate:  [65-73] 68 (10/05 1212) Resp:  [18-20] 20 (10/05 1212) BP: (106-135)/(63-78) 135/73 (10/05 1212) SpO2:  [98 %-100 %] 100 % (10/05 1212)  Intake/Output from previous day: 10/04 0701 - 10/05 0700 In: 10524.3 [P.O.:960; I.V.:2864.3; IV Piggyback:100] Out: 11650 [Urine:11650] Intake/Output this shift: Total I/O In: 1004.1 [I.V.:804.1; Other:200] Out: 4800 [Urine:4800]  Physical Exam:  General:alert, cooperative, and appears stated age GI: soft, non tender, normal bowel sounds, no palpable masses, no organomegaly, no inguinal hernia Male genitalia: not done Penis: normal, no lesions Extremities: extremities normal, atraumatic, no cyanosis or edema  Lab Results: Recent Labs    07/17/21 0435 07/18/21 0256 07/18/21 0747  HGB 9.2* 7.6* 8.3*  HCT 28.4* 23.3* 25.2*   BMET Recent Labs    07/16/21 0138 07/17/21 0435  NA 138 137  K 4.1 3.6  CL 107 103  CO2 26 25  GLUCOSE 143* 127*  BUN 10 9  CREATININE 1.05 1.05  CALCIUM 8.6* 8.4*   No results for input(s): LABPT, INR in the last 72 hours. No results for input(s): LABURIN in the last 72 hours. Results for orders placed or performed during the hospital encounter of 07/13/21  Culture, blood (routine x 2)     Status: None   Collection Time: 07/13/21  1:00 PM   Specimen: BLOOD RIGHT ARM  Result Value Ref Range Status   Specimen Description BLOOD RIGHT ARM  Final   Special Requests   Final    BOTTLES DRAWN AEROBIC AND ANAEROBIC Blood Culture results may not be optimal due to an inadequate volume of blood received in culture bottles   Culture   Final    NO GROWTH 5  DAYS Performed at [facility], [place] [address] [place], [place] [address]    Report Status 07/18/2021 FINAL  Final  Urine Culture     Status: Abnormal   Collection Time: 07/13/21  1:00 PM   Specimen: Urine, Catheterized  Result Value Ref Range Status   Specimen Description URINE, CATHETERIZED  Final   Special Requests   Final    NONE Performed at [facility], [place] [address] [place], [place] [address]    Culture MULTIPLE SPECIES PRESENT, SUGGEST RECOLLECTION (A)  Final   Report Status 07/14/2021 FINAL  Final  Culture, blood (routine x 2)     Status: None   Collection Time: 07/13/21  1:08 PM   Specimen: BLOOD LEFT ARM  Result Value Ref Range Status   Specimen Description BLOOD LEFT ARM  Final   Special Requests   Final    BOTTLES DRAWN AEROBIC AND ANAEROBIC Blood Culture adequate volume   Culture   Final    NO GROWTH 5 DAYS Performed at [facility], [place] [address], [place], [place] [address]    Report Status 07/18/2021 FINAL  Final  SARS CORONAVIRUS 2 (TAT 6-24 HRS) Nasopharyngeal Nasopharyngeal Swab     Status: None   Collection Time: 07/13/21 11:09 PM   Specimen: Nasopharyngeal Swab  Result Value Ref Range Status   SARS Coronavirus 2 NEGATIVE NEGATIVE Final    Comment: (NOTE) SARS-CoV-2 target nucleic acids are NOT DETECTED.  The SARS-CoV-2 RNA is generally detectable in upper and lower respiratory specimens during the acute phase of infection. Negative results do not preclude SARS-CoV-2 infection, do not rule out co-infections with other pathogens, and should not be used as the sole basis for treatment or other patient management decisions. Negative results must be combined with clinical observations, patient history, and epidemiological information. The expected result is Negative.  Fact Sheet for Patients: SugarRoll.be  Fact Sheet for Healthcare Providers: https://www.woods-mathews.com/  This test is  not yet approved or cleared by the [place] FDA and  has been authorized for detection and/or diagnosis of SARS-CoV-2 by FDA under an Emergency Use Authorization (EUA). This EUA will remain  in effect (meaning this test can be used) for the duration of the COVID-19 declaration under Se ction 564(b)(1) of the Act, 21 U.S.C. section 360bbb-3(b)(1), unless the authorization is terminated or revoked sooner.  Performed at [facility], [place] [address], [place], [place] [address]   Surgical pcr screen     Status: None   Collection Time: 07/17/21 10:19 PM   Specimen: Nasal Mucosa; Nasal Swab  Result Value Ref Range Status   MRSA, PCR NEGATIVE NEGATIVE Final   Staphylococcus aureus NEGATIVE NEGATIVE Final    Comment: (NOTE) The Xpert SA Assay (FDA approved for NASAL specimens in patients 49 years of age and older), is one component of a comprehensive surveillance program. It is not intended to diagnose infection nor to guide or monitor treatment. Performed at [facility], [place] [address], [place], [place] [address]     Studies/Results: No results found.  Assessment/Plan: 75yo with gross Hematuria Plan to take the patient to the operating room this afternoon for clot evacuation and fulguration of any areas of bleeding.   LOS: 5 days   Ardis Hughs 07/18/2021, 3:35 PM

## 2021-07-18 NOTE — Transfer of Care (Signed)
Immediate Anesthesia Transfer of Care Note  Patient: Macai Sisneros Warshawsky  Procedure(s) Performed: CYSTOSCOPY BLOOD CLOT EVACUATION AND FULGURATION  Patient Location: PACU  Anesthesia Type:General  Level of Consciousness: drowsy and patient cooperative  Airway & Oxygen Therapy: Patient Spontanous Breathing  Post-op Assessment: Report given to RN and Post -op Vital signs reviewed and stable  Post vital signs: Reviewed and stable  Last Vitals:  Vitals Value Taken Time  BP 161/98 07/18/21 1701  Temp    Pulse    Resp 10 07/18/21 1702  SpO2    Vitals shown include unvalidated device data.  Last Pain:  Vitals:   07/18/21 1400  TempSrc:   PainSc: 10-Worst pain ever      Patients Stated Pain Goal: 0 (41/96/22 2979)  Complications: No notable events documented.

## 2021-07-18 NOTE — Progress Notes (Signed)
Patient taken to the operating room for fulguration of prostatic varices.  He has radiation induced cystitis and prostatic bleeding.  10 French three-way Foley catheter was placed but he was not started on CBI, as the urine was clear at the end of the case.  Hopefully this will continue to remain clear and he can be discharged home tomorrow with the catheter.

## 2021-07-19 ENCOUNTER — Encounter (HOSPITAL_COMMUNITY): Payer: Self-pay | Admitting: Urology

## 2021-07-19 DIAGNOSIS — N182 Chronic kidney disease, stage 2 (mild): Secondary | ICD-10-CM

## 2021-07-19 DIAGNOSIS — I25708 Atherosclerosis of coronary artery bypass graft(s), unspecified, with other forms of angina pectoris: Secondary | ICD-10-CM | POA: Diagnosis not present

## 2021-07-19 DIAGNOSIS — Z794 Long term (current) use of insulin: Secondary | ICD-10-CM

## 2021-07-19 DIAGNOSIS — R310 Gross hematuria: Secondary | ICD-10-CM

## 2021-07-19 DIAGNOSIS — E1122 Type 2 diabetes mellitus with diabetic chronic kidney disease: Secondary | ICD-10-CM

## 2021-07-19 DIAGNOSIS — I10 Essential (primary) hypertension: Secondary | ICD-10-CM

## 2021-07-19 LAB — CBC
HCT: 25.4 % — ABNORMAL LOW (ref 39.0–52.0)
Hemoglobin: 8.2 g/dL — ABNORMAL LOW (ref 13.0–17.0)
MCH: 27.3 pg (ref 26.0–34.0)
MCHC: 32.3 g/dL (ref 30.0–36.0)
MCV: 84.7 fL (ref 80.0–100.0)
Platelets: 179 10*3/uL (ref 150–400)
RBC: 3 MIL/uL — ABNORMAL LOW (ref 4.22–5.81)
RDW: 15.6 % — ABNORMAL HIGH (ref 11.5–15.5)
WBC: 8.2 10*3/uL (ref 4.0–10.5)
nRBC: 0 % (ref 0.0–0.2)

## 2021-07-19 LAB — BASIC METABOLIC PANEL
Anion gap: 9 (ref 5–15)
BUN: 12 mg/dL (ref 8–23)
CO2: 25 mmol/L (ref 22–32)
Calcium: 8.4 mg/dL — ABNORMAL LOW (ref 8.9–10.3)
Chloride: 101 mmol/L (ref 98–111)
Creatinine, Ser: 1.43 mg/dL — ABNORMAL HIGH (ref 0.61–1.24)
GFR, Estimated: 52 mL/min — ABNORMAL LOW (ref >60)
Glucose, Bld: 262 mg/dL — ABNORMAL HIGH (ref 70–99)
Potassium: 3.8 mmol/L (ref 3.5–5.1)
Sodium: 135 mmol/L (ref 135–145)

## 2021-07-19 LAB — GLUCOSE, CAPILLARY
Glucose-Capillary: 239 mg/dL — ABNORMAL HIGH (ref 70–99)
Glucose-Capillary: 284 mg/dL — ABNORMAL HIGH (ref 70–99)
Glucose-Capillary: 237 mg/dL — ABNORMAL HIGH (ref 70–99)
Glucose-Capillary: 174 mg/dL — ABNORMAL HIGH (ref 70–99)

## 2021-07-19 LAB — TROPONIN I (HIGH SENSITIVITY)
Troponin I (High Sensitivity): 7 ng/L (ref <18)
Troponin I (High Sensitivity): 8 ng/L (ref <18)

## 2021-07-19 MED ORDER — FINASTERIDE 5 MG PO TABS: 5.0000 mg | ORAL_TABLET | Freq: Every day | ORAL | 0 refills | Status: DC

## 2021-07-19 MED ORDER — AMLODIPINE BESYLATE 5 MG PO TABS
2.5000 mg | ORAL_TABLET | Freq: Every day | ORAL | Status: DC
  Administered 2021-07-19: 2.5 mg via ORAL
  Filled 2021-07-19 (×2): qty 1

## 2021-07-19 MED ORDER — PANTOPRAZOLE SODIUM 40 MG PO TBEC
40.0000 mg | DELAYED_RELEASE_TABLET | Freq: Once | ORAL | Status: AC
  Administered 2021-07-19: 40 mg via ORAL
  Filled 2021-07-19: qty 1

## 2021-07-19 MED ORDER — NITROGLYCERIN 0.4 MG SL SUBL
0.4000 mg | SUBLINGUAL_TABLET | SUBLINGUAL | Status: DC | PRN
  Administered 2021-07-19 (×3): 0.4 mg via SUBLINGUAL
  Filled 2021-07-19 (×3): qty 1

## 2021-07-19 NOTE — Progress Notes (Signed)
In the ED PROGRESS NOTE    Antonio [name]  YIR:[id number] DOB: [birth year] DOA: 07/13/2021 PCP: [name], MD   Brief Narrative:  This 75 years old male with PMH significant for prostate cancer treated 20 years ago, CAD s/p CABG , history of GI bleed, hypertension, type 2 diabetes presented in the ED  with gross hematuria.  Patient reports having trouble voiding and developed hematuria 4 days ago,  his primary care physician has referred him to urologist who placed a Foley catheter.  Patient reports now catheter stopped draining and urine is dribbling out around the tubing.  Patient seems uncomfortable but denies any fever, chills or malaise. CT abdomen showed likely clots in the bladder. Urology is consulted,  started on IV ceftriaxone for UTI.  Urology recommended to continue CBI for now. Patient continues to have hematuria despite being on CBI. Patient underwent fulguration with resolution of hematuria. Patient was discharged. He has developed chest pain before discharge,  improved with nitroglycerin.  Cardiology consulted,  awaiting recommendation.  Assessment & Plan:   Principal Problem:   Hematuria Active Problems:   Type II diabetes mellitus (HCC)   HTN (hypertension)   BPH (benign prostatic hyperplasia)   CAD (coronary artery disease)   Thrombocytopenia (HCC)  Gross hematuria: Patient presented with gross hematuria and passing clots in the catheter. Urology was consulted,  recommended to continue CBI. Continue antibiotics for UTI. Hold Effient and aspirin for now. Monitor H&H.  Hemoglobin down from 11.2->9.8 > 9.6>9.2>8.7 Patient continues to have hematuria despite being on CBI. Patient underwent fulguration with resolution of hematuria. Urologist recommended discharge with catheter and follow-up in 1 week.  Sepsis secondary to UTI: Patient presented with tachypnea, leukocytosis, Hypotension, lactic acid 2.7, UTI. CT scan shows blood clots in the catheter, no stones or  hydronephrosis Patient has received IV fluids as per sepsis protocol. Continue ceftriaxone. Blood cultures no growth so far, urine cultures contaminated. Sepsis physiology has resolved.  Chest pain /  CAD history of CABG: Aspirin and Effient was kept on hold due to hematuria. Continue metoprolol , Crestor, Imdur. Patient has developed chest pain before discharge. Troponin negative, EKG shows some changes Cardiology consulted, discharge held.  Type 2 diabetes : Continue sliding scale.  Thrombocytopenia: Platelet count stable.   DVT prophylaxis: SCDs Code Status: Full code Family Communication: No family at bedside Disposition Plan:    Status is: Inpatient  Remains inpatient appropriate because:Inpatient level of care appropriate due to severity of illness  Dispo: The patient is from: Home              Anticipated d/c is to: Home              Patient currently is not medically stable to d/c.   Difficult to place patient No  Consultants:  Urology  Procedures: CBI   antimicrobials:  Anti-infectives (From admission, onward)    Start     Dose/Rate Route Frequency Ordered Stop   07/13/21 2100  ceFEPIme (MAXIPIME) 2 g in sodium chloride 0.9 % 100 mL IVPB  Status:  Discontinued        2 g 200 mL/hr over 30 Minutes Intravenous Every 12 hours 07/13/21 1249 07/13/21 1550   07/13/21 2100  cefTRIAXone (ROCEPHIN) 1 g in sodium chloride 0.9 % 100 mL IVPB        1 g 200 mL/hr over 30 Minutes Intravenous Every 24 hours 07/13/21 1550     07/13/21 1230  ceFEPIme (MAXIPIME) 2 g in sodium  chloride 0.9 % 100 mL IVPB        2 g 200 mL/hr over 30 Minutes Intravenous  Once 07/13/21 1229 07/13/21 1349       Subjective: Patient was seen and examined at bedside.  Overnight events noted.   Patient underwent fulguration with resolution of hematuria. Patient was discharged but then has developed chest pain.  Discharge is held  Objective: Vitals:   07/19/21 0856 07/19/21 1010 07/19/21  1029 07/19/21 1620  BP: (!) 120/59 126/73 140/74 122/67  Pulse: 81 79 82 75  Resp:  17    Temp:  98.7 F (37.1 C) 98.8 F (37.1 C) 98.9 F (37.2 C)  TempSrc:    Oral  SpO2:  95% 95% 98%  Weight:      Height:        Intake/Output Summary (Last 24 hours) at 07/19/2021 1630 Last data filed at 07/19/2021 1000 Gross per 24 hour  Intake 2389.53 ml  Output 1305 ml  Net 1084.53 ml   Filed Weights   07/13/21 1203  Weight: 80.7 kg    Examination:  General exam: Appears comfortable, not in any acute distress, deconditioned. Respiratory system: Clear to auscultation bilaterally, RR 12 Cardiovascular system: S1-S2 heard, regular rate and rhythm, no murmur. Gastrointestinal system: Abdomen is soft, nontender, nondistended, BS+ Central nervous system: Alert and oriented x 3. No focal neurological deficits. Genitourinary: Urine is getting clear.,  Denies urinary discomfort. Extremities: No edema, no cyanosis, no clubbing. Skin: No rashes, lesions or ulcers Psychiatry: Judgement and insight appear normal. Mood & affect appropriate.     Data Reviewed: I have personally reviewed following labs and imaging studies  CBC: Recent Labs  Lab 07/13/21 1206 07/13/21 1218 07/16/21 0138 07/17/21 0435 07/18/21 0256 07/18/21 0747 07/19/21 0324  WBC 11.6*   < > 7.1 6.2 5.9 6.6 8.2  NEUTROABS 9.7*  --   --   --   --   --   --   HGB 11.9*   < > 9.6* 9.2* 7.6* 8.3* 8.2*  HCT 37.2*   < > 29.9* 28.4* 23.3* 25.2* 25.4*  MCV 85.9   < > 84.2 84.5 83.8 84.3 84.7  PLT 139*   < > 122* 146* 139* 146* 179   < > = values in this interval not displayed.   Basic Metabolic Panel: Recent Labs  Lab 07/14/21 0350 07/15/21 0232 07/16/21 0138 07/17/21 0435 07/19/21 0324  NA 137 136 138 137 135  K 3.4* 4.1 4.1 3.6 3.8  CL 106 105 107 103 101  CO2 23 22 26 25 25   GLUCOSE 94 174* 143* 127* 262*  BUN 12 12 10 9 12   CREATININE 1.12 1.10 1.05 1.05 1.43*  CALCIUM 8.5* 8.3* 8.6* 8.4* 8.4*  MG  --   --    --  1.7  --   PHOS  --   --   --  3.2  --    GFR: Estimated Creatinine Clearance: 44.4 mL/min (A) (by C-G formula based on SCr of 1.43 mg/dL (H)). Liver Function Tests: Recent Labs  Lab 07/13/21 1206  AST 19  ALT 15  ALKPHOS 51  BILITOT 0.9  PROT 6.8  ALBUMIN 3.5   No results for input(s): LIPASE, AMYLASE in the last 168 hours. No results for input(s): AMMONIA in the last 168 hours. Coagulation Profile: No results for input(s): INR, PROTIME in the last 168 hours. Cardiac Enzymes: No results for input(s): CKTOTAL, CKMB, CKMBINDEX, TROPONINI in the last 168 hours. BNP (  last 3 results) No results for input(s): PROBNP in the last 8760 hours. HbA1C: No results for input(s): HGBA1C in the last 72 hours. CBG: Recent Labs  Lab 07/18/21 2225 07/18/21 2340 07/19/21 0647 07/19/21 1146 07/19/21 1620  GLUCAP 310* 282* 239* 284* 237*   Lipid Profile: No results for input(s): CHOL, HDL, LDLCALC, TRIG, CHOLHDL, LDLDIRECT in the last 72 hours. Thyroid Function Tests: No results for input(s): TSH, T4TOTAL, FREET4, T3FREE, THYROIDAB in the last 72 hours. Anemia Panel: No results for input(s): VITAMINB12, FOLATE, FERRITIN, TIBC, IRON, RETICCTPCT in the last 72 hours. Sepsis Labs: Recent Labs  Lab 07/13/21 1300 07/13/21 1430  LATICACIDVEN 2.8* 2.4*    Recent Results (from the past 240 hour(s))  Culture, blood (routine x 2)     Status: None   Collection Time: 07/13/21  1:00 PM   Specimen: BLOOD RIGHT ARM  Result Value Ref Range Status   Specimen Description BLOOD RIGHT ARM  Final   Special Requests   Final    BOTTLES DRAWN AEROBIC AND ANAEROBIC Blood Culture results may not be optimal due to an inadequate volume of blood received in culture bottles   Culture   Final    NO GROWTH 5 DAYS Performed at [facility], [place] [address], [place], [place] [address]    Report Status 07/18/2021 FINAL  Final  Urine Culture     Status: Abnormal   Collection Time: 07/13/21  1:00 PM    Specimen: Urine, Catheterized  Result Value Ref Range Status   Specimen Description URINE, CATHETERIZED  Final   Special Requests   Final    NONE Performed at [facility], [place] [address], [place], [place] [address]    Culture MULTIPLE SPECIES PRESENT, SUGGEST RECOLLECTION (A)  Final   Report Status 07/14/2021 FINAL  Final  Culture, blood (routine x 2)     Status: None   Collection Time: 07/13/21  1:08 PM   Specimen: BLOOD LEFT ARM  Result Value Ref Range Status   Specimen Description BLOOD LEFT ARM  Final   Special Requests   Final    BOTTLES DRAWN AEROBIC AND ANAEROBIC Blood Culture adequate volume   Culture   Final    NO GROWTH 5 DAYS Performed at [facility], [place] [address] [place], [place] [address]    Report Status 07/18/2021 FINAL  Final  SARS CORONAVIRUS 2 (TAT 6-24 HRS) Nasopharyngeal Nasopharyngeal Swab     Status: None   Collection Time: 07/13/21 11:09 PM   Specimen: Nasopharyngeal Swab  Result Value Ref Range Status   SARS Coronavirus 2 NEGATIVE NEGATIVE Final    Comment: (NOTE) SARS-CoV-2 target nucleic acids are NOT DETECTED.  The SARS-CoV-2 RNA is generally detectable in upper and lower respiratory specimens during the acute phase of infection. Negative results do not preclude SARS-CoV-2 infection, do not rule out co-infections with other pathogens, and should not be used as the sole basis for treatment or other patient management decisions. Negative results must be combined with clinical observations, patient history, and epidemiological information. The expected result is Negative.  Fact Sheet for Patients: SugarRoll.be  Fact Sheet for Healthcare Providers: https://www.woods-mathews.com/  This test is not yet approved or cleared by the [place] FDA and  has been authorized for detection and/or diagnosis of SARS-CoV-2 by FDA under an Emergency Use Authorization (EUA). This EUA will remain  in  effect (meaning this test can be used) for the duration of the COVID-19 declaration under Se ction  564(b)(1) of the Act, 21 U.S.C. section 360bbb-3(b)(1), unless the authorization is terminated or revoked sooner.  Performed at [facility], [place] [address], [place], [place] [address]   Surgical pcr screen     Status: None   Collection Time: 07/17/21 10:19 PM   Specimen: Nasal Mucosa; Nasal Swab  Result Value Ref Range Status   MRSA, PCR NEGATIVE NEGATIVE Final   Staphylococcus aureus NEGATIVE NEGATIVE Final    Comment: (NOTE) The Xpert SA Assay (FDA approved for NASAL specimens in patients 90 years of age and older), is one component of a comprehensive surveillance program. It is not intended to diagnose infection nor to guide or monitor treatment. Performed at [facility], [place] [address] [place], [place] [address]     Radiology Studies: No results found.  Scheduled Meds:  Chlorhexidine Gluconate Cloth  6 each Topical Daily   finasteride  5 mg Oral Daily   insulin aspart  0-15 Units Subcutaneous TID WC   isosorbide mononitrate  90 mg Oral q morning   metoprolol succinate  50 mg Oral Daily   rosuvastatin  40 mg Oral Daily   Continuous Infusions:  cefTRIAXone (ROCEPHIN)  IV 1 g (07/18/21 2011)   lactated ringers 125 mL/hr at 07/19/21 0117   sodium chloride irrigation       LOS: 6 days    Time spent: 25 mins    Bionca Mckey, MD [facility]   If 7PM-7AM, please contact night-coverage
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
## 2021-07-19 NOTE — Progress Notes (Signed)
Pt reported to RN that he ws experiencing chest pain, left side of his chest and felt lightheaded. Vitals 98.8 (O), 140/74, Pulse (82), 95 RA. EKG = Normal Sinus. Chest pain 5 out of 10. Nitro Administered per MD order. Will continue to monitor.

## 2021-07-19 NOTE — Progress Notes (Signed)
Inpatient Diabetes Program Recommendations  AACE/ADA: New Consensus Statement on Inpatient Glycemic Control (2015)  Target Ranges:  Prepandial:   less than 140 mg/dL      Peak postprandial:   less than 180 mg/dL (1-2 hours)      Critically ill patients:  140 - 180 mg/dL   Lab Results  Component Value Date   GLUCAP 284 (H) 07/19/2021   HGBA1C 7.6 (H) 12/28/2020    Review of Glycemic Control  Diabetes history: type 2 Outpatient Diabetes medications:Levemir 45 units if CBG>130 mg/dl, Metformin 1000 mg BID, Januvia 100 mg daily Current orders for Inpatient glycemic control: Novolog MODERATE correction scale TID  Inpatient Diabetes Program Recommendations:   Noted that blood sugars continue greater than 180 mg/dl. Noted that patient had Decadron yesterday in surgery. Recommend starting part of home basal insulin dose: Levemir 25 units daily and continue Novolog correction scale as ordered if blood sugars continue to be elevated. Titrate dosages as needed.   Harvel Ricks RN BSN CDE Diabetes Coordinator Pager: [phone]  8am-5pm

## 2021-07-19 NOTE — Progress Notes (Signed)
During patient rounding. This RN noticed pt's foley bag leaking from the bottom of the bag. Notified MD Dwyane Dee and MD Louis Meckel. MD-Herrick advised that I change the bag and only the bag. RN successfully changed the bag per MD recommendations. Pt tolerated well.

## 2021-07-19 NOTE — Discharge Instructions (Signed)
Advised to follow-up with primary care physician in 1 week. Advised to follow-up with urologist in 1 week. Advised to resume aspirin and Effient from today. Patient is being discharged with catheter.  Voiding trial in 1 week.

## 2021-07-19 NOTE — Progress Notes (Signed)
PT called out reporting he had 9/10 chest pain again and feeling lightheaded. Vitals obtained, EKG placed in chart, another dose of NItro administered. Discharge for today canceled. MD notified. Cardiology consult placed.

## 2021-07-19 NOTE — Progress Notes (Signed)
1 Day Post-Op Subjective: Prostatic bleeding fulgurated in OR. Very friable prostatic mucosa. Pink urine this AM - CBI not restarted No complaints from patient  Objective: Vital signs in last 24 hours: Temp:  [97.9 F (36.6 C)-98.8 F (37.1 C)] 97.9 F (36.6 C) (10/06 0500) Pulse Rate:  [68-89] 77 (10/06 0500) Resp:  [7-20] 18 (10/06 0500) BP: (114-182)/(71-98) 120/71 (10/06 0500) SpO2:  [94 %-100 %] 97 % (10/06 0500)  Intake/Output from previous day: 10/05 0701 - 10/06 0700 In: 3273.6 [I.V.:2873.6; IV Piggyback:200] Out: 6605 [Urine:6600; Blood:5] Intake/Output this shift: No intake/output data recorded.  Physical Exam:  General:alert, cooperative, and appears stated age GI: soft, non tender, normal bowel sounds, no palpable masses, no organomegaly, no inguinal hernia Male genitalia: not done Penis: normal, no lesions Extremities: extremities normal, atraumatic, no cyanosis or edema  Lab Results: Recent Labs    07/18/21 0256 07/18/21 0747 07/19/21 0324  HGB 7.6* 8.3* 8.2*  HCT 23.3* 25.2* 25.4*    BMET Recent Labs    07/17/21 0435 07/19/21 0324  NA 137 135  K 3.6 3.8  CL 103 101  CO2 25 25  GLUCOSE 127* 262*  BUN 9 12  CREATININE 1.05 1.43*  CALCIUM 8.4* 8.4*    No results for input(s): LABPT, INR in the last 72 hours. No results for input(s): LABURIN in the last 72 hours. Results for orders placed or performed during the hospital encounter of 07/13/21  Culture, blood (routine x 2)     Status: None   Collection Time: 07/13/21  1:00 PM   Specimen: BLOOD RIGHT ARM  Result Value Ref Range Status   Specimen Description BLOOD RIGHT ARM  Final   Special Requests   Final    BOTTLES DRAWN AEROBIC AND ANAEROBIC Blood Culture results may not be optimal due to an inadequate volume of blood received in culture bottles   Culture   Final    NO GROWTH 5 DAYS Performed at [facility], [place] [address], [place], [place] [address]    Report Status 07/18/2021  FINAL  Final  Urine Culture     Status: Abnormal   Collection Time: 07/13/21  1:00 PM   Specimen: Urine, Catheterized  Result Value Ref Range Status   Specimen Description URINE, CATHETERIZED  Final   Special Requests   Final    NONE Performed at [facility], [place] [address], [place], [place] [address]    Culture MULTIPLE SPECIES PRESENT, SUGGEST RECOLLECTION (A)  Final   Report Status 07/14/2021 FINAL  Final  Culture, blood (routine x 2)     Status: None   Collection Time: 07/13/21  1:08 PM   Specimen: BLOOD LEFT ARM  Result Value Ref Range Status   Specimen Description BLOOD LEFT ARM  Final   Special Requests   Final    BOTTLES DRAWN AEROBIC AND ANAEROBIC Blood Culture adequate volume   Culture   Final    NO GROWTH 5 DAYS Performed at [facility], [place] [address], [place], [place] [address]    Report Status 07/18/2021 FINAL  Final  SARS CORONAVIRUS 2 (TAT 6-24 HRS) Nasopharyngeal Nasopharyngeal Swab     Status: None   Collection Time: 07/13/21 11:09 PM   Specimen: Nasopharyngeal Swab  Result Value Ref Range Status   SARS Coronavirus 2 NEGATIVE NEGATIVE Final    Comment: (NOTE) SARS-CoV-2 target nucleic acids are NOT DETECTED.  The SARS-CoV-2 RNA is generally detectable in upper and lower respiratory specimens during  the acute phase of infection. Negative results do not preclude SARS-CoV-2 infection, do not rule out co-infections with other pathogens, and should not be used as the sole basis for treatment or other patient management decisions. Negative results must be combined with clinical observations, patient history, and epidemiological information. The expected result is Negative.  Fact Sheet for Patients: SugarRoll.be  Fact Sheet for Healthcare Providers: https://www.woods-mathews.com/  This test is not yet approved or cleared by the [place] FDA and  has been authorized for detection and/or diagnosis of  SARS-CoV-2 by FDA under an Emergency Use Authorization (EUA). This EUA will remain  in effect (meaning this test can be used) for the duration of the COVID-19 declaration under Se ction 564(b)(1) of the Act, 21 U.S.C. section 360bbb-3(b)(1), unless the authorization is terminated or revoked sooner.  Performed at [facility], [place] [address] [place], [place] [address]   Surgical pcr screen     Status: None   Collection Time: 07/17/21 10:19 PM   Specimen: Nasal Mucosa; Nasal Swab  Result Value Ref Range Status   MRSA, PCR NEGATIVE NEGATIVE Final   Staphylococcus aureus NEGATIVE NEGATIVE Final    Comment: (NOTE) The Xpert SA Assay (FDA approved for NASAL specimens in patients 30 years of age and older), is one component of a comprehensive surveillance program. It is not intended to diagnose infection nor to guide or monitor treatment. Performed at [facility], [place] [address], [place], [place] [address]     Studies/Results: No results found.  Assessment/Plan: 75yo with gross Hematuria Radiation induced  bleeding, very hard to manage, but ok to discharge with catheter.  We will remove it next week.  We will discuss hyperbaric oxygen therapy if this persist. Hold anticoagulation until catheter is removed next week.   Will arrange voiding trial next Wednesday.   LOS: 6 days   Ardis Hughs 07/19/2021, 7:05 AM

## 2021-07-20 ENCOUNTER — Inpatient Hospital Stay (HOSPITAL_COMMUNITY): Payer: Medicare Other

## 2021-07-20 DIAGNOSIS — I25708 Atherosclerosis of coronary artery bypass graft(s), unspecified, with other forms of angina pectoris: Secondary | ICD-10-CM | POA: Diagnosis not present

## 2021-07-20 DIAGNOSIS — R310 Gross hematuria: Secondary | ICD-10-CM | POA: Diagnosis not present

## 2021-07-20 DIAGNOSIS — R079 Chest pain, unspecified: Secondary | ICD-10-CM | POA: Diagnosis not present

## 2021-07-20 DIAGNOSIS — I10 Essential (primary) hypertension: Secondary | ICD-10-CM | POA: Diagnosis not present

## 2021-07-20 LAB — CBC
HCT: 23.1 % — ABNORMAL LOW (ref 39.0–52.0)
Hemoglobin: 7.5 g/dL — ABNORMAL LOW (ref 13.0–17.0)
MCH: 27.7 pg (ref 26.0–34.0)
MCHC: 32.5 g/dL (ref 30.0–36.0)
MCV: 85.2 fL (ref 80.0–100.0)
Platelets: 175 10*3/uL (ref 150–400)
RBC: 2.71 MIL/uL — ABNORMAL LOW (ref 4.22–5.81)
RDW: 15.9 % — ABNORMAL HIGH (ref 11.5–15.5)
WBC: 13.4 10*3/uL — ABNORMAL HIGH (ref 4.0–10.5)
nRBC: 0 % (ref 0.0–0.2)

## 2021-07-20 LAB — GLUCOSE, CAPILLARY
Glucose-Capillary: 156 mg/dL — ABNORMAL HIGH (ref 70–99)
Glucose-Capillary: 145 mg/dL — ABNORMAL HIGH (ref 70–99)
Glucose-Capillary: 162 mg/dL — ABNORMAL HIGH (ref 70–99)
Glucose-Capillary: 168 mg/dL — ABNORMAL HIGH (ref 70–99)

## 2021-07-20 LAB — ECHOCARDIOGRAM COMPLETE
Area-P 1/2: 4.93 cm2
Height: 67.5 in
S' Lateral: 3.3 cm
Weight: 2857.16 oz

## 2021-07-20 MED ORDER — SODIUM CHLORIDE 0.9 % IV BOLUS
500.0000 mL | Freq: Once | INTRAVENOUS | Status: AC
  Administered 2021-07-20: 500 mL via INTRAVENOUS

## 2021-07-20 MED ORDER — PERFLUTREN LIPID MICROSPHERE
1.0000 mL | INTRAVENOUS | Status: AC | PRN
  Administered 2021-07-20: 3 mL via INTRAVENOUS
  Filled 2021-07-20: qty 10

## 2021-07-20 MED ORDER — AMLODIPINE BESYLATE 5 MG PO TABS
5.0000 mg | ORAL_TABLET | Freq: Every day | ORAL | Status: DC
  Administered 2021-07-20 – 2021-07-22 (×3): 5 mg via ORAL
  Filled 2021-07-20 (×2): qty 1

## 2021-07-20 MED ORDER — TRAZODONE HCL 50 MG PO TABS
50.0000 mg | ORAL_TABLET | Freq: Every evening | ORAL | Status: DC | PRN
  Administered 2021-07-22: 50 mg via ORAL
  Filled 2021-07-20 (×2): qty 1

## 2021-07-20 NOTE — Progress Notes (Addendum)
Progress Note  Patient Name: Antonio [name] Date of Encounter: 07/20/2021  [facility] Cardiologist: Peter Martinique, MD   Subjective   Feeling OK.  Report dysuria.  No chest pain or shortness of breath.   Inpatient Medications    Scheduled Meds:  amLODipine  5 mg Oral Daily   Chlorhexidine Gluconate Cloth  6 each Topical Daily   finasteride  5 mg Oral Daily   insulin aspart  0-15 Units Subcutaneous TID WC   isosorbide mononitrate  90 mg Oral q morning   metoprolol succinate  50 mg Oral Daily   rosuvastatin  40 mg Oral Daily   Continuous Infusions:  cefTRIAXone (ROCEPHIN)  IV Stopped (07/19/21 2225)   lactated ringers Stopped (07/20/21 6010)   sodium chloride irrigation     PRN Meds: acetaminophen **OR** acetaminophen, opium-belladonna, nitroGLYCERIN, traMADol   Vital Signs    Vitals:   07/19/21 1620 07/19/21 2112 07/20/21 0712 07/20/21 0948  BP: 122/67 126/78 (!) 162/88 131/76  Pulse: 75 72 73 70  Resp:  18 18 16   Temp: 98.9 F (37.2 C) 98.7 F (37.1 C) 98 F (36.7 C) 98.7 F (37.1 C)  TempSrc: Oral Oral Oral Oral  SpO2: 98% 95% 95% 95%  Weight:  81 kg    Height:        Intake/Output Summary (Last 24 hours) at 07/20/2021 1022 Last data filed at 07/20/2021 0900 Gross per 24 hour  Intake 3053.03 ml  Output 2775 ml  Net 278.03 ml   Last 3 Weights 07/19/2021 07/13/2021 02/19/2021  Weight (lbs) 178 lb 9.2 oz 178 lb 177 lb 6.4 oz  Weight (kg) 81 kg 80.74 kg 80.468 kg      Telemetry    Sinus rhythm.  - Personally Reviewed  ECG    N/a - Personally Reviewed  Physical Exam   GEN: No acute distress.   Neck: No JVD Cardiac: RRR, no murmurs, rubs, or gallops.  Respiratory: Clear to auscultation bilaterally. GI: Soft, nontender, non-distended  MS: No edema; No deformity. Neuro:  Nonfocal  Psych: Normal affect   Labs    High Sensitivity Troponin:   Recent Labs  Lab 07/19/21 1054 07/19/21 1321  TROPONINIHS 7 8     Chemistry Recent Labs  Lab  07/13/21 1206 07/13/21 1218 07/16/21 0138 07/17/21 0435 07/19/21 0324  NA 137   < > 138 137 135  K 3.8   < > 4.1 3.6 3.8  CL 107   < > 107 103 101  CO2 21*   < > 26 25 25   GLUCOSE 188*   < > 143* 127* 262*  BUN 16   < > 10 9 12   CREATININE 1.54*   < > 1.05 1.05 1.43*  CALCIUM 8.8*   < > 8.6* 8.4* 8.4*  MG  --   --   --  1.7  --   PROT 6.8  --   --   --   --   ALBUMIN 3.5  --   --   --   --   AST 19  --   --   --   --   ALT 15  --   --   --   --   ALKPHOS 51  --   --   --   --   BILITOT 0.9  --   --   --   --   GFRNONAA 48*   < > >60 >60 52*  ANIONGAP 9   < >  5 9 9    < > = values in this interval not displayed.    Lipids No results for input(s): CHOL, TRIG, HDL, LABVLDL, LDLCALC, CHOLHDL in the last 168 hours.  Hematology Recent Labs  Lab 07/18/21 0747 07/19/21 0324 07/20/21 0952  WBC 6.6 8.2 13.4*  RBC 2.99* 3.00* 2.71*  HGB 8.3* 8.2* 7.5*  HCT 25.2* 25.4* 23.1*  MCV 84.3 84.7 85.2  MCH 27.8 27.3 27.7  MCHC 32.9 32.3 32.5  RDW 15.6* 15.6* 15.9*  PLT 146* 179 175   Thyroid No results for input(s): TSH, FREET4 in the last 168 hours.  BNPNo results for input(s): BNP, PROBNP in the last 168 hours.  DDimer No results for input(s): DDIMER in the last 168 hours.   Radiology    No results found.  Cardiac Studies   Echo pending  LHC 12/28/20: Mid LAD lesion is 100% stenosed. 1st Sept lesion is 90% stenosed. 2nd Mrg lesion is 100% stenosed. Mid RCA to Dist RCA lesion is 100% stenosed. Prox RCA lesion is 100% stenosed. RPDA lesion is 100% stenosed. SVG. Non-stenotic Origin lesion was previously treated. SVG. Mid Graft lesion is 60% stenosed. Non-stenotic Origin lesion was previously treated. LIMA and is normal in caliber. Mid Cx lesion is 100% stenosed. 3rd Mrg lesion is 100% stenosed. LV end diastolic pressure is normal.   Significant multivessel native CAD with total occlusion of the proximal LAD after a small first diagonal and septal perforating artery;  total occlusion of the proximal circumflex; and total occlusion of the mid RCA after the RV marginal branch.   Patent LIMA to LAD   Patent SVG to diagonal vessel with widely patent proximal stent.   Patent sequential vein graft supplying the OM1 and OM 2 vessel.   Patent SVG supplying the mid PDA vessel with widely patent previously placed proximal stent.  There is mid graft narrowing in the range of 60%.  The distal PDA vessel is now totally occluded at the site of previous 99% stenosis.  There are some to the left to right collaterals to the PDA.   RECOMMENDATION: The patient has been on long-term aspirin and prasugrel; continue DAPT.  Plan increase medical therapy.  Continue aggressive lipid-lowering therapy and optimal blood pressure control.    Patient Profile     Antonio [name] is a 59M with CAD s/p CABG, hypertension, hyperlipidemia, diabetes, and prostate cancer admitted with hematuria.  Cardiology consulted for chest pain.   Assessment & Plan    # CAD s/p CABG:  # Chest pain:  # Hyperlipidemia:  Chest pain occurred at rest in the setting of anemia.  Hemoglobin was less than 8 at the time.  He is currently chest pain-free.  High-sensitivity troponin has remained negative.  Recommend continuing to keep his hemoglobin greater than 8.  We have CBC for this morning.  Of course, need to continue holding aspirin and prasugrel in the setting of his ongoing hematuria.  We did resume amlodipine yesterday.  His blood pressure remains poorly controlled so we will increase it back to 5 mg, which is his baseline.  Continue metoprolol and Imdur.  Echocardiogram is pending.  No plan for repeat ischemic evaluation at this time.  Continue rosuvastatin.  Given his diabetes he should be on an GLP1 agonist.  # Hypertension:  Blood pressure poorly controlled as above.  Increasing amlodipine to 5 mg.  Continue metoprolol and Imdur.  Given his diabetes he should be on an ARB.  Will not start it now  given his  AKI.      For questions or updates, please contact Antonio [name] Please consult [email or web address] for contact info under        Signed, Skeet Latch, MD  07/20/2021, 10:22 AM

## 2021-07-20 NOTE — Progress Notes (Addendum)
In the ED PROGRESS NOTE    Antonio [name]  ZSW:[id number] DOB: September 08, 1946 DOA: 07/13/2021 PCP: [name], MD   Brief Narrative:  This 75 years old male with PMH significant for prostate cancer treated 20 years ago, CAD s/p CABG , history of GI bleed, hypertension, type 2 diabetes presented in the ED  with gross hematuria.  Patient reports having trouble voiding and developed hematuria 4 days ago,  his primary care physician has referred him to urologist who placed a Foley catheter.  Patient reports now catheter stopped draining and urine is dribbling out around the tubing.  Patient seems uncomfortable but denies any fever, chills or malaise. CT abdomen showed likely clots in the bladder. Urology is consulted,  started on IV ceftriaxone for UTI.  Urology recommended to continue CBI for now. Patient continues to have hematuria despite being on CBI. Patient underwent fulguration with improvement of hematuria. Patient was discharged. He has developed chest pain before discharge,  improved with nitroglycerin.  Cardiology consulted, recommended echo to check wall motion abnormalities.  Assessment & Plan:   Principal Problem:   Hematuria Active Problems:   Type II diabetes mellitus (HCC)   HTN (hypertension)   BPH (benign prostatic hyperplasia)   CAD (coronary artery disease)   Thrombocytopenia (HCC)  Gross hematuria: Patient presented with gross hematuria and passing clots in the catheter. Urology was consulted,  recommended to continue CBI. Continue antibiotics for UTI. Hold Effient and aspirin for now. Monitor H&H.  Hemoglobin down from 11.2->9.8 > 9.6>9.2>8.7 Patient continues to have hematuria despite being on CBI. Patient underwent fulguration with improvement of hematuria. Urologist recommended discharge with catheter and follow-up in 1 week.  Sepsis secondary to UTI: Patient presented with tachypnea, leukocytosis, Hypotension, lactic acid 2.7, UTI. CT scan shows blood clots  in the catheter, no stones or hydronephrosis Patient has received IV fluids as per sepsis protocol. Completed ceftriaxone on 10/7 Blood cultures no growth so far, urine cultures contaminated. Sepsis physiology has resolved.   Chest pain /  CAD history of CABG: Aspirin and Effient is kept on hold due to hematuria. Continue metoprolol , Crestor, Imdur. Patient has developed chest pain after discharge. Troponin negative, EKG shows some changes Cardiology consulted, think this is demand ischemia rather than plaque rupture. Obtain an echocardiogram to see if there is new wall motion abnormalities. Plan is to manage conservatively,  Continue Imdur, metoprolol and amlodipine. Resume antiplatelets if hematuria resolves.  Type 2 diabetes : Continue sliding scale.  Thrombocytopenia: Platelet count stable.   DVT prophylaxis: SCDs Code Status: Full code Family Communication: No family at bedside Disposition Plan:    Status is: Inpatient  Remains inpatient appropriate because:Inpatient level of care appropriate due to severity of illness  Dispo: The patient is from: Home              Anticipated d/c is to: Home              Patient currently is not medically stable to d/c.   Difficult to place patient No  Consultants:  Urology  Procedures: CBI   antimicrobials:  Anti-infectives (From admission, onward)    Start     Dose/Rate Route Frequency Ordered Stop   07/13/21 2100  ceFEPIme (MAXIPIME) 2 g in sodium chloride 0.9 % 100 mL IVPB  Status:  Discontinued        2 g 200 mL/hr over 30 Minutes Intravenous Every 12 hours 07/13/21 1249 07/13/21 1550   07/13/21 2100  cefTRIAXone (ROCEPHIN) 1  g in sodium chloride 0.9 % 100 mL IVPB        1 g 200 mL/hr over 30 Minutes Intravenous Every 24 hours 07/13/21 1550 07/20/21 2359   07/13/21 1230  ceFEPIme (MAXIPIME) 2 g in sodium chloride 0.9 % 100 mL IVPB        2 g 200 mL/hr over 30 Minutes Intravenous  Once 07/13/21 1229 07/13/21 1349        Subjective: Patient was seen and examined at bedside.  Overnight events noted.   Patient underwent fulguration with improvement of hematuria on 10/5. Patient was discharged but then has developed chest pain.   Patient reports chest pain has resolved.  Awaiting echocardiogram.  Objective: Vitals:   07/19/21 1620 07/19/21 2112 07/20/21 0712 07/20/21 0948  BP: 122/67 126/78 (!) 162/88 131/76  Pulse: 75 72 73 70  Resp:  18 18 16   Temp: 98.9 F (37.2 C) 98.7 F (37.1 C) 98 F (36.7 C) 98.7 F (37.1 C)  TempSrc: Oral Oral Oral Oral  SpO2: 98% 95% 95% 95%  Weight:  81 kg    Height:        Intake/Output Summary (Last 24 hours) at 07/20/2021 1025 Last data filed at 07/20/2021 0900 Gross per 24 hour  Intake 3053.03 ml  Output 2775 ml  Net 278.03 ml   Filed Weights   07/13/21 1203 07/19/21 2112  Weight: 80.7 kg 81 kg    Examination:  General exam: Appears comfortable, not in any acute distress, deconditioned. Respiratory system: Clear to auscultation bilaterally, RR 12 Cardiovascular system: S1-S2 heard, regular rate and rhythm, no murmur. Gastrointestinal system: Abdomen is soft, nontender, nondistended, BS+ Central nervous system: Alert and oriented x 3. No focal neurological deficits. Genitourinary: Urine is getting clear.,  Denies urinary discomfort. Extremities: No edema, no cyanosis, no clubbing. Skin: No rashes, lesions or ulcers Psychiatry: Judgement and insight appear normal. Mood & affect appropriate.     Data Reviewed: I have personally reviewed following labs and imaging studies  CBC: Recent Labs  Lab 07/13/21 1206 07/13/21 1218 07/17/21 0435 07/18/21 0256 07/18/21 0747 07/19/21 0324 07/20/21 0952  WBC 11.6*   < > 6.2 5.9 6.6 8.2 13.4*  NEUTROABS 9.7*  --   --   --   --   --   --   HGB 11.9*   < > 9.2* 7.6* 8.3* 8.2* 7.5*  HCT 37.2*   < > 28.4* 23.3* 25.2* 25.4* 23.1*  MCV 85.9   < > 84.5 83.8 84.3 84.7 85.2  PLT 139*   < > 146* 139* 146* 179 175    < > = values in this interval not displayed.   Basic Metabolic Panel: Recent Labs  Lab 07/14/21 0350 07/15/21 0232 07/16/21 0138 07/17/21 0435 07/19/21 0324  NA 137 136 138 137 135  K 3.4* 4.1 4.1 3.6 3.8  CL 106 105 107 103 101  CO2 23 22 26 25 25   GLUCOSE 94 174* 143* 127* 262*  BUN 12 12 10 9 12   CREATININE 1.12 1.10 1.05 1.05 1.43*  CALCIUM 8.5* 8.3* 8.6* 8.4* 8.4*  MG  --   --   --  1.7  --   PHOS  --   --   --  3.2  --    GFR: Estimated Creatinine Clearance: 48.1 mL/min (A) (by C-G formula based on SCr of 1.43 mg/dL (H)). Liver Function Tests: Recent Labs  Lab 07/13/21 1206  AST 19  ALT 15  ALKPHOS 51  BILITOT  0.9  PROT 6.8  ALBUMIN 3.5   No results for input(s): LIPASE, AMYLASE in the last 168 hours. No results for input(s): AMMONIA in the last 168 hours. Coagulation Profile: No results for input(s): INR, PROTIME in the last 168 hours. Cardiac Enzymes: No results for input(s): CKTOTAL, CKMB, CKMBINDEX, TROPONINI in the last 168 hours. BNP (last 3 results) No results for input(s): PROBNP in the last 8760 hours. HbA1C: No results for input(s): HGBA1C in the last 72 hours. CBG: Recent Labs  Lab 07/19/21 0647 07/19/21 1146 07/19/21 1620 07/19/21 2113 07/20/21 0729  GLUCAP 239* 284* 237* 174* 156*   Lipid Profile: No results for input(s): CHOL, HDL, LDLCALC, TRIG, CHOLHDL, LDLDIRECT in the last 72 hours. Thyroid Function Tests: No results for input(s): TSH, T4TOTAL, FREET4, T3FREE, THYROIDAB in the last 72 hours. Anemia Panel: No results for input(s): VITAMINB12, FOLATE, FERRITIN, TIBC, IRON, RETICCTPCT in the last 72 hours. Sepsis Labs: Recent Labs  Lab 07/13/21 1300 07/13/21 1430  LATICACIDVEN 2.8* 2.4*    Recent Results (from the past 240 hour(s))  Culture, blood (routine x 2)     Status: None   Collection Time: 07/13/21  1:00 PM   Specimen: BLOOD RIGHT ARM  Result Value Ref Range Status   Specimen Description BLOOD RIGHT ARM  Final    Special Requests   Final    BOTTLES DRAWN AEROBIC AND ANAEROBIC Blood Culture results may not be optimal due to an inadequate volume of blood received in culture bottles   Culture   Final    NO GROWTH 5 DAYS Performed at [facility], [place] [address] [place], [place] [address]    Report Status 07/18/2021 FINAL  Final  Urine Culture     Status: Abnormal   Collection Time: 07/13/21  1:00 PM   Specimen: Urine, Catheterized  Result Value Ref Range Status   Specimen Description URINE, CATHETERIZED  Final   Special Requests   Final    NONE Performed at [facility], [place] [address], [place], [place] [address]    Culture MULTIPLE SPECIES PRESENT, SUGGEST RECOLLECTION (A)  Final   Report Status 07/14/2021 FINAL  Final  Culture, blood (routine x 2)     Status: None   Collection Time: 07/13/21  1:08 PM   Specimen: BLOOD LEFT ARM  Result Value Ref Range Status   Specimen Description BLOOD LEFT ARM  Final   Special Requests   Final    BOTTLES DRAWN AEROBIC AND ANAEROBIC Blood Culture adequate volume   Culture   Final    NO GROWTH 5 DAYS Performed at [facility], [place] [address], [place], [place] [address]    Report Status 07/18/2021 FINAL  Final  SARS CORONAVIRUS 2 (TAT 6-24 HRS) Nasopharyngeal Nasopharyngeal Swab     Status: None   Collection Time: 07/13/21 11:09 PM   Specimen: Nasopharyngeal Swab  Result Value Ref Range Status   SARS Coronavirus 2 NEGATIVE NEGATIVE Final    Comment: (NOTE) SARS-CoV-2 target nucleic acids are NOT DETECTED.  The SARS-CoV-2 RNA is generally detectable in upper and lower respiratory specimens during the acute phase of infection. Negative results do not preclude SARS-CoV-2 infection, do not rule out co-infections with other pathogens, and should not be used as the sole basis for treatment or other patient management decisions. Negative results must be combined with clinical observations, patient history, and epidemiological information.  The expected result is Negative.  Fact Sheet for Patients: SugarRoll.be  Fact Sheet for  Healthcare Providers: https://www.woods-mathews.com/  This test is not yet approved or cleared by the [place] and  has been authorized for detection and/or diagnosis of SARS-CoV-2 by FDA under an Emergency Use Authorization (EUA). This EUA will remain  in effect (meaning this test can be used) for the duration of the COVID-19 declaration under Se ction 564(b)(1) of the Act, 21 U.S.C. section 360bbb-3(b)(1), unless the authorization is terminated or revoked sooner.  Performed at [facility], [place] [address] [place], [place] [address]   Surgical pcr screen     Status: None   Collection Time: 07/17/21 10:19 PM   Specimen: Nasal Mucosa; Nasal Swab  Result Value Ref Range Status   MRSA, PCR NEGATIVE NEGATIVE Final   Staphylococcus aureus NEGATIVE NEGATIVE Final    Comment: (NOTE) The Xpert SA Assay (FDA approved for NASAL specimens in patients 39 years of age and older), is one component of a comprehensive surveillance program. It is not intended to diagnose infection nor to guide or monitor treatment. Performed at [facility], [place] [address], [place], [place] [address]     Radiology Studies: No results found.  Scheduled Meds:  amLODipine  5 mg Oral Daily   Chlorhexidine Gluconate Cloth  6 each Topical Daily   finasteride  5 mg Oral Daily   insulin aspart  0-15 Units Subcutaneous TID WC   isosorbide mononitrate  90 mg Oral q morning   metoprolol succinate  50 mg Oral Daily   rosuvastatin  40 mg Oral Daily   Continuous Infusions:  cefTRIAXone (ROCEPHIN)  IV Stopped (07/19/21 2225)   lactated ringers Stopped (07/20/21 0838)   sodium chloride irrigation       LOS: 7 days    Time spent: 25 mins    Nechuma Boven, MD [facility]   If 7PM-7AM, please contact night-coverage

## 2021-07-21 DIAGNOSIS — R310 Gross hematuria: Secondary | ICD-10-CM | POA: Diagnosis not present

## 2021-07-21 LAB — BASIC METABOLIC PANEL
Anion gap: 9 (ref 5–15)
BUN: 10 mg/dL (ref 8–23)
CO2: 26 mmol/L (ref 22–32)
Calcium: 8.4 mg/dL — ABNORMAL LOW (ref 8.9–10.3)
Chloride: 103 mmol/L (ref 98–111)
Creatinine, Ser: 1.24 mg/dL (ref 0.61–1.24)
GFR, Estimated: >60 mL/min (ref >60)
Glucose, Bld: 191 mg/dL — ABNORMAL HIGH (ref 70–99)
Potassium: 3.2 mmol/L — ABNORMAL LOW (ref 3.5–5.1)
Sodium: 138 mmol/L (ref 135–145)

## 2021-07-21 LAB — GLUCOSE, CAPILLARY
Glucose-Capillary: 140 mg/dL — ABNORMAL HIGH (ref 70–99)
Glucose-Capillary: 290 mg/dL — ABNORMAL HIGH (ref 70–99)
Glucose-Capillary: 245 mg/dL — ABNORMAL HIGH (ref 70–99)
Glucose-Capillary: 197 mg/dL — ABNORMAL HIGH (ref 70–99)
Glucose-Capillary: 200 mg/dL — ABNORMAL HIGH (ref 70–99)

## 2021-07-21 LAB — HEMOGLOBIN AND HEMATOCRIT, BLOOD
HCT: 24.8 % — ABNORMAL LOW (ref 39.0–52.0)
Hemoglobin: 7.8 g/dL — ABNORMAL LOW (ref 13.0–17.0)

## 2021-07-21 LAB — PREPARE RBC (CROSSMATCH)

## 2021-07-21 LAB — MAGNESIUM: Magnesium: 1.7 mg/dL (ref 1.7–2.4)

## 2021-07-21 MED ORDER — POTASSIUM CHLORIDE CRYS ER 20 MEQ PO TBCR
40.0000 meq | EXTENDED_RELEASE_TABLET | ORAL | Status: AC
  Administered 2021-07-21 (×2): 40 meq via ORAL
  Filled 2021-07-21 (×2): qty 2

## 2021-07-21 MED ORDER — SODIUM CHLORIDE 0.9% IV SOLUTION: Freq: Once | INTRAVENOUS | Status: AC

## 2021-07-21 NOTE — Plan of Care (Signed)

## 2021-07-21 NOTE — Progress Notes (Signed)
PROGRESS NOTE    Antonio [name]  AYT:[id number] DOB: 28-Dec-1945 DOA: 07/13/2021 PCP: [name], MD   No chief complaint on file.  Brief Narrative:  This 75 years old male with PMH significant for prostate cancer treated 20 years ago, CAD s/p CABG , history of GI bleed, hypertension, type 2 diabetes presented in the ED  with gross hematuria.  He had foley catheter placed with urology, but developed issues with catheter draining and obstruction in the setting of hematuria.   He was treated with CBI, but due to persistent bleeding, he had cystoscopy with clot evacuation and fulguration of the prostate and bladder neck.  He was approaching readiness for discharge, but developed CP.  Cardiology was consulted and recommended an echo and to keep Hb above 8.  DAPT will need to be held while he's bleeding.    Assessment & Plan:   Principal Problem:   Hematuria Active Problems:   Type II diabetes mellitus (HCC)   HTN (hypertension)   BPH (benign prostatic hyperplasia)   CAD (coronary artery disease)   Thrombocytopenia (HCC)  Gross Hematuria  radiation induced bleeding Failed CBI Now s/p fulguration with improvement Aspirin and effient on hold (urology recommends holding "anticoagulation" until foley is removed Hb relatively stable today, still some blood tinged output  Plan for voiding trial next Wednesday with urology Consider hyperbaric o2 therapy if persistent  Chest pain /  CAD history of CABG: Aspirin and Effient are kept on hold due to hematuria. Continue metoprolol , Crestor, Imdur. CP resolved at this point Echo without WMA - normal EF, grade II diastolic dysfunction Cardiology recommending keep hb >8 (will transfuse 1 unit pRBC today) - no additional cardiac intervention planned No additional cardiac w/u planned Troponin negative  Sepsis secondary to UTI: Patient presented with tachypnea, leukocytosis, Hypotension, lactic acid 2.7, UTI. CT scan shows blood clots in  the catheter, no stones or hydronephrosis Patient has received IV fluids as per sepsis protocol. Completed ceftriaxone on 10/7 Blood cultures no growth so far, urine cultures contaminated. Sepsis physiology has resolved.    Type 2 diabetes : Continue sliding scale.   Thrombocytopenia: Platelet count stable.  HTN Continue amlodipine, metop, imdur  DVT prophylaxis: SCD Code Status: full  Family Communication: none at bedside Disposition:   Status is: Inpatient  Remains inpatient appropriate because:Inpatient level of care appropriate due to severity of illness  Dispo: The patient is from: Home              Anticipated d/c is to: Home              Patient currently is not medically stable to d/c.   Difficult to place patient No       Consultants:  Urology cardiology  Procedures Echo IMPRESSIONS     1. Left ventricular ejection fraction, by estimation, is 55 to 60%. The  left ventricle has normal function. The left ventricle has no regional  wall motion abnormalities. Left ventricular diastolic parameters are  consistent with Grade II diastolic  dysfunction (pseudonormalization).   2. Right ventricular systolic function is normal. The right ventricular  size is normal.   3. Left atrial size was mildly dilated.   4. Right atrial size was mildly dilated.   5. The mitral valve is normal in structure. Moderate mitral valve  regurgitation. No evidence of mitral stenosis.   6. Tricuspid valve regurgitation is moderate.   7. The aortic valve is normal in structure. Aortic valve regurgitation is  not visualized. No aortic stenosis is present.   8. The inferior vena cava is normal in size with greater than 50%  respiratory variability, suggesting right atrial pressure of 3 mmHg.   Comparison(s): No significant change from prior study. Prior images  reviewed side by side.   Procedure: Cystoscopy, blood clot evacuation Fulguration of prostate and bladder neck     Antimicrobials: Anti-infectives (From admission, onward)    Start     Dose/Rate Route Frequency Ordered Stop   07/13/21 2100  ceFEPIme (MAXIPIME) 2 g in sodium chloride 0.9 % 100 mL IVPB  Status:  Discontinued        2 g 200 mL/hr over 30 Minutes Intravenous Every 12 hours 07/13/21 1249 07/13/21 1550   07/13/21 2100  cefTRIAXone (ROCEPHIN) 1 g in sodium chloride 0.9 % 100 mL IVPB        1 g 200 mL/hr over 30 Minutes Intravenous Every 24 hours 07/13/21 1550 07/20/21 2209   07/13/21 1230  ceFEPIme (MAXIPIME) 2 g in sodium chloride 0.9 % 100 mL IVPB        2 g 200 mL/hr over 30 Minutes Intravenous  Once 07/13/21 1229 07/13/21 1349          Subjective: No complaints  Objective: Vitals:   07/20/21 2120 07/21/21 0451 07/21/21 0846 07/21/21 1600  BP: 124/77 127/71 134/76 125/66  Pulse: 64 65 66 64  Resp: 18 16 18 18   Temp: 98.7 F (37.1 C) 99.2 F (37.3 C) 98 F (36.7 C) 99.3 F (37.4 C)  TempSrc: Oral Oral Oral Oral  SpO2: 96% 94% 95% 93%  Weight:      Height:        Intake/Output Summary (Last 24 hours) at 07/21/2021 1726 Last data filed at 07/21/2021 1156 Gross per 24 hour  Intake 2903.94 ml  Output 1650 ml  Net 1253.94 ml   Filed Weights   07/13/21 1203 07/19/21 2112  Weight: 80.7 kg 81 kg    Examination:  General: No acute distress. Cardiovascular: RRR Lungs: unlabored Abdomen: Soft, nontender, nondistended Foley with blood tinged output Neurological: Alert and oriented 3. Moves all extremities 4 . Cranial nerves II through XII grossly intact. Skin: Warm and dry. No rashes or lesions. Extremities: No clubbing or cyanosis. No edema.   Data Reviewed: I have personally reviewed following labs and imaging studies  CBC: Recent Labs  Lab 07/17/21 0435 07/18/21 0256 07/18/21 0747 07/19/21 0324 07/20/21 0952 07/21/21 1441  WBC 6.2 5.9 6.6 8.2 13.4*  --   HGB 9.2* 7.6* 8.3* 8.2* 7.5* 7.8*  HCT 28.4* 23.3* 25.2* 25.4* 23.1* 24.8*  MCV 84.5 83.8 84.3  84.7 85.2  --   PLT 146* 139* 146* 179 175  --     Basic Metabolic Panel: Recent Labs  Lab 07/15/21 0232 07/16/21 0138 07/17/21 0435 07/19/21 0324 07/21/21 1441  NA 136 138 137 135 138  K 4.1 4.1 3.6 3.8 3.2*  CL 105 107 103 101 103  CO2 22 26 25 25 26   GLUCOSE 174* 143* 127* 262* 191*  BUN 12 10 9 12 10   CREATININE 1.10 1.05 1.05 1.43* 1.24  CALCIUM 8.3* 8.6* 8.4* 8.4* 8.4*  MG  --   --  1.7  --   --   PHOS  --   --  3.2  --   --     GFR: Estimated Creatinine Clearance: 55.4 mL/min (by C-G formula based on SCr of 1.24 mg/dL).  Liver Function Tests: No results for  input(s): AST, ALT, ALKPHOS, BILITOT, PROT, ALBUMIN in the last 168 hours.  CBG: Recent Labs  Lab 07/20/21 1748 07/20/21 2124 07/21/21 0636 07/21/21 1130 07/21/21 1616  GLUCAP 162* 168* 140* 290* 245*     Recent Results (from the past 240 hour(s))  Culture, blood (routine x 2)     Status: None   Collection Time: 07/13/21  1:00 PM   Specimen: BLOOD RIGHT ARM  Result Value Ref Range Status   Specimen Description BLOOD RIGHT ARM  Final   Special Requests   Final    BOTTLES DRAWN AEROBIC AND ANAEROBIC Blood Culture results may not be optimal due to an inadequate volume of blood received in culture bottles   Culture   Final    NO GROWTH 5 DAYS Performed at [facility], [place] [address] [place], [place] [address]    Report Status 07/18/2021 FINAL  Final  Urine Culture     Status: Abnormal   Collection Time: 07/13/21  1:00 PM   Specimen: Urine, Catheterized  Result Value Ref Range Status   Specimen Description URINE, CATHETERIZED  Final   Special Requests   Final    NONE Performed at [facility], [place] [address], [place], [place] [address]    Culture MULTIPLE SPECIES PRESENT, SUGGEST RECOLLECTION (Sakira Dahmer)  Final   Report Status 07/14/2021 FINAL  Final  Culture, blood (routine x 2)     Status: None   Collection Time: 07/13/21  1:08 PM   Specimen: BLOOD LEFT ARM  Result Value Ref Range  Status   Specimen Description BLOOD LEFT ARM  Final   Special Requests   Final    BOTTLES DRAWN AEROBIC AND ANAEROBIC Blood Culture adequate volume   Culture   Final    NO GROWTH 5 DAYS Performed at [facility], [place] [address], [place], [place] [address]    Report Status 07/18/2021 FINAL  Final  SARS CORONAVIRUS 2 (TAT 6-24 HRS) Nasopharyngeal Nasopharyngeal Swab     Status: None   Collection Time: 07/13/21 11:09 PM   Specimen: Nasopharyngeal Swab  Result Value Ref Range Status   SARS Coronavirus 2 NEGATIVE NEGATIVE Final    Comment: (NOTE) SARS-CoV-2 target nucleic acids are NOT DETECTED.  The SARS-CoV-2 RNA is generally detectable in upper and lower respiratory specimens during the acute phase of infection. Negative results do not preclude SARS-CoV-2 infection, do not rule out co-infections with other pathogens, and should not be used as the sole basis for treatment or other patient management decisions. Negative results must be combined with clinical observations, patient history, and epidemiological information. The expected result is Negative.  Fact Sheet for Patients: SugarRoll.be  Fact Sheet for Healthcare Providers: https://www.woods-mathews.com/  This test is not yet approved or cleared by the [place] FDA and  has been authorized for detection and/or diagnosis of SARS-CoV-2 by FDA under an Emergency Use Authorization (EUA). This EUA will remain  in effect (meaning this test can be used) for the duration of the COVID-19 declaration under Se ction 564(b)(1) of the Act, 21 U.S.C. section 360bbb-3(b)(1), unless the authorization is terminated or revoked sooner.  Performed at [facility], [place] [address], [place], [place] [address]   Surgical pcr screen     Status: None   Collection Time: 07/17/21 10:19 PM   Specimen: Nasal Mucosa; Nasal Swab  Result Value Ref Range Status   MRSA, PCR NEGATIVE NEGATIVE Final    Staphylococcus aureus NEGATIVE NEGATIVE Final  Comment: (NOTE) The Xpert SA Assay (FDA approved for NASAL specimens in patients 17 years of age and older), is one component of [facility] comprehensive surveillance program. It is not intended to diagnose infection nor to guide or monitor treatment. Performed at [facility], [place] [address], [place], [place] [address]          Radiology Studies: ECHOCARDIOGRAM COMPLETE  Result Date: 07/20/2021    ECHOCARDIOGRAM REPORT   Patient Name:   Antonio [name] Date of Exam: 07/20/2021 Medical Rec #:  [id number]        Height:       67.5 in Accession #:    [id number]       Weight:       178.6 lb Date of Birth:  [birth year]       BSA:          1.938 m Patient Age:    29 years         BP:           131/76 mmHg Patient Gender: M                HR:           65 bpm. Exam Location:  Inpatient Procedure: 2D Echo, Color Doppler and Cardiac Doppler Indications:    Chest Pain  History:        Patient has prior history of Echocardiogram examinations, most                 recent 07/27/2020. CAD; Risk Factors:Diabetes, Hypertension and                 Dyslipidemia.  Sonographer:    Melissa Morford RDCS (AE, PE) Referring Phys: [id number] [place]  1. Left ventricular ejection fraction, by estimation, is 55 to 60%. The left ventricle has normal function. The left ventricle has no regional wall motion abnormalities. Left ventricular diastolic parameters are consistent with Grade II diastolic dysfunction (pseudonormalization).  2. Right ventricular systolic function is normal. The right ventricular size is normal.  3. Left atrial size was mildly dilated.  4. Right atrial size was mildly dilated.  5. The mitral valve is normal in structure. Moderate mitral valve regurgitation. No evidence of mitral stenosis.  6. Tricuspid valve regurgitation is moderate.  7. The aortic valve is normal in structure. Aortic valve regurgitation is not visualized. No aortic  stenosis is present.  8. The inferior vena cava is normal in size with greater than 50% respiratory variability, suggesting right atrial pressure of 3 mmHg. Comparison(s): No significant change from prior study. Prior images reviewed side by side. FINDINGS  Left Ventricle: Left ventricular ejection fraction, by estimation, is 55 to 60%. The left ventricle has normal function. The left ventricle has no regional wall motion abnormalities. The left ventricular internal cavity size was normal in size. There is  no left ventricular hypertrophy. Left ventricular diastolic parameters are consistent with Grade II diastolic dysfunction (pseudonormalization). Right Ventricle: The right ventricular size is normal. No increase in right ventricular wall thickness. Right ventricular systolic function is normal. Left Atrium: Left atrial size was mildly dilated. Right Atrium: Right atrial size was mildly dilated. Pericardium: There is no evidence of pericardial effusion. Mitral Valve: The mitral valve is normal in structure. Moderate mitral valve regurgitation, with posteriorly-directed jet. No evidence of mitral valve stenosis. Tricuspid Valve: The tricuspid valve is normal in structure. Tricuspid valve regurgitation is moderate . No evidence of tricuspid stenosis.  Aortic Valve: The aortic valve is normal in structure. Aortic valve regurgitation is not visualized. No aortic stenosis is present. Pulmonic Valve: The pulmonic valve was normal in structure. Pulmonic valve regurgitation is not visualized. No evidence of pulmonic stenosis. Aorta: The aortic root is normal in size and structure. Venous: The inferior vena cava is normal in size with greater than 50% respiratory variability, suggesting right atrial pressure of 3 mmHg. IAS/Shunts: No atrial level shunt detected by color flow Doppler.  LEFT VENTRICLE PLAX 2D LVIDd:         4.70 cm   Diastology LVIDs:         3.30 cm   LV e' medial:    5.66 cm/s LV PW:         1.10 cm   LV  E/e' medial:  16.6 LV IVS:        1.00 cm   LV e' lateral:   9.25 cm/s LVOT diam:     2.00 cm   LV E/e' lateral: 10.1 LV SV:         51 LV SV Index:   26 LVOT Area:     3.14 cm  RIGHT VENTRICLE RV S prime:     9.90 cm/s TAPSE (M-mode): 1.8 cm LEFT ATRIUM             Index        RIGHT ATRIUM           Index LA diam:        3.90 cm 2.01 cm/m   RA Area:     19.40 cm LA Vol (A2C):   36.5 ml 18.84 ml/m  RA Volume:   58.00 ml  29.93 ml/m LA Vol (A4C):   32.3 ml 16.67 ml/m LA Biplane Vol: 35.9 ml 18.53 ml/m  AORTIC VALVE LVOT Vmax:   73.80 cm/s LVOT Vmean:  48.200 cm/s LVOT VTI:    0.161 m  AORTA Ao Root diam: 3.30 cm Ao Asc diam:  3.30 cm MITRAL VALVE MV Area (PHT): 4.93 cm    SHUNTS MV Decel Time: 154 msec    Systemic VTI:  0.16 m MV E velocity: 93.80 cm/s  Systemic Diam: 2.00 cm MV Advait Buice velocity: 75.40 cm/s MV E/Mariana Wiederholt ratio:  1.24 Candee Furbish MD Electronically signed by Candee Furbish MD Signature Date/Time: 07/20/2021/12:38:31 PM    Final         Scheduled Meds:  sodium chloride   Intravenous Once   amLODipine  5 mg Oral Daily   Chlorhexidine Gluconate Cloth  6 each Topical Daily   finasteride  5 mg Oral Daily   insulin aspart  0-15 Units Subcutaneous TID WC   isosorbide mononitrate  90 mg Oral q morning   metoprolol succinate  50 mg Oral Daily   rosuvastatin  40 mg Oral Daily   Continuous Infusions:  lactated ringers 125 mL/hr at 07/21/21 1648   sodium chloride irrigation       LOS: 8 days    Time spent: over 30 min    Fayrene Helper, MD [facility]   To contact the attending provider between 7A-7P or the covering provider during after hours 7P-7A, please log into the web site [email or web address] and access using universal [place] password for that web site. If you do not have the password, please call the hospital operator.  07/21/2021, 5:26 PM

## 2021-07-21 NOTE — Progress Notes (Signed)
Echo reviewed and low risk  Dr Blenda Mounts note from yesterday also reviewed.  No plans for further CV intervention.  Cardiology to sign off.  Please call with questions.  Thompson Grayer MD, [facility] 07/21/2021 8:16 AM

## 2021-07-22 DIAGNOSIS — R310 Gross hematuria: Secondary | ICD-10-CM | POA: Diagnosis not present

## 2021-07-22 LAB — CBC WITH DIFFERENTIAL/PLATELET
Abs Immature Granulocytes: 0.05 K/uL (ref 0.00–0.07)
Basophils Absolute: 0 K/uL (ref 0.0–0.1)
Basophils Relative: 0 %
Eosinophils Absolute: 0.3 K/uL (ref 0.0–0.5)
Eosinophils Relative: 3 %
HCT: 28.5 % — ABNORMAL LOW (ref 39.0–52.0)
Hemoglobin: 9 g/dL — ABNORMAL LOW (ref 13.0–17.0)
Immature Granulocytes: 1 %
Lymphocytes Relative: 26 %
Lymphs Abs: 2.3 K/uL (ref 0.7–4.0)
MCH: 27.3 pg (ref 26.0–34.0)
MCHC: 31.6 g/dL (ref 30.0–36.0)
MCV: 86.4 fL (ref 80.0–100.0)
Monocytes Absolute: 0.7 K/uL (ref 0.1–1.0)
Monocytes Relative: 8 %
Neutro Abs: 5.3 K/uL (ref 1.7–7.7)
Neutrophils Relative %: 62 %
Platelets: 194 K/uL (ref 150–400)
RBC: 3.3 MIL/uL — ABNORMAL LOW (ref 4.22–5.81)
RDW: 15.1 % (ref 11.5–15.5)
WBC: 8.6 K/uL (ref 4.0–10.5)
nRBC: 0.5 % — ABNORMAL HIGH (ref 0.0–0.2)

## 2021-07-22 LAB — COMPREHENSIVE METABOLIC PANEL
ALT: 66 U/L — ABNORMAL HIGH (ref 0–44)
AST: 42 U/L — ABNORMAL HIGH (ref 15–41)
Albumin: 2.9 g/dL — ABNORMAL LOW (ref 3.5–5.0)
Alkaline Phosphatase: 51 U/L (ref 38–126)
Anion gap: 8 (ref 5–15)
BUN: 8 mg/dL (ref 8–23)
CO2: 23 mmol/L (ref 22–32)
Calcium: 8.4 mg/dL — ABNORMAL LOW (ref 8.9–10.3)
Chloride: 105 mmol/L (ref 98–111)
Creatinine, Ser: 1.11 mg/dL (ref 0.61–1.24)
GFR, Estimated: >60 mL/min (ref >60)
Glucose, Bld: 159 mg/dL — ABNORMAL HIGH (ref 70–99)
Potassium: 3.8 mmol/L (ref 3.5–5.1)
Sodium: 136 mmol/L (ref 135–145)
Total Bilirubin: 0.7 mg/dL (ref 0.3–1.2)
Total Protein: 6.1 g/dL — ABNORMAL LOW (ref 6.5–8.1)

## 2021-07-22 LAB — MAGNESIUM: Magnesium: 1.6 mg/dL — ABNORMAL LOW (ref 1.7–2.4)

## 2021-07-22 LAB — TYPE AND SCREEN
ABO/RH(D): AB POS
Antibody Screen: NEGATIVE
Unit division: 0

## 2021-07-22 LAB — PHOSPHORUS: Phosphorus: 3.2 mg/dL (ref 2.5–4.6)

## 2021-07-22 LAB — GLUCOSE, CAPILLARY
Glucose-Capillary: 133 mg/dL — ABNORMAL HIGH (ref 70–99)
Glucose-Capillary: 150 mg/dL — ABNORMAL HIGH (ref 70–99)

## 2021-07-22 LAB — BPAM RBC
Blood Product Expiration Date: 202210132359
ISSUE DATE / TIME: 202210081855
Unit Type and Rh: 600

## 2021-07-22 MED ORDER — FERROUS SULFATE 325 (65 FE) MG PO TABS: 325.0000 mg | ORAL_TABLET | Freq: Every day | ORAL | 3 refills | Status: DC

## 2021-07-22 MED ORDER — MAGNESIUM SULFATE 2 GM/50ML IV SOLN
2.0000 g | Freq: Once | INTRAVENOUS | Status: AC
  Administered 2021-07-22: 2 g via INTRAVENOUS
  Filled 2021-07-22: qty 50

## 2021-07-22 NOTE — Plan of Care (Signed)
  Problem: Education: Goal: Knowledge of General Education information will improve Description: Including pain rating scale, medication(s)/side effects and non-pharmacologic comfort measures Outcome: Progressing   Problem: Health Behavior/Discharge Planning: Goal: Ability to manage health-related needs will improve Outcome: Progressing   Problem: Clinical Measurements: Goal: Will remain free from infection Outcome: Progressing   

## 2021-07-22 NOTE — Plan of Care (Signed)

## 2021-07-22 NOTE — Progress Notes (Signed)
DISCHARGE NOTE HOME LANIER MILLON to be discharged Home per MD order. Discussed prescriptions and follow up appointments with the patient. Prescriptions given to patient; medication list explained in detail. Patient verbalized understanding.  Skin clean, dry and intact without evidence of skin break down, no evidence of skin tears noted. IV catheter discontinued intact. Site without signs and symptoms of complications. Dressing and pressure applied. Pt denies pain at the site currently. No complaints noted.  Patient free of lines, drains, and wounds.   An After Visit Summary (AVS) was printed and given to the patient. Patient escorted via wheelchair, and discharged home via private auto.  Dolores Hoose, RN

## 2021-07-22 NOTE — Plan of Care (Signed)
  Problem: Education: Goal: Knowledge of General Education information will improve Description: Including pain rating scale, medication(s)/side effects and non-pharmacologic comfort measures 07/22/2021 1050 by Dolores Hoose, RN Outcome: Adequate for Discharge 07/22/2021 3832 by Dolores Hoose, RN Outcome: Progressing   Problem: Health Behavior/Discharge Planning: Goal: Ability to manage health-related needs will improve 07/22/2021 1050 by Dolores Hoose, RN Outcome: Adequate for Discharge 07/22/2021 9191 by Dolores Hoose, RN Outcome: Progressing   Problem: Clinical Measurements: Goal: Ability to maintain clinical measurements within normal limits will improve Outcome: Adequate for Discharge Goal: Will remain free from infection 07/22/2021 1050 by Dolores Hoose, RN Outcome: Adequate for Discharge 07/22/2021 6606 by Dolores Hoose, RN Outcome: Progressing Goal: Diagnostic test results will improve Outcome: Adequate for Discharge Goal: Respiratory complications will improve Outcome: Adequate for Discharge Goal: Cardiovascular complication will be avoided Outcome: Adequate for Discharge   Problem: Activity: Goal: Risk for activity intolerance will decrease Outcome: Adequate for Discharge   Problem: Coping: Goal: Level of anxiety will decrease Outcome: Adequate for Discharge   Problem: Elimination: Goal: Will not experience complications related to bowel motility Outcome: Adequate for Discharge Goal: Will not experience complications related to urinary retention Outcome: Adequate for Discharge   Problem: Pain Managment: Goal: General experience of comfort will improve Outcome: Adequate for Discharge   Problem: Safety: Goal: Ability to remain free from injury will improve Outcome: Adequate for Discharge

## 2021-07-22 NOTE — Discharge Summary (Addendum)
Physician Discharge Summary  Antonio [name] PPI:[id number] DOB: 08/28/1946 DOA: 07/13/2021  PCP: [name], MD  Admit date: 07/13/2021 Discharge date: 07/22/2021  Time spent: 40 minutes  Recommendations for Outpatient Follow-up:  Follow outpatient cbc/cmp Hold aspirin/effient while bleeding, resume when ok per urology Follow with urology outpatient for foley catheter removal, follow up for radiation induced bleeding Follow with cardiology outpatient   Follow up insulin regimen with PCP - uses insulin infrequently and based on his BG and how he's feeling - reevaluate regimen outpatient Follow LFT's outpatient, mildly elevated at discharge  Discharge Diagnoses:  Principal Problem:   Hematuria Active Problems:   Type II diabetes mellitus (HCC)   HTN (hypertension)   BPH (benign prostatic hyperplasia)   CAD (coronary artery disease)   Thrombocytopenia ([place])   Discharge Condition: stable  Diet recommendation: heart healthy, diabetic  Filed Weights   07/13/21 1203 07/19/21 2112  Weight: 80.7 kg 81 kg    History of present illness:  75 years old male with PMH significant for prostate cancer treated 20 years ago, CAD s/p CABG , history of GI bleed, hypertension, type 2 diabetes presented in the ED  with gross hematuria.  He had foley catheter placed with urology, but developed issues with catheter draining and obstruction in the setting of hematuria.   He was treated with CBI, but due to persistent bleeding, he had cystoscopy with clot evacuation and fulguration of the prostate and bladder neck.  He was approaching readiness for discharge, but developed CP.  Cardiology was consulted and recommended an echo and to keep Hb above 8.  DAPT will need to be held while he's bleeding.    Stable for outpatient follow up 10/9.   See below for additional details  Hospital Course:  Gross Hematuria  radiation induced bleeding Failed CBI Now s/p fulguration with improvement Aspirin  and effient on hold -> hold until bleeding under control -> urology recommended holding "anticoagulation" until catheter removed next week Hb bumped appropriately with transfusion , still blood tinged output (discussed with urology and ok for outpatient follow up) Plan for voiding trial next Wednesday with urology Consider hyperbaric o2 therapy if persistent   Chest pain /  CAD history of CABG: Aspirin and Effient are kept on hold due to hematuria.  Consider resumption when ok per urology. Continue metoprolol , Crestor, Imdur. CP resolved at this point Echo without WMA - normal EF, grade II diastolic dysfunction Cardiology recommending keep hb >8 (s/p 1 unit pRBC) - no additional cardiac intervention planned No additional cardiac w/u planned Troponin negative   Sepsis secondary to UTI: Patient presented with tachypnea, leukocytosis, Hypotension, lactic acid 2.7, UTI. CT scan shows blood clots in the catheter, no stones or hydronephrosis Patient has received IV fluids as per sepsis protocol. Completed ceftriaxone on 10/7 Blood cultures no growth so far, urine cultures contaminated. Sepsis physiology has resolved.    Type 2 diabetes : Continue sliding scale.   Thrombocytopenia: Platelet count stable.   HTN Continue amlodipine, metop, imdur  Procedures: Echo IMPRESSIONS     1. Left ventricular ejection fraction, by estimation, is 55 to 60%. The  left ventricle has normal function. The left ventricle has no regional  wall motion abnormalities. Left ventricular diastolic parameters are  consistent with Grade II diastolic  dysfunction (pseudonormalization).   2. Right ventricular systolic function is normal. The right ventricular  size is normal.   3. Left atrial size was mildly dilated.   4. Right atrial size was  mildly dilated.   5. The mitral valve is normal in structure. Moderate mitral valve  regurgitation. No evidence of mitral stenosis.   6. Tricuspid valve  regurgitation is moderate.   7. The aortic valve is normal in structure. Aortic valve regurgitation is  not visualized. No aortic stenosis is present.   8. The inferior vena cava is normal in size with greater than 50%  respiratory variability, suggesting right atrial pressure of 3 mmHg.   Comparison(s): No significant change from prior study. Prior images  reviewed side by side.   Consultations: Urology cardiology  Discharge Exam: Vitals:   07/22/21 0535 07/22/21 0812  BP: 116/81 (!) 145/84  Pulse: 61 73  Resp: 18 18  Temp: 98.4 F (36.9 C) 99.2 F (37.3 C)  SpO2: 98% 99%   Feeling well No concerns  General: No acute distress. Cardiovascular: RRR Lungs: unlabored Abdomen: Soft, nontender, nondistended Blood tinged foley output Neurological: Alert and oriented 3. Moves all extremities 4. Cranial nerves II through XII grossly intact. Skin: Warm and dry. No rashes or lesions. Extremities: No clubbing or cyanosis. No edema.  Discharge Instructions   Discharge Instructions     Call MD for:  difficulty breathing, headache or visual disturbances   Complete by: As directed    Call MD for:  difficulty breathing, headache or visual disturbances   Complete by: As directed    Call MD for:  extreme fatigue   Complete by: As directed    Call MD for:  hives   Complete by: As directed    Call MD for:  persistant dizziness or light-headedness   Complete by: As directed    Call MD for:  persistant dizziness or light-headedness   Complete by: As directed    Call MD for:  persistant nausea and vomiting   Complete by: As directed    Call MD for:  persistant nausea and vomiting   Complete by: As directed    Call MD for:  redness, tenderness, or signs of infection (pain, swelling, redness, odor or green/yellow discharge around incision site)   Complete by: As directed    Call MD for:  severe uncontrolled pain   Complete by: As directed    Call MD for:  temperature >100.4    Complete by: As directed    Diet - low sodium heart healthy   Complete by: As directed    Diet - low sodium heart healthy   Complete by: As directed    Diet - low sodium heart healthy   Complete by: As directed    Diet Carb Modified   Complete by: As directed    Discharge instructions   Complete by: As directed    Advised to follow-up with primary care physician in 1 week. Advised to follow-up with urologist in 1 week. Hold your aspirin and effient until the catheter is removed next week.   You'll need to follow up with your urologist in about 1 week.    You were seen for hematuria (blood in your urine).  You had Madeline Pho foley catheter placed with urology and ultimately had Parthena Fergeson cystoscopy with clot evacuation and fulguration of the prostate and bladder neck.  Hold your aspirin and effient until you're told to resume this by urology (after your foley is removed).  You had chest pain and were seen by cardiology.  Your echo was reassuring.  Follow up with cardiology outpatient.    Please follow up with your doctor about your insulin.  It  sounds like you often have lows when you use 45 units, but are now using Tosca Pletz variable amount based on how you feel and your blood sugar.   You only use this maybe Dillie Burandt couple times Jeriann Sayres week.  Please follow up with your primary care doctor about your insulin, it'd be best to get on Jasmynn Pfalzgraf simpler regimen, where you have Rye Decoste dose you feel safe using every day that does not cause lows.   Return for new, recurrent, or worsening symptoms.  Please ask your PCP to request records from this hospitalization so they know what was done and what the next steps will be.   Increase activity slowly   Complete by: As directed    Increase activity slowly   Complete by: As directed    Increase activity slowly   Complete by: As directed       Allergies as of 07/22/2021       Reactions   Demerol Other (See Comments)   hallucinations        Medication List     STOP taking these  medications    aspirin EC 81 MG tablet   GOODY HEADACHE PO   prasugrel 10 MG Tabs tablet Commonly known as: EFFIENT       TAKE these medications    acetaminophen 650 MG CR tablet Commonly known as: TYLENOL Take 650-1,300 mg by mouth every 8 (eight) hours as needed for pain.   amLODipine 5 MG tablet Commonly known as: NORVASC Take 5 mg by mouth every morning.   ferrous sulfate 325 (65 FE) MG tablet Take 1 tablet (325 mg total) by mouth daily.   finasteride 5 MG tablet Commonly known as: PROSCAR Take 1 tablet (5 mg total) by mouth daily.   Insulin Pen Needle 32G X 4 MM Misc Commonly known as: BD Pen Needle Nano U/F 1 pen by Does not apply route daily.   isosorbide mononitrate 60 MG 24 hr tablet Commonly known as: IMDUR TAKE 1 & 1/2 TABLETS (90 MG TOTAL) BY MOUTH DAILY. What changed: See the new instructions.   Levemir FlexTouch 100 UNIT/ML FlexPen Generic drug: insulin detemir Inject 45 Units into the skin daily as needed (CBG >130).   metFORMIN 1000 MG tablet Commonly known as: GLUCOPHAGE Take 1,000 mg by mouth 2 (two) times daily.   metoprolol succinate 50 MG 24 hr tablet Commonly known as: Toprol XL Take 1 tablet (50 mg total) by mouth daily. Take with or immediately following Teagan Ozawa meal.   multivitamin with minerals Tabs tablet Take 1 tablet by mouth every morning.   nitroGLYCERIN 0.4 MG SL tablet Commonly known as: NITROSTAT PLACE 1 TABLET UNDER THE TONGUE EVERY 5 MINUTES FOR 3 DOSES AS NEEDED FOR CHEST PAIN What changed: See the new instructions.   OVER THE COUNTER MEDICATION Apply 1 application topically See admin instructions. OTC anti itch cream - apply topically to left leg every other night   rosuvastatin 40 MG tablet Commonly known as: CRESTOR Take 1 tablet (40 mg total) by mouth daily.   sitaGLIPtin 100 MG tablet Commonly known as: JANUVIA Take 100 mg by mouth every morning.   tamsulosin 0.4 MG Caps capsule Commonly known as: FLOMAX Take  0.4 mg by mouth every morning.       Allergies  Allergen Reactions   Demerol Other (See Comments)    hallucinations    Follow-up Information     Ardis Hughs, MD Follow up on 07/25/2021.   Specialty: Urology Why: Catheter removal  Contact information: [place] [address] [phone]         [name], MD Follow up in 1 week(s).   Specialty: Internal Medicine Contact information: [place], [address] [place] [address] [phone]         Martinique, Peter M, MD .   Specialty: Cardiology Contact information: [address] [place] [address] [phone]                  The results of significant diagnostics from this hospitalization (including imaging, microbiology, ancillary and laboratory) are listed below for reference.    Significant Diagnostic Studies: CT Abdomen Pelvis Wo Contrast  Result Date: 07/13/2021 CLINICAL DATA:  Abdominal distension.  Bladder pain and pressure EXAM: CT ABDOMEN AND PELVIS WITHOUT CONTRAST TECHNIQUE: Multidetector CT imaging of the abdomen and pelvis was performed following the standard protocol without IV contrast. COMPARISON:  07/16/2020 FINDINGS: Lower chest: No acute findings. Hepatobiliary: No mass visualized on this unenhanced exam. Gallbladder is unremarkable. No evidence of biliary ductal dilatation. Pancreas: No mass or inflammatory process visualized on this unenhanced exam. Spleen:  Within normal limits in size. Adrenals/Urinary tract: No evidence of urolithiasis or hydronephrosis. Amberli Ruegg Foley catheter is seen within the bladder which is nondilated. High attenuation substance is seen in the nondependent portion of the urinary bladder surrounding the Foley catheter, consistent with blood clot. Stomach/Bowel: No evidence of obstruction, inflammatory process, or abnormal fluid collections. Normal appendix visualized. Diffuse colonic diverticulosis is again demonstrated, however there  is no evidence of diverticulitis. Vascular/Lymphatic: No pathologically enlarged lymph nodes identified. No evidence of abdominal aortic aneurysm. Aortic atherosclerotic calcification noted. Reproductive: Mildly enlarged prostate gland, with several fiducial markers noted. Other:  None. Musculoskeletal:  No suspicious bone lesions identified. IMPRESSION: Foley catheter in nondilated bladder, with high attenuation substance in nondependent portion of urinary bladder consistent with blood clot. No evidence of urolithiasis or hydronephrosis. Mildly enlarged prostate. Colonic diverticulosis. No radiographic evidence of diverticulitis. Aortic Atherosclerosis (ICD10-I70.0). Electronically Signed   By: Marlaine Hind M.D.   On: 07/13/2021 14:30   ECHOCARDIOGRAM COMPLETE  Result Date: 07/20/2021    ECHOCARDIOGRAM REPORT   Patient Name:   Antonio [name] Date of Exam: 07/20/2021 Medical Rec #:  [id number]        Height:       67.5 in Accession #:    [id number]       Weight:       178.6 lb Date of Birth:  July 04, 1946       BSA:          1.938 m Patient Age:    19 years         BP:           131/76 mmHg Patient Gender: M                HR:           65 bpm. Exam Location:  Inpatient Procedure: 2D Echo, Color Doppler and Cardiac Doppler Indications:    Chest Pain  History:        Patient has prior history of Echocardiogram examinations, most                 recent 07/27/2020. CAD; Risk Factors:Diabetes, Hypertension and                 Dyslipidemia.  Sonographer:    Melissa Morford RDCS (AE, PE) Referring Phys: [id number] [place]  1. Left ventricular ejection fraction, by estimation, is 55 to 60%. The left ventricle has normal function. The left ventricle has no regional wall motion abnormalities. Left ventricular diastolic parameters are consistent with Grade II diastolic dysfunction (pseudonormalization).  2. Right ventricular systolic function is normal. The right ventricular size is normal.  3. Left  atrial size was mildly dilated.  4. Right atrial size was mildly dilated.  5. The mitral valve is normal in structure. Moderate mitral valve regurgitation. No evidence of mitral stenosis.  6. Tricuspid valve regurgitation is moderate.  7. The aortic valve is normal in structure. Aortic valve regurgitation is not visualized. No aortic stenosis is present.  8. The inferior vena cava is normal in size with greater than 50% respiratory variability, suggesting right atrial pressure of 3 mmHg. Comparison(s): No significant change from prior study. Prior images reviewed side by side. FINDINGS  Left Ventricle: Left ventricular ejection fraction, by estimation, is 55 to 60%. The left ventricle has normal function. The left ventricle has no regional wall motion abnormalities. The left ventricular internal cavity size was normal in size. There is  no left ventricular hypertrophy. Left ventricular diastolic parameters are consistent with Grade II diastolic dysfunction (pseudonormalization). Right Ventricle: The right ventricular size is normal. No increase in right ventricular wall thickness. Right ventricular systolic function is normal. Left Atrium: Left atrial size was mildly dilated. Right Atrium: Right atrial size was mildly dilated. Pericardium: There is no evidence of pericardial effusion. Mitral Valve: The mitral valve is normal in structure. Moderate mitral valve regurgitation, with posteriorly-directed jet. No evidence of mitral valve stenosis. Tricuspid Valve: The tricuspid valve is normal in structure. Tricuspid valve regurgitation is moderate . No evidence of tricuspid stenosis. Aortic Valve: The aortic valve is normal in structure. Aortic valve regurgitation is not visualized. No aortic stenosis is present. Pulmonic Valve: The pulmonic valve was normal in structure. Pulmonic valve regurgitation is not visualized. No evidence of pulmonic stenosis. Aorta: The aortic root is normal in size and structure. Venous: The  inferior vena cava is normal in size with greater than 50% respiratory variability, suggesting right atrial pressure of 3 mmHg. IAS/Shunts: No atrial level shunt detected by color flow Doppler.  LEFT VENTRICLE PLAX 2D LVIDd:         4.70 cm   Diastology LVIDs:         3.30 cm   LV e' medial:    5.66 cm/s LV PW:         1.10 cm   LV E/e' medial:  16.6 LV IVS:        1.00 cm   LV e' lateral:   9.25 cm/s LVOT diam:     2.00 cm   LV E/e' lateral: 10.1 LV SV:         51 LV SV Index:   26 LVOT Area:     3.14 cm  RIGHT VENTRICLE RV S prime:     9.90 cm/s TAPSE (M-mode): 1.8 cm LEFT ATRIUM             Index        RIGHT ATRIUM           Index LA diam:        3.90 cm 2.01 cm/m   RA Area:     19.40 cm LA Vol (A2C):   36.5 ml 18.84 ml/m  RA Volume:   58.00 ml  29.93 ml/m LA Vol (A4C):   32.3 ml 16.67 ml/m LA Biplane  Vol: 35.9 ml 18.53 ml/m  AORTIC VALVE LVOT Vmax:   73.80 cm/s LVOT Vmean:  48.200 cm/s LVOT VTI:    0.161 m  AORTA Ao Root diam: 3.30 cm Ao Asc diam:  3.30 cm MITRAL VALVE MV Area (PHT): 4.93 cm    SHUNTS MV Decel Time: 154 msec    Systemic VTI:  0.16 m MV E velocity: 93.80 cm/s  Systemic Diam: 2.00 cm MV Lamberto Dinapoli velocity: 75.40 cm/s MV E/Brandye Inthavong ratio:  1.24 Candee Furbish MD Electronically signed by Candee Furbish MD Signature Date/Time: 07/20/2021/12:38:31 PM    Final     Microbiology: Recent Results (from the past 240 hour(s))  Culture, blood (routine x 2)     Status: None   Collection Time: 07/13/21  1:00 PM   Specimen: BLOOD RIGHT ARM  Result Value Ref Range Status   Specimen Description BLOOD RIGHT ARM  Final   Special Requests   Final    BOTTLES DRAWN AEROBIC AND ANAEROBIC Blood Culture results may not be optimal due to an inadequate volume of blood received in culture bottles   Culture   Final    NO GROWTH 5 DAYS Performed at [facility], [place] [address], [place], [place] [address]    Report Status 07/18/2021 FINAL  Final  Urine Culture     Status: Abnormal   Collection Time: 07/13/21  1:00 PM    Specimen: Urine, Catheterized  Result Value Ref Range Status   Specimen Description URINE, CATHETERIZED  Final   Special Requests   Final    NONE Performed at [facility], [place] [address] [place], [place] [address]    Culture MULTIPLE SPECIES PRESENT, SUGGEST RECOLLECTION (Nyjae Hodge)  Final   Report Status 07/14/2021 FINAL  Final  Culture, blood (routine x 2)     Status: None   Collection Time: 07/13/21  1:08 PM   Specimen: BLOOD LEFT ARM  Result Value Ref Range Status   Specimen Description BLOOD LEFT ARM  Final   Special Requests   Final    BOTTLES DRAWN AEROBIC AND ANAEROBIC Blood Culture adequate volume   Culture   Final    NO GROWTH 5 DAYS Performed at [facility], [place] [address] [place], [place] [address]    Report Status 07/18/2021 FINAL  Final  SARS CORONAVIRUS 2 (TAT 6-24 HRS) Nasopharyngeal Nasopharyngeal Swab     Status: None   Collection Time: 07/13/21 11:09 PM   Specimen: Nasopharyngeal Swab  Result Value Ref Range Status   SARS Coronavirus 2 NEGATIVE NEGATIVE Final    Comment: (NOTE) SARS-CoV-2 target nucleic acids are NOT DETECTED.  The SARS-CoV-2 RNA is generally detectable in upper and lower respiratory specimens during the acute phase of infection. Negative results do not preclude SARS-CoV-2 infection, do not rule out co-infections with other pathogens, and should not be used as the sole basis for treatment or other patient management decisions. Negative results must be combined with clinical observations, patient history, and epidemiological information. The expected result is Negative.  Fact Sheet for Patients: SugarRoll.be  Fact Sheet for Healthcare Providers: https://www.woods-mathews.com/  This test is not yet approved or cleared by the [place] FDA and  has been authorized for detection and/or diagnosis of SARS-CoV-2 by FDA under an Emergency Use Authorization (EUA). This EUA will remain  in  effect (meaning this test can be used) for the duration of the COVID-19 declaration under Se ction 564(b)(1) of the Act, 21 U.S.C. section 360bbb-3(b)(1), unless the authorization  is terminated or revoked sooner.  Performed at [facility], [place] [address], [place], [place] [address]   Surgical pcr screen     Status: None   Collection Time: 07/17/21 10:19 PM   Specimen: Nasal Mucosa; Nasal Swab  Result Value Ref Range Status   MRSA, PCR NEGATIVE NEGATIVE Final   Staphylococcus aureus NEGATIVE NEGATIVE Final    Comment: (NOTE) The Xpert SA Assay (FDA approved for NASAL specimens in patients 59 years of age and older), is one component of Emberlin Verner comprehensive surveillance program. It is not intended to diagnose infection nor to guide or monitor treatment. Performed at [facility], [place] [address] [place], [place] [address]      Labs: Basic Metabolic Panel: Recent Labs  Lab 07/16/21 0138 07/17/21 0435 07/19/21 0324 07/21/21 1441 07/22/21 0104  NA 138 137 135 138 136  K 4.1 3.6 3.8 3.2* 3.8  CL 107 103 101 103 105  CO2 26 25 25 26 23   GLUCOSE 143* 127* 262* 191* 159*  BUN 10 9 12 10 8   CREATININE 1.05 1.05 1.43* 1.24 1.11  CALCIUM 8.6* 8.4* 8.4* 8.4* 8.4*  MG  --  1.7  --  1.7 1.6*  PHOS  --  3.2  --   --  3.2   Liver Function Tests: Recent Labs  Lab 07/22/21 0104  AST 42*  ALT 66*  ALKPHOS 51  BILITOT 0.7  PROT 6.1*  ALBUMIN 2.9*   No results for input(s): LIPASE, AMYLASE in the last 168 hours. No results for input(s): AMMONIA in the last 168 hours. CBC: Recent Labs  Lab 07/18/21 0256 07/18/21 0747 07/19/21 0324 07/20/21 0952 07/21/21 1441 07/22/21 0104  WBC 5.9 6.6 8.2 13.4*  --  8.6  NEUTROABS  --   --   --   --   --  5.3  HGB 7.6* 8.3* 8.2* 7.5* 7.8* 9.0*  HCT 23.3* 25.2* 25.4* 23.1* 24.8* 28.5*  MCV 83.8 84.3 84.7 85.2  --  86.4  PLT 139* 146* 179 175  --  194   Cardiac Enzymes: No results for input(s): CKTOTAL, CKMB, CKMBINDEX,  TROPONINI in the last 168 hours. BNP: BNP (last 3 results) No results for input(s): BNP in the last 8760 hours.  ProBNP (last 3 results) No results for input(s): PROBNP in the last 8760 hours.  CBG: Recent Labs  Lab 07/21/21 1130 07/21/21 1616 07/21/21 2102 07/21/21 2138 07/22/21 0645  GLUCAP 290* 245* 197* 200* 133*       Signed:  Fayrene Helper MD.  [facility] 07/22/2021, 9:30 AM

## 2021-08-02 ENCOUNTER — Other Ambulatory Visit: Payer: Self-pay | Admitting: Cardiology
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
## 2021-08-08 ENCOUNTER — Other Ambulatory Visit: Payer: Self-pay

## 2021-08-08 ENCOUNTER — Ambulatory Visit (INDEPENDENT_AMBULATORY_CARE_PROVIDER_SITE_OTHER): Payer: Medicare Other | Admitting: General Practice

## 2021-08-08 ENCOUNTER — Encounter: Payer: Self-pay | Admitting: General Practice

## 2021-08-08 VITALS — BP 112/64 | HR 74 | Ht 67.5 in | Wt 173.0 lb

## 2021-08-08 DIAGNOSIS — E785 Hyperlipidemia, unspecified: Secondary | ICD-10-CM | POA: Diagnosis not present

## 2021-08-08 DIAGNOSIS — I10 Essential (primary) hypertension: Secondary | ICD-10-CM | POA: Diagnosis not present

## 2021-08-08 DIAGNOSIS — Z794 Long term (current) use of insulin: Secondary | ICD-10-CM

## 2021-08-08 DIAGNOSIS — I25118 Atherosclerotic heart disease of native coronary artery with other forms of angina pectoris: Secondary | ICD-10-CM

## 2021-08-08 DIAGNOSIS — E119 Type 2 diabetes mellitus without complications: Secondary | ICD-10-CM

## 2021-08-08 NOTE — Patient Instructions (Signed)
Medication Instructions:  The current medical regimen is effective;  continue present plan and medications as directed. Please refer to the Current Medication list given to you today.   *If you need a refill on your cardiac medications before your next appointment, please call your pharmacy*  Lab Work:   Testing/Procedures:  NONE    NONE  Special Instructions PLEASE READ AND FOLLOW HEART HEALTHY DIET-ATTACHED  PLEASE INCREASE PHYSICAL ACTIVITY AS TOLERATED,   Follow-Up: Your next appointment:  AS SCHEDULED  In Person with Skeet Latch, MD OR IF UNAVAILABLE [place], FNP-C @ [place]   Please call our office 2 months in advance to schedule this appointment   At [facility], you and your health needs are our priority.  As part of our continuing mission to provide you with exceptional heart care, we have created designated Provider Care Teams.  These Care Teams include your primary Cardiologist (physician) and Advanced Practice Providers (APPs -  Physician Assistants and Nurse Practitioners) who all work together to provide you with the care you need, when you need it.          Heart-Healthy Eating Plan Heart-healthy meal planning includes: Eating less unhealthy fats. Eating more healthy fats. Making other changes in your diet. Talk with your doctor or a diet specialist (dietitian) to create an eating plan that is right for you. What are tips for following this plan? Cooking Avoid frying your food. Try to bake, boil, grill, or broil it instead. You can also reduce fat by: Removing the skin from poultry. Removing all visible fats from meats. Steaming vegetables in water or broth. Meal planning  At meals, divide your plate into four equal parts: Fill one-half of your plate with vegetables and green salads. Fill one-fourth of your plate with whole grains. Fill one-fourth of your plate with lean protein foods. Eat 4-5 servings of vegetables per day. A serving of  vegetables is: 1 cup of raw or cooked vegetables. 2 cups of raw leafy greens. Eat 4-5 servings of fruit per day. A serving of fruit is: 1 medium whole fruit.  cup of dried fruit.  cup of fresh, frozen, or canned fruit.  cup of 100% fruit juice. Eat more foods that have soluble fiber. These are apples, broccoli, carrots, beans, peas, and barley. Try to get 20-30 g of fiber per day. Eat 4-5 servings of nuts, legumes, and seeds per week: 1 serving of dried beans or legumes equals  cup after being cooked. 1 serving of nuts is  cup. 1 serving of seeds equals 1 tablespoon. General information Eat more home-cooked food. Eat less restaurant, buffet, and fast food. Limit or avoid alcohol. Limit foods that are high in starch and sugar. Avoid fried foods. Lose weight if you are overweight. Keep track of how much salt (sodium) you eat. This is important if you have high blood pressure. Ask your doctor to tell you more about this. Try to add vegetarian meals each week. Fats Choose healthy fats. These include olive oil and canola oil, flaxseeds, walnuts, almonds, and seeds. Eat more omega-3 fats. These include salmon, mackerel, sardines, tuna, flaxseed oil, and ground flaxseeds. Try to eat fish at least 2 times each week. Check food labels. Avoid foods with trans fats or high amounts of saturated fat. Limit saturated fats. These are often found in animal products, such as meats, butter, and cream. These are also found in plant foods, such as palm oil, palm kernel oil, and coconut oil. Avoid foods with  partially hydrogenated oils in them. These have trans fats. Examples are stick margarine, some tub margarines, cookies, crackers, and other baked goods. What foods can I eat? Fruits All fresh, canned (in natural juice), or frozen fruits. Vegetables Fresh or frozen vegetables (raw, steamed, roasted, or grilled). Green salads. Grains Most grains. Choose whole wheat and whole grains most of the  time. Rice and pasta, including brown rice and pastas made with whole wheat. Meats and other proteins Lean, well-trimmed beef, veal, pork, and lamb. Chicken and [place] without skin. All fish and shellfish. Wild duck, rabbit, pheasant, and venison. Egg whites or low-cholesterol egg substitutes. Dried beans, peas, lentils, and tofu. Seeds and most nuts. Dairy Low-fat or nonfat cheeses, including ricotta and mozzarella. Skim or 1% milk that is liquid, powdered, or evaporated. Buttermilk that is made with low-fat milk. Nonfat or low-fat yogurt. Fats and oils Non-hydrogenated (trans-free) margarines. Vegetable oils, including soybean, sesame, sunflower, olive, peanut, safflower, corn, canola, and cottonseed. Salad dressings or mayonnaise made with a vegetable oil. Beverages Mineral water. Coffee and tea. Diet carbonated beverages. Sweets and desserts Sherbet, gelatin, and fruit ice. Small amounts of dark chocolate. Limit all sweets and desserts. Seasonings and condiments All seasonings and condiments. The items listed above may not be a complete list of foods and drinks you can eat. Contact a dietitian for more options. What foods should I avoid? Fruits Canned fruit in heavy syrup. Fruit in cream or butter sauce. Fried fruit. Limit coconut. Vegetables Vegetables cooked in cheese, cream, or butter sauce. Fried vegetables. Grains Breads that are made with saturated or trans fats, oils, or whole milk. Croissants. Sweet rolls. Donuts. High-fat crackers, such as cheese crackers. Meats and other proteins Fatty meats, such as hot dogs, ribs, sausage, bacon, rib-eye roast or steak. High-fat deli meats, such as salami and bologna. Caviar. Domestic duck and goose. Organ meats, such as liver. Dairy Cream, sour cream, cream cheese, and creamed cottage cheese. Whole-milk cheeses. Whole or 2% milk that is liquid, evaporated, or condensed. Whole buttermilk. Cream sauce or high-fat cheese sauce. Yogurt that is  made from whole milk. Fats and oils Meat fat, or shortening. Cocoa butter, hydrogenated oils, palm oil, coconut oil, palm kernel oil. Solid fats and shortenings, including bacon fat, salt pork, lard, and butter. Nondairy cream substitutes. Salad dressings with cheese or sour cream. Beverages Regular sodas and juice drinks with added sugar. Sweets and desserts Frosting. Pudding. Cookies. Cakes. Pies. Milk chocolate or white chocolate. Buttered syrups. Full-fat ice cream or ice cream drinks. The items listed above may not be a complete list of foods and drinks to avoid. Contact a dietitian for more information. Summary Heart-healthy meal planning includes eating less unhealthy fats, eating more healthy fats, and making other changes in your diet. Eat a balanced diet. This includes fruits and vegetables, low-fat or nonfat dairy, lean protein, nuts and legumes, whole grains, and heart-healthy oils and fats. This information is not intended to replace advice given to you by your health care provider. Make sure you discuss any questions you have with your health care provider. Document Revised: 02/08/2021 Document Reviewed: 02/08/2021 Elsevier Patient Education  2022 [employer].

## 2021-08-09 ENCOUNTER — Encounter (HOSPITAL_BASED_OUTPATIENT_CLINIC_OR_DEPARTMENT_OTHER): Payer: Medicare Other | Attending: Internal Medicine | Admitting: Internal Medicine

## 2021-08-09 DIAGNOSIS — Z951 Presence of aortocoronary bypass graft: Secondary | ICD-10-CM | POA: Diagnosis not present

## 2021-08-09 DIAGNOSIS — I252 Old myocardial infarction: Secondary | ICD-10-CM | POA: Insufficient documentation

## 2021-08-09 DIAGNOSIS — I2510 Atherosclerotic heart disease of native coronary artery without angina pectoris: Secondary | ICD-10-CM | POA: Diagnosis not present

## 2021-08-09 DIAGNOSIS — I10 Essential (primary) hypertension: Secondary | ICD-10-CM | POA: Diagnosis not present

## 2021-08-09 DIAGNOSIS — Z8546 Personal history of malignant neoplasm of prostate: Secondary | ICD-10-CM | POA: Diagnosis not present

## 2021-08-09 DIAGNOSIS — R350 Frequency of micturition: Secondary | ICD-10-CM | POA: Insufficient documentation

## 2021-08-09 DIAGNOSIS — N3041 Irradiation cystitis with hematuria: Secondary | ICD-10-CM | POA: Insufficient documentation

## 2021-08-09 DIAGNOSIS — Z87891 Personal history of nicotine dependence: Secondary | ICD-10-CM | POA: Diagnosis not present

## 2021-08-09 DIAGNOSIS — E119 Type 2 diabetes mellitus without complications: Secondary | ICD-10-CM | POA: Diagnosis not present

## 2021-08-09 DIAGNOSIS — E785 Hyperlipidemia, unspecified: Secondary | ICD-10-CM | POA: Insufficient documentation

## 2021-08-09 DIAGNOSIS — M1990 Unspecified osteoarthritis, unspecified site: Secondary | ICD-10-CM | POA: Diagnosis not present

## 2021-08-09 DIAGNOSIS — Z923 Personal history of irradiation: Secondary | ICD-10-CM | POA: Insufficient documentation

## 2021-08-09 DIAGNOSIS — Z885 Allergy status to narcotic agent status: Secondary | ICD-10-CM | POA: Diagnosis not present

## 2021-08-09 NOTE — Progress Notes (Signed)
Antonio, [name] ([id number]) Visit Report for 08/09/2021 Abuse/Suicide Risk Screen Details Patient Name: Date of Service: Antonio [name], Antonio [name] [name] 08/09/2021 9:00 A M Medical Record Number: [id number] Patient Account Number: [id number] Date of Birth/Sex: Treating RN: 1946-04-25 (75 y.o. [name] Primary Care Rebecka Oelkers: [name] Other Clinician: Referring Judge Duque: Treating Trentin Knappenberger/Extender: [name] in Treatment: 0 Abuse/Suicide Risk Screen Items Answer ABUSE RISK SCREEN: Has anyone close to you tried to hurt or harm you recentlyo No Do you feel uncomfortable with anyone in your familyo No Has anyone forced you do things that you didnt want to doo No Electronic Signature(s) Signed: 08/09/2021 5:45:17 PM By: Levan Hurst RN, BSN Entered By: Levan Hurst on 08/09/2021 09:07:10 -------------------------------------------------------------------------------- Activities of Daily Living Details Patient Name: Date of Service: Antonio, WILMES [name] S A. 08/09/2021 9:00 A M Medical Record Number: [id number] Patient Account Number: [id number] Date of Birth/Sex: Treating RN: [birth year] (75 y.o. [name] Primary Care Tobe Kervin: [name] Other Clinician: Referring Markela Wee: Treating Christen Wardrop/Extender: [name] in Treatment: 0 Activities of Daily Living Items Answer Activities of Daily Living (Please select one for each item) Drive Automobile Completely Able T Medications ake Completely Able Use T elephone Completely Able Care for Appearance Completely Able Use T oilet Completely Able Bath / Shower Completely Able Dress Self Completely Able Feed Self Completely Able Walk Completely Able Get In / Out Bed Completely Able Housework Completely Able Prepare Meals Completely [place] Completely Able Shop for Self Completely Able Electronic Signature(s) Signed: 08/09/2021 5:45:17 PM By: Levan Hurst RN, BSN Entered By: Levan Hurst on 08/09/2021 09:07:28 -------------------------------------------------------------------------------- Education Screening Details Patient Name: Date of Service: Antonio [place] [name] S A. 08/09/2021 9:00 A M Medical Record Number: [id number] Patient Account Number: [id number] Date of Birth/Sex: Treating RN: 1946/04/15 (75 y.o. [name] Primary Care Ferdinando Lodge: [name] Other Clinician: Referring Rynell Ciotti: Treating Macguire Holsinger/Extender: [name] in Treatment: 0 Primary Learner Assessed: Patient Learning Preferences/Education Level/Primary Language Learning Preference: Explanation, Demonstration, Printed Material Highest Education Level: High School Preferred Language: English Cognitive Barrier Language Barrier: No Translator Needed: No Memory Deficit: No Emotional Barrier: No Cultural/Religious Beliefs Affecting Medical Care: No Physical Barrier Impaired Vision: No Impaired Hearing: No Decreased Hand dexterity: No Knowledge/Comprehension Knowledge Level: High Comprehension Level: High Ability to understand written instructions: High Ability to understand verbal instructions: High Motivation Anxiety Level: Calm Cooperation: Cooperative Education Importance: Acknowledges Need Interest in Health Problems: Asks Questions Perception: Coherent Willingness to Engage in Self-Management High Activities: Readiness to Engage in Self-Management High Activities: Electronic Signature(s) Signed: 08/09/2021 5:45:17 PM By: Levan Hurst RN, BSN Entered By: Levan Hurst on 08/09/2021 09:07:48 -------------------------------------------------------------------------------- Fall Risk Assessment Details Patient Name: Date of Service: Antonio [place] [name] S A. 08/09/2021 9:00 A M Medical Record Number: [id number] Patient Account Number: [id number] Date of Birth/Sex: Treating RN: [birth year] (75 y.o. [name] Primary Care Braxtyn Bojarski: [name] Other Clinician: Referring Adeola Dennen: Treating Oveta Idris/Extender: [name] in Treatment: 0 Fall Risk Assessment Items Have you had 2 or more falls in the last 12 monthso 0 No Have you had any fall that resulted in injury in the last 12 monthso 0 No FALLS RISK SCREEN History of falling - immediate or within 3 months 0 No Secondary diagnosis (Do you have 2 or more medical diagnoseso) 15 Yes Ambulatory aid None/bed rest/wheelchair/nurse 0 Yes Crutches/cane/walker 0 No Furniture 0 No Intravenous therapy Access/Saline/Heparin Lock 0 No Gait/Transferring Normal/ bed  rest/ wheelchair 0 Yes Weak (short steps with or without shuffle, stooped but able to lift head while walking, may seek 0 No support from furniture) Impaired (short steps with shuffle, may have difficulty arising from chair, head down, impaired 0 No balance) Mental Status Oriented to own ability 0 Yes Electronic Signature(s) Signed: 08/09/2021 5:45:17 PM By: Levan Hurst RN, BSN Entered By: Levan Hurst on 08/09/2021 09:08:06 -------------------------------------------------------------------------------- Nutrition Risk Screening Details Patient Name: Date of Service: Antonio [place] [name] S A. 08/09/2021 9:00 A M Medical Record Number: [id number] Patient Account Number: [id number] Date of Birth/Sex: Treating RN: 02-Nov-1945 (75 y.o. [name] Primary Care Jakoby Melendrez: [name] Other Clinician: Referring Quasim Doyon: Treating Foy Mungia/Extender: [name] in Treatment: 0 Height (in): 67 Weight (lbs): 170 Body Mass Index (BMI): 26.6 Nutrition Risk Screening Items Score Screening NUTRITION RISK SCREEN: I have an illness or condition that made me change the kind and/or amount of food I eat 2 Yes I eat fewer than two meals per day 0 No I eat few fruits and vegetables, or milk products 0 No I have three or  more drinks of beer, liquor or wine almost every day 0 No I have tooth or mouth problems that make it hard for me to eat 0 No I don't always have enough money to buy the food I need 0 No I eat alone most of the time 0 No I take three or more different prescribed or over-the-counter drugs a day 1 Yes Without wanting to, I have lost or gained 10 pounds in the last six months 0 No I am not always physically able to shop, cook and/or feed myself 0 No Nutrition Protocols Good Risk Protocol Moderate Risk Protocol 0 Provide education on nutrition High Risk Proctocol Risk Level: Moderate Risk Score: 3 Electronic Signature(s) Signed: 08/09/2021 5:45:17 PM By: Levan Hurst RN, BSN Entered By: Levan Hurst on 08/09/2021 22:48:25

## 2021-08-14 NOTE — Progress Notes (Signed)
Antonio [name], VOSSLER ([id number]) Visit Report for 08/09/2021 Allergy List Details Patient Name: Date of Service: Antonio [name], Antonio [name] [name] 08/09/2021 9:00 A M Medical Record Number: [id number] Patient Account Number: [id number] Date of Birth/Sex: Treating RN: 12/19/1945 (75 y.o. [name] Primary Care Mehul Rudin: [name] Other Clinician: Referring Avant Printy: Treating Anniah Glick/Extender: [name] in Treatment: 0 Allergies Active Allergies Demerol Reaction: hallucinations Severity: Moderate Allergy Notes Electronic Signature(s) Signed: 08/09/2021 5:45:17 PM By: Levan Hurst RN, BSN Entered By: Levan Hurst on 08/09/2021 09:02:33 -------------------------------------------------------------------------------- Arrival Information Details Patient Name: Date of Service: Antonio Baltimore MA S A. 08/09/2021 9:00 A M Medical Record Number: [id number] Patient Account Number: [id number] Date of Birth/Sex: Treating RN: 1946/03/27 (75 y.o. [name] Primary Care Malaiyah Achorn: [name] Other Clinician: Referring Samone Guhl: Treating Rihan Schueler/Extender: [name] in Treatment: 0 Visit Information Patient Arrived: Ambulatory Arrival Time: 09:00 Accompanied By: alone Transfer Assistance: None Patient Identification Verified: Yes Secondary Verification Process Completed: Yes Patient Requires Transmission-Based Precautions: No Patient Has Alerts: No Electronic Signature(s) Signed: 08/09/2021 5:45:17 PM By: Levan Hurst RN, BSN Entered By: Levan Hurst on 08/09/2021 09:01:15 -------------------------------------------------------------------------------- Clinic Level of Care Assessment Details Patient Name: Date of Service: Antonio [place] MA S A. 08/09/2021 9:00 A M Medical Record Number: [id number] Patient Account Number: [id number] Date of Birth/Sex: Treating RN: 22-Aug-1946 (75 y.o. [name] Primary Care  Lafreda Casebeer: [name] Other Clinician: Referring Blondine Hottel: Treating Raylee Adamec/Extender: [name] in Treatment: 0 Clinic Level of Care Assessment Items TOOL 2 Quantity Score X- 1 0 Use when only an EandM is performed on the INITIAL visit ASSESSMENTS - Nursing Assessment / Reassessment X- 1 20 General Physical Exam (combine w/ comprehensive assessment (listed just below) when performed on new pt. evals) X- 1 25 Comprehensive Assessment (HX, ROS, Risk Assessments, Wounds Hx, etc.) ASSESSMENTS - Wound and Skin A ssessment / Reassessment []  - 0 Simple Wound Assessment / Reassessment - one wound []  - 0 Complex Wound Assessment / Reassessment - multiple wounds []  - 0 Dermatologic / Skin Assessment (not related to wound area) ASSESSMENTS - Ostomy and/or Continence Assessment and Care []  - 0 Incontinence Assessment and Management []  - 0 Ostomy Care Assessment and Management (repouching, etc.) PROCESS - Coordination of Care X - Simple Patient / Family Education for ongoing care 1 15 []  - 0 Complex (extensive) Patient / Family Education for ongoing care X- 1 10 Staff obtains Programmer, systems, Records, T Results / Process Orders est []  - 0 Staff telephones HHA, Nursing Homes / Clarify orders / etc []  - 0 Routine Transfer to another Facility (non-emergent condition) []  - 0 Routine Hospital Admission (non-emergent condition) X- 1 15 New Admissions / Biomedical engineer / Ordering NPWT Apligraf, etc. , []  - 0 Emergency Hospital Admission (emergent condition) X- 1 10 Simple Discharge Coordination []  - 0 Complex (extensive) Discharge Coordination PROCESS - Special Needs []  - 0 Pediatric / Minor Patient Management []  - 0 Isolation Patient Management []  - 0 Hearing / Language / Visual special needs []  - 0 Assessment of Community assistance (transportation, D/C planning, etc.) []  - 0 Additional assistance / Altered mentation []  - 0 Support  Surface(s) Assessment (bed, cushion, seat, etc.) INTERVENTIONS - Wound Cleansing / Measurement []  - 0 Wound Imaging (photographs - any number of wounds) []  - 0 Wound Tracing (instead of photographs) []  - 0 Simple Wound Measurement - one wound []  - 0 Complex Wound Measurement - multiple wounds []  - 0 Simple Wound  Cleansing - one wound []  - 0 Complex Wound Cleansing - multiple wounds INTERVENTIONS - Wound Dressings []  - 0 Small Wound Dressing one or multiple wounds []  - 0 Medium Wound Dressing one or multiple wounds []  - 0 Large Wound Dressing one or multiple wounds []  - 0 Application of Medications - injection INTERVENTIONS - Miscellaneous []  - 0 External ear exam []  - 0 Specimen Collection (cultures, biopsies, blood, body fluids, etc.) []  - 0 Specimen(s) / Culture(s) sent or taken to Lab for analysis []  - 0 Patient Transfer (multiple staff / Harrel Lemon Lift / Similar devices) []  - 0 Simple Staple / Suture removal (25 or less) []  - 0 Complex Staple / Suture removal (26 or more) []  - 0 Hypo / Hyperglycemic Management (close monitor of Blood Glucose) []  - 0 Ankle / Brachial Index (ABI) - do not check if billed separately Has the patient been seen at the hospital within the last three years: Yes Total Score: 95 Level Of Care: New/Established - Level 3 Electronic Signature(s) Signed: 08/09/2021 5:45:17 PM By: Levan Hurst RN, BSN Entered By: Levan Hurst on 08/09/2021 09:35:07 -------------------------------------------------------------------------------- Encounter Discharge Information Details Patient Name: Date of Service: Antonio Baltimore MA S A. 08/09/2021 9:00 A M Medical Record Number: [id number] Patient Account Number: [id number] Date of Birth/Sex: Treating RN: Aug 12, 1946 (75 y.o. [name] Primary Care Antonio [name]: [name] Other Clinician: Referring Maitland Lesiak: Treating Antonio [name]/Extender: [name] in Treatment:  0 Encounter Discharge Information Items Discharge Condition: Stable Ambulatory Status: Ambulatory Discharge Destination: Home Transportation: Private Auto Accompanied By: alone Schedule Follow-up Appointment: Yes Clinical Summary of Care: Patient Declined Electronic Signature(s) Signed: 08/09/2021 5:45:17 PM By: Levan Hurst RN, BSN Entered By: Levan Hurst on 08/09/2021 10:24:50 -------------------------------------------------------------------------------- Multi Wound Chart Details Patient Name: Date of Service: Antonio Baltimore MA S A. 08/09/2021 9:00 A M Medical Record Number: [id number] Patient Account Number: [id number] Date of Birth/Sex: Treating RN: 05/29/1946 (75 y.o. [name] Primary Care Kashlynn Kundert: [name] Other Clinician: Referring Yuji Walth: Treating Princeton Nabor/Extender: [name] in Treatment: 0 Vital Signs Height(in): 67 Capillary Blood Glucose(mg/dl): 138 Weight(lbs): 170 Pulse(bpm): 78 Body Mass Index(BMI): 27 Blood Pressure(mmHg): 142/79 Temperature(F): 98.4 Respiratory Rate(breaths/min): 16 Wound Assessments Treatment Notes Electronic Signature(s) Signed: 08/09/2021 5:43:20 PM By: Deon Pilling RN, BSN Signed: 08/14/2021 4:29:54 PM By: [name] MD Entered By: [name] on 08/09/2021 12:55:08 -------------------------------------------------------------------------------- [place] Details Patient Name: Date of Service: Antonio Baltimore MA S A. 08/09/2021 9:00 A M Medical Record Number: [id number] Patient Account Number: [id number] Date of Birth/Sex: Treating RN: [birth year] (75 y.o. 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 with the hyperbaric oxygen chamber Anxiety related to knowledge deficit of hyperbaric oxygen therapy and treatment procedures Discomfort related to temperature and humidity changes inside hyperbaric chamber Potential for barotraumas to ears, sinuses, teeth, and lungs or cerebral gas embolism related to changes in atmospheric pressure inside hyperbaric oxygen chamber Potential for oxygen toxicity seizures related to delivery of 100% oxygen at an increased atmospheric pressure Potential for pulmonary oxygen toxicity related to delivery of 100% oxygen at an increased atmospheric pressure Goals: Barotrauma will be prevented during HBO2 Date Initiated: 08/09/2021 T arget Resolution Date: 10/05/2021 Goal Status: Active Patient and/or family will be able to state/discuss factors appropriate to the management of their disease  process during treatment Date Initiated: 08/09/2021 T arget Resolution Date: 10/05/2021 Goal Status: Active Patient will tolerate the hyperbaric oxygen therapy treatment Date Initiated: 08/09/2021 T arget Resolution Date: 10/05/2021 Goal Status: Active Patient will tolerate the internal climate of the chamber Date Initiated: 08/09/2021 T arget Resolution Date: 10/05/2021 Goal Status: Active Patient/caregiver will verbalize understanding of HBO goals, rationale, procedures and potential hazards Date Initiated: 08/09/2021 T arget Resolution Date: 10/05/2021 Goal Status: Active Signs and symptoms of pulmonary oxygen toxicity will be recognized and promptly addressed Date Initiated: 08/09/2021 T arget Resolution Date: 10/05/2021 Goal Status: Active Signs and symptoms of seizure will be recognized and promptly addressed ; seizing patients will suffer no harm Date Initiated: 08/09/2021 T arget Resolution Date: 10/05/2021 Goal Status: Active Interventions: Administer a five (5) minute air break for patient if signs and symptoms of seizure appear and  notify the hyperbaric physician Administer decongestants, per physician orders, prior to HBO2 Administer the correct therapeutic gas delivery based on the patients needs and limitations, per physician order Assess and provide for patients comfort related to the hyperbaric environment and equalization of middle ear Assess for signs and symptoms related to adverse events, including but not limited to confinement anxiety, pneumothorax, oxygen toxicity and baurotrauma Assess patient for any history of confinement anxiety Assess patient's knowledge and expectations regarding hyperbaric medicine and provide education related to the hyperbaric environment, goals of treatment and prevention of adverse events Implement protocols to decrease risk of pneumothorax in high risk patients Notes: Electronic Signature(s) Signed: 08/09/2021 5:45:17 PM By: Levan Hurst RN, BSN Entered By: Levan Hurst on 08/09/2021 09:31:58 -------------------------------------------------------------------------------- Pain Assessment Details Patient Name: Date of Service: Antonio Baltimore MA S A. 08/09/2021 9:00 A M Medical Record Number: [id number] Patient Account Number: [id number] Date of Birth/Sex: Treating RN: [birth year] (75 y.o. [name] Primary Care Jalasia Eskridge: [name] Other Clinician: Referring Derrion Tritz: Treating Deshauna Cayson/Extender: [name] in Treatment: 0 Active Problems Location of Pain Severity and Description of Pain Patient Has Paino No Site Locations Pain Management and Medication Current Pain Management: Electronic Signature(s) Signed: 08/09/2021 5:45:17 PM By: Levan Hurst RN, BSN Entered By: Levan Hurst on 08/09/2021 09:08:34 -------------------------------------------------------------------------------- Patient/Caregiver Education Details Patient Name: Date of Service: Antonio Baltimore MA S A. 10/27/2022andnbsp9:00 A M Medical Record Number:  [id number] Patient Account Number: [id number] Date of Birth/Gender: Treating RN: 18-Oct-1945 (75 y.o. [name] Primary Care Physician: [name] Other Clinician: Referring Physician: Treating Physician/Extender: [name] in Treatment: 0 Education Assessment Education Provided To: Patient Education Topics Provided Hyperbaric Oxygenation: Handouts: Hyperbaric Oxygen Methods: Explain/Verbal Responses: State content correctly Electronic Signature(s) Signed: 08/09/2021 5:45:17 PM By: Levan Hurst RN, BSN Entered By: Levan Hurst on 08/09/2021 09:33:06 -------------------------------------------------------------------------------- Vitals Details Patient Name: Date of Service: Antonio Baltimore MA S A. 08/09/2021 9:00 A M Medical Record Number: [id number] Patient Account Number: [id number] Date of Birth/Sex: Treating RN: [birth year] (75 y.o. [name] Primary Care Vernica Wachtel: [name] Other Clinician: Referring Latiana Tomei: Treating Laela Deviney/Extender: [name] in Treatment: 0 Vital Signs Time Taken: 09:00 Temperature (F): 98.4 Height (in): 67 Pulse (bpm): 78 Source: Stated Respiratory Rate (breaths/min): 16 Weight (lbs): 170 Blood Pressure (mmHg): 142/79 Source: Stated Capillary Blood Glucose (mg/dl): 138 Body Mass Index (BMI): 26.6 Reference Range: 80 - 120 mg / dl Notes gluocse per pt report Electronic Signature(s) Signed: 08/09/2021 5:45:17 PM By: Levan Hurst RN, BSN Entered By: Levan Hurst on 08/09/2021 09:01:42

## 2021-08-14 NOTE — Progress Notes (Signed)
Antonio [name], Antonio [name] ([id number]) Visit Report for 08/09/2021 Chief Complaint Document Details Patient Name: Date of Service: Antonio [name], Antonio [name] [name] 08/09/2021 9:00 A M Medical Record Number: [id number] Patient Account Number: [id number] Date of Birth/Sex: Treating RN: 1946/08/24 (75 y.o. 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 08/09/2021 9:00 A M Medical Record Number: [id number] Patient Account Number: [id number] Date of Birth/Sex: Treating RN: 12-29-1945 (75 y.o. [name] Primary Care Provider: [name] Other Clinician: Referring Provider: Treating Provider/Extender: [name] in Treatment: 0 History of Present Illness HPI Description: ADMISSION 08/09/2021 This is a 75 year old man who was treated with external beam radiation for cancer of the prostate in 2009. At this point I do not have any of his radiation oncology records. He said over the years he has had 2 prior episodes of bleeding with voiding however they stop spontaneously. In this case his bleeding started about 1-1/2 months ago. He required hospitalization from 07/13/2021 through 07/22/2021 with severe hematuria and clots. he underwent Foley catheter placement and continuous bladder irrigation. However he continued to have gross hematuria. He was therefore taken to cystoscopy and fulguration on 07/18/2021. He was  discharged with a Foley catheter but this was ultimately removed on 10/12. 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 He required fulguration of the prostate and bladder neck. 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 He had a trabeculated bladder with cellules in the trigonal region. The patient has continued to have gross hematuria with clots but he has not been obstructed. He was recommended for hyperbaric oxygen therapy by Dr. Louis Meckel. As noted he did not tolerate having his Foley catheter removed. In spite of the fact his blood thinners were held his bleeding continued Past medical history includes coronary artery disease status post CABG in 1996 but I am unable to get him describe any exertional chest symptoms today. Hypertension, hyperlipidemia, type 2 diabetes, BPH, prostate CA treated with radiation in 2009. Admitted to hospital in 07/19/2020 with unstable angina.. I note that he was in the ER on 05/31/2016 with hematuria and urinary retention Electronic Signature(s) Signed: 08/14/2021 4:29:54 PM By: [name] MD Entered By: [name] on 08/09/2021 13:06:19 -------------------------------------------------------------------------------- Physical Exam Details Patient Name: Date of Service: Antonio Baltimore MA S A. 08/09/2021 9:00 A M Medical Record Number: [id number] Patient Account Number: [id number] Date of Birth/Sex: Treating RN: 1946-09-19 (76 y.o. [name] Primary Care Provider: [name] Other Clinician: Referring Provider: Treating Provider/Extender: [name] in Treatment: 0 Constitutional Sitting or standing Blood Pressure is within target range for patient.. Pulse regular and within target range for patient.Marland Kitchen Respirations regular, non-labored and within target range.. Temperature is normal and within the target range for the patient.Marland Kitchen Appears in  no distress. Respiratory work of breathing is normal. Bilateral breath sounds are clear and equal in all lobes with no wheezes, rales or rhonchi.. Cardiovascular Heart rhythm and rate regular, without murmur or gallop.. Electronic Signature(s) Signed: 08/14/2021 4:29:54 PM By: [name] MD Entered By: [name] on  08/09/2021 13:06:49 -------------------------------------------------------------------------------- Physician Orders Details Patient Name: Date of Service: Antonio [name], Antonio A. 08/09/2021 9:00 A M Medical Record Number: [id number] Patient Account Number: [id number] Date of Birth/Sex: Treating RN: 09-Mar-1946 (75 y.o. Antonio [name] Primary Care Provider: [name] Other Clinician: Referring Provider: Treating Provider/Extender: [name] in Treatment: 0 Verbal / Phone Orders: No Diagnosis Coding Follow-up Appointments ppointment in: - We will call you to schedule date and time to start Hyperbaric Oxygen Therapy once we receive approval from your Return A insurance Hyperbaric Oxygen Therapy Evaluate for HBO Therapy Indication: - Radiation Cystitis If appropriate for treatment, begin HBOT per protocol: 2.5 ATA for 90 Minutes with 2 Five (5) Minute A Breaks ir Total Number of Treatments: - 40 One treatments per day (delivered Monday through Friday unless otherwise specified in Special Instructions below): Finger stick Blood Glucose Pre- and Post- HBOT Treatment. Follow Hyperbaric Oxygen Glycemia Protocol A frin (Oxymetazoline HCL) 0.05% nasal spray - 1 spray in both nostrils daily as needed prior to HBO treatment for difficulty clearing ears GLYCEMIA INTERVENTIONS PROTOCOL PRE-HBO GLYCEMIA INTERVENTIONS ACTION INTERVENTION Obtain pre-HBO capillary blood glucose (ensure 1 physician order is in chart). A. Notify HBO physician and await physician orders. 2 If result is 70 mg/dl or below: B. If the result meets the  hospital definition of a critical result, follow hospital policy. A. Give patient an 8 ounce Glucerna Shake, an 8 ounce Ensure, or 8 ounces of a Glucerna/Ensure equivalent dietary supplement*. B. Wait 30 minutes. If result is 71 mg/dl to 130 mg/dl: C. Retest patients capillary blood glucose (CBG). D. If result greater than or equal to 110 mg/dl, proceed with HBO. If result less than 110 mg/dl, notify HBO physician and consider holding HBO. If result is 131 mg/dl to 249 mg/dl: A. Proceed with HBO. A. Notify HBO physician and await physician orders. B. It is recommended to hold HBO and do If result is 250 mg/dl or greater: blood/urine ketone testing. C. If the result meets the hospital definition of a critical result, follow hospital policy. POST-HBO GLYCEMIA INTERVENTIONS ACTION INTERVENTION Obtain post HBO capillary blood glucose (ensure 1 physician order is in chart). A. Notify HBO physician and await physician orders. 2 If result is 70 mg/dl or below: B. If the result meets the hospital definition of a critical result, follow hospital policy. A. Give patient an 8 ounce Glucerna Shake, an 8 ounce Ensure, or 8 ounces of a Glucerna/Ensure equivalent dietary supplement*. B. Wait 15 minutes for symptoms of If result is 71 mg/dl to 100 mg/dl: hypoglycemia (i.e. nervousness, anxiety, sweating, chills, clamminess, irritability, confusion, tachycardia or dizziness). C. If patient asymptomatic, discharge patient. If patient symptomatic, repeat capillary blood glucose (CBG) and notify HBO physician. If result is 101 mg/dl to 249 mg/dl: A. Discharge patient. A. Notify HBO physician and await physician orders. B. It is recommended to do blood/urine ketone If result is 250 mg/dl or greater: testing. C. If the result meets the hospital definition of a critical result, follow hospital policy. *Juice or candies are NOT equivalent products. If patient refuses the Glucerna or  Ensure, please consult the hospital dietitian for an appropriate substitute. Electronic Signature(s) Signed: 08/09/2021 5:45:17 PM By: Levan Hurst RN, BSN Signed: 08/14/2021 4:29:54 PM By: [name] MD Entered By: Levan Hurst on 08/09/2021 09:40:36 -------------------------------------------------------------------------------- Problem List Details Patient Name: Date of Service: Antonio Baltimore MA S A. 08/09/2021 9:00 A M Medical Record Number: [id number] Patient Account Number: [id number] Date of Birth/Sex:  Treating RN: 05/10/1946 (75 y.o. [name] Primary Care Provider: [name] Other Clinician: Referring Provider: Treating Provider/Extender: [name] in Treatment: 0 Active Problems ICD-10 Encounter Code Description Active Date MDM Diagnosis N30.41 Irradiation cystitis with hematuria 08/09/2021 No Yes Z85.46 Personal history of malignant neoplasm of prostate 08/09/2021 No Yes R35.0 Frequency of micturition 08/09/2021 No Yes Inactive Problems Resolved Problems Electronic Signature(s) Signed: 08/14/2021 4:29:54 PM By: [name] MD Entered By: [name] on 08/09/2021 09:44:45 -------------------------------------------------------------------------------- Progress Note Details Patient Name: Date of Service: Antonio Baltimore MA S A. 08/09/2021 9:00 A M Medical Record Number: [id number] Patient Account Number: [id number] Date of Birth/Sex: Treating RN: 1945/11/16 (75 y.o. 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 At this point I do not have any of his radiation oncology records. He said over the years he has had 2 prior episodes of bleeding with voiding however they stop spontaneously. In this case his bleeding started about 1-1/2 months ago. He required hospitalization from 07/13/2021 through 07/22/2021 with severe hematuria and clots. he underwent Foley catheter placement and continuous bladder irrigation. However he continued to have gross hematuria. He was therefore taken to cystoscopy and fulguration on 07/18/2021. He was discharged with a Foley catheter but this was ultimately removed on 10/12. 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 He required fulguration of the prostate and bladder neck. 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 He had a trabeculated bladder with cellules in the trigonal region. The patient has continued to have gross hematuria with clots but he has not been obstructed. He was recommended for hyperbaric oxygen therapy by Dr. Louis Meckel. As noted he did not tolerate having his Foley catheter removed. In spite of the fact his blood thinners were held his bleeding continued Past medical history includes coronary artery disease status post CABG in 1996 but I am unable to get him describe any exertional chest symptoms today. Hypertension, hyperlipidemia, type 2 diabetes, BPH, prostate CA treated with radiation in 2009. Admitted to hospital in 07/19/2020 with unstable angina.. I note that he was in the ER on 05/31/2016 with hematuria and urinary retention Patient History Information obtained from Patient. Allergies Demerol (Severity: Moderate, Reaction: hallucinations) Family History Unknown History. Social History Former smoker - quit 20 years ago, Marital Status - Married, Alcohol Use - Never, Drug Use - No History, Caffeine Use -  Rarely. Medical History Cardiovascular Patient has history of Coronary Artery Disease - s/p CABG x 2, Hypertension, Myocardial Infarction Endocrine Patient has history of Type II Diabetes Musculoskeletal Patient has history of Osteoarthritis Oncologic Patient has history of Received Radiation - x40 in 2010 Patient is treated with Insulin, Oral Agents. Blood sugar is tested. Review of Systems (ROS) Constitutional Symptoms (General Health) Denies complaints or symptoms of Fatigue, Fever, Chills, Marked Weight Change. Eyes Denies complaints or symptoms of Dry Eyes, Vision Changes, Glasses / Contacts. Ear/Nose/Mouth/Throat Denies complaints or symptoms of Chronic sinus problems or rhinitis. Respiratory Denies complaints or symptoms of Chronic or frequent coughs, Shortness of Breath. Gastrointestinal Denies complaints or symptoms of  Frequent diarrhea, Nausea, Vomiting. Genitourinary Hematuria Integumentary (Skin) Denies complaints or symptoms of Wounds. Neurologic Denies complaints or symptoms of Numbness/parasthesias. Psychiatric Denies complaints or symptoms of Claustrophobia, Suicidal. Objective Constitutional Sitting or standing Blood Pressure is within target range for patient.. Pulse regular and within target range for patient.Marland Kitchen Respirations regular, non-labored and within target range.. Temperature is normal and within the target range for the patient.Marland Kitchen Appears in no distress. Vitals Time Taken: 9:00 AM, Height: 67 in, Source: Stated, Weight: 170 lbs, Source: Stated, BMI: 26.6, Temperature: 98.4 F, Pulse: 78 bpm, Respiratory Rate: 16 breaths/min, Blood Pressure: 142/79 mmHg, Capillary Blood Glucose: 138 mg/dl. General Notes: gluocse per pt report Respiratory work of breathing is normal. Bilateral breath sounds are clear and equal in all lobes with no wheezes, rales or rhonchi.. Cardiovascular Heart rhythm and rate regular, without murmur or gallop.. Assessment Active  Problems ICD-10 Irradiation cystitis with hematuria Personal history of malignant neoplasm of prostate Frequency of micturition Plan Follow-up Appointments: Return Appointment in: - We will call you to schedule date and time to start Hyperbaric Oxygen Therapy once we receive approval from your insurance Hyperbaric Oxygen Therapy: Evaluate for HBO Therapy Indication: - Radiation Cystitis If appropriate for treatment, begin HBOT per protocol: 2.5 ATA for 90 Minutes with 2 Five (5) Minute Air Breaks T Number of Treatments: - 40 otal One treatments per day (delivered Monday through Friday unless otherwise specified in Special Instructions below): Finger stick Blood Glucose Pre- and Post- HBOT Treatment. Follow Hyperbaric Oxygen Glycemia Protocol Afrin (Oxymetazoline HCL) 0.05% nasal spray - 1 spray in both nostrils daily as needed prior to HBO treatment for difficulty clearing ears 1. This patient has radiation cystitis as documented by his recent cystoscopy on 07/13/2021 2. His bleeding is continued in spite of conservative measures including increased fluid intake. A urine culture on 9/30 was also negative. He had a CT scan also on 9/30 so that showed a mildly enlarged prostate. No evidence of hydronephrosis 3. The patient is a candidate for hyperbaric oxygen 2.5 atm for 40 treatments. The goal of therapy here will be eradication of the hematuria. Secondary goals might include reduction of his nocturnal urinary frequency 4. I explained to the patient that we are successful in about 80% of men who presented with this problem. 5. I explained the proposed benefits of hyperbaric oxygen also went over the usual side effects up to including rare but serious side effects from oxygen toxicity. 6. The patient is an ex-smoker. Chest x-ray normal in March. He has not had any exertional chest pain or exertional chest symptoms. Currently his coronary artery disease seems stable. Echocardiogram during his  hospitalization showed an EF of 55 to 60% I spent 45 minutes in review of this patient's past medical history, face-to-face evaluation and preparation of this record Electronic Signature(s) Signed: 08/14/2021 4:29:54 PM By: [name] MD Entered By: [name] on 08/09/2021 13:11:03 -------------------------------------------------------------------------------- HxROS Details Patient Name: Date of Service: Antonio Baltimore MA S A. 08/09/2021 9:00 A M Medical Record Number: [id number] Patient Account Number: [id number] Date of Birth/Sex: Treating RN: [birth year] (75 y.o. Antonio [name] Primary Care Provider: [name] Other Clinician: Referring Provider: Treating Provider/Extender: [name] in Treatment: 0 Information Obtained From Patient Constitutional Symptoms (General Health) Complaints and Symptoms: Negative for: Fatigue; Fever; Chills; Marked Weight Change Eyes Complaints and Symptoms: Negative for: Dry Eyes; Vision Changes; Glasses / Contacts Ear/Nose/Mouth/Throat Complaints and Symptoms: Negative for: Chronic sinus problems or rhinitis Respiratory Complaints and Symptoms:  Negative for: Chronic or frequent coughs; Shortness of Breath Gastrointestinal Complaints and Symptoms: Negative for: Frequent diarrhea; Nausea; Vomiting Integumentary (Skin) Complaints and Symptoms: Negative for: Wounds Neurologic Complaints and Symptoms: Negative for: Numbness/parasthesias Psychiatric Complaints and Symptoms: Negative for: Claustrophobia; Suicidal Hematologic/Lymphatic Cardiovascular Medical History: Positive for: Coronary Artery Disease - s/p CABG x 2; Hypertension; Myocardial Infarction Endocrine Medical History: Positive for: Type II Diabetes Time with diabetes: over 10 years Treated with: Insulin, Oral agents Blood sugar tested every day: Yes Tested : once a day Genitourinary Complaints and Symptoms: Review of System  Notes: Hematuria Immunological Musculoskeletal Medical History: Positive for: Osteoarthritis Oncologic Medical History: Positive for: Received Radiation - x40 in 2010 Immunizations Pneumococcal Vaccine: Received Pneumococcal Vaccination: Yes Received Pneumococcal Vaccination On or After 60th Birthday: Yes Implantable Devices None Family and Social History Unknown History: Yes; Former smoker - quit 20 years ago; Marital Status - Married; Alcohol Use: Never; Drug Use: No History; Caffeine Use: Rarely; Financial Concerns: No; Food, Clothing or Shelter Needs: No; Support System Lacking: No; Transportation Concerns: No Engineer, maintenance) Signed: 08/09/2021 5:45:17 PM By: Levan Hurst RN, BSN Signed: 08/14/2021 4:29:54 PM By: [name] MD Entered By: Levan Hurst on 08/09/2021 09:07:03 -------------------------------------------------------------------------------- SuperBill Details Patient Name: Date of Service: Antonio Baltimore MA S A. 08/09/2021 Medical Record Number: [id number] Patient Account Number: [id number] Date of Birth/Sex: Treating RN: 11-08-1945 (75 y.o. Antonio [name] Primary Care Provider: [name] Other Clinician: Referring Provider: Treating Provider/Extender: [name] in Treatment: 0 Diagnosis Coding ICD-10 Codes Code Description N30.41 Irradiation cystitis with hematuria Z85.46 Personal history of malignant neoplasm of prostate R35.0 Frequency of micturition Facility Procedures CPT4 Code: [id number] Description: [id number] - WOUND CARE VISIT-LEV 3 EST PT Modifier: Quantity: 1 Physician Procedures : CPT4 Code Description Modifier [id number] - WC PHYS LEVEL 4 - NEW PT ICD-10 Diagnosis Description N30.41 Irradiation cystitis with hematuria Z85.46 Personal history of malignant neoplasm of prostate R35.0 Frequency of micturition Quantity: 1 Electronic Signature(s) Signed: 08/14/2021 4:29:54 PM By: [name]  MD Entered By: [name] on 08/09/2021 13:11:20

## 2021-08-22 ENCOUNTER — Encounter (HOSPITAL_BASED_OUTPATIENT_CLINIC_OR_DEPARTMENT_OTHER): Payer: Medicare Other | Admitting: Physician Assistant

## 2021-08-22 ENCOUNTER — Encounter (HOSPITAL_COMMUNITY): Payer: Self-pay

## 2021-08-22 ENCOUNTER — Emergency Department (HOSPITAL_COMMUNITY)
Admission: EM | Admit: 2021-08-22 | Discharge: 2021-08-22 | Disposition: A | Discharge status: Home / Self Care | Payer: Medicare Other | Attending: Emergency Medicine | Admitting: Emergency Medicine

## 2021-08-22 ENCOUNTER — Other Ambulatory Visit: Payer: Self-pay

## 2021-08-22 DIAGNOSIS — Z794 Long term (current) use of insulin: Secondary | ICD-10-CM | POA: Insufficient documentation

## 2021-08-22 DIAGNOSIS — Z8546 Personal history of malignant neoplasm of prostate: Secondary | ICD-10-CM | POA: Insufficient documentation

## 2021-08-22 DIAGNOSIS — Z87891 Personal history of nicotine dependence: Secondary | ICD-10-CM | POA: Insufficient documentation

## 2021-08-22 DIAGNOSIS — R531 Weakness: Secondary | ICD-10-CM | POA: Diagnosis present

## 2021-08-22 DIAGNOSIS — E119 Type 2 diabetes mellitus without complications: Secondary | ICD-10-CM | POA: Insufficient documentation

## 2021-08-22 DIAGNOSIS — Z951 Presence of aortocoronary bypass graft: Secondary | ICD-10-CM | POA: Insufficient documentation

## 2021-08-22 DIAGNOSIS — Z79899 Other long term (current) drug therapy: Secondary | ICD-10-CM | POA: Diagnosis not present

## 2021-08-22 DIAGNOSIS — R42 Dizziness and giddiness: Secondary | ICD-10-CM | POA: Insufficient documentation

## 2021-08-22 DIAGNOSIS — I129 Hypertensive chronic kidney disease with stage 1 through stage 4 chronic kidney disease, or unspecified chronic kidney disease: Secondary | ICD-10-CM | POA: Diagnosis not present

## 2021-08-22 DIAGNOSIS — N189 Chronic kidney disease, unspecified: Secondary | ICD-10-CM | POA: Diagnosis not present

## 2021-08-22 DIAGNOSIS — I2510 Atherosclerotic heart disease of native coronary artery without angina pectoris: Secondary | ICD-10-CM | POA: Diagnosis not present

## 2021-08-22 LAB — CBC WITH DIFFERENTIAL/PLATELET
Abs Immature Granulocytes: 0.02 10*3/uL (ref 0.00–0.07)
Basophils Absolute: 0 10*3/uL (ref 0.0–0.1)
Basophils Relative: 0 %
Eosinophils Absolute: 0.1 10*3/uL (ref 0.0–0.5)
Eosinophils Relative: 3 %
HCT: 31.4 % — ABNORMAL LOW (ref 39.0–52.0)
Hemoglobin: 9.5 g/dL — ABNORMAL LOW (ref 13.0–17.0)
Immature Granulocytes: 0 %
Lymphocytes Relative: 15 %
Lymphs Abs: 0.8 10*3/uL (ref 0.7–4.0)
MCH: 26.5 pg (ref 26.0–34.0)
MCHC: 30.3 g/dL (ref 30.0–36.0)
MCV: 87.5 fL (ref 80.0–100.0)
Monocytes Absolute: 0.3 10*3/uL (ref 0.1–1.0)
Monocytes Relative: 6 %
Neutro Abs: 4.1 10*3/uL (ref 1.7–7.7)
Neutrophils Relative %: 76 %
Platelets: 185 10*3/uL (ref 150–400)
RBC: 3.59 MIL/uL — ABNORMAL LOW (ref 4.22–5.81)
RDW: 17.5 % — ABNORMAL HIGH (ref 11.5–15.5)
WBC: 5.4 10*3/uL (ref 4.0–10.5)
nRBC: 0 % (ref 0.0–0.2)

## 2021-08-22 LAB — BASIC METABOLIC PANEL
Anion gap: 9 (ref 5–15)
BUN: 12 mg/dL (ref 8–23)
CO2: 24 mmol/L (ref 22–32)
Calcium: 8.5 mg/dL — ABNORMAL LOW (ref 8.9–10.3)
Chloride: 102 mmol/L (ref 98–111)
Creatinine, Ser: 1.02 mg/dL (ref 0.61–1.24)
GFR, Estimated: >60 mL/min (ref >60)
Glucose, Bld: 194 mg/dL — ABNORMAL HIGH (ref 70–99)
Potassium: 3.7 mmol/L (ref 3.5–5.1)
Sodium: 135 mmol/L (ref 135–145)

## 2021-08-22 LAB — SAMPLE TO BLOOD BANK

## 2021-08-22 LAB — TROPONIN I (HIGH SENSITIVITY)
Troponin I (High Sensitivity): 8 ng/L (ref <18)
Troponin I (High Sensitivity): 7 ng/L (ref <18)

## 2021-08-22 NOTE — Discharge Instructions (Signed)
Your work-up was reassuring today.  Your hemoglobin is higher than it was when you left.  Follow-up with urology and your primary care doctor.
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
## 2021-08-22 NOTE — ED Triage Notes (Signed)
Pt BIB EMS from home c/o of generalized weakness. Pt states he was ambulating to the bathroom and felt like he was, "getting ready to fall." Family observed this and told EMS he was stumbling towards the bathroom.   Pt has a recent hx of a hemorrhaged bladder which he received a blood transfusion for.   Pt was supposed to have an appointment for the hyperbaric chamber today, but is unable due to today's visit.   VSS stable with EMS.

## 2021-08-22 NOTE — ED Notes (Signed)
Discharge instructions, follow up care reviewed and explained. Pt verbalized understanding.

## 2021-08-23 ENCOUNTER — Encounter (HOSPITAL_BASED_OUTPATIENT_CLINIC_OR_DEPARTMENT_OTHER): Payer: Medicare Other | Attending: Internal Medicine | Admitting: Internal Medicine

## 2021-08-23 DIAGNOSIS — Z8546 Personal history of malignant neoplasm of prostate: Secondary | ICD-10-CM | POA: Diagnosis not present

## 2021-08-23 DIAGNOSIS — Y842 Radiological procedure and radiotherapy as the cause of abnormal reaction of the patient, or of later complication, without mention of misadventure at the time of the procedure: Secondary | ICD-10-CM | POA: Diagnosis not present

## 2021-08-23 DIAGNOSIS — N3041 Irradiation cystitis with hematuria: Secondary | ICD-10-CM | POA: Diagnosis not present

## 2021-08-23 DIAGNOSIS — E119 Type 2 diabetes mellitus without complications: Secondary | ICD-10-CM | POA: Diagnosis not present

## 2021-08-23 LAB — GLUCOSE, CAPILLARY: Glucose-Capillary: 214 mg/dL — ABNORMAL HIGH (ref 70–99)

## 2021-08-23 NOTE — Progress Notes (Addendum)
Antonio [name], Antonio [name] ([id number]) Visit Report for 08/23/2021 HBO Details Patient Name: Date of Service: Antonio [name], Antonio [name] [name]. 08/23/2021 10:00 [name] Medical Record Number: [id number] Patient Account Number: [id number] Date of Birth/Sex: Treating RN: [birth year] (75 y.o. [name], [name] Primary Care Poet Hineman: [name] Other Clinician: [name] Referring Alyson Ki: Treating Uriel Dowding/Extender: [name] in Treatment: 2 HBO Treatment Course Details Treatment Course Number: 1 Ordering Rayshell Goecke: Bernerd Pho Treatments Ordered: otal 40 HBO Treatment Start Date: 08/23/2021 HBO Indication: Late Effect of Radiation HBO Treatment Details Treatment Number: 1 Patient Type: Outpatient Chamber Type: Monoplace Chamber Serial #: [id number] Treatment Protocol: 2.0 ATA with 90 minutes oxygen, and no air breaks Treatment Details Compression Rate Down: 1.0 psi / minute De-Compression Rate Up: 1.0 psi / minute Air breaks and breathing Decompress Decompress Compress Tx Pressure Begins Reached periods Begins Ends (leave unused spaces blank) Chamber Pressure (ATA 1 2 ------2 1 ) Clock Time (24 hr) 11:18 11:33 - - - - - - 13:03 13:18 Treatment Length: 120 (minutes) Treatment Segments: 4 Vital Signs Capillary Blood Glucose Reference Range: 80 - 120 mg / dl HBO Diabetic Blood Glucose Intervention Range: <131 mg/dl or >249 mg/dl Type: Time Vitals Blood Pulse: Respiratory Temperature: Capillary Blood Glucose Pulse Action Taken: Pressure: Rate: Glucose (mg/dl): Meter #: Oximetry (%) Taken: Pre 10:39 145/77 75 16 98.5 214 Post 13:37 181/86 82 16 97.8 111 Patient discharged per [facility] protocol. Treatment Response Treatment Toleration: Well Treatment Completion Status: Treatment Completed without Adverse Event Yuka Lallier Notes The patient was seen for evaluation prefers treatment. He has significant coronary artery disease status post CABG and stenting  although his ejection fraction has been normal. He completely denies any exertional symptoms. Respiratory and cardiac exams were normal.. Tympanic membranes also look normal. He tolerated his treatment well. No complaints Physician HBO Attestation: I certify that I supervised this HBO treatment in accordance with Medicare guidelines. [name] trained emergency response team is readily available per Yes hospital policies and procedures. Continue HBOT as ordered. Yes Electronic Signature(s) Signed: 08/23/2021 5:10:51 PM By: [name] MD Previous Signature: 08/23/2021 2:53:21 PM Version By: [name] CHT EMT BS , , Entered By: [name] on 08/23/2021 17:09:35 -------------------------------------------------------------------------------- HBO Safety Checklist Details Patient Name: Date of Service: Antonio [name]. 08/23/2021 10:00 [name] Medical Record Number: [id number] Patient Account Number: [id number] Date of Birth/Sex: Treating RN: August 26, 1946 (75 y.o. [name], [name] Primary Care Lashica Hannay: [name] Other Clinician: [name] Referring Theola Cuellar: Treating Darryll Raju/Extender: [name] in Treatment: 2 HBO Safety Checklist Items Safety Checklist Consent Form Signed Patient voided / foley secured and emptied When did you last eato Breakfast Last dose of injectable or oral agent 0900 Ostomy pouch emptied and vented if applicable NA All implantable devices assessed, documented and approved NA Intravenous access site secured and place NA Valuables secured Linens and cotton and cotton/polyester blend (less than 51% polyester) Personal oil-based products / skin lotions / body lotions removed Wigs or hairpieces removed NA Smoking or tobacco materials removed NA Books / newspapers / magazines / loose paper removed Cologne, aftershave, perfume and deodorant removed Jewelry removed (may wrap wedding band) Make-up  removed NA Hair care products removed Battery operated devices (external) removed Heating patches and chemical warmers removed Titanium eyewear removed Nail polish cured greater than 10 hours NA Casting material cured greater than 10 hours NA Hearing aids removed NA Loose dentures or partials removed Full dentures Prosthetics have been removed NA  Patient demonstrates correct use of air break device (if applicable) Patient concerns have been addressed Patient grounding bracelet on and cord attached to chamber Specifics for Inpatients (complete in addition to above) Medication sheet sent with patient NA Intravenous medications needed or due during therapy sent with patient NA Drainage tubes (e.g. nasogastric tube or chest tube secured and vented) NA Endotracheal or Tracheotomy tube secured NA Cuff deflated of air and inflated with saline NA Airway suctioned NA Electronic Signature(s) Signed: 08/23/2021 2:45:14 PM By: [name] CHT EMT BS , , Entered By: [name] on 08/23/2021 14:45:14

## 2021-08-23 NOTE — Progress Notes (Signed)
Antonio [name], Antonio [name] ([id number]) Visit Report for 08/23/2021 SuperBill Details Patient Name: Date of Service: [name], Antonio [place] 08/23/2021 Medical Record Number: [id number] Patient Account Number: [id number] Date of Birth/Sex: Treating RN: 1946-09-12 (75 y.o. 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

## 2021-08-23 NOTE — Progress Notes (Addendum)
Antonio [name] ([id number]) Visit Report for 08/23/2021 Arrival Information Details Patient Name: Date of Service: Antonio [name] [name] 08/23/2021 10:00 A M Medical Record Number: [id number] Patient Account Number: [id number] Date of Birth/Sex: Treating RN: 1945/12/23 (75 y.o. Antonio [name], [name] Primary Care Lexii Walsh: [name] Other Clinician: [name] Referring Rudie Rikard: Treating Kimm Ungaro/Extender: [name] in Treatment: 2 Visit Information History Since Last Visit All ordered tests and consults were completed: Yes Patient Arrived: Ambulatory Added or deleted any medications: No Arrival Time: 10:14 Any new allergies or adverse reactions: No Accompanied By: self Had a fall or experienced change in No Transfer Assistance: None activities of daily living that may affect Patient Identification Verified: Yes risk of falls: Secondary Verification Process Completed: Yes Signs or symptoms of abuse/neglect since last visito No Patient Requires Transmission-Based Precautions: No Hospitalized since last visit: No Patient Has Alerts: No Implantable device outside of the clinic excluding No cellular tissue based products placed in the center since last visit: Pain Present Now: No Electronic Signature(s) Signed: 08/23/2021 2:42:24 PM By: [name] CHT EMT BS , , Entered By: [name] on 08/23/2021 14:42:24 -------------------------------------------------------------------------------- Encounter Discharge Information Details Patient Name: Date of Service: Antonio [place] MA S A. 08/23/2021 10:00 A M Medical Record Number: [id number] Patient Account Number: [id number] Date of Birth/Sex: Treating RN: [birth year] (75 y.o. Antonio [name] Primary Care Rachelann Enloe: [name] Other Clinician: [name] Referring Kaeleigh Westendorf: Treating Ailin Rochford/Extender: [name] in Treatment: 2 Encounter  Discharge Information Items Discharge Condition: Stable Ambulatory Status: Ambulatory Discharge Destination: Home Transportation: Private Auto Accompanied By: self Schedule Follow-up Appointment: No Clinical Summary of Care: Electronic Signature(s) Signed: 08/23/2021 4:14:11 PM By: [name] CHT EMT BS , , Entered By: [name] on 08/23/2021 16:14:11 -------------------------------------------------------------------------------- Vitals Details Patient Name: Date of Service: Antonio [place] MA S A. 08/23/2021 10:00 A M Medical Record Number: [id number] Patient Account Number: [id number] Date of Birth/Sex: Treating RN: 01-Apr-1946 (75 y.o. Antonio [name], Antonio [name] Primary Care Kashton Mcartor: [name] Other Clinician: [name] Referring Haivyn Oravec: Treating Maleak Brazzel/Extender: [name] in Treatment: 2 Vital Signs Time Taken: 10:39 Temperature (F): 98.5 Height (in): 67 Pulse (bpm): 75 Weight (lbs): 170 Respiratory Rate (breaths/min): 16 Body Mass Index (BMI): 26.6 Blood Pressure (mmHg): 145/77 Capillary Blood Glucose (mg/dl): 214 Reference Range: 80 - 120 mg / dl Electronic Signature(s) Signed: 08/23/2021 2:43:12 PM By: [name] CHT EMT BS , , Entered By: [name] on 08/23/2021 14:43:12

## 2021-08-24 ENCOUNTER — Encounter (HOSPITAL_BASED_OUTPATIENT_CLINIC_OR_DEPARTMENT_OTHER): Payer: Medicare Other | Admitting: Internal Medicine

## 2021-08-24 ENCOUNTER — Other Ambulatory Visit: Payer: Self-pay

## 2021-08-24 DIAGNOSIS — Z8546 Personal history of malignant neoplasm of prostate: Secondary | ICD-10-CM

## 2021-08-24 DIAGNOSIS — R350 Frequency of micturition: Secondary | ICD-10-CM

## 2021-08-24 DIAGNOSIS — N3041 Irradiation cystitis with hematuria: Secondary | ICD-10-CM | POA: Diagnosis not present

## 2021-08-24 LAB — GLUCOSE, CAPILLARY
Glucose-Capillary: 111 mg/dL — ABNORMAL HIGH (ref 70–99)
Glucose-Capillary: 238 mg/dL — ABNORMAL HIGH (ref 70–99)
Glucose-Capillary: 268 mg/dL — ABNORMAL HIGH (ref 70–99)

## 2021-08-24 NOTE — Progress Notes (Addendum)
Antonio, [name] ([id number]) Visit Report for 08/24/2021 Arrival Information Details Patient Name: Date of Service: Antonio, [name] [name] 08/24/2021 9:30 A M Medical Record Number: [id number] Patient Account Number: [id number] Date of Birth/Sex: Treating RN: 1946/03/10 (75 y.o. Antonio [name], [name] Primary Care Sullivan Blasing: [name] Other Clinician: [name] Referring Nomi Rudnicki: Treating Callyn Severtson/Extender: Krista Blue in Treatment: 2 Visit Information History Since Last Visit All ordered tests and consults were completed: Yes Patient Arrived: Ambulatory Added or deleted any medications: No Arrival Time: 08:51 Any new allergies or adverse reactions: No Accompanied By: self Had a fall or experienced change in No Transfer Assistance: None activities of daily living that may affect Patient Identification Verified: Yes risk of falls: Secondary Verification Process Completed: Yes Signs or symptoms of abuse/neglect since last visito No Patient Requires Transmission-Based Precautions: No Hospitalized since last visit: No Patient Has Alerts: No Implantable device outside of the clinic excluding No cellular tissue based products placed in the center since last visit: Pain Present Now: No Electronic Signature(s) Signed: 08/24/2021 9:42:03 AM By: [name] CHT EMT BS , , Entered By: [name] on 08/24/2021 09:42:03 -------------------------------------------------------------------------------- Encounter Discharge Information Details Patient Name: Date of Service: Antonio [place] MA S A. 08/24/2021 9:30 A M Medical Record Number: [id number] Patient Account Number: [id number] Date of Birth/Sex: Treating RN: 1946/09/23 (75 y.o. Antonio [name] Primary Care Alichia Alridge: [name] Other Clinician: [name] Referring Zain Bingman: Treating Megon Kalina/Extender: Krista Blue in Treatment: 2 Encounter  Discharge Information Items Discharge Condition: Stable Ambulatory Status: Ambulatory Discharge Destination: Home Transportation: Private Auto Accompanied By: self Schedule Follow-up Appointment: No Clinical Summary of Care: Electronic Signature(s) Signed: 08/24/2021 12:06:14 PM By: [name] CHT EMT BS , , Entered By: [name] on 08/24/2021 12:06:14 -------------------------------------------------------------------------------- Vitals Details Patient Name: Date of Service: Antonio [place] MA S A. 08/24/2021 9:30 A M Medical Record Number: [id number] Patient Account Number: [id number] Date of Birth/Sex: Treating RN: 09-26-1946 (75 y.o. Antonio [name] Primary Care Olivea Sonnen: [name] Other Clinician: [name] Referring Lashauna Arpin: Treating Che Rachal/Extender: Krista Blue in Treatment: 2 Vital Signs Time Taken: 08:55 Temperature (F): 98.6 Height (in): 67 Pulse (bpm): 72 Weight (lbs): 170 Respiratory Rate (breaths/min): 14 Body Mass Index (BMI): 26.6 Blood Pressure (mmHg): 124/64 Capillary Blood Glucose (mg/dl): 238 Reference Range: 80 - 120 mg / dl Electronic Signature(s) Signed: 08/24/2021 9:42:42 AM By: [name] CHT EMT BS , , Entered By: [name] on 08/24/2021 09:42:42

## 2021-08-24 NOTE — Progress Notes (Addendum)
Antonio [name] ([id number]) Visit Report for 08/24/2021 HBO Details Patient Name: Date of Service: Antonio [name], Antonio [name] [name]. 08/24/2021 9:30 [name] Medical Record Number: [id number] Patient Account Number: [id number] Date of Birth/Sex: Treating RN: 1946-08-06 (75 y.o. [name] Primary Care Trang Bouse: [name] Other Clinician: [name] Referring Dallis Darden: Treating Makari Portman/Extender: Krista Blue in Treatment: 2 HBO Treatment Course Details Treatment Course Number: 1 Ordering Jarred Purtee: Bernerd Pho Treatments Ordered: otal 40 HBO Treatment Start Date: 08/23/2021 HBO Indication: Late Effect of Radiation HBO Treatment Details Treatment Number: 2 Patient Type: Outpatient Chamber Type: Monoplace Chamber Serial #: [id number] Treatment Protocol: 2.0 ATA with 90 minutes oxygen, and no air breaks Treatment Details Compression Rate Down: 1.0 psi / minute De-Compression Rate Up: 1.0 psi / minute Air breaks and breathing Decompress Decompress Compress Tx Pressure Begins Reached periods Begins Ends (leave unused spaces blank) Chamber Pressure (ATA 1 2 ------2 1 ) Clock Time (24 hr) 09:17 09:33 - - - - - - 11:03 11:18 Treatment Length: 121 (minutes) Treatment Segments: 4 Vital Signs Capillary Blood Glucose Reference Range: 80 - 120 mg / dl HBO Diabetic Blood Glucose Intervention Range: <131 mg/dl or >249 mg/dl Time Vitals Blood Respiratory Capillary Blood Glucose Pulse Action Type: Pulse: Temperature: Taken: Pressure: Rate: Glucose (mg/dl): Meter #: Oximetry (%) Taken: Pre 08:55 124/64 72 14 98.6 238 Post 11:24 138/60 71 14 98.2 268 Treatment Response Treatment Toleration: Well Treatment Completion Status: Treatment Completed without Adverse Event Physician HBO Attestation: I certify that I supervised this HBO treatment in accordance with Medicare guidelines. [name] trained emergency response team is readily available per  Yes hospital policies and procedures. Continue HBOT as ordered. Yes Electronic Signature(s) Signed: 08/24/2021 1:01:54 PM By: Kalman Shan DO Previous Signature: 08/24/2021 12:05:17 PM Version By: [name] CHT EMT BS , , Entered By: Kalman Shan on 08/24/2021 13:01:21 -------------------------------------------------------------------------------- HBO Safety Checklist Details Patient Name: Date of Service: Antonio [name]. 08/24/2021 9:30 [name] Medical Record Number: [id number] Patient Account Number: [id number] Date of Birth/Sex: Treating RN: 1945-12-08 (75 y.o. [name] Primary Care Reco Shonk: [name] Other Clinician: [name] Referring Taron Mondor: Treating Ernie Sagrero/Extender: Krista Blue in Treatment: 2 HBO Safety Checklist Items Safety Checklist Consent Form Signed Patient voided / foley secured and emptied When did you last eato 0800 Last dose of injectable or oral agent 0800 Ostomy pouch emptied and vented if applicable NA All implantable devices assessed, documented and approved NA Intravenous access site secured and place NA Valuables secured Linens and cotton and cotton/polyester blend (less than 51% polyester) Personal oil-based products / skin lotions / body lotions removed Wigs or hairpieces removed NA Smoking or tobacco materials removed NA Books / newspapers / magazines / loose paper removed Cologne, aftershave, perfume and deodorant removed Jewelry removed (may wrap wedding band) Make-up removed NA Hair care products removed Battery operated devices (external) removed Heating patches and chemical warmers removed Titanium eyewear removed NA Nail polish cured greater than 10 hours NA Casting material cured greater than 10 hours NA Hearing aids removed NA Loose dentures or partials removed dentures removed Prosthetics have been removed NA Patient demonstrates correct use of air  break device (if applicable) Patient concerns have been addressed Patient grounding bracelet on and cord attached to chamber Specifics for Inpatients (complete in addition to above) Medication sheet sent with patient NA Intravenous medications needed or due during therapy sent with patient NA Drainage tubes (e.g. nasogastric tube or chest  tube secured and vented) NA Endotracheal or Tracheotomy tube secured NA Cuff deflated of air and inflated with saline NA Airway suctioned NA Notes Paper version used prior to treatment. Electronic Signature(s) Signed: 08/24/2021 12:03:19 PM By: [name] CHT EMT BS , , Previous Signature: 08/24/2021 9:44:00 AM Version By: [name] CHT EMT BS , , Entered By: [name] on 08/24/2021 12:03:18

## 2021-08-24 NOTE — Progress Notes (Signed)
LYNELL, KUSSMAN ([id number]) Visit Report for 08/24/2021 SuperBill Details Patient Name: Date of Service: ISRRAEL, FLUCKIGER [place] 08/24/2021 Medical Record Number: [id number] Patient Account Number: [id number] Date of Birth/Sex: Treating RN: [birth year] (75 y.o. 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

## 2021-08-27 ENCOUNTER — Other Ambulatory Visit: Payer: Self-pay

## 2021-08-27 ENCOUNTER — Encounter (HOSPITAL_BASED_OUTPATIENT_CLINIC_OR_DEPARTMENT_OTHER): Payer: Medicare Other | Admitting: Internal Medicine

## 2021-08-27 DIAGNOSIS — Z8546 Personal history of malignant neoplasm of prostate: Secondary | ICD-10-CM

## 2021-08-27 DIAGNOSIS — N3041 Irradiation cystitis with hematuria: Secondary | ICD-10-CM | POA: Diagnosis not present

## 2021-08-27 DIAGNOSIS — R350 Frequency of micturition: Secondary | ICD-10-CM | POA: Diagnosis not present

## 2021-08-27 LAB — GLUCOSE, CAPILLARY
Glucose-Capillary: 180 mg/dL — ABNORMAL HIGH (ref 70–99)
Glucose-Capillary: 170 mg/dL — ABNORMAL HIGH (ref 70–99)

## 2021-08-27 NOTE — Progress Notes (Addendum)
MAHDI, FRYE ([id number]) Visit Report for 08/27/2021 Arrival Information Details Patient Name: Date of Service: Antonio [name], Antonio [name] [name] 08/27/2021 9:00 A M Medical Record Number: [id number] Patient Account Number: [id number] Date of Birth/Sex: Treating RN: Jun 09, 1946 (75 y.o. [name] Primary Care Valisa Karpel: [name] Other Clinician: [name] Referring Kagan Mutchler: Treating Pranit Owensby/Extender: Krista Blue in Treatment: 2 Visit Information History Since Last Visit All ordered tests and consults were completed: Yes Patient Arrived: Ambulatory Added or deleted any medications: No Arrival Time: 09:20 Any new allergies or adverse reactions: No Accompanied By: self Had a fall or experienced change in No Transfer Assistance: None activities of daily living that may affect Patient Identification Verified: Yes risk of falls: Secondary Verification Process Completed: Yes Signs or symptoms of abuse/neglect since last visito No Patient Requires Transmission-Based Precautions: No Hospitalized since last visit: No Patient Has Alerts: No Implantable device outside of the clinic excluding No cellular tissue based products placed in the center since last visit: Pain Present Now: No Electronic Signature(s) Signed: 08/27/2021 11:12:06 AM By: [name] CHT EMT BS , , Entered By: [name] on 08/27/2021 11:12:06 -------------------------------------------------------------------------------- Encounter Discharge Information Details Patient Name: Date of Service: Antonio Baltimore MA S A. 08/27/2021 9:00 A M Medical Record Number: [id number] Patient Account Number: [id number] Date of Birth/Sex: Treating RN: June 22, 1946 (75 y.o. [name] Primary Care Abdias Hickam: [name] Other Clinician: [name] Referring Karstyn Birkey: Treating Dontaye Hur/Extender: Krista Blue in Treatment: 2 Encounter  Discharge Information Items Discharge Condition: Stable Ambulatory Status: Ambulatory Discharge Destination: Home Transportation: Private Auto Accompanied By: self Schedule Follow-up Appointment: No Clinical Summary of Care: Electronic Signature(s) Signed: 08/27/2021 1:34:01 PM By: [name] CHT EMT BS , , Entered By: [name] on 08/27/2021 13:34:00 -------------------------------------------------------------------------------- Vitals Details Patient Name: Date of Service: Antonio Baltimore MA S A. 08/27/2021 9:00 A M Medical Record Number: [id number] Patient Account Number: [id number] Date of Birth/Sex: Treating RN: 1946-05-02 (75 y.o. [name] Primary Care Cyril Railey: [name] Other Clinician: [name] Referring Jasiya Antonio [name]: Treating Charlea Nardo/Extender: Krista Blue in Treatment: 2 Vital Signs Time Taken: 09:22 Temperature (F): 98.5 Height (in): 67 Pulse (bpm): 80 Weight (lbs): 170 Respiratory Rate (breaths/min): 16 Body Mass Index (BMI): 26.6 Blood Pressure (mmHg): 129/79 Capillary Blood Glucose (mg/dl): 180 Reference Range: 80 - 120 mg / dl Electronic Signature(s) Signed: 08/27/2021 11:12:53 AM By: [name] CHT EMT BS , , Entered By: [name] on 08/27/2021 11:12:53

## 2021-08-27 NOTE — Progress Notes (Signed)
Antonio [name], Antonio [name] ([id number]) Visit Report for 08/27/2021 SuperBill Details Patient Name: Date of Service: Antonio [name], Antonio [name] [place] 08/27/2021 Medical Record Number: [id number] Patient Account Number: [id number] Date of Birth/Sex: Treating RN: [birth year] (75 y.o. 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

## 2021-08-27 NOTE — Progress Notes (Addendum)
Antonio [name], Antonio [name] ([id number]) Visit Report for 08/27/2021 HBO Details Patient Name: Date of Service: Antonio [name], Antonio [name] [name]. 08/27/2021 9:00 [name] Medical Record Number: [id number] Patient Account Number: [id number] Date of Birth/Sex: Treating RN: May 12, 1946 (75 y.o. Antonio [name] Primary Care Antonio [name]: Antonio [name] Other Clinician: Donavan [name] Referring Antonio [name]: Treating Antonio [name]/Extender: Antonio [name] in Treatment: 2 HBO Treatment Course Details Treatment Course Number: 1 Ordering Antonio [name]: Antonio [name] Treatments Ordered: otal 40 HBO Treatment Start Date: 08/23/2021 HBO Indication: Late Effect of Radiation HBO Treatment Details Treatment Number: 3 Patient Type: Outpatient Chamber Type: Monoplace Chamber Serial #: [id number] Treatment Protocol: 2.0 ATA with 90 minutes oxygen, and no air breaks Treatment Details Compression Rate Down: 1.5 psi / minute De-Compression Rate Up: 1.5 psi / minute Air breaks and breathing Decompress Decompress Compress Tx Pressure Begins Reached periods Begins Ends (leave unused spaces blank) Chamber Pressure (ATA 1 2 ------2 1 ) Clock Time (24 hr) 09:58 10:08 - - - - - - 11:38 11:48 Treatment Length: 110 (minutes) Treatment Segments: 4 Vital Signs Capillary Blood Glucose Reference Range: 80 - 120 mg / dl HBO Diabetic Blood Glucose Intervention Range: <131 mg/dl or >249 mg/dl Time Vitals Blood Respiratory Capillary Blood Glucose Pulse Action Type: Pulse: Temperature: Taken: Pressure: Rate: Glucose (mg/dl): Meter #: Oximetry (%) Taken: Pre 09:22 129/79 80 16 98.5 180 2 Post 11:54 138/81 69 16 98.2 170 2 Treatment Response Treatment Toleration: Well Treatment Completion Status: Treatment Completed without Adverse Event Physician HBO Attestation: I certify that I supervised this HBO treatment in accordance with Medicare guidelines. [name] trained emergency response team is readily available per  Yes hospital policies and procedures. Continue HBOT as ordered. Yes Electronic Signature(s) Signed: 08/27/2021 4:19:32 PM By: Antonio Shan DO Previous Signature: 08/27/2021 1:33:03 PM Version By: Antonio [name] CHT EMT BS , , Entered By: Antonio [name] on 08/27/2021 16:17:53 -------------------------------------------------------------------------------- HBO Safety Checklist Details Patient Name: Date of Service: Antonio [name]. 08/27/2021 9:00 [name] Medical Record Number: [id number] Patient Account Number: [id number] Date of Birth/Sex: Treating RN: 1945/11/15 (75 y.o. Antonio [name] Primary Care Antonio [name]: Antonio [name] Other Clinician: Donavan [name] Referring Antonio [name]: Treating Antonio [name]/Extender: Antonio [name] in Treatment: 2 HBO Safety Checklist Items Safety Checklist Consent Form Signed Patient voided / foley secured and emptied When did you last eato 0800 Last dose of injectable or oral agent 0800 Ostomy pouch emptied and vented if applicable NA All implantable devices assessed, documented and approved NA Intravenous access site secured and place NA Valuables secured Linens and cotton and cotton/polyester blend (less than 51% polyester) Personal oil-based products / skin lotions / body lotions removed Wigs or hairpieces removed NA Smoking or tobacco materials removed NA Books / newspapers / magazines / loose paper removed Cologne, aftershave, perfume and deodorant removed Jewelry removed (may wrap wedding band) Make-up removed NA Hair care products removed Battery operated devices (external) removed Heating patches and chemical warmers removed Titanium eyewear removed NA Nail polish cured greater than 10 hours NA Casting material cured greater than 10 hours NA Hearing aids removed NA Loose dentures or partials removed dentures removed Prosthetics have been removed NA Patient demonstrates correct use of air  break device (if applicable) Patient concerns have been addressed Patient grounding bracelet on and cord attached to chamber Specifics for Inpatients (complete in addition to above) Medication sheet sent with patient NA Intravenous medications needed or due during therapy sent with patient NA Drainage tubes (e.g. nasogastric tube  or chest tube secured and vented) NA Endotracheal or Tracheotomy tube secured NA Cuff deflated of air and inflated with saline NA Airway suctioned NA Notes Paper version used prior to treatment. Electronic Signature(s) Signed: 08/27/2021 11:17:25 AM By: Antonio [name] CHT EMT BS , , Previous Signature: 08/27/2021 11:14:13 AM Version By: Antonio [name] CHT EMT BS , , Entered By: Antonio [name] on 08/27/2021 11:17:24

## 2021-08-28 ENCOUNTER — Encounter (HOSPITAL_BASED_OUTPATIENT_CLINIC_OR_DEPARTMENT_OTHER): Payer: Medicare Other | Admitting: Internal Medicine

## 2021-08-28 DIAGNOSIS — N3041 Irradiation cystitis with hematuria: Secondary | ICD-10-CM | POA: Diagnosis not present

## 2021-08-28 LAB — GLUCOSE, CAPILLARY
Glucose-Capillary: 171 mg/dL — ABNORMAL HIGH (ref 70–99)
Glucose-Capillary: 149 mg/dL — ABNORMAL HIGH (ref 70–99)

## 2021-08-28 NOTE — Progress Notes (Addendum)
Antonio [name], Antonio [name] ([id number]) Visit Report for 08/28/2021 Arrival Information Details Patient Name: Date of Service: Antonio [name], Antonio [name] [name]. 08/28/2021 9:00 [name] Medical Record Number: [id number] Patient Account Number: [id number] Date of Birth/Sex: Treating RN: 06-May-1946 (75 y.o. [name], [name] Primary Care Arling Cerone: [name] Other Clinician: [name] Referring Laurenashley Viar: Treating Dillan Lunden/Extender: [name] in Treatment: 2 Visit Information History Since Last Visit All ordered tests and consults were completed: Yes Patient Arrived: Ambulatory Added or deleted any medications: No Arrival Time: 09:24 Any new allergies or adverse reactions: No Accompanied By: self Had [name] fall or experienced change in No Transfer Assistance: None activities of daily living that may affect Patient Identification Verified: Yes risk of falls: Secondary Verification Process Completed: Yes Signs or symptoms of abuse/neglect since last visito No Patient Requires Transmission-Based Precautions: No Hospitalized since last visit: No Patient Has Alerts: No Implantable device outside of the clinic excluding No cellular tissue based products placed in the center since last visit: Pain Present Now: No Electronic Signature(s) Signed: 08/28/2021 12:51:14 PM By: [name] CHT EMT BS , , Entered By: [name] on 08/28/2021 12:51:14 -------------------------------------------------------------------------------- Encounter Discharge Information Details Patient Name: Date of Service: Antonio [name]. 08/28/2021 9:00 [name] Medical Record Number: [id number] Patient Account Number: [id number] Date of Birth/Sex: Treating RN: [birth year] (75 y.o. [name] Primary Care Neri Vieyra: [name] Other Clinician: [name] Referring Terryon Pineiro: Treating Fabricio Endsley/Extender: [name] in Treatment: 2 Encounter  Discharge Information Items Discharge Condition: Stable Ambulatory Status: Ambulatory Discharge Destination: Home Transportation: Private Auto Accompanied By: self Schedule Follow-up Appointment: No Clinical Summary of Care: Electronic Signature(s) Signed: 08/28/2021 1:00:21 PM By: [name] CHT EMT BS , , Entered By: [name] on 08/28/2021 13:00:21 -------------------------------------------------------------------------------- Vitals Details Patient Name: Date of Service: Antonio [name]. 08/28/2021 9:00 [name] Medical Record Number: [id number] Patient Account Number: [id number] Date of Birth/Sex: Treating RN: Jun 24, 1946 (75 y.o. [name] Primary Care Vallie Fayette: [name] Other Clinician: [name] Referring Usha Slager: Treating Julianah Marciel/Extender: [name] in Treatment: 2 Vital Signs Time Taken: 09:24 Temperature (F): 98.9 Height (in): 67 Pulse (bpm): 82 Weight (lbs): 170 Respiratory Rate (breaths/min): 14 Body Mass Index (BMI): 26.6 Blood Pressure (mmHg): 113/65 Capillary Blood Glucose (mg/dl): 171 Reference Range: 80 - 120 mg / dl Electronic Signature(s) Signed: 08/28/2021 12:54:52 PM By: [name] CHT EMT BS , , Entered By: [name] on 08/28/2021 12:54:51

## 2021-08-28 NOTE — Progress Notes (Signed)
Antonio [name], Antonio [name] ([id number]) Visit Report for 08/28/2021 SuperBill Details Patient Name: Date of Service: Antonio [name], Antonio [name] [place] 08/28/2021 Medical Record Number: [id number] Patient Account Number: [id number] Date of Birth/Sex: Treating RN: July 01, 1946 (75 y.o. 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

## 2021-08-28 NOTE — Progress Notes (Addendum)
Antonio [name], Antonio [name] ([id number]) Visit Report for 08/28/2021 HBO Details Patient Name: Date of Service: Antonio [name], Antonio [name] [name]. 08/28/2021 9:00 [name] Medical Record Number: [id number] Patient Account Number: [id number] Date of Birth/Sex: Treating RN: May 17, 1946 (75 y.o. Antonio [name] Primary Care Antonio [name]: Antonio [name] Other Clinician: Donavan [name] Referring Antonio [name]: Treating Antonio [name]/Extender: Antonio [name] in Treatment: 2 HBO Treatment Course Details Treatment Course Number: 1 Ordering Antonio [name]: Antonio [name] Treatments Ordered: otal 40 HBO Treatment Start Date: 08/23/2021 HBO Indication: Late Effect of Radiation HBO Treatment Details Treatment Number: 4 Patient Type: Outpatient Chamber Type: Monoplace Chamber Serial #: [id number] Treatment Protocol: 2.0 ATA with 90 minutes oxygen, and no air breaks Treatment Details Compression Rate Down: 1.5 psi / minute De-Compression Rate Up: 1.5 psi / minute Air breaks and breathing Decompress Decompress Compress Tx Pressure Begins Reached periods Begins Ends (leave unused spaces blank) Chamber Pressure (ATA 1 2 ------2 1 ) Clock Time (24 hr) 10:18 10:28 - - - - - - 12:08 12:18 Treatment Length: 120 (minutes) Treatment Segments: 4 Vital Signs Capillary Blood Glucose Reference Range: 80 - 120 mg / dl HBO Diabetic Blood Glucose Intervention Range: <131 mg/dl or >249 mg/dl Time Vitals Blood Respiratory Capillary Blood Glucose Pulse Action Type: Pulse: Temperature: Taken: Pressure: Rate: Glucose (mg/dl): Meter #: Oximetry (%) Taken: Pre 09:24 113/65 82 14 98.9 171 2 Post 12:19 139/78 72 14 98.6 149 2 Treatment Response Treatment Completion Status: Treatment Completed without Adverse Event Antonio [name] Notes No concerns with treatment given Physician HBO Attestation: I certify that I supervised this HBO treatment in accordance with Medicare guidelines. [name] trained emergency response team is  readily available per Yes hospital policies and procedures. Continue HBOT as ordered. Yes Electronic Signature(s) Signed: 08/28/2021 4:37:23 PM By: [name] MD Previous Signature: 08/28/2021 12:58:35 PM Version By: Antonio [name] CHT EMT BS , , Entered By: [name] on 08/28/2021 16:36:01 -------------------------------------------------------------------------------- HBO Safety Checklist Details Patient Name: Date of Service: Antonio [name]. 08/28/2021 9:00 [name] Medical Record Number: [id number] Patient Account Number: [id number] Date of Birth/Sex: Treating RN: 1946-04-14 (75 y.o. Antonio [name] Primary Care Antonio [name]: Antonio [name] Other Clinician: Donavan [name] Referring Antonio [name]: Treating Antonio [name]/Extender: Antonio [name] in Treatment: 2 HBO Safety Checklist Items Safety Checklist Consent Form Signed Patient voided / foley secured and emptied When did you last eato 0800 Last dose of injectable or oral agent 0800 Ostomy pouch emptied and vented if applicable NA All implantable devices assessed, documented and approved NA Intravenous access site secured and place NA Valuables secured Linens and cotton and cotton/polyester blend (less than 51% polyester) Personal oil-based products / skin lotions / body lotions removed Wigs or hairpieces removed NA Smoking or tobacco materials removed NA Books / newspapers / magazines / loose paper removed Cologne, aftershave, perfume and deodorant removed Jewelry removed (may wrap wedding band) NA Make-up removed NA Hair care products removed Battery operated devices (external) removed Heating patches and chemical warmers removed Titanium eyewear removed NA Nail polish cured greater than 10 hours NA Casting material cured greater than 10 hours NA Hearing aids removed NA Loose dentures or partials removed dentures removed Prosthetics have been removed NA Patient  demonstrates correct use of air break device (if applicable) Patient concerns have been addressed Patient grounding bracelet on and cord attached to chamber Specifics for Inpatients (complete in addition to above) Medication sheet sent with patient NA Intravenous medications needed or due during therapy sent with patient NA  Drainage tubes (e.g. nasogastric tube or chest tube secured and vented) NA Endotracheal or Tracheotomy tube secured NA Cuff deflated of air and inflated with saline NA Airway suctioned NA Notes Paper version used prior to treatment. Electronic Signature(s) Signed: 08/28/2021 1:05:27 PM By: Antonio [name] CHT EMT BS , , Previous Signature: 08/28/2021 12:57:15 PM Version By: Antonio [name] CHT EMT BS , , Entered By: Antonio [name] on 08/28/2021 13:05:26

## 2021-08-29 ENCOUNTER — Encounter (HOSPITAL_BASED_OUTPATIENT_CLINIC_OR_DEPARTMENT_OTHER): Payer: Medicare Other | Admitting: Physician Assistant

## 2021-08-29 ENCOUNTER — Other Ambulatory Visit: Payer: Self-pay

## 2021-08-29 DIAGNOSIS — N3041 Irradiation cystitis with hematuria: Secondary | ICD-10-CM | POA: Diagnosis not present

## 2021-08-29 LAB — GLUCOSE, CAPILLARY
Glucose-Capillary: 185 mg/dL — ABNORMAL HIGH (ref 70–99)
Glucose-Capillary: 137 mg/dL — ABNORMAL HIGH (ref 70–99)

## 2021-08-29 NOTE — Progress Notes (Addendum)
ROBIE, MCNIEL ([id number]) Visit Report for 08/29/2021 Arrival Information Details Patient Name: Date of Service: Antonio [name], Antonio [name] [name] 08/29/2021 9:00 A M Medical Record Number: [id number] Patient Account Number: [id number] Date of Birth/Sex: Treating RN: [birth year] (75 y.o. Antonio [name], [name] Primary Care Trusten Hume: [name] Other Clinician: [name] Referring Marylene Masek: Treating Levora Werden/Extender: [name] in Treatment: 2 Visit Information History Since Last Visit All ordered tests and consults were completed: Yes Patient Arrived: Ambulatory Added or deleted any medications: No Arrival Time: 09:19 Any new allergies or adverse reactions: No Accompanied By: self Had a fall or experienced change in No Transfer Assistance: None activities of daily living that may affect Patient Identification Verified: Yes risk of falls: Secondary Verification Process Completed: Yes Signs or symptoms of abuse/neglect since last visito No Patient Requires Transmission-Based Precautions: No Hospitalized since last visit: No Patient Has Alerts: No Implantable device outside of the clinic excluding No cellular tissue based products placed in the center since last visit: Pain Present Now: No Electronic Signature(s) Signed: 08/29/2021 10:51:08 AM By: [name] CHT EMT BS , , Entered By: [name] on 08/29/2021 10:51:07 -------------------------------------------------------------------------------- Encounter Discharge Information Details Patient Name: Date of Service: Antonio [place] MA S A. 08/29/2021 9:00 A M Medical Record Number: [id number] Patient Account Number: [id number] Date of Birth/Sex: Treating RN: 1945/11/20 (75 y.o. Antonio [name] Primary Care Yonas Bunda: [name] Other Clinician: [name] Referring Jori Frerichs: Treating Lavin Petteway/Extender: [name] in Treatment: 2 Encounter  Discharge Information Items Discharge Condition: Stable Ambulatory Status: Ambulatory Discharge Destination: Home Transportation: Private Auto Accompanied By: self Schedule Follow-up Appointment: No Clinical Summary of Care: Electronic Signature(s) Signed: 08/29/2021 12:04:09 PM By: [name] CHT EMT BS , , Entered By: [name] on 08/29/2021 12:04:09 -------------------------------------------------------------------------------- Vitals Details Patient Name: Date of Service: Antonio Baltimore MA S A. 08/29/2021 9:00 A M Medical Record Number: [id number] Patient Account Number: [id number] Date of Birth/Sex: Treating RN: [birth year] (75 y.o. Antonio [name], Antonio [name] Primary Care Lorren Rossetti: [name] Other Clinician: [name] Referring Malika Demario: Treating Nicolette Gieske/Extender: [name] in Treatment: 2 Vital Signs Time Taken: 09:23 Temperature (F): 98.2 Height (in): 67 Pulse (bpm): 82 Weight (lbs): 170 Respiratory Rate (breaths/min): 18 Body Mass Index (BMI): 26.6 Blood Pressure (mmHg): 143/75 Capillary Blood Glucose (mg/dl): 185 Reference Range: 80 - 120 mg / dl Electronic Signature(s) Signed: 08/29/2021 10:51:38 AM By: [name] CHT EMT BS , , Entered By: [name] on 08/29/2021 10:51:38

## 2021-08-29 NOTE — Progress Notes (Signed)
DONAL, LYNAM ([id number]) Visit Report for 08/29/2021 SuperBill Details Patient Name: Date of Service: Antonio [name], Antonio [name] [place] 08/29/2021 Medical Record Number: [id number] Patient Account Number: [id number] Date of Birth/Sex: Treating RN: 04-01-1946 (75 y.o. 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

## 2021-08-29 NOTE — Progress Notes (Addendum)
JAILON, SCHAIBLE ([id number]) Visit Report for 08/29/2021 HBO Details Patient Name: Date of Service: ROLLINS, [place]IGHTSON [name] 08/29/2021 9:00 A M Medical Record Number: [id number] Patient Account Number: [id number] Date of Birth/Sex: Treating RN: 12/15/1945 (75 y.o. [name], [name] Primary Care Emauri Krygier: [name] Other Clinician: [name] Referring Toi Stelly: Treating Deeanne Deininger/Extender: [name] in Treatment: 2 HBO Treatment Course Details Treatment Course Number: 1 Ordering Bret Vanessen: Bernerd Pho Treatments Ordered: otal 40 HBO Treatment Start Date: 08/23/2021 HBO Indication: Late Effect of Radiation HBO Treatment Details Treatment Number: 5 Patient Type: Outpatient Chamber Type: Monoplace Chamber Serial #: [id number] Treatment Protocol: 2.0 ATA with 90 minutes oxygen, and no air breaks Treatment Details Compression Rate Down: 2.0 psi / minute De-Compression Rate Up: 2.0 psi / minute Air breaks and breathing Decompress Decompress Compress Tx Pressure Begins Reached periods Begins Ends (leave unused spaces blank) Chamber Pressure (ATA 1 2 ------2 1 ) Clock Time (24 hr) 09:43 09:51 - - - - - - 11:21 11:29 Treatment Length: 106 (minutes) Treatment Segments: 4 Vital Signs Capillary Blood Glucose Reference Range: 80 - 120 mg / dl HBO Diabetic Blood Glucose Intervention Range: <131 mg/dl or >249 mg/dl Time Vitals Blood Respiratory Capillary Blood Glucose Pulse Action Type: Pulse: Temperature: Taken: Pressure: Rate: Glucose (mg/dl): Meter #: Oximetry (%) Taken: Pre 09:23 143/75 82 18 98.2 185 Post 11:48 154/79 68 16 98.4 137 Treatment Response Treatment Toleration: Well Treatment Completion Status: Treatment Completed without Adverse Event Electronic Signature(s) Signed: 08/29/2021 12:03:07 PM By: [name] CHT EMT BS , , Signed: 08/29/2021 4:59:49 PM By: [name] Entered By: [name] on  08/29/2021 12:03:07 -------------------------------------------------------------------------------- HBO Safety Checklist Details Patient Name: Date of Service: [name] 08/29/2021 9:00 A M Medical Record Number: [id number] Patient Account Number: [id number] Date of Birth/Sex: Treating RN: 1946/07/03 (75 y.o. [name], [name] Primary Care Annayah Worthley: [name] Other Clinician: [name] Referring Maripat Borba: Treating Elektra Wartman/Extender: [name] in Treatment: 2 HBO Safety Checklist Items Safety Checklist Consent Form Signed Patient voided / foley secured and emptied When did you last eato 0800 Last dose of injectable or oral agent 0800 Ostomy pouch emptied and vented if applicable NA All implantable devices assessed, documented and approved NA Intravenous access site secured and place NA Valuables secured Linens and cotton and cotton/polyester blend (less than 51% polyester) Personal oil-based products / skin lotions / body lotions removed Wigs or hairpieces removed NA Smoking or tobacco materials removed NA Books / newspapers / magazines / loose paper removed Cologne, aftershave, perfume and deodorant removed Jewelry removed (may wrap wedding band) Make-up removed NA Hair care products removed Battery operated devices (external) removed Heating patches and chemical warmers removed Titanium eyewear removed NA Nail polish cured greater than 10 hours NA Casting material cured greater than 10 hours NA Hearing aids removed NA Loose dentures or partials removed dentures removed Prosthetics have been removed NA Patient demonstrates correct use of air break device (if applicable) Patient concerns have been addressed Patient grounding bracelet on and cord attached to chamber Specifics for Inpatients (complete in addition to above) Medication sheet sent with patient NA Intravenous medications needed or due during  therapy sent with patient NA Drainage tubes (e.g. nasogastric tube or chest tube secured and vented) NA Endotracheal or Tracheotomy tube secured NA Cuff deflated of air and inflated with saline NA Airway suctioned NA Electronic Signature(s) Signed: 08/29/2021 10:53:38 AM By: [name] CHT EMT  BS , , Entered By: [name] on 08/29/2021 10:53:37

## 2021-08-30 ENCOUNTER — Encounter (HOSPITAL_BASED_OUTPATIENT_CLINIC_OR_DEPARTMENT_OTHER): Payer: Medicare Other | Admitting: Internal Medicine

## 2021-08-30 DIAGNOSIS — N3041 Irradiation cystitis with hematuria: Secondary | ICD-10-CM | POA: Diagnosis not present

## 2021-08-30 LAB — GLUCOSE, CAPILLARY
Glucose-Capillary: 154 mg/dL — ABNORMAL HIGH (ref 70–99)
Glucose-Capillary: 144 mg/dL — ABNORMAL HIGH (ref 70–99)

## 2021-08-30 NOTE — Progress Notes (Addendum)
ARIO, MCDIARMID ([id number]) Visit Report for 08/30/2021 Arrival Information Details Patient Name: Date of Service: Antonio [name], Antonio [name] [name]. 08/30/2021 9:00 [name] Medical Record Number: [id number] Patient Account Number: [id number] Date of Birth/Sex: Treating RN: Aug 12, 1946 (75 y.o. Antonio [name], [name] Primary Care Antonio [name]: [name] Antonio [name]: [name] Referring Kytzia Gienger: Treating Shemiah Rosch/Extender: [name] in Treatment: 3 Visit Information History Since Last Visit All ordered tests and consults were completed: Yes Patient Arrived: Ambulatory Added or deleted any medications: No Arrival Time: 09:20 Any new allergies or adverse reactions: No Accompanied By: self Had [name] fall or experienced change in No Transfer Assistance: None activities of daily living that may affect Patient Identification Verified: Yes risk of falls: Secondary Verification Process Completed: Yes Signs or symptoms of abuse/neglect since last visito No Patient Requires Transmission-Based Precautions: No Hospitalized since last visit: No Patient Has Alerts: No Implantable device outside of the clinic excluding No cellular tissue based products placed in the center since last visit: Pain Present Now: No Electronic Signature(s) Signed: 09/03/2021 10:05:09 AM By: [name] CHT EMT BS , , Previous Signature: 08/30/2021 9:50:16 AM Version By: [name] CHT EMT BS , , Entered By: [name] on 08/30/2021 10:32:56 -------------------------------------------------------------------------------- Encounter Discharge Information Details Patient Name: Date of Service: Antonio [name]. 08/30/2021 9:00 [name] Medical Record Number: [id number] Patient Account Number: [id number] Date of Birth/Sex: Treating RN: January 12, 1946 (75 y.o. Antonio [name] Primary Care Antonio [name]: [name] Antonio [name]: [name] Referring Zo Loudon: Treating  Ayse Mccartin/Extender: [name] in Treatment: 3 Encounter Discharge Information Items Discharge Condition: Stable Ambulatory Status: Ambulatory Discharge Destination: Home Transportation: Private Auto Accompanied By: self Schedule Follow-up Appointment: No Clinical Summary of Care: Electronic Signature(s) Signed: 08/30/2021 11:55:59 AM By: [name] CHT EMT BS , , Entered By: [name] on 08/30/2021 11:55:59 -------------------------------------------------------------------------------- Vitals Details Patient Name: Date of Service: Antonio [name]. 08/30/2021 9:00 [name] Medical Record Number: [id number] Patient Account Number: [id number] Date of Birth/Sex: Treating RN: [birth year] (75 y.o. Antonio [name], Antonio [name] Primary Care Antonio [name]: [name] Antonio [name]: [name] Referring Isam Unrein: Treating Collins Dimaria/Extender: [name] in Treatment: 3 Vital Signs Time Taken: 09:23 Temperature (F): 98.9 Height (in): 67 Pulse (bpm): 82 Weight (lbs): 170 Respiratory Rate (breaths/min): 18 Body Mass Index (BMI): 26.6 Blood Pressure (mmHg): 128/84 Capillary Blood Glucose (mg/dl): 154 Reference Range: 80 - 120 mg / dl Airway Pulse Oximetry (%): 100 Electronic Signature(s) Signed: 09/03/2021 10:05:09 AM By: [name] CHT EMT BS , , Previous Signature: 08/30/2021 9:51:57 AM Version By: [name] CHT EMT BS , , Entered By: [name] on 08/30/2021 10:33:08

## 2021-08-30 NOTE — Progress Notes (Addendum)
Antonio [name], Antonio [name] ([id number]) Visit Report for 08/30/2021 HBO Details Patient Name: Date of Service: Antonio [name], Antonio [name] [name] 08/30/2021 9:00 A M Medical Record Number: [id number] Patient Account Number: [id number] Date of Birth/Sex: Treating RN: [birth year] (75 y.o. [name], [name] Primary Care Anne Boltz: [name] Other Clinician: [name] Referring Tonjia Parillo: Treating Damon Hargrove/Extender: [name] in Treatment: 3 HBO Treatment Course Details Treatment Course Number: 1 Ordering Danely Bayliss: Bernerd Pho Treatments Ordered: otal 40 HBO Treatment Start Date: 08/23/2021 HBO Indication: Late Effect of Radiation HBO Treatment Details Treatment Number: 6 Patient Type: Outpatient Chamber Type: Monoplace Chamber Serial #: [id number] Treatment Protocol: 2.0 ATA with 90 minutes oxygen, and no air breaks Treatment Details Compression Rate Down: 2.0 psi / minute De-Compression Rate Up: 2.0 psi / minute Air breaks and breathing Decompress Decompress Compress Tx Pressure Begins Reached periods Begins Ends (leave unused spaces blank) Chamber Pressure (ATA 1 2 ------2 1 ) Clock Time (24 hr) 09:38 09:46 - - - - - - 11:16 11:24 Treatment Length: 106 (minutes) Treatment Segments: 4 Vital Signs Capillary Blood Glucose Reference Range: 80 - 120 mg / dl HBO Diabetic Blood Glucose Intervention Range: <131 mg/dl or >249 mg/dl Time Vitals Blood Respiratory Capillary Blood Glucose Pulse Action Type: Pulse: Temperature: Taken: Pressure: Rate: Glucose (mg/dl): Meter #: Oximetry (%) Taken: Pre 09:23 128/84 82 18 98.9 154 2 100 Post 11:28 128/84 76 18 98.7 144 2 100 Treatment Response Treatment Toleration: Well Treatment Completion Status: Treatment Completed without Adverse Event Taison Celani Notes No concerns with treatment given Physician HBO Attestation: I certify that I supervised this HBO treatment in accordance with Medicare guidelines. A  trained emergency response team is readily available per Yes hospital policies and procedures. Continue HBOT as ordered. Yes Electronic Signature(s) Signed: 08/30/2021 5:16:34 PM By: [name] MD Previous Signature: 08/30/2021 11:53:44 AM Version By: [name] CHT EMT BS , , Entered By: [name] on 08/30/2021 17:14:06 -------------------------------------------------------------------------------- HBO Safety Checklist Details Patient Name: Date of Service: Antonio Baltimore MA S A. 08/30/2021 9:00 A M Medical Record Number: [id number] Patient Account Number: [id number] Date of Birth/Sex: Treating RN: 12/05/1945 (75 y.o. [name], [name] Primary Care Tamme Mozingo: [name] Other Clinician: [name] Referring Akio Hudnall: Treating Pacer Dorn/Extender: [name] in Treatment: 3 HBO Safety Checklist Items Safety Checklist Consent Form Signed Patient voided / foley secured and emptied When did you last eato 0630 Last dose of injectable or oral agent 0630 Ostomy pouch emptied and vented if applicable NA All implantable devices assessed, documented and approved NA Intravenous access site secured and place NA Valuables secured Linens and cotton and cotton/polyester blend (less than 51% polyester) Personal oil-based products / skin lotions / body lotions removed Wigs or hairpieces removed NA Smoking or tobacco materials removed NA Books / newspapers / magazines / loose paper removed Cologne, aftershave, perfume and deodorant removed Jewelry removed (may wrap wedding band) Make-up removed NA Hair care products removed Battery operated devices (external) removed Heating patches and chemical warmers removed Titanium eyewear removed NA Nail polish cured greater than 10 hours NA Casting material cured greater than 10 hours NA Hearing aids removed NA Loose dentures or partials removed dentures removed Prosthetics have been  removed NA Patient demonstrates correct use of air break device (if applicable) Patient concerns have been addressed Patient grounding bracelet on and cord attached to chamber Specifics for Inpatients (complete in addition to above) Medication sheet sent with patient NA Intravenous medications needed or due during therapy  sent with patient NA Drainage tubes (e.g. nasogastric tube or chest tube secured and vented) NA Endotracheal or Tracheotomy tube secured NA Cuff deflated of air and inflated with saline NA Airway suctioned NA Notes Paper version used prior to treatment. Electronic Signature(s) Signed: 08/30/2021 11:50:52 AM By: [name] CHT EMT BS , , Previous Signature: 08/30/2021 10:34:27 AM Version By: [name] CHT EMT BS , , Entered By: [name] on 08/30/2021 11:50:52

## 2021-08-30 NOTE — Progress Notes (Signed)
Antonio [name], Antonio [name] ([id number]) Visit Report for 08/30/2021 SuperBill Details Patient Name: Date of Service: Antonio [name], Antonio [name] [place] 08/30/2021 Medical Record Number: [id number] Patient Account Number: [id number] Date of Birth/Sex: Treating RN: Oct 06, 1946 (75 y.o. 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

## 2021-08-31 ENCOUNTER — Encounter (HOSPITAL_BASED_OUTPATIENT_CLINIC_OR_DEPARTMENT_OTHER): Payer: Medicare Other | Admitting: Internal Medicine

## 2021-08-31 ENCOUNTER — Other Ambulatory Visit: Payer: Self-pay

## 2021-08-31 DIAGNOSIS — N3041 Irradiation cystitis with hematuria: Secondary | ICD-10-CM | POA: Diagnosis not present

## 2021-08-31 DIAGNOSIS — Z8546 Personal history of malignant neoplasm of prostate: Secondary | ICD-10-CM

## 2021-08-31 DIAGNOSIS — R350 Frequency of micturition: Secondary | ICD-10-CM | POA: Diagnosis not present

## 2021-08-31 LAB — GLUCOSE, CAPILLARY
Glucose-Capillary: 125 mg/dL — ABNORMAL HIGH (ref 70–99)
Glucose-Capillary: 160 mg/dL — ABNORMAL HIGH (ref 70–99)
Glucose-Capillary: 148 mg/dL — ABNORMAL HIGH (ref 70–99)

## 2021-08-31 NOTE — Progress Notes (Addendum)
Antonio [name], Antonio [name] ([id number]) Visit Report for 08/31/2021 HBO Details Patient Name: Date of Service: Antonio, [name] [name] 08/31/2021 9:00 A M Medical Record Number: [id number] Patient Account Number: [id number] Date of Birth/Sex: Treating RN: 23-Aug-1946 (75 y.o. Antonio [name], [name] Primary Care Waqas Bruhl: [name] Other Clinician: [name] Referring Shellye Zandi: Treating Myles Tavella/Extender: Krista Blue in Treatment: 3 HBO Treatment Course Details Treatment Course Number: 1 Ordering Ander Wamser: Bernerd Pho Treatments Ordered: otal 40 HBO Treatment Start Date: 08/23/2021 HBO Indication: Late Effect of Radiation HBO Treatment Details Treatment Number: 7 Patient Type: Outpatient Chamber Type: Monoplace Chamber Serial #: [id number] Treatment Protocol: 2.0 ATA with 90 minutes oxygen, and no air breaks Treatment Details Compression Rate Down: 2.0 psi / minute De-Compression Rate Up: 2.0 psi / minute Air breaks and breathing Decompress Decompress Compress Tx Pressure Begins Reached periods Begins Ends (leave unused spaces blank) Chamber Pressure (ATA 1 2 ------2 1 ) Clock Time (24 hr) 09:48 09:56 - - - - - - 11:26 11:34 Treatment Length: 106 (minutes) Treatment Segments: 4 Vital Signs Capillary Blood Glucose Reference Range: 80 - 120 mg / dl HBO Diabetic Blood Glucose Intervention Range: <131 mg/dl or >249 mg/dl Type: Time Vitals Blood Pulse: Respiratory Capillary Blood Glucose Pulse Action Temperature: Taken: Pressure: Rate: Glucose (mg/dl): Meter #: Oximetry (%) Taken: Pre 09:14 118/69 83 18 99.3 125 2 Given 8 oz. Glucerna Post 11:38 137/78 73 18 98.5 148 Pre 09:42 160 Cleared for HBO Treatment Treatment Response Treatment Toleration: Well Treatment Completion Status: Treatment Completed without Adverse Event Electronic Signature(s) Signed: 08/31/2021 11:45:31 AM By: [name] CHT EMT BS , , Signed: 08/31/2021  12:12:33 PM By: Antonio Shan DO Entered By: [name] on 08/31/2021 11:45:30 -------------------------------------------------------------------------------- HBO Safety Checklist Details Patient Name: Date of Service: [name] 08/31/2021 9:00 A M Medical Record Number: [id number] Patient Account Number: [id number] Date of Birth/Sex: Treating RN: Oct 18, 1945 (75 y.o. Antonio [name], [name] Primary Care Colyn Miron: [name] Other Clinician: [name] Referring Artesha Wemhoff: Treating [name]/Extender: Krista Blue in Treatment: 3 HBO Safety Checklist Items Safety Checklist Consent Form Signed Patient voided / foley secured and emptied When did you last eato 0735 Last dose of injectable or oral agent 0735 Ostomy pouch emptied and vented if applicable NA All implantable devices assessed, documented and approved NA Intravenous access site secured and place NA Valuables secured Linens and cotton and cotton/polyester blend (less than 51% polyester) Personal oil-based products / skin lotions / body lotions removed Wigs or hairpieces removed NA Smoking or tobacco materials removed NA Books / newspapers / magazines / loose paper removed Cologne, aftershave, perfume and deodorant removed Jewelry removed (may wrap wedding band) Make-up removed NA Hair care products removed Battery operated devices (external) removed Heating patches and chemical warmers removed Titanium eyewear removed NA Nail polish cured greater than 10 hours NA Casting material cured greater than 10 hours NA Hearing aids removed NA Loose dentures or partials removed dentures removed Prosthetics have been removed NA Patient demonstrates correct use of air break device (if applicable) Patient concerns have been addressed Patient grounding bracelet on and cord attached to chamber Specifics for Inpatients (complete in addition to above) Medication sheet  sent with patient NA Intravenous medications needed or due during therapy sent with patient NA Drainage tubes (e.g. nasogastric tube or chest tube secured and vented) NA Endotracheal or Tracheotomy tube secured NA Cuff deflated of air and inflated with saline NA Airway suctioned NA Notes Paper  version used prior to treatment. Electronic Signature(s) Signed: 08/31/2021 10:57:50 AM By: [name] CHT EMT BS , , Entered By: [name] on 08/31/2021 10:57:49

## 2021-08-31 NOTE — Progress Notes (Signed)
GORDIE, CRUMBY ([id number]) Visit Report for 08/31/2021 SuperBill Details Patient Name: Date of Service: DAMASCUS, FELDPAUSCH [place] 08/31/2021 Medical Record Number: [id number] Patient Account Number: [id number] Date of Birth/Sex: Treating RN: 1946/01/04 (75 y.o. 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

## 2021-09-03 ENCOUNTER — Other Ambulatory Visit: Payer: Self-pay

## 2021-09-03 ENCOUNTER — Encounter (HOSPITAL_BASED_OUTPATIENT_CLINIC_OR_DEPARTMENT_OTHER): Payer: Medicare Other | Admitting: Internal Medicine

## 2021-09-03 DIAGNOSIS — N3041 Irradiation cystitis with hematuria: Secondary | ICD-10-CM | POA: Diagnosis not present

## 2021-09-03 DIAGNOSIS — R350 Frequency of micturition: Secondary | ICD-10-CM | POA: Diagnosis not present

## 2021-09-03 DIAGNOSIS — Z8546 Personal history of malignant neoplasm of prostate: Secondary | ICD-10-CM

## 2021-09-03 LAB — GLUCOSE, CAPILLARY
Glucose-Capillary: 165 mg/dL — ABNORMAL HIGH (ref 70–99)
Glucose-Capillary: 137 mg/dL — ABNORMAL HIGH (ref 70–99)

## 2021-09-03 NOTE — Progress Notes (Addendum)
TERRYN, REDNER ([id number]) Visit Report for 09/03/2021 Arrival Information Details Patient Name: Date of Service: DARSH, VANDEVOORT [name] 09/03/2021 9:00 A M Medical Record Number: [id number] Patient Account Number: [id number] Date of Birth/Sex: Treating RN: 1946-09-15 (75 y.o. [name], [name] Primary Care Rowyn Mustapha: [name] Other Clinician: [name] Referring Karly Pitter: Treating Johaan Ryser/Extender: Krista Blue in Treatment: 3 Visit Information History Since Last Visit All ordered tests and consults were completed: Yes Patient Arrived: Ambulatory Added or deleted any medications: No Arrival Time: 09:16 Any new allergies or adverse reactions: No Accompanied By: self Had a fall or experienced change in No Transfer Assistance: None activities of daily living that may affect Patient Identification Verified: Yes risk of falls: Secondary Verification Process Completed: Yes Signs or symptoms of abuse/neglect since last visito No Patient Requires Transmission-Based Precautions: No Hospitalized since last visit: No Patient Has Alerts: No Implantable device outside of the clinic excluding No cellular tissue based products placed in the center since last visit: Pain Present Now: No Electronic Signature(s) Signed: 09/03/2021 10:02:14 AM By: [name] CHT EMT BS , , Entered By: [name] on 09/03/2021 10:02:14 -------------------------------------------------------------------------------- Encounter Discharge Information Details Patient Name: Date of Service: [name] 09/03/2021 9:00 A M Medical Record Number: [id number] Patient Account Number: [id number] Date of Birth/Sex: Treating RN: 1946/10/09 (75 y.o. [name] Primary Care Keilon Ressel: [name] Other Clinician: [name] Referring Shadrach Bartunek: Treating Keeley Sussman/Extender: Krista Blue in Treatment: 3 Encounter  Discharge Information Items Discharge Condition: Stable Ambulatory Status: Ambulatory Discharge Destination: Home Transportation: Private Auto Accompanied By: self Schedule Follow-up Appointment: No Clinical Summary of Care: Electronic Signature(s) Signed: 09/03/2021 12:04:55 PM By: [name] CHT EMT BS , , Entered By: [name] on 09/03/2021 12:04:55 -------------------------------------------------------------------------------- Vitals Details Patient Name: Date of Service: [name] 09/03/2021 9:00 A M Medical Record Number: [id number] Patient Account Number: [id number] Date of Birth/Sex: Treating RN: July 03, 1946 (75 y.o. [name], [name] Primary Care Shayana Hornstein: [name] Other Clinician: [name] Referring Ronique Simerly: Treating [name]/Extender: Krista Blue in Treatment: 3 Vital Signs Time Taken: 09:20 Temperature (F): 98.8 Height (in): 67 Pulse (bpm): 79 Weight (lbs): 170 Respiratory Rate (breaths/min): 18 Body Mass Index (BMI): 26.6 Blood Pressure (mmHg): 135/75 Capillary Blood Glucose (mg/dl): 165 Reference Range: 80 - 120 mg / dl Electronic Signature(s) Signed: 09/03/2021 10:04:41 AM By: [name] CHT EMT BS , , Entered By: [name] on 09/03/2021 10:04:40

## 2021-09-03 NOTE — Progress Notes (Signed)
CLIMMIE, BUELOW ([id number]) Visit Report for 09/03/2021 SuperBill Details Patient Name: Date of Service: MOOSA, BUECHE [place] 09/03/2021 Medical Record Number: [id number] Patient Account Number: [id number] Date of Birth/Sex: Treating RN: [birth year] (75 y.o. 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

## 2021-09-03 NOTE — Progress Notes (Addendum)
PURL, CLAYTOR ([id number]) Visit Report for 09/03/2021 HBO Details Patient Name: Date of Service: KELSO, BIBBY [name] 09/03/2021 9:00 A M Medical Record Number: [id number] Patient Account Number: [id number] Date of Birth/Sex: Treating RN: Apr 13, 1946 (75 y.o. [name], [name] Primary Care Timisha Mondry: [name] Other Clinician: [name] Referring Esiquio Boesen: Treating Caydn Justen/Extender: Krista Blue in Treatment: 3 HBO Treatment Course Details Treatment Course Number: 1 Ordering Ivie Savitt: Bernerd Pho Treatments Ordered: otal 40 HBO Treatment Start Date: 08/23/2021 HBO Indication: Late Effect of Radiation HBO Treatment Details Treatment Number: 8 Patient Type: Outpatient Chamber Type: Monoplace Chamber Serial #: [id number] Treatment Protocol: 2.0 ATA with 90 minutes oxygen, and no air breaks Treatment Details Compression Rate Down: 2.0 psi / minute De-Compression Rate Up: 2.0 psi / minute Air breaks and breathing Decompress Decompress Compress Tx Pressure Begins Reached periods Begins Ends (leave unused spaces blank) Chamber Pressure (ATA 1 2 ------2 1 ) Clock Time (24 hr) 09:36 09:45 - - - - - - 11:15 11:34 Treatment Length: 118 (minutes) Treatment Segments: 4 Vital Signs Capillary Blood Glucose Reference Range: 80 - 120 mg / dl HBO Diabetic Blood Glucose Intervention Range: <131 mg/dl or >249 mg/dl Time Vitals Blood Respiratory Capillary Blood Glucose Pulse Action Type: Pulse: Temperature: Taken: Pressure: Rate: Glucose (mg/dl): Meter #: Oximetry (%) Taken: Pre 09:20 135/75 79 18 98.8 165 Post 11:25 141/84 67 18 98.4 137 Treatment Response Treatment Toleration: Well Treatment Completion Status: Treatment Completed without Adverse Event Treatment Notes Patient reports slight chest pain when he reaches vehicle. Patient states that after he gets in his vehicle and relaxes it goes away. Alerted physician. Patient to  follow up with primary care Reginald Weida or cardiologist. Physician HBO Attestation: I certify that I supervised this HBO treatment in accordance with Medicare guidelines. A trained emergency response team is readily available per Yes hospital policies and procedures. Continue HBOT as ordered. Yes Electronic Signature(s) Signed: 09/03/2021 12:31:58 PM By: Kalman Shan DO Previous Signature: 09/03/2021 12:03:55 PM Version By: [name] CHT EMT BS , , Entered By: Kalman Shan on 09/03/2021 12:31:20 -------------------------------------------------------------------------------- HBO Safety Checklist Details Patient Name: Date of Service: [name] 09/03/2021 9:00 A M Medical Record Number: [id number] Patient Account Number: [id number] Date of Birth/Sex: Treating RN: 1946/04/04 (75 y.o. [name], [name] Primary Care Marynell Bies: [name] Other Clinician: [name] Referring Rawlin Reaume: Treating Windy Dudek/Extender: Krista Blue in Treatment: 3 HBO Safety Checklist Items Safety Checklist Consent Form Signed Patient voided / foley secured and emptied When did you last eato 0810 Last dose of injectable or oral agent 0745 Ostomy pouch emptied and vented if applicable NA All implantable devices assessed, documented and approved NA Intravenous access site secured and place NA Valuables secured Linens and cotton and cotton/polyester blend (less than 51% polyester) Personal oil-based products / skin lotions / body lotions removed Wigs or hairpieces removed NA Smoking or tobacco materials removed NA Books / newspapers / magazines / loose paper removed Cologne, aftershave, perfume and deodorant removed Jewelry removed (may wrap wedding band) Make-up removed NA Hair care products removed Battery operated devices (external) removed Heating patches and chemical warmers removed Titanium eyewear removed NA Nail polish cured  greater than 10 hours NA Casting material cured greater than 10 hours NA Hearing aids removed NA Loose dentures or partials removed dentures removed Prosthetics have been removed NA Patient demonstrates correct use of air break device (if applicable) Patient concerns have been addressed Patient grounding bracelet on  and cord attached to chamber Specifics for Inpatients (complete in addition to above) Medication sheet sent with patient NA Intravenous medications needed or due during therapy sent with patient NA Drainage tubes (e.g. nasogastric tube or chest tube secured and vented) NA Endotracheal or Tracheotomy tube secured NA Cuff deflated of air and inflated with saline NA Airway suctioned NA Notes Paper version used prior to treatment. Electronic Signature(s) Signed: 09/03/2021 11:13:30 AM By: [name] CHT EMT BS , , Entered By: [name] on 09/03/2021 11:13:28

## 2021-09-03 NOTE — Progress Notes (Addendum)
[name], Antonio [name] ([id number]) Visit Report for 08/31/2021 Arrival Information Details Patient Name: Date of Service: Antonio, Antonio [name] 08/31/2021 9:00 A M Medical Record Number: [id number] Patient Account Number: [id number] Date of Birth/Sex: Treating RN: 1946-06-04 (75 y.o. [name], [name] Primary Care Alnita Aybar: [name] Other Clinician: Donavan [name] Referring Gregory Dowe: Treating Soriya Worster/Extender: Krista Blue in Treatment: 3 Visit Information History Since Last Visit All ordered tests and consults were completed: Yes Patient Arrived: Ambulatory Added or deleted any medications: No Arrival Time: 09:11 Any new allergies or adverse reactions: No Accompanied By: self Had a fall or experienced change in No Transfer Assistance: None activities of daily living that may affect Patient Identification Verified: Yes risk of falls: Secondary Verification Process Completed: Yes Signs or symptoms of abuse/neglect since last visito No Patient Requires Transmission-Based Precautions: No Hospitalized since last visit: No Patient Has Alerts: No Implantable device outside of the clinic excluding No cellular tissue based products placed in the center since last visit: Pain Present Now: No Electronic Signature(s) Signed: 08/31/2021 10:53:49 AM By: Antonio [name] CHT EMT BS , , Entered By: Antonio [name] on 08/31/2021 10:53:49 -------------------------------------------------------------------------------- Encounter Discharge Information Details Patient Name: Date of Service: Antonio [place] MA S A. 08/31/2021 9:00 A M Medical Record Number: [id number] Patient Account Number: [id number] Date of Birth/Sex: Treating RN: 1945/10/23 (75 y.o. [name] Primary Care Felita Bump: [name] Other Clinician: Donavan [name] Referring Aloha Bartok: Treating Morrison Mcbryar/Extender: Krista Blue in Treatment: 3 Encounter  Discharge Information Items Discharge Condition: Stable Ambulatory Status: Ambulatory Discharge Destination: Home Transportation: Private Auto Accompanied By: self Schedule Follow-up Appointment: No Clinical Summary of Care: Electronic Signature(s) Signed: 08/31/2021 11:46:42 AM By: Antonio [name] CHT EMT BS , , Entered By: Antonio [name] on 08/31/2021 11:46:42 -------------------------------------------------------------------------------- Vitals Details Patient Name: Date of Service: Antonio [place] MA S A. 08/31/2021 9:00 A M Medical Record Number: [id number] Patient Account Number: [id number] Date of Birth/Sex: Treating RN: 24-Sep-1946 (75 y.o. [name], [name] Primary Care Trustin Chapa: [name] Other Clinician: Donavan [name] Referring Victoriana Aziz: Treating Gabrielle Mester/Extender: Krista Blue in Treatment: 3 Vital Signs Time Taken: 09:44 Temperature (F): 99.3 Height (in): 67 Pulse (bpm): 83 Weight (lbs): 170 Respiratory Rate (breaths/min): 18 Body Mass Index (BMI): 26.6 Blood Pressure (mmHg): 118/69 Capillary Blood Glucose (mg/dl): 125 Reference Range: 80 - 120 mg / dl Electronic Signature(s) Signed: 08/31/2021 10:56:37 AM By: Antonio [name] CHT EMT BS , , Entered By: Antonio [name] on 08/31/2021 10:56:36

## 2021-09-04 ENCOUNTER — Telehealth: Payer: Self-pay | Admitting: Cardiology

## 2021-09-04 ENCOUNTER — Encounter (HOSPITAL_BASED_OUTPATIENT_CLINIC_OR_DEPARTMENT_OTHER): Payer: Medicare Other | Admitting: Internal Medicine

## 2021-09-04 DIAGNOSIS — N3041 Irradiation cystitis with hematuria: Secondary | ICD-10-CM | POA: Diagnosis not present

## 2021-09-04 DIAGNOSIS — Z8546 Personal history of malignant neoplasm of prostate: Secondary | ICD-10-CM

## 2021-09-04 DIAGNOSIS — R350 Frequency of micturition: Secondary | ICD-10-CM

## 2021-09-04 LAB — GLUCOSE, CAPILLARY
Glucose-Capillary: 219 mg/dL — ABNORMAL HIGH (ref 70–99)
Glucose-Capillary: 185 mg/dL — ABNORMAL HIGH (ref 70–99)

## 2021-09-04 NOTE — Progress Notes (Signed)
Antonio [name], Antonio [name] ([id number]) Visit Report for 09/04/2021 SuperBill Details Patient Name: Date of Service: AMRON, GUERRETTE [place] 09/04/2021 Medical Record Number: [id number] Patient Account Number: [id number] Date of Birth/Sex: Treating RN: Sep 13, 1946 (75 y.o. 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

## 2021-09-04 NOTE — Telephone Encounter (Signed)
Please see previous encounter

## 2021-09-04 NOTE — Progress Notes (Addendum)
Antonio Antonio, Antonio Antonio ([id number]) Visit Report for 09/04/2021 Arrival Information Details Patient Name: Date of Service: Antonio Antonio, Antonio Antonio [place] Antonio. 09/04/2021 9:00 Antonio Antonio Medical Record Number: [id number] Patient Account Number: [id number] Date of Birth/Sex: Treating RN: 05/29/1946 (75 y.o. Antonio Antonio Primary Care Carie Kapuscinski: [name] Other Clinician: [name] Referring Tonianne Fine: Treating Dewanda Fennema/Extender: Krista Blue in Treatment: 3 Visit Information History Since Last Visit All ordered tests and consults were completed: Yes Patient Arrived: Ambulatory Added or deleted any medications: No Arrival Time: 09:42 Any new allergies or adverse reactions: No Accompanied By: self Had Antonio fall or experienced change in No Transfer Assistance: None activities of daily living that may affect Patient Identification Verified: Yes risk of falls: Secondary Verification Process Completed: Yes Signs or symptoms of abuse/neglect since last visito No Patient Requires Transmission-Based Precautions: No Hospitalized since last visit: No Patient Has Alerts: No Implantable device outside of the clinic excluding No cellular tissue based products placed in the center since last visit: Pain Present Now: No Electronic Signature(s) Signed: 09/04/2021 9:49:24 AM By: [name] CHT EMT BS , , Entered By: [name] on 09/04/2021 09:49:24 -------------------------------------------------------------------------------- Encounter Discharge Information Details Patient Name: Date of Service: Antonio Antonio. 09/04/2021 9:00 Antonio Antonio Medical Record Number: [id number] Patient Account Number: [id number] Date of Birth/Sex: Treating RN: 05-14-1946 (75 y.o. Antonio Antonio Primary Care Eiley Mcginnity: [name] Other Clinician: [name] Referring Matin Mattioli: Treating Maddux Vanscyoc/Extender: Krista Blue in Treatment: 3 Encounter  Discharge Information Items Discharge Condition: Stable Ambulatory Status: Ambulatory Discharge Destination: Home Transportation: Private Auto Accompanied By: self Schedule Follow-up Appointment: No Clinical Summary of Care: Electronic Signature(s) Signed: 09/04/2021 12:06:37 PM By: [name] CHT EMT BS , , Entered By: [name] on 09/04/2021 12:06:37 -------------------------------------------------------------------------------- Vitals Details Patient Name: Date of Service: Antonio Antonio. 09/04/2021 9:00 Antonio Antonio Medical Record Number: [id number] Patient Account Number: [id number] Date of Birth/Sex: Treating RN: 10/22/1945 (75 y.o. Antonio Antonio Primary Care Lonza Shimabukuro: [name] Other Clinician: [name] Referring Ynez Eugenio: Treating Jeffren Dombek/Extender: Krista Blue in Treatment: 3 Vital Signs Time Taken: 09:46 Temperature (F): 98.3 Height (in): 67 Pulse (bpm): 78 Weight (lbs): 170 Respiratory Rate (breaths/min): 16 Body Mass Index (BMI): 26.6 Blood Pressure (mmHg): 143/79 Capillary Blood Glucose (mg/dl): 219 Reference Range: 80 - 120 mg / dl Airway Pulse Oximetry (%): 100 Electronic Signature(s) Signed: 09/04/2021 10:03:07 AM By: [name] CHT EMT BS , , Entered By: [name] on 09/04/2021 10:03:07

## 2021-09-04 NOTE — Progress Notes (Addendum)
Antonio, [name] ([id number]) Visit Report for 09/04/2021 HBO Details Patient Name: Date of Service: Antonio [name], Antonio [name] [name] 09/04/2021 9:00 A M Medical Record Number: [id number] Patient Account Number: [id number] Date of Birth/Sex: Treating RN: 22-Dec-1945 (75 y.o. Antonio [name], [name] Primary Care Leanore Biggers: [name] Other Clinician: [name] Referring Britiany Silbernagel: Treating Terree Gaultney/Extender: Krista Blue in Treatment: 3 HBO Treatment Course Details Treatment Course Number: 1 Ordering Koby Pickup: Bernerd Pho Treatments Ordered: otal 40 HBO Treatment Start Date: 08/23/2021 HBO Indication: Late Effect of Radiation HBO Treatment Details Treatment Number: 9 Patient Type: Outpatient Chamber Type: Monoplace Chamber Serial #: [id number] Treatment Protocol: 2.0 ATA with 90 minutes oxygen, and no air breaks Treatment Details Compression Rate Down: 2.0 psi / minute De-Compression Rate Up: 2.0 psi / minute Air breaks and breathing Decompress Decompress Compress Tx Pressure Begins Reached periods Begins Ends (leave unused spaces blank) Chamber Pressure (ATA 1 2 ------2 1 ) Clock Time (24 hr) 10:01 10:10 - - - - - - 11:40 11:48 Treatment Length: 107 (minutes) Treatment Segments: 4 Vital Signs Capillary Blood Glucose Reference Range: 80 - 120 mg / dl HBO Diabetic Blood Glucose Intervention Range: <131 mg/dl or >249 mg/dl Time Vitals Blood Respiratory Capillary Blood Glucose Pulse Action Type: Pulse: Temperature: Taken: Pressure: Rate: Glucose (mg/dl): Meter #: Oximetry (%) Taken: Pre 09:46 143/79 78 16 98.3 219 2 100 Post 11:53 135/74 72 16 98.4 185 2 100 Treatment Response Treatment Toleration: Well Treatment Completion Status: Treatment Completed without Adverse Event Physician HBO Attestation: I certify that I supervised this HBO treatment in accordance with Medicare guidelines. A trained emergency response team is readily available  per Yes hospital policies and procedures. Continue HBOT as ordered. Yes Electronic Signature(s) Signed: 09/04/2021 12:38:15 PM By: Kalman Shan DO Previous Signature: 09/04/2021 12:05:40 PM Version By: [name] CHT EMT BS , , Entered By: Kalman Shan on 09/04/2021 12:37:04 -------------------------------------------------------------------------------- HBO Safety Checklist Details Patient Name: Date of Service: Antonio [place] MA S A. 09/04/2021 9:00 A M Medical Record Number: [id number] Patient Account Number: [id number] Date of Birth/Sex: Treating RN: 1946-06-10 (75 y.o. Antonio [name], [name] Primary Care Advaith Lamarque: [name] Other Clinician: [name] Referring Charla Criscione: Treating Massai Hankerson/Extender: Krista Blue in Treatment: 3 HBO Safety Checklist Items Safety Checklist Consent Form Signed Patient voided / foley secured and emptied When did you last eato 0710 Last dose of injectable or oral agent 0645 Ostomy pouch emptied and vented if applicable NA All implantable devices assessed, documented and approved NA Intravenous access site secured and place NA Valuables secured Linens and cotton and cotton/polyester blend (less than 51% polyester) Personal oil-based products / skin lotions / body lotions removed Wigs or hairpieces removed NA Smoking or tobacco materials removed NA Books / newspapers / magazines / loose paper removed Cologne, aftershave, perfume and deodorant removed Jewelry removed (may wrap wedding band) Make-up removed NA Hair care products removed Battery operated devices (external) removed Heating patches and chemical warmers removed Titanium eyewear removed NA Nail polish cured greater than 10 hours NA Casting material cured greater than 10 hours NA Hearing aids removed NA Loose dentures or partials removed dentures removed Prosthetics have been removed NA Patient demonstrates correct use of  air break device (if applicable) Patient concerns have been addressed Patient grounding bracelet on and cord attached to chamber Specifics for Inpatients (complete in addition to above) Medication sheet sent with patient NA Intravenous medications needed or due during therapy sent with patient NA Drainage tubes (e.g.  nasogastric tube or chest tube secured and vented) NA Endotracheal or Tracheotomy tube secured NA Cuff deflated of air and inflated with saline NA Airway suctioned NA Notes Paper version used prior to treatment. Electronic Signature(s) Signed: 09/04/2021 10:12:44 AM By: [name] CHT EMT BS , , Entered By: [name] on 09/04/2021 10:12:44

## 2021-09-04 NOTE — Telephone Encounter (Signed)
Spoke with pt, he reports slight angina pain. He reports that he is going to the hyperbaric chamber due to bleeding from his bladder and this is supposed to help it in the long run. He goes 5 days a week for 40 days. He reports every time he walks to the car after the treatment he gets an aching pain in the left side of his chest. It will last 5-10 minutes and will go away. He also reports he can have the pain with stress. While talking to the patient on the phone the police pull him over for making a wrong turn. He reports he is having pain as we speak. He was advised to go to the ER. Patient voiced understanding.

## 2021-09-04 NOTE — Telephone Encounter (Signed)
Pt is needing to speak with a PA or nurse, didn't provide specific details... please advise

## 2021-09-04 NOTE — Telephone Encounter (Addendum)
Pt called back stating he didn't go to the emergency room as previously recommended but report he is no longer having chest pain. Pt is requesting an appointment for further evaluations.  Appointment scheduled for 11/28 with Sheela Stack, NP  Pt advised to report to ER if chest pain reoccur. Pt verbalized understanding.

## 2021-09-05 ENCOUNTER — Other Ambulatory Visit: Payer: Self-pay

## 2021-09-05 ENCOUNTER — Encounter (HOSPITAL_BASED_OUTPATIENT_CLINIC_OR_DEPARTMENT_OTHER): Payer: Medicare Other | Admitting: Physician Assistant

## 2021-09-05 DIAGNOSIS — N3041 Irradiation cystitis with hematuria: Secondary | ICD-10-CM | POA: Diagnosis not present

## 2021-09-05 LAB — GLUCOSE, CAPILLARY
Glucose-Capillary: 151 mg/dL — ABNORMAL HIGH (ref 70–99)
Glucose-Capillary: 213 mg/dL — ABNORMAL HIGH (ref 70–99)

## 2021-09-05 NOTE — Progress Notes (Signed)
SHOOTER, TANGEN ([id number]) Visit Report for 09/05/2021 SuperBill Details Patient Name: Date of Service: Antonio [name], Antonio [name] [place] 09/05/2021 Medical Record Number: [id number] Patient Account Number: [id number] Date of Birth/Sex: Treating RN: 1946-06-13 (75 y.o. 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

## 2021-09-05 NOTE — Progress Notes (Addendum)
Antonio [name], Antonio [name] ([id number]) Visit Report for 09/05/2021 HBO Details Patient Name: Date of Service: Antonio [name], Antonio [name] [name]. 09/05/2021 9:00 [name] Medical Record Number: [id number] Patient Account Number: [id number] Date of Birth/Sex: Treating RN: 13-Nov-1945 (75 y.o. [name], [name] Primary Care Tashaun Obey: [name] Other Clinician: [name] Referring Carely Nappier: Treating Miaya Lafontant/Extender: [name] in Treatment: 3 HBO Treatment Course Details Treatment Course Number: 1 Ordering Saniyya Gau: Bernerd Pho Treatments Ordered: otal 40 HBO Treatment Start Date: 08/23/2021 HBO Indication: Late Effect of Radiation HBO Treatment Details Treatment Number: 10 Patient Type: Outpatient Chamber Type: Monoplace Chamber Serial #: [id number] Treatment Protocol: 2.0 ATA with 90 minutes oxygen, and no air breaks Treatment Details Compression Rate Down: 2.0 psi / minute De-Compression Rate Up: 2.0 psi / minute Air breaks and breathing Decompress Decompress Compress Tx Pressure Begins Reached periods Begins Ends (leave unused spaces blank) Chamber Pressure (ATA 1 2 ------2 1 ) Clock Time (24 hr) 09:47 09:55 - - - - - - 11:25 11:33 Treatment Length: 106 (minutes) Treatment Segments: 4 Vital Signs Capillary Blood Glucose Reference Range: 80 - 120 mg / dl HBO Diabetic Blood Glucose Intervention Range: <131 mg/dl or >249 mg/dl Time Vitals Blood Respiratory Capillary Blood Glucose Pulse Action Type: Pulse: Temperature: Taken: Pressure: Rate: Glucose (mg/dl): Meter #: Oximetry (%) Taken: Pre 09:31 140/72 82 16 98.6 151 Post 11:36 147/84 75 18 98.3 213 Treatment Response Treatment Toleration: Well Treatment Completion Status: Treatment Completed without Adverse Event Electronic Signature(s) Signed: 09/05/2021 11:51:01 AM By: [name] CHT EMT BS , , Signed: 09/05/2021 5:33:12 PM By: [name] Entered By: [name] on  09/05/2021 11:51:00 -------------------------------------------------------------------------------- HBO Safety Checklist Details Patient Name: Date of Service: Antonio [name]. 09/05/2021 9:00 [name] Medical Record Number: [id number] Patient Account Number: [id number] Date of Birth/Sex: Treating RN: Oct 24, 1945 (75 y.o. [name], [name] Primary Care Morganna Styles: [name] Other Clinician: [name] Referring Coren Sagan: Treating Carr Shartzer/Extender: [name] in Treatment: 3 HBO Safety Checklist Items Safety Checklist Consent Form Signed Patient voided / foley secured and emptied When did you last eato 0745 Last dose of injectable or oral agent 0745 Ostomy pouch emptied and vented if applicable NA All implantable devices assessed, documented and approved NA Intravenous access site secured and place NA Valuables secured Linens and cotton and cotton/polyester blend (less than 51% polyester) Personal oil-based products / skin lotions / body lotions removed Wigs or hairpieces removed NA Smoking or tobacco materials removed NA Books / newspapers / magazines / loose paper removed Cologne, aftershave, perfume and deodorant removed Jewelry removed (may wrap wedding band) Make-up removed NA Hair care products removed Battery operated devices (external) removed Heating patches and chemical warmers removed Titanium eyewear removed NA Nail polish cured greater than 10 hours NA Casting material cured greater than 10 hours NA Hearing aids removed NA Loose dentures or partials removed dentures removed Prosthetics have been removed NA Patient demonstrates correct use of air break device (if applicable) Patient concerns have been addressed Patient grounding bracelet on and cord attached to chamber Specifics for Inpatients (complete in addition to above) Medication sheet sent with patient NA Intravenous medications needed or due during  therapy sent with patient NA Drainage tubes (e.g. nasogastric tube or chest tube secured and vented) NA Endotracheal or Tracheotomy tube secured NA Cuff deflated of air and inflated with saline NA Airway suctioned NA Electronic Signature(s) Signed: 09/05/2021 10:42:22 AM By: [name] CHT EMT  BS , , Entered By: [name] on 09/05/2021 10:42:21

## 2021-09-05 NOTE — Progress Notes (Addendum)
Antonio [name], Antonio [name] ([id number]) Visit Report for 09/05/2021 Arrival Information Details Patient Name: Date of Service: Antonio [name], Antonio [name] [name] 09/05/2021 9:00 A M Medical Record Number: [id number] Patient Account Number: [id number] Date of Birth/Sex: Treating RN: October 30, 1945 (75 y.o. [name], [name] Primary Care Taiwana Willison: [name] Other Clinician: [name] Referring Willett Lefeber: Treating Tyrrell Stephens/Extender: [name] in Treatment: 3 Visit Information History Since Last Visit All ordered tests and consults were completed: Yes Patient Arrived: Ambulatory Added or deleted any medications: No Arrival Time: 09:29 Any new allergies or adverse reactions: No Accompanied By: self Had a fall or experienced change in No Transfer Assistance: None activities of daily living that may affect Patient Identification Verified: Yes risk of falls: Secondary Verification Process Completed: Yes Signs or symptoms of abuse/neglect since last visito No Patient Requires Transmission-Based Precautions: No Hospitalized since last visit: No Patient Has Alerts: No Implantable device outside of the clinic excluding No cellular tissue based products placed in the center since last visit: Pain Present Now: No Electronic Signature(s) Signed: 09/05/2021 10:22:42 AM By: [name] CHT EMT BS , , Entered By: [name] on 09/05/2021 10:22:42 -------------------------------------------------------------------------------- Encounter Discharge Information Details Patient Name: Date of Service: Antonio Baltimore MA S A. 09/05/2021 9:00 A M Medical Record Number: [id number] Patient Account Number: [id number] Date of Birth/Sex: Treating RN: 01/13/1946 (75 y.o. [name] Primary Care Dafney Farler: [name] Other Clinician: [name] Referring Riona Lahti: Treating Daelen Belvedere/Extender: [name] in Treatment: 3 Encounter  Discharge Information Items Discharge Condition: Stable Ambulatory Status: Ambulatory Discharge Destination: Home Transportation: Private Auto Accompanied By: self Schedule Follow-up Appointment: No Clinical Summary of Care: Electronic Signature(s) Signed: 09/05/2021 11:51:58 AM By: [name] CHT EMT BS , , Entered By: [name] on 09/05/2021 11:51:58 -------------------------------------------------------------------------------- Vitals Details Patient Name: Date of Service: Antonio Baltimore MA S A. 09/05/2021 9:00 A M Medical Record Number: [id number] Patient Account Number: [id number] Date of Birth/Sex: Treating RN: [birth year] (75 y.o. [name], [name] Primary Care Pinkney Venard: [name] Other Clinician: [name] Referring Lionell Matuszak: Treating Vergene Marland/Extender: [name] in Treatment: 3 Vital Signs Time Taken: 09:31 Temperature (F): 98.6 Height (in): 67 Pulse (bpm): 82 Weight (lbs): 170 Respiratory Rate (breaths/min): 16 Body Mass Index (BMI): 26.6 Blood Pressure (mmHg): 140/72 Capillary Blood Glucose (mg/dl): 151 Reference Range: 80 - 120 mg / dl Electronic Signature(s) Signed: 09/05/2021 10:36:03 AM By: [name] CHT EMT BS , , Entered By: [name] on 09/05/2021 10:36:02

## 2021-09-07 ENCOUNTER — Encounter (HOSPITAL_BASED_OUTPATIENT_CLINIC_OR_DEPARTMENT_OTHER): Payer: Medicare Other | Admitting: Internal Medicine
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
## 2021-09-10 ENCOUNTER — Encounter (HOSPITAL_BASED_OUTPATIENT_CLINIC_OR_DEPARTMENT_OTHER): Payer: Medicare Other | Admitting: Internal Medicine

## 2021-09-10 ENCOUNTER — Other Ambulatory Visit: Payer: Self-pay

## 2021-09-10 ENCOUNTER — Ambulatory Visit (INDEPENDENT_AMBULATORY_CARE_PROVIDER_SITE_OTHER): Payer: Medicare Other | Admitting: General Practice

## 2021-09-10 ENCOUNTER — Encounter (HOSPITAL_BASED_OUTPATIENT_CLINIC_OR_DEPARTMENT_OTHER): Payer: Self-pay | Admitting: General Practice

## 2021-09-10 VITALS — BP 122/72 | HR 75 | Ht 67.5 in | Wt 172.0 lb

## 2021-09-10 DIAGNOSIS — R350 Frequency of micturition: Secondary | ICD-10-CM

## 2021-09-10 DIAGNOSIS — I25118 Atherosclerotic heart disease of native coronary artery with other forms of angina pectoris: Secondary | ICD-10-CM

## 2021-09-10 DIAGNOSIS — R319 Hematuria, unspecified: Secondary | ICD-10-CM

## 2021-09-10 DIAGNOSIS — Z8546 Personal history of malignant neoplasm of prostate: Secondary | ICD-10-CM | POA: Diagnosis not present

## 2021-09-10 DIAGNOSIS — N3041 Irradiation cystitis with hematuria: Secondary | ICD-10-CM

## 2021-09-10 LAB — GLUCOSE, CAPILLARY
Glucose-Capillary: 182 mg/dL — ABNORMAL HIGH (ref 70–99)
Glucose-Capillary: 156 mg/dL — ABNORMAL HIGH (ref 70–99)

## 2021-09-10 MED ORDER — ISOSORBIDE MONONITRATE ER 120 MG PO TB24: 120.0000 mg | ORAL_TABLET | Freq: Every day | ORAL | 3 refills | Status: DC

## 2021-09-10 NOTE — Progress Notes (Signed)
[name], Antonio ([id number]) Visit Report for 09/10/2021 SuperBill Details Patient Name: Date of Service: [name], Antonio [place] 09/10/2021 Medical Record Number: [id number] Patient Account Number: [id number] Date of Birth/Sex: Treating RN: July 31, 1946 (75 y.o. [name] Primary Care Provider: [name] Other Clinician: [name] Referring Provider: Treating Provider/Extender: Krista Blue in Treatment: 4 Diagnosis Coding ICD-10 Codes Code Description N30.41 Irradiation cystitis with hematuria Z85.46 Personal history of malignant neoplasm of prostate R35.0 Frequency of micturition Facility Procedures CPT4 Code Description Modifier Quantity [id number]-(Facility Use Only) HBOT full body chamber, 26min , 4 ICD-10 Diagnosis Description N30.41 Irradiation cystitis with hematuria Z85.46 Personal history of malignant neoplasm of prostate R35.0 Frequency of micturition Physician Procedures Quantity CPT4 Code Description Modifier [id number] - WC PHYS HYPERBARIC OXYGEN THERAPY 1 ICD-10 Diagnosis Description N30.41 Irradiation cystitis with hematuria Z85.46 Personal history of malignant neoplasm of prostate R35.0 Frequency of micturition Electronic Signature(s) Signed: 09/10/2021 11:58:34 AM By: [name] CHT EMT BS , , Signed: 09/10/2021 12:11:10 PM By: Kalman Shan DO Entered By: [name] on 09/10/2021 11:58:34

## 2021-09-10 NOTE — Patient Instructions (Signed)
Medication Instructions:  Your physician has recommended you make the following change in your medication:   Increase: Imdur to 120 mg daily   *If you need a refill on your cardiac medications before your next appointment, please call your pharmacy*   Lab Work: Your physician recommends that you return for lab work in: today- CBC, BMP (3rd floor)  If you have labs (blood work) drawn today and your tests are completely normal, you will receive your results only by: MyChart Message (if you have MyChart) OR A paper copy in the mail If you have any lab test that is abnormal or we need to change your treatment, we will call you to review the results.   Testing/Procedures: None ordered today   Follow-Up: At [facility], you and your health needs are our priority.  As part of our continuing mission to provide you with exceptional heart care, we have created designated Provider Care Teams.  These Care Teams include your primary Cardiologist (physician) and Advanced Practice Providers (APPs -  Physician Assistants and Nurse Practitioners) who all work together to provide you with the care you need, when you need it.  We recommend signing up for the patient portal called "MyChart".  Sign up information is provided on this After Visit Summary.  MyChart is used to connect with patients for Virtual Visits (Telemedicine).  Patients are able to view lab/test results, encounter notes, upcoming appointments, etc.  Non-urgent messages can be sent to your provider as well.   To learn more about what you can do with MyChart, go to [email or web address].    Your next appointment:   1-2 month(s)  The format for your next appointment:   In Person  Provider:  Peter Martinique, MD or APPs If MD is not listed, click here to update    :1}    Other Instructions Iron-Rich Diet Iron is a mineral that helps your body produce hemoglobin. Hemoglobin is a protein in red blood cells that carries oxygen to your  body's tissues. Eating too little iron may cause you to feel weak and tired, and it can increase your risk of infection. Iron is naturally found in many foods, and many foods have iron added to them (are iron-fortified). You may need to follow an iron-rich diet if you do not have enough iron in your body due to certain medical conditions. The amount of iron that you need each day depends on your age, your sex, and any medical conditions you have. Follow instructions from your health care provider or a dietitian about how much iron you should eat each day. What are tips for following this plan? Reading food labels Check food labels to see how many milligrams (mg) of iron are in each serving. Cooking Cook foods in pots and pans that are made from iron. Take these steps to make it easier for your body to absorb iron from certain foods: Soak beans overnight before cooking. Soak whole grains overnight and drain them before using. Ferment flours before baking, such as by using yeast in bread dough. Meal planning When you eat foods that contain iron, you should eat them with foods that are high in vitamin C. These include oranges, peppers, tomatoes, potatoes, and mangoes. Vitamin C helps your body absorb iron. Certain foods and drinks prevent your body from absorbing iron properly. Avoid eating these foods in the same meal as iron-rich foods or with iron supplements. These foods include: Coffee, black tea, and red wine. Milk, dairy products, and  foods that are high in calcium. Beans and soybeans. Whole grains. General information Take iron supplements only as told by your health care provider. An overdose of iron can be life-threatening. If you were prescribed iron supplements, take them with orange juice or a vitamin C supplement. When you eat iron-fortified foods or take an iron supplement, you should also eat foods that naturally contain iron, such as meat, poultry, and fish. Eating naturally  iron-rich foods helps your body absorb the iron that is added to other foods or contained in a supplement. Iron from animal sources is better absorbed than iron from plant sources. What foods should I eat? Fruits Prunes. Raisins. Eat fruits high in vitamin C, such as oranges, grapefruits, and strawberries, with iron-rich foods. Vegetables Spinach (cooked). Green peas. Broccoli. Fermented vegetables. Eat vegetables high in vitamin C, such as leafy greens, potatoes, bell peppers, and tomatoes, with iron-rich foods. Grains Iron-fortified breakfast cereal. Iron-fortified whole-wheat bread. Enriched rice. Sprouted grains. Meats and other proteins Beef liver. Beef. [place]. Chicken. Oysters. Shrimp. [place]. Sardines. Chickpeas. Nuts. Tofu. Pumpkin seeds. Beverages Tomato juice. Fresh orange juice. Prune juice. Hibiscus tea. Iron-fortified instant breakfast shakes. Sweets and desserts Blackstrap molasses. Seasonings and condiments Tahini. Fermented soy sauce. Other foods Wheat germ. The items listed above may not be a complete list of recommended foods and beverages. Contact a dietitian for more information. What foods should I limit? These are foods that should be limited while eating iron-rich foods as they can reduce the absorption of iron in your body. Grains Whole grains. Bran cereal. Bran flour. Meats and other proteins Soybeans. Products made from soy protein. Black beans. Lentils. Mung beans. Split peas. Dairy Milk. Cream. Cheese. Yogurt. Cottage cheese. Beverages Coffee. Black tea. Red wine. Sweets and desserts Cocoa. Chocolate. Ice cream. Seasonings and condiments Basil. Oregano. Large amounts of parsley. The items listed above may not be a complete list of foods and beverages you should limit. Contact a dietitian for more information. Summary Iron is a mineral that helps your body produce hemoglobin. Hemoglobin is a protein in red blood cells that carries oxygen to your body's  tissues. Iron is naturally found in many foods, and many foods have iron added to them (are iron-fortified). When you eat foods that contain iron, you should eat them with foods that are high in vitamin C. Vitamin C helps your body absorb iron. Certain foods and drinks prevent your body from absorbing iron properly, such as whole grains and dairy products. You should avoid eating these foods in the same meal as iron-rich foods or with iron supplements. This information is not intended to replace advice given to you by your health care provider. Make sure you discuss any questions you have with your health care provider. Document Revised: 09/11/2020 Document Reviewed: 09/11/2020 Elsevier Patient Education  2022 [place] Heart Association Recommendations for Physical Activity in Adults  Are you fitting in at least 150 minutes (2.5 hours) of heart-pumping physical activity per week? If not, you're not alone. Only about one in five adults and teens get enough exercise to maintain good health. Being more active can help all people think, feel and sleep better and perform daily tasks more easily. And if you're sedentary, sitting less is a great place to start.  These recommendations are based on the Physical Activity Guidelines for Americans, 2nd edition, published by the U.S. Department of Health and Financial controller, Office of Disease Prevention and Health Promotion. They recommend how much physical activity  we need to be healthy. The guidelines are based on current scientific evidence supporting the connections between physical activity, overall health and well-being, disease prevention and quality of life.  AHA Physical Activity Recommendations for Adults     Get at least 150 minutes per week of moderate-intensity aerobic activity or 75 minutes per week of vigorous aerobic activity, or a combination of both, preferably spread throughout the week.  Add moderate- to high-intensity  muscle-strengthening activity (such as resistance or weights) on at least 2 days per week. Spend less time sitting. Even light-intensity activity can offset some of the risks of being sedentary.  Gain even more benefits by being active at least 300 minutes (5 hours) per week. Increase amount and intensity gradually over time.   What is intensity?  Physical activity is anything that moves your body and burns calories. This includes things like walking, climbing stairs and stretching.  Aerobic (or "cardio") activity gets your heart rate up and benefits your heart by improving cardiorespiratory fitness. When done at moderate intensity, your heart will beat faster and you'll breathe harder than normal, but you'll still be able to talk. Think of it as a medium or moderate amount of effort.  Examples of moderate-intensity aerobic activities:  brisk walking (at least 2.5 miles per hour) water aerobics dancing (ballroom or social) gardening tennis (doubles) biking slower than 10 miles per hour Vigorous intensity activities will push your body a little further. They will require a higher amount of effort. You'll probably get warm and begin to sweat. You won't be able to talk much without getting out of breath.  Examples of vigorous-intensity aerobic activities:  hiking uphill or with a heavy backpack running swimming laps aerobic dancing heavy yardwork like continuous digging or hoeing tennis (singles) cycling 10 miles per hour or faster jumping rope Knowing your target heart rate can also help you track the intensity of your activities.  For maximum benefits, include both moderate- and vigorous-intensity activity in your routine along with strengthening and stretching exercises.  What if I'm just starting to get active?  Don't worry if you can't reach 150 minutes per week just yet. Everyone has to start somewhere. Even if you've been sedentary for years, today is the day you can begin  to make healthy changes in your life. Set a reachable goal for today. You can work up toward the recommended amount by increasing your time as you get stronger. Don't let all-or-nothing thinking keep you from doing what you can every day.  The simplest way to get moving and improve your health is to start walking. It's free, easy and can be done just about anywhere, even in place.  Any amount of movement is better than none. And you can break it up into short bouts of activity throughout the day. Taking a brisk walk for five or ten minutes a few times a day will add up.  If you have a chronic condition or disability, talk with your healthcare provider about what types and amounts of physical activity are right for you before making too many changes. But don't wait! Get started today by simply sitting less and moving more, whatever that looks like for you.  The takeaway:  Move more, with more intensity, and sit less. Science has linked being inactive and sitting too much with higher risk of heart disease, type 2 diabetes, colon and lung cancers, and early death.  It's clear that being more active benefits everyone and helps [place] live longer,  healthier lives.  Here are some of the big wins:  Lower risk of heart disease, stroke, type 2 diabetes, high blood pressure, dementia and Alzheimer's, several types of cancer, and some complications of pregnancy\  Better sleep, including improvements in insomnia and obstructive sleep apnea  Improved cognition, including memory, attention and processing speed  Less weight gain, obesity and related chronic health conditions  Better bone health and balance, with less risk of injury from falls  Fewer symptoms of depression and anxiety  Better quality of life and sense of overall well-being

## 2021-09-10 NOTE — Progress Notes (Addendum)
[name], [name] ([id number]) Visit Report for 09/10/2021 HBO Details Patient Name: Date of Service: Antonio [name], Antonio [name] [name]. 09/10/2021 9:00 [name] Medical Record Number: [id number] Patient Account Number: [id number] Date of Birth/Sex: Treating RN: 01-Aug-1946 (75 y.o. [name], [name] Primary Care Rebecca Cairns: [name] Other Clinician: [name] Referring Adrean Findlay: Treating Yenesis Even/Extender: Krista Blue in Treatment: 4 HBO Treatment Course Details Treatment Course Number: 1 Ordering Anylah Scheib: Bernerd Pho Treatments Ordered: otal 40 HBO Treatment Start Date: 08/23/2021 HBO Indication: Late Effect of Radiation HBO Treatment Details Treatment Number: 11 Patient Type: Outpatient Chamber Type: Monoplace Chamber Serial #: [id number] Treatment Protocol: 2.0 ATA with 90 minutes oxygen, and no air breaks Treatment Details Compression Rate Down: 2.0 psi / minute De-Compression Rate Up: 2.0 psi / minute Air breaks and breathing Decompress Decompress Compress Tx Pressure Begins Reached periods Begins Ends (leave unused spaces blank) Chamber Pressure (ATA 1 2 ------2 1 ) Clock Time (24 hr) 09:47 09:55 - - - - - - 11:25 11:33 Treatment Length: 106 (minutes) Treatment Segments: 4 Vital Signs Capillary Blood Glucose Reference Range: 80 - 120 mg / dl HBO Diabetic Blood Glucose Intervention Range: <131 mg/dl or >249 mg/dl Time Vitals Blood Respiratory Capillary Blood Glucose Pulse Action Type: Pulse: Temperature: Taken: Pressure: Rate: Glucose (mg/dl): Meter #: Oximetry (%) Taken: Pre 09:34 134/73 81 14 99.2 182 Post 11:36 140/86 67 12 98.6 156 Treatment Response Treatment Toleration: Well Treatment Completion Status: Treatment Completed without Adverse Event Physician HBO Attestation: I certify that I supervised this HBO treatment in accordance with Medicare guidelines. [name] trained emergency response team is readily available per  Yes hospital policies and procedures. Continue HBOT as ordered. Yes Electronic Signature(s) Signed: 09/10/2021 12:11:10 PM By: Kalman Shan DO Previous Signature: 09/10/2021 11:57:28 AM Version By: [name] CHT EMT BS , , Entered By: Kalman Shan on 09/10/2021 12:05:00 -------------------------------------------------------------------------------- HBO Safety Checklist Details Patient Name: Date of Service: Antonio [name]. 09/10/2021 9:00 [name] Medical Record Number: [id number] Patient Account Number: [id number] Date of Birth/Sex: Treating RN: July 23, 1946 (75 y.o. [name], [name] Primary Care Emelly Wurtz: [name] Other Clinician: [name] Referring Maitland Lesiak: Treating Jennine Peddy/Extender: Krista Blue in Treatment: 4 HBO Safety Checklist Items Safety Checklist Consent Form Signed Patient voided / foley secured and emptied When did you last eato 0745 Last dose of injectable or oral agent 0745 Ostomy pouch emptied and vented if applicable NA All implantable devices assessed, documented and approved NA Intravenous access site secured and place NA Valuables secured Linens and cotton and cotton/polyester blend (less than 51% polyester) Personal oil-based products / skin lotions / body lotions removed Wigs or hairpieces removed NA Smoking or tobacco materials removed Books / newspapers / magazines / loose paper removed Cologne, aftershave, perfume and deodorant removed Jewelry removed (may wrap wedding band) Make-up removed NA Hair care products removed Battery operated devices (external) removed Heating patches and chemical warmers removed Titanium eyewear removed NA Nail polish cured greater than 10 hours NA Casting material cured greater than 10 hours NA Hearing aids removed NA Loose dentures or partials removed dentures removed Prosthetics have been removed NA Patient demonstrates correct use of air  break device (if applicable) Patient concerns have been addressed Patient grounding bracelet on and cord attached to chamber Specifics for Inpatients (complete in addition to above) Medication sheet sent with patient NA Intravenous medications needed or due during therapy sent with patient NA Drainage tubes (e.g. nasogastric tube or chest tube  secured and vented) NA Endotracheal or Tracheotomy tube secured NA Cuff deflated of air and inflated with saline NA Airway suctioned NA Notes Paper version used prior to treatment. Electronic Signature(s) Signed: 09/10/2021 11:56:13 AM By: [name] CHT EMT BS , , Entered By: [name] on 09/10/2021 11:56:12

## 2021-09-10 NOTE — Progress Notes (Addendum)
Antonio [name], Antonio [name] ([id number]) Visit Report for 09/10/2021 Arrival Information Details Patient Name: Date of Service: [name], [name] 09/10/2021 9:00 A M Medical Record Number: [id number] Patient Account Number: [id number] Date of Birth/Sex: Treating RN: September 22, 1946 (75 y.o. [name], [name] Primary Care Terez Montee: [name] Other Clinician: [name] Referring Maddelynn Moosman: Treating Anandi Abramo/Extender: Krista Blue in Treatment: 4 Visit Information History Since Last Visit All ordered tests and consults were completed: Yes Patient Arrived: Ambulatory Added or deleted any medications: No Arrival Time: 09:29 Any new allergies or adverse reactions: No Accompanied By: self Had a fall or experienced change in No Transfer Assistance: None activities of daily living that may affect Patient Identification Verified: Yes risk of falls: Secondary Verification Process Completed: Yes Signs or symptoms of abuse/neglect since last visito No Patient Requires Transmission-Based Precautions: No Hospitalized since last visit: No Patient Has Alerts: No Implantable device outside of the clinic excluding No cellular tissue based products placed in the center since last visit: Pain Present Now: No Electronic Signature(s) Signed: 09/10/2021 11:53:24 AM By: [name] CHT EMT BS , , Entered By: [name] on 09/10/2021 11:53:23 -------------------------------------------------------------------------------- Encounter Discharge Information Details Patient Name: Date of Service: [name] 09/10/2021 9:00 A M Medical Record Number: [id number] Patient Account Number: [id number] Date of Birth/Sex: Treating RN: Jul 27, 1946 (75 y.o. [name] Primary Care Amari Burnsworth: [name] Other Clinician: [name] Referring Joleena Weisenburger: Treating Noora Locascio/Extender: Krista Blue in Treatment: 4 Encounter  Discharge Information Items Discharge Condition: Stable Ambulatory Status: Ambulatory Discharge Destination: Home Transportation: Private Auto Accompanied By: self Schedule Follow-up Appointment: No Clinical Summary of Care: Electronic Signature(s) Signed: 09/10/2021 12:00:16 PM By: [name] CHT EMT BS , , Entered By: [name] on 09/10/2021 12:00:15 -------------------------------------------------------------------------------- Vitals Details Patient Name: Date of Service: [name] 09/10/2021 9:00 A M Medical Record Number: [id number] Patient Account Number: [id number] Date of Birth/Sex: Treating RN: 09-Nov-1945 (75 y.o. [name], [name] Primary Care Makyiah Lie: [name] Other Clinician: [name] Referring Yenny Kosa: Treating Grethel Zenk/Extender: Krista Blue in Treatment: 4 Vital Signs Time Taken: 09:34 Temperature (F): 99.2 Height (in): 67 Pulse (bpm): 81 Weight (lbs): 170 Respiratory Rate (breaths/min): 14 Body Mass Index (BMI): 26.6 Blood Pressure (mmHg): 134/73 Capillary Blood Glucose (mg/dl): 182 Reference Range: 80 - 120 mg / dl Electronic Signature(s) Signed: 09/10/2021 11:54:44 AM By: [name] CHT EMT BS , , Entered By: [name] on 09/10/2021 11:54:43

## 2021-09-11 ENCOUNTER — Encounter (HOSPITAL_BASED_OUTPATIENT_CLINIC_OR_DEPARTMENT_OTHER): Payer: Medicare Other | Admitting: Internal Medicine

## 2021-09-11 DIAGNOSIS — N3041 Irradiation cystitis with hematuria: Secondary | ICD-10-CM | POA: Diagnosis not present

## 2021-09-11 LAB — BASIC METABOLIC PANEL
BUN/Creatinine Ratio: 13 (ref 10–24)
BUN: 14 mg/dL (ref 8–27)
CO2: 21 mmol/L (ref 20–29)
Calcium: 9 mg/dL (ref 8.6–10.2)
Chloride: 107 mmol/L — ABNORMAL HIGH (ref 96–106)
Creatinine, Ser: 1.09 mg/dL (ref 0.76–1.27)
Glucose: 121 mg/dL — ABNORMAL HIGH (ref 70–99)
Potassium: 3.8 mmol/L (ref 3.5–5.2)
Sodium: 142 mmol/L (ref 134–144)
eGFR: 72 mL/min/{1.73_m2} (ref >59)

## 2021-09-11 LAB — CBC
Hematocrit: 32.1 % — ABNORMAL LOW (ref 37.5–51.0)
Hemoglobin: 9.8 g/dL — ABNORMAL LOW (ref 13.0–17.7)
MCH: 25.5 pg — ABNORMAL LOW (ref 26.6–33.0)
MCHC: 30.5 g/dL — ABNORMAL LOW (ref 31.5–35.7)
MCV: 84 fL (ref 79–97)
Platelets: 170 10*3/uL (ref 150–450)
RBC: 3.84 x10E6/uL — ABNORMAL LOW (ref 4.14–5.80)
RDW: 17 % — ABNORMAL HIGH (ref 11.6–15.4)
WBC: 4 10*3/uL (ref 3.4–10.8)

## 2021-09-11 LAB — GLUCOSE, CAPILLARY
Glucose-Capillary: 166 mg/dL — ABNORMAL HIGH (ref 70–99)
Glucose-Capillary: 112 mg/dL — ABNORMAL HIGH (ref 70–99)

## 2021-09-11 NOTE — Progress Notes (Addendum)
SHOOTER, TANGEN ([id number]) Visit Report for 09/11/2021 HBO Details Patient Name: Date of Service: Antonio Antonio, Antonio Antonio [place] Antonio. 09/11/2021 9:00 Antonio Antonio Medical Record Number: [id number] Patient Account Number: [id number] Date of Birth/Sex: Treating RN: 1946-08-23 (75 y.o. [name] Primary Care Anmol Paschen: [name] Other Clinician: [name] Referring Quianna Avery: Treating Tahjai Schetter/Extender: [name] in Treatment: 4 HBO Treatment Course Details Treatment Course Number: 1 Ordering Shterna Laramee: Bernerd Pho Treatments Ordered: otal 40 HBO Treatment Start Date: 08/23/2021 HBO Indication: Late Effect of Radiation HBO Treatment Details Treatment Number: 12 Patient Type: Outpatient Chamber Type: Monoplace Chamber Serial #: [id number] Treatment Protocol: 2.0 ATA with 90 minutes oxygen, and no air breaks Treatment Details Compression Rate Down: 2.0 psi / minute De-Compression Rate Up: 2.0 psi / minute Air breaks and breathing Decompress Decompress Compress Tx Pressure Begins Reached periods Begins Ends (leave unused spaces blank) Chamber Pressure (ATA 1 2 ------2 1 ) Clock Time (24 hr) 09:30 09:38 - - - - - - 11:08 11:16 Treatment Length: 106 (minutes) Treatment Segments: 4 Vital Signs Capillary Blood Glucose Reference Range: 80 - 120 mg / dl HBO Diabetic Blood Glucose Intervention Range: <131 mg/dl or >249 mg/dl Type: Time Vitals Blood Pulse: Respiratory Temperature: Capillary Blood Glucose Pulse Action Taken: Pressure: Rate: Glucose (mg/dl): Meter #: Oximetry (%) Taken: Pre 09:11 135/75 81 18 98.2 166 100 Post 11:21 138/79 68 18 98.6 112 discharge per protocol >101 mg/dL Treatment Response Treatment Toleration: Well Treatment Completion Status: Treatment Completed without Adverse Event Electronic Signature(s) Signed: 09/11/2021 2:35:03 PM By: [name] CHT EMT BS , , Signed: 09/11/2021 5:06:47 PM By: [name]  MD Entered By: [name] on 09/11/2021 14:35:03 -------------------------------------------------------------------------------- HBO Safety Checklist Details Patient Name: Date of Service: Antonio Antonio. 09/11/2021 9:00 Antonio Antonio Medical Record Number: [id number] Patient Account Number: [id number] Date of Birth/Sex: Treating RN: [birth year] (75 y.o. [name] Primary Care Mattea Seger: [name] Other Clinician: [name] Referring Nira Visscher: Treating Savera Donson/Extender: [name] in Treatment: 4 HBO Safety Checklist Items Safety Checklist Consent Form Signed Patient voided / foley secured and emptied When did you last eato 0745 Last dose of injectable or oral agent 0745 Ostomy pouch emptied and vented if applicable NA All implantable devices assessed, documented and approved NA Intravenous access site secured and place NA Valuables secured Linens and cotton and cotton/polyester blend (less than 51% polyester) Personal oil-based products / skin lotions / body lotions removed Wigs or hairpieces removed NA Smoking or tobacco materials removed NA Books / newspapers / magazines / loose paper removed Cologne, aftershave, perfume and deodorant removed Jewelry removed (may wrap wedding band) Make-up removed NA Hair care products removed NA Battery operated devices (external) removed Heating patches and chemical warmers removed Titanium eyewear removed NA Nail polish cured greater than 10 hours NA Casting material cured greater than 10 hours NA Hearing aids removed NA Loose dentures or partials removed dentures removed Prosthetics have been removed NA Patient demonstrates correct use of air break device (if applicable) Patient concerns have been addressed Patient grounding bracelet on and cord attached to chamber Specifics for Inpatients (complete in addition to above) Medication sheet sent with  patient NA Intravenous medications needed or due during therapy sent with patient NA Drainage tubes (e.g. nasogastric tube or chest tube secured and vented) NA Endotracheal or Tracheotomy tube secured NA Cuff deflated of air and inflated with saline NA Airway suctioned NA Notes Paper version used prior to treatment.  Electronic Signature(s) Signed: 09/11/2021 2:33:56 PM By: [name] CHT EMT BS , , Entered By: [name] on 09/11/2021 14:33:56

## 2021-09-11 NOTE — Progress Notes (Signed)
DONOVEN, PETT ([id number]) Visit Report for 09/11/2021 SuperBill Details Patient Name: Date of Service: ESDRAS, DELAIR [place] 09/11/2021 Medical Record Number: [id number] Patient Account Number: [id number] Date of Birth/Sex: Treating RN: 16-Sep-1946 (75 y.o. 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

## 2021-09-12 ENCOUNTER — Other Ambulatory Visit: Payer: Self-pay

## 2021-09-12 ENCOUNTER — Encounter (HOSPITAL_BASED_OUTPATIENT_CLINIC_OR_DEPARTMENT_OTHER): Payer: Medicare Other | Admitting: Physician Assistant

## 2021-09-12 DIAGNOSIS — N3041 Irradiation cystitis with hematuria: Secondary | ICD-10-CM | POA: Diagnosis not present

## 2021-09-12 LAB — GLUCOSE, CAPILLARY
Glucose-Capillary: 135 mg/dL — ABNORMAL HIGH (ref 70–99)
Glucose-Capillary: 123 mg/dL — ABNORMAL HIGH (ref 70–99)

## 2021-09-12 NOTE — Progress Notes (Addendum)
Antonio, [name] ([id number]) Visit Report for 09/12/2021 HBO Details Patient Name: Date of Service: Antonio [name], Antonio [name] [name]. 09/12/2021 9:00 [name] Medical Record Number: [id number] Patient Account Number: [id number] Date of Birth/Sex: Treating RN: [birth year] (75 y.o. Antonio [name], Antonio.[name] Primary Care Antonio [name]: Antonio [name] Other Clinician: Donavan [name] Referring Antonio [name]: Treating Antonio [name]/Extender: Antonio [name] in Treatment: 4 HBO Treatment Course Details Treatment Course Number: 1 Ordering Antonio [name]: Antonio [name] Treatments Ordered: otal 40 HBO Treatment Start Date: 08/23/2021 HBO Indication: Late Effect of Radiation HBO Treatment Details Treatment Number: 13 Patient Type: Outpatient Chamber Type: Monoplace Chamber Serial #: [id number] Treatment Protocol: 2.0 ATA with 90 minutes oxygen, and no air breaks Treatment Details Compression Rate Down: 2.0 psi / minute De-Compression Rate Up: 2.0 psi / minute Air breaks and breathing Decompress Decompress Compress Tx Pressure Begins Reached periods Begins Ends (leave unused spaces blank) Chamber Pressure (ATA 1 2 ------2 1 ) Clock Time (24 hr) 09:39 09:47 - - - - - - 11:17 11:25 Treatment Length: 106 (minutes) Treatment Segments: 4 Vital Signs Capillary Blood Glucose Reference Range: 80 - 120 mg / dl HBO Diabetic Blood Glucose Intervention Range: <131 mg/dl or >249 mg/dl Type: Time Vitals Blood Pulse: Respiratory Temperature: Capillary Blood Glucose Pulse Action Taken: Pressure: Rate: Glucose (mg/dl): Meter #: Oximetry (%) Taken: Pre 09:23 135/67 72 16 98.4 135 Post 11:28 159/79 66 16 98.2 123 discharge per protocol >101 mg/dL Treatment Response Treatment Toleration: Well Treatment Completion Status: Treatment Completed without Adverse Event Electronic Signature(s) Signed: 09/12/2021 12:46:58 PM By: Antonio [name] CHT EMT BS , , Signed: 09/12/2021 6:11:30 PM By: Antonio Keeler  PA-C Entered By: Antonio [name] on 09/12/2021 12:46:57 -------------------------------------------------------------------------------- HBO Safety Checklist Details Patient Name: Date of Service: Antonio Antonio [name]. 09/12/2021 9:00 [name] Medical Record Number: [id number] Patient Account Number: [id number] Date of Birth/Sex: Treating RN: 1945-11-17 (75 y.o. Antonio [name], Antonio.[name] Primary Care Layn Kye: Antonio [name] Other Clinician: Donavan [name] Referring Antonio [name]: Treating Antonio [name]/Extender: Antonio [name] in Treatment: 4 HBO Safety Checklist Items Safety Checklist Consent Form Signed Patient voided / foley secured and emptied When did you last eato 0745 Last dose of injectable or oral agent 0745 Ostomy pouch emptied and vented if applicable NA All implantable devices assessed, documented and approved NA Intravenous access site secured and place NA Valuables secured Linens and cotton and cotton/polyester blend (less than 51% polyester) Personal oil-based products / skin lotions / body lotions removed Wigs or hairpieces removed NA Smoking or tobacco materials removed NA Books / newspapers / magazines / loose paper removed Cologne, aftershave, perfume and deodorant removed Jewelry removed (may wrap wedding band) Make-up removed NA Hair care products removed Battery operated devices (external) removed Heating patches and chemical warmers removed Titanium eyewear removed NA Nail polish cured greater than 10 hours NA Casting material cured greater than 10 hours NA Hearing aids removed NA Loose dentures or partials removed dentures removed Prosthetics have been removed NA Patient demonstrates correct use of air break device (if applicable) Patient concerns have been addressed Patient grounding bracelet on and cord attached to chamber Specifics for Inpatients (complete in addition to above) Medication sheet sent with  patient NA Intravenous medications needed or due during therapy sent with patient NA Drainage tubes (e.g. nasogastric tube or chest tube secured and vented) NA Endotracheal or Tracheotomy tube secured NA Cuff deflated of air and inflated with saline NA Airway suctioned NA Notes Paper version used prior to  treatment Electronic Signature(s) Signed: 09/12/2021 12:45:55 PM By: Antonio [name] CHT EMT BS , , Entered By: Antonio [name] on 09/12/2021 12:45:55

## 2021-09-12 NOTE — Progress Notes (Addendum)
ALIAS, VILLAGRAN ([id number]) Visit Report for 09/12/2021 Arrival Information Details Patient Name: Date of Service: RONALDO, CRILLY [name] 09/12/2021 9:00 A M Medical Record Number: [id number] Patient Account Number: [id number] Date of Birth/Sex: Treating RN: May 26, 1946 (75 y.o. [name], [name] Primary Care Shacola Schussler: [name] Other Clinician: [name] Referring Aveya Beal: Treating Sharmila Wrobleski/Extender: [name] in Treatment: 4 Visit Information History Since Last Visit All ordered tests and consults were completed: Yes Patient Arrived: Ambulatory Added or deleted any medications: No Arrival Time: 09:21 Any new allergies or adverse reactions: No Accompanied By: self Had a fall or experienced change in No Transfer Assistance: None activities of daily living that may affect Patient Identification Verified: Yes risk of falls: Secondary Verification Process Completed: Yes Signs or symptoms of abuse/neglect since last visito No Patient Requires Transmission-Based Precautions: No Hospitalized since last visit: No Patient Has Alerts: No Implantable device outside of the clinic excluding No cellular tissue based products placed in the center since last visit: Pain Present Now: No Electronic Signature(s) Signed: 09/12/2021 12:43:38 PM By: [name] CHT EMT BS , , Entered By: [name] on 09/12/2021 12:43:38 -------------------------------------------------------------------------------- Encounter Discharge Information Details Patient Name: Date of Service: [name] 09/12/2021 9:00 A M Medical Record Number: [id number] Patient Account Number: [id number] Date of Birth/Sex: Treating RN: 1946-08-26 (75 y.o. [name] Primary Care Cieanna Stormes: [name] Other Clinician: [name] Referring Aldrich Lloyd: Treating Wayde Gopaul/Extender: [name] in Treatment: 4 Encounter  Discharge Information Items Discharge Condition: Stable Ambulatory Status: Ambulatory Discharge Destination: Home Transportation: Private Auto Accompanied By: self Schedule Follow-up Appointment: No Clinical Summary of Care: Electronic Signature(s) Signed: 09/12/2021 1:04:27 PM By: [name] CHT EMT BS , , Entered By: [name] on 09/12/2021 13:04:27 -------------------------------------------------------------------------------- Vitals Details Patient Name: Date of Service: [name] 09/12/2021 9:00 A M Medical Record Number: [id number] Patient Account Number: [id number] Date of Birth/Sex: Treating RN: [birth year] (75 y.o. [name], [name] Primary Care Willie Loy: [name] Other Clinician: [name] Referring Ilyanna Baillargeon: Treating Gillian Meeuwsen/Extender: [name] in Treatment: 4 Vital Signs Time Taken: 09:23 Temperature (F): 98.4 Height (in): 67 Pulse (bpm): 72 Weight (lbs): 170 Respiratory Rate (breaths/min): 16 Body Mass Index (BMI): 26.6 Blood Pressure (mmHg): 135/67 Capillary Blood Glucose (mg/dl): 135 Reference Range: 80 - 120 mg / dl Electronic Signature(s) Signed: 09/12/2021 12:44:22 PM By: [name] CHT EMT BS , , Entered By: [name] on 09/12/2021 12:44:22

## 2021-09-12 NOTE — Progress Notes (Signed)
Antonio [name], Antonio [name] ([id number]) Visit Report for 09/12/2021 SuperBill Details Patient Name: Date of Service: Antonio [name], Antonio [name] 09/12/2021 Medical Record Number: [id number] Patient Account Number: [id number] Date of Birth/Sex: Treating RN: 16-Sep-1946 (75 y.o. 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

## 2021-09-12 NOTE — Progress Notes (Addendum)
ERLE, GUSTER ([id number]) Visit Report for 09/11/2021 Arrival Information Details Patient Name: Date of Service: Antonio [name], Antonio [name] [name] 09/11/2021 9:00 A M Medical Record Number: [id number] Patient Account Number: [id number] Date of Birth/Sex: Treating RN: May 26, 1946 (75 y.o. [name], [name] Primary Care Ozella Comins: [name] Other Clinician: [name] Referring Ashwath Lasch: Treating Fenna Semel/Extender: [name] in Treatment: 4 Visit Information History Since Last Visit All ordered tests and consults were completed: Yes Patient Arrived: Ambulatory Added or deleted any medications: No Arrival Time: 12:54 Any new allergies or adverse reactions: No Accompanied By: self Had a fall or experienced change in No Transfer Assistance: None activities of daily living that may affect Patient Identification Verified: Yes risk of falls: Secondary Verification Process Completed: Yes Signs or symptoms of abuse/neglect since last visito No Patient Requires Transmission-Based Precautions: No Hospitalized since last visit: No Patient Has Alerts: No Implantable device outside of the clinic excluding No cellular tissue based products placed in the center since last visit: Pain Present Now: No Electronic Signature(s) Signed: 09/11/2021 2:27:20 PM By: [name] CHT EMT BS , , Entered By: [name] on 09/11/2021 14:27:20 -------------------------------------------------------------------------------- Encounter Discharge Information Details Patient Name: Date of Service: Antonio [place] MA S A. 09/11/2021 9:00 A M Medical Record Number: [id number] Patient Account Number: [id number] Date of Birth/Sex: Treating RN: 08-26-1946 (75 y.o. [name] Primary Care Deiondre Harrower: [name] Other Clinician: [name] Referring Naquan Garman: Treating Kairo Laubacher/Extender: [name] in Treatment: 4 Encounter  Discharge Information Items Discharge Condition: Stable Ambulatory Status: Ambulatory Discharge Destination: Home Transportation: Private Auto Accompanied By: self Schedule Follow-up Appointment: No Clinical Summary of Care: Electronic Signature(s) Signed: 09/11/2021 2:36:42 PM By: [name] CHT EMT BS , , Entered By: [name] on 09/11/2021 14:36:41 -------------------------------------------------------------------------------- Vitals Details Patient Name: Date of Service: Antonio [place] MA S A. 09/11/2021 9:00 A M Medical Record Number: [id number] Patient Account Number: [id number] Date of Birth/Sex: Treating RN: 07/31/1946 (75 y.o. [name] Primary Care Faun Mcqueen: [name] Other Clinician: [name] Referring Burle Kwan: Treating Nicosha Struve/Extender: [name] in Treatment: 4 Vital Signs Time Taken: 09:11 Temperature (F): 98.2 Height (in): 67 Pulse (bpm): 81 Weight (lbs): 170 Respiratory Rate (breaths/min): 18 Body Mass Index (BMI): 26.6 Blood Pressure (mmHg): 135/75 Capillary Blood Glucose (mg/dl): 166 Reference Range: 80 - 120 mg / dl Airway Pulse Oximetry (%): 100 Electronic Signature(s) Signed: 09/11/2021 2:28:55 PM By: [name] CHT EMT BS , , Entered By: [name] on 09/11/2021 14:28:54

## 2021-09-13 ENCOUNTER — Encounter (HOSPITAL_BASED_OUTPATIENT_CLINIC_OR_DEPARTMENT_OTHER): Payer: Medicare Other | Attending: Internal Medicine | Admitting: Internal Medicine

## 2021-09-13 DIAGNOSIS — N3041 Irradiation cystitis with hematuria: Secondary | ICD-10-CM | POA: Diagnosis not present

## 2021-09-13 DIAGNOSIS — R350 Frequency of micturition: Secondary | ICD-10-CM | POA: Diagnosis not present

## 2021-09-13 DIAGNOSIS — Y842 Radiological procedure and radiotherapy as the cause of abnormal reaction of the patient, or of later complication, without mention of misadventure at the time of the procedure: Secondary | ICD-10-CM | POA: Insufficient documentation

## 2021-09-13 DIAGNOSIS — Z8546 Personal history of malignant neoplasm of prostate: Secondary | ICD-10-CM | POA: Insufficient documentation

## 2021-09-13 LAB — GLUCOSE, CAPILLARY
Glucose-Capillary: 152 mg/dL — ABNORMAL HIGH (ref 70–99)
Glucose-Capillary: 138 mg/dL — ABNORMAL HIGH (ref 70–99)

## 2021-09-13 NOTE — Progress Notes (Addendum)
ARMSTRONG, CREASY ([id number]) Visit Report for 09/13/2021 Arrival Information Details Patient Name: Date of Service: Antonio [name], Antonio [name] Antonio A. 09/13/2021 9:00 A M Medical Record Number: [id number] Patient Account Number: [id number] Date of Birth/Sex: Treating RN: 08-Jan-1946 (75 y.o. Antonio [name], Antonio [name] Primary Care Antonio [name]: Antonio [name] Other Clinician: [name] Referring Antonio [name]: Treating Antonio [name]/Extender: Antonio [name] in Treatment: 5 Visit Information History Since Last Visit All ordered tests and consults were completed: Yes Patient Arrived: Ambulatory Added or deleted any medications: No Arrival Time: 09:05 Any new allergies or adverse reactions: No Accompanied By: None Had a fall or experienced change in No Transfer Assistance: None activities of daily living that may affect Patient Identification Verified: Yes risk of falls: Secondary Verification Process Completed: Yes Signs or symptoms of abuse/neglect since last visito No Patient Requires Transmission-Based Precautions: No Hospitalized since last visit: No Patient Has Alerts: No Implantable device outside of the clinic excluding No cellular tissue based products placed in the center since last visit: Pain Present Now: No Electronic Signature(s) Signed: 09/13/2021 11:53:26 AM By: [name] CHT EMT BS , , Previous Signature: 09/13/2021 10:35:24 AM Version By: [name] EMT Entered By: [name] on 09/13/2021 11:53:26 -------------------------------------------------------------------------------- Encounter Discharge Information Details Patient Name: Date of Service: Antonio [place] MA S A. 09/13/2021 9:00 A M Medical Record Number: [id number] Patient Account Number: [id number] Date of Birth/Sex: Treating RN: 1946/02/27 (75 y.o. Antonio [name] Primary Care Antonio [name]: Antonio [name] Other Clinician: [name] Referring Araiyah Cumpton: Treating Antonio [name]/Extender:  Antonio [name] in Treatment: 5 Encounter Discharge Information Items Discharge Condition: Stable Ambulatory Status: Ambulatory Discharge Destination: Home Transportation: Private Auto Accompanied By: self Schedule Follow-up Appointment: No Clinical Summary of Care: Electronic Signature(s) Signed: 09/13/2021 1:23:10 PM By: [name] CHT EMT BS , , Entered By: [name] on 09/13/2021 13:23:10 -------------------------------------------------------------------------------- Vitals Details Patient Name: Date of Service: Antonio [place] MA S A. 09/13/2021 9:00 A M Medical Record Number: [id number] Patient Account Number: [id number] Date of Birth/Sex: Treating RN: [birth year] (75 y.o. Antonio [name], Antonio [name] Primary Care Antonio [name]: Antonio [name] Other Clinician: [name] Referring Antonio [name]: Treating Antonio [name]/Extender: Antonio [name] in Treatment: 5 Vital Signs Time Taken: 09:09 Temperature (F): 97.7 Height (in): 67 Pulse (bpm): 68 Weight (lbs): 170 Respiratory Rate (breaths/min): 16 Body Mass Index (BMI): 26.6 Blood Pressure (mmHg): 135/81 Capillary Blood Glucose (mg/dl): 152 Reference Range: 80 - 120 mg / dl Electronic Signature(s) Signed: 09/13/2021 11:55:26 AM By: [name] CHT EMT BS , , Previous Signature: 09/13/2021 10:36:38 AM Version By: [name] EMT Entered By: [name] on 09/13/2021 11:55:25

## 2021-09-13 NOTE — Progress Notes (Signed)
Antonio [name], Antonio [name] ([id number]) Visit Report for 09/13/2021 SuperBill Details Patient Name: Date of Service: Antonio [name], Antonio [name] [place] 09/13/2021 Medical Record Number: [id number] Patient Account Number: [id number] Date of Birth/Sex: Treating RN: [birth year] (75 y.o. 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

## 2021-09-13 NOTE — Progress Notes (Addendum)
Antonio [name] ([id number]) Visit Report for 09/13/2021 HBO Details Patient Name: Date of Service: Antonio [name] [name] 09/13/2021 9:00 A M Medical Record Number: [id number] Patient Account Number: [id number] Date of Birth/Sex: Treating RN: Dec 06, 1945 (75 y.o. Antonio [name], [name] Primary Care [name]: [name] Other Clinician: [name] Referring [name]: Treating Dorell Gatlin/Extender: [name] in Treatment: 5 HBO Treatment Course Details Treatment Course Number: 1 Ordering Jayvon Mounger: Bernerd Pho Treatments Ordered: otal 40 HBO Treatment Start Date: 08/23/2021 HBO Indication: Late Effect of Radiation HBO Treatment Details Treatment Number: 14 Patient Type: Outpatient Chamber Type: Monoplace Chamber Serial #: [id number] Treatment Protocol: 2.0 ATA with 90 minutes oxygen, and no air breaks Treatment Details Compression Rate Down: 2.0 psi / minute De-Compression Rate Up: 2.0 psi / minute Air breaks and breathing Decompress Decompress Compress Tx Pressure Begins Reached periods Begins Ends (leave unused spaces blank) Chamber Pressure (ATA 1 2 ------2 1 ) Clock Time (24 hr) 09:24 09:32 - - - - - - 11:02 11:10 Treatment Length: 106 (minutes) Treatment Segments: 4 Vital Signs Capillary Blood Glucose Reference Range: 80 - 120 mg / dl HBO Diabetic Blood Glucose Intervention Range: <131 mg/dl or >249 mg/dl Time Vitals Blood Respiratory Capillary Blood Glucose Pulse Action Type: Pulse: Temperature: Taken: Pressure: Rate: Glucose (mg/dl): Meter #: Oximetry (%) Taken: Pre 09:09 135/81 68 16 97.7 152 Post 11:21 141/73 65 16 98.5 138 Treatment Response Treatment Toleration: Well Treatment Completion Status: Treatment Completed without Adverse Event Leta Bucklin Notes No concerns with treatment given Physician HBO Attestation: I certify that I supervised this HBO treatment in accordance with Medicare guidelines. A trained emergency  response team is readily available per Yes hospital policies and procedures. Continue HBOT as ordered. Yes Electronic Signature(s) Signed: 09/13/2021 4:34:24 PM By: [name] MD Previous Signature: 09/13/2021 11:37:50 AM Version By: [name] CHT EMT BS , , Entered By: [name] on 09/13/2021 16:30:17 -------------------------------------------------------------------------------- HBO Safety Checklist Details Patient Name: Date of Service: Antonio [place] Antonio S A. 09/13/2021 9:00 A M Medical Record Number: [id number] Patient Account Number: [id number] Date of Birth/Sex: Treating RN: 08-09-1946 (75 y.o. M) Primary Care [name]: [name] Other Clinician: Referring [name]: Treating [name]/Extender: [name] in Treatment: 5 HBO Safety Checklist Items Safety Checklist Consent Form Signed Patient voided / foley secured and emptied When did you last eato 0745 Last dose of injectable or oral agent 0745 Ostomy pouch emptied and vented if applicable NA All implantable devices assessed, documented and approved NA Intravenous access site secured and place NA Valuables secured Linens and cotton and cotton/polyester blend (less than 51% polyester) Personal oil-based products / skin lotions / body lotions removed Wigs or hairpieces removed Smoking or tobacco materials removed Books / newspapers / magazines / loose paper removed Cologne, aftershave, perfume and deodorant removed Jewelry removed (may wrap wedding band) Make-up removed Hair care products removed Battery operated devices (external) removed Heating patches and chemical warmers removed Titanium eyewear removed NA Nail polish cured greater than 10 hours NA Casting material cured greater than 10 hours NA Hearing aids removed NA Loose dentures or partials removed NA Prosthetics have been removed NA Patient demonstrates correct use of air break device (if  applicable) Patient concerns have been addressed Patient grounding bracelet on and cord attached to chamber Specifics for Inpatients (complete in addition to above) Medication sheet sent with patient NA Intravenous medications needed or due during therapy sent with patient NA Drainage tubes (e.g. nasogastric tube or chest tube  secured and vented) NA Endotracheal or Tracheotomy tube secured NA Cuff deflated of air and inflated with saline NA Airway suctioned NA Notes Paper intake used on arrival I certify that I directed and performed the safety check for this treatment. [name] Electronic Signature(s) Signed: 09/13/2021 11:36:05 AM By: [name] CHT EMT BS , , Previous Signature: 09/13/2021 10:38:54 AM Version By: [name] EMT Entered By: [name] on 09/13/2021 11:36:05

## 2021-09-14 ENCOUNTER — Other Ambulatory Visit: Payer: Self-pay

## 2021-09-14 ENCOUNTER — Encounter (HOSPITAL_BASED_OUTPATIENT_CLINIC_OR_DEPARTMENT_OTHER): Payer: Medicare Other | Admitting: Internal Medicine

## 2021-09-14 DIAGNOSIS — R350 Frequency of micturition: Secondary | ICD-10-CM

## 2021-09-14 DIAGNOSIS — N3041 Irradiation cystitis with hematuria: Secondary | ICD-10-CM | POA: Diagnosis not present

## 2021-09-14 DIAGNOSIS — Z8546 Personal history of malignant neoplasm of prostate: Secondary | ICD-10-CM

## 2021-09-14 LAB — GLUCOSE, CAPILLARY
Glucose-Capillary: 229 mg/dL — ABNORMAL HIGH (ref 70–99)
Glucose-Capillary: 121 mg/dL — ABNORMAL HIGH (ref 70–99)

## 2021-09-14 NOTE — Progress Notes (Signed)
Antonio [name], Antonio [name] ([id number]) Visit Report for 09/14/2021 SuperBill Details Patient Name: Date of Service: Antonio [name], Antonio [name] [place] 09/14/2021 Medical Record Number: [id number] Patient Account Number: [id number] Date of Birth/Sex: Treating RN: [birth year] (75 y.o. 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

## 2021-09-14 NOTE — Progress Notes (Addendum)
[name], PETROPOULOS ([id number]) Visit Report for 09/14/2021 HBO Details Patient Name: Date of Service: Antonio [name], Antonio [name] [name] 09/14/2021 8:00 A M Medical Record Number: [id number] Patient Account Number: [id number] Date of Birth/Sex: Treating RN: Jul 29, 1946 (75 y.o. Antonio [name], Antonio [name] Primary Care Alfons Sulkowski: [name] Other Clinician: [name] Referring Chattie Greeson: Treating Shaunessy Dobratz/Extender: Krista Blue in Treatment: 5 HBO Treatment Course Details Treatment Course Number: 1 Ordering Briell Paulette: Bernerd Pho Treatments Ordered: otal 40 HBO Treatment Start Date: 08/23/2021 HBO Indication: Late Effect of Radiation HBO Treatment Details Treatment Number: 15 Patient Type: Outpatient Chamber Type: Monoplace Chamber Serial #: [id number] Treatment Protocol: 2.0 ATA with 90 minutes oxygen, and no air breaks Treatment Details Compression Rate Down: 2.0 psi / minute De-Compression Rate Up: 2.0 psi / minute Air breaks and breathing Decompress Decompress Compress Tx Pressure Begins Reached periods Begins Ends (leave unused spaces blank) Chamber Pressure (ATA 1 2 ------2 1 ) Clock Time (24 hr) 08:25 08:31 - - - - - - 10:02 10:11 Treatment Length: 106 (minutes) Treatment Segments: 4 Vital Signs Capillary Blood Glucose Reference Range: 80 - 120 mg / dl HBO Diabetic Blood Glucose Intervention Range: <131 mg/dl or >249 mg/dl Type: Time Vitals Blood Pulse: Respiratory Temperature: Capillary Blood Glucose Pulse Action Taken: Pressure: Rate: Glucose (mg/dl): Meter #: Oximetry (%) Taken: Pre 07:54 125/64 69 16 98 229 Post 10:16 134/75 66 16 98.9 121 discharge per protocol >101 mg/dL Treatment Response Treatment Toleration: Well Treatment Completion Status: Treatment Completed without Adverse Event Physician HBO Attestation: I certify that I supervised this HBO treatment in accordance with Medicare guidelines. A trained emergency response team is  readily available per Yes hospital policies and procedures. Continue HBOT as ordered. Yes Electronic Signature(s) Signed: 09/14/2021 12:26:10 PM By: Kalman Shan DO Previous Signature: 09/14/2021 11:20:34 AM Version By: [name] CHT EMT BS , , Entered By: Kalman Shan on 09/14/2021 12:25:52 -------------------------------------------------------------------------------- HBO Safety Checklist Details Patient Name: Date of Service: Antonio [place] MA S A. 09/14/2021 8:00 A M Medical Record Number: [id number] Patient Account Number: [id number] Date of Birth/Sex: Treating RN: 1946/02/21 (75 y.o. Antonio [name], Antonio [name] Primary Care Jerod Mcquain: [name] Other Clinician: [name] Referring Ilani Otterson: Treating Delayza Lungren/Extender: Krista Blue in Treatment: 5 HBO Safety Checklist Items Safety Checklist Consent Form Signed Patient voided / foley secured and emptied When did you last eato 0730 Last dose of injectable or oral agent 0730 Ostomy pouch emptied and vented if applicable NA All implantable devices assessed, documented and approved NA Intravenous access site secured and place NA Valuables secured Linens and cotton and cotton/polyester blend (less than 51% polyester) Personal oil-based products / skin lotions / body lotions removed Wigs or hairpieces removed NA Smoking or tobacco materials removed Books / newspapers / magazines / loose paper removed Cologne, aftershave, perfume and deodorant removed Jewelry removed (may wrap wedding band) Make-up removed NA Hair care products removed Battery operated devices (external) removed Heating patches and chemical warmers removed Titanium eyewear removed Nail polish cured greater than 10 hours NA Casting material cured greater than 10 hours NA Hearing aids removed NA Loose dentures or partials removed NA Prosthetics have been removed NA Patient demonstrates correct use of air  break device (if applicable) Patient concerns have been addressed Patient grounding bracelet on and cord attached to chamber Specifics for Inpatients (complete in addition to above) Medication sheet sent with patient NA Intravenous medications needed or due during therapy sent with patient NA Drainage tubes (e.g. nasogastric tube  or chest tube secured and vented) NA Endotracheal or Tracheotomy tube secured NA Cuff deflated of air and inflated with saline NA Airway suctioned NA Notes Paper version used prior to treatment. I certify I directed and performed the safety check for this treatment. [name] Electronic Signature(s) Signed: 09/14/2021 11:12:35 AM By: [name] CHT EMT BS , , Previous Signature: 09/14/2021 11:12:06 AM Version By: [name] CHT EMT BS , , Previous Signature: 09/14/2021 8:41:42 AM Version By: [name] EMT Entered By: [name] on 09/14/2021 11:12:35

## 2021-09-15 NOTE — Progress Notes (Addendum)
Antonio [name], Antonio [name] ([id number]) Visit Report for 09/14/2021 Arrival Information Details Patient Name: Date of Service: Antonio [name], Antonio [name] [place] Antonio [name]. 09/14/2021 8:00 Antonio [name] Medical Record Number: [id number] Patient Account Number: [id number] Date of Birth/Sex: Treating RN: [birth year] (75 y.o. Antonio [name], [name] Primary Care Areen Trautner: [name] Other Clinician: [name] Referring Mase Dhondt: Treating Abeer Deskins/Extender: Krista Blue in Treatment: 5 Visit Information History Since Last Visit All ordered tests and consults were completed: Yes Patient Arrived: Ambulatory Added or deleted any medications: No Arrival Time: 07:49 Any new allergies or adverse reactions: No Accompanied By: None Had Antonio [name] fall or experienced change in No Transfer Assistance: None activities of daily living that may affect Patient Identification Verified: Yes risk of falls: Secondary Verification Process Completed: Yes Signs or symptoms of abuse/neglect since last visito No Patient Requires Transmission-Based Precautions: No Hospitalized since last visit: No Patient Has Alerts: No Implantable device outside of the clinic excluding No cellular tissue based products placed in the center since last visit: Pain Present Now: No Electronic Signature(Antonio [name]) Signed: 09/14/2021 8:39:24 AM By: [name] EMT Entered By: [name] on 09/14/2021 08:39:24 -------------------------------------------------------------------------------- Encounter Discharge Information Details Patient Name: Date of Service: Antonio Baltimore MA Antonio [name] Antonio [name]. 09/14/2021 8:00 Antonio [name] Medical Record Number: [id number] Patient Account Number: [id number] Date of Birth/Sex: Treating RN: 07-04-1946 (75 y.o. Antonio [name] Primary Care [name] [name]: [name] Other Clinician: [name] Referring Kollyn Lingafelter: Treating Nahiara Kretzschmar/Extender: Krista Blue in Treatment: 5 Encounter Discharge Information  Items Discharge Condition: Stable Ambulatory Status: Ambulatory Discharge Destination: Home Transportation: Private Auto Accompanied By: self Schedule Follow-up Appointment: No Clinical Summary of Care: Electronic Signature(Antonio [name]) Signed: 09/14/2021 11:22:00 AM By: [name] CHT EMT BS , , Entered By: [name] on 09/14/2021 11:22:00 -------------------------------------------------------------------------------- Vitals Details Patient Name: Date of Service: Antonio Baltimore MA Antonio [name] Antonio [name]. 09/14/2021 8:00 Antonio [name] Medical Record Number: [id number] Patient Account Number: [id number] Date of Birth/Sex: Treating RN: Feb 20, 1946 (75 y.o. Antonio [name], [name] Primary Care Hayde Kilgour: [name] Other Clinician: [name] Referring Elodia Haviland: Treating Ladon Vandenberghe/Extender: Krista Blue in Treatment: 5 Vital Signs Time Taken: 07:54 Temperature (F): 98.0 Height (in): 67 Pulse (bpm): 69 Weight (lbs): 170 Respiratory Rate (breaths/min): 16 Body Mass Index (BMI): 26.6 Blood Pressure (mmHg): 125/64 Capillary Blood Glucose (mg/dl): 229 Reference Range: 80 - 120 mg / dl Electronic Signature(Antonio [name]) Signed: 09/14/2021 8:40:20 AM By: [name] EMT Entered By: [name] on 09/14/2021 08:40:19

## 2021-09-17 ENCOUNTER — Encounter (HOSPITAL_BASED_OUTPATIENT_CLINIC_OR_DEPARTMENT_OTHER): Payer: Medicare Other | Admitting: Internal Medicine

## 2021-09-17 ENCOUNTER — Other Ambulatory Visit: Payer: Self-pay

## 2021-09-17 DIAGNOSIS — N3041 Irradiation cystitis with hematuria: Secondary | ICD-10-CM | POA: Diagnosis not present

## 2021-09-17 LAB — GLUCOSE, CAPILLARY: Glucose-Capillary: 149 mg/dL — ABNORMAL HIGH (ref 70–99)

## 2021-09-17 NOTE — Progress Notes (Addendum)
Antonio [name], Antonio [name] ([id number]) Visit Report for 09/17/2021 Arrival Information Details Patient Name: Date of Service: Antonio [name], Antonio [name] [name] 09/17/2021 8:00 A M Medical Record Number: [id number] Patient Account Number: [id number] Date of Birth/Sex: Treating RN: [birth year] (75 y.o. M) Primary Care Taheera Thomann: [name] Other Clinician: [name] Referring May Manrique: Treating Rolena Knutson/Extender: Krista Blue in Treatment: 5 Visit Information History Since Last Visit All ordered tests and consults were completed: Yes Patient Arrived: Ambulatory Added or deleted any medications: No Arrival Time: 07:45 Any new allergies or adverse reactions: No Accompanied By: self Had a fall or experienced change in No Transfer Assistance: None activities of daily living that may affect Patient Identification Verified: Yes risk of falls: Secondary Verification Process Completed: Yes Signs or symptoms of abuse/neglect since last visito No Patient Requires Transmission-Based Precautions: No Hospitalized since last visit: No Patient Has Alerts: No Implantable device outside of the clinic excluding No cellular tissue based products placed in the center since last visit: Pain Present Now: No Electronic Signature(s) Signed: 09/17/2021 11:58:25 AM By: [name] CHT EMT BS , , Entered By: [name] on 09/17/2021 11:58:25 -------------------------------------------------------------------------------- Clinic Level of Care Assessment Details Patient Name: Date of Service: Antonio [name], CRUM MA S A. 09/17/2021 8:00 A M Medical Record Number: [id number] Patient Account Number: [id number] Date of Birth/Sex: Treating RN: 12-04-1945 (74 y.o. [name] Primary Care Heylee Tant: [name] Other Clinician: [name] Referring Cordaryl Decelles: Treating Jiana Lemaire/Extender: Krista Blue in Treatment: 5 Clinic Level of Care  Assessment Items TOOL 4 Quantity Score X- 1 0 Use when only an EandM is performed on FOLLOW-UP visit ASSESSMENTS - Nursing Assessment / Reassessment X- 1 10 Reassessment of Co-morbidities (includes updates in patient status) X- 1 5 Reassessment of Adherence to Treatment Plan ASSESSMENTS - Wound and Skin A ssessment / Reassessment []  - 0 Simple Wound Assessment / Reassessment - one wound []  - 0 Complex Wound Assessment / Reassessment - multiple wounds []  - 0 Dermatologic / Skin Assessment (not related to wound area) ASSESSMENTS - Focused Assessment []  - 0 Circumferential Edema Measurements - multi extremities []  - 0 Nutritional Assessment / Counseling / Intervention []  - 0 Lower Extremity Assessment (monofilament, tuning fork, pulses) []  - 0 Peripheral Arterial Disease Assessment (using hand held doppler) ASSESSMENTS - Ostomy and/or Continence Assessment and Care []  - 0 Incontinence Assessment and Management []  - 0 Ostomy Care Assessment and Management (repouching, etc.) PROCESS - Coordination of Care X - Simple Patient / Family Education for ongoing care 1 15 []  - 0 Complex (extensive) Patient / Family Education for ongoing care []  - 0 Staff obtains Programmer, systems, Records, T Results / Process Orders est []  - 0 Staff telephones HHA, Nursing Homes / Clarify orders / etc []  - 0 Routine Transfer to another Facility (non-emergent condition) []  - 0 Routine Hospital Admission (non-emergent condition) []  - 0 New Admissions / Biomedical engineer / Ordering NPWT Apligraf, etc. , []  - 0 Emergency Hospital Admission (emergent condition) X- 1 10 Simple Discharge Coordination []  - 0 Complex (extensive) Discharge Coordination PROCESS - Special Needs []  - 0 Pediatric / Minor Patient Management []  - 0 Isolation Patient Management []  - 0 Hearing / Language / Visual special needs []  - 0 Assessment of Community assistance (transportation, D/C planning, etc.) []  -  0 Additional assistance / Altered mentation []  - 0 Support Surface(s) Assessment (bed, cushion, seat, etc.) INTERVENTIONS - Wound Cleansing / Measurement []  - 0 Simple Wound Cleansing - one wound []  -  0 Complex Wound Cleansing - multiple wounds []  - 0 Wound Imaging (photographs - any number of wounds) []  - 0 Wound Tracing (instead of photographs) []  - 0 Simple Wound Measurement - one wound []  - 0 Complex Wound Measurement - multiple wounds INTERVENTIONS - Wound Dressings []  - 0 Small Wound Dressing one or multiple wounds []  - 0 Medium Wound Dressing one or multiple wounds []  - 0 Large Wound Dressing one or multiple wounds []  - 0 Application of Medications - topical []  - 0 Application of Medications - injection INTERVENTIONS - Miscellaneous []  - 0 External ear exam []  - 0 Specimen Collection (cultures, biopsies, blood, body fluids, etc.) []  - 0 Specimen(s) / Culture(s) sent or taken to Lab for analysis []  - 0 Patient Transfer (multiple staff / Civil Service fast streamer / Similar devices) []  - 0 Simple Staple / Suture removal (25 or less) []  - 0 Complex Staple / Suture removal (26 or more) []  - 0 Hypo / Hyperglycemic Management (close monitor of Blood Glucose) []  - 0 Ankle / Brachial Index (ABI) - do not check if billed separately X- 1 5 Vital Signs Has the patient been seen at the hospital within the last three years: Yes Total Score: 45 Level Of Care: New/Established - Level 2 Electronic Signature(s) Signed: 09/17/2021 3:59:15 PM By: Levan Hurst RN, BSN Entered By: Levan Hurst on 09/17/2021 12:55:00 -------------------------------------------------------------------------------- Encounter Discharge Information Details Patient Name: Date of Service: Antonio Baltimore MA S A. 09/17/2021 8:00 A M Medical Record Number: [id number] Patient Account Number: [id number] Date of Birth/Sex: Treating RN: November 22, 1945 (75 y.o. [name] Primary Care Satchel Heidinger: [name] Other Clinician: [name] Referring Alitza Cowman: Treating Ladarious Kresse/Extender: Krista Blue in Treatment: 5 Encounter Discharge Information Items Discharge Condition: Stable Ambulatory Status: Ambulatory Discharge Destination: Home Transportation: Private Auto Accompanied By: alone Schedule Follow-up Appointment: Yes Clinical Summary of Care: Patient Declined Electronic Signature(s) Signed: 09/17/2021 3:59:15 PM By: Levan Hurst RN, BSN Entered By: Levan Hurst on 09/17/2021 12:55:17 -------------------------------------------------------------------------------- Vitals Details Patient Name: Date of Service: Antonio Baltimore MA S A. 09/17/2021 8:00 A M Medical Record Number: [id number] Patient Account Number: [id number] Date of Birth/Sex: Treating RN: 1946-04-04 (75 y.o. M) Primary Care Walta [place]: [name] Other Clinician: [name] Referring Nobuko Gsell: Treating Kylii Ennis/Extender: Krista Blue in Treatment: 5 Vital Signs Time Taken: 07:51 Capillary Blood Glucose (mg/dl): 149 Height (in): 67 Reference Range: 80 - 120 mg / dl Weight (lbs): 170 Body Mass Index (BMI): 26.6 Electronic Signature(s) Signed: 09/17/2021 11:58:48 AM By: [name] CHT EMT BS , , Entered By: [name] on 09/17/2021 11:58:48

## 2021-09-18 ENCOUNTER — Encounter (HOSPITAL_BASED_OUTPATIENT_CLINIC_OR_DEPARTMENT_OTHER): Payer: Medicare Other | Admitting: Internal Medicine

## 2021-09-18 DIAGNOSIS — N3041 Irradiation cystitis with hematuria: Secondary | ICD-10-CM | POA: Diagnosis not present

## 2021-09-18 LAB — GLUCOSE, CAPILLARY
Glucose-Capillary: 175 mg/dL — ABNORMAL HIGH (ref 70–99)
Glucose-Capillary: 112 mg/dL — ABNORMAL HIGH (ref 70–99)

## 2021-09-18 NOTE — Progress Notes (Addendum)
MAKAEL, STEIN ([id number]) Visit Report for 09/18/2021 HBO Details Patient Name: Date of Service: TRASON, SHIFFLET [name] 09/18/2021 9:00 A M Medical Record Number: [id number] Patient Account Number: [id number] Date of Birth/Sex: Treating RN: 1946/02/02 (75 y.o. [name] Primary Care Rockland Kotarski: [name] Other Clinician: [name] Referring Devera Englander: Treating Chief Walkup/Extender: [name] in Treatment: 5 HBO Treatment Course Details Treatment Course Number: 1 Ordering Luken Shadowens: Bernerd Pho Treatments Ordered: otal 40 HBO Treatment Start Date: 08/23/2021 HBO Indication: Late Effect of Radiation HBO Treatment Details Treatment Number: 16 Patient Type: Outpatient Chamber Type: Monoplace Chamber Serial #: [id number] Treatment Protocol: 2.0 ATA with 90 minutes oxygen, and no air breaks Treatment Details Compression Rate Down: 2.0 psi / minute De-Compression Rate Up: 2.0 psi / minute Air breaks and breathing Decompress Decompress Compress Tx Pressure Begins Reached periods Begins Ends (leave unused spaces blank) Chamber Pressure (ATA 1 2 ------2 1 ) Clock Time (24 hr) 08:22 08:31 - - - - - - 10:01 10:09 Treatment Length: 107 (minutes) Treatment Segments: 4 Vital Signs Capillary Blood Glucose Reference Range: 80 - 120 mg / dl HBO Diabetic Blood Glucose Intervention Range: <131 mg/dl or >249 mg/dl Type: Time Vitals Blood Pulse: Respiratory Temperature: Capillary Blood Glucose Pulse Action Taken: Pressure: Rate: Glucose (mg/dl): Meter #: Oximetry (%) Taken: Pre 07:53 136/75 75 18 98.6 175 Post 10:15 130/83 70 18 98.7 112 discharge per protocol >101 mg/dL Treatment Response Treatment Toleration: Well Treatment Completion Status: Treatment Completed without Adverse Event Josiephine Simao Notes No concerns with treatment given Physician HBO Attestation: I certify that I supervised this HBO treatment in accordance with  Medicare guidelines. A trained emergency response team is readily available per Yes hospital policies and procedures. Continue HBOT as ordered. Yes Electronic Signature(s) Signed: 09/18/2021 4:21:48 PM By: [name] MD Previous Signature: 09/18/2021 12:57:32 PM Version By: [name] CHT EMT BS , , Entered By: [name] on 09/18/2021 15:52:01 -------------------------------------------------------------------------------- HBO Safety Checklist Details Patient Name: Date of Service: [name] 09/18/2021 9:00 A M Medical Record Number: [id number] Patient Account Number: [id number] Date of Birth/Sex: Treating RN: Aug 30, 1946 (75 y.o. [name] Primary Care Vernisha Bacote: [name] Other Clinician: [name] Referring Amaiya Scruton: Treating Michaline Kindig/Extender: [name] in Treatment: 5 HBO Safety Checklist Items Safety Checklist Consent Form Signed Patient voided / foley secured and emptied When did you last eato 0715 Last dose of injectable or oral agent 0715 Ostomy pouch emptied and vented if applicable NA All implantable devices assessed, documented and approved NA Intravenous access site secured and place NA Valuables secured Linens and cotton and cotton/polyester blend (less than 51% polyester) Personal oil-based products / skin lotions / body lotions removed Wigs or hairpieces removed NA Smoking or tobacco materials removed NA Books / newspapers / magazines / loose paper removed Cologne, aftershave, perfume and deodorant removed Jewelry removed (may wrap wedding band) Make-up removed NA Hair care products removed NA Battery operated devices (external) removed Heating patches and chemical warmers removed Titanium eyewear removed NA Nail polish cured greater than 10 hours NA Casting material cured greater than 10 hours NA Hearing aids removed NA Loose dentures or partials removed dentures  removed Prosthetics have been removed NA Patient demonstrates correct use of air break device (if applicable) Patient concerns have been addressed Patient grounding bracelet on and cord attached to chamber Specifics for Inpatients (complete in addition to above) Medication sheet sent with patient NA Intravenous medications needed or due  during therapy sent with patient NA Drainage tubes (e.g. nasogastric tube or chest tube secured and vented) NA Endotracheal or Tracheotomy tube secured NA Cuff deflated of air and inflated with saline NA Airway suctioned NA Electronic Signature(s) Signed: 09/18/2021 12:53:04 PM By: [name] CHT EMT BS , , Entered By: [name] on 09/18/2021 12:53:04

## 2021-09-18 NOTE — Progress Notes (Signed)
Antonio [name], Antonio [name] ([id number]) Visit Report for 09/18/2021 SuperBill Details Patient Name: Date of Service: Antonio [name], Antonio [name] [place] 09/18/2021 Medical Record Number: [id number] Patient Account Number: [id number] Date of Birth/Sex: Treating RN: 1945/10/20 (75 y.o. 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

## 2021-09-18 NOTE — Progress Notes (Signed)
JONATHAN, CORPUS ([id number]) Visit Report for 09/17/2021 SuperBill Details Patient Name: Date of Service: MAXIMINO, COZZOLINO [place] 09/17/2021 Medical Record Number: [id number] Patient Account Number: [id number] Date of Birth/Sex: Treating RN: 04/23/1946 (75 y.o. 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

## 2021-09-18 NOTE — Progress Notes (Addendum)
Antonio [name], Antonio [name] ([id number]) Visit Report for 09/18/2021 Arrival Information Details Patient Name: Date of Service: Antonio [name], Antonio [name] [name] 09/18/2021 9:00 A M Medical Record Number: [id number] Patient Account Number: [id number] Date of Birth/Sex: Treating RN: 1946-02-05 (75 y.o. [name], [name] Primary Care Mabelle Mungin: [name] Other Clinician: [name] Referring Yannis Gumbs: Treating Heer Justiss/Extender: [name] in Treatment: 5 Visit Information History Since Last Visit All ordered tests and consults were completed: Yes Patient Arrived: Ambulatory Added or deleted any medications: No Arrival Time: 07:39 Any new allergies or adverse reactions: No Accompanied By: self Had a fall or experienced change in No Transfer Assistance: None activities of daily living that may affect Patient Identification Verified: Yes risk of falls: Secondary Verification Process Completed: Yes Signs or symptoms of abuse/neglect since last visito No Patient Requires Transmission-Based Precautions: No Hospitalized since last visit: No Patient Has Alerts: No Implantable device outside of the clinic excluding No cellular tissue based products placed in the center since last visit: Pain Present Now: No Electronic Signature(s) Signed: 09/18/2021 12:24:48 PM By: [name] CHT EMT BS , , Entered By: [name] on 09/18/2021 12:24:48 -------------------------------------------------------------------------------- Encounter Discharge Information Details Patient Name: Date of Service: Antonio [place] MA S A. 09/18/2021 9:00 A M Medical Record Number: [id number] Patient Account Number: [id number] Date of Birth/Sex: Treating RN: [birth year] (75 y.o. [name] Primary Care Evamarie Raetz: [name] Other Clinician: [name] Referring Caci Orren: Treating Darnice Comrie/Extender: [name] in Treatment: 5 Encounter  Discharge Information Items Discharge Condition: Stable Ambulatory Status: Ambulatory Discharge Destination: Home Transportation: Private Auto Accompanied By: self Schedule Follow-up Appointment: No Clinical Summary of Care: Electronic Signature(s) Signed: 09/18/2021 12:58:23 PM By: [name] CHT EMT BS , , Entered By: [name] on 09/18/2021 12:58:22 -------------------------------------------------------------------------------- Vitals Details Patient Name: Date of Service: Antonio Baltimore MA S A. 09/18/2021 9:00 A M Medical Record Number: [id number] Patient Account Number: [id number] Date of Birth/Sex: Treating RN: 04-Mar-1946 (75 y.o. [name] Primary Care Yvonne Stopher: [name] Other Clinician: [name] Referring Tirrell Buchberger: Treating Cindel Daugherty/Extender: [name] in Treatment: 5 Vital Signs Time Taken: 07:53 Temperature (F): 98.6 Height (in): 67 Pulse (bpm): 75 Weight (lbs): 170 Respiratory Rate (breaths/min): 18 Body Mass Index (BMI): 26.6 Blood Pressure (mmHg): 136/75 Capillary Blood Glucose (mg/dl): 175 Reference Range: 80 - 120 mg / dl Electronic Signature(s) Signed: 09/18/2021 12:25:57 PM By: [name] CHT EMT BS , , Entered By: [name] on 09/18/2021 12:25:57

## 2021-09-19 ENCOUNTER — Encounter (HOSPITAL_BASED_OUTPATIENT_CLINIC_OR_DEPARTMENT_OTHER): Payer: Medicare Other | Admitting: Physician Assistant

## 2021-09-19 ENCOUNTER — Other Ambulatory Visit: Payer: Self-pay

## 2021-09-19 DIAGNOSIS — N3041 Irradiation cystitis with hematuria: Secondary | ICD-10-CM | POA: Diagnosis not present

## 2021-09-19 LAB — GLUCOSE, CAPILLARY
Glucose-Capillary: 297 mg/dL — ABNORMAL HIGH (ref 70–99)
Glucose-Capillary: 178 mg/dL — ABNORMAL HIGH (ref 70–99)

## 2021-09-19 NOTE — Progress Notes (Signed)
CINCERE, ZORN ([id number]) Visit Report for 09/19/2021 SuperBill Details Patient Name: Date of Service: Antonio [name], Antonio [name] [place] 09/19/2021 Medical Record Number: [id number] Patient Account Number: [id number] Date of Birth/Sex: Treating RN: 04-26-1946 (75 y.o. 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

## 2021-09-19 NOTE — Progress Notes (Addendum)
BROCKTON, MCKESSON ([id number]) Visit Report for 09/19/2021 HBO Details Patient Name: Date of Service: Antonio [name], Antonio [name] [name] 09/19/2021 9:00 A M Medical Record Number: [id number] Patient Account Number: [id number] Date of Birth/Sex: Treating RN: [birth year] (75 y.o. [name], [name] Primary Care Tannis Burstein: [name] Other Clinician: [name] Referring Zyara Riling: Treating Chasyn Cinque/Extender: [name] in Treatment: 5 HBO Treatment Course Details Treatment Course Number: 1 Ordering Dafney Farler: Bernerd Pho Treatments Ordered: otal 40 HBO Treatment Start Date: 08/23/2021 HBO Indication: Late Effect of Radiation HBO Treatment Details Treatment Number: 17 Patient Type: Outpatient Chamber Type: Monoplace Chamber Serial #: [id number] Treatment Protocol: 2.0 ATA with 90 minutes oxygen, and no air breaks Treatment Details Compression Rate Down: 2.0 psi / minute De-Compression Rate Up: 2.0 psi / minute Air breaks and breathing Decompress Decompress Compress Tx Pressure Begins Reached periods Begins Ends (leave unused spaces blank) Chamber Pressure (ATA 1 2 ------2 1 ) Clock Time (24 hr) 08:24 08:32 - - - - - - 10:02 10:10 Treatment Length: 106 (minutes) Treatment Segments: 4 Vital Signs Capillary Blood Glucose Reference Range: 80 - 120 mg / dl HBO Diabetic Blood Glucose Intervention Range: <131 mg/dl or >249 mg/dl Time Vitals Blood Respiratory Capillary Blood Glucose Pulse Action Type: Pulse: Temperature: Taken: Pressure: Rate: Glucose (mg/dl): Meter #: Oximetry (%) Taken: Pre 08:07 119/65 78 16 98.3 297 Post 10:14 126/73 72 16 98.7 178 Treatment Response Treatment Toleration: Well Treatment Completion Status: Treatment Completed without Adverse Event Electronic Signature(s) Signed: 09/19/2021 11:32:45 AM By: [name] CHT EMT BS , , Signed: 09/19/2021 4:51:36 PM By: [name] Entered By: [name] on  09/19/2021 11:32:44 -------------------------------------------------------------------------------- HBO Safety Checklist Details Patient Name: Date of Service: Antonio Baltimore MA S A. 09/19/2021 9:00 A M Medical Record Number: [id number] Patient Account Number: [id number] Date of Birth/Sex: Treating RN: 11-Dec-1945 (75 y.o. [name], [name] Primary Care [name]: [name] Other Clinician: [name] Referring [name]: Treating [name]/Extender: [name] in Treatment: 5 HBO Safety Checklist Items Safety Checklist Consent Form Signed Patient voided / foley secured and emptied When did you last eato 0730 Last dose of injectable or oral agent 0730 Ostomy pouch emptied and vented if applicable NA All implantable devices assessed, documented and approved NA Intravenous access site secured and place NA Valuables secured Linens and cotton and cotton/polyester blend (less than 51% polyester) Personal oil-based products / skin lotions / body lotions removed Wigs or hairpieces removed NA Smoking or tobacco materials removed NA Books / newspapers / magazines / loose paper removed Cologne, aftershave, perfume and deodorant removed Jewelry removed (may wrap wedding band) Make-up removed NA Hair care products removed Battery operated devices (external) removed Heating patches and chemical warmers removed Titanium eyewear removed NA Nail polish cured greater than 10 hours NA Casting material cured greater than 10 hours NA Hearing aids removed NA Loose dentures or partials removed dentures removed Prosthetics have been removed NA Patient demonstrates correct use of air break device (if applicable) Patient concerns have been addressed Patient grounding bracelet on and cord attached to chamber Specifics for Inpatients (complete in addition to above) Medication sheet sent with patient NA Intravenous medications needed or due during  therapy sent with patient NA Drainage tubes (e.g. nasogastric tube or chest tube secured and vented) NA Endotracheal or Tracheotomy tube secured NA Cuff deflated of air and inflated with saline NA Airway suctioned NA Electronic Signature(s) Signed: 09/19/2021 11:31:35 AM By: [name] CHT EMT  BS , , Entered By: [name] on 09/19/2021 11:31:35

## 2021-09-19 NOTE — Progress Notes (Signed)
Antonio [name], Antonio [name] ([id number]) Visit Report for 09/19/2021 Arrival Information Details Patient Name: Date of Service: Antonio [name], Antonio [name] [name] 09/19/2021 9:00 A M Medical Record Number: [id number] Patient Account Number: [id number] Date of Birth/Sex: Treating RN: [name] (75 y.o. Antonio [name], Antonio [name] Primary Care [name]: Antonio [name] Other Clinician: Donavan [name] Referring Antonio [name]: Treating Antonio [name]/Extender: Antonio [name] in Treatment: 5 Visit Information History Since Last Visit All ordered tests and consults were completed: Yes Patient Arrived: Ambulatory Added or deleted any medications: No Arrival Time: 08:01 Any new allergies or adverse reactions: No Accompanied By: self Had a fall or experienced change in No Transfer Assistance: None activities of daily living that Antonio affect Patient Identification Verified: Yes risk of falls: Secondary Verification Process Completed: Yes Signs or symptoms of abuse/neglect since last visito No Patient Requires Transmission-Based Precautions: No Hospitalized since last visit: No Patient Has Alerts: No Implantable device outside of the clinic excluding No cellular tissue based products placed in the center since last visit: Pain Present Now: No Electronic Signature(s) Signed: 09/19/2021 11:29:51 AM By: Antonio [name] CHT EMT BS , , Entered By: Antonio [name] on 09/19/2021 11:29:50 -------------------------------------------------------------------------------- Encounter Discharge Information Details Patient Name: Date of Service: Antonio Baltimore MA S A. 09/19/2021 9:00 A M Medical Record Number: [id number] Patient Account Number: [id number] Date of Birth/Sex: Treating RN: 10-14-1946 (75 y.o. Antonio [name] Primary Care Antonio [name]: Antonio [name] Other Clinician: Donavan [name] Referring Antonio [name]: Treating Antonio [name]: Antonio [name] in Treatment: 5 Encounter Discharge  Information Items Discharge Condition: Stable Ambulatory Status: Ambulatory Discharge Destination: Home Transportation: Private Auto Accompanied By: self Schedule Follow-up Appointment: No Clinical Summary of Care: Electronic Signature(s) Signed: 09/19/2021 11:33:53 AM By: Antonio [name] CHT EMT BS , , Entered By: Antonio [name] on 09/19/2021 11:33:53 -------------------------------------------------------------------------------- Vitals Details Patient Name: Date of Service: Antonio Baltimore MA S A. 09/19/2021 9:00 A M Medical Record Number: [id number] Patient Account Number: [id number] Date of Birth/Sex: Treating RN: Antonio [name] (75 y.o. Antonio [name], Antonio [name] Primary Care Antonio [name]: Antonio [name] Other Clinician: Donavan [name] Referring Antonio [name]: Treating Antonio [name]: Antonio [name] in Treatment: 5 Vital Signs Time Taken: 08:07 Temperature (F): 98.3 Height (in): 67 Pulse (bpm): 78 Weight (lbs): 170 Respiratory Rate (breaths/min): 16 Body Mass Index (BMI): 26.6 Blood Pressure (mmHg): 119/65 Capillary Blood Glucose (mg/dl): 297 Reference Range: 80 - 120 mg / dl Electronic Signature(s) Signed: 09/19/2021 11:30:24 AM By: Antonio [name] CHT EMT BS , , Entered By: Antonio [name] on 09/19/2021 11:30:24

## 2021-09-20 ENCOUNTER — Encounter (HOSPITAL_BASED_OUTPATIENT_CLINIC_OR_DEPARTMENT_OTHER): Payer: Medicare Other | Admitting: Internal Medicine

## 2021-09-20 DIAGNOSIS — N3041 Irradiation cystitis with hematuria: Secondary | ICD-10-CM | POA: Diagnosis not present

## 2021-09-20 LAB — GLUCOSE, CAPILLARY
Glucose-Capillary: 141 mg/dL — ABNORMAL HIGH (ref 70–99)
Glucose-Capillary: 115 mg/dL — ABNORMAL HIGH (ref 70–99)

## 2021-09-20 NOTE — Progress Notes (Signed)
Antonio [name], Antonio [name] ([id number]) Visit Report for 09/20/2021 Arrival Information Details Patient Name: Date of Service: Antonio [name] [name]. 09/20/2021 8:00 [name] Medical Record Number: [id number] Patient Account Number: [id number] Date of Birth/Sex: Treating RN: [birth year] (75 y.o. Antonio [name], [name] Primary Care Sylvester Salonga: [name] Other Clinician: Referring Tali Cleaves: Treating Theophile Harvie/Extender: [name] in Treatment: 6 Visit Information History Since Last Visit Added or deleted any medications: No Patient Arrived: Ambulatory Any new allergies or adverse reactions: No Arrival Time: 08:12 Had [name] fall or experienced change in No Accompanied By: self activities of daily living that may affect Transfer Assistance: None risk of falls: Patient Identification Verified: Yes Signs or symptoms of abuse/neglect since last visito No Secondary Verification Process Completed: Yes Hospitalized since last visit: No Patient Requires Transmission-Based Precautions: No Implantable device outside of the clinic excluding No Patient Has Alerts: No cellular tissue based products placed in the center since last visit: Pain Present Now: No Electronic Signature(s) Signed: 09/20/2021 3:55:54 PM By: Deon Pilling RN, BSN Entered By: Deon Pilling on 09/20/2021 08:12:21 -------------------------------------------------------------------------------- Clinic Level of Care Assessment Details Patient Name: Date of Service: Antonio [name]. 09/20/2021 8:00 [name] Medical Record Number: [id number] Patient Account Number: [id number] Date of Birth/Sex: Treating RN: 12-04-1945 (75 y.o. Antonio [name], [name] Primary Care Aleyah Balik: [name] Other Clinician: Referring Nela Bascom: Treating Cieanna Stormes/Extender: [name] in Treatment: 6 Clinic Level of Care Assessment Items TOOL 4 Quantity Score X- 1 0 Use when only an EandM is performed on FOLLOW-UP  visit ASSESSMENTS - Nursing Assessment / Reassessment X- 1 10 Reassessment of Co-morbidities (includes updates in patient status) X- 1 5 Reassessment of Adherence to Treatment Plan ASSESSMENTS - Wound and Skin [name] ssessment / Reassessment []  - 0 Simple Wound Assessment / Reassessment - one wound []  - 0 Complex Wound Assessment / Reassessment - multiple wounds []  - 0 Dermatologic / Skin Assessment (not related to wound area) ASSESSMENTS - Focused Assessment []  - 0 Circumferential Edema Measurements - multi extremities []  - 0 Nutritional Assessment / Counseling / Intervention []  - 0 Lower Extremity Assessment (monofilament, tuning fork, pulses) []  - 0 Peripheral Arterial Disease Assessment (using hand held doppler) ASSESSMENTS - Ostomy and/or Continence Assessment and Care []  - 0 Incontinence Assessment and Management []  - 0 Ostomy Care Assessment and Management (repouching, etc.) PROCESS - Coordination of Care X - Simple Patient / Family Education for ongoing care 1 15 []  - 0 Complex (extensive) Patient / Family Education for ongoing care X- 1 10 Staff obtains Programmer, systems, Records, T Results / Process Orders est []  - 0 Staff telephones HHA, Nursing Homes / Clarify orders / etc []  - 0 Routine Transfer to another Facility (non-emergent condition) []  - 0 Routine Hospital Admission (non-emergent condition) []  - 0 New Admissions / Biomedical engineer / Ordering NPWT Apligraf, etc. , []  - 0 Emergency Hospital Admission (emergent condition) X- 1 10 Simple Discharge Coordination []  - 0 Complex (extensive) Discharge Coordination PROCESS - Special Needs []  - 0 Pediatric / Minor Patient Management []  - 0 Isolation Patient Management []  - 0 Hearing / Language / Visual special needs []  - 0 Assessment of Community assistance (transportation, D/C planning, etc.) []  - 0 Additional assistance / Altered mentation []  - 0 Support Surface(s) Assessment (bed, cushion, seat,  etc.) INTERVENTIONS - Wound Cleansing / Measurement []  - 0 Simple Wound Cleansing - one wound []  - 0 Complex Wound Cleansing - multiple wounds []  - 0 Wound Imaging (  photographs - any number of wounds) []  - 0 Wound Tracing (instead of photographs) []  - 0 Simple Wound Measurement - one wound []  - 0 Complex Wound Measurement - multiple wounds INTERVENTIONS - Wound Dressings []  - 0 Small Wound Dressing one or multiple wounds []  - 0 Medium Wound Dressing one or multiple wounds []  - 0 Large Wound Dressing one or multiple wounds []  - 0 Application of Medications - topical []  - 0 Application of Medications - injection INTERVENTIONS - Miscellaneous []  - 0 External ear exam []  - 0 Specimen Collection (cultures, biopsies, blood, body fluids, etc.) []  - 0 Specimen(s) / Culture(s) sent or taken to Lab for analysis []  - 0 Patient Transfer (multiple staff / Civil Service fast streamer / Similar devices) []  - 0 Simple Staple / Suture removal (25 or less) []  - 0 Complex Staple / Suture removal (26 or more) []  - 0 Hypo / Hyperglycemic Management (close monitor of Blood Glucose) []  - 0 Ankle / Brachial Index (ABI) - do not check if billed separately X- 1 5 Vital Signs Has the patient been seen at the hospital within the last three years: Yes Total Score: 55 Level Of Care: New/Established - Level 2 Electronic Signature(s) Signed: 09/20/2021 3:55:54 PM By: Deon Pilling RN, BSN Entered By: Deon Pilling on 09/20/2021 08:15:52 -------------------------------------------------------------------------------- Encounter Discharge Information Details Patient Name: Date of Service: Antonio [name]. 09/20/2021 8:00 [name] Medical Record Number: [id number] Patient Account Number: [id number] Date of Birth/Sex: Treating RN: 04/15/1946 (75 y.o. [name] Primary Care Tremayne Sheldon: [name] Other Clinician: Referring Aily Tzeng: Treating Jeb Schloemer/Extender: [name] in  Treatment: 6 Encounter Discharge Information Items Discharge Condition: Stable Ambulatory Status: Ambulatory Transportation: Private Auto Accompanied By: self Schedule Follow-up Appointment: Yes Clinical Summary of Care: Notes Patient to have HBO treatment now. Electronic Signature(s) Signed: 09/20/2021 3:55:54 PM By: Deon Pilling RN, BSN Entered By: Deon Pilling on 09/20/2021 08:16:29 -------------------------------------------------------------------------------- Lower Extremity Assessment Details Patient Name: Date of Service: Antonio [name]. 09/20/2021 8:00 [name] Medical Record Number: [id number] Patient Account Number: [id number] Date of Birth/Sex: Treating RN: October 17, 1945 (75 y.o. [name] Primary Care Alder Murri: [name] Other Clinician: Referring Mystie Ormand: Treating Yaneisy Wenz/Extender: [name] in Treatment: 6 Electronic Signature(s) Signed: 09/20/2021 3:55:54 PM By: Deon Pilling RN, BSN Entered By: Deon Pilling on 09/20/2021 08:12:37 -------------------------------------------------------------------------------- [place] Details Patient Name: Date of Service: Antonio [name]. 09/20/2021 8:00 [name] Medical Record Number: [id number] Patient Account Number: [id number] Date of Birth/Sex: Treating RN: 02-04-1946 (75 y.o. Antonio [name], [name] Primary Care Alyria Krack: [name] Other Clinician: Referring Kambree Krauss: Treating Enoc Getter/Extender: [name] in Treatment: 6 Multidisciplinary Care Plan reviewed with physician Active Inactive HBO Nursing Diagnoses: Anxiety related to feelings of confinement associated with the hyperbaric oxygen chamber Anxiety related to knowledge deficit of hyperbaric oxygen therapy and treatment procedures Discomfort related to temperature and humidity changes inside hyperbaric chamber Potential for barotraumas to ears, sinuses, teeth, and lungs or  cerebral gas embolism related to changes in atmospheric pressure inside hyperbaric oxygen chamber Potential for oxygen toxicity seizures related to delivery of 100% oxygen at an increased atmospheric pressure Potential for pulmonary oxygen toxicity related to delivery of 100% oxygen at an increased atmospheric pressure Goals: Barotrauma will be prevented during HBO2 Date Initiated: 08/09/2021 Target Resolution Date: 10/05/2021 Goal Status: Active Patient and/or family will be able to state/discuss factors appropriate to the management of their disease process during treatment  Date Initiated: 08/09/2021 Target Resolution Date: 10/05/2021 Goal Status: Active Patient will tolerate the hyperbaric oxygen therapy treatment Date Initiated: 08/09/2021 Target Resolution Date: 10/05/2021 Goal Status: Active Patient will tolerate the internal climate of the chamber Date Initiated: 08/09/2021 Target Resolution Date: 10/05/2021 Goal Status: Active Patient/caregiver will verbalize understanding of HBO goals, rationale, procedures and potential hazards Date Initiated: 08/09/2021 Target Resolution Date: 10/05/2021 Goal Status: Active Signs and symptoms of pulmonary oxygen toxicity will be recognized and promptly addressed Date Initiated: 08/09/2021 Target Resolution Date: 10/05/2021 Goal Status: Active Signs and symptoms of seizure will be recognized and promptly addressed ; seizing patients will suffer no harm Date Initiated: 08/09/2021 Target Resolution Date: 10/05/2021 Goal Status: Active Interventions: Administer [name] five (5) minute air break for patient if signs and symptoms of seizure appear and notify the hyperbaric physician Administer decongestants, per physician orders, prior to HBO2 Administer the correct therapeutic gas delivery based on the patients needs and limitations, per physician order Assess and provide for patients comfort related to the hyperbaric environment and equalization  of middle ear Assess for signs and symptoms related to adverse events, including but not limited to confinement anxiety, pneumothorax, oxygen toxicity and baurotrauma Assess patient for any history of confinement anxiety Assess patient's knowledge and expectations regarding hyperbaric medicine and provide education related to the hyperbaric environment, goals of treatment and prevention of adverse events Implement protocols to decrease risk of pneumothorax in high risk patients Notes: Electronic Signature(s) Signed: 09/20/2021 3:55:54 PM By: Deon Pilling RN, BSN Entered By: Deon Pilling on 09/20/2021 08:15:05 -------------------------------------------------------------------------------- Pain Assessment Details Patient Name: Date of Service: Antonio [name]. 09/20/2021 8:00 [name] Medical Record Number: [id number] Patient Account Number: [id number] Date of Birth/Sex: Treating RN: [birth year] (75 y.o. [name] Primary Care Hodan Wurtz: [name] Other Clinician: Referring Rhona Fusilier: Treating Takeyah Wieman/Extender: [name] in Treatment: 6 Active Problems Location of Pain Severity and Description of Pain Patient Has Paino No Site Locations Rate the pain. Current Pain Level: 0 Pain Management and Medication Current Pain Management: Medication: No Cold Application: No Rest: No Massage: No Activity: No T.E.N.S.: No Heat Application: No Leg drop or elevation: No Is the Current Pain Management Adequate: Adequate How does your wound impact your activities of daily livingo Sleep: No Bathing: No Appetite: No Relationship With Others: No Bladder Continence: No Emotions: No Bowel Continence: No Work: No Toileting: No Drive: No Dressing: No Hobbies: No Engineer, maintenance) Signed: 09/20/2021 3:55:54 PM By: Deon Pilling RN, BSN Entered By: Deon Pilling on 09/20/2021  08:12:30 -------------------------------------------------------------------------------- Patient/Caregiver Education Details Patient Name: Date of Service: Antonio [name]. 12/8/2022andnbsp8:00 [name] Medical Record Number: [id number] Patient Account Number: [id number] Date of Birth/Gender: Treating RN: Dec 21, 1945 (75 y.o. [name] Primary Care Physician: [name] Other Clinician: Referring Physician: Treating Physician/Extender: [name] in Treatment: 6 Education Assessment Education Provided To: Patient Education Topics Provided Hyperbaric Oxygenation: Handouts: Hyperbaric Oxygen Methods: Explain/Verbal Responses: Reinforcements needed Electronic Signature(s) Signed: 09/20/2021 3:55:54 PM By: Deon Pilling RN, BSN Entered By: Deon Pilling on 09/20/2021 08:15:20 -------------------------------------------------------------------------------- Vitals Details Patient Name: Date of Service: Antonio [name]. 09/20/2021 8:00 [name] Medical Record Number: [id number] Patient Account Number: [id number] Date of Birth/Sex: Treating RN: 11-03-1945 (75 y.o. M) Primary Care Mardella Nuckles: [name] Other Clinician: Referring Tyan Lasure: Treating Morgen Linebaugh/Extender: [name] in Treatment: 6 Vital Signs Time Taken: 07:57 Temperature (F): 98.7 Height (in): 67 Pulse (bpm): 76 Weight (lbs): 170 Respiratory  Rate (breaths/min): 20 Body Mass Index (BMI): 26.6 Blood Pressure (mmHg): 123/73 Capillary Blood Glucose (mg/dl): 141 Reference Range: 80 - 120 mg / dl Electronic Signature(s) Signed: 09/20/2021 8:23:21 AM By: [name] CHT EMT BS , , Entered By: [name] on 09/20/2021 08:23:21

## 2021-09-20 NOTE — Progress Notes (Addendum)
Antonio [name] ([id number]) Visit Report for 09/20/2021 HBO Details Patient Name: Date of Service: Antonio [name] [name] 09/20/2021 9:00 A M Medical Record Number: [id number] Patient Account Number: [id number] Date of Birth/Sex: Treating RN: [birth year] (75 y.o. Antonio [name], Antonio [name] Primary Care Venola Castello: [name] Other Clinician: [name] Referring Briston Lax: Treating Gabrial Domine/Extender: [name] in Treatment: 6 HBO Treatment Course Details Treatment Course Number: 1 Ordering Jimia Gentles: Bernerd Pho Treatments Ordered: otal 40 HBO Treatment Start Date: 08/23/2021 HBO Indication: Late Effect of Radiation HBO Treatment Details Treatment Number: 18 Patient Type: Outpatient Chamber Type: Monoplace Chamber Serial #: [id number] Treatment Protocol: 2.0 ATA with 90 minutes oxygen, and no air breaks Treatment Details Compression Rate Down: 2.0 psi / minute De-Compression Rate Up: 2.0 psi / minute Air breaks and breathing Decompress Decompress Compress Tx Pressure Begins Reached periods Begins Ends (leave unused spaces blank) Chamber Pressure (ATA 1 2 ------2 1 ) Clock Time (24 hr) 08:16 08:24 - - - - - - 09:54 10:08 Treatment Length: 112 (minutes) Treatment Segments: 4 Vital Signs Capillary Blood Glucose Reference Range: 80 - 120 mg / dl HBO Diabetic Blood Glucose Intervention Range: <131 mg/dl or >249 mg/dl Type: Time Vitals Blood Pulse: Respiratory Temperature: Capillary Blood Glucose Pulse Action Taken: Pressure: Rate: Glucose (mg/dl): Meter #: Oximetry (%) Taken: Pre 07:57 123/73 76 20 98.7 141 Post 10:12 140/76 115 discharge per protocol >101 mg/dL Treatment Response Treatment Toleration: Well Treatment Completion Status: Treatment Completed without Adverse Event Gibril Mastro Notes Patient was also seen for evaluation today. Record dictated in a separate location. He tolerates his treatments with hyperbarics well Physician HBO  Attestation: I certify that I supervised this HBO treatment in accordance with Medicare guidelines. A trained emergency response team is readily available per Yes hospital policies and procedures. Continue HBOT as ordered. Yes Electronic Signature(s) Signed: 09/20/2021 4:05:05 PM By: [name] MD Previous Signature: 09/20/2021 11:06:26 AM Version By: [name] CHT EMT BS , , Entered By: [name] on 09/20/2021 15:29:54 -------------------------------------------------------------------------------- HBO Safety Checklist Details Patient Name: Date of Service: Antonio [place] MA S A. 09/20/2021 9:00 A M Medical Record Number: [id number] Patient Account Number: [id number] Date of Birth/Sex: Treating RN: July 13, 1946 (75 y.o. Antonio [name], Antonio [name] Primary Care Kinslee Dalpe: [name] Other Clinician: [name] Referring Skyeler Scalese: Treating Dshaun Reppucci/Extender: [name] in Treatment: 6 HBO Safety Checklist Items Safety Checklist Consent Form Signed Patient voided / foley secured and emptied When did you last eato 0630 Last dose of injectable or oral agent 0630 Ostomy pouch emptied and vented if applicable NA All implantable devices assessed, documented and approved NA Intravenous access site secured and place NA Valuables secured Linens and cotton and cotton/polyester blend (less than 51% polyester) Personal oil-based products / skin lotions / body lotions removed Wigs or hairpieces removed NA Smoking or tobacco materials removed NA Books / newspapers / magazines / loose paper removed Cologne, aftershave, perfume and deodorant removed Jewelry removed (may wrap wedding band) Make-up removed NA Hair care products removed Battery operated devices (external) removed Heating patches and chemical warmers removed Titanium eyewear removed NA Nail polish cured greater than 10 hours NA Casting material cured greater than 10  hours NA Hearing aids removed NA Loose dentures or partials removed dentures removed Prosthetics have been removed NA Patient demonstrates correct use of air break device (if applicable) Patient concerns have been addressed Patient grounding bracelet on and cord attached to chamber Specifics for Inpatients (complete in addition to above)  Medication sheet sent with patient NA Intravenous medications needed or due during therapy sent with patient NA Drainage tubes (e.g. nasogastric tube or chest tube secured and vented) NA Endotracheal or Tracheotomy tube secured NA Cuff deflated of air and inflated with saline NA Airway suctioned NA Electronic Signature(s) Signed: 09/20/2021 8:51:47 AM By: [name] CHT EMT BS , , Entered By: [name] on 09/20/2021 08:51:47

## 2021-09-20 NOTE — Progress Notes (Signed)
Antonio Antonio [name] Antonio [name], Antonio Antonio [name] Antonio [name] ([id number]) Visit Report for 09/20/2021 HPI Details Patient Name: Date of Service: Antonio Antonio [name] Antonio [name], Antonio [name] [place] Antonio [name]. 09/20/2021 8:00 Antonio Antonio [name] Medical Record Number: [id number] Patient Account Number: [id number] Date of Birth/Sex: Treating RN: January 14, 1946 (75 y.o. [name]) Primary Care Provider: Lorene [name] Other Clinician: Referring Provider: Treating Provider/Extender: [name] in Treatment: 6 History of Present Illness HPI Description: ADMISSION 08/09/2021 This is Antonio [name] 75 year old man who was treated with external beam radiation for cancer of the prostate in 2009. At this point I do not have any of his radiation oncology records. He said over the years he has had 2 prior episodes of bleeding with voiding however they stop spontaneously. In this case his bleeding started about 1-1/2 months ago. He required hospitalization from 07/13/2021 through 07/22/2021 with severe hematuria and clots. he underwent Foley catheter placement and continuous bladder irrigation. However he continued to have gross hematuria. He was therefore taken to cystoscopy and fulguration on 07/18/2021. He was discharged with Antonio [name] Foley catheter but this was ultimately removed on 10/12. 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 He required fulguration of the prostate and bladder neck. 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 He had Antonio [name] trabeculated bladder with cellules in the trigonal region. The patient has continued to have gross hematuria with clots but he has not been obstructed. He was recommended for hyperbaric oxygen therapy by Dr. Louis Meckel. As noted he did not tolerate having his Foley catheter removed. In spite of the fact his blood thinners were held his bleeding continued Past medical history includes coronary artery disease status post CABG in 1996 but I am unable to get him describe any exertional chest  symptoms today. Hypertension, hyperlipidemia, type 2 diabetes, BPH, prostate CA treated with radiation in 2009. Admitted to hospital in 07/19/2020 with unstable angina.. I note that he was in the ER on 05/31/2016 with hematuria and urinary retention 09/20/2021; patient is on her about treatment #17. He is tolerating these well without any undue problems. However he tells me he has had little change in his symptoms. He is still having continuous gross hematuria with episodic clots but he has not been obstructed. His frequency at night is about the same. He does not have any dysuria Electronic Signature(s) Signed: 09/20/2021 4:05:05 PM By: [name] MD Entered By: [name] on 09/20/2021 08:17:30 -------------------------------------------------------------------------------- Physical Exam Details Patient Name: Date of Service: Antonio Antonio [name] Antonio [name]. 09/20/2021 8:00 Antonio Antonio [name] Medical Record Number: [id number] Patient Account Number: [id number] Date of Birth/Sex: Treating RN: 09-16-1946 (75 y.o. [name]) Primary Care Provider: Lorene [name] Other Clinician: Referring Provider: Treating Provider/Extender: [name] in Treatment: 6 Gastrointestinal (GI) There is no evidence of bladder distention nontender. Electronic Signature(s) Signed: 09/20/2021 4:05:05 PM By: [name] MD Entered By: [name] on 09/20/2021 08:19:49 -------------------------------------------------------------------------------- Physician Orders Details Patient Name: Date of Service: Antonio Antonio [name] Antonio [name]. 09/20/2021 8:00 Antonio Antonio [name] Medical Record Number: [id number] Patient Account Number: [id number] Date of Birth/Sex: Treating RN: 1946-01-04 (75 y.o. Antonio Antonio [name] Antonio [name] Primary Care Provider: Lorene [name] Other Clinician: Referring Provider: Treating Provider/Extender: [name] in Treatment: 6 Verbal / Phone Orders: No Diagnosis Coding ICD-10 Coding Code  Description N30.41 Irradiation cystitis with hematuria Z85.46 Personal history of malignant neoplasm of prostate R35.0 Frequency of micturition Follow-up Appointments Return appointment in 1 month. - Follow up with Dr. Dellia Nims Continue hyberbarics Hyperbaric Oxygen Therapy Evaluate for  HBO Therapy Indication: - Radiation Cystitis If appropriate for treatment, begin HBOT per protocol: 2.0 ATA for 90 Minutes without Antonio [name] Breaks ir Total Number of Treatments: - 40 One treatments per day (delivered Monday through Friday unless otherwise specified in Special Instructions below): Finger stick Blood Glucose Pre- and Post- HBOT Treatment. Follow Hyperbaric Oxygen Glycemia Protocol Antonio [name] frin (Oxymetazoline HCL) 0.05% nasal spray - 1 spray in both nostrils daily as needed prior to HBO treatment for difficulty clearing ears GLYCEMIA INTERVENTIONS PROTOCOL PRE-HBO GLYCEMIA INTERVENTIONS ACTION INTERVENTION Obtain pre-HBO capillary blood glucose (ensure 1 physician order is in chart). Antonio [name]. Notify HBO physician and await physician orders. 2 If result is 70 mg/dl or below: B. If the result meets the hospital definition of Antonio [name] critical result, follow hospital policy. Antonio [name]. Give patient an 8 ounce Glucerna Shake, an 8 ounce Ensure, or 8 ounces of Antonio [name] Glucerna/Ensure equivalent dietary supplement*. B. Wait 30 minutes. If result is 71 mg/dl to 130 mg/dl: C. Retest patients capillary blood glucose (CBG). D. If result greater than or equal to 110 mg/dl, proceed with HBO. If result less than 110 mg/dl, notify HBO physician and consider holding HBO. If result is 131 mg/dl to 249 mg/dl: Antonio [name]. Proceed with HBO. Antonio [name]. Notify HBO physician and await physician orders. B. It is recommended to hold HBO and do If result is 250 mg/dl or greater: blood/urine ketone testing. C. If the result meets the hospital definition of Antonio [name] critical result, follow hospital policy. POST-HBO GLYCEMIA INTERVENTIONS ACTION  INTERVENTION Obtain post HBO capillary blood glucose (ensure 1 physician order is in chart). Antonio [name]. Notify HBO physician and await physician orders. 2 If result is 70 mg/dl or below: B. If the result meets the hospital definition of Antonio [name] critical result, follow hospital policy. Antonio [name]. Give patient an 8 ounce Glucerna Shake, an 8 ounce Ensure, or 8 ounces of Antonio [name] Glucerna/Ensure equivalent dietary supplement*. B. Wait 15 minutes for symptoms of If result is 71 mg/dl to 100 mg/dl: hypoglycemia (i.e. nervousness, anxiety, sweating, chills, clamminess, irritability, confusion, tachycardia or dizziness). C. If patient asymptomatic, discharge patient. If patient symptomatic, repeat capillary blood glucose (CBG) and notify HBO physician. If result is 101 mg/dl to 249 mg/dl: Antonio [name]. Discharge patient. Antonio [name]. Notify HBO physician and await physician orders. B. It is recommended to do blood/urine ketone If result is 250 mg/dl or greater: testing. C. If the result meets the hospital definition of Antonio [name] critical result, follow hospital policy. *Juice or candies are NOT equivalent products. If patient refuses the Glucerna or Ensure, please consult the hospital dietitian for an appropriate substitute. Electronic Signature(s) Signed: 09/20/2021 3:55:54 PM By: Deon Pilling RN, BSN Signed: 09/20/2021 4:05:05 PM By: [name] MD Entered By: Deon Pilling on 09/20/2021 08:56:43 -------------------------------------------------------------------------------- Problem List Details Patient Name: Date of Service: Antonio Antonio [name] Antonio [name]. 09/20/2021 8:00 Antonio Antonio [name] Medical Record Number: [id number] Patient Account Number: [id number] Date of Birth/Sex: Treating RN: [birth year] (75 y.o. Antonio Antonio [name] Antonio [name] Primary Care Provider: Lorene [name] Other Clinician: Referring Provider: Treating Provider/Extender: [name] in Treatment: 6 Active Problems ICD-10 Encounter Code Description Active Date  MDM Diagnosis N30.41 Irradiation cystitis with hematuria 08/09/2021 No Yes Z85.46 Personal history of malignant neoplasm of prostate 08/09/2021 No Yes R35.0 Frequency of micturition 08/09/2021 No Yes Inactive Problems Resolved Problems Electronic Signature(s) Signed: 09/20/2021 4:05:05 PM By: [name] MD Entered By: [name] on 09/20/2021 08:16:38 -------------------------------------------------------------------------------- Progress Note Details Patient Name: Date of Service: Antonio Antonio [name] Antonio [name]. 09/20/2021 8:00 Antonio Antonio [name]  Medical Record Number: [id number] Patient Account Number: [id number] Date of Birth/Sex: Treating RN: [birth year] (75 y.o. [name]) Primary Care Provider: Lorene [name] Other Clinician: Referring Provider: Treating Provider/Extender: [name] in Treatment: 6 Subjective History of Present Illness (HPI) ADMISSION 08/09/2021 This is Antonio [name] 75 year old man who was treated with external beam radiation for cancer of the prostate in 2009. At this point I do not have any of his radiation oncology records. He said over the years he has had 2 prior episodes of bleeding with voiding however they stop spontaneously. In this case his bleeding started about 1-1/2 months ago. He required hospitalization from 07/13/2021 through 07/22/2021 with severe hematuria and clots. he underwent Foley catheter placement and continuous bladder irrigation. However he continued to have gross hematuria. He was therefore taken to cystoscopy and fulguration on 07/18/2021. He was discharged with Antonio [name] Foley catheter but this was ultimately removed on 10/12. 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 He required fulguration of the prostate and bladder neck. 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 He had Antonio [name] trabeculated bladder with cellules in the trigonal region. The patient has continued to  have gross hematuria with clots but he has not been obstructed. He was recommended for hyperbaric oxygen therapy by Dr. Louis Meckel. As noted he did not tolerate having his Foley catheter removed. In spite of the fact his blood thinners were held his bleeding continued Past medical history includes coronary artery disease status post CABG in 1996 but I am unable to get him describe any exertional chest symptoms today. Hypertension, hyperlipidemia, type 2 diabetes, BPH, prostate CA treated with radiation in 2009. Admitted to hospital in 07/19/2020 with unstable angina.. I note that he was in the ER on 05/31/2016 with hematuria and urinary retention 09/20/2021; patient is on her about treatment #17. He is tolerating these well without any undue problems. However he tells me he has had little change in his symptoms. He is still having continuous gross hematuria with episodic clots but he has not been obstructed. His frequency at night is about the same. He does not have any dysuria Objective Constitutional Vitals Time Taken: 7:57 AM, Height: 67 in, Weight: 170 lbs, BMI: 26.6, Temperature: 98.7 F, Pulse: 76 bpm, Respiratory Rate: 20 breaths/min, Blood Pressure: 123/73 mmHg, Capillary Blood Glucose: 141 mg/dl. Gastrointestinal (GI) There is no evidence of bladder distention nontender. Assessment Active Problems ICD-10 Irradiation cystitis with hematuria Personal history of malignant neoplasm of prostate Frequency of micturition Plan Follow-up Appointments: Return appointment in 1 month. - Follow up with Dr. Dellia Nims Continue hyberbarics Hyperbaric Oxygen Therapy: Evaluate for HBO Therapy Indication: - Radiation Cystitis If appropriate for treatment, begin HBOT per protocol: 2.0 ATA for 90 Minutes without Air Breaks T Number of Treatments: - 40 otal One treatments per day (delivered Monday through Friday unless otherwise specified in Special Instructions below): Finger stick Blood Glucose Pre- and  Post- HBOT Treatment. Follow Hyperbaric Oxygen Glycemia Protocol Afrin (Oxymetazoline HCL) 0.05% nasal spray - 1 spray in both nostrils daily as needed prior to HBO treatment for difficulty clearing ears #1 unfortunately no improvement in this gentlemen's hematuria or urinary frequency although it would be Antonio [name] little optimistic at this point to expect major changes [treatment #17] 2. He is tolerating HBO well and will continue this Electronic Signature(s) Signed: 09/20/2021 3:55:54 PM By: Deon Pilling RN, BSN Signed: 09/20/2021 4:05:05 PM By: [name] MD Entered By: Deon Pilling on 09/20/2021  20:10:07 -------------------------------------------------------------------------------- SuperBill Details Patient Name: Date of Service: Antonio Antonio [name] Antonio [name], Antonio Antonio [name] Antonio [name] 09/20/2021 Medical Record Number: [id number] Patient Account Number: [id number] Date of Birth/Sex: Treating RN: 09-19-1946 (75 y.o. Antonio Antonio [name] Antonio [name] Primary Care Provider: Lorene [name] Other Clinician: Referring Provider: Treating Provider/Extender: [name] in Treatment: 6 Diagnosis Coding ICD-10 Codes Code Description N30.41 Irradiation cystitis with hematuria Z85.46 Personal history of malignant neoplasm of prostate R35.0 Frequency of micturition Facility Procedures CPT4 Code: [id number] Description: 15830 - WOUND CARE VISIT-LEV 2 EST PT Modifier: Quantity: 1 Electronic Signature(s) Signed: 09/20/2021 4:05:05 PM By: [name] MD Entered By: [name] on 09/20/2021 08:22:37

## 2021-09-20 NOTE — Progress Notes (Addendum)
Antonio [name], Antonio [name] ([id number]) Visit Report for 09/20/2021 Arrival Information Details Patient Name: Date of Service: Antonio [name], Antonio [name] [name] 09/20/2021 9:00 A M Medical Record Number: [id number] Patient Account Number: [id number] Date of Birth/Sex: Treating RN: 01/28/1946 (75 y.o. Antonio [name], [name] Primary Care [place] Freyre: [name] Other Clinician: [name] Referring Mckinzi Eriksen: Treating Taiten Brawn/Extender: [name] in Treatment: 6 Visit Information History Since Last Visit All ordered tests and consults were completed: Yes Patient Arrived: Ambulatory Added or deleted any medications: No Arrival Time: 07:37 Any new allergies or adverse reactions: No Accompanied By: self Had a fall or experienced change in No Transfer Assistance: None activities of daily living that may affect Patient Identification Verified: Yes risk of falls: Secondary Verification Process Completed: Yes Signs or symptoms of abuse/neglect since last visito No Patient Requires Transmission-Based Precautions: No Hospitalized since last visit: No Patient Has Alerts: No Implantable device outside of the clinic excluding No cellular tissue based products placed in the center since last visit: Pain Present Now: No Electronic Signature(s) Signed: 09/20/2021 8:50:01 AM By: [name] CHT EMT BS , , Entered By: [name] on 09/20/2021 08:50:00 -------------------------------------------------------------------------------- Encounter Discharge Information Details Patient Name: Date of Service: Antonio [place] MA S A. 09/20/2021 9:00 A M Medical Record Number: [id number] Patient Account Number: [id number] Date of Birth/Sex: Treating RN: 19-Oct-1945 (75 y.o. Antonio [name] Primary Care Lashe Oliveira: [name] Other Clinician: [name] Referring Tanny Harnack: Treating Berlie Hatchel/Extender: [name] in Treatment: 6 Encounter Discharge  Information Items Discharge Condition: Stable Ambulatory Status: Ambulatory Discharge Destination: Home Transportation: Private Auto Accompanied By: self Schedule Follow-up Appointment: No Clinical Summary of Care: Electronic Signature(s) Signed: 09/20/2021 11:07:22 AM By: [name] CHT EMT BS , , Entered By: [name] on 09/20/2021 11:07:22 -------------------------------------------------------------------------------- Vitals Details Patient Name: Date of Service: Antonio Baltimore MA S A. 09/20/2021 9:00 A M Medical Record Number: [id number] Patient Account Number: [id number] Date of Birth/Sex: Treating RN: [birth year] (75 y.o. Antonio [name], Antonio [name] Primary Care Jahquez Steffler: [name] Other Clinician: [name] Referring Faolan [place]: Treating Gerado Nabers/Extender: [name] in Treatment: 6 Vital Signs Time Taken: 07:57 Temperature (F): 98.7 Height (in): 67 Pulse (bpm): 76 Weight (lbs): 170 Respiratory Rate (breaths/min): 20 Body Mass Index (BMI): 26.6 Blood Pressure (mmHg): 123/73 Capillary Blood Glucose (mg/dl): 141 Reference Range: 80 - 120 mg / dl Electronic Signature(s) Signed: 09/20/2021 8:50:39 AM By: [name] CHT EMT BS , , Entered By: [name] on 09/20/2021 08:50:39

## 2021-09-20 NOTE — Progress Notes (Signed)
CALI, CUARTAS ([id number]) Visit Report for 09/20/2021 SuperBill Details Patient Name: Date of Service: Antonio [name], Antonio [name] [place] 09/20/2021 Medical Record Number: [id number] Patient Account Number: [id number] Date of Birth/Sex: Treating RN: [birth year] (75 y.o. [name] Primary Care Provider: [name] Other Clinician: [name] Referring Provider: Treating Provider/Extender: [name] in Treatment: 6 Diagnosis Coding ICD-10 Codes Code Description N30.41 Irradiation cystitis with hematuria Z85.46 Personal history of malignant neoplasm of prostate R35.0 Frequency of micturition Facility Procedures CPT4 Code Description Modifier Quantity [id number]-(Facility Use Only) HBOT full body chamber, 64min , 4 ICD-10 Diagnosis Description N30.41 Irradiation cystitis with hematuria Z85.46 Personal history of malignant neoplasm of prostate R35.0 Frequency of micturition Physician Procedures Quantity CPT4 Code Description Modifier [id number] - WC PHYS HYPERBARIC OXYGEN THERAPY 1 ICD-10 Diagnosis Description N30.41 Irradiation cystitis with hematuria Z85.46 Personal history of malignant neoplasm of prostate R35.0 Frequency of micturition Electronic Signature(s) Signed: 09/20/2021 11:06:51 AM By: [name] CHT EMT BS , , Signed: 09/20/2021 4:05:05 PM By: [name] MD Entered By: [name] on 09/20/2021 11:06:50

## 2021-09-21 ENCOUNTER — Other Ambulatory Visit: Payer: Self-pay

## 2021-09-21 ENCOUNTER — Encounter (HOSPITAL_BASED_OUTPATIENT_CLINIC_OR_DEPARTMENT_OTHER): Payer: Medicare Other | Admitting: Internal Medicine

## 2021-09-21 DIAGNOSIS — N3041 Irradiation cystitis with hematuria: Secondary | ICD-10-CM | POA: Diagnosis not present

## 2021-09-21 DIAGNOSIS — R350 Frequency of micturition: Secondary | ICD-10-CM

## 2021-09-21 DIAGNOSIS — Z8546 Personal history of malignant neoplasm of prostate: Secondary | ICD-10-CM

## 2021-09-21 LAB — GLUCOSE, CAPILLARY
Glucose-Capillary: 197 mg/dL — ABNORMAL HIGH (ref 70–99)
Glucose-Capillary: 112 mg/dL — ABNORMAL HIGH (ref 70–99)

## 2021-09-21 NOTE — Progress Notes (Addendum)
Antonio [name], Antonio [name] ([id number]) Visit Report for 09/21/2021 HBO Details Patient Name: Date of Service: Antonio [name], Antonio [name] 09/21/2021 7:30 A M Medical Record Number: [id number] Patient Account Number: [id number] Date of Birth/Sex: Treating RN: 1946-05-10 (75 y.o. [name] Primary Care Casandra Dallaire: [name] Other Clinician: [name] Referring Kevan Prouty: Treating Micholas Drumwright/Extender: Krista Blue in Treatment: 6 HBO Treatment Course Details Treatment Course Number: 1 Ordering Charlotte Brafford: Bernerd Pho Treatments Ordered: otal 40 HBO Treatment Start Date: 08/23/2021 HBO Indication: Late Effect of Radiation HBO Treatment Details Treatment Number: 19 Patient Type: Outpatient Chamber Type: Monoplace Chamber Serial #: [id number] Treatment Protocol: 2.0 ATA with 90 minutes oxygen, and no air breaks Treatment Details Compression Rate Down: 2.0 psi / minute De-Compression Rate Up: 2.0 psi / minute Air breaks and breathing Decompress Decompress Compress Tx Pressure Begins Reached periods Begins Ends (leave unused spaces blank) Chamber Pressure (ATA 1 2 ------2 1 ) Clock Time (24 hr) 08:28 08:36 - - - - - - 10:06 10:14 Treatment Length: 106 (minutes) Treatment Segments: 4 Vital Signs Capillary Blood Glucose Reference Range: 80 - 120 mg / dl HBO Diabetic Blood Glucose Intervention Range: <131 mg/dl or >249 mg/dl Time Vitals Blood Respiratory Capillary Blood Glucose Pulse Action Type: Pulse: Temperature: Taken: Pressure: Rate: Glucose (mg/dl): Meter #: Oximetry (%) Taken: Pre 07:40 133/71 77 16 98.5 197 Post 10:17 135/89 67 16 98.4 112 Treatment Response Treatment Toleration: Well Treatment Completion Status: Treatment Completed without Adverse Event Physician HBO Attestation: I certify that I supervised this HBO treatment in accordance with Medicare guidelines. A trained emergency response team is readily available per  Yes hospital policies and procedures. Continue HBOT as ordered. Yes Electronic Signature(s) Signed: 09/21/2021 12:05:05 PM By: Kalman Shan DO Previous Signature: 09/21/2021 11:13:49 AM Version By: [name] CHT EMT BS , , Entered By: Kalman Shan on 09/21/2021 12:03:46 -------------------------------------------------------------------------------- HBO Safety Checklist Details Patient Name: Date of Service: Antonio Baltimore MA S A. 09/21/2021 7:30 A M Medical Record Number: [id number] Patient Account Number: [id number] Date of Birth/Sex: Treating RN: 1946-10-06 (75 y.o. [name] Primary Care Leticia Coletta: [name] Other Clinician: [name] Referring Pegah Segel: Treating Mersadez Linden/Extender: Krista Blue in Treatment: 6 HBO Safety Checklist Items Safety Checklist Consent Form Signed Patient voided / foley secured and emptied When did you last eato 0625 Last dose of injectable or oral agent 0625 Ostomy pouch emptied and vented if applicable NA All implantable devices assessed, documented and approved NA Intravenous access site secured and place NA Valuables secured Linens and cotton and cotton/polyester blend (less than 51% polyester) Personal oil-based products / skin lotions / body lotions removed Wigs or hairpieces removed NA Smoking or tobacco materials removed NA Books / newspapers / magazines / loose paper removed Cologne, aftershave, perfume and deodorant removed Jewelry removed (may wrap wedding band) Make-up removed NA Hair care products removed Battery operated devices (external) removed Heating patches and chemical warmers removed Titanium eyewear removed NA Nail polish cured greater than 10 hours NA Casting material cured greater than 10 hours NA Hearing aids removed NA Loose dentures or partials removed dentures removed Prosthetics have been removed NA Patient demonstrates correct use of air  break device (if applicable) Patient concerns have been addressed Patient grounding bracelet on and cord attached to chamber Specifics for Inpatients (complete in addition to above) Medication sheet sent with patient NA Intravenous medications needed or due during therapy sent with patient NA Drainage tubes (e.g. nasogastric tube or chest  tube secured and vented) NA Endotracheal or Tracheotomy tube secured NA Cuff deflated of air and inflated with saline NA Airway suctioned NA Electronic Signature(s) Signed: 09/21/2021 9:17:57 AM By: [name] CHT EMT BS , , Entered By: [name] on 09/21/2021 09:17:57

## 2021-09-21 NOTE — Progress Notes (Addendum)
Antonio [name], Antonio [name] ([id number]) Visit Report for 09/21/2021 Arrival Information Details Patient Name: Date of Service: Antonio [name], Antonio [name] 09/21/2021 7:30 A M Medical Record Number: [id number] Patient Account Number: [id number] Date of Birth/Sex: Treating RN: June 15, 1946 (75 y.o. M) Primary Care Winfred Iiams: [name] Other Clinician: Referring Kaelynn Igo: Treating Trenisha Lafavor/Extender: Krista Blue in Treatment: 6 Visit Information History Since Last Visit All ordered tests and consults were completed: Yes Patient Arrived: Ambulatory Added or deleted any medications: No Arrival Time: 07:20 Any new allergies or adverse reactions: No Accompanied By: self Had a fall or experienced change in No Transfer Assistance: None activities of daily living that may affect Patient Identification Verified: Yes risk of falls: Secondary Verification Process Completed: Yes Signs or symptoms of abuse/neglect since last visito No Patient Requires Transmission-Based Precautions: No Hospitalized since last visit: No Patient Has Alerts: No Implantable device outside of the clinic excluding No cellular tissue based products placed in the center since last visit: Pain Present Now: No Electronic Signature(s) Signed: 09/21/2021 9:15:21 AM By: [name] CHT EMT BS , , Entered By: [name] on 09/21/2021 09:15:21 -------------------------------------------------------------------------------- Encounter Discharge Information Details Patient Name: Date of Service: Antonio [place] MA S A. 09/21/2021 7:30 A M Medical Record Number: [id number] Patient Account Number: [id number] Date of Birth/Sex: Treating RN: 08-13-1946 (75 y.o. [name] Primary Care Laketa Sandoz: [name] Other Clinician: [name] Referring Paolo Okane: Treating Hurbert Duran/Extender: Krista Blue in Treatment: 6 Encounter Discharge Information Items Discharge  Condition: Stable Ambulatory Status: Ambulatory Discharge Destination: Home Transportation: Private Auto Accompanied By: self Schedule Follow-up Appointment: No Clinical Summary of Care: Electronic Signature(s) Signed: 09/21/2021 11:16:38 AM By: [name] CHT EMT BS , , Entered By: [name] on 09/21/2021 11:16:38 -------------------------------------------------------------------------------- Vitals Details Patient Name: Date of Service: Antonio Baltimore MA S A. 09/21/2021 7:30 A M Medical Record Number: [id number] Patient Account Number: [id number] Date of Birth/Sex: Treating RN: 03-27-1946 (75 y.o. [name] Primary Care Rosario Kushner: [name] Other Clinician: [name] Referring Chassidy Layson: Treating Evangelos Paulino/Extender: Krista Blue in Treatment: 6 Vital Signs Time Taken: 07:40 Temperature (F): 98.5 Height (in): 67 Pulse (bpm): 77 Weight (lbs): 170 Respiratory Rate (breaths/min): 16 Body Mass Index (BMI): 26.6 Blood Pressure (mmHg): 133/71 Capillary Blood Glucose (mg/dl): 197 Reference Range: 80 - 120 mg / dl Electronic Signature(s) Signed: 09/21/2021 9:16:57 AM By: [name] CHT EMT BS , , Entered By: [name] on 09/21/2021 09:16:56

## 2021-09-21 NOTE — Progress Notes (Signed)
CRECENCIO, KWIATEK ([id number]) Visit Report for 09/21/2021 SuperBill Details Patient Name: Date of Service: Antonio [name], Antonio [name] [place] 09/21/2021 Medical Record Number: [id number] Patient Account Number: [id number] Date of Birth/Sex: Treating RN: Aug 06, 1946 (75 y.o. 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

## 2021-09-24 ENCOUNTER — Encounter (HOSPITAL_BASED_OUTPATIENT_CLINIC_OR_DEPARTMENT_OTHER): Payer: Medicare Other | Admitting: Internal Medicine

## 2021-09-24 ENCOUNTER — Other Ambulatory Visit: Payer: Self-pay

## 2021-09-24 DIAGNOSIS — R350 Frequency of micturition: Secondary | ICD-10-CM | POA: Diagnosis not present

## 2021-09-24 DIAGNOSIS — Z8546 Personal history of malignant neoplasm of prostate: Secondary | ICD-10-CM

## 2021-09-24 DIAGNOSIS — N3041 Irradiation cystitis with hematuria: Secondary | ICD-10-CM | POA: Diagnosis not present

## 2021-09-24 LAB — GLUCOSE, CAPILLARY
Glucose-Capillary: 209 mg/dL — ABNORMAL HIGH (ref 70–99)
Glucose-Capillary: 85 mg/dL (ref 70–99)

## 2021-09-24 NOTE — Progress Notes (Signed)
SABEN, DONIGAN ([id number]) Visit Report for 09/24/2021 SuperBill Details Patient Name: Date of Service: Antonio [name], Antonio [name] [place] 09/24/2021 Medical Record Number: [id number] Patient Account Number: [id number] Date of Birth/Sex: Treating RN: 1946-04-10 (75 y.o. 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

## 2021-09-24 NOTE — Progress Notes (Addendum)
Antonio, [name] ([id number]) Visit Report for 09/24/2021 Arrival Information Details Patient Name: Date of Service: Antonio, [name] [name] 09/24/2021 9:00 A M Medical Record Number: [id number] Patient Account Number: [id number] Date of Birth/Sex: Treating RN: [birth year] (75 y.o. [name], [name] Primary Care Carrianne Hyun: [name] Other Clinician: [name] Referring Modesta Sammons: Treating Laci Frenkel/Extender: Krista Blue in Treatment: 6 Visit Information History Since Last Visit All ordered tests and consults were completed: Yes Patient Arrived: Ambulatory Added or deleted any medications: No Arrival Time: 07:35 Any new allergies or adverse reactions: No Accompanied By: self Had a fall or experienced change in No Transfer Assistance: None activities of daily living that may affect Patient Identification Verified: Yes risk of falls: Secondary Verification Process Completed: Yes Signs or symptoms of abuse/neglect since last visito No Patient Requires Transmission-Based Precautions: No Hospitalized since last visit: No Patient Has Alerts: No Implantable device outside of the clinic excluding No cellular tissue based products placed in the center since last visit: Pain Present Now: No Electronic Signature(s) Signed: 09/24/2021 9:35:54 AM By: [name] CHT EMT BS , , Entered By: [name] on 09/24/2021 09:35:54 -------------------------------------------------------------------------------- Encounter Discharge Information Details Patient Name: Date of Service: Antonio Baltimore MA S A. 09/24/2021 9:00 A M Medical Record Number: [id number] Patient Account Number: [id number] Date of Birth/Sex: Treating RN: July 22, 1946 (75 y.o. [name] Primary Care Siniyah Evangelist: [name] Other Clinician: [name] Referring Aldora Perman: Treating Blayn Whetsell/Extender: Krista Blue in Treatment: 6 Encounter  Discharge Information Items Discharge Condition: Stable Ambulatory Status: Ambulatory Discharge Destination: Home Transportation: Private Auto Accompanied By: self Schedule Follow-up Appointment: No Clinical Summary of Care: Electronic Signature(s) Signed: 09/24/2021 11:20:30 AM By: [name] CHT EMT BS , , Entered By: [name] on 09/24/2021 11:20:29 -------------------------------------------------------------------------------- Vitals Details Patient Name: Date of Service: Antonio Baltimore MA S A. 09/24/2021 9:00 A M Medical Record Number: [id number] Patient Account Number: [id number] Date of Birth/Sex: Treating RN: Nov 29, 1945 (75 y.o. [name], [name] Primary Care Fannie Gathright: [name] Other Clinician: [name] Referring Rainna Nearhood: Treating Carletha Dawn/Extender: Krista Blue in Treatment: 6 Vital Signs Time Taken: 07:50 Temperature (F): 98.7 Height (in): 67 Pulse (bpm): 76 Weight (lbs): 170 Respiratory Rate (breaths/min): 16 Body Mass Index (BMI): 26.6 Blood Pressure (mmHg): 111/71 Capillary Blood Glucose (mg/dl): 209 Reference Range: 80 - 120 mg / dl Electronic Signature(s) Signed: 09/24/2021 9:36:35 AM By: [name] CHT EMT BS , , Entered By: [name] on 09/24/2021 09:36:35

## 2021-09-24 NOTE — Progress Notes (Addendum)
Antonio [name] ([id number]) Visit Report for 09/24/2021 HBO Details Patient Name: Date of Service: Antonio [name], Antonio [name] [name] 09/24/2021 9:00 A M Medical Record Number: [id number] Patient Account Number: [id number] Date of Birth/Sex: Treating RN: 17-Aug-1946 (75 y.o. Antonio [name], Antonio [name] Primary Care Antonio [name]: Antonio [name] Other Clinician: Donavan [name] Referring Antonio [name]: Treating Antonio [name]/Extender: Antonio [name] in Treatment: 6 HBO Treatment Course Details Treatment Course Number: 1 Ordering Antonio [name]: Antonio [name] Treatments Ordered: otal 40 HBO Treatment Start Date: 08/23/2021 HBO Indication: Late Effect of Radiation HBO Treatment Details Treatment Number: 20 Patient Type: Outpatient Chamber Type: Monoplace Chamber Serial #: [id number] Treatment Protocol: 2.0 ATA with 90 minutes oxygen, and no air breaks Treatment Details Compression Rate Down: 2.0 psi / minute De-Compression Rate Up: 2.0 psi / minute Air breaks and breathing Decompress Decompress Compress Tx Pressure Begins Reached periods Begins Ends (leave unused spaces blank) Chamber Pressure (ATA 1 2 ------2 1 ) Clock Time (24 hr) 08:35 08:43 - - - - - - 10:13 10:21 Treatment Length: 106 (minutes) Treatment Segments: 4 Vital Signs Capillary Blood Glucose Reference Range: 80 - 120 mg / dl HBO Diabetic Blood Glucose Intervention Range: <131 mg/dl or >249 mg/dl Type: Time Vitals Blood Pulse: Respiratory Temperature: Capillary Blood Glucose Pulse Action Taken: Pressure: Rate: Glucose (mg/dl): Meter #: Oximetry (%) Taken: Pre 07:50 111/71 76 16 98.7 209 Post 10:23 144/90 71 16 98.4 85 8 oz Glucerna Shake given, wait 15 minutes Treatment Response Treatment Toleration: Well Treatment Completion Status: Treatment Completed without Adverse Event Treatment Notes Patient's blood glucose was 85 mg/dL post-treatment. Patient was given 8 oz Glucerna, waited 15 minutes. Patient was asymptomatic  for hypoglycemia. Physician HBO Attestation: I certify that I supervised this HBO treatment in accordance with Medicare guidelines. A trained emergency response team is readily available per Yes hospital policies and procedures. Continue HBOT as ordered. Yes Electronic Signature(s) Signed: 09/24/2021 5:44:46 PM By: Antonio Shan DO Previous Signature: 09/24/2021 11:19:36 AM Version By: Antonio [name] CHT EMT BS , , Entered By: Antonio [name] on 09/24/2021 17:43:33 -------------------------------------------------------------------------------- HBO Safety Checklist Details Patient Name: Date of Service: Antonio Baltimore Antonio S A. 09/24/2021 9:00 A M Medical Record Number: [id number] Patient Account Number: [id number] Date of Birth/Sex: Treating RN: 1946-08-27 (75 y.o. Antonio [name], Antonio [name] Primary Care Antonio [name]: Antonio [name] Other Clinician: Donavan [name] Referring Antonio [name]: Treating Antonio [name]/Extender: Antonio [name] in Treatment: 6 HBO Safety Checklist Items Safety Checklist Consent Form Signed Patient voided / foley secured and emptied When did you last eato 0630 Last dose of injectable or oral agent 0630 Ostomy pouch emptied and vented if applicable NA All implantable devices assessed, documented and approved NA Intravenous access site secured and place NA Valuables secured Linens and cotton and cotton/polyester blend (less than 51% polyester) Personal oil-based products / skin lotions / body lotions removed Wigs or hairpieces removed NA Smoking or tobacco materials removed NA Books / newspapers / magazines / loose paper removed Cologne, aftershave, perfume and deodorant removed Jewelry removed (may wrap wedding band) Make-up removed NA Hair care products removed Battery operated devices (external) removed Heating patches and chemical warmers removed Titanium eyewear removed NA Nail polish cured greater than 10 hours NA Casting  material cured greater than 10 hours NA Hearing aids removed NA Loose dentures or partials removed dentures removed Prosthetics have been removed NA Patient demonstrates correct use of air break device (if applicable) Patient concerns have been addressed Patient grounding bracelet on and cord attached to chamber Specifics  for Inpatients (complete in addition to above) Medication sheet sent with patient NA Intravenous medications needed or due during therapy sent with patient NA Drainage tubes (e.g. nasogastric tube or chest tube secured and vented) NA Endotracheal or Tracheotomy tube secured NA Cuff deflated of air and inflated with saline NA Airway suctioned NA Notes Paper version used prior to treatment. Electronic Signature(s) Signed: 09/24/2021 11:14:37 AM By: Antonio [name] CHT EMT BS , , Previous Signature: 09/24/2021 9:37:41 AM Version By: Antonio [name] CHT EMT BS , , Entered By: Antonio [name] on 09/24/2021 11:14:36

## 2021-09-25 ENCOUNTER — Encounter (HOSPITAL_BASED_OUTPATIENT_CLINIC_OR_DEPARTMENT_OTHER): Payer: Medicare Other | Admitting: Internal Medicine

## 2021-09-25 DIAGNOSIS — N3041 Irradiation cystitis with hematuria: Secondary | ICD-10-CM | POA: Diagnosis not present

## 2021-09-25 LAB — GLUCOSE, CAPILLARY
Glucose-Capillary: 183 mg/dL — ABNORMAL HIGH (ref 70–99)
Glucose-Capillary: 191 mg/dL — ABNORMAL HIGH (ref 70–99)

## 2021-09-25 NOTE — Progress Notes (Addendum)
EURAL, HOLZSCHUH ([id number]) Visit Report for 09/25/2021 Arrival Information Details Patient Name: Date of Service: Antonio [name], Antonio [name] 09/25/2021 7:30 A M Medical Record Number: [id number] Patient Account Number: [id number] Date of Birth/Sex: Treating RN: 12/05/1945 (75 y.o. [name], [name] Primary Care Brodi Kari: [name] Other Clinician: [name] Referring Jacara Benito: Treating Jennene Downie/Extender: [name] in Treatment: 6 Visit Information History Since Last Visit All ordered tests and consults were completed: Yes Patient Arrived: Ambulatory Added or deleted any medications: No Arrival Time: 07:37 Any new allergies or adverse reactions: No Accompanied By: self Had a fall or experienced change in No Transfer Assistance: None activities of daily living that may affect Patient Identification Verified: Yes risk of falls: Secondary Verification Process Completed: Yes Signs or symptoms of abuse/neglect since last visito No Patient Requires Transmission-Based Precautions: No Hospitalized since last visit: No Patient Has Alerts: No Implantable device outside of the clinic excluding No cellular tissue based products placed in the center since last visit: Pain Present Now: No Electronic Signature(s) Signed: 09/25/2021 8:37:04 AM By: [name] CHT EMT BS , , Entered By: [name] on 09/25/2021 08:37:04 -------------------------------------------------------------------------------- Encounter Discharge Information Details Patient Name: Date of Service: Antonio Baltimore MA S A. 09/25/2021 7:30 A M Medical Record Number: [id number] Patient Account Number: [id number] Date of Birth/Sex: Treating RN: 08-Sep-1946 (75 y.o. Antonio [name] Primary Care Lasalle Abee: [name] Other Clinician: [name] Referring Justinian Miano: Treating Gurkaran Rahm/Extender: [name] in Treatment: 6 Encounter  Discharge Information Items Discharge Condition: Stable Ambulatory Status: Ambulatory Discharge Destination: Home Transportation: Private Auto Accompanied By: self Schedule Follow-up Appointment: No Clinical Summary of Care: Electronic Signature(s) Signed: 09/25/2021 11:23:35 AM By: [name] CHT EMT BS , , Entered By: [name] on 09/25/2021 11:23:35 -------------------------------------------------------------------------------- Vitals Details Patient Name: Date of Service: Antonio Baltimore MA S A. 09/25/2021 7:30 A M Medical Record Number: [id number] Patient Account Number: [id number] Date of Birth/Sex: Treating RN: 02/18/1946 (75 y.o. Antonio [name] Primary Care Ruari Mudgett: [name] Other Clinician: [name] Referring Rien Marland: Treating Toria Monte/Extender: [name] in Treatment: 6 Vital Signs Time Taken: 07:45 Temperature (F): 98.2 Height (in): 67 Pulse (bpm): 69 Weight (lbs): 170 Respiratory Rate (breaths/min): 16 Body Mass Index (BMI): 26.6 Blood Pressure (mmHg): 128/66 Capillary Blood Glucose (mg/dl): 183 Reference Range: 80 - 120 mg / dl Electronic Signature(s) Signed: 09/25/2021 11:20:17 AM By: [name] CHT EMT BS , , Previous Signature: 09/25/2021 8:37:35 AM Version By: [name] CHT EMT BS , , Entered By: [name] on 09/25/2021 11:20:17

## 2021-09-25 NOTE — Progress Notes (Addendum)
Antonio, [name] ([id number]) Visit Report for 09/25/2021 HBO Details Patient Name: Date of Service: Antonio [name], Antonio [name] [name] 09/25/2021 7:30 A M Medical Record Number: [id number] Patient Account Number: [id number] Date of Birth/Sex: Treating RN: 1946-05-19 (75 y.o. Antonio [name] Primary Care Antonio [name]: Antonio [name] Other Clinician: Donavan [name] Referring Antonio [name]: Treating Nikash Mortensen/Extender: Antonio [name] in Treatment: 6 HBO Treatment Course Details Treatment Course Number: 1 Ordering Antonio [name]: Antonio [name] Treatments Ordered: otal 40 HBO Treatment Start Date: 08/23/2021 HBO Indication: Late Effect of Radiation HBO Treatment Details Treatment Number: 21 Patient Type: Outpatient Chamber Type: Monoplace Chamber Serial #: [id number] Treatment Protocol: 2.0 ATA with 90 minutes oxygen, and no air breaks Treatment Details Compression Rate Down: 2.0 psi / minute De-Compression Rate Up: 2.0 psi / minute Air breaks and breathing Decompress Decompress Compress Tx Pressure Begins Reached periods Begins Ends (leave unused spaces blank) Chamber Pressure (ATA 1 2 ------2 1 ) Clock Time (24 hr) 08:05 08:16 - - - - - - 09:46 09:54 Treatment Length: 109 (minutes) Treatment Segments: 4 Vital Signs Capillary Blood Glucose Reference Range: 80 - 120 mg / dl HBO Diabetic Blood Glucose Intervention Range: <131 mg/dl or >249 mg/dl Time Vitals Blood Respiratory Capillary Blood Glucose Pulse Action Type: Pulse: Temperature: Taken: Pressure: Rate: Glucose (mg/dl): Meter #: Oximetry (%) Taken: Pre 07:45 128/66 69 16 98.2 183 2 Post 09:56 138/82 70 16 98.2 191 2 Treatment Response Treatment Toleration: Well Treatment Completion Status: Treatment Completed without Adverse Event Antonio [name] Notes No concerns with treatment given Physician HBO Attestation: I certify that I supervised this HBO treatment in accordance with Medicare guidelines. A trained  emergency response team is readily available per Yes hospital policies and procedures. Continue HBOT as ordered. Yes Electronic Signature(s) Signed: 09/25/2021 4:30:32 PM By: Antonio Ham MD Previous Signature: 09/25/2021 11:22:12 AM Version By: Antonio [name] CHT EMT BS , , Entered By: Antonio [name] on 09/25/2021 16:29:51 -------------------------------------------------------------------------------- HBO Safety Checklist Details Patient Name: Date of Service: Antonio Baltimore MA S A. 09/25/2021 7:30 A M Medical Record Number: [id number] Patient Account Number: [id number] Date of Birth/Sex: Treating RN: 31-Mar-1946 (75 y.o. Antonio [name] Primary Care Arash Karstens: Antonio [name] Other Clinician: Donavan [name] Referring Falicia Lizotte: Treating Kashawn Manzano/Extender: Antonio [name] in Treatment: 6 HBO Safety Checklist Items Safety Checklist Consent Form Signed Patient voided / foley secured and emptied When did you last eato 0630 Last dose of injectable or oral agent 0631 Ostomy pouch emptied and vented if applicable NA All implantable devices assessed, documented and approved NA Intravenous access site secured and place NA Valuables secured Linens and cotton and cotton/polyester blend (less than 51% polyester) Personal oil-based products / skin lotions / body lotions removed Wigs or hairpieces removed NA Smoking or tobacco materials removed NA Books / newspapers / magazines / loose paper removed Cologne, aftershave, perfume and deodorant removed Jewelry removed (may wrap wedding band) Make-up removed NA Hair care products removed Battery operated devices (external) removed Heating patches and chemical warmers removed Titanium eyewear removed NA Nail polish cured greater than 10 hours NA Casting material cured greater than 10 hours NA Hearing aids removed NA Loose dentures or partials removed dentures removed Prosthetics have been  removed NA Patient demonstrates correct use of air break device (if applicable) Patient concerns have been addressed Patient grounding bracelet on and cord attached to chamber Specifics for Inpatients (complete in addition to above) Medication sheet sent with patient NA Intravenous medications needed or due during therapy sent with  patient NA Drainage tubes (e.g. nasogastric tube or chest tube secured and vented) NA Endotracheal or Tracheotomy tube secured NA Cuff deflated of air and inflated with saline NA Airway suctioned NA Notes Paper version used prior to treatment. Electronic Signature(s) Signed: 09/25/2021 11:22:39 AM By: Antonio [name] CHT EMT BS , , Previous Signature: 09/25/2021 8:38:46 AM Version By: Antonio [name] CHT EMT BS , , Entered By: Antonio [name] on 09/25/2021 11:22:39

## 2021-09-25 NOTE — Progress Notes (Signed)
[name], [name] ([id number]) Visit Report for 09/25/2021 SuperBill Details Patient Name: Date of Service: Antonio [name], Antonio [name] [place] 09/25/2021 Medical Record Number: [id number] Patient Account Number: [id number] Date of Birth/Sex: Treating RN: [birth year] (75 y.o. 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

## 2021-09-26 ENCOUNTER — Other Ambulatory Visit: Payer: Self-pay

## 2021-09-26 ENCOUNTER — Encounter (HOSPITAL_BASED_OUTPATIENT_CLINIC_OR_DEPARTMENT_OTHER): Payer: Medicare Other | Admitting: Physician Assistant

## 2021-09-26 DIAGNOSIS — N3041 Irradiation cystitis with hematuria: Secondary | ICD-10-CM | POA: Diagnosis not present

## 2021-09-26 LAB — GLUCOSE, CAPILLARY
Glucose-Capillary: 212 mg/dL — ABNORMAL HIGH (ref 70–99)
Glucose-Capillary: 181 mg/dL — ABNORMAL HIGH (ref 70–99)

## 2021-09-26 NOTE — Progress Notes (Addendum)
DOMNICK, CHERVENAK ([id number]) Visit Report for 09/26/2021 Arrival Information Details Patient Name: Date of Service: FELIPE, PALUCH [name] 09/26/2021 7:30 A M Medical Record Number: [id number] Patient Account Number: [id number] Date of Birth/Sex: Treating RN: September 13, 1946 (75 y.o. [name], [name] Primary Care Wretha Laris: [name] Other Clinician: [name] Referring Jahleah Mariscal: Treating Dashea Mcmullan/Extender: [name] in Treatment: 6 Visit Information History Since Last Visit All ordered tests and consults were completed: Yes Patient Arrived: Ambulatory Added or deleted any medications: No Arrival Time: 07:37 Any new allergies or adverse reactions: No Accompanied By: self Had a fall or experienced change in No Transfer Assistance: None activities of daily living that may affect Patient Identification Verified: Yes risk of falls: Secondary Verification Process Completed: Yes Signs or symptoms of abuse/neglect since last visito No Patient Requires Transmission-Based Precautions: No Hospitalized since last visit: No Patient Has Alerts: No Implantable device outside of the clinic excluding No cellular tissue based products placed in the center since last visit: Pain Present Now: No Electronic Signature(s) Signed: 09/26/2021 9:20:32 AM By: [name] CHT EMT BS , , Entered By: [name] on 09/26/2021 09:20:32 -------------------------------------------------------------------------------- Encounter Discharge Information Details Patient Name: Date of Service: [name] 09/26/2021 7:30 A M Medical Record Number: [id number] Patient Account Number: [id number] Date of Birth/Sex: Treating RN: [birth year] (75 y.o. [name] Primary Care Janathan Bribiesca: [name] Other Clinician: [name] Referring Yvonda Fouty: Treating Keslee Harrington/Extender: [name] in Treatment: 6 Encounter  Discharge Information Items Discharge Condition: Stable Ambulatory Status: Ambulatory Discharge Destination: Home Transportation: Private Auto Accompanied By: self Schedule Follow-up Appointment: No Clinical Summary of Care: Electronic Signature(s) Signed: 09/26/2021 11:25:25 AM By: [name] CHT EMT BS , , Entered By: [name] on 09/26/2021 11:25:25 -------------------------------------------------------------------------------- Vitals Details Patient Name: Date of Service: [name] 09/26/2021 7:30 A M Medical Record Number: [id number] Patient Account Number: [id number] Date of Birth/Sex: Treating RN: 06-21-1946 (75 y.o. [name] Primary Care Jalon Blackwelder: [name] Other Clinician: [name] Referring Clancy Mullarkey: Treating Adrena Nakamura/Extender: [name] in Treatment: 6 Vital Signs Time Taken: 07:43 Temperature (F): 98.2 Height (in): 67 Pulse (bpm): 78 Weight (lbs): 170 Respiratory Rate (breaths/min): 18 Body Mass Index (BMI): 26.6 Blood Pressure (mmHg): 131/69 Capillary Blood Glucose (mg/dl): 212 Reference Range: 80 - 120 mg / dl Electronic Signature(s) Signed: 09/26/2021 9:21:12 AM By: [name] CHT EMT BS , , Entered By: [name] on 09/26/2021 09:21:12

## 2021-09-26 NOTE — Progress Notes (Addendum)
Antonio [name], Antonio [name] ([id number]) Visit Report for 09/26/2021 SuperBill Details Patient Name: Date of Service: Antonio [name], Antonio [name] [place] 09/26/2021 Medical Record Number: [id number] Patient Account Number: [id number] Date of Birth/Sex: Treating RN: [birth year] (75 y.o. 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

## 2021-09-26 NOTE — Progress Notes (Addendum)
RAFIQ, BUCKLIN ([id number]) Visit Report for 09/26/2021 HBO Details Patient Name: Date of Service: Antonio [name], Antonio [name] [name]. 09/26/2021 7:30 [name] Medical Record Number: [id number] Patient Account Number: [id number] Date of Birth/Sex: Treating RN: [birth year] (75 y.o. Antonio [name] Primary Care Antonio [name]: Antonio [name] Other Clinician: Donavan [name] Referring Antonio [name]: Treating Antonio [name]/Extender: Antonio [name] in Treatment: 6 HBO Treatment Course Details Treatment Course Number: 1 Ordering Antonio [name]: Antonio [name] T Treatments Ordered: otal 40 HBO Treatment Start Date: 08/23/2021 HBO Indication: Late Effect of Radiation HBO Treatment Details Treatment Number: 22 Patient Type: Outpatient Chamber Type: Monoplace Chamber Serial #: [id number] Treatment Protocol: 2.0 ATA with 90 minutes oxygen, and no air breaks Treatment Details Compression Rate Down: 2.0 psi / minute De-Compression Rate Up: 2.0 psi / minute Air breaks and breathing Decompress Decompress Compress Tx Pressure Begins Reached periods Begins Ends (leave unused spaces blank) Chamber Pressure (ATA 1 2 ------2 1 ) Clock Time (24 hr) 08:11 08:19 - - - - - - 09:49 9:57 Treatment Length: 106 (minutes) Treatment Segments: 4 Vital Signs Capillary Blood Glucose Reference Range: 80 - 120 mg / dl HBO Diabetic Blood Glucose Intervention Range: <131 mg/dl or >249 mg/dl Time Vitals Blood Respiratory Capillary Blood Glucose Pulse Action Type: Pulse: Temperature: Taken: Pressure: Rate: Glucose (mg/dl): Meter #: Oximetry (%) Taken: Pre 07:43 131/69 78 18 98.2 212 2 Post 09:47 139/84 70 18 98.3 181 2 Treatment Response Treatment Toleration: Well Treatment Completion Status: Treatment Completed without Adverse Event Electronic Signature(s) Signed: 10/01/2021 3:17:52 PM By: Antonio [name] CHT EMT BS , , Signed: 10/24/2021 5:05:54 PM By: [name] Previous Signature:  09/26/2021 11:23:41 AM Version By: Antonio [name] CHT EMT BS , , Previous Signature: 09/26/2021 3:33:06 PM Version By: [name] Entered By: Antonio [name] on 10/01/2021 15:14:18 -------------------------------------------------------------------------------- HBO Safety Checklist Details Patient Name: Date of Service: Antonio [name]. 09/26/2021 7:30 [name] Medical Record Number: [id number] Patient Account Number: [id number] Date of Birth/Sex: Treating RN: [birth year] (75 y.o. Antonio [name] Primary Care Antonio [name]: Antonio [name] Other Clinician: Donavan [name] Referring Antonio [name]: Treating Antonio [name]/Extender: Antonio [name] in Treatment: 6 HBO Safety Checklist Items Safety Checklist Consent Form Signed Patient voided / foley secured and emptied When did you last eato 0630 Last dose of injectable or oral agent 0631 Ostomy pouch emptied and vented if applicable NA All implantable devices assessed, documented and approved NA Intravenous access site secured and place NA Valuables secured Linens and cotton and cotton/polyester blend (less than 51% polyester) Personal oil-based products / skin lotions / body lotions removed Wigs or hairpieces removed NA Smoking or tobacco materials removed NA Books / newspapers / magazines / loose paper removed Cologne, aftershave, perfume and deodorant removed Jewelry removed (may wrap wedding band) Make-up removed NA Hair care products removed Battery operated devices (external) removed Heating patches and chemical warmers removed Titanium eyewear removed NA Nail polish cured greater than 10 hours NA Casting material cured greater than 10 hours NA Hearing aids removed NA Loose dentures or partials removed dentures removed Prosthetics have been removed NA Patient demonstrates correct use of air break device (if applicable) Patient concerns have been addressed Patient grounding bracelet  on and cord attached to chamber Specifics for Inpatients (complete in addition to above) Medication sheet sent with patient NA Intravenous medications needed or due during therapy sent with patient NA Drainage tubes (e.g. nasogastric tube or chest tube secured and vented) NA Endotracheal or  Tracheotomy tube secured NA Cuff deflated of air and inflated with saline NA Airway suctioned NA Notes Paper version used prior to treatment. Electronic Signature(s) Signed: 09/26/2021 11:19:14 AM By: Antonio [name] CHT EMT BS , , Previous Signature: 09/26/2021 9:22:27 AM Version By: Antonio [name] CHT EMT BS , , Entered By: Antonio [name] on 09/26/2021 11:19:13

## 2021-09-27 ENCOUNTER — Encounter (HOSPITAL_BASED_OUTPATIENT_CLINIC_OR_DEPARTMENT_OTHER): Payer: Medicare Other | Admitting: Internal Medicine

## 2021-09-27 DIAGNOSIS — N3041 Irradiation cystitis with hematuria: Secondary | ICD-10-CM | POA: Diagnosis not present

## 2021-09-27 LAB — GLUCOSE, CAPILLARY
Glucose-Capillary: 217 mg/dL — ABNORMAL HIGH (ref 70–99)
Glucose-Capillary: 96 mg/dL (ref 70–99)

## 2021-09-27 NOTE — Progress Notes (Signed)
[name], HAM ([id number]) Visit Report for 09/27/2021 Arrival Information Details Patient Name: Date of Service: [name], [name] [name]. 09/27/2021 9:00 [name] Medical Record Number: [id number] Patient Account Number: [id number] Date of Birth/Sex: Treating RN: [birth year] (75 y.o. Antonio [name], [name] Primary Care Berthe Oley: [name] Other Clinician: [name] Referring Ariannah Arenson: Treating Annette Bertelson/Extender: [name] in Treatment: 7 Visit Information History Since Last Visit All ordered tests and consults were completed: Yes Patient Arrived: Ambulatory Added or deleted any medications: No Arrival Time: 07:39 Any new allergies or adverse reactions: No Accompanied By: self Had [name] fall or experienced change in No Transfer Assistance: None activities of daily living that may affect Patient Identification Verified: Yes risk of falls: Secondary Verification Process Completed: Yes Signs or symptoms of abuse/neglect since last visito No Patient Requires Transmission-Based Precautions: No Hospitalized since last visit: No Patient Has Alerts: No Implantable device outside of the clinic excluding No cellular tissue based products placed in the center since last visit: Pain Present Now: No Electronic Signature(s) Signed: 09/27/2021 11:31:45 AM By: [name] CHT EMT BS , , Entered By: [name] on 09/27/2021 11:31:44 -------------------------------------------------------------------------------- Encounter Discharge Information Details Patient Name: Date of Service: Antonio [name]. 09/27/2021 9:00 [name] Medical Record Number: [id number] Patient Account Number: [id number] Date of Birth/Sex: Treating RN: 03-12-1946 (75 y.o. Antonio [name] Primary Care Babette Stum: [name] Other Clinician: [name] Referring Eliyanah Elgersma: Treating Savhanna Sliva/Extender: [name] in Treatment: 7 Encounter  Discharge Information Items Discharge Condition: Stable Ambulatory Status: Ambulatory Discharge Destination: Home Transportation: Private Auto Accompanied By: self Schedule Follow-up Appointment: No Clinical Summary of Care: Electronic Signature(s) Signed: 09/27/2021 12:04:06 PM By: [name] CHT EMT BS , , Entered By: [name] on 09/27/2021 12:04:05 -------------------------------------------------------------------------------- Vitals Details Patient Name: Date of Service: Antonio [name]. 09/27/2021 9:00 [name] Medical Record Number: [id number] Patient Account Number: [id number] Date of Birth/Sex: Treating RN: 02-03-1946 (75 y.o. Antonio [name], Antonio [name] Primary Care Damyia Strider: [name] Other Clinician: [name] Referring Syre Knerr: Treating Veronica Fretz/Extender: [name] in Treatment: 7 Vital Signs Time Taken: 07:47 Temperature (F): 98.7 Height (in): 67 Pulse (bpm): 70 Weight (lbs): 170 Respiratory Rate (breaths/min): 16 Body Mass Index (BMI): 26.6 Blood Pressure (mmHg): 121/73 Capillary Blood Glucose (mg/dl): 217 Reference Range: 80 - 120 mg / dl Electronic Signature(s) Signed: 09/27/2021 11:32:44 AM By: [name] CHT EMT BS , , Entered By: [name] on 09/27/2021 11:32:44

## 2021-09-27 NOTE — Progress Notes (Addendum)
Antonio [name], Antonio [name] ([id number]) Visit Report for 09/27/2021 HBO Details Patient Name: Date of Service: Antonio [name], Antonio [name] [name]. 09/27/2021 9:00 [name] Medical Record Number: [id number] Patient Account Number: [id number] Date of Birth/Sex: Treating RN: 29-Sep-1946 (75 y.o. Antonio [name], Antonio [name] Primary Care Antonio [name]: Antonio [name] Other Clinician: [name] Referring Malaika Arnall: Treating Alisse Tuite/Extender: [name] in Treatment: 7 HBO Treatment Course Details Treatment Course Number: 1 Ordering Solymar Grace: [name] T Treatments Ordered: otal 40 HBO Treatment Start Date: 08/23/2021 HBO Indication: Late Effect of Radiation HBO Treatment Details Treatment Number: 23 Patient Type: Outpatient Chamber Type: Monoplace Chamber Serial #: [id number] Treatment Protocol: 2.0 ATA with 90 minutes oxygen, and no air breaks Treatment Details Compression Rate Down: 2.0 psi / minute De-Compression Rate Up: 2.0 psi / minute Air breaks and breathing Decompress Decompress Compress Tx Pressure Begins Reached periods Begins Ends (leave unused spaces blank) Chamber Pressure (ATA 1 2 ------2 1 ) Clock Time (24 hr) 08:08 08:16 - - - - - - 09:46 09:54 Treatment Length: 106 (minutes) Treatment Segments: 4 Vital Signs Capillary Blood Glucose Reference Range: 80 - 120 mg / dl HBO Diabetic Blood Glucose Intervention Range: <131 mg/dl or >249 mg/dl Type: Time Vitals Blood Pulse: Respiratory Capillary Blood Glucose Pulse Action Temperature: Taken: Pressure: Rate: Glucose (mg/dl): Meter #: Oximetry (%) Taken: Pre 07:47 121/73 70 16 98.7 217 2 Post 09:59 144/88 72 16 98.6 96 2 8 oz Glucerna Shake given Treatment Response Treatment Toleration: Well Treatment Completion Status: Treatment Completed without Adverse Event Treatment Notes Patient's blood glucose level was 96 mg/dL after treatment. Patient was given 8oz Glucerna shake and after 15 minutes patient was  asymptomatic for hypoglycemia and was, therefore, discharged per protocol. Biviana Saddler Notes No concerns with treatment given Physician HBO Attestation: I certify that I supervised this HBO treatment in accordance with Medicare guidelines. [name] trained emergency response team is readily available per Yes hospital policies and procedures. Continue HBOT as ordered. Yes Electronic Signature(s) Signed: 09/27/2021 4:39:12 PM By: [name] MD Previous Signature: 09/27/2021 11:59:44 AM Version By: [name] CHT EMT BS , , Previous Signature: 09/27/2021 11:59:44 AM Version By: [name] CHT EMT BS , , Previous Signature: 09/27/2021 11:59:07 AM Version By: [name] CHT EMT BS , , Entered By: [name] on 09/27/2021 16:01:38 -------------------------------------------------------------------------------- HBO Safety Checklist Details Patient Name: Date of Service: Antonio [name]. 09/27/2021 9:00 [name] Medical Record Number: [id number] Patient Account Number: [id number] Date of Birth/Sex: Treating RN: 01-11-1946 (75 y.o. Antonio [name], Antonio [name] Primary Care Antonio [name]: Antonio [name] Other Clinician: [name] Referring Antonio [name]: Treating Patric Vanpelt/Extender: [name] in Treatment: 7 HBO Safety Checklist Items Safety Checklist Consent Form Signed Patient voided / foley secured and emptied When did you last eato 0630 Last dose of injectable or oral agent 0631 Ostomy pouch emptied and vented if applicable NA All implantable devices assessed, documented and approved NA Intravenous access site secured and place NA Valuables secured Linens and cotton and cotton/polyester blend (less than 51% polyester) Personal oil-based products / skin lotions / body lotions removed Wigs or hairpieces removed NA Smoking or tobacco materials removed NA Books / newspapers / magazines / loose paper removed Cologne, aftershave, perfume and  deodorant removed Jewelry removed (may wrap wedding band) Make-up removed NA Hair care products removed Battery operated devices (external) removed Heating patches and chemical warmers removed Titanium eyewear removed NA Nail polish cured greater than 10 hours NA Casting material cured greater than 10  hours NA Hearing aids removed NA Loose dentures or partials removed dentures removed Prosthetics have been removed NA Patient demonstrates correct use of air break device (if applicable) Patient concerns have been addressed Patient grounding bracelet on and cord attached to chamber Specifics for Inpatients (complete in addition to above) Medication sheet sent with patient NA Intravenous medications needed or due during therapy sent with patient NA Drainage tubes (e.g. nasogastric tube or chest tube secured and vented) NA Endotracheal or Tracheotomy tube secured NA Cuff deflated of air and inflated with saline NA Airway suctioned NA Notes Paper version used prior to treatment. Electronic Signature(s) Signed: 09/27/2021 11:49:11 AM By: [name] CHT EMT BS , , Entered By: [name] on 09/27/2021 11:49:10

## 2021-09-27 NOTE — Progress Notes (Signed)
WARDELL, POKORSKI ([id number]) Visit Report for 09/27/2021 SuperBill Details Patient Name: Date of Service: Antonio [name], Antonio [name] [place] 09/27/2021 Medical Record Number: [id number] Patient Account Number: [id number] Date of Birth/Sex: Treating RN: [birth year] (75 y.o. 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

## 2021-09-28 ENCOUNTER — Encounter (HOSPITAL_BASED_OUTPATIENT_CLINIC_OR_DEPARTMENT_OTHER): Payer: Medicare Other | Admitting: Internal Medicine

## 2021-09-28 ENCOUNTER — Other Ambulatory Visit: Payer: Self-pay

## 2021-09-28 DIAGNOSIS — Z8546 Personal history of malignant neoplasm of prostate: Secondary | ICD-10-CM

## 2021-09-28 DIAGNOSIS — R350 Frequency of micturition: Secondary | ICD-10-CM

## 2021-09-28 DIAGNOSIS — N3041 Irradiation cystitis with hematuria: Secondary | ICD-10-CM | POA: Diagnosis not present

## 2021-09-28 LAB — GLUCOSE, CAPILLARY
Glucose-Capillary: 239 mg/dL — ABNORMAL HIGH (ref 70–99)
Glucose-Capillary: 231 mg/dL — ABNORMAL HIGH (ref 70–99)

## 2021-09-28 NOTE — Progress Notes (Addendum)
Antonio [name], Antonio [name] ([id number]) Visit Report for 09/28/2021 Arrival Information Details Patient Name: Date of Service: Antonio [name], Antonio [name] [name] 09/28/2021 7:30 A M Medical Record Number: [id number] Patient Account Number: [id number] Date of Birth/Sex: Treating RN: [birth year] (75 y.o. Antonio [name], Antonio [name] Primary Care Alden Feagan: [name] Other Clinician: [name] Referring Benford Asch: Treating Mead Slane/Extender: Krista Blue in Treatment: 7 Visit Information History Since Last Visit All ordered tests and consults were completed: Yes Patient Arrived: Ambulatory Added or deleted any medications: No Arrival Time: 07:36 Any new allergies or adverse reactions: No Accompanied By: self Had a fall or experienced change in No Transfer Assistance: None activities of daily living that may affect Patient Identification Verified: Yes risk of falls: Secondary Verification Process Completed: Yes Signs or symptoms of abuse/neglect since last visito No Patient Requires Transmission-Based Precautions: No Hospitalized since last visit: No Patient Has Alerts: No Implantable device outside of the clinic excluding No cellular tissue based products placed in the center since last visit: Pain Present Now: No Electronic Signature(s) Signed: 09/28/2021 9:31:46 AM By: [name] CHT EMT BS , , Entered By: [name] on 09/28/2021 09:31:46 -------------------------------------------------------------------------------- Encounter Discharge Information Details Patient Name: Date of Service: Antonio [place] MA S A. 09/28/2021 7:30 A M Medical Record Number: [id number] Patient Account Number: [id number] Date of Birth/Sex: Treating RN: [birth year] (75 y.o. Antonio [name] Primary Care Ariyonna Twichell: [name] Other Clinician: [name] Referring Lucilia Yanni: Treating Freeman Borba/Extender: Krista Blue in Treatment: 7 Encounter  Discharge Information Items Discharge Condition: Stable Ambulatory Status: Ambulatory Discharge Destination: Home Transportation: Private Auto Accompanied By: self Schedule Follow-up Appointment: No Clinical Summary of Care: Electronic Signature(s) Signed: 09/28/2021 11:05:42 AM By: [name] CHT EMT BS , , Entered By: [name] on 09/28/2021 11:05:42 -------------------------------------------------------------------------------- Vitals Details Patient Name: Date of Service: Antonio [place] MA S A. 09/28/2021 7:30 A M Medical Record Number: [id number] Patient Account Number: [id number] Date of Birth/Sex: Treating RN: 05/28/1946 (75 y.o. Antonio [name] Primary Care Sharrell Krawiec: [name] Other Clinician: [name] Referring Brittnay Pigman: Treating Bert Ptacek/Extender: Krista Blue in Treatment: 7 Vital Signs Time Taken: 07:46 Temperature (F): 98.4 Height (in): 67 Pulse (bpm): 66 Weight (lbs): 170 Respiratory Rate (breaths/min): 16 Body Mass Index (BMI): 26.6 Blood Pressure (mmHg): 127/73 Capillary Blood Glucose (mg/dl): 239 Reference Range: 80 - 120 mg / dl Electronic Signature(s) Signed: 09/28/2021 9:34:04 AM By: [name] CHT EMT BS , , Entered By: [name] on 09/28/2021 09:34:04

## 2021-09-28 NOTE — Progress Notes (Addendum)
KEIDRICK, MURTY ([id number]) Visit Report for 09/28/2021 HBO Details Patient Name: Date of Service: JAYSEN, WEY [name] 09/28/2021 7:30 A M Medical Record Number: [id number] Patient Account Number: [id number] Date of Birth/Sex: Treating RN: 1946-04-19 (75 y.o. [name] Primary Care Gina Costilla: [name] Other Clinician: [name] Referring Janet Humphreys: Treating Tyrail Grandfield/Extender: Krista Blue in Treatment: 7 HBO Treatment Course Details Treatment Course Number: 1 Ordering Berkley Cronkright: [name] T Treatments Ordered: otal 40 HBO Treatment Start Date: 08/23/2021 HBO Indication: Late Effect of Radiation HBO Treatment Details Treatment Number: 24 Patient Type: Outpatient Chamber Type: Monoplace Chamber Serial #: [id number] Treatment Protocol: 2.0 ATA with 90 minutes oxygen, and no air breaks Treatment Details Compression Rate Down: 2.0 psi / minute De-Compression Rate Up: 2.0 psi / minute Air breaks and breathing Decompress Decompress Compress Tx Pressure Begins Reached periods Begins Ends (leave unused spaces blank) Chamber Pressure (ATA 1 2 ------2 1 ) Clock Time (24 hr) 08:21 08:29 - - - - - - 09:59 10:07 Treatment Length: 106 (minutes) Treatment Segments: 4 Vital Signs Capillary Blood Glucose Reference Range: 80 - 120 mg / dl HBO Diabetic Blood Glucose Intervention Range: <131 mg/dl or >249 mg/dl Time Vitals Blood Respiratory Capillary Blood Glucose Pulse Action Type: Pulse: Temperature: Taken: Pressure: Rate: Glucose (mg/dl): Meter #: Oximetry (%) Taken: Pre 07:46 127/73 66 16 98.4 239 Post 10:12 115/81 68 16 98.6 231 Treatment Response Treatment Toleration: Well Treatment Completion Status: Treatment Completed without Adverse Event Physician HBO Attestation: I certify that I supervised this HBO treatment in accordance with Medicare guidelines. A trained emergency response team is readily available per  Yes hospital policies and procedures. Continue HBOT as ordered. Yes Electronic Signature(s) Signed: 10/01/2021 12:23:09 PM By: Kalman Shan DO Previous Signature: 09/28/2021 11:03:39 AM Version By: [name] CHT EMT BS , , Entered By: Kalman Shan on 10/01/2021 12:20:51 -------------------------------------------------------------------------------- HBO Safety Checklist Details Patient Name: Date of Service: [name] 09/28/2021 7:30 A M Medical Record Number: [id number] Patient Account Number: [id number] Date of Birth/Sex: Treating RN: 1946/05/17 (75 y.o. [name] Primary Care Raymonda Pell: [name] Other Clinician: [name] Referring Derreon Consalvo: Treating Mohammed Mcandrew/Extender: Krista Blue in Treatment: 7 HBO Safety Checklist Items Safety Checklist Consent Form Signed Patient voided / foley secured and emptied When did you last eato 0630 Last dose of injectable or oral agent 0632 Ostomy pouch emptied and vented if applicable NA All implantable devices assessed, documented and approved NA Intravenous access site secured and place NA Valuables secured Linens and cotton and cotton/polyester blend (less than 51% polyester) Personal oil-based products / skin lotions / body lotions removed Wigs or hairpieces removed NA Smoking or tobacco materials removed NA Books / newspapers / magazines / loose paper removed Cologne, aftershave, perfume and deodorant removed Jewelry removed (may wrap wedding band) Make-up removed NA Hair care products removed Battery operated devices (external) removed Heating patches and chemical warmers removed Titanium eyewear removed NA Nail polish cured greater than 10 hours NA Casting material cured greater than 10 hours NA Hearing aids removed NA Loose dentures or partials removed dentures removed Prosthetics have been removed NA Patient demonstrates correct use of  air break device (if applicable) Patient concerns have been addressed Patient grounding bracelet on and cord attached to chamber Specifics for Inpatients (complete in addition to above) Medication sheet sent with patient NA Intravenous medications needed or due during therapy sent with patient NA Drainage tubes (e.g. nasogastric tube or chest  tube secured and vented) NA Endotracheal or Tracheotomy tube secured NA Cuff deflated of air and inflated with saline NA Airway suctioned NA Notes Paper version used prior to treatment. Electronic Signature(s) Signed: 09/28/2021 9:37:02 AM By: [name] CHT EMT BS , , Entered By: [name] on 09/28/2021 09:37:02

## 2021-10-01 ENCOUNTER — Other Ambulatory Visit: Payer: Self-pay

## 2021-10-01 ENCOUNTER — Encounter (HOSPITAL_BASED_OUTPATIENT_CLINIC_OR_DEPARTMENT_OTHER): Payer: Medicare Other | Admitting: Internal Medicine

## 2021-10-01 DIAGNOSIS — Z8546 Personal history of malignant neoplasm of prostate: Secondary | ICD-10-CM

## 2021-10-01 DIAGNOSIS — R350 Frequency of micturition: Secondary | ICD-10-CM | POA: Diagnosis not present

## 2021-10-01 DIAGNOSIS — N3041 Irradiation cystitis with hematuria: Secondary | ICD-10-CM | POA: Diagnosis not present

## 2021-10-01 LAB — GLUCOSE, CAPILLARY
Glucose-Capillary: 242 mg/dL — ABNORMAL HIGH (ref 70–99)
Glucose-Capillary: 257 mg/dL — ABNORMAL HIGH (ref 70–99)

## 2021-10-01 NOTE — Progress Notes (Signed)
AVORY, MIMBS ([id number]) Visit Report for 09/28/2021 SuperBill Details Patient Name: Date of Service: JAQUAVIS, FELMLEE [place] 09/28/2021 Medical Record Number: [id number] Patient Account Number: [id number] Date of Birth/Sex: Treating RN: 1946-05-13 (75 y.o. 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

## 2021-10-01 NOTE — Progress Notes (Signed)
JIONNI, HELMING ([id number]) Visit Report for 10/01/2021 SuperBill Details Patient Name: Date of Service: NISHAWN, ROTAN [place] 10/01/2021 Medical Record Number: [id number] Patient Account Number: [id number] Date of Birth/Sex: Treating RN: June 18, 1946 (75 y.o. 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

## 2021-10-01 NOTE — Progress Notes (Addendum)
TAGE, FEGGINS ([id number]) Visit Report for 10/01/2021 HBO Details Patient Name: Date of Service: THORVALD, ORSINO [name] 10/01/2021 7:30 A M Medical Record Number: [id number] Patient Account Number: [id number] Date of Birth/Sex: Treating RN: 24-Oct-1945 (75 y.o. [name] Primary Care Fawna Cranmer: [name] Other Clinician: [name] Referring Eidan Muellner: Treating Avani Sensabaugh/Extender: Krista Blue in Treatment: 7 HBO Treatment Course Details Treatment Course Number: 1 Ordering Ashely Joshua: Bernerd Pho Treatments Ordered: otal 40 HBO Treatment Start Date: 08/23/2021 HBO Indication: Late Effect of Radiation HBO Treatment Details Treatment Number: 25 Patient Type: Outpatient Chamber Type: Monoplace Chamber Serial #: [id number] Treatment Protocol: 2.0 ATA with 90 minutes oxygen, and no air breaks Treatment Details Compression Rate Down: 2.0 psi / minute De-Compression Rate Up: 2.0 psi / minute Air breaks and breathing Decompress Decompress Compress Tx Pressure Begins Reached periods Begins Ends (leave unused spaces blank) Chamber Pressure (ATA 1 2 ------2 1 ) Clock Time (24 hr) 08:20 08:28 - - - - - - 10:08 10:16 Treatment Length: 116 (minutes) Treatment Segments: 4 Vital Signs Capillary Blood Glucose Reference Range: 80 - 120 mg / dl HBO Diabetic Blood Glucose Intervention Range: <131 mg/dl or >249 mg/dl Time Vitals Blood Respiratory Capillary Blood Glucose Pulse Action Type: Pulse: Temperature: Taken: Pressure: Rate: Glucose (mg/dl): Meter #: Oximetry (%) Taken: Pre 07:54 119/66 64 16 98.4 242 Post 10:16 130/81 67 16 97.9 257 Treatment Response Treatment Toleration: Well Treatment Completion Status: Treatment Completed without Adverse Event Physician HBO Attestation: I certify that I supervised this HBO treatment in accordance with Medicare guidelines. A trained emergency response team is readily available per  Yes hospital policies and procedures. Continue HBOT as ordered. Yes Electronic Signature(s) Signed: 10/01/2021 3:54:51 PM By: Kalman Shan DO Previous Signature: 10/01/2021 11:04:08 AM Version By: [name] CHT EMT BS , , Entered By: Kalman Shan on 10/01/2021 15:47:30 -------------------------------------------------------------------------------- HBO Safety Checklist Details Patient Name: Date of Service: [name] 10/01/2021 7:30 A M Medical Record Number: [id number] Patient Account Number: [id number] Date of Birth/Sex: Treating RN: 1946-06-06 (75 y.o. [name] Primary Care Georjean Toya: [name] Other Clinician: [name] Referring Damiel Barthold: Treating Chanci Ojala/Extender: Krista Blue in Treatment: 7 HBO Safety Checklist Items Safety Checklist Consent Form Signed Patient voided / foley secured and emptied When did you last eato 0700 Last dose of injectable or oral agent 0702 Ostomy pouch emptied and vented if applicable NA All implantable devices assessed, documented and approved NA Intravenous access site secured and place NA Valuables secured Linens and cotton and cotton/polyester blend (less than 51% polyester) Personal oil-based products / skin lotions / body lotions removed Wigs or hairpieces removed NA Smoking or tobacco materials removed NA Books / newspapers / magazines / loose paper removed Cologne, aftershave, perfume and deodorant removed Jewelry removed (may wrap wedding band) Make-up removed NA Hair care products removed Battery operated devices (external) removed Heating patches and chemical warmers removed Titanium eyewear removed NA Nail polish cured greater than 10 hours NA Casting material cured greater than 10 hours NA Hearing aids removed NA Loose dentures or partials removed dentures removed Prosthetics have been removed NA Patient demonstrates correct use of air  break device (if applicable) Patient concerns have been addressed Patient grounding bracelet on and cord attached to chamber Specifics for Inpatients (complete in addition to above) Medication sheet sent with patient NA Intravenous medications needed or due during therapy sent with patient NA Drainage tubes (e.g. nasogastric tube or chest  tube secured and vented) NA Endotracheal or Tracheotomy tube secured NA Cuff deflated of air and inflated with saline NA Airway suctioned NA Notes Paper version used prior to treatment Electronic Signature(s) Signed: 10/01/2021 1:43:14 PM By: [name] CHT EMT BS , , Previous Signature: 10/01/2021 9:30:18 AM Version By: [name] CHT EMT BS , , Entered By: [name] on 10/01/2021 13:43:14

## 2021-10-01 NOTE — Progress Notes (Signed)
MALE, MINISH ([id number]) Visit Report for 10/01/2021 Arrival Information Details Patient Name: Date of Service: MYQUAN, SCHAUMBURG [name] 10/01/2021 7:30 A M Medical Record Number: [id number] Patient Account Number: [id number] Date of Birth/Sex: Treating RN: 08/31/1946 (75 y.o. [name] Primary Care Kniyah Khun: [name] Other Clinician: [name] Referring Shavonna Corella: Treating Braelin Brosch/Extender: Krista Blue in Treatment: 7 Visit Information History Since Last Visit All ordered tests and consults were completed: Yes Patient Arrived: Ambulatory Added or deleted any medications: No Arrival Time: 07:27 Any new allergies or adverse reactions: No Accompanied By: self Had a fall or experienced change in No Transfer Assistance: None activities of daily living that may affect Patient Identification Verified: Yes risk of falls: Secondary Verification Process Completed: Yes Signs or symptoms of abuse/neglect since last visito No Patient Requires Transmission-Based Precautions: No Hospitalized since last visit: No Patient Has Alerts: No Implantable device outside of the clinic excluding No cellular tissue based products placed in the center since last visit: Pain Present Now: No Electronic Signature(s) Signed: 10/01/2021 9:23:52 AM By: [name] CHT EMT BS , , Entered By: [name] on 10/01/2021 09:23:51 -------------------------------------------------------------------------------- Encounter Discharge Information Details Patient Name: Date of Service: [name] 10/01/2021 7:30 A M Medical Record Number: [id number] Patient Account Number: [id number] Date of Birth/Sex: Treating RN: 07/23/1946 (75 y.o. [name] Primary Care Arrick Dutton: [name] Other Clinician: [name] Referring Kagan Hietpas: Treating Lucendia Leard/Extender: Krista Blue in Treatment: 7 Encounter  Discharge Information Items Discharge Condition: Stable Ambulatory Status: Ambulatory Discharge Destination: Home Transportation: Private Auto Accompanied By: self Schedule Follow-up Appointment: No Clinical Summary of Care: Electronic Signature(s) Signed: 10/01/2021 11:05:34 AM By: [name] CHT EMT BS , , Entered By: [name] on 10/01/2021 11:05:33 -------------------------------------------------------------------------------- Vitals Details Patient Name: Date of Service: [name] 10/01/2021 7:30 A M Medical Record Number: [id number] Patient Account Number: [id number] Date of Birth/Sex: Treating RN: 17-Jun-1946 (75 y.o. [name] Primary Care Kito Cuffe: [name] Other Clinician: [name] Referring Gibril Mastro: Treating Stefania Goulart/Extender: Krista Blue in Treatment: 7 Vital Signs Time Taken: 07:54 Temperature (F): 98.4 Height (in): 67 Pulse (bpm): 64 Weight (lbs): 170 Respiratory Rate (breaths/min): 16 Body Mass Index (BMI): 26.6 Blood Pressure (mmHg): 119/66 Capillary Blood Glucose (mg/dl): 242 Reference Range: 80 - 120 mg / dl Electronic Signature(s) Signed: 10/01/2021 9:24:51 AM By: [name] CHT EMT BS , , Entered By: [name] on 10/01/2021 09:24:51

## 2021-10-02 ENCOUNTER — Encounter (HOSPITAL_BASED_OUTPATIENT_CLINIC_OR_DEPARTMENT_OTHER): Payer: Medicare Other | Admitting: Internal Medicine

## 2021-10-02 DIAGNOSIS — N3041 Irradiation cystitis with hematuria: Secondary | ICD-10-CM | POA: Diagnosis not present

## 2021-10-02 LAB — GLUCOSE, CAPILLARY
Glucose-Capillary: 203 mg/dL — ABNORMAL HIGH (ref 70–99)
Glucose-Capillary: 234 mg/dL — ABNORMAL HIGH (ref 70–99)

## 2021-10-02 NOTE — Progress Notes (Addendum)
Antonio [name], Antonio [name] ([id number]) Visit Report for 10/02/2021 Arrival Information Details Patient Name: Date of Service: Antonio [name], Antonio [name] 10/02/2021 7:30 A M Medical Record Number: [id number] Patient Account Number: [id number] Date of Birth/Sex: Treating RN: [birth year] (75 y.o. [name], [name] Primary Care [name]: [name] Other Clinician: [name] Referring [name]: Treating [name]/Extender: [name] in Treatment: 7 Visit Information History Since Last Visit All ordered tests and consults were completed: Yes Patient Arrived: Ambulatory Added or deleted any medications: No Arrival Time: 07:42 Any new allergies or adverse reactions: No Accompanied By: self Had a fall or experienced change in No Transfer Assistance: None activities of daily living that may affect Patient Identification Verified: Yes risk of falls: Secondary Verification Process Completed: Yes Signs or symptoms of abuse/neglect since last visito No Patient Requires Transmission-Based Precautions: No Hospitalized since last visit: No Patient Has Alerts: No Implantable device outside of the clinic excluding No cellular tissue based products placed in the center since last visit: Pain Present Now: No Electronic Signature(s) Signed: 10/02/2021 9:34:10 AM By: [name] CHT EMT BS , , Entered By: [name] on 10/02/2021 09:34:10 -------------------------------------------------------------------------------- Encounter Discharge Information Details Patient Name: Date of Service: Antonio [place] S A. 10/02/2021 7:30 A M Medical Record Number: [id number] Patient Account Number: [id number] Date of Birth/Sex: Treating RN: February 05, 1946 (75 y.o. [name] Primary Care [name]: [name] Other Clinician: [name] Referring [name]: Treating [name]/Extender: [name] in Treatment: 7 Encounter  Discharge Information Items Discharge Condition: Stable Ambulatory Status: Ambulatory Discharge Destination: Home Transportation: Private Auto Accompanied By: self Schedule Follow-up Appointment: No Clinical Summary of Care: Electronic Signature(s) Signed: 10/02/2021 12:28:41 PM By: [name] CHT EMT BS , , Entered By: [name] on 10/02/2021 12:28:40 -------------------------------------------------------------------------------- Vitals Details Patient Name: Date of Service: Antonio [place] MA S A. 10/02/2021 7:30 A M Medical Record Number: [id number] Patient Account Number: [id number] Date of Birth/Sex: Treating RN: 17-Mar-1946 (75 y.o. [name] Primary Care Jaela Yepez: [name] Other Clinician: [name] Referring [name]: Treating [name]: [name] in Treatment: 7 Vital Signs Time Taken: 07:51 Temperature (F): 98.9 Height (in): 67 Pulse (bpm): 63 Weight (lbs): 170 Respiratory Rate (breaths/min): 16 Body Mass Index (BMI): 26.6 Blood Pressure (mmHg): 132/72 Capillary Blood Glucose (mg/dl): 203 Reference Range: 80 - 120 mg / dl Airway Pulse Oximetry (%): 100 Electronic Signature(s) Signed: 10/02/2021 9:35:02 AM By: [name] CHT EMT BS , , Entered By: [name] on 10/02/2021 09:35:02

## 2021-10-02 NOTE — Progress Notes (Addendum)
[name], [name] ([id number]) Visit Report for 10/02/2021 HBO Details Patient Name: Date of Service: Antonio [name], Antonio [name] [name]. 10/02/2021 7:30 [name] Medical Record Number: [id number] Patient Account Number: [id number] Date of Birth/Sex: Treating RN: 09/22/1946 (75 y.o. Antonio [name] Primary Care Anthonia Monger: [name] Other Clinician: [name] Referring Hong Timm: Treating Delina Kruczek/Extender: [name] in Treatment: 7 HBO Treatment Course Details Treatment Course Number: 1 Ordering Jacquette Canales: Bernerd Pho Treatments Ordered: otal 40 HBO Treatment Start Date: 08/23/2021 HBO Indication: Late Effect of Radiation HBO Treatment Details Treatment Number: 26 Patient Type: Outpatient Chamber Type: Monoplace Chamber Serial #: [id number] Treatment Protocol: 2.0 ATA with 90 minutes oxygen, and no air breaks Treatment Details Compression Rate Down: 2.0 psi / minute De-Compression Rate Up: 2.0 psi / minute Air breaks and breathing Decompress Decompress Compress Tx Pressure Begins Reached periods Begins Ends (leave unused spaces blank) Chamber Pressure (ATA 1 2 ------2 1 ) Clock Time (24 hr) 08:07 08:15 - - - - - - 09:45 09:53 Treatment Length: 106 (minutes) Treatment Segments: 4 Vital Signs Capillary Blood Glucose Reference Range: 80 - 120 mg / dl HBO Diabetic Blood Glucose Intervention Range: <131 mg/dl or >249 mg/dl Time Vitals Blood Respiratory Capillary Blood Glucose Pulse Action Type: Pulse: Temperature: Taken: Pressure: Rate: Glucose (mg/dl): Meter #: Oximetry (%) Taken: Pre 07:51 132/72 63 16 98.9 203 100 Post 09:58 150/86 64 16 98.6 234 Treatment Response Treatment Toleration: Well Treatment Completion Status: Treatment Completed without Adverse Event Fredick Schlosser Notes No concerns with treatment given Physician HBO Attestation: I certify that I supervised this HBO treatment in accordance with Medicare guidelines. [name] trained  emergency response team is readily available per Yes hospital policies and procedures. Continue HBOT as ordered. Yes Electronic Signature(s) Signed: 10/02/2021 4:29:35 PM By: [name] MD Previous Signature: 10/02/2021 12:27:34 PM Version By: [name] CHT EMT BS , , Entered By: [name] on 10/02/2021 16:23:36 -------------------------------------------------------------------------------- HBO Safety Checklist Details Patient Name: Date of Service: Antonio [name]. 10/02/2021 7:30 [name] Medical Record Number: [id number] Patient Account Number: [id number] Date of Birth/Sex: Treating RN: 1945/11/18 (75 y.o. Antonio [name] Primary Care Zebulin Siegel: [name] Other Clinician: [name] Referring Antonio [name]: Treating Chantel Teti/Extender: [name] in Treatment: 7 HBO Safety Checklist Items Safety Checklist Consent Form Signed Patient voided / foley secured and emptied When did you last eato 0700 Last dose of injectable or oral agent 0715 Ostomy pouch emptied and vented if applicable NA All implantable devices assessed, documented and approved NA Intravenous access site secured and place NA Valuables secured Linens and cotton and cotton/polyester blend (less than 51% polyester) Personal oil-based products / skin lotions / body lotions removed Wigs or hairpieces removed NA Smoking or tobacco materials removed NA Books / newspapers / magazines / loose paper removed Cologne, aftershave, perfume and deodorant removed Jewelry removed (may wrap wedding band) NA Make-up removed NA Hair care products removed Battery operated devices (external) removed Heating patches and chemical warmers removed Titanium eyewear removed NA Nail polish cured greater than 10 hours NA Casting material cured greater than 10 hours NA Hearing aids removed NA Loose dentures or partials removed dentures removed Prosthetics have been  removed NA Patient demonstrates correct use of air break device (if applicable) Patient concerns have been addressed Patient grounding bracelet on and cord attached to chamber Specifics for Inpatients (complete in addition to above) Medication sheet sent with patient NA Intravenous medications needed or due during therapy sent with  patient NA Drainage tubes (e.g. nasogastric tube or chest tube secured and vented) NA Endotracheal or Tracheotomy tube secured NA Cuff deflated of air and inflated with saline NA Airway suctioned NA Notes Paper version used prior to treatment. Electronic Signature(s) Signed: 10/02/2021 9:33:21 AM By: [name] CHT EMT BS , , Entered By: [name] on 10/02/2021 09:33:21

## 2021-10-02 NOTE — Progress Notes (Signed)
ALONTE, WULFF ([id number]) Visit Report for 10/02/2021 SuperBill Details Patient Name: Date of Service: NANA, [place]SEL[place] 10/02/2021 Medical Record Number: [id number] Patient Account Number: [id number] Date of Birth/Sex: Treating RN: 1946/09/04 (75 y.o. 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

## 2021-10-03 ENCOUNTER — Encounter (HOSPITAL_BASED_OUTPATIENT_CLINIC_OR_DEPARTMENT_OTHER): Payer: Medicare Other | Admitting: Physician Assistant

## 2021-10-03 ENCOUNTER — Other Ambulatory Visit: Payer: Self-pay

## 2021-10-03 DIAGNOSIS — N3041 Irradiation cystitis with hematuria: Secondary | ICD-10-CM | POA: Diagnosis not present

## 2021-10-03 LAB — GLUCOSE, CAPILLARY
Glucose-Capillary: 226 mg/dL — ABNORMAL HIGH (ref 70–99)
Glucose-Capillary: 241 mg/dL — ABNORMAL HIGH (ref 70–99)

## 2021-10-03 NOTE — Progress Notes (Addendum)
GURJOT, BRISCO ([id number]) Visit Report for 10/03/2021 Arrival Information Details Patient Name: Date of Service: VOLNEY, REIERSON [name] 10/03/2021 7:30 A M Medical Record Number: [id number] Patient Account Number: [id number] Date of Birth/Sex: Treating RN: 1945-10-19 (75 y.o. [name] Primary Care Thaila Bottoms: [name] Other Clinician: [name] Referring Maymie Brunke: Treating Nils Thor/Extender: [name] in Treatment: 7 Visit Information History Since Last Visit All ordered tests and consults were completed: Yes Patient Arrived: Ambulatory Added or deleted any medications: No Arrival Time: 07:30 Any new allergies or adverse reactions: No Accompanied By: self Had a fall or experienced change in No Transfer Assistance: None activities of daily living that may affect Patient Identification Verified: Yes risk of falls: Secondary Verification Process Completed: Yes Signs or symptoms of abuse/neglect since last visito No Patient Requires Transmission-Based Precautions: No Hospitalized since last visit: No Patient Has Alerts: No Implantable device outside of the clinic excluding No cellular tissue based products placed in the center since last visit: Pain Present Now: No Electronic Signature(s) Signed: 10/03/2021 11:27:46 AM By: [name] CHT EMT BS , , Entered By: [name] on 10/03/2021 11:27:46 -------------------------------------------------------------------------------- Encounter Discharge Information Details Patient Name: Date of Service: [name] 10/03/2021 7:30 A M Medical Record Number: [id number] Patient Account Number: [id number] Date of Birth/Sex: Treating RN: 03-28-1946 (75 y.o. [name] Primary Care Kayana Thoen: [name] Other Clinician: [name] Referring Mairyn Lenahan: Treating Naly Schwanz/Extender: [name] in Treatment: 7 Encounter  Discharge Information Items Discharge Condition: Stable Ambulatory Status: Ambulatory Discharge Destination: Home Transportation: Private Auto Accompanied By: self Schedule Follow-up Appointment: No Clinical Summary of Care: Electronic Signature(s) Signed: 10/03/2021 11:33:16 AM By: [name] CHT EMT BS , , Entered By: [name] on 10/03/2021 11:33:16 -------------------------------------------------------------------------------- Vitals Details Patient Name: Date of Service: [name] 10/03/2021 7:30 A M Medical Record Number: [id number] Patient Account Number: [id number] Date of Birth/Sex: Treating RN: 1945-12-20 (75 y.o. [name] Primary Care Garin Mata: [name] Other Clinician: [name] Referring Rossanna Spitzley: Treating Wonda Goodgame/Extender: [name] in Treatment: 7 Vital Signs Time Taken: 07:39 Temperature (F): 132/74 Height (in): 67 Pulse (bpm): 66 Weight (lbs): 170 Respiratory Rate (breaths/min): 16 Body Mass Index (BMI): 26.6 Blood Pressure (mmHg): 132/74 Capillary Blood Glucose (mg/dl): 226 Reference Range: 80 - 120 mg / dl Electronic Signature(s) Signed: 10/03/2021 11:30:10 AM By: [name] CHT EMT BS , , Previous Signature: 10/03/2021 11:28:28 AM Version By: [name] CHT EMT BS , , Entered By: [name] on 10/03/2021 11:30:10

## 2021-10-03 NOTE — Progress Notes (Addendum)
BLESSING, OZGA ([id number]) Visit Report for 10/03/2021 HBO Details Patient Name: Date of Service: Antonio [name], Antonio [name] [name] 10/03/2021 7:30 A M Medical Record Number: [id number] Patient Account Number: [id number] Date of Birth/Sex: Treating RN: 10-02-1946 (75 y.o. [name] Primary Care Curley Fayette: [name] Other Clinician: [name] Referring Andrewjames Weirauch: Treating Dearis Danis/Extender: [name] in Treatment: 7 HBO Treatment Course Details Treatment Course Number: 1 Ordering Mattias Walmsley: [name] T Treatments Ordered: otal 40 HBO Treatment Start Date: 08/23/2021 HBO Indication: Late Effect of Radiation HBO Treatment Details Treatment Number: 27 Patient Type: Outpatient Chamber Type: Monoplace Chamber Serial #: [id number] Treatment Protocol: 2.0 ATA with 90 minutes oxygen, and no air breaks Treatment Details Compression Rate Down: 2.0 psi / minute De-Compression Rate Up: 2.0 psi / minute Air breaks and breathing Decompress Decompress Compress Tx Pressure Begins Reached periods Begins Ends (leave unused spaces blank) Chamber Pressure (ATA 1 2 ------2 1 ) Clock Time (24 hr) 07:59 08:07 - - - - - - 09:37 09:45 Treatment Length: 106 (minutes) Treatment Segments: 4 Vital Signs Capillary Blood Glucose Reference Range: 80 - 120 mg / dl HBO Diabetic Blood Glucose Intervention Range: <131 mg/dl or >249 mg/dl Time Vitals Blood Respiratory Capillary Blood Glucose Pulse Action Type: Pulse: Temperature: Taken: Pressure: Rate: Glucose (mg/dl): Meter #: Oximetry (%) Taken: Pre 07:39 132/74 66 16 98.4 226 Post 09:47 127/76 64 16 98.7 241 Treatment Response Treatment Toleration: Well Treatment Completion Status: Treatment Completed without Adverse Event Electronic Signature(s) Signed: 10/03/2021 11:32:06 AM By: [name] CHT EMT BS , , Signed: 10/03/2021 5:18:31 PM By: [name] Entered By: [name]  on 10/03/2021 11:32:05 -------------------------------------------------------------------------------- HBO Safety Checklist Details Patient Name: Date of Service: Antonio Baltimore MA S A. 10/03/2021 7:30 A M Medical Record Number: [id number] Patient Account Number: [id number] Date of Birth/Sex: Treating RN: 05-24-1946 (75 y.o. [name] Primary Care Georgianne Gritz: [name] Other Clinician: [name] Referring Rosmery Duggin: Treating Pama Roskos/Extender: [name] in Treatment: 7 HBO Safety Checklist Items Safety Checklist Consent Form Signed Patient voided / foley secured and emptied When did you last eato 0700 Last dose of injectable or oral agent 0702 Ostomy pouch emptied and vented if applicable NA All implantable devices assessed, documented and approved NA Intravenous access site secured and place NA Valuables secured Linens and cotton and cotton/polyester blend (less than 51% polyester) Personal oil-based products / skin lotions / body lotions removed Wigs or hairpieces removed NA Smoking or tobacco materials removed NA Books / newspapers / magazines / loose paper removed Cologne, aftershave, perfume and deodorant removed Jewelry removed (may wrap wedding band) Make-up removed NA Hair care products removed Battery operated devices (external) removed Heating patches and chemical warmers removed Titanium eyewear removed NA Nail polish cured greater than 10 hours NA Casting material cured greater than 10 hours NA Hearing aids removed NA Loose dentures or partials removed Dentures removed Prosthetics have been removed NA Patient demonstrates correct use of air break device (if applicable) Patient concerns have been addressed Patient grounding bracelet on and cord attached to chamber Specifics for Inpatients (complete in addition to above) Medication sheet sent with patient NA Intravenous medications needed or due during  therapy sent with patient NA Drainage tubes (e.g. nasogastric tube or chest tube secured and vented) NA Endotracheal or Tracheotomy tube secured NA Cuff deflated of air and inflated with saline NA Airway suctioned NA Notes Paper version used prior to treatment. Electronic Signature(s) Signed: 10/03/2021  1:37:25 PM By: [name] CHT EMT BS , , Previous Signature: 10/03/2021 11:29:40 AM Version By: [name] CHT EMT BS , , Entered By: [name] on 10/03/2021 13:37:25

## 2021-10-03 NOTE — Progress Notes (Signed)
MORSE, BRUEGGEMANN ([id number]) Visit Report for 10/03/2021 SuperBill Details Patient Name: Date of Service: OLUWASEUN, CREMER [place] 10/03/2021 Medical Record Number: [id number] Patient Account Number: [id number] Date of Birth/Sex: Treating RN: 1946-06-13 (75 y.o. 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

## 2021-10-04 ENCOUNTER — Encounter (HOSPITAL_BASED_OUTPATIENT_CLINIC_OR_DEPARTMENT_OTHER): Payer: Medicare Other | Admitting: Internal Medicine

## 2021-10-04 DIAGNOSIS — N3041 Irradiation cystitis with hematuria: Secondary | ICD-10-CM | POA: Diagnosis not present

## 2021-10-04 LAB — GLUCOSE, CAPILLARY
Glucose-Capillary: 154 mg/dL — ABNORMAL HIGH (ref 70–99)
Glucose-Capillary: 146 mg/dL — ABNORMAL HIGH (ref 70–99)

## 2021-10-04 NOTE — Progress Notes (Addendum)
HAMZEH, TALL ([id number]) Visit Report for 10/04/2021 HBO Details Patient Name: Date of Service: Antonio [name], Antonio [name] [name] 10/04/2021 7:30 A M Medical Record Number: [id number] Patient Account Number: [id number] Date of Birth/Sex: Treating RN: November 13, 1945 (75 y.o. Antonio [name], Antonio [name] Primary Care Tylee Newby: [name] Other Clinician: [name] Referring Juhi Lagrange: Treating Braelin Costlow/Extender: [name] in Treatment: 8 HBO Treatment Course Details Treatment Course Number: 1 Ordering Langdon Crosson: Bernerd Pho Treatments Ordered: otal 40 HBO Treatment Start Date: 08/23/2021 HBO Indication: Late Effect of Radiation HBO Treatment Details Treatment Number: 28 Patient Type: Outpatient Chamber Type: Monoplace Chamber Serial #: [id number] Treatment Protocol: 2.0 ATA with 90 minutes oxygen, and no air breaks Treatment Details Compression Rate Down: 2.0 psi / minute De-Compression Rate Up: 2.0 psi / minute Air breaks and breathing Decompress Decompress Compress Tx Pressure Begins Reached periods Begins Ends (leave unused spaces blank) Chamber Pressure (ATA 1 2 ------2 1 ) Clock Time (24 hr) 08:18 08:27 - - - - - - 09:57 10:12 Treatment Length: 114 (minutes) Treatment Segments: 4 Vital Signs Capillary Blood Glucose Reference Range: 80 - 120 mg / dl HBO Diabetic Blood Glucose Intervention Range: <131 mg/dl or >249 mg/dl Time Vitals Blood Respiratory Capillary Blood Glucose Pulse Action Type: Pulse: Temperature: Taken: Pressure: Rate: Glucose (mg/dl): Meter #: Oximetry (%) Taken: Pre 07:36 126/75 64 16 99.1 154 Post 10:16 145/78 65 16 98.6 146 Treatment Response Treatment Toleration: Well Treatment Completion Status: Treatment Completed without Adverse Event Christle Nolting Notes No concerns with treatment given Physician HBO Attestation: I certify that I supervised this HBO treatment in accordance with Medicare guidelines. A trained  emergency response team is readily available per Yes hospital policies and procedures. Continue HBOT as ordered. Yes Electronic Signature([name]) Signed: 10/04/2021 3:43:54 PM By: [name] MD Previous Signature: 10/04/2021 11:44:14 AM Version By: [name] CHT EMT BS , , Entered By: [name] on 10/04/2021 15:38:47 -------------------------------------------------------------------------------- HBO Safety Checklist Details Patient Name: Date of Service: Antonio [name] A. 10/04/2021 7:30 A M Medical Record Number: [id number] Patient Account Number: [id number] Date of Birth/Sex: Treating RN: [birth year] (75 y.o. Antonio [name], Antonio [name] Primary Care Suheyb Raucci: [name] Other Clinician: [name] Referring Alesandro Stueve: Treating Calistro Rauf/Extender: [name] in Treatment: 8 HBO Safety Checklist Items Safety Checklist Consent Form Signed Patient voided / foley secured and emptied When did you last eato 0700 Last dose of injectable or oral agent 0702 Ostomy pouch emptied and vented if applicable NA All implantable devices assessed, documented and approved NA Intravenous access site secured and place NA Valuables secured Linens and cotton and cotton/polyester blend (less than 51% polyester) Personal oil-based products / skin lotions / body lotions removed Wigs or hairpieces removed NA Smoking or tobacco materials removed NA Books / newspapers / magazines / loose paper removed Cologne, aftershave, perfume and deodorant removed Jewelry removed (may wrap wedding band) Make-up removed NA Hair care products removed Battery operated devices (external) removed Heating patches and chemical warmers removed Titanium eyewear removed NA Nail polish cured greater than 10 hours NA Casting material cured greater than 10 hours NA Hearing aids removed NA Loose dentures or partials removed dentures removed Prosthetics have been  removed NA Patient demonstrates correct use of air break device (if applicable) Patient concerns have been addressed Patient grounding bracelet on and cord attached to chamber Specifics for Inpatients (complete in addition to above) Medication sheet sent with patient NA Intravenous medications needed or due during therapy sent with patient NA  Drainage tubes (e.g. nasogastric tube or chest tube secured and vented) NA Endotracheal or Tracheotomy tube secured NA Cuff deflated of air and inflated with saline NA Airway suctioned NA Notes Paper version used prior to treatment. Electronic Signature([name]) Signed: 10/04/2021 5:32:48 PM By: [name] CHT EMT BS , , Previous Signature: 10/04/2021 9:47:37 AM Version By: [name] CHT EMT BS , , Entered By: [name] on 10/04/2021 17:32:48

## 2021-10-04 NOTE — Progress Notes (Signed)
Antonio [name], Antonio [name] ([id number]) Visit Report for 10/04/2021 SuperBill Details Patient Name: Date of Service: Antonio [name], Antonio [name] 10/04/2021 Medical Record Number: [id number] Patient Account Number: [id number] Date of Birth/Sex: Treating RN: [birth year] (75 y.o. 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

## 2021-10-04 NOTE — Progress Notes (Addendum)
Antonio [name], Antonio [name] ([id number]) Visit Report for 10/04/2021 Arrival Information Details Patient Name: Date of Service: Antonio [name], Antonio [name] [name] 10/04/2021 7:30 A M Medical Record Number: [id number] Patient Account Number: [id number] Date of Birth/Sex: Treating RN: [birth year] (75 y.o. [name], [name] Primary Care Forever Arechiga: [name] Other Clinician: [name] Referring Verniece Encarnacion: Treating Lamaria Hildebrandt/Extender: [name] in Treatment: 8 Visit Information History Since Last Visit All ordered tests and consults were completed: Yes Patient Arrived: Ambulatory Added or deleted any medications: No Arrival Time: 09:39 Any new allergies or adverse reactions: No Accompanied By: self Had a fall or experienced change in No Transfer Assistance: None activities of daily living that may affect Patient Identification Verified: Yes risk of falls: Secondary Verification Process Completed: Yes Signs or symptoms of abuse/neglect since last visito No Patient Requires Transmission-Based Precautions: No Hospitalized since last visit: No Patient Has Alerts: No Implantable device outside of the clinic excluding No cellular tissue based products placed in the center since last visit: Pain Present Now: No Electronic Signature(s) Signed: 10/04/2021 9:42:58 AM By: [name] CHT EMT BS , , Entered By: [name] on 10/04/2021 09:42:58 -------------------------------------------------------------------------------- Encounter Discharge Information Details Patient Name: Date of Service: Antonio Baltimore MA S A. 10/04/2021 7:30 A M Medical Record Number: [id number] Patient Account Number: [id number] Date of Birth/Sex: Treating RN: 08-13-1946 (75 y.o. [name] Primary Care [place] Cookson: [name] Other Clinician: [name] Referring Noya Santarelli: Treating Perrin Gens/Extender: [name] in Treatment: 8 Encounter  Discharge Information Items Discharge Condition: Stable Ambulatory Status: Ambulatory Discharge Destination: Home Transportation: Private Auto Accompanied By: self Schedule Follow-up Appointment: No Clinical Summary of Care: Electronic Signature(s) Signed: 10/17/2021 9:17:17 AM By: [name] CHT EMT BS , , Entered By: [name] on 10/04/2021 12:31:31 -------------------------------------------------------------------------------- Vitals Details Patient Name: Date of Service: Antonio Baltimore MA S A. 10/04/2021 7:30 A M Medical Record Number: [id number] Patient Account Number: [id number] Date of Birth/Sex: Treating RN: [birth year] (75 y.o. [name], [name] Primary Care Jamaria Amborn: [name] Other Clinician: [name] Referring Trudi Morgenthaler: Treating Bradd Merlos/Extender: [name] in Treatment: 8 Vital Signs Time Taken: 07:36 Temperature (F): 99.1 Height (in): 67 Pulse (bpm): 64 Weight (lbs): 170 Respiratory Rate (breaths/min): 16 Body Mass Index (BMI): 26.6 Blood Pressure (mmHg): 126/75 Capillary Blood Glucose (mg/dl): 154 Reference Range: 80 - 120 mg / dl Electronic Signature(s) Signed: 10/04/2021 9:45:15 AM By: [name] CHT EMT BS , , Entered By: [name] on 10/04/2021 09:45:14
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
## 2021-10-05 ENCOUNTER — Encounter (HOSPITAL_BASED_OUTPATIENT_CLINIC_OR_DEPARTMENT_OTHER): Payer: Medicare Other | Admitting: Internal Medicine

## 2021-10-05 ENCOUNTER — Ambulatory Visit (HOSPITAL_BASED_OUTPATIENT_CLINIC_OR_DEPARTMENT_OTHER): Payer: Medicare Other | Admitting: Physician Assistant

## 2021-10-09 ENCOUNTER — Encounter (HOSPITAL_BASED_OUTPATIENT_CLINIC_OR_DEPARTMENT_OTHER): Payer: Medicare Other | Admitting: Internal Medicine

## 2021-10-10 ENCOUNTER — Other Ambulatory Visit: Payer: Self-pay

## 2021-10-10 ENCOUNTER — Encounter (HOSPITAL_BASED_OUTPATIENT_CLINIC_OR_DEPARTMENT_OTHER): Payer: Medicare Other | Admitting: Internal Medicine

## 2021-10-10 DIAGNOSIS — N3041 Irradiation cystitis with hematuria: Secondary | ICD-10-CM | POA: Diagnosis not present

## 2021-10-10 LAB — GLUCOSE, CAPILLARY
Glucose-Capillary: 141 mg/dL — ABNORMAL HIGH (ref 70–99)
Glucose-Capillary: 134 mg/dL — ABNORMAL HIGH (ref 70–99)

## 2021-10-10 NOTE — Progress Notes (Signed)
Antonio [name], Antonio [name] ([id number]) Visit Report for 10/10/2021 HBO Details Patient Name: Date of Service: Antonio [name], Antonio [name] [name] 10/10/2021 7:30 A M Medical Record Number: [id number] Patient Account Number: [id number] Date of Birth/Sex: Treating RN: 1946/09/27 (75 y.o. [name], [name] Primary Care Anadelia Kintz: [name] Other Clinician: Referring Elber Galyean: Treating Anett Ranker/Extender: [name] in Treatment: 8 HBO Treatment Course Details Treatment Course Number: 1 Ordering Lyndon Chenoweth: Bernerd Pho Treatments Ordered: otal 40 HBO Treatment Start Date: 08/23/2021 HBO Indication: Late Effect of Radiation HBO Treatment Details Treatment Number: 29 Patient Type: Outpatient Chamber Type: Monoplace Chamber Serial #: [id number] Treatment Protocol: 2.0 ATA with 90 minutes oxygen, and no air breaks Treatment Details Compression Rate Down: 2.0 psi / minute De-Compression Rate Up: 2.0 psi / minute Air breaks and breathing Decompress Decompress Compress Tx Pressure Begins Reached periods Begins Ends (leave unused spaces blank) Chamber Pressure (ATA 1 2 ------2 1 ) Clock Time (24 hr) 08:02 08:11 - - - - - - 09:41 09:49 Treatment Length: 107 (minutes) Treatment Segments: 4 Vital Signs Capillary Blood Glucose Reference Range: 80 - 120 mg / dl HBO Diabetic Blood Glucose Intervention Range: <131 mg/dl or >249 mg/dl Time Vitals Blood Respiratory Capillary Blood Glucose Pulse Action Type: Pulse: Temperature: Taken: Pressure: Rate: Glucose (mg/dl): Meter #: Oximetry (%) Taken: Pre 07:47 118/71 63 16 98.2 141 Post 09:50 138/81 62 18 98.4 134 Treatment Response Treatment Toleration: Well Treatment Completion Status: Treatment Completed without Adverse Event Caiya Bettes Notes No concerns with treatment given Physician HBO Attestation: I certify that I supervised this HBO treatment in accordance with Medicare guidelines. A trained emergency response team is  readily available per Yes hospital policies and procedures. Continue HBOT as ordered. Yes Electronic Signature(s) Signed: 10/10/2021 3:46:55 PM By: [name] MD Previous Signature: 10/10/2021 3:01:11 PM Version By: Deon Pilling RN, BSN Entered By: [name] on 10/10/2021 15:24:47 -------------------------------------------------------------------------------- HBO Safety Checklist Details Patient Name: Date of Service: Antonio [place] MA S A. 10/10/2021 7:30 A M Medical Record Number: [id number] Patient Account Number: [id number] Date of Birth/Sex: Treating RN: 10/18/1945 (75 y.o. [name], [name] Primary Care Romayne Ticas: [name] Other Clinician: Referring Nakyah Erdmann: Treating Willena Jeancharles/Extender: [name] in Treatment: 8 HBO Safety Checklist Items Safety Checklist Consent Form Signed Patient voided / foley secured and emptied When did you last eato 0700; cheese potatoes Last dose of injectable or oral agent 0700 Ostomy pouch emptied and vented if applicable NA All implantable devices assessed, documented and approved NA Intravenous access site secured and place NA Valuables secured Linens and cotton and cotton/polyester blend (less than 51% polyester) Personal oil-based products / skin lotions / body lotions removed Wigs or hairpieces removed NA Smoking or tobacco materials removed NA Books / newspapers / magazines / loose paper removed Cologne, aftershave, perfume and deodorant removed Jewelry removed (may wrap wedding band) NA Make-up removed NA Hair care products removed NA Battery operated devices (external) removed NA Heating patches and chemical warmers removed NA Titanium eyewear removed NA Nail polish cured greater than 10 hours NA Casting material cured greater than 10 hours NA Hearing aids removed NA Loose dentures or partials removed Prosthetics have been removed NA Patient demonstrates correct use of  air break device (if applicable) Patient concerns have been addressed Patient grounding bracelet on and cord attached to chamber Specifics for Inpatients (complete in addition to above) Medication sheet sent with patient Intravenous medications needed or due during therapy sent with patient Drainage tubes (e.g. nasogastric tube  or chest tube secured and vented) Endotracheal or Tracheotomy tube secured Cuff deflated of air and inflated with saline Airway suctioned Electronic Signature(s) Signed: 10/10/2021 3:01:11 PM By: Deon Pilling RN, BSN Entered By: Deon Pilling on 10/10/2021 08:33:26

## 2021-10-10 NOTE — Progress Notes (Signed)
Antonio, [name] ([id number]) Visit Report for 10/10/2021 Arrival Information Details Patient Name: Date of Service: Antonio [name], Antonio [name] [name] 10/10/2021 7:30 A M Medical Record Number: [id number] Patient Account Number: [id number] Date of Birth/Sex: Treating RN: 1946-03-08 (75 y.o. [name], [name] Primary Care Brandey Vandalen: [name] Other Clinician: Referring Paige Vanderwoude: Treating Marie Borowski/Extender: [name] in Treatment: 8 Visit Information History Since Last Visit Added or deleted any medications: No Patient Arrived: Ambulatory Any new allergies or adverse reactions: No Arrival Time: 07:47 Had a fall or experienced change in No Accompanied By: self activities of daily living that may affect Transfer Assistance: None risk of falls: Patient Identification Verified: Yes Signs or symptoms of abuse/neglect since last visito No Secondary Verification Process Completed: Yes Hospitalized since last visit: No Patient Requires Transmission-Based Precautions: No Implantable device outside of the clinic excluding No Patient Has Alerts: No cellular tissue based products placed in the center since last visit: Pain Present Now: No Electronic Signature(s) Signed: 10/10/2021 3:01:11 PM By: Deon Pilling RN, BSN Entered By: Deon Pilling on 10/10/2021 08:29:55 -------------------------------------------------------------------------------- Encounter Discharge Information Details Patient Name: Date of Service: Antonio Baltimore MA S A. 10/10/2021 7:30 A M Medical Record Number: [id number] Patient Account Number: [id number] Date of Birth/Sex: Treating RN: 03-10-1946 (75 y.o. [name] Primary Care Decarlos Empey: [name] Other Clinician: Referring Omario Ander: Treating Sari Cogan/Extender: [name] in Treatment: 8 Encounter Discharge Information Items Discharge Condition: Stable Ambulatory Status: Ambulatory Discharge Destination:  Home Transportation: Other Accompanied By: self Schedule Follow-up Appointment: Yes Clinical Summary of Care: Electronic Signature(s) Signed: 10/10/2021 3:01:11 PM By: Deon Pilling RN, BSN Entered By: Deon Pilling on 10/10/2021 10:38:40 -------------------------------------------------------------------------------- Vitals Details Patient Name: Date of Service: Antonio Baltimore MA S A. 10/10/2021 7:30 A M Medical Record Number: [id number] Patient Account Number: [id number] Date of Birth/Sex: Treating RN: September 09, 1946 (75 y.o. [name], [name] Primary Care Othello Dickenson: [name] Other Clinician: Referring Sofiya Ezelle: Treating Mallarie Voorhies/Extender: [name] in Treatment: 8 Vital Signs Time Taken: 07:47 Temperature (F): 98.2 Height (in): 67 Pulse (bpm): 63 Weight (lbs): 170 Respiratory Rate (breaths/min): 16 Body Mass Index (BMI): 26.6 Blood Pressure (mmHg): 118/71 Capillary Blood Glucose (mg/dl): 141 Reference Range: 80 - 120 mg / dl Electronic Signature(s) Signed: 10/10/2021 3:01:11 PM By: Deon Pilling RN, BSN Entered By: Deon Pilling on 10/10/2021 08:32:03

## 2021-10-10 NOTE — Progress Notes (Signed)
Antonio [name], Antonio [name] ([id number]) Visit Report for 10/10/2021 Problem List Details Patient Name: Date of Service: Antonio [name], Antonio [name] [name] 10/10/2021 7:30 A M Medical Record Number: [id number] Patient Account Number: [id number] Date of Birth/Sex: Treating RN: [birth year] (75 y.o. [name] Primary Care Provider: [name] Other Clinician: Referring Provider: Treating Provider/Extender: [name] in Treatment: 8 Active Problems ICD-10 Encounter Code Description Active Date MDM Diagnosis N30.41 Irradiation cystitis with hematuria 08/09/2021 No Yes Z85.46 Personal history of malignant neoplasm of prostate 08/09/2021 No Yes R35.0 Frequency of micturition 08/09/2021 No Yes Inactive Problems Resolved Problems Electronic Signature(s) Signed: 10/10/2021 3:01:11 PM By: Deon Pilling RN, BSN Signed: 10/10/2021 3:46:55 PM By: [name] MD Entered By: Deon Pilling on 10/10/2021 10:38:18 -------------------------------------------------------------------------------- SuperBill Details Patient Name: Date of Service: [name] 10/10/2021 Medical Record Number: [id number] Patient Account Number: [id number] Date of Birth/Sex: Treating RN: 06/29/1946 (75 y.o. [name] Primary Care Provider: [name] Other Clinician: Referring Provider: Treating Provider/Extender: [name] in Treatment: 8 Diagnosis Coding ICD-10 Codes Code Description N30.41 Irradiation cystitis with hematuria Z85.46 Personal history of malignant neoplasm of prostate R35.0 Frequency of micturition Facility Procedures CPT4 Code: 09311216 Description: G0277-(Facility Use Only) HBOT full body chamber, 29min , Modifier: Quantity: 4 Physician Procedures : CPT4 Code Description Modifier [id number] - WC PHYS HYPERBARIC OXYGEN THERAPY ICD-10 Diagnosis Description N30.41 Irradiation cystitis with hematuria Z85.46 Personal history of  malignant neoplasm of prostate R35.0 Frequency of micturition Quantity: 1 Electronic Signature(s) Signed: 10/10/2021 3:01:11 PM By: Deon Pilling RN, BSN Signed: 10/10/2021 3:46:55 PM By: [name] MD Entered By: Deon Pilling on 10/10/2021 10:37:41

## 2021-10-11 ENCOUNTER — Encounter (HOSPITAL_BASED_OUTPATIENT_CLINIC_OR_DEPARTMENT_OTHER): Payer: Medicare Other | Admitting: Internal Medicine

## 2021-10-11 DIAGNOSIS — N3041 Irradiation cystitis with hematuria: Secondary | ICD-10-CM | POA: Diagnosis not present

## 2021-10-11 LAB — GLUCOSE, CAPILLARY
Glucose-Capillary: 205 mg/dL — ABNORMAL HIGH (ref 70–99)
Glucose-Capillary: 171 mg/dL — ABNORMAL HIGH (ref 70–99)

## 2021-10-11 NOTE — Progress Notes (Addendum)
JAYDIEN, PANEPINTO ([id number]) Visit Report for 10/11/2021 HBO Details Patient Name: Date of Service: JAROME, TRULL [name] 10/11/2021 7:30 A M Medical Record Number: [id number] Patient Account Number: [id number] Date of Birth/Sex: Treating RN: [birth year] (75 y.o. M) Primary Care Cayce Quezada: [name] Other Clinician: Maye Hides Referring Laelani Vasko: Treating Collyns Mcquigg/Extender: [name] in Treatment: 9 HBO Treatment Course Details Treatment Course Number: 1 Ordering Haru Shaff: Bernerd Pho Treatments Ordered: otal 40 HBO Treatment Start Date: 08/23/2021 HBO Indication: Late Effect of Radiation HBO Treatment Details Treatment Number: 30 Patient Type: Outpatient Chamber Type: Monoplace Chamber Serial #: [id number] Treatment Protocol: 2.0 ATA with 90 minutes oxygen, and no air breaks Treatment Details Compression Rate Down: 2.0 psi / minute De-Compression Rate Up: 2.0 psi / minute Air breaks and breathing Decompress Decompress Compress Tx Pressure Begins Reached periods Begins Ends (leave unused spaces blank) Chamber Pressure (ATA 1 2 ------2 1 ) Clock Time (24 hr) 08:03 08:11 - - - - - - 09:41 09:49 Treatment Length: 106 (minutes) Treatment Segments: 4 Vital Signs Capillary Blood Glucose Reference Range: 80 - 120 mg / dl HBO Diabetic Blood Glucose Intervention Range: <131 mg/dl or >249 mg/dl Time Vitals Blood Respiratory Capillary Blood Glucose Pulse Action Type: Pulse: Temperature: Taken: Pressure: Rate: Glucose (mg/dl): Meter #: Oximetry (%) Taken: Pre 07:50 117/67 68 16 97.7 205 Post 10:00 127/72 65 16 98.4 171 Treatment Response Treatment Completion Status: Treatment Completed without Adverse Event Treatment Notes Patient treatment was uneventful Didier Brandenburg Notes No concerns with treatment given Physician HBO Attestation: I certify that I supervised this HBO treatment in accordance with Medicare guidelines. A trained  emergency response team is readily available per Yes hospital policies and procedures. Continue HBOT as ordered. Yes Electronic Signature(s) Signed: 10/11/2021 5:16:25 PM By: [name] MD Previous Signature: 10/11/2021 12:14:17 PM Version By: Maye Hides Entered By: [name] on 10/11/2021 17:11:17 -------------------------------------------------------------------------------- HBO Safety Checklist Details Patient Name: Date of Service: [name] 10/11/2021 7:30 A M Medical Record Number: [id number] Patient Account Number: [id number] Date of Birth/Sex: Treating RN: 25-Feb-1946 (75 y.o. M) Primary Care Findley Vi: [name] Other Clinician: Maye Hides Referring Siyana Erney: Treating Rock Sobol/Extender: [name] in Treatment: 9 HBO Safety Checklist Items Safety Checklist Consent Form Signed Patient voided / foley secured and emptied When did you last eato 0720 Last dose of injectable or oral agent Ostomy pouch emptied and vented if applicable NA All implantable devices assessed, documented and approved NA Intravenous access site secured and place NA Valuables secured Linens and cotton and cotton/polyester blend (less than 51% polyester) Personal oil-based products / skin lotions / body lotions removed Wigs or hairpieces removed NA Smoking or tobacco materials removed NA Books / newspapers / magazines / loose paper removed Cologne, aftershave, perfume and deodorant removed Jewelry removed (may wrap wedding band) Make-up removed NA Hair care products removed Battery operated devices (external) removed NA Heating patches and chemical warmers removed Titanium eyewear removed Nail polish cured greater than 10 hours NA Casting material cured greater than 10 hours NA Hearing aids removed NA Loose dentures or partials removed NA Prosthetics have been removed NA Patient demonstrates correct use of air break  device (if applicable) Patient concerns have been addressed NA Patient grounding bracelet on and cord attached to chamber NA Specifics for Inpatients (complete in addition to above) Medication sheet sent with patient NA Intravenous medications needed or due during therapy sent with patient NA Drainage tubes (e.g. nasogastric tube or  chest tube secured and vented) NA Endotracheal or Tracheotomy tube secured NA Cuff deflated of air and inflated with saline NA Airway suctioned NA Electronic Signature(s) Signed: 10/11/2021 12:09:43 PM By: Maye Hides Entered By: Maye Hides on 10/11/2021 12:09:43

## 2021-10-11 NOTE — Progress Notes (Signed)
JOBANI, SABADO ([id number]) Visit Report for 10/11/2021 SuperBill Details Patient Name: Date of Service: CHOU, BUSLER [place] 10/11/2021 Medical Record Number: [id number] Patient Account Number: [id number] Date of Birth/Sex: Treating RN: September 16, 1946 (75 y.o. 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

## 2021-10-11 NOTE — Progress Notes (Signed)
Antonio [name], Antonio [name] ([id number]) Visit Report for 10/11/2021 Arrival Information Details Patient Name: Date of Service: Antonio [name], Antonio [name] [name]. 10/11/2021 7:30 [name] Medical Record Number: [id number] Patient Account Number: [id number] Date of Birth/Sex: Treating RN: 26-Jul-1946 (75 y.o. [name], [name] Primary Care Haili Donofrio: [name] Other Clinician: Maye Hides Referring Joella Saefong: Treating Markisha Meding/Extender: [name] in Treatment: 9 Visit Information History Since Last Visit Added or deleted any medications: No Patient Arrived: Ambulatory Any new allergies or adverse reactions: No Arrival Time: 07:50 Had [name] fall or experienced change in No Accompanied By: alone activities of daily living that may affect Transfer Assistance: None risk of falls: Patient Identification Verified: Yes Signs or symptoms of abuse/neglect since last visito No Secondary Verification Process Completed: Yes Hospitalized since last visit: No Patient Requires Transmission-Based Precautions: No Implantable device outside of the clinic excluding No Patient Has Alerts: No cellular tissue based products placed in the center since last visit: Pain Present Now: No Electronic Signature(s) Signed: 10/11/2021 11:59:56 AM By: Maye Hides Entered By: Maye Hides on 10/11/2021 11:59:56 -------------------------------------------------------------------------------- Vitals Details Patient Name: Date of Service: Antonio [name]. 10/11/2021 7:30 [name] Medical Record Number: [id number] Patient Account Number: [id number] Date of Birth/Sex: Treating RN: 1946/06/06 (75 y.o. [name] Primary Care Mashonda Broski: [name] Other Clinician: Maye Hides Referring Dontrail Blackwell: Treating Tyjai Matuszak/Extender: [name] in Treatment: 9 Vital Signs Time Taken: 07:50 Temperature (F): 97.7 Height (in): 67 Pulse (bpm): 68 Weight (lbs):  170 Respiratory Rate (breaths/min): 16 Body Mass Index (BMI): 26.6 Blood Pressure (mmHg): 117/67 Capillary Blood Glucose (mg/dl): 205 Reference Range: 80 - 120 mg / dl Electronic Signature(s) Signed: 10/11/2021 12:00:12 PM By: Maye Hides Entered By: Maye Hides on 10/11/2021 12:00:12

## 2021-10-12 ENCOUNTER — Other Ambulatory Visit: Payer: Self-pay

## 2021-10-12 ENCOUNTER — Encounter (HOSPITAL_BASED_OUTPATIENT_CLINIC_OR_DEPARTMENT_OTHER): Payer: Medicare Other | Admitting: Internal Medicine

## 2021-10-12 ENCOUNTER — Encounter: Payer: Self-pay | Admitting: Cardiology

## 2021-10-12 DIAGNOSIS — N3041 Irradiation cystitis with hematuria: Secondary | ICD-10-CM

## 2021-10-12 DIAGNOSIS — Z8546 Personal history of malignant neoplasm of prostate: Secondary | ICD-10-CM | POA: Diagnosis not present

## 2021-10-12 LAB — GLUCOSE, CAPILLARY
Glucose-Capillary: 205 mg/dL — ABNORMAL HIGH (ref 70–99)
Glucose-Capillary: 252 mg/dL — ABNORMAL HIGH (ref 70–99)

## 2021-10-12 NOTE — Progress Notes (Signed)
EMRIC, KOWALEWSKI ([id number]) Visit Report for 10/12/2021 SuperBill Details Patient Name: Date of Service: Antonio [name], Antonio [name] 10/12/2021 Medical Record Number: [id number] Patient Account Number: [id number] Date of Birth/Sex: Treating RN: September 10, 1946 (75 y.o. 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

## 2021-10-12 NOTE — Progress Notes (Signed)
Antonio [name], Antonio [name] ([id number]) Visit Report for 10/12/2021 Arrival Information Details Patient Name: Date of Service: Antonio [name], Antonio [name] [place] 10/12/2021 7:30 A M Medical Record Number: [id number] Patient Account Number: [id number] Date of Birth/Sex: Treating RN: Apr 21, 1946 (75 y.o. Antonio [name] Primary Care Gerber Penza: [name] Other Clinician: Referring Kimari Lienhard: Treating Breylon Sherrow/Extender: Krista Blue in Treatment: 9 Visit Information History Since Last Visit Added or deleted any medications: No Patient Arrived: Ambulatory Any new allergies or adverse reactions: No Arrival Time: 07:44 Had a fall or experienced change in No Accompanied By: alone activities of daily living that may affect Transfer Assistance: None risk of falls: Patient Identification Verified: Yes Signs or symptoms of abuse/neglect since last visito No Secondary Verification Process Completed: Yes Hospitalized since last visit: No Patient Requires Transmission-Based Precautions: No Implantable device outside of the clinic excluding No Patient Has Alerts: No cellular tissue based products placed in the center since last visit: Pain Present Now: No Electronic Signature(s) Signed: 10/12/2021 2:54:09 PM By: Levan Hurst RN, BSN Entered By: Levan Hurst on 10/12/2021 08:26:51 -------------------------------------------------------------------------------- Encounter Discharge Information Details Patient Name: Date of Service: Antonio [place] MA S A. 10/12/2021 7:30 A M Medical Record Number: [id number] Patient Account Number: [id number] Date of Birth/Sex: Treating RN: 1945-10-17 (75 y.o. Antonio [name] Primary Care Nylan Nevel: [name] Other Clinician: Referring Kamden Reber: Treating Pinkie Manger/Extender: Krista Blue in Treatment: 9 Encounter Discharge Information Items Discharge Condition: Stable Ambulatory Status: Ambulatory Discharge  Destination: Home Transportation: Private Auto Accompanied By: alone Schedule Follow-up Appointment: Yes Clinical Summary of Care: Patient Declined Electronic Signature(s) Signed: 10/12/2021 2:54:09 PM By: Levan Hurst RN, BSN Entered By: Levan Hurst on 10/12/2021 10:46:54 -------------------------------------------------------------------------------- Vitals Details Patient Name: Date of Service: Antonio [place] MA S A. 10/12/2021 7:30 A M Medical Record Number: [id number] Patient Account Number: [id number] Date of Birth/Sex: Treating RN: 11-21-1945 (75 y.o. Antonio [name] Primary Care Zohan Shiflet: [name] Other Clinician: Referring Carmine Youngberg: Treating Joffre Lucks/Extender: Krista Blue in Treatment: 9 Vital Signs Time Taken: 07:44 Temperature (F): 98.3 Height (in): 67 Pulse (bpm): 63 Weight (lbs): 170 Respiratory Rate (breaths/min): 18 Body Mass Index (BMI): 26.6 Blood Pressure (mmHg): 142/75 Capillary Blood Glucose (mg/dl): 205 Reference Range: 80 - 120 mg / dl Electronic Signature(s) Signed: 10/12/2021 2:54:09 PM By: Levan Hurst RN, BSN Entered By: Levan Hurst on 10/12/2021 08:44:19

## 2021-10-12 NOTE — Progress Notes (Signed)
BRAISON, SNOKE ([id number]) Visit Report for 10/12/2021 HBO Details Patient Name: Date of Service: JILBERTO, VANDERWALL [place] 10/12/2021 7:30 A M Medical Record Number: [id number] Patient Account Number: [id number] Date of Birth/Sex: Treating RN: [birth year] (75 y.o. [name] Primary Care Kaylee Wombles: [name] Other Clinician: Referring Keeon Zurn: Treating Brisia Schuermann/Extender: Krista Blue in Treatment: 9 HBO Treatment Course Details Treatment Course Number: 1 Ordering Marletta Bousquet: Bernerd Pho Treatments Ordered: otal 40 HBO Treatment Start Date: 08/23/2021 HBO Indication: Late Effect of Radiation HBO Treatment Details Treatment Number: 31 Patient Type: Outpatient Chamber Type: Monoplace Chamber Serial #: [id number] Treatment Protocol: 2.0 ATA with 90 minutes oxygen, and no air breaks Treatment Details Compression Rate Down: 2.0 psi / minute De-Compression Rate Up: 2.0 psi / minute Air breaks and breathing Decompress Decompress Compress Tx Pressure Begins Reached periods Begins Ends (leave unused spaces blank) Chamber Pressure (ATA 1 2 ------2 1 ) Clock Time (24 hr) 08:02 08:10 - - - - - - 09:40 09:48 Treatment Length: 106 (minutes) Treatment Segments: 4 Vital Signs Capillary Blood Glucose Reference Range: 80 - 120 mg / dl HBO Diabetic Blood Glucose Intervention Range: <131 mg/dl or >249 mg/dl Time Vitals Blood Respiratory Capillary Blood Glucose Pulse Action Type: Pulse: Temperature: Taken: Pressure: Rate: Glucose (mg/dl): Meter #: Oximetry (%) Taken: Pre 07:44 142/75 63 18 98.3 205 Post 09:48 115/73 59 16 98.6 152 Treatment Response Treatment Completion Status: Treatment Completed without Adverse Event Physician HBO Attestation: I certify that I supervised this HBO treatment in accordance with Medicare guidelines. A trained emergency response team is readily available per Yes hospital policies and procedures. Continue HBOT  as ordered. Yes Electronic Signature(s) Signed: 10/12/2021 1:13:18 PM By: Kalman Shan DO Entered By: Kalman Shan on 10/12/2021 13:11:12 -------------------------------------------------------------------------------- HBO Safety Checklist Details Patient Name: Date of Service: [name] 10/12/2021 7:30 A M Medical Record Number: [id number] Patient Account Number: [id number] Date of Birth/Sex: Treating RN: 11-01-1945 (75 y.o. [name] Primary Care Marlyce Mcdougald: [name] Other Clinician: Referring Jearldean Gutt: Treating Savanah Bayles/Extender: Krista Blue in Treatment: 9 HBO Safety Checklist Items Safety Checklist Consent Form Signed Patient voided / foley secured and emptied When did you last eato 0715 Last dose of injectable or oral agent NA Ostomy pouch emptied and vented if applicable NA All implantable devices assessed, documented and approved NA Intravenous access site secured and place NA Valuables secured Linens and cotton and cotton/polyester blend (less than 51% polyester) Personal oil-based products / skin lotions / body lotions removed Wigs or hairpieces removed Smoking or tobacco materials removed Books / newspapers / magazines / loose paper removed Cologne, aftershave, perfume and deodorant removed Jewelry removed (may wrap wedding band) Make-up removed Hair care products removed Battery operated devices (external) removed Heating patches and chemical warmers removed Titanium eyewear removed NA Nail polish cured greater than 10 hours NA Casting material cured greater than 10 hours NA Hearing aids removed Loose dentures or partials removed Prosthetics have been removed NA Patient demonstrates correct use of air break device (if applicable) Patient concerns have been addressed Patient grounding bracelet on and cord attached to chamber Specifics for Inpatients (complete in addition to above) Medication  sheet sent with patient NA Intravenous medications needed or due during therapy sent with patient NA Drainage tubes (e.g. nasogastric tube or chest tube secured and vented) NA Endotracheal or Tracheotomy tube secured NA Cuff deflated of air and inflated with saline NA Airway suctioned NA Electronic Signature(s) Signed: 10/12/2021  2:54:09 PM By: Levan Hurst RN, BSN Entered By: Levan Hurst on 10/12/2021 08:45:15

## 2021-10-16 ENCOUNTER — Encounter (HOSPITAL_BASED_OUTPATIENT_CLINIC_OR_DEPARTMENT_OTHER): Payer: Medicare Other | Attending: Internal Medicine | Admitting: Internal Medicine

## 2021-10-16 ENCOUNTER — Other Ambulatory Visit: Payer: Self-pay

## 2021-10-16 DIAGNOSIS — Y842 Radiological procedure and radiotherapy as the cause of abnormal reaction of the patient, or of later complication, without mention of misadventure at the time of the procedure: Secondary | ICD-10-CM | POA: Insufficient documentation

## 2021-10-16 DIAGNOSIS — R350 Frequency of micturition: Secondary | ICD-10-CM | POA: Diagnosis not present

## 2021-10-16 DIAGNOSIS — Z8546 Personal history of malignant neoplasm of prostate: Secondary | ICD-10-CM | POA: Insufficient documentation

## 2021-10-16 DIAGNOSIS — N3041 Irradiation cystitis with hematuria: Secondary | ICD-10-CM | POA: Insufficient documentation

## 2021-10-16 LAB — GLUCOSE, CAPILLARY
Glucose-Capillary: 146 mg/dL — ABNORMAL HIGH (ref 70–99)
Glucose-Capillary: 151 mg/dL — ABNORMAL HIGH (ref 70–99)

## 2021-10-16 NOTE — Progress Notes (Signed)
[name], [name] ([id number]) Visit Report for 10/16/2021 SuperBill Details Patient Name: Date of Service: [name], [name] [place] 10/16/2021 Medical Record Number: [id number] Patient Account Number: [id number] Date of Birth/Sex: Treating RN: [birth year] (76 y.o. 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

## 2021-10-16 NOTE — Progress Notes (Addendum)
Antonio [name], BASTOS ([id number]) Visit Report for 10/16/2021 HBO Details Patient Name: Date of Service: Antonio [name], BUDHU [name] 10/16/2021 7:30 A M Medical Record Number: [id number] Patient Account Number: [id number] Date of Birth/Sex: Treating RN: 05/08/1946 (76 y.o. [name] Primary Care Arneta Mahmood: [name] Other Clinician: [name] Referring Naiomy Watters: Treating Haskel Dewalt/Extender: [name] in Treatment: 9 HBO Treatment Course Details Treatment Course Number: 1 Ordering Dahmir Epperly: [name] T Treatments Ordered: otal 40 HBO Treatment Start Date: 08/23/2021 HBO Indication: Late Effect of Radiation HBO Treatment Details Treatment Number: 32 Patient Type: Outpatient Chamber Type: Monoplace Chamber Serial #: [id number] Treatment Protocol: 2.0 ATA with 90 minutes oxygen, and no air breaks Treatment Details Compression Rate Down: 2.0 psi / minute De-Compression Rate Up: 2.0 psi / minute Air breaks and breathing Decompress Decompress Compress Tx Pressure Begins Reached periods Begins Ends (leave unused spaces blank) Chamber Pressure (ATA 1 2 ------2 1 ) Clock Time (24 hr) 08:19 08:27 - - - - - - 09:57 10:05 Treatment Length: 106 (minutes) Treatment Segments: 4 Vital Signs Capillary Blood Glucose Reference Range: 80 - 120 mg / dl HBO Diabetic Blood Glucose Intervention Range: <131 mg/dl or >249 mg/dl Time Vitals Blood Respiratory Capillary Blood Glucose Pulse Action Type: Pulse: Temperature: Taken: Pressure: Rate: Glucose (mg/dl): Meter #: Oximetry (%) Taken: Pre 07:57 130/70 64 18 98.7 146 Post 10:13 133/72 63 18 98.8 151 Treatment Response Treatment Toleration: Well Treatment Completion Status: Treatment Completed without Adverse Event Lessie Funderburke Notes No concerns with treatment given Physician HBO Attestation: I certify that I supervised this HBO treatment in accordance with Medicare guidelines. A trained emergency  response team is readily available per Yes hospital policies and procedures. Continue HBOT as ordered. Yes Electronic Signature(s) Signed: 10/16/2021 3:57:23 PM By: [name] MD Previous Signature: 10/16/2021 11:14:08 AM Version By: [name] CHT EMT BS , , Entered By: [name] on 10/16/2021 14:56:53 -------------------------------------------------------------------------------- HBO Safety Checklist Details Patient Name: Date of Service: [name] 10/16/2021 7:30 A M Medical Record Number: [id number] Patient Account Number: [id number] Date of Birth/Sex: Treating RN: Dec 14, 1945 (76 y.o. [name] Primary Care Veldon Wager: [name] Other Clinician: [name] Referring Selen Smucker: Treating Jaycen Vercher/Extender: [name] in Treatment: 9 HBO Safety Checklist Items Safety Checklist Consent Form Signed Patient voided / foley secured and emptied When did you last eato 0645 Last dose of injectable or oral agent 9937 Ostomy pouch emptied and vented if applicable NA All implantable devices assessed, documented and approved NA Intravenous access site secured and place NA Valuables secured Linens and cotton and cotton/polyester blend (less than 51% polyester) Personal oil-based products / skin lotions / body lotions removed Wigs or hairpieces removed Smoking or tobacco materials removed NA Books / newspapers / magazines / loose paper removed NA Cologne, aftershave, perfume and deodorant removed Jewelry removed (may wrap wedding band) Make-up removed NA Hair care products removed NA Battery operated devices (external) removed Heating patches and chemical warmers removed Titanium eyewear removed NA Nail polish cured greater than 10 hours NA Casting material cured greater than 10 hours NA Hearing aids removed NA Loose dentures or partials removed dentures removed Prosthetics have been  removed NA Patient demonstrates correct use of air break device (if applicable) Patient concerns have been addressed Patient grounding bracelet on and cord attached to chamber Specifics for Inpatients (complete in addition to above) Medication sheet sent with patient NA Intravenous medications needed or due during therapy sent with patient  NA Drainage tubes (e.g. nasogastric tube or chest tube secured and vented) NA Endotracheal or Tracheotomy tube secured NA Cuff deflated of air and inflated with saline NA Airway suctioned NA Notes Paper version used prior to treatment. Electronic Signature(s) Signed: 10/16/2021 1:50:39 PM By: [name] CHT EMT BS , , Previous Signature: 10/16/2021 9:15:26 AM Version By: [name] CHT EMT BS , , Entered By: [name] on 10/16/2021 13:50:38

## 2021-10-16 NOTE — Progress Notes (Addendum)
BAILY, SERPE ([id number]) Visit Report for 10/16/2021 Arrival Information Details Patient Name: Date of Service: FREDERICO, GERLING [name] 10/16/2021 7:30 A M Medical Record Number: [id number] Patient Account Number: [id number] Date of Birth/Sex: Treating RN: 18-Oct-1945 (76 y.o. [name] Primary Care Bethanee Redondo: [name] Other Clinician: [name] Referring Atreus Hasz: Treating Amahd Morino/Extender: [name] in Treatment: 9 Visit Information History Since Last Visit All ordered tests and consults were completed: Yes Patient Arrived: Ambulatory Added or deleted any medications: No Arrival Time: 07:48 Any new allergies or adverse reactions: No Accompanied By: self Had a fall or experienced change in No Transfer Assistance: None activities of daily living that may affect Patient Identification Verified: Yes risk of falls: Secondary Verification Process Completed: Yes Signs or symptoms of abuse/neglect since last visito No Patient Requires Transmission-Based Precautions: No Hospitalized since last visit: No Patient Has Alerts: No Pain Present Now: No Electronic Signature(s) Signed: 10/16/2021 9:08:40 AM By: [name] CHT EMT BS , , Entered By: [name] on 10/16/2021 09:08:39 -------------------------------------------------------------------------------- Encounter Discharge Information Details Patient Name: Date of Service: [name] 10/16/2021 7:30 A M Medical Record Number: [id number] Patient Account Number: [id number] Date of Birth/Sex: Treating RN: 03-12-1946 (76 y.o. [name] Primary Care Kindell Strada: [name] Other Clinician: [name] Referring Deyra Perdomo: Treating Lira Stephen/Extender: [name] in Treatment: 9 Encounter Discharge Information Items Discharge Condition: Stable Ambulatory Status: Ambulatory Discharge Destination: Home Transportation:  Private Auto Accompanied By: self Schedule Follow-up Appointment: No Clinical Summary of Care: Electronic Signature(s) Signed: 10/17/2021 9:15:26 AM By: [name] CHT EMT BS , , Entered By: [name] on 10/16/2021 11:15:20 -------------------------------------------------------------------------------- Vitals Details Patient Name: Date of Service: [name] 10/16/2021 7:30 A M Medical Record Number: [id number] Patient Account Number: [id number] Date of Birth/Sex: Treating RN: 10-Sep-1946 (75 y.o. [name] Primary Care Dosia Yodice: [name] Other Clinician: [name] Referring Opaline Reyburn: Treating Saurabh Hettich/Extender: [name] in Treatment: 9 Vital Signs Time Taken: 07:57 Temperature (F): 98.7 Height (in): 67 Pulse (bpm): 64 Weight (lbs): 170 Respiratory Rate (breaths/min): 18 Body Mass Index (BMI): 26.6 Blood Pressure (mmHg): 130/70 Capillary Blood Glucose (mg/dl): 146 Reference Range: 80 - 120 mg / dl Electronic Signature(s) Signed: 10/16/2021 9:12:02 AM By: [name] CHT EMT BS , , Entered By: [name] on 10/16/2021 09:12:02

## 2021-10-17 ENCOUNTER — Encounter (HOSPITAL_BASED_OUTPATIENT_CLINIC_OR_DEPARTMENT_OTHER): Payer: Medicare Other | Admitting: Physician Assistant

## 2021-10-17 DIAGNOSIS — N3041 Irradiation cystitis with hematuria: Secondary | ICD-10-CM | POA: Diagnosis not present

## 2021-10-17 LAB — GLUCOSE, CAPILLARY
Glucose-Capillary: 112 mg/dL — ABNORMAL HIGH (ref 70–99)
Glucose-Capillary: 137 mg/dL — ABNORMAL HIGH (ref 70–99)
Glucose-Capillary: 101 mg/dL — ABNORMAL HIGH (ref 70–99)

## 2021-10-17 NOTE — Progress Notes (Signed)
[name], [name] ([id number]) Visit Report for 10/17/2021 SuperBill Details Patient Name: Date of Service: Antonio [name], Antonio [name] [place] 10/17/2021 Medical Record Number: [id number] Patient Account Number: [id number] Date of Birth/Sex: Treating RN: [birth year] (76 y.o. 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

## 2021-10-17 NOTE — Progress Notes (Addendum)
JAHN, FRANCHINI ([id number]) Visit Report for 10/17/2021 Arrival Information Details Patient Name: Date of Service: KOREY, PRASHAD [name] 10/17/2021 7:30 A M Medical Record Number: [id number] Patient Account Number: [id number] Date of Birth/Sex: Treating RN: Apr 18, 1946 (76 y.o. [name], [name] Primary Care Lilyanna Lunt: [name] Other Clinician: [name] Referring Halynn Reitano: Treating Justinian Miano/Extender: [name] in Treatment: 9 Visit Information History Since Last Visit All ordered tests and consults were completed: Yes Patient Arrived: Ambulatory Added or deleted any medications: No Arrival Time: 07:37 Any new allergies or adverse reactions: No Accompanied By: self Had a fall or experienced change in No Transfer Assistance: None activities of daily living that may affect Patient Identification Verified: Yes risk of falls: Secondary Verification Process Completed: Yes Signs or symptoms of abuse/neglect since last visito No Patient Requires Transmission-Based Precautions: No Hospitalized since last visit: No Patient Has Alerts: No Implantable device outside of the clinic excluding No cellular tissue based products placed in the center since last visit: Pain Present Now: No Electronic Signature(s) Signed: 10/17/2021 8:38:40 AM By: [name] CHT EMT BS , , Entered By: [name] on 10/17/2021 08:38:39 -------------------------------------------------------------------------------- Encounter Discharge Information Details Patient Name: Date of Service: [name] 10/17/2021 7:30 A M Medical Record Number: [id number] Patient Account Number: [id number] Date of Birth/Sex: Treating RN: 07-Nov-1945 (76 y.o. [name] Primary Care Kito Cuffe: [name] Other Clinician: [name] Referring Darolyn Double: Treating Kashauna Celmer/Extender: [name] in Treatment: 9 Encounter Discharge  Information Items Discharge Condition: Stable Ambulatory Status: Ambulatory Discharge Destination: Home Transportation: Private Auto Accompanied By: self Schedule Follow-up Appointment: No Clinical Summary of Care: Electronic Signature(s) Signed: 10/17/2021 11:16:14 AM By: [name] CHT EMT BS , , Entered By: [name] on 10/17/2021 11:16:14 -------------------------------------------------------------------------------- Vitals Details Patient Name: Date of Service: [name] 10/17/2021 7:30 A M Medical Record Number: [id number] Patient Account Number: [id number] Date of Birth/Sex: Treating RN: 1946/06/30 (76 y.o. [name] Primary Care Dailynn Nancarrow: [name] Other Clinician: [name] Referring Jaggar Benko: Treating Chery Giusto/Extender: [name] in Treatment: 9 Vital Signs Time Taken: 07:44 Temperature (F): 98.5 Height (in): 67 Pulse (bpm): 62 Weight (lbs): 170 Respiratory Rate (breaths/min): 18 Body Mass Index (BMI): 26.6 Blood Pressure (mmHg): 101/66 Capillary Blood Glucose (mg/dl): 112 Reference Range: 80 - 120 mg / dl Electronic Signature(s) Signed: 10/17/2021 8:39:30 AM By: [name] CHT EMT BS , , Entered By: [name] on 10/17/2021 08:39:30

## 2021-10-17 NOTE — Progress Notes (Addendum)
Antonio, [name] ([id number]) Visit Report for 10/17/2021 HBO Details Patient Name: Date of Service: Antonio [name], Antonio [name] [name] 10/17/2021 7:30 A M Medical Record Number: [id number] Patient Account Number: [id number] Date of Birth/Sex: Treating RN: 02/17/1946 (76 y.o. [name] Primary Care Kaden Daughdrill: [name] Other Clinician: [name] Referring Garrett Mitchum: Treating Randi [employer]/Extender: [name] in Treatment: 9 HBO Treatment Course Details Treatment Course Number: 1 Ordering Matej Sappenfield: [name] T Treatments Ordered: otal 40 HBO Treatment Start Date: 08/23/2021 HBO Indication: Late Effect of Radiation HBO Treatment Details Treatment Number: 33 Patient Type: Outpatient Chamber Type: Monoplace Chamber Serial #: [id number] Treatment Protocol: 2.0 ATA with 90 minutes oxygen, and no air breaks Treatment Details Compression Rate Down: 2.0 psi / minute De-Compression Rate Up: 2.0 psi / minute Air breaks and breathing Decompress Decompress Compress Tx Pressure Begins Reached periods Begins Ends (leave unused spaces blank) Chamber Pressure (ATA 1 2 ------2 1 ) Clock Time (24 hr) 08:20 08:28 - - - - - - 09:58 10:06 Treatment Length: 106 (minutes) Treatment Segments: 4 Vital Signs Capillary Blood Glucose Reference Range: 80 - 120 mg / dl HBO Diabetic Blood Glucose Intervention Range: <131 mg/dl or >249 mg/dl Type: Time Vitals Blood Pulse: Respiratory Temperature: Capillary Blood Glucose Pulse Action Taken: Pressure: Rate: Glucose (mg/dl): Meter #: Oximetry (%) Taken: Pre 07:44 101/66 62 18 98.5 112 0748 Given 8 oz Glucerna Pre 08:18 137 Cleared for HBO Post 10:10 101/66 60 18 98.7 101 > or = 101, discharge per protocol Treatment Response Treatment Toleration: Well Treatment Completion Status: Treatment Completed without Adverse Event Electronic Signature(s) Signed: 10/17/2021 11:19:22 AM By: [name] CHT EMT BS ,  , Signed: 10/17/2021 4:51:59 PM By: [name] Previous Signature: 10/17/2021 11:08:41 AM Version By: [name] CHT EMT BS , , Previous Signature: 10/17/2021 9:15:26 AM Version By: [name] CHT EMT BS , , Entered By: [name] on 10/17/2021 11:19:20 -------------------------------------------------------------------------------- HBO Safety Checklist Details Patient Name: Date of Service: Antonio Baltimore MA S A. 10/17/2021 7:30 A M Medical Record Number: [id number] Patient Account Number: [id number] Date of Birth/Sex: Treating RN: [birth year] (76 y.o. [name] Primary Care [place] Virrueta: [name] Other Clinician: [name] Referring [name]: Treating Terrie Haring/Extender: [name] in Treatment: 9 HBO Safety Checklist Items Safety Checklist Consent Form Signed Patient voided / foley secured and emptied When did you last eato 0645 Last dose of injectable or oral agent 0258 Ostomy pouch emptied and vented if applicable NA All implantable devices assessed, documented and approved NA Intravenous access site secured and place NA Valuables secured Linens and cotton and cotton/polyester blend (less than 51% polyester) Personal oil-based products / skin lotions / body lotions removed Wigs or hairpieces removed NA Smoking or tobacco materials removed NA Books / newspapers / magazines / loose paper removed Cologne, aftershave, perfume and deodorant removed Jewelry removed (may wrap wedding band) Make-up removed NA Hair care products removed Battery operated devices (external) removed Heating patches and chemical warmers removed Titanium eyewear removed NA Nail polish cured greater than 10 hours NA Casting material cured greater than 10 hours NA Hearing aids removed NA Loose dentures or partials removed dentures removed Prosthetics have been removed NA Patient demonstrates correct use of air break  device (if applicable) Patient concerns have been addressed Patient grounding bracelet on and cord attached to chamber Specifics for Inpatients (complete in addition to above) Medication sheet sent with patient NA Intravenous medications needed or due during  therapy sent with patient NA Drainage tubes (e.g. nasogastric tube or chest tube secured and vented) NA Endotracheal or Tracheotomy tube secured NA Cuff deflated of air and inflated with saline NA Airway suctioned NA Notes Paper version used prior to treatment. Electronic Signature(s) Signed: 10/17/2021 8:41:55 AM By: [name] CHT EMT BS , , Previous Signature: 10/17/2021 8:41:26 AM Version By: [name] CHT EMT BS , , Entered By: [name] on 10/17/2021 08:41:55

## 2021-10-18 ENCOUNTER — Encounter (HOSPITAL_BASED_OUTPATIENT_CLINIC_OR_DEPARTMENT_OTHER): Payer: Medicare Other | Admitting: Internal Medicine

## 2021-10-18 ENCOUNTER — Other Ambulatory Visit: Payer: Self-pay

## 2021-10-18 DIAGNOSIS — N3041 Irradiation cystitis with hematuria: Secondary | ICD-10-CM | POA: Diagnosis not present

## 2021-10-18 LAB — GLUCOSE, CAPILLARY
Glucose-Capillary: 232 mg/dL — ABNORMAL HIGH (ref 70–99)
Glucose-Capillary: 154 mg/dL — ABNORMAL HIGH (ref 70–99)

## 2021-10-18 NOTE — Progress Notes (Signed)
ARN, MCOMBER ([id number]) Visit Report for 10/18/2021 Problem List Details Patient Name: Date of Service: Antonio, [name] [name] 10/18/2021 7:30 A M Medical Record Number: [id number] Patient Account Number: [id number] Date of Birth/Sex: Treating RN: [birth year] (76 y.o. 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 10/18/2021 Medical Record Number: [id number] Patient Account Number: [id number] Date of Birth/Sex: Treating RN: [birth year] (76 y.o. 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

## 2021-10-18 NOTE — Progress Notes (Addendum)
DMARION, PERFECT ([id number]) Visit Report for 10/18/2021 Arrival Information Details Patient Name: Date of Service: Antonio [name], Antonio [name] [name] 10/18/2021 7:30 A M Medical Record Number: [id number] Patient Account Number: [id number] Date of Birth/Sex: Treating RN: 16-Dec-1945 (76 y.o. Antonio [name], [name] Primary Care Landry Kamath: [name] Other Clinician: [name] Referring Tallula Grindle: Treating Rhayne Chatwin/Extender: [name] in Treatment: 10 Visit Information History Since Last Visit All ordered tests and consults were completed: Yes Patient Arrived: Ambulatory Added or deleted any medications: No Arrival Time: 07:45 Any new allergies or adverse reactions: No Accompanied By: self Had a fall or experienced change in No Transfer Assistance: None activities of daily living that may affect Patient Identification Verified: Yes risk of falls: Secondary Verification Process Completed: Yes Signs or symptoms of abuse/neglect since last visito No Patient Requires Transmission-Based Precautions: No Hospitalized since last visit: No Patient Has Alerts: No Implantable device outside of the clinic excluding No cellular tissue based products placed in the center since last visit: Pain Present Now: No Electronic Signature(s) Signed: 10/18/2021 10:57:26 AM By: [name] CHT EMT BS , , Entered By: [name] on 10/18/2021 10:57:25 -------------------------------------------------------------------------------- Encounter Discharge Information Details Patient Name: Date of Service: Antonio Baltimore MA S A. 10/18/2021 7:30 A M Medical Record Number: [id number] Patient Account Number: [id number] Date of Birth/Sex: Treating RN: May 19, 1946 (76 y.o. [name] Primary Care Sady Monaco: [name] Other Clinician: [name] Referring Frandy Basnett: Treating Celenia Hruska/Extender: [name] in Treatment: 10 Encounter Discharge  Information Items Discharge Condition: Stable Ambulatory Status: Ambulatory Discharge Destination: Home Transportation: Private Auto Accompanied By: self Schedule Follow-up Appointment: No Clinical Summary of Care: Electronic Signature(s) Signed: 10/18/2021 11:01:29 AM By: [name] CHT EMT BS , , Entered By: [name] on 10/18/2021 11:01:28 -------------------------------------------------------------------------------- Vitals Details Patient Name: Date of Service: Antonio [place] MA S A. 10/18/2021 7:30 A M Medical Record Number: [id number] Patient Account Number: [id number] Date of Birth/Sex: Treating RN: 03-01-1946 (76 y.o. Antonio [name], [name] Primary Care Ayansh Feutz: [name] Other Clinician: [name] Referring Lametria Klunk: Treating Cheryl Stabenow/Extender: [name] in Treatment: 10 Vital Signs Time Taken: 07:49 Temperature (F): 98.1 Height (in): 67 Pulse (bpm): 60 Weight (lbs): 170 Respiratory Rate (breaths/min): 16 Body Mass Index (BMI): 26.6 Blood Pressure (mmHg): 107/67 Capillary Blood Glucose (mg/dl): 232 Reference Range: 80 - 120 mg / dl Electronic Signature(s) Signed: 10/18/2021 10:58:01 AM By: [name] CHT EMT BS , , Entered By: [name] on 10/18/2021 10:58:00

## 2021-10-18 NOTE — Progress Notes (Addendum)
Antonio [name] ([id number]) Visit Report for 10/18/2021 HBO Details Patient Name: Date of Service: Antonio [name], Antonio [name] [name] 10/18/2021 7:30 A M Medical Record Number: [id number] Patient Account Number: [id number] Date of Birth/Sex: Treating RN: Mar 17, 1946 (76 y.o. Antonio [name], Antonio [name] Primary Care Antonio [name]: Antonio [name] Other Clinician: Donavan [name] Referring Antonio [name]: Treating Antonio [name]/Extender: Antonio [name] in Treatment: 10 HBO Treatment Course Details Treatment Course Number: 1 Ordering Lileigh Fahringer: Bernerd Pho Treatments Ordered: otal 40 HBO Treatment Start Date: 08/23/2021 HBO Indication: Late Effect of Radiation HBO Treatment Details Treatment Number: 34 Patient Type: Outpatient Chamber Type: Monoplace Chamber Serial #: [id number] Treatment Protocol: 2.0 ATA with 90 minutes oxygen, and no air breaks Treatment Details Compression Rate Down: 2.0 psi / minute De-Compression Rate Up: 2.0 psi / minute Air breaks and breathing Decompress Decompress Compress Tx Pressure Begins Reached periods Begins Ends (leave unused spaces blank) Chamber Pressure (ATA 1 2 ------2 1 ) Clock Time (24 hr) 08:14 08:22 - - - - - - 09:52 10:00 Treatment Length: 106 (minutes) Treatment Segments: 4 Vital Signs Capillary Blood Glucose Reference Range: 80 - 120 mg / dl HBO Diabetic Blood Glucose Intervention Range: <131 mg/dl or >249 mg/dl Time Vitals Blood Respiratory Capillary Blood Glucose Pulse Action Type: Pulse: Temperature: Taken: Pressure: Rate: Glucose (mg/dl): Meter #: Oximetry (%) Taken: Pre 07:49 107/67 60 16 98.1 232 Post 10:07 126/69 58 16 98.1 154 Treatment Response Treatment Toleration: Well Treatment Completion Status: Treatment Completed without Adverse Event Antonio [name] Notes . No concerns with treatment given Physician HBO Attestation: I certify that I supervised this HBO treatment in accordance with Medicare guidelines. A trained  emergency response team is readily available per Yes hospital policies and procedures. Continue HBOT as ordered. Yes Electronic Signature(s) Signed: 10/18/2021 4:38:12 PM By: Antonio Ham MD Previous Signature: 10/18/2021 11:00:21 AM Version By: Antonio [name] CHT EMT BS , , Entered By: Antonio [name] on 10/18/2021 16:33:18 -------------------------------------------------------------------------------- HBO Safety Checklist Details Patient Name: Date of Service: Antonio [place] MA S A. 10/18/2021 7:30 A M Medical Record Number: [id number] Patient Account Number: [id number] Date of Birth/Sex: Treating RN: February 07, 1946 (76 y.o. Antonio [name], Antonio [name] Primary Care Antonio [name]: Antonio [name] Other Clinician: Donavan [name] Referring Antonio [name]: Treating Antonio [name]/Extender: Antonio [name] in Treatment: 10 HBO Safety Checklist Items Safety Checklist Consent Form Signed Patient voided / foley secured and emptied When did you last eato 0645 Last dose of injectable or oral agent 0263 Ostomy pouch emptied and vented if applicable NA All implantable devices assessed, documented and approved NA Intravenous access site secured and place NA Valuables secured Linens and cotton and cotton/polyester blend (less than 51% polyester) Personal oil-based products / skin lotions / body lotions removed Wigs or hairpieces removed NA Smoking or tobacco materials removed NA Books / newspapers / magazines / loose paper removed Cologne, aftershave, perfume and deodorant removed Jewelry removed (may wrap wedding band) Make-up removed NA Hair care products removed Battery operated devices (external) removed Heating patches and chemical warmers removed Titanium eyewear removed NA Nail polish cured greater than 10 hours NA Casting material cured greater than 10 hours NA Hearing aids removed Loose dentures or partials removed dentures removed Prosthetics have been  removed NA Patient demonstrates correct use of air break device (if applicable) Patient concerns have been addressed Patient grounding bracelet on and cord attached to chamber Specifics for Inpatients (complete in addition to above) Medication sheet sent with patient NA Intravenous medications needed or due during therapy sent with patient NA  Drainage tubes (e.g. nasogastric tube or chest tube secured and vented) NA Endotracheal or Tracheotomy tube secured NA Cuff deflated of air and inflated with saline NA Airway suctioned NA Notes Paper version used prior to treatment. Electronic Signature(s) Signed: 10/18/2021 1:09:52 PM By: Antonio [name] CHT EMT BS , , Previous Signature: 10/18/2021 10:59:19 AM Version By: Antonio [name] CHT EMT BS , , Entered By: Antonio [name] on 10/18/2021 13:09:51

## 2021-10-19 ENCOUNTER — Encounter (HOSPITAL_BASED_OUTPATIENT_CLINIC_OR_DEPARTMENT_OTHER): Payer: Medicare Other | Admitting: Internal Medicine

## 2021-10-19 DIAGNOSIS — Z8546 Personal history of malignant neoplasm of prostate: Secondary | ICD-10-CM | POA: Diagnosis not present

## 2021-10-19 DIAGNOSIS — R350 Frequency of micturition: Secondary | ICD-10-CM | POA: Diagnosis not present

## 2021-10-19 DIAGNOSIS — N3041 Irradiation cystitis with hematuria: Secondary | ICD-10-CM | POA: Diagnosis not present

## 2021-10-19 LAB — GLUCOSE, CAPILLARY
Glucose-Capillary: 229 mg/dL — ABNORMAL HIGH (ref 70–99)
Glucose-Capillary: 186 mg/dL — ABNORMAL HIGH (ref 70–99)

## 2021-10-19 NOTE — Progress Notes (Addendum)
ALEZANDER, DIMAANO ([id number]) Visit Report for 10/19/2021 Arrival Information Details Patient Name: Date of Service: JJESUS, DINGLEY [name] 10/19/2021 8:00 A M Medical Record Number: [id number] Patient Account Number: [id number] Date of Birth/Sex: Treating RN: Jun 06, 1946 (76 y.o. [name], [name] Primary Care Varick Keys: [name] Other Clinician: [name] Referring Addison Whidbee: Treating Bernyce Brimley/Extender: Krista Blue in Treatment: 10 Visit Information History Since Last Visit All ordered tests and consults were completed: Yes Patient Arrived: Ambulatory Added or deleted any medications: No Arrival Time: 07:36 Any new allergies or adverse reactions: No Accompanied By: Self Had a fall or experienced change in No Transfer Assistance: None activities of daily living that may affect Patient Identification Verified: Yes risk of falls: Secondary Verification Process Completed: Yes Signs or symptoms of abuse/neglect since last visito No Patient Requires Transmission-Based Precautions: No Hospitalized since last visit: No Patient Has Alerts: No Implantable device outside of the clinic excluding No cellular tissue based products placed in the center since last visit: Pain Present Now: No Electronic Signature(s) Signed: 10/19/2021 9:33:38 AM By: [name] EMT Entered By: [name] on 10/19/2021 09:33:38 -------------------------------------------------------------------------------- Encounter Discharge Information Details Patient Name: Date of Service: [name] 10/19/2021 8:00 A M Medical Record Number: [id number] Patient Account Number: [id number] Date of Birth/Sex: Treating RN: Oct 18, 1945 (76 y.o. [name] Primary Care [place] Rios: [name] Other Clinician: [name] Referring Tamekia Rotter: Treating Londan Coplen/Extender: Krista Blue in Treatment: 10 Encounter Discharge Information  Items Discharge Condition: Stable Ambulatory Status: Ambulatory Discharge Destination: Home Transportation: Private Auto Accompanied By: None Schedule Follow-up Appointment: Yes Clinical Summary of Care: Patient Declined Electronic Signature(s) Signed: 10/19/2021 10:48:44 AM By: [name] EMT Entered By: [name] on 10/19/2021 10:48:44 -------------------------------------------------------------------------------- Patient/Caregiver Education Details Patient Name: Date of Service: Ky Barban 1/6/2023andnbsp8:00 [place] Record Number: [id number] Patient Account Number: [id number] Date of Birth/Gender: Treating RN: 11/06/1945 (76 y.o. [name] Primary Care Physician: [name] Other Clinician: [name] Referring Physician: Treating Physician/Extender: Krista Blue in Treatment: 10 Education Assessment Education Provided To: Patient Education Topics Provided Electronic Signature(s) Signed: 10/19/2021 10:48:06 AM By: [name] EMT Entered By: [name] on 10/19/2021 10:48:06 -------------------------------------------------------------------------------- Vitals Details Patient Name: Date of Service: [name] 10/19/2021 8:00 A M Medical Record Number: [id number] Patient Account Number: [id number] Date of Birth/Sex: Treating RN: 1946-01-19 (76 y.o. [name] Primary Care Sage Hammill: [name] Other Clinician: [name] Referring Aarish Rockers: Treating Isabele Lollar/Extender: Krista Blue in Treatment: 10 Vital Signs Time Taken: 07:36 Temperature (F): 98.8 Height (in): 67 Pulse (bpm): 66 Weight (lbs): 170 Respiratory Rate (breaths/min): 16 Body Mass Index (BMI): 26.6 Blood Pressure (mmHg): 142/75 Capillary Blood Glucose (mg/dl): 229 Reference Range: 80 - 120 mg / dl Electronic Signature(s) Signed: 10/19/2021 9:34:41 AM By: [name]  EMT Entered By: [name] on 10/19/2021 09:34:40

## 2021-10-19 NOTE — Progress Notes (Addendum)
Antonio, [name] ([id number]) Visit Report for 10/19/2021 HBO Details Patient Name: Date of Service: Antonio [name], Antonio [name] [name] 10/19/2021 8:00 A M Medical Record Number: [id number] Patient Account Number: [id number] Date of Birth/Sex: Treating RN: 11-21-1945 (76 y.o. Antonio [name] Primary Care Stephanos Fan: [name] Other Clinician: [name] Referring Emari Demmer: Treating Kaz Auld/Extender: Krista Blue in Treatment: 10 HBO Treatment Course Details Treatment Course Number: 1 Ordering Kadyn Guild: [name] T Treatments Ordered: otal 40 HBO Treatment Start Date: 08/23/2021 HBO Indication: Late Effect of Radiation HBO Treatment Details Treatment Number: 35 Patient Type: Outpatient Chamber Type: Monoplace Chamber Serial #: [id number] Treatment Protocol: 2.0 ATA with 90 minutes oxygen, and no air breaks Treatment Details Compression Rate Down: 2.0 psi / minute De-Compression Rate Up: 2.0 psi / minute Air breaks and breathing Decompress Decompress Compress Tx Pressure Begins Reached periods Begins Ends (leave unused spaces blank) Chamber Pressure (ATA 1 2 ------2 1 ) Clock Time (24 hr) 08:21 08:29 - - - - - - 09:59 10:07 Treatment Length: 106 (minutes) Treatment Segments: 4 Vital Signs Capillary Blood Glucose Reference Range: 80 - 120 mg / dl HBO Diabetic Blood Glucose Intervention Range: <131 mg/dl or >249 mg/dl Time Vitals Blood Respiratory Capillary Blood Glucose Pulse Action Type: Pulse: Temperature: Taken: Pressure: Rate: Glucose (mg/dl): Meter #: Oximetry (%) Taken: Pre 07:36 142/75 66 16 98.8 229 Post 10:12 115/70 59 16 98.6 186 Treatment Response Treatment Toleration: Well Treatment Completion Status: Treatment Completed without Adverse Event Treatment Notes I certify that I directed the operation of said chamber for this treatment. MJScammell Electronic Signature(s) Signed: 10/19/2021 2:02:15 PM By: [name] CHT  EMT BS , , Signed: 10/22/2021 10:06:40 AM By: Kalman Shan DO Previous Signature: 10/19/2021 1:54:55 PM Version By: [name] CHT EMT BS , , Previous Signature: 10/19/2021 12:07:41 PM Version By: [name] CHT EMT BS , , Previous Signature: 10/19/2021 12:18:20 PM Version By: Kalman Shan DO Previous Signature: 10/19/2021 10:45:19 AM Version By: [name] EMT Entered By: [name] on 10/19/2021 14:02:15 -------------------------------------------------------------------------------- HBO Safety Checklist Details Patient Name: Date of Service: Antonio [place] MA S A. 10/19/2021 8:00 A M Medical Record Number: [id number] Patient Account Number: [id number] Date of Birth/Sex: Treating RN: [birth year] (76 y.o. Antonio [name] Primary Care Idamae Coccia: [name] Other Clinician: [name] Referring Sukanya Goldblatt: Treating Madysun Thall/Extender: Krista Blue in Treatment: 10 HBO Safety Checklist Items Safety Checklist Consent Form Signed Patient voided / foley secured and emptied When did you last eato 0630 Last dose of injectable or oral agent 0630 Ostomy pouch emptied and vented if applicable NA All implantable devices assessed, documented and approved NA Intravenous access site secured and place NA Valuables secured Linens and cotton and cotton/polyester blend (less than 51% polyester) Personal oil-based products / skin lotions / body lotions removed Wigs or hairpieces removed NA Smoking or tobacco materials removed Books / newspapers / magazines / loose paper removed Cologne, aftershave, perfume and deodorant removed Jewelry removed (may wrap wedding band) Make-up removed NA Hair care products removed Battery operated devices (external) removed Heating patches and chemical warmers removed Titanium eyewear removed NA Nail polish cured greater than 10 hours NA Casting material cured greater than 10 hours NA Hearing aids  removed NA Loose dentures or partials removed NA Prosthetics have been removed NA Patient demonstrates correct use of air break device (if applicable) Patient concerns have been addressed Patient seen by Dr. Heber Guffey Patient grounding bracelet on and cord attached to chamber Specifics for  Inpatients (complete in addition to above) Medication sheet sent with patient NA Intravenous medications needed or due during therapy sent with patient NA Drainage tubes (e.g. nasogastric tube or chest tube secured and vented) NA Endotracheal or Tracheotomy tube secured NA Cuff deflated of air and inflated with saline NA Airway suctioned NA Notes Information entered from paper check in sheet. I certify that I directed the performance of the safety check for this treatment. MJScammell Electronic Signature(s) Signed: 10/19/2021 1:55:46 PM By: [name] CHT EMT BS , , Previous Signature: 10/19/2021 12:06:32 PM Version By: [name] CHT EMT BS , , Previous Signature: 10/19/2021 9:40:55 AM Version By: [name] EMT Entered By: [name] on 10/19/2021 13:55:46

## 2021-10-19 NOTE — Progress Notes (Signed)
Antonio [name], Antonio [name] ([id number]) Visit Report for 10/19/2021 Physician Orders Details Patient Name: Date of Service: Antonio [name], Antonio [name] [name] 10/19/2021 8:00 A M Medical Record Number: [id number] Patient Account Number: [id number] Date of Birth/Sex: Treating RN: 04-Apr-1946 (76 y.o. 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 10/19/2021 8:00 A M Medical Record Number: [id number] Patient Account Number: [id number] Date of Birth/Sex: Treating RN: 27-Apr-1946 (76 y.o. 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 10/19/2021 Medical Record Number: [id number] Patient Account Number: [id number] Date of Birth/Sex: Treating RN: [birth year] (76 y.o. 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

## 2021-10-22 ENCOUNTER — Encounter: Payer: Self-pay | Admitting: Cardiovascular Disease

## 2021-10-22 ENCOUNTER — Ambulatory Visit: Payer: Medicare HMO | Admitting: Cardiology

## 2021-10-22 ENCOUNTER — Other Ambulatory Visit: Payer: Self-pay

## 2021-10-22 ENCOUNTER — Encounter (HOSPITAL_BASED_OUTPATIENT_CLINIC_OR_DEPARTMENT_OTHER): Payer: Medicare Other | Admitting: Internal Medicine

## 2021-10-22 DIAGNOSIS — R350 Frequency of micturition: Secondary | ICD-10-CM

## 2021-10-22 DIAGNOSIS — N3041 Irradiation cystitis with hematuria: Secondary | ICD-10-CM

## 2021-10-22 DIAGNOSIS — Z8546 Personal history of malignant neoplasm of prostate: Secondary | ICD-10-CM

## 2021-10-22 LAB — GLUCOSE, CAPILLARY
Glucose-Capillary: 229 mg/dL — ABNORMAL HIGH (ref 70–99)
Glucose-Capillary: 191 mg/dL — ABNORMAL HIGH (ref 70–99)

## 2021-10-22 NOTE — Progress Notes (Signed)
PRADEEP, BEAUBRUN ([id number]) Visit Report for 10/22/2021 Arrival Information Details Patient Name: Date of Service: FRANCESCO, PROVENCAL [name] 10/22/2021 7:30 A M Medical Record Number: [id number] Patient Account Number: [id number] Date of Birth/Sex: Treating RN: 03/20/1946 (76 y.o. [name] Primary Care Tom Ragsdale: [name] Other Clinician: [name] Referring Keyaira Clapham: Treating Vincie Linn/Extender: Krista Blue in Treatment: 10 Visit Information History Since Last Visit All ordered tests and consults were completed: Yes Patient Arrived: Ambulatory Added or deleted any medications: No Arrival Time: 07:44 Any new allergies or adverse reactions: No Accompanied By: self Had a fall or experienced change in No Transfer Assistance: None activities of daily living that may affect Patient Identification Verified: Yes risk of falls: Secondary Verification Process Completed: Yes Signs or symptoms of abuse/neglect since last visito No Patient Requires Transmission-Based Precautions: No Hospitalized since last visit: No Patient Has Alerts: No Implantable device outside of the clinic excluding No cellular tissue based products placed in the center since last visit: Pain Present Now: No Electronic Signature(s) Signed: 10/22/2021 11:24:12 AM By: [name] CHT EMT BS , , Entered By: [name] on 10/22/2021 11:24:11 -------------------------------------------------------------------------------- Encounter Discharge Information Details Patient Name: Date of Service: [name] 10/22/2021 7:30 A M Medical Record Number: [id number] Patient Account Number: [id number] Date of Birth/Sex: Treating RN: 1946/03/04 (76 y.o. [name] Primary Care Amaad Byers: [name] Other Clinician: [name] Referring Oakley Orban: Treating Peyten Weare/Extender: Krista Blue in Treatment: 10 Encounter  Discharge Information Items Discharge Condition: Stable Ambulatory Status: Ambulatory Discharge Destination: Home Transportation: Private Auto Accompanied By: self Schedule Follow-up Appointment: No Clinical Summary of Care: Electronic Signature(s) Signed: 10/22/2021 11:31:30 AM By: [name] CHT EMT BS , , Entered By: [name] on 10/22/2021 11:31:29 -------------------------------------------------------------------------------- Vitals Details Patient Name: Date of Service: [name] 10/22/2021 7:30 A M Medical Record Number: [id number] Patient Account Number: [id number] Date of Birth/Sex: Treating RN: 12-Aug-1946 (76 y.o. [name] Primary Care Maximino Cozzolino: [name] Other Clinician: [name] Referring Lillyanna Glandon: Treating Knox Cervi/Extender: Krista Blue in Treatment: 10 Vital Signs Time Taken: 07:46 Temperature (F): 98.6 Height (in): 67 Pulse (bpm): 68 Weight (lbs): 170 Respiratory Rate (breaths/min): 16 Body Mass Index (BMI): 26.6 Blood Pressure (mmHg): 152/71 Capillary Blood Glucose (mg/dl): 229 Reference Range: 80 - 120 mg / dl Electronic Signature(s) Signed: 10/22/2021 11:25:39 AM By: [name] CHT EMT BS , , Entered By: [name] on 10/22/2021 11:25:39

## 2021-10-22 NOTE — Telephone Encounter (Signed)
Error

## 2021-10-22 NOTE — Progress Notes (Addendum)
Antonio [name], Antonio [name] ([id number]) Visit Report for 10/22/2021 HBO Details Patient Name: Date of Service: Antonio [name], Antonio [name] [name]. 10/22/2021 7:30 [name] Medical Record Number: [id number] Patient Account Number: [id number] Date of Birth/Sex: Treating RN: [birth year] (76 y.o. [name] Primary Care Daviona Herbert: [name] Other Clinician: [name] Referring Ajaya Crutchfield: Treating Ahamed Hofland/Extender: Krista Blue in Treatment: 10 HBO Treatment Course Details Treatment Course Number: 1 Ordering Renae Mottley: Bernerd Pho Treatments Ordered: otal 40 HBO Treatment Start Date: 08/23/2021 HBO Indication: Late Effect of Radiation HBO Treatment Details Treatment Number: 36 Patient Type: Outpatient Chamber Type: Monoplace Chamber Serial #: [id number] Treatment Protocol: 2.0 ATA with 90 minutes oxygen, and no air breaks Treatment Details Compression Rate Down: 2.0 psi / minute De-Compression Rate Up: 2.0 psi / minute Air breaks and breathing Decompress Decompress Compress Tx Pressure Begins Reached periods Begins Ends (leave unused spaces blank) Chamber Pressure (ATA 1 2 ------2 1 ) Clock Time (24 hr) 08:31 08:39 - - - - - - 10:09 10:18 Treatment Length: 107 (minutes) Treatment Segments: 4 Vital Signs Capillary Blood Glucose Reference Range: 80 - 120 mg / dl HBO Diabetic Blood Glucose Intervention Range: <131 mg/dl or >249 mg/dl Time Vitals Blood Respiratory Capillary Blood Glucose Pulse Action Type: Pulse: Temperature: Taken: Pressure: Rate: Glucose (mg/dl): Meter #: Oximetry (%) Taken: Pre 07:46 152/71 68 16 98.6 229 2 Post 10:21 132/68 64 16 98.7 191 2 Treatment Response Treatment Toleration: Well Treatment Completion Status: Treatment Completed without Adverse Event Physician HBO Attestation: I certify that I supervised this HBO treatment in accordance with Medicare guidelines. [name] trained emergency response team is readily available per  Yes hospital policies and procedures. Continue HBOT as ordered. Yes Electronic Signature(s) Signed: 10/22/2021 1:27:31 PM By: Kalman Shan DO Previous Signature: 10/22/2021 11:29:56 AM Version By: [name] CHT EMT BS , , Entered By: Kalman Shan on 10/22/2021 13:25:23 -------------------------------------------------------------------------------- HBO Safety Checklist Details Patient Name: Date of Service: Antonio [name]. 10/22/2021 7:30 [name] Medical Record Number: [id number] Patient Account Number: [id number] Date of Birth/Sex: Treating RN: 1946/07/08 (76 y.o. [name] Primary Care Emmajane Altamura: [name] Other Clinician: [name] Referring Emersyn Kotarski: Treating Krysta Bloomfield/Extender: Krista Blue in Treatment: 10 HBO Safety Checklist Items Safety Checklist Consent Form Signed Patient voided / foley secured and emptied When did you last eato 0645 Last dose of injectable or oral agent 0648 Ostomy pouch emptied and vented if applicable NA All implantable devices assessed, documented and approved NA Intravenous access site secured and place NA Valuables secured Linens and cotton and cotton/polyester blend (less than 51% polyester) Personal oil-based products / skin lotions / body lotions removed Wigs or hairpieces removed NA Smoking or tobacco materials removed NA Books / newspapers / magazines / loose paper removed Cologne, aftershave, perfume and deodorant removed Jewelry removed (may wrap wedding band) Make-up removed NA Hair care products removed Battery operated devices (external) removed Heating patches and chemical warmers removed Titanium eyewear removed NA Nail polish cured greater than 10 hours NA Casting material cured greater than 10 hours NA Hearing aids removed NA Loose dentures or partials removed dentures removed Prosthetics have been removed NA Patient demonstrates correct use of air break  device (if applicable) Patient concerns have been addressed Patient grounding bracelet on and cord attached to chamber Specifics for Inpatients (complete in addition to above) Medication sheet sent with patient NA Intravenous medications needed or due during therapy sent with patient NA Drainage tubes (e.g. nasogastric tube  or chest tube secured and vented) NA Endotracheal or Tracheotomy tube secured NA Cuff deflated of air and inflated with saline NA Airway suctioned NA Notes Paper version used prior to treatment. Electronic Signature(s) Signed: 10/22/2021 11:27:50 AM By: [name] CHT EMT BS , , Previous Signature: 10/22/2021 11:27:13 AM Version By: [name] CHT EMT BS , , Entered By: [name] on 10/22/2021 11:27:49

## 2021-10-22 NOTE — Progress Notes (Signed)
Antonio [name], Antonio [name] ([id number]) Visit Report for 10/22/2021 SuperBill Details Patient Name: Date of Service: COURT, [name] [place] 10/22/2021 Medical Record Number: [id number] Patient Account Number: [id number] Date of Birth/Sex: Treating RN: July 01, 1946 (76 y.o. 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

## 2021-10-23 ENCOUNTER — Encounter (HOSPITAL_BASED_OUTPATIENT_CLINIC_OR_DEPARTMENT_OTHER): Payer: Medicare Other | Admitting: Internal Medicine

## 2021-10-23 DIAGNOSIS — N3041 Irradiation cystitis with hematuria: Secondary | ICD-10-CM | POA: Diagnosis not present

## 2021-10-23 LAB — GLUCOSE, CAPILLARY
Glucose-Capillary: 147 mg/dL — ABNORMAL HIGH (ref 70–99)
Glucose-Capillary: 157 mg/dL — ABNORMAL HIGH (ref 70–99)

## 2021-10-23 NOTE — Progress Notes (Addendum)
DJON, TITH ([id number]) Visit Report for 10/23/2021 Arrival Information Details Patient Name: Date of Service: ASWAD, WANDREY [place] 10/23/2021 7:30 A M Medical Record Number: [id number] Patient Account Number: [id number] Date of Birth/Sex: Treating RN: 05/03/1946 (76 y.o. [name], [name] Primary Care Iyesha Such: [name] Other Clinician: [name] Referring Zaiya Annunziato: Treating Kian Ottaviano/Extender: [name] in Treatment: 10 Visit Information History Since Last Visit All ordered tests and consults were completed: Yes Patient Arrived: Ambulatory Added or deleted any medications: No Arrival Time: 07:30 Any new allergies or adverse reactions: No Accompanied By: None Had a fall or experienced change in No Transfer Assistance: None activities of daily living that may affect Patient Identification Verified: Yes risk of falls: Secondary Verification Process Completed: Yes Signs or symptoms of abuse/neglect since last visito No Patient Requires Transmission-Based Precautions: No Hospitalized since last visit: No Patient Has Alerts: No Implantable device outside of the clinic excluding No cellular tissue based products placed in the center since last visit: Pain Present Now: No Electronic Signature(s) Signed: 10/23/2021 1:19:18 PM By: [name] EMT Entered By: [name] on 10/23/2021 13:19:17 -------------------------------------------------------------------------------- Encounter Discharge Information Details Patient Name: Date of Service: [name] 10/23/2021 7:30 A M Medical Record Number: [id number] Patient Account Number: [id number] Date of Birth/Sex: Treating RN: Feb 04, 1946 (76 y.o. [name] Primary Care Jasraj Lappe: [name] Other Clinician: [name] Referring Arie Powell: Treating Delonna Ney/Extender: [name] in Treatment: 10 Encounter Discharge Information  Items Discharge Condition: Stable Ambulatory Status: Ambulatory Discharge Destination: Home Transportation: Private Auto Accompanied By: None Schedule Follow-up Appointment: Yes Clinical Summary of Care: Electronic Signature(s) Signed: 10/23/2021 1:32:31 PM By: [name] EMT Entered By: [name] on 10/23/2021 13:32:31 -------------------------------------------------------------------------------- Patient/Caregiver Education Details Patient Name: Date of Service: Ky Barban 1/10/2023andnbsp7:30 A M Medical Record Number: [id number] Patient Account Number: [id number] Date of Birth/Gender: Treating RN: [birth year] (76 y.o. [name] Primary Care Physician: [name] Other Clinician: [name] Referring Physician: Treating Physician/Extender: [name] in Treatment: 10 Education Assessment Education Provided To: Patient Education Topics Provided Electronic Signature(s) Signed: 10/23/2021 1:32:12 PM By: [name] EMT Entered By: [name] on 10/23/2021 13:32:12 -------------------------------------------------------------------------------- Vitals Details Patient Name: Date of Service: [name] 10/23/2021 7:30 A M Medical Record Number: [id number] Patient Account Number: [id number] Date of Birth/Sex: Treating RN: Feb 25, 1946 (76 y.o. [name] Primary Care Lakindra Wible: [name] Other Clinician: [name] Referring Tonatiuh Mallon: Treating Mylee Falin/Extender: [name] in Treatment: 10 Vital Signs Time Taken: 07:42 Temperature (F): 97.6 Height (in): 67 Pulse (bpm): 63 Weight (lbs): 170 Respiratory Rate (breaths/min): 16 Body Mass Index (BMI): 26.6 Blood Pressure (mmHg): 151/74 Capillary Blood Glucose (mg/dl): 147 Reference Range: 80 - 120 mg / dl Electronic Signature(s) Signed: 10/23/2021 1:22:47 PM By: [name] EMT Entered By: [name] on 10/23/2021 13:22:47

## 2021-10-23 NOTE — Progress Notes (Signed)
[name], [name] ([id number]) Visit Report for 10/23/2021 Physician Orders Details Patient Name: Date of Service: Antonio [name], Antonio [name] [name] 10/23/2021 7:30 A M Medical Record Number: [id number] Patient Account Number: [id number] Date of Birth/Sex: Treating RN: 04/23/1946 (76 y.o. 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 10/23/2021 7:30 A M Medical Record Number: [id number] Patient Account Number: [id number] Date of Birth/Sex: Treating RN: 1946/04/29 (76 y.o. 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 10/23/2021 Medical Record Number: [id number] Patient Account Number: [id number] Date of Birth/Sex: Treating RN: December 15, 1945 (76 y.o. 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

## 2021-10-23 NOTE — Progress Notes (Addendum)
MARCAS, BOWSHER ([id number]) Visit Report for 10/23/2021 HBO Details Patient Name: Date of Service: Antonio [name], Antonio [name] [name] 10/23/2021 7:30 A M Medical Record Number: [id number] Patient Account Number: [id number] Date of Birth/Sex: Treating RN: 04-06-1946 (76 y.o. Antonio [name] Primary Care Antonio [name]: Antonio [name] Other Clinician: [name] Referring Symphanie Cederberg: Treating Antonio [name]: [name] in Treatment: 10 HBO Treatment Course Details Treatment Course Number: 1 Ordering Phil Corti: Bernerd Pho Treatments Ordered: otal 40 HBO Treatment Start Date: 08/23/2021 HBO Indication: Late Effect of Radiation HBO Treatment Details Treatment Number: 37 Patient Type: Outpatient Chamber Type: Monoplace Chamber Serial #: [id number] Treatment Protocol: 2.0 ATA with 90 minutes oxygen, and no air breaks Treatment Details Compression Rate Down: 2.0 psi / minute De-Compression Rate Up: 2.0 psi / minute Air breaks and breathing Decompress Decompress Compress Tx Pressure Begins Reached periods Begins Ends (leave unused spaces blank) Chamber Pressure (ATA 1 2 ------2 1 ) Clock Time (24 hr) 07:59 08:07 - - - - - - 09:37 09:45 Treatment Length: 106 (minutes) Treatment Segments: 4 Vital Signs Capillary Blood Glucose Reference Range: 80 - 120 mg / dl HBO Diabetic Blood Glucose Intervention Range: <131 mg/dl or >249 mg/dl Time Vitals Blood Respiratory Capillary Blood Glucose Pulse Action Type: Pulse: Temperature: Taken: Pressure: Rate: Glucose (mg/dl): Meter #: Oximetry (%) Taken: Pre 07:42 151/74 63 16 97.6 147 Post 09:50 145/76 61 16 98.1 157 Treatment Response Treatment Toleration: Well Treatment Completion Status: Treatment Completed without Adverse Event Treatment Notes Paper version used prior to treatment. I certify that I directed operation of said chamber for today's treatment. [name] Courtany Mcmurphy Notes No concerns with treatment  given Physician HBO Attestation: I certify that I supervised this HBO treatment in accordance with Medicare guidelines. A trained emergency response team is readily available per Yes hospital policies and procedures. Continue HBOT as ordered. Yes Electronic Signature(s) Signed: 10/23/2021 4:28:08 PM By: [name] MD Previous Signature: 10/23/2021 3:49:36 PM Version By: [name] CHT EMT BS , , Previous Signature: 10/23/2021 1:29:12 PM Version By: [name] EMT Entered By: [name] on 10/23/2021 16:24:12 -------------------------------------------------------------------------------- HBO Safety Checklist Details Patient Name: Date of Service: Antonio Baltimore MA S A. 10/23/2021 7:30 A M Medical Record Number: [id number] Patient Account Number: [id number] Date of Birth/Sex: Treating RN: 05-Apr-1946 (76 y.o. Antonio [name] Primary Care Rihaan Barrack: Antonio [name] Other Clinician: [name] Referring Avenly Roberge: Treating Xzaviar Maloof/Extender: [name] in Treatment: 10 HBO Safety Checklist Items Safety Checklist Consent Form Signed Patient voided / foley secured and emptied When did you last eato 0630 Last dose of injectable or oral agent 0632 Ostomy pouch emptied and vented if applicable NA All implantable devices assessed, documented and approved NA Intravenous access site secured and place NA Valuables secured Linens and cotton and cotton/polyester blend (less than 51% polyester) Personal oil-based products / skin lotions / body lotions removed Wigs or hairpieces removed NA Smoking or tobacco materials removed Books / newspapers / magazines / loose paper removed Cologne, aftershave, perfume and deodorant removed Jewelry removed (may wrap wedding band) NA Make-up removed NA Hair care products removed NA Battery operated devices (external) removed Heating patches and chemical warmers removed Titanium eyewear  removed NA Nail polish cured greater than 10 hours NA Casting material cured greater than 10 hours NA Hearing aids removed NA Loose dentures or partials removed NA Prosthetics have been removed NA Patient demonstrates correct use of air break device (if applicable) Patient concerns have been addressed Patient grounding bracelet  on and cord attached to chamber Specifics for Inpatients (complete in addition to above) Medication sheet sent with patient NA Intravenous medications needed or due during therapy sent with patient NA Drainage tubes (e.g. nasogastric tube or chest tube secured and vented) NA Endotracheal or Tracheotomy tube secured NA Cuff deflated of air and inflated with saline NA Airway suctioned NA Notes Paper version used prior to treatment. I certify that I directed performance of the safety check today. [name] Electronic Signature(s) Signed: 10/23/2021 3:49:06 PM By: [name] CHT EMT BS , , Previous Signature: 10/23/2021 1:24:05 PM Version By: [name] EMT Entered By: [name] on 10/23/2021 15:49:06

## 2021-10-24 ENCOUNTER — Encounter (HOSPITAL_BASED_OUTPATIENT_CLINIC_OR_DEPARTMENT_OTHER): Payer: Medicare Other | Admitting: Physician Assistant

## 2021-10-24 ENCOUNTER — Other Ambulatory Visit: Payer: Self-pay

## 2021-10-24 DIAGNOSIS — N3041 Irradiation cystitis with hematuria: Secondary | ICD-10-CM | POA: Diagnosis not present

## 2021-10-24 LAB — GLUCOSE, CAPILLARY
Glucose-Capillary: 213 mg/dL — ABNORMAL HIGH (ref 70–99)
Glucose-Capillary: 194 mg/dL — ABNORMAL HIGH (ref 70–99)

## 2021-10-24 NOTE — Progress Notes (Signed)
Antonio [name], Antonio [name] ([id number]) Visit Report for 10/24/2021 SuperBill Details Patient Name: Date of Service: Antonio [name], Antonio [name] [place] 10/24/2021 Medical Record Number: [id number] Patient Account Number: [id number] Date of Birth/Sex: Treating RN: [birth year] (76 y.o. 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

## 2021-10-24 NOTE — Progress Notes (Addendum)
Antonio [name], Antonio [name] ([id number]) Visit Report for 10/24/2021 HBO Details Patient Name: Date of Service: TAHJI, Antonio [name] A. 10/24/2021 7:30 A M Medical Record Number: [id number] Patient Account Number: [id number] Date of Birth/Sex: Treating RN: August 05, 1946 (76 y.o. [name], [name] Primary Care Clarice Zulauf: [name] Other Clinician: [name] Referring Jun Osment: Treating Maisie Hauser/Extender: [name] in Treatment: 10 HBO Treatment Course Details Treatment Course Number: 1 Ordering [place] Nick: Bernerd Pho Treatments Ordered: otal 40 HBO Treatment Start Date: 08/23/2021 HBO Indication: Late Effect of Radiation HBO Treatment Details Treatment Number: 38 Patient Type: Outpatient Chamber Type: Monoplace Chamber Serial #: [id number] Treatment Protocol: 2.0 ATA with 90 minutes oxygen, and no air breaks Treatment Details Compression Rate Down: 2.0 psi / minute De-Compression Rate Up: 2.0 psi / minute Air breaks and breathing Decompress Decompress Compress Tx Pressure Begins Reached periods Begins Ends (leave unused spaces blank) Chamber Pressure (ATA 1 2 ------2 1 ) Clock Time (24 hr) 08:05 08:13 - - - - - - 09:43 09:55 Treatment Length: 110 (minutes) Treatment Segments: 4 Vital Signs Capillary Blood Glucose Reference Range: 80 - 120 mg / dl HBO Diabetic Blood Glucose Intervention Range: <131 mg/dl or >249 mg/dl Time Vitals Blood Respiratory Capillary Blood Glucose Pulse Action Type: Pulse: Temperature: Taken: Pressure: Rate: Glucose (mg/dl): Meter #: Oximetry (%) Taken: Pre 07:53 100/64 61 18 98.7 213 Post 09:57 108/73 57 18 98.2 194 Treatment Response Treatment Toleration: Well Treatment Completion Status: Treatment Completed without Adverse Event Electronic Signature(s) Signed: 10/24/2021 10:33:22 AM By: [name] CHT EMT BS , , Signed: 10/24/2021 5:05:22 PM By: [name] Entered By: [name]  on 10/24/2021 10:33:21 -------------------------------------------------------------------------------- HBO Safety Checklist Details Patient Name: Date of Service: [name] 10/24/2021 7:30 A M Medical Record Number: [id number] Patient Account Number: [id number] Date of Birth/Sex: Treating RN: October 22, 1945 (76 y.o. [name], [name] Primary Care Kelleigh Skerritt: [name] Other Clinician: [name] Referring Lauralie Blacksher: Treating [name]/Extender: [name] in Treatment: 10 HBO Safety Checklist Items Safety Checklist Consent Form Signed Patient voided / foley secured and emptied When did you last eato 0630 Last dose of injectable or oral agent 0645 Ostomy pouch emptied and vented if applicable NA All implantable devices assessed, documented and approved NA Intravenous access site secured and place NA Valuables secured Linens and cotton and cotton/polyester blend (less than 51% polyester) Personal oil-based products / skin lotions / body lotions removed Wigs or hairpieces removed NA Smoking or tobacco materials removed NA Books / newspapers / magazines / loose paper removed Cologne, aftershave, perfume and deodorant removed Jewelry removed (may wrap wedding band) Make-up removed NA Hair care products removed Battery operated devices (external) removed Heating patches and chemical warmers removed Titanium eyewear removed NA Nail polish cured greater than 10 hours NA Casting material cured greater than 10 hours NA Hearing aids removed NA Loose dentures or partials removed dentures removed Prosthetics have been removed NA Patient demonstrates correct use of air break device (if applicable) Patient concerns have been addressed Patient grounding bracelet on and cord attached to chamber Specifics for Inpatients (complete in addition to above) Medication sheet sent with patient NA Intravenous medications needed or due  during therapy sent with patient NA Drainage tubes (e.g. nasogastric tube or chest tube secured and vented) NA Endotracheal or Tracheotomy tube secured NA Cuff deflated of air and inflated with saline NA Airway suctioned NA Notes Paper version used prior to treatment. Electronic Signature(s) Signed: 10/24/2021  8:18:41 AM By: [name] CHT EMT BS , , Previous Signature: 10/24/2021 8:16:23 AM Version By: [name] CHT EMT BS , , Entered By: [name] on 10/24/2021 08:18:40

## 2021-10-24 NOTE — Progress Notes (Addendum)
[name], [name] ([id number]) Visit Report for 10/24/2021 Arrival Information Details Patient Name: Date of Service: KYAL, ARTS [name] 10/24/2021 7:30 A M Medical Record Number: [id number] Patient Account Number: [id number] Date of Birth/Sex: Treating RN: December 17, 1945 (76 y.o. Antonio [name], [name] Primary Care Joenathan Sakuma: [name] Other Clinician: [name] Referring Allani Reber: Treating Brissia Delisa/Extender: [name] in Treatment: 10 Visit Information History Since Last Visit All ordered tests and consults were completed: Yes Patient Arrived: Ambulatory Added or deleted any medications: No Arrival Time: 07:36 Any new allergies or adverse reactions: No Accompanied By: self Had a fall or experienced change in No Transfer Assistance: None activities of daily living that may affect Patient Identification Verified: Yes risk of falls: Secondary Verification Process Completed: Yes Signs or symptoms of abuse/neglect since last visito No Patient Requires Transmission-Based Precautions: No Hospitalized since last visit: No Patient Has Alerts: No Implantable device outside of the clinic excluding No cellular tissue based products placed in the center since last visit: Pain Present Now: No Electronic Signature(s) Signed: 10/24/2021 8:12:07 AM By: [name] CHT EMT BS , , Entered By: [name] on 10/24/2021 08:12:06 -------------------------------------------------------------------------------- Encounter Discharge Information Details Patient Name: Date of Service: Antonio Baltimore MA S A. 10/24/2021 7:30 A M Medical Record Number: [id number] Patient Account Number: [id number] Date of Birth/Sex: Treating RN: [birth year] (76 y.o. Antonio [name], [name] Primary Care Imogen Maddalena: [name] Other Clinician: [name] Referring Lenville Hibberd: Treating Haisley Arens/Extender: [name] in Treatment: 10 Encounter  Discharge Information Items Discharge Condition: Stable Ambulatory Status: Ambulatory Discharge Destination: Home Transportation: Private Auto Accompanied By: self Schedule Follow-up Appointment: No Clinical Summary of Care: Electronic Signature(s) Signed: 10/24/2021 10:35:16 AM By: [name] CHT EMT BS , , Entered By: [name] on 10/24/2021 10:35:16 -------------------------------------------------------------------------------- Vitals Details Patient Name: Date of Service: Antonio Baltimore MA S A. 10/24/2021 7:30 A M Medical Record Number: [id number] Patient Account Number: [id number] Date of Birth/Sex: Treating RN: 1946-01-07 (76 y.o. Antonio [name], [name] Primary Care Jasper Hanf: [name] Other Clinician: [name] Referring Jaqualyn Juday: Treating Yianna Tersigni/Extender: [name] in Treatment: 10 Vital Signs Time Taken: 07:53 Temperature (F): 98.7 Height (in): 67 Pulse (bpm): 61 Weight (lbs): 170 Respiratory Rate (breaths/min): 18 Body Mass Index (BMI): 26.6 Blood Pressure (mmHg): 100/64 Capillary Blood Glucose (mg/dl): 213 Reference Range: 80 - 120 mg / dl Electronic Signature(s) Signed: 10/24/2021 8:12:38 AM By: [name] CHT EMT BS , , Entered By: [name] on 10/24/2021 08:12:38

## 2021-10-25 ENCOUNTER — Encounter (HOSPITAL_BASED_OUTPATIENT_CLINIC_OR_DEPARTMENT_OTHER): Payer: Medicare Other | Admitting: Internal Medicine

## 2021-10-25 DIAGNOSIS — N3041 Irradiation cystitis with hematuria: Secondary | ICD-10-CM | POA: Diagnosis not present

## 2021-10-25 LAB — GLUCOSE, CAPILLARY
Glucose-Capillary: 190 mg/dL — ABNORMAL HIGH (ref 70–99)
Glucose-Capillary: 185 mg/dL — ABNORMAL HIGH (ref 70–99)

## 2021-10-25 NOTE — Progress Notes (Addendum)
JOVANY, DISANO ([id number]) Visit Report for 10/25/2021 Arrival Information Details Patient Name: Date of Service: VADIM, CENTOLA [name] 10/25/2021 7:30 A M Medical Record Number: [id number] Patient Account Number: [id number] Date of Birth/Sex: Treating RN: 1946/05/14 (76 y.o. [name] Primary Care Chaye Misch: [name] Other Clinician: [name] Referring Julieanna Geraci: Treating Lovada Barwick/Extender: [name] in Treatment: 11 Visit Information History Since Last Visit All ordered tests and consults were completed: Yes Patient Arrived: Ambulatory Added or deleted any medications: No Arrival Time: 07:43 Any new allergies or adverse reactions: No Accompanied By: self Had a fall or experienced change in No Transfer Assistance: None activities of daily living that may affect Patient Identification Verified: Yes risk of falls: Secondary Verification Process Completed: Yes Signs or symptoms of abuse/neglect since last visito No Patient Requires Transmission-Based Precautions: No Hospitalized since last visit: No Patient Has Alerts: No Implantable device outside of the clinic excluding No cellular tissue based products placed in the center since last visit: Pain Present Now: No Electronic Signature(s) Signed: 10/26/2021 8:34:43 AM By: [name] CHT EMT BS , , Previous Signature: 10/25/2021 8:32:30 AM Version By: [name] CHT EMT BS , , Entered By: [name] on 10/25/2021 11:16:41 -------------------------------------------------------------------------------- Encounter Discharge Information Details Patient Name: Date of Service: [name] 10/25/2021 7:30 A M Medical Record Number: [id number] Patient Account Number: [id number] Date of Birth/Sex: Treating RN: Nov 11, 1945 (76 y.o. [name] Primary Care Trysta Showman: [name] Other Clinician: [name] Referring Linwood Gullikson: Treating  Armanii Urbanik/Extender: [name] in Treatment: 11 Encounter Discharge Information Items Discharge Condition: Stable Ambulatory Status: Ambulatory Discharge Destination: Home Transportation: Private Auto Accompanied By: self Schedule Follow-up Appointment: No Clinical Summary of Care: Electronic Signature(s) Signed: 10/26/2021 8:34:43 AM By: [name] CHT EMT BS , , Previous Signature: 10/25/2021 11:07:43 AM Version By: [name] CHT EMT BS , , Entered By: [name] on 10/25/2021 11:17:48 -------------------------------------------------------------------------------- Vitals Details Patient Name: Date of Service: [name] 10/25/2021 7:30 A M Medical Record Number: [id number] Patient Account Number: [id number] Date of Birth/Sex: Treating RN: September 04, 1946 (76 y.o. [name] Primary Care Indie Boehne: [name] Other Clinician: [name] Referring Natina Wiginton: Treating Joanthony Hamza/Extender: [name] in Treatment: 11 Vital Signs Time Taken: 07:43 Temperature (F): 98.3 Height (in): 67 Pulse (bpm): 60 Weight (lbs): 170 Respiratory Rate (breaths/min): 18 Body Mass Index (BMI): 26.6 Blood Pressure (mmHg): 104/63 Capillary Blood Glucose (mg/dl): 190 Reference Range: 80 - 120 mg / dl Electronic Signature(s) Signed: 10/26/2021 8:34:43 AM By: [name] CHT EMT BS , , Previous Signature: 10/25/2021 8:33:02 AM Version By: [name] CHT EMT BS , , Entered By: [name] on 10/25/2021 11:16:54

## 2021-10-25 NOTE — Progress Notes (Addendum)
Antonio [name], Antonio [name] ([id number]) Visit Report for 10/25/2021 HBO Details Patient Name: Date of Service: Antonio [name], Antonio [name] [name] 10/25/2021 7:30 A M Medical Record Number: [id number] Patient Account Number: [id number] Date of Birth/Sex: Treating RN: 01/30/1946 (76 y.o. [name] Primary Care Akiva Josey: [name] Other Clinician: [name] Referring Julienne Vogler: Treating Lurae Hornbrook/Extender: [name] in Treatment: 11 HBO Treatment Course Details Treatment Course Number: 1 Ordering Telly Broberg: [name] T Treatments Ordered: otal 40 HBO Treatment Start Date: 08/23/2021 HBO Indication: Late Effect of Radiation HBO Treatment Details Treatment Number: 39 Patient Type: Outpatient Chamber Type: Monoplace Chamber Serial #: [id number] Treatment Protocol: 2.0 ATA with 90 minutes oxygen, and no air breaks Treatment Details Compression Rate Down: 2.0 psi / minute De-Compression Rate Up: 2.0 psi / minute Air breaks and breathing Decompress Decompress Compress Tx Pressure Begins Reached periods Begins Ends (leave unused spaces blank) Chamber Pressure (ATA 1 2 ------2 1 ) Clock Time (24 hr) 08:01 08:09 - - - - - - 09:39 09:47 Treatment Length: 106 (minutes) Treatment Segments: 4 Vital Signs Capillary Blood Glucose Reference Range: 80 - 120 mg / dl HBO Diabetic Blood Glucose Intervention Range: <131 mg/dl or >249 mg/dl Time Vitals Blood Respiratory Capillary Blood Glucose Pulse Action Type: Pulse: Temperature: Taken: Pressure: Rate: Glucose (mg/dl): Meter #: Oximetry (%) Taken: Pre 07:43 104/63 60 18 98.3 190 Post 09:51 107/68 58 18 98.3 185 Treatment Response Treatment Toleration: Well Treatment Completion Status: Treatment Completed without Adverse Event Annebelle Bostic Notes No concerns with treatment given Physician HBO Attestation: I certify that I supervised this HBO treatment in accordance with Medicare guidelines. A trained  emergency response team is readily available per Yes hospital policies and procedures. Continue HBOT as ordered. Yes Electronic Signature(s) Signed: 10/25/2021 4:29:39 PM By: [name] MD Previous Signature: 10/25/2021 11:01:15 AM Version By: [name] CHT EMT BS , , Entered By: [name] on 10/25/2021 16:25:14 -------------------------------------------------------------------------------- HBO Safety Checklist Details Patient Name: Date of Service: Antonio [place] MA S A. 10/25/2021 7:30 A M Medical Record Number: [id number] Patient Account Number: [id number] Date of Birth/Sex: Treating RN: [birth year] (76 y.o. [name] Primary Care Oakley Orban: [name] Other Clinician: [name] Referring Chi Woodham: Treating Joscelyne Renville/Extender: [name] in Treatment: 11 HBO Safety Checklist Items Safety Checklist Consent Form Signed Patient voided / foley secured and emptied When did you last eato 0630 Last dose of injectable or oral agent 0645 Ostomy pouch emptied and vented if applicable NA All implantable devices assessed, documented and approved NA Intravenous access site secured and place NA Valuables secured Linens and cotton and cotton/polyester blend (less than 51% polyester) Personal oil-based products / skin lotions / body lotions removed Wigs or hairpieces removed NA Smoking or tobacco materials removed NA Books / newspapers / magazines / loose paper removed Cologne, aftershave, perfume and deodorant removed Jewelry removed (may wrap wedding band) Make-up removed NA Hair care products removed Battery operated devices (external) removed Heating patches and chemical warmers removed Titanium eyewear removed NA Nail polish cured greater than 10 hours NA Casting material cured greater than 10 hours NA Hearing aids removed NA Loose dentures or partials removed dentures removed Prosthetics have been  removed NA Patient demonstrates correct use of air break device (if applicable) Patient concerns have been addressed Patient grounding bracelet on and cord attached to chamber Specifics for Inpatients (complete in addition to above) Medication sheet sent with patient NA Intravenous medications needed or due during therapy sent with patient NA  Drainage tubes (e.g. nasogastric tube or chest tube secured and vented) NA Endotracheal or Tracheotomy tube secured NA Cuff deflated of air and inflated with saline NA Airway suctioned NA Notes Paper version used prior to treatment. Electronic Signature(s) Signed: 10/26/2021 8:34:43 AM By: [name] CHT EMT BS , , Previous Signature: 10/25/2021 8:35:12 AM Version By: [name] CHT EMT BS , , Entered By: [name] on 10/25/2021 11:17:05

## 2021-10-26 ENCOUNTER — Other Ambulatory Visit: Payer: Self-pay

## 2021-10-26 ENCOUNTER — Encounter (HOSPITAL_BASED_OUTPATIENT_CLINIC_OR_DEPARTMENT_OTHER): Payer: Medicare Other | Admitting: Internal Medicine

## 2021-10-26 DIAGNOSIS — R350 Frequency of micturition: Secondary | ICD-10-CM

## 2021-10-26 DIAGNOSIS — Z8546 Personal history of malignant neoplasm of prostate: Secondary | ICD-10-CM | POA: Diagnosis not present

## 2021-10-26 DIAGNOSIS — N3041 Irradiation cystitis with hematuria: Secondary | ICD-10-CM | POA: Diagnosis not present

## 2021-10-26 LAB — GLUCOSE, CAPILLARY
Glucose-Capillary: 206 mg/dL — ABNORMAL HIGH (ref 70–99)
Glucose-Capillary: 164 mg/dL — ABNORMAL HIGH (ref 70–99)

## 2021-10-26 NOTE — Progress Notes (Signed)
Antonio [name], Antonio [name] ([id number]) Visit Report for 10/25/2021 SuperBill Details Patient Name: Date of Service: Antonio [name], Antonio [name] [place] 10/25/2021 Medical Record Number: [id number] Patient Account Number: [id number] Date of Birth/Sex: Treating RN: September 18, 1946 (76 y.o. 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 Personal history of malignant neoplasm of prostate R35.0 Frequency of micturition Physician Procedures Quantity CPT4 Code Description Modifier [id number] - WC PHYS HYPERBARIC OXYGEN THERAPY 1 ICD-10 Diagnosis Description 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

## 2021-10-26 NOTE — Progress Notes (Addendum)
Antonio [name], Antonio [name] ([id number]) Visit Report for 10/26/2021 HBO Details Patient Name: Date of Service: Antonio [name], Antonio [name] [name]. 10/26/2021 7:30 [name] Medical Record Number: [id number] Patient Account Number: [id number] Date of Birth/Sex: Treating RN: Apr 15, 1946 (76 y.o. Antonio [name], Antonio [name] Primary Care Antonio [name]: Antonio [name] Other Clinician: Donavan [name] Referring Antonio [name]: Treating Antonio [name]/Extender: Antonio [name] in Treatment: 11 HBO Treatment Course Details Treatment Course Number: 1 Ordering Antonio [name]: Antonio [name] T Treatments Ordered: otal 40 HBO Treatment 08/23/2021 Start Date: HBO Indication: Late Effect of Radiation HBO Treatment 10/26/2021 End Date: HBO Discharge Treatment Series Complete; Non-Wound Protocol Outcome: Completed with Symptom Relief HBO Treatment Details Treatment Number: 40 Patient Type: Outpatient Chamber Type: Monoplace Chamber Serial #: [id number] Treatment Protocol: 2.0 ATA with 90 minutes oxygen, and no air breaks Treatment Details Compression Rate Down: 2.0 psi / minute De-Compression Rate Up: 2.0 psi / minute Air breaks and breathing Decompress Decompress Compress Tx Pressure Begins Reached periods Begins Ends (leave unused spaces blank) Chamber Pressure (ATA 1 2 ------2 1 ) Clock Time (24 hr) 08:19 08:27 - - - - - - 09:57 10:05 Treatment Length: 106 (minutes) Treatment Segments: 4 Vital Signs Capillary Blood Glucose Reference Range: 80 - 120 mg / dl HBO Diabetic Blood Glucose Intervention Range: <131 mg/dl or >249 mg/dl Time Vitals Blood Respiratory Capillary Blood Glucose Pulse Action Type: Pulse: Temperature: Taken: Pressure: Rate: Glucose (mg/dl): Meter #: Oximetry (%) Taken: Pre 07:54 141/77 65 16 98.6 206 2 Post 10:09 149/84 62 16 98.4 164 2 Treatment Response Treatment Toleration: Well Treatment Completion Status: Treatment Completed without Adverse Event Treatment Notes Patient states that  symptoms of hematuria have improved and has not seen any blood in urine for [name]. Patient states that frequency of micturition has improved although he experiences some urgency at times. Patient will follow up with referring Urologist. Physician HBO Attestation: I certify that I supervised this HBO treatment in accordance with Medicare guidelines. [name] trained emergency response team is readily available per Yes hospital policies and procedures. Continue HBOT as ordered. Yes Electronic Signature(s) Signed: 10/26/2021 12:30:10 PM By: Antonio Shan DO Entered By: Antonio [name] on 10/26/2021 12:29:02 -------------------------------------------------------------------------------- HBO Safety Checklist Details Patient Name: Date of Service: Antonio [name]. 10/26/2021 7:30 [name] Medical Record Number: [id number] Patient Account Number: [id number] Date of Birth/Sex: Treating RN: 1946/08/09 (76 y.o. Antonio [name], Antonio [name] Primary Care Antonio [name]: Antonio [name] Other Clinician: Donavan [name] Referring Antonio [name]: Treating Antonio [name]/Extender: Antonio [name] in Treatment: 11 HBO Safety Checklist Items Safety Checklist Consent Form Signed Patient voided / foley secured and emptied When did you last eato 0630 Last dose of injectable or oral agent 0630 Ostomy pouch emptied and vented if applicable NA All implantable devices assessed, documented and approved NA Intravenous access site secured and place NA Valuables secured Linens and cotton and cotton/polyester blend (less than 51% polyester) Personal oil-based products / skin lotions / body lotions removed Wigs or hairpieces removed NA Smoking or tobacco materials removed NA Books / newspapers / magazines / loose paper removed Cologne, aftershave, perfume and deodorant removed Jewelry removed (may wrap wedding band) Make-up removed NA Hair care products removed NA Battery operated devices (external)  removed Heating patches and chemical warmers removed Titanium eyewear removed NA Nail polish cured greater than 10 hours NA Casting material cured greater than 10 hours NA Hearing aids removed NA Loose dentures or partials removed NA Prosthetics have been removed NA Patient demonstrates correct use of air break  device (if applicable) Patient concerns have been addressed Patient grounding bracelet on and cord attached to chamber Specifics for Inpatients (complete in addition to above) Medication sheet sent with patient NA Intravenous medications needed or due during therapy sent with patient NA Drainage tubes (e.g. nasogastric tube or chest tube secured and vented) NA Endotracheal or Tracheotomy tube secured NA Cuff deflated of air and inflated with saline NA Airway suctioned NA Notes Paper version used prior to treatment. Electronic Signature(s) Signed: 10/26/2021 8:38:38 AM By: Antonio [name] CHT EMT BS , , Entered By: Antonio [name] on 10/26/2021 08:38:37

## 2021-10-26 NOTE — Progress Notes (Addendum)
Antonio [name], Antonio [name] ([id number]) Visit Report for 10/26/2021 Arrival Information Details Patient Name: Date of Service: Antonio [name], Antonio [name] [name] 10/26/2021 7:30 A M Medical Record Number: [id number] Patient Account Number: [id number] Date of Birth/Sex: Treating RN: 01-Jan-1946 (76 y.o. [name], [name] Primary Care Avionna Bower: [name] Other Clinician: [name] Referring Dequavion Follette: Treating Idalys Konecny/Extender: Krista Blue in Treatment: 11 Visit Information History Since Last Visit All ordered tests and consults were completed: Yes Patient Arrived: Ambulatory Added or deleted any medications: No Arrival Time: 07:34 Any new allergies or adverse reactions: No Accompanied By: self Had a fall or experienced change in No Transfer Assistance: None activities of daily living that may affect Patient Identification Verified: Yes risk of falls: Secondary Verification Process Completed: Yes Signs or symptoms of abuse/neglect since last visito No Patient Requires Transmission-Based Precautions: No Hospitalized since last visit: No Patient Has Alerts: No Implantable device outside of the clinic excluding No cellular tissue based products placed in the center since last visit: Pain Present Now: No Electronic Signature(s) Signed: 10/26/2021 8:34:03 AM By: [name] CHT EMT BS , , Entered By: [name] on 10/26/2021 08:34:03 -------------------------------------------------------------------------------- Vitals Details Patient Name: Date of Service: Antonio Baltimore MA S A. 10/26/2021 7:30 A M Medical Record Number: [id number] Patient Account Number: [id number] Date of Birth/Sex: Treating RN: June 26, 1946 (76 y.o. [name], [name] Primary Care Kimberlly Norgard: [name] Other Clinician: [name] Referring Shey Bartmess: Treating Mannat Benedetti/Extender: Krista Blue in Treatment: 11 Vital Signs Time Taken:  07:54 Temperature (F): 98.6 Height (in): 67 Pulse (bpm): 65 Weight (lbs): 170 Respiratory Rate (breaths/min): 16 Body Mass Index (BMI): 26.6 Blood Pressure (mmHg): 141/77 Capillary Blood Glucose (mg/dl): 206 Reference Range: 80 - 120 mg / dl Electronic Signature(s) Signed: 10/26/2021 8:36:24 AM By: [name] CHT EMT BS , , Entered By: [name] on 10/26/2021 86:38:17

## 2021-10-26 NOTE — Progress Notes (Signed)
[name], [name] ([id number]) Visit Report for 10/26/2021 SuperBill Details Patient Name: Date of Service: Antonio [name], Antonio [name] [place] 10/26/2021 Medical Record Number: [id number] Patient Account Number: [id number] Date of Birth/Sex: Treating RN: Apr 18, 1946 (76 y.o. 

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
## 2021-12-07 ENCOUNTER — Ambulatory Visit: Payer: Commercial Managed Care - HMO | Admitting: General Practice

## 2021-12-12 ENCOUNTER — Ambulatory Visit (HOSPITAL_BASED_OUTPATIENT_CLINIC_OR_DEPARTMENT_OTHER): Payer: Self-pay | Admitting: General Practice
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
## 2021-12-25 ENCOUNTER — Encounter (HOSPITAL_BASED_OUTPATIENT_CLINIC_OR_DEPARTMENT_OTHER): Payer: Self-pay | Admitting: General Practice

## 2021-12-25 ENCOUNTER — Ambulatory Visit (INDEPENDENT_AMBULATORY_CARE_PROVIDER_SITE_OTHER): Payer: Medicare Other | Admitting: General Practice

## 2021-12-25 ENCOUNTER — Other Ambulatory Visit: Payer: Self-pay

## 2021-12-25 VITALS — BP 120/82 | HR 63 | Ht 67.5 in | Wt 177.5 lb

## 2021-12-25 DIAGNOSIS — E785 Hyperlipidemia, unspecified: Secondary | ICD-10-CM

## 2021-12-25 DIAGNOSIS — R079 Chest pain, unspecified: Secondary | ICD-10-CM

## 2021-12-25 DIAGNOSIS — Z794 Long term (current) use of insulin: Secondary | ICD-10-CM

## 2021-12-25 DIAGNOSIS — I10 Essential (primary) hypertension: Secondary | ICD-10-CM | POA: Diagnosis not present

## 2021-12-25 DIAGNOSIS — I25700 Atherosclerosis of coronary artery bypass graft(s), unspecified, with unstable angina pectoris: Secondary | ICD-10-CM

## 2021-12-25 DIAGNOSIS — R319 Hematuria, unspecified: Secondary | ICD-10-CM

## 2021-12-25 DIAGNOSIS — E119 Type 2 diabetes mellitus without complications: Secondary | ICD-10-CM

## 2021-12-25 NOTE — Patient Instructions (Signed)
Medication Instructions:  ?Your Physician recommend you continue on your current medication as directed.   ? ?*If you need a refill on your cardiac medications before your next appointment, please call your pharmacy* ? ?Follow-Up: ?At [facility], you and your health needs are our priority.  As part of our continuing mission to provide you with exceptional heart care, we have created designated Provider Care Teams.  These Care Teams include your primary Cardiologist (physician) and Advanced Practice Providers (APPs -  Physician Assistants and Nurse Practitioners) who all work together to provide you with the care you need, when you need it. ? ?We recommend signing up for the patient portal called "MyChart".  Sign up information is provided on this After Visit Summary.  MyChart is used to connect with patients for Virtual Visits (Telemedicine).  Patients are able to view lab/test results, encounter notes, upcoming appointments, etc.  Non-urgent messages can be sent to your provider as well.   ?To learn more about what you can do with MyChart, go to [email or web address].   ? ?Your next appointment:   ?6 month(s) ? ?The format for your next appointment:   ?In Person ? ?Provider:   ?Peter Martinique, MD { ?

## 2022-01-11 ENCOUNTER — Other Ambulatory Visit: Payer: Self-pay | Admitting: Cardiology

## 2022-01-30 ENCOUNTER — Other Ambulatory Visit: Payer: Self-pay | Admitting: Cardiology

## 2022-02-28 ENCOUNTER — Other Ambulatory Visit: Payer: Self-pay | Admitting: Cardiology

## 2022-03-20 ENCOUNTER — Telehealth: Payer: Self-pay | Admitting: Cardiology

## 2022-03-20 NOTE — Telephone Encounter (Signed)
Received call from patient c/o active chest pain.  Reports intermittent chest pain x 2 weeks.   Occurring daily multiple times.   Can happen with rest or exertion.   Reports mild SOB, no N/V/D.  Reports pain currently 4/10, just took 1 NTG.   BP this AM 156/87. NTG improving pain.  No distress noted on phone.  Advised to proceed to ER or call EMS.  Patient hesitant and states he will be waiting for "18 hours".  Advised we are unable to treat acutely in the office and would encourage to proceed to ER for evaluation. Patient aware to have someone drive him.   Hx: CAD s/p CABG with PCI to SVG-diag and SVG-PDA, HTN, HLD, DM2.

## 2022-03-20 NOTE — Telephone Encounter (Signed)
Pt c/o of Chest Pain: STAT if CP now or developed within 24 hours  1. Are you having CP right now? yes  2. Are you experiencing any other symptoms (ex. SOB, nausea, vomiting, sweating)? Having a headache every afternoon.  Has headache right now  3. How long have you been experiencing CP? A couple of weeks.  4. Is your CP continuous or coming and going? Comes and goes.   5. Have you taken Nitroglycerin? No  ?

## 2022-03-23 ENCOUNTER — Other Ambulatory Visit: Payer: Self-pay

## 2022-03-23 ENCOUNTER — Encounter (HOSPITAL_COMMUNITY): Payer: Self-pay | Admitting: Cardiology

## 2022-03-23 ENCOUNTER — Emergency Department (HOSPITAL_COMMUNITY): Payer: Medicare Other

## 2022-03-23 ENCOUNTER — Observation Stay (HOSPITAL_COMMUNITY)
Admission: EM | Admit: 2022-03-23 | Discharge: 2022-03-24 | Disposition: A | Discharge status: Home / Self Care | Payer: Medicare Other | Attending: Family Medicine | Admitting: Family Medicine

## 2022-03-23 DIAGNOSIS — I10 Essential (primary) hypertension: Secondary | ICD-10-CM | POA: Diagnosis not present

## 2022-03-23 DIAGNOSIS — R0789 Other chest pain: Secondary | ICD-10-CM | POA: Diagnosis present

## 2022-03-23 DIAGNOSIS — E1169 Type 2 diabetes mellitus with other specified complication: Secondary | ICD-10-CM | POA: Insufficient documentation

## 2022-03-23 DIAGNOSIS — Z794 Long term (current) use of insulin: Secondary | ICD-10-CM | POA: Diagnosis not present

## 2022-03-23 DIAGNOSIS — Z87891 Personal history of nicotine dependence: Secondary | ICD-10-CM | POA: Insufficient documentation

## 2022-03-23 DIAGNOSIS — D696 Thrombocytopenia, unspecified: Secondary | ICD-10-CM | POA: Diagnosis not present

## 2022-03-23 DIAGNOSIS — I160 Hypertensive urgency: Secondary | ICD-10-CM | POA: Diagnosis not present

## 2022-03-23 DIAGNOSIS — I25110 Atherosclerotic heart disease of native coronary artery with unstable angina pectoris: Secondary | ICD-10-CM | POA: Diagnosis not present

## 2022-03-23 DIAGNOSIS — E785 Hyperlipidemia, unspecified: Secondary | ICD-10-CM | POA: Insufficient documentation

## 2022-03-23 DIAGNOSIS — I200 Unstable angina: Secondary | ICD-10-CM

## 2022-03-23 DIAGNOSIS — Z8546 Personal history of malignant neoplasm of prostate: Secondary | ICD-10-CM | POA: Insufficient documentation

## 2022-03-23 DIAGNOSIS — Z960 Presence of urogenital implants: Secondary | ICD-10-CM | POA: Diagnosis not present

## 2022-03-23 DIAGNOSIS — Z7984 Long term (current) use of oral hypoglycemic drugs: Secondary | ICD-10-CM | POA: Insufficient documentation

## 2022-03-23 DIAGNOSIS — I2510 Atherosclerotic heart disease of native coronary artery without angina pectoris: Secondary | ICD-10-CM | POA: Diagnosis present

## 2022-03-23 DIAGNOSIS — R072 Precordial pain: Secondary | ICD-10-CM

## 2022-03-23 DIAGNOSIS — Z951 Presence of aortocoronary bypass graft: Secondary | ICD-10-CM | POA: Diagnosis not present

## 2022-03-23 DIAGNOSIS — R079 Chest pain, unspecified: Principal | ICD-10-CM | POA: Insufficient documentation

## 2022-03-23 DIAGNOSIS — I25708 Atherosclerosis of coronary artery bypass graft(s), unspecified, with other forms of angina pectoris: Secondary | ICD-10-CM

## 2022-03-23 DIAGNOSIS — E119 Type 2 diabetes mellitus without complications: Secondary | ICD-10-CM

## 2022-03-23 DIAGNOSIS — Z79899 Other long term (current) drug therapy: Secondary | ICD-10-CM | POA: Insufficient documentation

## 2022-03-23 LAB — BASIC METABOLIC PANEL
Anion gap: 7 (ref 5–15)
BUN: 15 mg/dL (ref 8–23)
CO2: 25 mmol/L (ref 22–32)
Calcium: 9 mg/dL (ref 8.9–10.3)
Chloride: 109 mmol/L (ref 98–111)
Creatinine, Ser: 1.21 mg/dL (ref 0.61–1.24)
GFR, Estimated: >60 mL/min (ref >60)
Glucose, Bld: 135 mg/dL — ABNORMAL HIGH (ref 70–99)
Potassium: 4.1 mmol/L (ref 3.5–5.1)
Sodium: 141 mmol/L (ref 135–145)

## 2022-03-23 LAB — CBC
HCT: 41.1 % (ref 39.0–52.0)
Hemoglobin: 13.3 g/dL (ref 13.0–17.0)
MCH: 28.3 pg (ref 26.0–34.0)
MCHC: 32.4 g/dL (ref 30.0–36.0)
MCV: 87.4 fL (ref 80.0–100.0)
Platelets: 109 10*3/uL — ABNORMAL LOW (ref 150–400)
RBC: 4.7 MIL/uL (ref 4.22–5.81)
RDW: 17.2 % — ABNORMAL HIGH (ref 11.5–15.5)
WBC: 4.6 10*3/uL (ref 4.0–10.5)
nRBC: 0 % (ref 0.0–0.2)

## 2022-03-23 LAB — TROPONIN I (HIGH SENSITIVITY)
Troponin I (High Sensitivity): 8 ng/L (ref <18)
Troponin I (High Sensitivity): 8 ng/L (ref <18)
Troponin I (High Sensitivity): 9 ng/L (ref <18)

## 2022-03-23 LAB — GLUCOSE, CAPILLARY: Glucose-Capillary: 132 mg/dL — ABNORMAL HIGH (ref 70–99)

## 2022-03-23 MED ORDER — AMLODIPINE BESYLATE 10 MG PO TABS
10.0000 mg | ORAL_TABLET | Freq: Every day | ORAL | Status: DC
  Administered 2022-03-24: 10 mg via ORAL
  Filled 2022-03-23: qty 1

## 2022-03-23 MED ORDER — ALBUTEROL SULFATE (2.5 MG/3ML) 0.083% IN NEBU: 2.5000 mg | INHALATION_SOLUTION | Freq: Four times a day (QID) | RESPIRATORY_TRACT | Status: DC | PRN

## 2022-03-23 MED ORDER — INSULIN ASPART 100 UNIT/ML IJ SOLN
0.0000 [IU] | Freq: Three times a day (TID) | INTRAMUSCULAR | Status: DC
  Administered 2022-03-24: 1 [IU] via SUBCUTANEOUS

## 2022-03-23 MED ORDER — NITROGLYCERIN 0.4 MG SL SUBL
0.4000 mg | SUBLINGUAL_TABLET | Freq: Once | SUBLINGUAL | Status: AC
  Administered 2022-03-23: 0.4 mg via SUBLINGUAL
  Filled 2022-03-23: qty 1

## 2022-03-23 MED ORDER — SODIUM CHLORIDE 0.9% FLUSH
3.0000 mL | Freq: Two times a day (BID) | INTRAVENOUS | Status: DC
  Administered 2022-03-23 – 2022-03-24 (×2): 3 mL via INTRAVENOUS

## 2022-03-23 MED ORDER — ACETAMINOPHEN 650 MG RE SUPP: 650.0000 mg | Freq: Four times a day (QID) | RECTAL | Status: DC | PRN

## 2022-03-23 MED ORDER — ISOSORBIDE MONONITRATE ER 60 MG PO TB24
120.0000 mg | ORAL_TABLET | Freq: Every day | ORAL | Status: DC
  Administered 2022-03-24: 120 mg via ORAL
  Filled 2022-03-23: qty 2

## 2022-03-23 MED ORDER — PRASUGREL HCL 10 MG PO TABS
10.0000 mg | ORAL_TABLET | Freq: Every day | ORAL | Status: DC
  Administered 2022-03-24: 10 mg via ORAL
  Filled 2022-03-23: qty 1

## 2022-03-23 MED ORDER — FERROUS SULFATE 325 (65 FE) MG PO TABS
325.0000 mg | ORAL_TABLET | Freq: Every day | ORAL | Status: DC
  Administered 2022-03-24: 325 mg via ORAL
  Filled 2022-03-23: qty 1

## 2022-03-23 MED ORDER — ONDANSETRON HCL 4 MG/2ML IJ SOLN: 4.0000 mg | Freq: Four times a day (QID) | INTRAMUSCULAR | Status: DC | PRN

## 2022-03-23 MED ORDER — ONDANSETRON HCL 4 MG PO TABS: 4.0000 mg | ORAL_TABLET | Freq: Four times a day (QID) | ORAL | Status: DC | PRN

## 2022-03-23 MED ORDER — METOPROLOL SUCCINATE ER 50 MG PO TB24
50.0000 mg | ORAL_TABLET | Freq: Every day | ORAL | Status: DC
  Administered 2022-03-24: 50 mg via ORAL
  Filled 2022-03-23: qty 1

## 2022-03-23 MED ORDER — AMLODIPINE BESYLATE 5 MG PO TABS
5.0000 mg | ORAL_TABLET | Freq: Once | ORAL | Status: AC
  Administered 2022-03-23: 5 mg via ORAL
  Filled 2022-03-23: qty 1

## 2022-03-23 MED ORDER — ALPRAZOLAM 0.25 MG PO TABS: 0.2500 mg | ORAL_TABLET | Freq: Two times a day (BID) | ORAL | Status: DC | PRN

## 2022-03-23 MED ORDER — TRAMADOL HCL 50 MG PO TABS: 50.0000 mg | ORAL_TABLET | Freq: Two times a day (BID) | ORAL | Status: DC | PRN

## 2022-03-23 MED ORDER — ACETAMINOPHEN 325 MG PO TABS
650.0000 mg | ORAL_TABLET | Freq: Four times a day (QID) | ORAL | Status: DC | PRN
  Administered 2022-03-23: 650 mg via ORAL
  Filled 2022-03-23: qty 2

## 2022-03-23 MED ORDER — ROSUVASTATIN CALCIUM 20 MG PO TABS
40.0000 mg | ORAL_TABLET | Freq: Every day | ORAL | Status: DC
  Administered 2022-03-24: 40 mg via ORAL
  Filled 2022-03-23: qty 2

## 2022-03-23 MED ORDER — ENOXAPARIN SODIUM 40 MG/0.4ML IJ SOSY
40.0000 mg | PREFILLED_SYRINGE | INTRAMUSCULAR | Status: DC
  Administered 2022-03-23: 40 mg via SUBCUTANEOUS
  Filled 2022-03-23: qty 0.4

## 2022-03-23 NOTE — ED Triage Notes (Signed)
Pt. Stated, ive had chest pain, headache and almost pass out that's been going on for the pass 2 weeks.
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
## 2022-03-23 NOTE — ED Notes (Signed)
Per floor RN ready to receive pt  ?

## 2022-03-23 NOTE — ED Provider Notes (Signed)
[facility] EMERGENCY DEPARTMENT Provider Note   CSN: [id number] Arrival date & time: 03/23/22  4696     History  Chief Complaint  Patient presents with  . Chest Pain  . Headache  . Near Syncope    Antonio [name] is a 76 y.o. male.   Chest Pain Associated symptoms: dizziness, headache, near-syncope and shortness of breath   Associated symptoms: no abdominal pain, no back pain, no cough, no fever, no palpitations and no vomiting   Headache Associated symptoms: dizziness and near-syncope   Associated symptoms: no abdominal pain, no back pain, no cough, no ear pain, no eye pain, no fever, no seizures, no sore throat and no vomiting   Near Syncope Associated symptoms include chest pain, headaches and shortness of breath. Pertinent negatives include no abdominal pain.   76 year old male presents emergency department with complaints of chest pain.  Chest pain is described as pressure, achy and is located on the left side.  It does not radiate.  It has been intermittent over the past 2 weeks with increasing intensity.  He has tried multiple sublingual nitro at home with minimal symptom relief.  Pain is not necessarily associated with physical activity, food.  It is partially relieved with nitroglycerin and rest.  He secondarily endorses shortness of breath as well as dizziness.  Patient states that 2 days ago he had a near syncopal event where his vision was blurry and he felt like he was going to pass out when he was getting out of the shower.  He denies loss of consciousness during the event but the event frightened him.  He recently had a phone conversation with his cardiologist who advised the patient to come to the emergency department for further work-up and evaluation on 03/20/2022.  Past medical history significant for hypertension, unstable angina, coronary artery disease, diabetes type 2, CABG.   Home Medications Prior to Admission medications   Medication Sig  Start Date End Date Taking? Authorizing Provider  acetaminophen (TYLENOL) 650 MG CR tablet Take 650-1,300 mg by mouth every 8 (eight) hours as needed for pain.   Yes [provider]  ALPRAZolam (XANAX) 0.25 MG tablet Take 0.25 mg by mouth 2 (two) times daily as needed for anxiety. 03/12/22  Yes [provider]  amLODipine (NORVASC) 5 MG tablet Take 5 mg by mouth every morning. 03/21/21  Yes [provider]  ferrous sulfate 325 (65 FE) MG tablet Take 1 tablet (325 mg total) by mouth daily. 07/22/21 07/22/22 Yes Elodia Florence., MD  insulin detemir (LEVEMIR FLEXTOUCH) 100 UNIT/ML FlexPen Inject 20 Units into the skin daily as needed (CBG >130).   Yes [provider]  isosorbide mononitrate (IMDUR) 120 MG 24 hr tablet Take 1 tablet (120 mg total) by mouth daily. 09/10/21  Yes Deberah Pelton, NP  metFORMIN (GLUCOPHAGE) 1000 MG tablet Take 1,000 mg by mouth daily. 02/09/21  Yes [provider]  metoprolol succinate (TOPROL-XL) 50 MG 24 hr tablet TAKE 1 TABLET (50 MG TOTAL) BY MOUTH DAILY. TAKE WITH OR IMMEDIATELY FOLLOWING A MEAL. Patient taking differently: Take 50 mg by mouth daily. 01/11/22  Yes Martinique, Peter M, MD  Multiple Vitamin (MULTIVITAMIN WITH MINERALS) TABS Take 1 tablet by mouth every morning.   Yes [provider]  nitroGLYCERIN (NITROSTAT) 0.4 MG SL tablet PLACE 1 TABLET UNDER THE TONGUE EVERY 5 MINUTES FOR 3 DOSES AS NEEDED FOR CHEST PAIN Patient taking differently: Place 0.4 mg under the tongue every  5 (five) minutes as needed for chest pain. 01/31/22  Yes Martinique, Peter M, MD  prasugrel (EFFIENT) 10 MG TABS tablet TAKE 1 TABLET (10 MG TOTAL) BY MOUTH DAILY. Patient taking differently: Take 10 mg by mouth daily. 02/28/22  Yes Martinique, Peter M, MD  rosuvastatin (CRESTOR) 40 MG tablet TAKE 1 TABLET (40 MG TOTAL) BY MOUTH DAILY. 08/02/21  Yes Martinique, Peter M, MD  sitaGLIPtin (JANUVIA) 100 MG tablet Take 100 mg by mouth every morning.   Yes  [provider]  traMADol (ULTRAM) 50 MG tablet Take 50 mg by mouth 2 (two) times daily as needed for pain. 02/21/22  Yes [provider]  Insulin Pen Needle (BD PEN NEEDLE NANO U/F) 32G X 4 MM MISC 1 pen by Does not apply route daily. 11/11/13   Kyra Leyland, PA-C  tamsulosin (FLOMAX) 0.4 MG CAPS capsule Take 0.4 mg by mouth every morning. Patient not taking: Reported on 03/23/2022 07/10/21   [provider]      Allergies    Demerol    Review of Systems   Review of Systems  Constitutional:  Negative for chills and fever.  HENT:  Negative for ear pain and sore throat.   Eyes:  Negative for pain and visual disturbance.  Respiratory:  Positive for shortness of breath. Negative for cough.   Cardiovascular:  Positive for chest pain and near-syncope. Negative for palpitations.  Gastrointestinal:  Negative for abdominal pain and vomiting.  Genitourinary:  Negative for dysuria and hematuria.  Musculoskeletal:  Negative for arthralgias and back pain.  Skin:  Negative for color change and rash.  Neurological:  Positive for dizziness and headaches. Negative for seizures and syncope.  All other systems reviewed and are negative.   Physical Exam Updated Vital Signs BP (!) 164/98   Pulse (!) 56   Temp 98.5 F (36.9 C) (Oral)   Resp 15   Ht 5' 7.5" (1.715 m)   Wt 83.9 kg   SpO2 98%   BMI 28.55 kg/m  Physical Exam Vitals and nursing note reviewed.  Constitutional:      General: He is not in acute distress.    Appearance: He is well-developed.  HENT:     Head: Normocephalic and atraumatic.  Eyes:     Extraocular Movements: Extraocular movements intact.     Conjunctiva/sclera: Conjunctivae normal.  Cardiovascular:     Rate and Rhythm: Normal rate and regular rhythm.     Heart sounds: Murmur heard.     Systolic murmur is present.  Pulmonary:     Effort: Pulmonary effort is normal. No respiratory distress.     Breath sounds: Normal breath sounds.   Abdominal:     Palpations: Abdomen is soft.     Tenderness: There is no abdominal tenderness. There is no guarding.  Musculoskeletal:        General: No swelling. Normal range of motion.     Cervical back: Normal range of motion and neck supple.     Right lower leg: No tenderness. No edema.     Left lower leg: No tenderness. No edema.  Skin:    General: Skin is warm and dry.     Capillary Refill: Capillary refill takes less than 2 seconds.  Neurological:     General: No focal deficit present.     Mental Status: He is alert and oriented to person, place, and time.  Psychiatric:        Mood and Affect: Mood normal.  ED Results / Procedures / Treatments   Labs (all labs ordered are listed, but only abnormal results are displayed) Labs Reviewed  BASIC METABOLIC PANEL - Abnormal; Notable for the following components:      Result Value   Glucose, Bld 135 (*)    All other components within normal limits  CBC - Abnormal; Notable for the following components:   RDW 17.2 (*)    Platelets 109 (*)    All other components within normal limits  TROPONIN I (HIGH SENSITIVITY)  TROPONIN I (HIGH SENSITIVITY)    EKG None  Radiology DG Chest 2 View  Result Date: 03/23/2022 CLINICAL DATA:  Chest pain EXAM: CHEST - 2 VIEW COMPARISON:  12/27/2020 FINDINGS: Transverse diameter of heart is increased. Thoracic aorta is tortuous. There is previous coronary bypass surgery. There is surgical fusion in the lower cervical spine. Lung fields are clear of any pulmonary edema or focal consolidation. There is no pleural effusion or pneumothorax. IMPRESSION: Cardiomegaly. There are no signs of pulmonary edema or focal pulmonary consolidation. Electronically Signed   By: Elmer Picker M.D.   On: 03/23/2022 11:27    Procedures Procedures    Medications Ordered in ED Medications  nitroGLYCERIN (NITROSTAT) SL tablet 0.4 mg (0.4 mg Sublingual Given 03/23/22 1439)    ED Course/ Medical Decision  Making/ A&P Clinical Course as of 03/23/22 1553  Sat Mar 23, 2022  1520 Consulted cardiology Dr. Stanford Breed.  They will see patient in the hospital once he is admitted. [CR]  1552 Consulted hospital medicine Dr. Tamala Julian.  He agreed with admission and assuming further treatment/care of patient. [CR]    Clinical Course User Index [CR] Wilnette Kales, PA                           Medical Decision Making Amount and/or Complexity of Data Reviewed Labs: ordered. Radiology: ordered.  Risk Prescription drug management.   This patient presents to the ED for concern of chest pain, this involves an extensive number of treatment options, and is a complaint that carries with it a high risk of complications and morbidity.  The differential diagnosis includes The emergent causes of chest pain include: Acute coronary syndrome, tamponade, pericarditis/myocarditis, aortic dissection, pulmonary embolism, tension pneumothorax, pneumonia, and esophageal rupture. other urgent/non-acute considerations include, but are not limited to: chronic angina, aortic stenosis, cardiomyopathy, mitral valve prolapse, pulmonary hypertension, aortic insufficiency, right ventricular hypertrophy, pleuritis, bronchitis, pneumothorax, tumor, gastroesophageal reflux disease (GERD), esophageal spasm, Mallory-Weiss syndrome, peptic ulcer disease, pancreatitis, functional gastrointestinal pain, cervical or thoracic disk disease or arthritis, shoulder arthritis, costochondritis, subacromial bursitis, anxiety or panic attack, herpes zoster, breast disorders, chest wall tumors, thoracic outlet syndrome, mediastinitis.   Co morbidities that complicate the patient evaluation   hypertension, unstable angina, coronary artery disease, diabetes type 2, CABG.    Additional history obtained:  Additional history obtained from prior cardiac catheterization from 12/28/2020 indicating External records from outside source obtained and reviewed  including mid LAD is 100% stenosed, first septal lesion is 90% stenosed, second marginal lesion is 100% stenosed, mid RCA to distal RCA lesion is 100% stenosed stenosed, proximal RCA lesion is 100% stenosed, RPDA lesion is 100% stenosis, SVG, noncirrhotic origin lesion was previously treated, SVG, mid graft lesion is 60% stenosis, nonstenotic origin lesion was previously treated, LIMA and is normal in caliber, mid Cx lesion is 100% stenosed, third marginal lesion is 100% stenosed, left ventricular end-diastolic pressure is normal.   Lab  Tests:  I Ordered, and personally interpreted labs.  The pertinent results include: No acute abnormality   Imaging Studies ordered:  I ordered imaging studies including chest x-ray I independently visualized and interpreted imaging which showed cardiomegaly, no acute cardiopulmonary disease I agree with the radiologist interpretation   Cardiac Monitoring: / EKG:  The patient was maintained on a cardiac monitor.  I personally viewed and interpreted the cardiac monitored which showed an underlying rhythm of: Sinus rhythm   Consultations Obtained:  I requested consultation with the cardiology and hospital medicine,  and discussed lab and imaging findings as well as pertinent plan - they recommend: Cardiologist recommended admission unable see him in the hospital, hospital medicine   Problem List / ED Course / Critical interventions / Medication management  Chest pain I ordered medication including sublingual nitro for chest pain  Reevaluation of the patient after these medicines showed that the patient improved I have reviewed the patients home medicines and have made adjustments as needed   Social Determinants of Health:  Denies tobacco, illicit drug use including cocaine   Test / Admission - Considered:  Chest pain Vitals signs significant for hypertension with a blood pressure of 169/105. Otherwise within normal range and stable throughout  visit. Laboratory/imaging studies significant for: No acute abnormalities Given patient's chronic chest pain that has increased over the past 2 weeks it is no longer relieved with sublingual nitro, admission seems most appropriate.  Work-up in the ED was overall negative with negative delta troponin, unchanged EKG. concern for unstable angina given patient's history and persistently worsening symptoms.  Cardiologist consulted and they agreed to see him in the hospital.  Hospital medicine Dr. Tamala Julian was consulted and he agreed with admission and assuming further treatment/care of patient.  Patient was educated regarding treatment plan and he knowledge understanding was agreeable.  Patient was stable upon admission.          Final Clinical Impression(s) / ED Diagnoses Final diagnoses:  Unstable angina [facility])    Rx / DC Orders ED Discharge Orders     None         Wilnette Kales, [place] 03/23/22 1553    Lucrezia Starch, MD 03/24/22 [phone]

## 2022-03-23 NOTE — ED Notes (Signed)
Pt denies CP at present - pt resting in bed at this time - no other needs expressed at this time
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
## 2022-03-24 DIAGNOSIS — Z951 Presence of aortocoronary bypass graft: Secondary | ICD-10-CM | POA: Diagnosis not present

## 2022-03-24 DIAGNOSIS — R072 Precordial pain: Secondary | ICD-10-CM | POA: Diagnosis not present

## 2022-03-24 DIAGNOSIS — I25708 Atherosclerosis of coronary artery bypass graft(s), unspecified, with other forms of angina pectoris: Secondary | ICD-10-CM | POA: Diagnosis not present

## 2022-03-24 DIAGNOSIS — E1169 Type 2 diabetes mellitus with other specified complication: Secondary | ICD-10-CM | POA: Diagnosis not present

## 2022-03-24 LAB — TROPONIN I (HIGH SENSITIVITY): Troponin I (High Sensitivity): 10 ng/L (ref <18)

## 2022-03-24 LAB — HEMOGLOBIN A1C
Hgb A1c MFr Bld: 7.7 % — ABNORMAL HIGH (ref 4.8–5.6)
Mean Plasma Glucose: 174.29 mg/dL

## 2022-03-24 LAB — GLUCOSE, CAPILLARY: Glucose-Capillary: 129 mg/dL — ABNORMAL HIGH (ref 70–99)

## 2022-03-24 MED ORDER — AMLODIPINE BESYLATE 10 MG PO TABS: 10.0000 mg | ORAL_TABLET | Freq: Every morning | ORAL | 0 refills | Status: DC

## 2022-03-24 NOTE — Progress Notes (Signed)
Progress Note  Patient Name: Antonio [name] Date of Encounter: 03/24/2022  [facility] Cardiologist: Peter Martinique, MD   Subjective   No CP or dyspnea  Inpatient Medications    Scheduled Meds:  amLODipine  10 mg Oral Daily   enoxaparin (LOVENOX) injection  40 mg Subcutaneous Q24H   ferrous sulfate  325 mg Oral Daily   insulin aspart  0-9 Units Subcutaneous TID WC   isosorbide mononitrate  120 mg Oral Daily   metoprolol succinate  50 mg Oral Daily   prasugrel  10 mg Oral Daily   rosuvastatin  40 mg Oral Daily   sodium chloride flush  3 mL Intravenous Q12H   Continuous Infusions:  PRN Meds: acetaminophen **OR** acetaminophen, albuterol, ALPRAZolam, ondansetron **OR** ondansetron (ZOFRAN) IV, traMADol   Vital Signs    Vitals:   03/23/22 1930 03/23/22 2030 03/23/22 2136 03/24/22 0427  BP: (!) 166/92 (!) 169/96 (!) 179/105 (!) 162/89  Pulse: 60  62 (!) 52  Resp: 17 (!) '21 20 18  '$ Temp: 98 F (36.7 C)  98.3 F (36.8 C) 98.3 F (36.8 C)  TempSrc:   Oral Oral  SpO2: 98%  95% 98%  Weight:    79.6 kg  Height:        Intake/Output Summary (Last 24 hours) at 03/24/2022 0759 Last data filed at 03/24/2022 0420 Gross per 24 hour  Intake --  Output 300 ml  Net -300 ml      03/24/2022    4:27 AM 03/23/2022   10:15 AM 12/25/2021    2:43 PM  Last 3 Weights  Weight (lbs) 175 lb 8 oz 185 lb 177 lb 8 oz  Weight (kg) 79.606 kg 83.915 kg 80.513 kg      Telemetry    Sinus bradycardia - Personally Reviewed   Physical Exam   GEN: No acute distress.   Neck: No JVD Cardiac: RRR, no murmurs, rubs, or gallops.  Respiratory: Clear to auscultation bilaterally. GI: Soft, nontender, non-distended  MS: No edema Neuro:  Nonfocal  Psych: Normal affect   Labs    High Sensitivity Troponin:   Recent Labs  Lab 03/23/22 1028 03/23/22 1218 03/23/22 1610 03/23/22 2335  TROPONINIHS '8 8 9 10     '$ Chemistry Recent Labs  Lab 03/23/22 1028  NA 141  K 4.1  CL 109  CO2 25   GLUCOSE 135*  BUN 15  CREATININE 1.21  CALCIUM 9.0  GFRNONAA >60  ANIONGAP 7     Hematology Recent Labs  Lab 03/23/22 1028  WBC 4.6  RBC 4.70  HGB 13.3  HCT 41.1  MCV 87.4  MCH 28.3  MCHC 32.4  RDW 17.2*  PLT 109*    Radiology    DG Chest 2 View  Result Date: 03/23/2022 CLINICAL DATA:  Chest pain EXAM: CHEST - 2 VIEW COMPARISON:  12/27/2020 FINDINGS: Transverse diameter of heart is increased. Thoracic aorta is tortuous. There is previous coronary bypass surgery. There is surgical fusion in the lower cervical spine. Lung fields are clear of any pulmonary edema or focal consolidation. There is no pleural effusion or pneumothorax. IMPRESSION: Cardiomegaly. There are no signs of pulmonary edema or focal pulmonary consolidation. Electronically Signed   By: Elmer Picker M.D.   On: 03/23/2022 11:27     Patient Profile     Antonio [name] is a 76 y.o. male with a hx of coronary artery disease status post coronary artery bypass graft, diabetes mellitus, hypertension, hyperlipidemia, prostate cancer, gastroesophageal  reflux disease who is being seen 03/23/2022 for the evaluation of chest pain at the request of Madalyn [name], MD. Patient is status post coronary artery bypass and graft in 1996.  Patient has had a number of cardiac catheterizations over the years for chest pain showing patent grafts.  Last cardiac catheterization March 2022 showed occluded LAD, occluded circumflex, occluded second and third marginal, occluded right coronary artery, occluded PDA; patent LIMA to the LAD, patent saphenous vein graft to the diagonal with widely patent proximal stent, patent sequential saphenous vein graft to OM1 and OM 2 and 60% stenosis in mid graft to the PDA; distal PDA now occluded.  There are some left-to-right collaterals in the PDA.  Medical therapy recommended.  Echocardiogram October 2022 showed normal LV function, grade 2 diastolic dysfunction, mild biatrial enlargement, moderate  mitral regurgitation, moderate tricuspid regurgitation.   Assessment & Plan    Chest pain-as outlined previously patient does have a history of occasional chest pain in the past and follow-up catheterizations are as outlined with medical therapy recommended.  However he has significant coronary disease.  His enzymes are negative.  We will plan to continue medical therapy with prasugrel, Crestor, Toprol, isosorbide and amlodipine.  We will arrange outpatient [facility] nuclear study for risk stratification.  He will then follow-up with APP in 2 to 4 weeks and then Dr. Martinique in 3 months.   Hypertension-patient blood pressure has been elevated.  Amlodipine was increased.  Would continue present medications at discharge.  He will follow his blood pressure as an outpatient and we can advance regimen as needed. Hyperlipidemia-continue statin. Coronary artery disease status post coronary bypass and graft-plan to continue medications as outlined above. Diabetes mellitus-Per primary care.  Patient can be discharged from a cardiac standpoint.  We will arrange outpatient nuclear study as outlined above and follow-up.  Continue present medications at discharge.  For questions or updates, please contact [place] Please consult [email or web address] for contact info under        Signed, Kirk Ruths, MD  03/24/2022, 7:59 AM

## 2022-03-24 NOTE — Discharge Summary (Signed)
Physician Discharge Summary   Patient: Antonio [name] MRN: [id number] DOB: [birth year]  Admit date:     03/23/2022  Discharge date: 03/24/22  Discharge Physician: Patrecia Pour   PCP: [name], MD   Recommendations at discharge:  Follow up with PCP and cardiology with recheck BP. Increased dose of norvasc '5mg'$  > '10mg'$  at discharge.  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  Discharge Diagnoses: Principal Problem:   Chest pain Active Problems:   Type II diabetes mellitus ([place])   Hyperlipidemia associated with type 2 diabetes mellitus ([place])   CAD (coronary artery disease)   S/P CABG (coronary artery bypass graft)  Hospital Course: HPI: Antonio [name] is a 76 y.o. male with medical history significant of hypertension, hyperlipidemia, CAD s/p CABG in 1996 and stents in 2020, DM type II, and history of GI bleed presented with complaints of recurrent chest pain for the last 2 weeks.  Pain is located in the left chest, and feels like something is grabbing or squeezing him.  Pain does not radiate and rates it as a 7 out of 10 at its worst. Pain occurred at rest and with exertion.  Chest pain would self resolve in 5 min or so.  Denies having any nausea, vomiting, or diaphoresis with the episodes.  He does have some shortness of breath.  Last week he reports after getting out of shower feeling as though he may pass out on, but denied having any pain at that time.  He does admit that he does not necessarily abide by heart healthy diet, and had eaten pork yesterday.  He has been compliant with his blood pressure medications and had taken them this morning prior to coming to the hospital.   Upon admission in the emergency department patient was noted pulse 56-61, blood pressure was elevated at 194/97, and all other vital signs maintained.  Labs noted platelet count 109 and high-sensitivity troponins negative x2.  Cardiology had been consulted.   [facility] called to admit.  Per cardiology consultation:  He is is status post coronary artery bypass and graft in 1996.  Patient has had a number of cardiac catheterizations over the years for chest pain showing patent grafts.  Last cardiac catheterization March 2022 showed occluded LAD, occluded circumflex, occluded second and third marginal, occluded right coronary artery, occluded PDA; patent LIMA to the LAD, patent saphenous vein graft to the diagonal with widely patent proximal stent, patent sequential saphenous vein graft to OM1 and OM 2 and 60% stenosis in mid graft to the PDA; distal PDA now occluded.  There are some left-to-right collaterals in the PDA.  Medical therapy recommended.  Echocardiogram October 2022 showed normal LV function, grade 2 diastolic dysfunction, mild biatrial enlargement, moderate mitral regurgitation, moderate tricuspid regurgitation.  The patient's chest pain abated, enzymes remains negative. Outpatient [facility] nuclear study is planned per cardiology, and the patient is stable to discharge.  Assessment and Plan: Recurrent chest pain: resolved.  - continue medical therapy with prasugrel, Crestor, Toprol, isosorbide and amlodipine.  Hypertensive urgency: Improved with augmentation of norvasc which will continue at discharge.    CAD s/p CABG Patient with prior history of coronary artery bypass in 1996 and PCI in 2020. -Continue medications as above - Outpatient myoview per cardiology to arrange.   Diabetes mellitus type 2 Last hemoglobin A1c 7.6 back in 2022. Continue home medications at discharge.   Hyperlipidemia -Continue statin   Thrombocytopenia Acute on chronic.  Platelet count  109 on admission.  No reports of bleeding  Consultants: Cardiology Procedures performed: None  Disposition: Home Diet recommendation:  Discharge Diet Orders (From admission, onward)     Start     Ordered   03/24/22 0000  Diet - low sodium heart healthy        03/24/22 0831           DISCHARGE MEDICATION: Allergies as of 03/24/2022       Reactions   Demerol Other (See Comments)   hallucinations        Medication List     TAKE these medications    acetaminophen 650 MG CR tablet Commonly known as: TYLENOL Take 650-1,300 mg by mouth every 8 (eight) hours as needed for pain.   ALPRAZolam 0.25 MG tablet Commonly known as: XANAX Take 0.25 mg by mouth 2 (two) times daily as needed for anxiety.   amLODipine 10 MG tablet Commonly known as: NORVASC Take 1 tablet (10 mg total) by mouth every morning. What changed:  medication strength how much to take   ferrous sulfate 325 (65 FE) MG tablet Take 1 tablet (325 mg total) by mouth daily.   Insulin Pen Needle 32G X 4 MM Misc Commonly known as: BD Pen Needle Nano U/F 1 pen by Does not apply route daily.   isosorbide mononitrate 120 MG 24 hr tablet Commonly known as: IMDUR Take 1 tablet (120 mg total) by mouth daily.   Levemir FlexTouch 100 UNIT/ML FlexPen Generic drug: insulin detemir Inject 20 Units into the skin daily as needed (CBG >130).   metFORMIN 1000 MG tablet Commonly known as: GLUCOPHAGE Take 1,000 mg by mouth daily.   metoprolol succinate 50 MG 24 hr tablet Commonly known as: TOPROL-XL TAKE 1 TABLET (50 MG TOTAL) BY MOUTH DAILY. TAKE WITH OR IMMEDIATELY FOLLOWING A MEAL. What changed: additional instructions   multivitamin with minerals Tabs tablet Take 1 tablet by mouth every morning.   nitroGLYCERIN 0.4 MG SL tablet Commonly known as: NITROSTAT PLACE 1 TABLET UNDER THE TONGUE EVERY 5 MINUTES FOR 3 DOSES AS NEEDED FOR CHEST PAIN What changed: See the new instructions.   prasugrel 10 MG Tabs tablet Commonly known as: EFFIENT TAKE 1 TABLET (10 MG TOTAL) BY MOUTH DAILY.   rosuvastatin 40 MG tablet Commonly known as: CRESTOR TAKE 1 TABLET (40 MG TOTAL) BY MOUTH DAILY.   sitaGLIPtin 100 MG tablet Commonly known as: JANUVIA Take 100 mg by mouth every morning.    traMADol 50 MG tablet Commonly known as: ULTRAM Take 50 mg by mouth 2 (two) times daily as needed for pain.        Follow-up Information     [name], MD. Schedule an appointment as soon as possible for a visit in 1 week(s).   Specialty: Internal Medicine Contact information: [place], [place] [address] [phone]         Martinique, Peter M, MD .   Specialty: Cardiology Contact information: [address] [place] [address] [phone]                Discharge Exam: Danley Danker Weights   03/23/22 1015 03/24/22 0427  Weight: 83.9 kg 79.6 kg  BP (!) 162/89 (BP Location: Left Arm)   Pulse (!) 52   Temp 98.3 F (36.8 C) (Oral)   Resp 18   Ht 5' 7.5" (1.715 m)   Wt 79.6 kg   SpO2 98%   BMI 27.08 kg/m   No distress, well-appearing  RRR, no MRG or edema Clear, nonlabored  Condition at discharge: stable  The results of significant diagnostics from this hospitalization (including imaging, microbiology, ancillary and laboratory) are listed below for reference.   Imaging Studies: DG Chest 2 View  Result Date: 03/23/2022 CLINICAL DATA:  Chest pain EXAM: CHEST - 2 VIEW COMPARISON:  12/27/2020 FINDINGS: Transverse diameter of heart is increased. Thoracic aorta is tortuous. There is previous coronary bypass surgery. There is surgical fusion in the lower cervical spine. Lung fields are clear of any pulmonary edema or focal consolidation. There is no pleural effusion or pneumothorax. IMPRESSION: Cardiomegaly. There are no signs of pulmonary edema or focal pulmonary consolidation. Electronically Signed   By: Elmer Picker M.D.   On: 03/23/2022 11:27    Microbiology: Results for orders placed or performed during the hospital encounter of 07/13/21  Culture, blood (routine x 2)     Status: None   Collection Time: 07/13/21  1:00 PM   Specimen: BLOOD RIGHT ARM  Result Value Ref Range Status   Specimen Description BLOOD RIGHT ARM  Final    Special Requests   Final    BOTTLES DRAWN AEROBIC AND ANAEROBIC Blood Culture results may not be optimal due to an inadequate volume of blood received in culture bottles   Culture   Final    NO GROWTH 5 DAYS Performed at [facility], [place] [address], [place], [place] [address]    Report Status 07/18/2021 FINAL  Final  Urine Culture     Status: Abnormal   Collection Time: 07/13/21  1:00 PM   Specimen: Urine, Catheterized  Result Value Ref Range Status   Specimen Description URINE, CATHETERIZED  Final   Special Requests   Final    NONE Performed at [facility], [place] [address], [place], [place] [address]    Culture MULTIPLE SPECIES PRESENT, SUGGEST RECOLLECTION (A)  Final   Report Status 07/14/2021 FINAL  Final  Culture, blood (routine x 2)     Status: None   Collection Time: 07/13/21  1:08 PM   Specimen: BLOOD LEFT ARM  Result Value Ref Range Status   Specimen Description BLOOD LEFT ARM  Final   Special Requests   Final    BOTTLES DRAWN AEROBIC AND ANAEROBIC Blood Culture adequate volume   Culture   Final    NO GROWTH 5 DAYS Performed at [facility], [place] [address] [place],  [address]    Report Status 07/18/2021 FINAL  Final  SARS CORONAVIRUS 2 (TAT 6-24 HRS) Nasopharyngeal Nasopharyngeal Swab     Status: None   Collection Time: 07/13/21 11:09 PM   Specimen: Nasopharyngeal Swab  Result Value Ref Range Status   SARS Coronavirus 2 NEGATIVE NEGATIVE Final    Comment: (NOTE) SARS-CoV-2 target nucleic acids are NOT DETECTED.  The SARS-CoV-2 RNA is generally detectable in upper and lower respiratory specimens during the acute phase of infection. Negative results do not preclude SARS-CoV-2 infection, do not rule out co-infections with other pathogens, and should not be used as the sole basis for treatment or other patient management decisions. Negative results must be combined with clinical observations, patient history, and epidemiological information.  The expected result is Negative.  Fact Sheet for Patients: SugarRoll.be  Fact Sheet for Healthcare Providers: https://www.woods-mathews.com/  This test is not yet approved or cleared by the [place] FDA and  has been authorized for detection and/or diagnosis of SARS-CoV-2 by FDA under an Emergency Use Authorization (EUA). This EUA  will remain  in effect (meaning this test can be used) for the duration of the COVID-19 declaration under Se ction 564(b)(1) of the Act, 21 U.S.C. section 360bbb-3(b)(1), unless the authorization is terminated or revoked sooner.  Performed at [facility], [place] [address] [place], [place] [address]   Surgical pcr screen     Status: None   Collection Time: 07/17/21 10:19 PM   Specimen: Nasal Mucosa; Nasal Swab  Result Value Ref Range Status   MRSA, PCR NEGATIVE NEGATIVE Final   Staphylococcus aureus NEGATIVE NEGATIVE Final    Comment: (NOTE) The Xpert SA Assay (FDA approved for NASAL specimens in patients 47 years of age and older), is one component of a comprehensive surveillance program. It is not intended to diagnose infection nor to guide or monitor treatment. Performed at [facility], [place] [address] [place], [place] [address]     Labs: CBC: Recent Labs  Lab 03/23/22 1028  WBC 4.6  HGB 13.3  HCT 41.1  MCV 87.4  PLT 343*   Basic Metabolic Panel: Recent Labs  Lab 03/23/22 1028  NA 141  K 4.1  CL 109  CO2 25  GLUCOSE 135*  BUN 15  CREATININE 1.21  CALCIUM 9.0   Liver Function Tests: No results for input(s): "AST", "ALT", "ALKPHOS", "BILITOT", "PROT", "ALBUMIN" in the last 168 hours. CBG: Recent Labs  Lab 03/23/22 2144 03/24/22 0805  GLUCAP 132* 129*    Discharge time spent: greater than 30 minutes.  Signed: Patrecia Pour, MD [facility] 03/24/2022
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
## 2022-04-17 ENCOUNTER — Ambulatory Visit (INDEPENDENT_AMBULATORY_CARE_PROVIDER_SITE_OTHER): Payer: Medicare Other | Admitting: General Practice

## 2022-04-17 ENCOUNTER — Encounter: Payer: Self-pay | Admitting: General Practice

## 2022-04-17 VITALS — BP 108/70 | HR 57 | Ht 67.5 in | Wt 176.4 lb

## 2022-04-17 DIAGNOSIS — R319 Hematuria, unspecified: Secondary | ICD-10-CM

## 2022-04-17 DIAGNOSIS — E785 Hyperlipidemia, unspecified: Secondary | ICD-10-CM

## 2022-04-17 DIAGNOSIS — R079 Chest pain, unspecified: Secondary | ICD-10-CM | POA: Diagnosis not present

## 2022-04-17 DIAGNOSIS — E119 Type 2 diabetes mellitus without complications: Secondary | ICD-10-CM

## 2022-04-17 DIAGNOSIS — I25700 Atherosclerosis of coronary artery bypass graft(s), unspecified, with unstable angina pectoris: Secondary | ICD-10-CM

## 2022-04-17 DIAGNOSIS — I10 Essential (primary) hypertension: Secondary | ICD-10-CM

## 2022-04-17 DIAGNOSIS — Z794 Long term (current) use of insulin: Secondary | ICD-10-CM

## 2022-04-17 MED ORDER — METOPROLOL SUCCINATE ER 50 MG PO TB24: 50.0000 mg | ORAL_TABLET | Freq: Every day | ORAL | 3 refills | Status: DC

## 2022-04-17 MED ORDER — PRASUGREL HCL 10 MG PO TABS: 10.0000 mg | ORAL_TABLET | Freq: Every day | ORAL | 3 refills | Status: DC

## 2022-04-17 MED ORDER — ISOSORBIDE MONONITRATE ER 120 MG PO TB24: 120.0000 mg | ORAL_TABLET | Freq: Every day | ORAL | 3 refills | Status: DC

## 2022-04-17 MED ORDER — AMLODIPINE BESYLATE 10 MG PO TABS: 10.0000 mg | ORAL_TABLET | Freq: Every morning | ORAL | 3 refills | Status: AC

## 2022-04-17 NOTE — Patient Instructions (Signed)
Medication Instructions:  REFILLS SENT  *If you need a refill on your cardiac medications before your next appointment, please call your pharmacy*  Lab Work:   Testing/Procedures:  NONE    NONE  If you have labs (blood work) drawn today and your tests are completely normal, you will receive your results only by: [place] (if you have MyChart) OR  A paper copy in the mail If you have any lab test that is abnormal or we need to change your treatment, we will call you to review the results.  Special Instructions PLEASE READ AND FOLLOW SALTY 6-ATTACHED-1,'800mg'$  daily  PLEASE INCREASE PHYSICAL ACTIVITY AS TOLERATED   Follow-Up: Your next appointment:  12 month(s) In Person with Peter Martinique, MD    Please call our office 2 months in advance to schedule this appointment  :1  At [facility], you and your health needs are our priority.  As part of our continuing mission to provide you with exceptional heart care, we have created designated Provider Care Teams.  These Care Teams include your primary Cardiologist (physician) and Advanced Practice Providers (APPs -  Physician Assistants and Nurse Practitioners) who all work together to provide you with the care you need, when you need it.  Important Information About Sugar             6 SALTY THINGS TO AVOID     1,'800MG'$  DAILY

## 2022-06-26 ENCOUNTER — Emergency Department (HOSPITAL_COMMUNITY)
Admission: EM | Admit: 2022-06-26 | Discharge: 2022-06-26 | Disposition: A | Discharge status: Home / Self Care | Payer: Medicare Other | Attending: Emergency Medicine | Admitting: Emergency Medicine

## 2022-06-26 ENCOUNTER — Emergency Department (HOSPITAL_COMMUNITY): Payer: Medicare Other

## 2022-06-26 ENCOUNTER — Other Ambulatory Visit: Payer: Self-pay

## 2022-06-26 DIAGNOSIS — Z794 Long term (current) use of insulin: Secondary | ICD-10-CM | POA: Diagnosis not present

## 2022-06-26 DIAGNOSIS — I10 Essential (primary) hypertension: Secondary | ICD-10-CM | POA: Diagnosis not present

## 2022-06-26 DIAGNOSIS — R079 Chest pain, unspecified: Secondary | ICD-10-CM | POA: Insufficient documentation

## 2022-06-26 DIAGNOSIS — Z79899 Other long term (current) drug therapy: Secondary | ICD-10-CM | POA: Diagnosis not present

## 2022-06-26 DIAGNOSIS — E119 Type 2 diabetes mellitus without complications: Secondary | ICD-10-CM | POA: Insufficient documentation

## 2022-06-26 DIAGNOSIS — Z951 Presence of aortocoronary bypass graft: Secondary | ICD-10-CM | POA: Diagnosis not present

## 2022-06-26 LAB — CBC WITH DIFFERENTIAL/PLATELET
Abs Immature Granulocytes: 0.03 10*3/uL (ref 0.00–0.07)
Basophils Absolute: 0 10*3/uL (ref 0.0–0.1)
Basophils Relative: 0 %
Eosinophils Absolute: 0.2 10*3/uL (ref 0.0–0.5)
Eosinophils Relative: 3 %
HCT: 40.6 % (ref 39.0–52.0)
Hemoglobin: 13.8 g/dL (ref 13.0–17.0)
Immature Granulocytes: 1 %
Lymphocytes Relative: 31 %
Lymphs Abs: 1.9 10*3/uL (ref 0.7–4.0)
MCH: 29.6 pg (ref 26.0–34.0)
MCHC: 34 g/dL (ref 30.0–36.0)
MCV: 87.1 fL (ref 80.0–100.0)
Monocytes Absolute: 0.5 10*3/uL (ref 0.1–1.0)
Monocytes Relative: 9 %
Neutro Abs: 3.3 10*3/uL (ref 1.7–7.7)
Neutrophils Relative %: 56 %
Platelets: 108 10*3/uL — ABNORMAL LOW (ref 150–400)
RBC: 4.66 MIL/uL (ref 4.22–5.81)
RDW: 14.8 % (ref 11.5–15.5)
WBC: 5.9 10*3/uL (ref 4.0–10.5)
nRBC: 0 % (ref 0.0–0.2)

## 2022-06-26 LAB — COMPREHENSIVE METABOLIC PANEL
ALT: 19 U/L (ref 0–44)
AST: 25 U/L (ref 15–41)
Albumin: 3.8 g/dL (ref 3.5–5.0)
Alkaline Phosphatase: 53 U/L (ref 38–126)
Anion gap: 10 (ref 5–15)
BUN: 14 mg/dL (ref 8–23)
CO2: 26 mmol/L (ref 22–32)
Calcium: 9.1 mg/dL (ref 8.9–10.3)
Chloride: 105 mmol/L (ref 98–111)
Creatinine, Ser: 1.01 mg/dL (ref 0.61–1.24)
GFR, Estimated: >60 mL/min (ref >60)
Glucose, Bld: 110 mg/dL — ABNORMAL HIGH (ref 70–99)
Potassium: 3.1 mmol/L — ABNORMAL LOW (ref 3.5–5.1)
Sodium: 141 mmol/L (ref 135–145)
Total Bilirubin: 1 mg/dL (ref 0.3–1.2)
Total Protein: 6.7 g/dL (ref 6.5–8.1)

## 2022-06-26 LAB — TROPONIN I (HIGH SENSITIVITY)
Troponin I (High Sensitivity): 11 ng/L (ref <18)
Troponin I (High Sensitivity): 11 ng/L (ref <18)

## 2022-06-26 LAB — D-DIMER, QUANTITATIVE: D-Dimer, Quant: 0.45 ug/mL-FEU (ref 0.00–0.50)

## 2022-06-26 MED ORDER — POTASSIUM CHLORIDE 20 MEQ PO PACK
40.0000 meq | PACK | Freq: Once | ORAL | Status: AC
  Administered 2022-06-26: 40 meq via ORAL
  Filled 2022-06-26: qty 2

## 2022-06-26 NOTE — ED Notes (Signed)
ED Provider at bedside. 

## 2022-06-26 NOTE — ED Notes (Signed)
RN reivewed discharge instructions with pt. Pt verbalized understanding and had no further questions. VSS upon discharge

## 2022-06-26 NOTE — ED Provider Notes (Signed)
[facility] EMERGENCY DEPARTMENT Provider Note   CSN: [id number] Arrival date & time: 06/26/22  0244     History  Chief Complaint  Patient presents with   Chest Pain    Antonio [name] is a 76 y.o. male, history of diabetes, hypertension, CABG, 2 stents, who presents to the ED secondary to central, heavy, chest pain that has been going on for the past 35 minutes and woke him up from his sleep.  He took a nitroglycerin about 15 minutes ago, and his pain went from a 10 out of 10 to a 2 out of 10.  He states he has some associated shortness of breath with this, finds that his chest pain is nonexertional, but at rest.  Denies aggravating factors.  Only alleviating factor has been the nitroglycerin.  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  Also had a CABG.  Cardiologist is Dr. Ferdinand Lango.  Denies any back pain, radiation to the jaw, neck, or arm. Endorses some dizziness and nausea.  Was given '324mg'$  aspirin by EMS.   Chest Pain      Home Medications Prior to Admission medications   Medication Sig Start Date End Date Taking? Authorizing Provider  acetaminophen (TYLENOL) 650 MG CR tablet Take 650-1,300 mg by mouth every 8 (eight) hours as needed for pain.    [provider]  ALPRAZolam Duanne Moron) 0.25 MG tablet Take 0.25 mg by mouth 2 (two) times daily as needed for anxiety. 03/12/22   [provider]  amLODipine (NORVASC) 10 MG tablet Take 1 tablet (10 mg total) by mouth every morning. 04/17/22   Deberah Pelton, NP  ferrous sulfate 325 (65 FE) MG tablet Take 1 tablet (325 mg total) by mouth daily. 07/22/21 07/22/22  Elodia Florence., MD  insulin detemir (LEVEMIR FLEXTOUCH) 100 UNIT/ML FlexPen Inject 20 Units into the skin daily as needed (CBG >130).    [provider]  Insulin Pen Needle (BD PEN NEEDLE NANO U/F) 32G X 4 MM MISC 1 pen by Does not apply route daily. 11/11/13   Kyra Leyland, PA-C  isosorbide  mononitrate (IMDUR) 120 MG 24 hr tablet Take 1 tablet (120 mg total) by mouth daily. 04/17/22   Deberah Pelton, NP  metFORMIN (GLUCOPHAGE) 1000 MG tablet Take 1,000 mg by mouth daily. 02/09/21   [provider]  metoprolol succinate (TOPROL-XL) 50 MG 24 hr tablet Take 1 tablet (50 mg total) by mouth daily. 04/17/22   Deberah Pelton, NP  Multiple Vitamin (MULTIVITAMIN WITH MINERALS) TABS Take 1 tablet by mouth every morning.    [provider]  nitroGLYCERIN (NITROSTAT) 0.4 MG SL tablet PLACE 1 TABLET UNDER THE TONGUE EVERY 5 MINUTES FOR 3 DOSES AS NEEDED FOR CHEST PAIN Patient taking differently: Place 0.4 mg under the tongue every 5 (five) minutes as needed for chest pain. 01/31/22   Martin[place], Peter M, MD  prasugrel (EFFIENT) 10 MG TABS tablet Take 1 tablet (10 mg total) by mouth daily. 04/17/22   Deberah Pelton, NP  rosuvastatin (CRESTOR) 40 MG tablet TAKE 1 TABLET (40 MG TOTAL) BY MOUTH DAILY. 08/02/21   [place], Peter M, MD  sitaGLIPtin (JANUVIA) 100 MG tablet Take 100 mg by mouth every morning.    [provider]  traMADol (ULTRAM) 50 MG tablet Take 50 mg by mouth 2 (two) times daily as needed for pain. 02/21/22   [provider]  Allergies    Demerol    Review of Systems   Review of Systems  Cardiovascular:  Positive for chest pain.    Physical Exam Updated Vital Signs BP 114/81 (BP Location: Left Arm)   Pulse 67   Temp 98 F (36.7 C) (Oral)   Resp 17   Ht '5\' 8"'$  (1.727 m)   Wt 83 kg   SpO2 98%   BMI 27.83 kg/m  Physical Exam Vitals and nursing note reviewed.  Constitutional:      Appearance: Normal appearance.  HENT:     Head: Normocephalic and atraumatic.     Right Ear: Tympanic membrane normal.     Left Ear: Tympanic membrane normal.     Nose: Nose normal.     Mouth/Throat:     Mouth: Mucous membranes are moist.  Eyes:     Extraocular Movements: Extraocular movements intact.     Conjunctiva/sclera: Conjunctivae normal.      Pupils: Pupils are equal, round, and reactive to light.  Cardiovascular:     Rate and Rhythm: Normal rate and regular rhythm.  Pulmonary:     Effort: Pulmonary effort is normal.     Breath sounds: Normal breath sounds.  Abdominal:     General: Abdomen is flat. Bowel sounds are normal.     Palpations: Abdomen is soft.  Musculoskeletal:        General: Normal range of motion.     Cervical back: Normal range of motion and neck supple.  Skin:    General: Skin is warm and dry.     Capillary Refill: Capillary refill takes less than 2 seconds.  Neurological:     General: No focal deficit present.     Mental Status: He is alert.  Psychiatric:        Mood and Affect: Mood normal.        Thought Content: Thought content normal.     ED Results / Procedures / Treatments   Labs (all labs ordered are listed, but only abnormal results are displayed) Labs Reviewed  COMPREHENSIVE METABOLIC PANEL - Abnormal; Notable for the following components:      Result Value   Potassium 3.1 (*)    Glucose, Bld 110 (*)    All other components within normal limits  CBC WITH DIFFERENTIAL/PLATELET - Abnormal; Notable for the following components:   Platelets 108 (*)    All other components within normal limits  D-DIMER, QUANTITATIVE  TROPONIN I (HIGH SENSITIVITY)  TROPONIN I (HIGH SENSITIVITY)   Radiology DG Chest 2 View  Result Date: 06/26/2022 CLINICAL DATA:  Chest pain EXAM: CHEST - 2 VIEW COMPARISON:  Radiographs 03/23/2022 FINDINGS: Stable mild cardiomegaly. Sternotomy and CABG. No focal consolidation, pleural effusion, or pneumothorax. No acute osseous abnormality. Cervical spine fusion hardware. IMPRESSION: No acute abnormality.  Cardiomegaly. Electronically Signed   By: Placido Sou M.D.   On: 06/26/2022 03:26    Procedures Procedures   Medications Ordered in ED Medications  potassium chloride (KLOR-CON) packet 40 mEq (40 mEq Oral Given 06/26/22 0428)    ED Course/ Medical Decision  Making/ A&P Clinical Course as of 06/26/22 0612  Wed Jun 26, 2022  0343 D-Dimer, Quant: 0.45 [BS]  4709 Troponin I (High Sensitivity) [BS]  0539 Troponin I (High Sensitivity) [BS]  0540 Troponin I (High Sensitivity) [BS]  0559 Troponin I (High Sensitivity): 11 [BS]    Clinical Course User Index [BS] Toshiko Kemler, Si Gaul, PA  Medical Decision Making Amount and/or Complexity of Data Reviewed Labs: ordered. Decision-making details documented in ED Course. Radiology: ordered.  Risk Prescription drug management.  This patient presents to the ED for concern of chest pain, this involves an extensive number of treatment options, and is a complaint that carries with it a high risk of complications and morbidity.    Co morbidities that complicate the patient evaluation  -HTN -DM2 -Hx of CABG, 2 stents   Additional history obtained:  [facility] External records from outside source obtained and reviewed including cardiology note from 04/2022   Lab Tests:  I Ordered, and personally interpreted labs.  The pertinent results include:  troponins 11 twice, d-dimer <500   Imaging Studies ordered:  I ordered imaging studies including chest xray  I independently visualized and interpreted imaging which showed an unremarkable chest xray I agree with the radiologist interpretation   Cardiac Monitoring: / EKG:  The patient was maintained on a cardiac monitor.  I personally viewed and interpreted the cardiac monitored which showed an underlying rhythm of: NSR w/T wave inversions in leads I, avL , V2-V5.    Consultations Obtained:  I requested consultation with the attending,  and discussed lab and imaging findings as well as pertinent plan - they recommend: repeat trop and then re-eval    Problem List / ED Course / Critical interventions / Medication management  Patient is a 76 year old male, history of CABG, 2 stents, hypertension, diabetes, here for chest pain,  substernal, no radiation, associated with dizziness, nausea.  Alleviated by nitroglycerin.  No reoccurrence of chest pain. I have reviewed the patients home medicines and have made adjustments as needed   Social Determinants of Health:   Test / Admission - Considered:  -Patient is a 76 year old male, history of CABG, 2 stents, hypertension, diabetes, here for chest pain, substernal, no radiation, associated with dizziness, nausea.  Alleviated by nitroglycerin.  Chest x-ray unremarkable, D-dimer less than 500. ADD-RS-->d-dimer <500==>no CTA.  Troponins 11, 11.  Discussed close follow-up with cardiology, versus admission, patient performed well on exertional test, did not have recurrence of chest pain.  Walked several laps without any chest pain.  Discussed with patient, he elected to go home.  He was aware that he needs to follow-up closely with cardiology.  Final Clinical Impression(s) / ED Diagnoses Final diagnoses:  Chest pain, unspecified type    Rx / DC Orders ED Discharge Orders     None         Garv Kuechle, Si Gaul, PA 06/26/22 1610    Quintella Reichert, MD 06/27/22 [phone]

## 2022-06-26 NOTE — ED Triage Notes (Signed)
Pt bib [facility] from home c/o 10/10 central chest pain that woke him from his sleep. Took 1 nitro at home with relief, 324 ASA given by EMS, +cardiac hx, reports symptoms today are different from previous events. Also reports nausea and dizziness. Axo x4  BP 154/104 HR 76 NSR SPO2 95%  CBG 129

## 2022-06-26 NOTE — Discharge Instructions (Addendum)
Follow-up closely with your PCP. If you develop severe chest pain please return to the ED. Use your nitroglycerin as previously prescribed.

## 2022-06-26 NOTE — ED Notes (Signed)
Patient transported to X-ray 
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
## 2022-07-02 ENCOUNTER — Ambulatory Visit: Payer: Medicare Other | Attending: Cardiology | Admitting: Cardiology

## 2022-07-02 ENCOUNTER — Encounter: Payer: Self-pay | Admitting: Cardiology

## 2022-07-02 VITALS — BP 127/82 | HR 65 | Ht 67.5 in | Wt 177.8 lb

## 2022-07-02 DIAGNOSIS — I10 Essential (primary) hypertension: Secondary | ICD-10-CM | POA: Diagnosis not present

## 2022-07-02 DIAGNOSIS — I25118 Atherosclerotic heart disease of native coronary artery with other forms of angina pectoris: Secondary | ICD-10-CM

## 2022-07-02 DIAGNOSIS — E785 Hyperlipidemia, unspecified: Secondary | ICD-10-CM

## 2022-07-02 DIAGNOSIS — Z951 Presence of aortocoronary bypass graft: Secondary | ICD-10-CM | POA: Diagnosis not present

## 2022-07-02 DIAGNOSIS — E1169 Type 2 diabetes mellitus with other specified complication: Secondary | ICD-10-CM

## 2022-10-24 ENCOUNTER — Other Ambulatory Visit: Payer: Self-pay | Admitting: Cardiology

## 2022-10-24 ENCOUNTER — Other Ambulatory Visit (HOSPITAL_BASED_OUTPATIENT_CLINIC_OR_DEPARTMENT_OTHER): Payer: Self-pay | Admitting: General Practice

## 2022-11-16 ENCOUNTER — Emergency Department (HOSPITAL_COMMUNITY): Payer: 59

## 2022-11-16 ENCOUNTER — Inpatient Hospital Stay (HOSPITAL_COMMUNITY): Payer: 59

## 2022-11-16 ENCOUNTER — Other Ambulatory Visit: Payer: Self-pay

## 2022-11-16 ENCOUNTER — Inpatient Hospital Stay (HOSPITAL_COMMUNITY)
Admission: EM | Admit: 2022-11-16 | Discharge: 2022-11-19 | DRG: 061 | Disposition: A | Discharge status: Home / Self Care | Payer: 59 | Attending: Neurology | Admitting: Neurology

## 2022-11-16 DIAGNOSIS — I63111 Cerebral infarction due to embolism of right vertebral artery: Secondary | ICD-10-CM | POA: Diagnosis not present

## 2022-11-16 DIAGNOSIS — I119 Hypertensive heart disease without heart failure: Secondary | ICD-10-CM | POA: Diagnosis present

## 2022-11-16 DIAGNOSIS — T45615A Adverse effect of thrombolytic drugs, initial encounter: Secondary | ICD-10-CM | POA: Diagnosis not present

## 2022-11-16 DIAGNOSIS — I7774 Dissection of vertebral artery: Secondary | ICD-10-CM | POA: Diagnosis present

## 2022-11-16 DIAGNOSIS — F1011 Alcohol abuse, in remission: Secondary | ICD-10-CM | POA: Diagnosis present

## 2022-11-16 DIAGNOSIS — I2510 Atherosclerotic heart disease of native coronary artery without angina pectoris: Secondary | ICD-10-CM | POA: Diagnosis present

## 2022-11-16 DIAGNOSIS — Z923 Personal history of irradiation: Secondary | ICD-10-CM | POA: Diagnosis not present

## 2022-11-16 DIAGNOSIS — C61 Malignant neoplasm of prostate: Secondary | ICD-10-CM | POA: Diagnosis present

## 2022-11-16 DIAGNOSIS — R319 Hematuria, unspecified: Secondary | ICD-10-CM | POA: Diagnosis not present

## 2022-11-16 DIAGNOSIS — Z7984 Long term (current) use of oral hypoglycemic drugs: Secondary | ICD-10-CM

## 2022-11-16 DIAGNOSIS — Z791 Long term (current) use of non-steroidal anti-inflammatories (NSAID): Secondary | ICD-10-CM

## 2022-11-16 DIAGNOSIS — K219 Gastro-esophageal reflux disease without esophagitis: Secondary | ICD-10-CM | POA: Diagnosis present

## 2022-11-16 DIAGNOSIS — Z8042 Family history of malignant neoplasm of prostate: Secondary | ICD-10-CM

## 2022-11-16 DIAGNOSIS — Z885 Allergy status to narcotic agent status: Secondary | ICD-10-CM

## 2022-11-16 DIAGNOSIS — I959 Hypotension, unspecified: Secondary | ICD-10-CM | POA: Diagnosis present

## 2022-11-16 DIAGNOSIS — I639 Cerebral infarction, unspecified: Principal | ICD-10-CM

## 2022-11-16 DIAGNOSIS — Z8249 Family history of ischemic heart disease and other diseases of the circulatory system: Secondary | ICD-10-CM

## 2022-11-16 DIAGNOSIS — F419 Anxiety disorder, unspecified: Secondary | ICD-10-CM | POA: Diagnosis present

## 2022-11-16 DIAGNOSIS — E785 Hyperlipidemia, unspecified: Secondary | ICD-10-CM | POA: Diagnosis present

## 2022-11-16 DIAGNOSIS — E119 Type 2 diabetes mellitus without complications: Secondary | ICD-10-CM | POA: Diagnosis present

## 2022-11-16 DIAGNOSIS — N400 Enlarged prostate without lower urinary tract symptoms: Secondary | ICD-10-CM | POA: Diagnosis present

## 2022-11-16 DIAGNOSIS — M1990 Unspecified osteoarthritis, unspecified site: Secondary | ICD-10-CM | POA: Diagnosis present

## 2022-11-16 DIAGNOSIS — R471 Dysarthria and anarthria: Secondary | ICD-10-CM | POA: Diagnosis present

## 2022-11-16 DIAGNOSIS — Z87891 Personal history of nicotine dependence: Secondary | ICD-10-CM | POA: Diagnosis not present

## 2022-11-16 DIAGNOSIS — R29706 NIHSS score 6: Secondary | ICD-10-CM | POA: Diagnosis present

## 2022-11-16 DIAGNOSIS — Z951 Presence of aortocoronary bypass graft: Secondary | ICD-10-CM | POA: Diagnosis not present

## 2022-11-16 DIAGNOSIS — Z955 Presence of coronary angioplasty implant and graft: Secondary | ICD-10-CM

## 2022-11-16 DIAGNOSIS — Z79899 Other long term (current) drug therapy: Secondary | ICD-10-CM

## 2022-11-16 DIAGNOSIS — Z794 Long term (current) use of insulin: Secondary | ICD-10-CM

## 2022-11-16 DIAGNOSIS — Z801 Family history of malignant neoplasm of trachea, bronchus and lung: Secondary | ICD-10-CM

## 2022-11-16 LAB — CBC
HCT: 35.5 % — ABNORMAL LOW (ref 39.0–52.0)
Hemoglobin: 12.1 g/dL — ABNORMAL LOW (ref 13.0–17.0)
MCH: 29.4 pg (ref 26.0–34.0)
MCHC: 34.1 g/dL (ref 30.0–36.0)
MCV: 86.4 fL (ref 80.0–100.0)
Platelets: 119 10*3/uL — ABNORMAL LOW (ref 150–400)
RBC: 4.11 MIL/uL — ABNORMAL LOW (ref 4.22–5.81)
RDW: 15.5 % (ref 11.5–15.5)
WBC: 4.7 10*3/uL (ref 4.0–10.5)
nRBC: 0 % (ref 0.0–0.2)

## 2022-11-16 LAB — APTT: aPTT: 26 seconds (ref 24–36)

## 2022-11-16 LAB — GLUCOSE, CAPILLARY
Glucose-Capillary: 289 mg/dL — ABNORMAL HIGH (ref 70–99)
Glucose-Capillary: 275 mg/dL — ABNORMAL HIGH (ref 70–99)
Glucose-Capillary: 199 mg/dL — ABNORMAL HIGH (ref 70–99)

## 2022-11-16 LAB — I-STAT CHEM 8, ED
BUN: 13 mg/dL (ref 8–23)
Calcium, Ion: 1.12 mmol/L — ABNORMAL LOW (ref 1.15–1.40)
Chloride: 106 mmol/L (ref 98–111)
Creatinine, Ser: 1.1 mg/dL (ref 0.61–1.24)
Glucose, Bld: 167 mg/dL — ABNORMAL HIGH (ref 70–99)
HCT: 35 % — ABNORMAL LOW (ref 39.0–52.0)
Hemoglobin: 11.9 g/dL — ABNORMAL LOW (ref 13.0–17.0)
Potassium: 3.6 mmol/L (ref 3.5–5.1)
Sodium: 141 mmol/L (ref 135–145)
TCO2: 25 mmol/L (ref 22–32)

## 2022-11-16 LAB — ECHOCARDIOGRAM COMPLETE
AR max vel: 2.01 cm2
AV Area VTI: 2.08 cm2
AV Area mean vel: 2.12 cm2
AV Mean grad: 5 mmHg
AV Peak grad: 11.2 mmHg
Ao pk vel: 1.67 m/s
Area-P 1/2: 2.92 cm2
MV M vel: 3.23 m/s
MV Peak grad: 41.7 mmHg
S' Lateral: 2.3 cm
Weight: 2864 oz

## 2022-11-16 LAB — CBC WITH DIFFERENTIAL/PLATELET
Abs Immature Granulocytes: 0.02 10*3/uL (ref 0.00–0.07)
Basophils Absolute: 0 10*3/uL (ref 0.0–0.1)
Basophils Relative: 0 %
Eosinophils Absolute: 0.1 10*3/uL (ref 0.0–0.5)
Eosinophils Relative: 1 %
HCT: 41.2 % (ref 39.0–52.0)
Hemoglobin: 14.3 g/dL (ref 13.0–17.0)
Immature Granulocytes: 0 %
Lymphocytes Relative: 14 %
Lymphs Abs: 1.2 10*3/uL (ref 0.7–4.0)
MCH: 29.3 pg (ref 26.0–34.0)
MCHC: 34.7 g/dL (ref 30.0–36.0)
MCV: 84.4 fL (ref 80.0–100.0)
Monocytes Absolute: 0.4 10*3/uL (ref 0.1–1.0)
Monocytes Relative: 5 %
Neutro Abs: 6.5 10*3/uL (ref 1.7–7.7)
Neutrophils Relative %: 80 %
Platelets: 152 10*3/uL (ref 150–400)
RBC: 4.88 MIL/uL (ref 4.22–5.81)
RDW: 15.5 % (ref 11.5–15.5)
WBC: 8.1 10*3/uL (ref 4.0–10.5)
nRBC: 0 % (ref 0.0–0.2)

## 2022-11-16 LAB — DIFFERENTIAL
Abs Immature Granulocytes: 0.02 10*3/uL (ref 0.00–0.07)
Basophils Absolute: 0 10*3/uL (ref 0.0–0.1)
Basophils Relative: 0 %
Eosinophils Absolute: 0.2 10*3/uL (ref 0.0–0.5)
Eosinophils Relative: 5 %
Immature Granulocytes: 0 %
Lymphocytes Relative: 30 %
Lymphs Abs: 1.4 10*3/uL (ref 0.7–4.0)
Monocytes Absolute: 0.5 10*3/uL (ref 0.1–1.0)
Monocytes Relative: 10 %
Neutro Abs: 2.6 10*3/uL (ref 1.7–7.7)
Neutrophils Relative %: 55 %

## 2022-11-16 LAB — URINALYSIS, ROUTINE W REFLEX MICROSCOPIC
Bilirubin Urine: NEGATIVE
Glucose, UA: 50 mg/dL — AB
Hgb urine dipstick: NEGATIVE
Ketones, ur: NEGATIVE mg/dL
Leukocytes,Ua: NEGATIVE
Nitrite: NEGATIVE
Protein, ur: NEGATIVE mg/dL
Specific Gravity, Urine: 1.032 — ABNORMAL HIGH (ref 1.005–1.030)
pH: 7 (ref 5.0–8.0)

## 2022-11-16 LAB — RAPID URINE DRUG SCREEN, HOSP PERFORMED
Amphetamines: NOT DETECTED
Barbiturates: NOT DETECTED
Benzodiazepines: NOT DETECTED
Cocaine: NOT DETECTED
Opiates: NOT DETECTED
Tetrahydrocannabinol: NOT DETECTED

## 2022-11-16 LAB — COMPREHENSIVE METABOLIC PANEL
ALT: 13 U/L (ref 0–44)
AST: 17 U/L (ref 15–41)
Albumin: 2.9 g/dL — ABNORMAL LOW (ref 3.5–5.0)
Alkaline Phosphatase: 38 U/L (ref 38–126)
Anion gap: 7 (ref 5–15)
BUN: 12 mg/dL (ref 8–23)
CO2: 24 mmol/L (ref 22–32)
Calcium: 7.8 mg/dL — ABNORMAL LOW (ref 8.9–10.3)
Chloride: 105 mmol/L (ref 98–111)
Creatinine, Ser: 1.2 mg/dL (ref 0.61–1.24)
GFR, Estimated: >60 mL/min (ref >60)
Glucose, Bld: 172 mg/dL — ABNORMAL HIGH (ref 70–99)
Potassium: 3.5 mmol/L (ref 3.5–5.1)
Sodium: 136 mmol/L (ref 135–145)
Total Bilirubin: 0.2 mg/dL — ABNORMAL LOW (ref 0.3–1.2)
Total Protein: 5.4 g/dL — ABNORMAL LOW (ref 6.5–8.1)

## 2022-11-16 LAB — ETHANOL: Alcohol, Ethyl (B): <10 mg/dL (ref <10)

## 2022-11-16 LAB — HEMOGLOBIN A1C
Hgb A1c MFr Bld: 7.9 % — ABNORMAL HIGH (ref 4.8–5.6)
Mean Plasma Glucose: 180.03 mg/dL

## 2022-11-16 LAB — PROTIME-INR
INR: 1.1 (ref 0.8–1.2)
Prothrombin Time: 14.1 seconds (ref 11.4–15.2)

## 2022-11-16 LAB — TROPONIN I (HIGH SENSITIVITY)
Troponin I (High Sensitivity): 6 ng/L (ref <18)
Troponin I (High Sensitivity): 9 ng/L (ref <18)

## 2022-11-16 LAB — MRSA NEXT GEN BY PCR, NASAL: MRSA by PCR Next Gen: NOT DETECTED

## 2022-11-16 MED ORDER — TENECTEPLASE FOR STROKE
0.2500 mg/kg | PACK | Freq: Once | INTRAVENOUS | Status: AC
  Administered 2022-11-16: 20 mg via INTRAVENOUS
  Filled 2022-11-16: qty 10

## 2022-11-16 MED ORDER — INSULIN ASPART 100 UNIT/ML IJ SOLN
0.0000 [IU] | Freq: Three times a day (TID) | INTRAMUSCULAR | Status: DC
  Administered 2022-11-16: 8 [IU] via SUBCUTANEOUS
  Administered 2022-11-17: 5 [IU] via SUBCUTANEOUS
  Administered 2022-11-17: 3 [IU] via SUBCUTANEOUS
  Administered 2022-11-17: 8 [IU] via SUBCUTANEOUS
  Administered 2022-11-18 (×3): 2 [IU] via SUBCUTANEOUS
  Administered 2022-11-19: 3 [IU] via SUBCUTANEOUS

## 2022-11-16 MED ORDER — PANTOPRAZOLE SODIUM 40 MG IV SOLR
40.0000 mg | Freq: Every day | INTRAVENOUS | Status: DC
  Administered 2022-11-16 – 2022-11-18 (×3): 40 mg via INTRAVENOUS
  Filled 2022-11-16 (×3): qty 10

## 2022-11-16 MED ORDER — SENNOSIDES-DOCUSATE SODIUM 8.6-50 MG PO TABS: 1.0000 | ORAL_TABLET | Freq: Every evening | ORAL | Status: DC | PRN

## 2022-11-16 MED ORDER — AMLODIPINE BESYLATE 10 MG PO TABS
10.0000 mg | ORAL_TABLET | Freq: Every morning | ORAL | Status: DC
  Administered 2022-11-16 – 2022-11-19 (×4): 10 mg via ORAL
  Filled 2022-11-16 (×4): qty 1

## 2022-11-16 MED ORDER — IOHEXOL 350 MG/ML SOLN
75.0000 mL | Freq: Once | INTRAVENOUS | Status: AC | PRN
  Administered 2022-11-16: 75 mL via INTRAVENOUS

## 2022-11-16 MED ORDER — SODIUM CHLORIDE 0.9 % IV BOLUS
500.0000 mL | Freq: Once | INTRAVENOUS | Status: AC
  Administered 2022-11-16: 500 mL via INTRAVENOUS

## 2022-11-16 MED ORDER — FENTANYL CITRATE PF 50 MCG/ML IJ SOSY
25.0000 ug | PREFILLED_SYRINGE | Freq: Once | INTRAMUSCULAR | Status: AC
  Administered 2022-11-16: 25 ug via INTRAVENOUS
  Filled 2022-11-16: qty 1

## 2022-11-16 MED ORDER — ROSUVASTATIN CALCIUM 20 MG PO TABS
40.0000 mg | ORAL_TABLET | Freq: Every day | ORAL | Status: DC
  Administered 2022-11-16 – 2022-11-19 (×4): 40 mg via ORAL
  Filled 2022-11-16 (×4): qty 2

## 2022-11-16 MED ORDER — CHLORHEXIDINE GLUCONATE CLOTH 2 % EX PADS
6.0000 | MEDICATED_PAD | Freq: Every day | CUTANEOUS | Status: DC
  Administered 2022-11-16 – 2022-11-19 (×4): 6 via TOPICAL

## 2022-11-16 MED ORDER — SODIUM CHLORIDE 0.9 % IV SOLN: INTRAVENOUS | Status: DC

## 2022-11-16 MED ORDER — METFORMIN HCL 500 MG PO TABS
1000.0000 mg | ORAL_TABLET | Freq: Two times a day (BID) | ORAL | Status: DC
  Administered 2022-11-18 – 2022-11-19 (×2): 1000 mg via ORAL
  Filled 2022-11-16 (×4): qty 2

## 2022-11-16 MED ORDER — ACETAMINOPHEN 325 MG PO TABS
650.0000 mg | ORAL_TABLET | ORAL | Status: DC | PRN
  Administered 2022-11-16 – 2022-11-19 (×8): 650 mg via ORAL
  Filled 2022-11-16 (×9): qty 2

## 2022-11-16 MED ORDER — ORAL CARE MOUTH RINSE: 15.0000 mL | OROMUCOSAL | Status: DC | PRN

## 2022-11-16 MED ORDER — ACETAMINOPHEN 160 MG/5ML PO SOLN: 650.0000 mg | ORAL | Status: DC | PRN

## 2022-11-16 MED ORDER — INSULIN DETEMIR 100 UNIT/ML ~~LOC~~ SOLN
10.0000 [IU] | Freq: Every day | SUBCUTANEOUS | Status: DC
  Administered 2022-11-16 – 2022-11-17 (×2): 10 [IU] via SUBCUTANEOUS
  Filled 2022-11-16 (×2): qty 0.1

## 2022-11-16 MED ORDER — STROKE: EARLY STAGES OF RECOVERY BOOK
Freq: Once | Status: AC
  Filled 2022-11-16: qty 1

## 2022-11-16 MED ORDER — ACETAMINOPHEN 650 MG RE SUPP: 650.0000 mg | RECTAL | Status: DC | PRN

## 2022-11-16 MED ORDER — INSULIN ASPART 100 UNIT/ML IJ SOLN
0.0000 [IU] | Freq: Every day | INTRAMUSCULAR | Status: DC
  Administered 2022-11-17: 2 [IU] via SUBCUTANEOUS

## 2022-11-16 MED ORDER — PERFLUTREN LIPID MICROSPHERE
1.0000 mL | INTRAVENOUS | Status: AC | PRN
  Administered 2022-11-16: 2 mL via INTRAVENOUS

## 2022-11-16 MED ORDER — LABETALOL HCL 5 MG/ML IV SOLN: 5.0000 mg | INTRAVENOUS | Status: DC | PRN

## 2022-11-16 MED ORDER — IOHEXOL 350 MG/ML SOLN
50.0000 mL | Freq: Once | INTRAVENOUS | Status: AC | PRN
  Administered 2022-11-16: 50 mL via INTRAVENOUS

## 2022-11-16 NOTE — Progress Notes (Signed)
OT Cancellation Note  Patient Details Name: Antonio [name] MRN: [id number] DOB: 01-13-1946   Cancelled Treatment:    Reason Eval/Treat Not Completed: Active bedrest order. Will follow for increased activity orders.  [place] Office [phone]    Almon Register 11/16/2022, 7:24 AM

## 2022-11-16 NOTE — Consult Note (Signed)
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 in clinical consultation, greater than 50% of which was counseling/coordinating care for cerebral arteriogram

## 2022-11-16 NOTE — ED Notes (Signed)
Pt c/o right side weak and unable to move upper extrem. and lower extrem.

## 2022-11-16 NOTE — Progress Notes (Signed)
PHARMACIST CODE STROKE RESPONSE  Notified to mix TNK at 0403 by Dr. Curly [place] TNK preparation completed at 0407  TNK dose = 20 mg IV over 5 seconds  Issues/delays encountered (if applicable): n/a  Onnie Boer, PharmD, BCIDP, AAHIVP, CPP Infectious Disease Pharmacist 11/16/2022 4:09 AM

## 2022-11-16 NOTE — Inpatient Diabetes Management (Signed)
Inpatient Diabetes Program Recommendations  AACE/ADA: New Consensus Statement on Inpatient Glycemic Control (2015)  Target Ranges:  Prepandial:   less than 140 mg/dL      Peak postprandial:   less than 180 mg/dL (1-2 hours)      Critically ill patients:  140 - 180 mg/dL   Lab Results  Component Value Date   GLUCAP 275 (H) 11/16/2022   HGBA1C 7.9 (H) 11/16/2022    Review of Glycemic Control  Diabetes history: DM2 Outpatient Diabetes medications: Levemir 20 QD, metformin 1000 mg QD, Januvia 100 QAM Current orders for Inpatient glycemic control: Novolog 0-15 TID with meals and 0-5 HS, metformin 1000 mg BID  HgbA1C - 7.9% CBGs today: 167, 289, 275 mg/dL Eating 100%  Inpatient Diabetes Program Recommendations:    Consider adding 50% of Levemir home dose - 10 units QD  If post-prandials continue > 180, add Novolog 3 units TID with meals if eating > 50%.  Will see pt on 3/5 regarding diabetes control and HgbA1C of 7.9%.  Continue to follow.  Thank you. Lorenda Peck, RD, LDN, [facility] Inpatient Diabetes Coordinator [phone]

## 2022-11-16 NOTE — ED Notes (Signed)
Swallow screen on hold per [facility]

## 2022-11-16 NOTE — Code Documentation (Signed)
Responded to Code Stroke called on pt already in the ED for chest pain. Code Stroke called at 0316 for R sided weakness, [id number]. CBG-167, NIH-6 for drift in all 4 extremities, R sensory deficit, and slurred speech. 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 Plan for VS/NIH q65mx 2 hours, q31m 6 hours, q1h x 16hours, and ICU admission.

## 2022-11-16 NOTE — ED Notes (Signed)
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 time known well: 0130 Neuro Assessment:   Neuro Checks:   Initial (11/16/22 0325)  Has TPA been given? Yes Temp: 97.5 F (36.4 C) (02/03 0306) Temp Source: Oral (02/03 0306) BP: 146/82 (02/03 0600) Pulse Rate: 80 (02/03 0545) If patient is a Neuro Trauma and patient is going to OR before floor call report to [place] nurse: [phone] or [phone]   R Recommendations: See Admitting Provider Note  Report given to:   Additional Notes:

## 2022-11-16 NOTE — ED Notes (Signed)
[phone] patient wife would like a update

## 2022-11-16 NOTE — ED Triage Notes (Signed)
Chest pain starting at 2am. Pt c/o pressure in center of chest that radiates to both shoulders. Was beginning to get into bed when pain started. Pt c/o 7/10 in chest currently. Hx- CABG, MI HTN, DM   Ems vitals-110/62 60p Rr16 97% RA 18 L FA Ems meds 3 nitro and  324 of asa

## 2022-11-16 NOTE — ED Notes (Signed)
MD  Bhagat at bedside

## 2022-11-16 NOTE — Progress Notes (Signed)
PT Cancellation Note  Patient Details Name: Antonio [name] MRN: [id number] DOB: Mar 12, 1946   Cancelled Treatment:    Reason Eval/Treat Not Completed: Active bedrest order. Pt received TNK 2/3 0418.   Lorriane Shire 11/16/2022, 7:29 AM

## 2022-11-16 NOTE — ED Provider Notes (Signed)
[facility] Provider Note   CSN: [id number] Arrival date & time: 11/16/22  2355  An emergency department physician performed an initial assessment on this suspected stroke patient at 0316.  History  No chief complaint on file.   Antonio [name] is a 77 y.o. male.  The history is provided by the patient.  Weakness Severity:  Severe Onset quality:  Sudden Duration: within the past hour. Timing:  Constant Progression:  Unchanged Context: not pinched nerve   Relieved by:  Nothing Worsened by:  Nothing Ineffective treatments:  None tried Associated symptoms: chest pain   Associated symptoms: no abdominal pain and no fever  Frequency: .pmh. Patient with CAD who had chest pain that has now improved but is stating since arrival ha cannot move his R arm or R leg.  No back pain.  Patient is hypotensive. Code stroke called immediately upon exam.     Past Medical History:  Diagnosis Date   Alcohol abuse, in remission    Arthritis    Bleeding per rectum    BPH (benign prostatic hyperplasia)    CAD (coronary artery disease)    a. s/p CABG 1996  b. LHC in 11/2013 w/ patent grafts. c. 12/19/14 re-look cath with patent grafts and good LVF   Erectile dysfunction    GERD (gastroesophageal reflux disease)    History of lower GI bleeding    HLD (hyperlipidemia)    HTN (hypertension)    Prostate cancer ([place])    a. s/p radiation    S/P angioplasty with stent-DES ostial VG to PDA and DES ostial VG to diag. 08/27/19 08/28/2019   Type II diabetes mellitus ([place])         Home Medications Prior to Admission medications   Medication Sig Start Date End Date Taking? Authorizing Provider  acetaminophen (TYLENOL) 650 MG CR tablet Take 650-1,300 mg by mouth every 8 (eight) hours as needed for pain.    [provider]  ALPRAZolam Duanne Moron) 0.25 MG tablet Take 0.25 mg by mouth 2 (two) times daily as needed for anxiety. 03/12/22   [provider]  amLODipine (NORVASC) 10 MG tablet Take 1 tablet (10 mg total) by mouth every morning. 04/17/22   Deberah Pelton, NP  Continuous Blood Gluc Receiver (FREESTYLE LIBRE 2 READER) DEVI See admin instructions. 05/07/22   [provider]  Continuous Blood Gluc Sensor (FREESTYLE LIBRE 2 SENSOR) MISC See admin instructions. 06/03/22   [provider]  ferrous sulfate 325 (65 FE) MG tablet Take 1 tablet (325 mg total) by mouth daily. Patient not taking: Reported on 07/02/2022 07/22/21 07/22/22  Elodia Florence., MD  insulin detemir (LEVEMIR FLEXTOUCH) 100 UNIT/ML FlexPen Inject 20 Units into the skin daily as needed (CBG >130).    [provider]  Insulin Pen Needle (BD PEN NEEDLE NANO U/F) 32G X 4 MM MISC 1 pen by Does not apply route daily. 11/11/13   Kyra Leyland, PA-C  isosorbide mononitrate (IMDUR) 120 MG 24 hr tablet TAKE 1 TABLET (120 MG TOTAL) BY MOUTH DAILY. 10/25/22   Martin[place], Peter M, MD  meloxicam (MOBIC) 7.5 MG tablet Take 7.5 mg by mouth at bedtime. Patient not taking: Reported on 07/02/2022 05/06/22   [provider]  metFORMIN (GLUCOPHAGE) 1000 MG tablet Take 1,000 mg by mouth daily. 02/09/21   [provider]  metoprolol succinate (TOPROL-XL) 50 MG 24 hr tablet Take 1 tablet (50 mg total) by mouth daily. 04/17/22  Deberah Pelton, NP  Multiple Vitamin (MULTIVITAMIN WITH MINERALS) TABS Take 1 tablet by mouth every morning.    [provider]  nitroGLYCERIN (NITROSTAT) 0.4 MG SL tablet PLACE 1 TABLET UNDER THE TONGUE EVERY 5 MINUTES FOR 3 DOSES AS NEEDED FOR CHEST PAIN 10/25/22   Martin[place], Peter M, MD  prasugrel (EFFIENT) 10 MG TABS tablet Take 1 tablet (10 mg total) by mouth daily. 04/17/22   Deberah Pelton, NP  rosuvastatin (CRESTOR) 40 MG tablet TAKE 1 TABLET (40 MG TOTAL) BY MOUTH DAILY. 08/02/21   [place], Peter M, MD  sitaGLIPtin (JANUVIA) 100 MG tablet Take 100 mg by mouth every morning.    [provider]   traMADol (ULTRAM) 50 MG tablet Take 50 mg by mouth 2 (two) times daily as needed for pain. 02/21/22   [provider]      Allergies    Demerol    Review of Systems   Review of Systems  Unable to perform ROS: Acuity of condition  Constitutional:  Negative for fever.  Cardiovascular:  Positive for chest pain.  Gastrointestinal:  Negative for abdominal pain.  Genitourinary:  Frequency: .pmh.  Musculoskeletal:  Positive for neck pain.  Neurological:  Positive for weakness.    Physical Exam Updated Vital Signs BP (!) 84/58 (BP Location: Left Arm)   Pulse 61   Temp (!) 97.5 F (36.4 C) (Oral)   Resp 19   Wt 81.2 kg   SpO2 100%   BMI 27.62 kg/m  Physical Exam Vitals and nursing note reviewed.  Constitutional:      General: He is not in acute distress.    Appearance: Normal appearance. He is well-developed. He is not diaphoretic.  HENT:     Head: Normocephalic and atraumatic.     Nose: Nose normal.  Eyes:     Conjunctiva/sclera: Conjunctivae normal.     Pupils: Pupils are equal, round, and reactive to light.  Cardiovascular:     Rate and Rhythm: Normal rate and regular rhythm.  Pulmonary:     Effort: Pulmonary effort is normal.     Breath sounds: Normal breath sounds. No wheezing or rales.  Abdominal:     General: Bowel sounds are normal.     Palpations: Abdomen is soft.     Tenderness: There is no abdominal tenderness. There is no guarding or rebound.  Musculoskeletal:     Cervical back: Normal range of motion and neck supple.  Skin:    General: Skin is warm and dry.     Capillary Refill: Capillary refill takes less than 2 seconds.  Neurological:     Mental Status: He is alert.     Cranial Nerves: No cranial nerve deficit.     Sensory: Sensory deficit present.     Motor: Weakness present.     Comments: RUE/RLE     EKG Interpretation  Date/Time:  Saturday November 16 2022 03:09:47 EST Ventricular Rate:  58 PR Interval:  184 QRS Duration: 95 QT  Interval:  431 QTC Calculation: 424 R Axis:   36 Text Interpretation: Sinus rhythm Inferior infarct, old Confirmed by Randal Buba, Zari Cly ([id number]) on 11/16/2022 4:32:53 AM        ED Results / Procedures / Treatments   Labs (all labs ordered are listed, but only abnormal results are displayed) Results for orders placed or performed during the hospital encounter of 11/16/22  I-stat chem 8, ED  Result Value Ref Range   Sodium 141 135 - 145 mmol/L  Potassium 3.6 3.5 - 5.1 mmol/L   Chloride 106 98 - 111 mmol/L   BUN 13 8 - 23 mg/dL   Creatinine, Ser 1.10 0.61 - 1.24 mg/dL   Glucose, Bld 167 (H) 70 - 99 mg/dL   Calcium, Ion 1.12 (L) 1.15 - 1.40 mmol/L   TCO2 25 22 - 32 mmol/L   Hemoglobin 11.9 (L) 13.0 - 17.0 g/dL   HCT 35.0 (L) 39.0 - 52.0 %   CT Angio Chest/Abd/Pel for Dissection W and/or Wo Contrast  Result Date: 11/16/2022 CLINICAL DATA:  Acute aortic syndrome suspected.  Chest pain. EXAM: CT ANGIOGRAPHY CHEST, ABDOMEN AND PELVIS TECHNIQUE: Non-contrast CT of the chest was initially obtained. Multidetector CT imaging through the chest, abdomen and pelvis was performed using the standard protocol during bolus administration of intravenous contrast. Multiplanar reconstructed images and MIPs were obtained and reviewed to evaluate the vascular anatomy. RADIATION DOSE REDUCTION: This exam was performed according to the departmental dose-optimization program which includes automated exposure control, adjustment of the mA and/or kV according to patient size and/or use of iterative reconstruction technique. CONTRAST:  42m OMNIPAQUE IOHEXOL 350 MG/ML SOLN, 77mOMNIPAQUE IOHEXOL 350 MG/ML SOLN COMPARISON:  07/13/2021. FINDINGS: CTA CHEST FINDINGS Cardiovascular: The heart is enlarged and there is no pericardial effusion. Multi-vessel coronary artery calcifications are seen. There is atherosclerotic calcification of the aorta without evidence of aneurysm or dissection. The pulmonary trunk is normal in  caliber. Mediastinum/Nodes: No mediastinal, hilar, or axillary lymphadenopathy. The thyroid gland, trachea, and esophagus are within normal limits. There is a small hiatal hernia. Lungs/Pleura: Atelectasis is present bilaterally. No effusion or pneumothorax. Musculoskeletal: Sternotomy wires are noted. Degenerative changes are present in the thoracic spine. No acute osseous abnormality. Review of the MIP images confirms the above findings. CTA ABDOMEN AND PELVIS FINDINGS VASCULAR Aorta: Normal caliber aorta without aneurysm, dissection, vasculitis or significant stenosis. Aortic atherosclerosis. Celiac: Patent without evidence of aneurysm, dissection, vasculitis or significant stenosis. SMA: Patent without evidence of aneurysm, dissection, vasculitis or significant stenosis. Renals: Both renal arteries are patent without evidence of aneurysm, dissection, vasculitis, fibromuscular dysplasia or significant stenosis. IMA: Patent. Inflow: No evidence of aneurysm or dissection. There is atherosclerotic calcification and mural thrombus resulting in approximately 50% stenosis in the proximal external iliac artery on the left. Veins: No obvious venous abnormality within the limitations of this arterial phase study. Review of the MIP images confirms the above findings. NON-VASCULAR Hepatobiliary: No focal liver abnormality is seen. No gallstones, gallbladder wall thickening, or biliary dilatation. Pancreas: Unremarkable. No pancreatic ductal dilatation or surrounding inflammatory changes. Spleen: Normal in size without focal abnormality. Adrenals/Urinary Tract: The adrenal glands are within normal limits. There is a cyst in the mid left kidney. No definite renal calculus. Examination is limited due to excreted contrast at the renal pyramids bilaterally. No hydronephrosis. Small bladder diverticula are present bilaterally. Stomach/Bowel: Stomach is within normal limits. Appendix appears normal. No evidence of bowel wall  thickening, distention, or inflammatory changes. No free air or pneumatosis. There is diffuse colonic diverticulosis without diverticulitis. Lymphatic: No abdominal or pelvic lymphadenopathy. Reproductive: Prostate gland is normal in size. Radiopaque beads are noted in the prostate gland. Other: No abdominopelvic ascites. There is a fat containing inguinal hernia on the left. Musculoskeletal: Degenerative changes in the lumbar spine. No acute osseous abnormality. Review of the MIP images confirms the above findings. IMPRESSION: 1. No evidence of aortic aneurysm or dissection. 2. No acute process in the chest, abdomen, or pelvis. 3. Small hiatal hernia. 4.  Colonic diverticulosis. 5. Bladder diverticula. 6. Cardiomegaly with coronary artery calcifications. Electronically Signed   By: Brett Fairy M.D.   On: 11/16/2022 04:22   CT ANGIO HEAD NECK W WO CM (CODE STROKE)  Result Date: 11/16/2022 CLINICAL DATA:  Stroke suspected EXAM: CT ANGIOGRAPHY HEAD AND NECK TECHNIQUE: Multidetector CT imaging of the head and neck was performed using the standard protocol during bolus administration of intravenous contrast. Multiplanar CT image reconstructions and MIPs were obtained to evaluate the vascular anatomy. Carotid stenosis measurements (when applicable) are obtained utilizing NASCET criteria, using the distal internal carotid diameter as the denominator. RADIATION DOSE REDUCTION: This exam was performed according to the departmental dose-optimization program which includes automated exposure control, adjustment of the mA and/or kV according to patient size and/or use of iterative reconstruction technique. CONTRAST:  79m OMNIPAQUE IOHEXOL 350 MG/ML SOLN COMPARISON:  Head CT from earlier today FINDINGS: CTA NECK FINDINGS Aortic arch: Unremarkable where minimally covered Right carotid system: Mild atheromatous plaque and luminal irregularity at the level of the bifurcation/bulb. No stenosis or dissection Left carotid  system: Mild atheromatous plaque at the bifurcation and proximal ICA. No ulceration or significant stenosis. Vertebral arteries: The right vertebral artery is occluded with reconstitution in the upper neck. Although the occlusion is age indeterminate, the vessel does not appear collapsed and distally there is a straight interface suggesting recent occlusion. Cannot differentiate a vertebral dissection from atheromatous occlusion on this study. There is likely both retrograde flow and there is a muscular collateral the upper V2 segment. Skeleton: No acute finding Other neck: No acute finding. Upper chest: No acute finding Review of the MIP images confirms the above findings CTA HEAD FINDINGS Anterior circulation: Atheromatous calcification along the carotid siphons. No branch occlusion, beading, or aneurysm. Posterior circulation: Faint flow in the atheromatous right V4 segment which may be retrograde. The left vertebral and basilar arteries are widely patent. No branch occlusion, beading, or aneurysm. Venous sinuses: Unremarkable Anatomic variants: None significant Review of the MIP images confirms the above findings These results were called by telephone at the time of interpretation on 11/16/2022 at 4:18 am to provider Dr BCurly [place]ores. IMPRESSION: 1. Recent appearing right V1 and V2 occlusion with less intense right vertebral flow at the vertebrobasilar junction. 2. No visible intracranial branch embolism. 3. Overall mild atherosclerosis in the head and neck. Electronically Signed   By: JJorje GuildM.D.   On: 11/16/2022 04:19   CT HEAD CODE STROKE WO CONTRAST  Result Date: 11/16/2022 CLINICAL DATA:  Code stroke.  Right-sided weakness EXAM: CT HEAD WITHOUT CONTRAST TECHNIQUE: Contiguous axial images were obtained from the base of the skull through the vertex without intravenous contrast. RADIATION DOSE REDUCTION: This exam was performed according to the departmental dose-optimization program which includes  automated exposure control, adjustment of the mA and/or kV according to patient size and/or use of iterative reconstruction technique. COMPARISON:  None Available. FINDINGS: Brain: There is no mass, hemorrhage or extra-axial collection. The size and configuration of the ventricles and extra-axial CSF spaces are normal. The brain parenchyma is normal, without evidence of acute or chronic infarction. Vascular: No abnormal hyperdensity of the major intracranial arteries or dural venous sinuses. No intracranial atherosclerosis. Skull: The visualized skull base, calvarium and extracranial soft tissues are normal. Sinuses/Orbits: No fluid levels or advanced mucosal thickening of the visualized paranasal sinuses. No mastoid or middle ear effusion. The orbits are normal. ASPECTS ([facility]) - Ganglionic level infarction (caudate, lentiform nuclei, internal capsule,  insula, M1-M3 cortex): 7 - Supraganglionic infarction (M4-M6 cortex): 3 Total score (0-10 with 10 being normal): 10 IMPRESSION: 1. No acute intracranial abnormality. 2. ASPECTS is 10. These results were communicated to Dr. Lesleigh Noe at 3:36 am on 11/16/2022 by text page via the [facility] messaging system. Electronically Signed   By: Ulyses Jarred M.D.   On: 11/16/2022 03:36   '  Radiology CT Angio Chest/Abd/Pel for Dissection W and/or Wo Contrast  Result Date: 11/16/2022 CLINICAL DATA:  Acute aortic syndrome suspected.  Chest pain. EXAM: CT ANGIOGRAPHY CHEST, ABDOMEN AND PELVIS TECHNIQUE: Non-contrast CT of the chest was initially obtained. Multidetector CT imaging through the chest, abdomen and pelvis was performed using the standard protocol during bolus administration of intravenous contrast. Multiplanar reconstructed images and MIPs were obtained and reviewed to evaluate the vascular anatomy. RADIATION DOSE REDUCTION: This exam was performed according to the departmental dose-optimization program which includes automated  exposure control, adjustment of the mA and/or kV according to patient size and/or use of iterative reconstruction technique. CONTRAST:  66m OMNIPAQUE IOHEXOL 350 MG/ML SOLN, 716mOMNIPAQUE IOHEXOL 350 MG/ML SOLN COMPARISON:  07/13/2021. FINDINGS: CTA CHEST FINDINGS Cardiovascular: The heart is enlarged and there is no pericardial effusion. Multi-vessel coronary artery calcifications are seen. There is atherosclerotic calcification of the aorta without evidence of aneurysm or dissection. The pulmonary trunk is normal in caliber. Mediastinum/Nodes: No mediastinal, hilar, or axillary lymphadenopathy. The thyroid gland, trachea, and esophagus are within normal limits. There is a small hiatal hernia. Lungs/Pleura: Atelectasis is present bilaterally. No effusion or pneumothorax. Musculoskeletal: Sternotomy wires are noted. Degenerative changes are present in the thoracic spine. No acute osseous abnormality. Review of the MIP images confirms the above findings. CTA ABDOMEN AND PELVIS FINDINGS VASCULAR Aorta: Normal caliber aorta without aneurysm, dissection, vasculitis or significant stenosis. Aortic atherosclerosis. Celiac: Patent without evidence of aneurysm, dissection, vasculitis or significant stenosis. SMA: Patent without evidence of aneurysm, dissection, vasculitis or significant stenosis. Renals: Both renal arteries are patent without evidence of aneurysm, dissection, vasculitis, fibromuscular dysplasia or significant stenosis. IMA: Patent. Inflow: No evidence of aneurysm or dissection. There is atherosclerotic calcification and mural thrombus resulting in approximately 50% stenosis in the proximal external iliac artery on the left. Veins: No obvious venous abnormality within the limitations of this arterial phase study. Review of the MIP images confirms the above findings. NON-VASCULAR Hepatobiliary: No focal liver abnormality is seen. No gallstones, gallbladder wall thickening, or biliary dilatation. Pancreas:  Unremarkable. No pancreatic ductal dilatation or surrounding inflammatory changes. Spleen: Normal in size without focal abnormality. Adrenals/Urinary Tract: The adrenal glands are within normal limits. There is a cyst in the mid left kidney. No definite renal calculus. Examination is limited due to excreted contrast at the renal pyramids bilaterally. No hydronephrosis. Small bladder diverticula are present bilaterally. Stomach/Bowel: Stomach is within normal limits. Appendix appears normal. No evidence of bowel wall thickening, distention, or inflammatory changes. No free air or pneumatosis. There is diffuse colonic diverticulosis without diverticulitis. Lymphatic: No abdominal or pelvic lymphadenopathy. Reproductive: Prostate gland is normal in size. Radiopaque beads are noted in the prostate gland. Other: No abdominopelvic ascites. There is a fat containing inguinal hernia on the left. Musculoskeletal: Degenerative changes in the lumbar spine. No acute osseous abnormality. Review of the MIP images confirms the above findings. IMPRESSION: 1. No evidence of aortic aneurysm or dissection. 2. No acute process in the chest, abdomen, or pelvis. 3. Small hiatal hernia. 4. Colonic diverticulosis. 5. Bladder diverticula. 6. Cardiomegaly with coronary artery calcifications.  Electronically Signed   By: Brett Fairy M.D.   On: 11/16/2022 04:22   CT ANGIO HEAD NECK W WO CM (CODE STROKE)  Result Date: 11/16/2022 CLINICAL DATA:  Stroke suspected EXAM: CT ANGIOGRAPHY HEAD AND NECK TECHNIQUE: Multidetector CT imaging of the head and neck was performed using the standard protocol during bolus administration of intravenous contrast. Multiplanar CT image reconstructions and MIPs were obtained to evaluate the vascular anatomy. Carotid stenosis measurements (when applicable) are obtained utilizing NASCET criteria, using the distal internal carotid diameter as the denominator. RADIATION DOSE REDUCTION: This exam was performed  according to the departmental dose-optimization program which includes automated exposure control, adjustment of the mA and/or kV according to patient size and/or use of iterative reconstruction technique. CONTRAST:  66m OMNIPAQUE IOHEXOL 350 MG/ML SOLN COMPARISON:  Head CT from earlier today FINDINGS: CTA NECK FINDINGS Aortic arch: Unremarkable where minimally covered Right carotid system: Mild atheromatous plaque and luminal irregularity at the level of the bifurcation/bulb. No stenosis or dissection Left carotid system: Mild atheromatous plaque at the bifurcation and proximal ICA. No ulceration or significant stenosis. Vertebral arteries: The right vertebral artery is occluded with reconstitution in the upper neck. Although the occlusion is age indeterminate, the vessel does not appear collapsed and distally there is a straight interface suggesting recent occlusion. Cannot differentiate a vertebral dissection from atheromatous occlusion on this study. There is likely both retrograde flow and there is a muscular collateral the upper V2 segment. Skeleton: No acute finding Other neck: No acute finding. Upper chest: No acute finding Review of the MIP images confirms the above findings CTA HEAD FINDINGS Anterior circulation: Atheromatous calcification along the carotid siphons. No branch occlusion, beading, or aneurysm. Posterior circulation: Faint flow in the atheromatous right V4 segment which may be retrograde. The left vertebral and basilar arteries are widely patent. No branch occlusion, beading, or aneurysm. Venous sinuses: Unremarkable Anatomic variants: None significant Review of the MIP images confirms the above findings These results were called by telephone at the time of interpretation on 11/16/2022 at 4:18 am to provider Dr BCurly [place]. IMPRESSION: 1. Recent appearing right V1 and V2 occlusion with less intense right vertebral flow at the vertebrobasilar junction. 2. No visible intracranial branch embolism. 3.  Overall mild atherosclerosis in the head and neck. Electronically Signed   By: JJorje GuildM.D.   On: 11/16/2022 04:19   CT HEAD CODE STROKE WO CONTRAST  Result Date: 11/16/2022 CLINICAL DATA:  Code stroke.  Right-sided weakness EXAM: CT HEAD WITHOUT CONTRAST TECHNIQUE: Contiguous axial images were obtained from the base of the skull through the vertex without intravenous contrast. RADIATION DOSE REDUCTION: This exam was performed according to the departmental dose-optimization program which includes automated exposure control, adjustment of the mA and/or kV according to patient size and/or use of iterative reconstruction technique. COMPARISON:  None Available. FINDINGS: Brain: There is no mass, hemorrhage or extra-axial collection. The size and configuration of the ventricles and extra-axial CSF spaces are normal. The brain parenchyma is normal, without evidence of acute or chronic infarction. Vascular: No abnormal hyperdensity of the major intracranial arteries or dural venous sinuses. No intracranial atherosclerosis. Skull: The visualized skull base, calvarium and extracranial soft tissues are normal. Sinuses/Orbits: No fluid levels or advanced mucosal thickening of the visualized paranasal sinuses. No mastoid or middle ear effusion. The orbits are normal. ASPECTS ([facility] Score) - Ganglionic level infarction (caudate, lentiform nuclei, internal capsule, insula, M1-M3 cortex): 7 - Supraganglionic infarction (M4-M6 cortex): 3 Total  score (0-10 with 10 being normal): 10 IMPRESSION: 1. No acute intracranial abnormality. 2. ASPECTS is 10. These results were communicated to Dr. Lesleigh Noe at 3:36 am on 11/16/2022 by text page via the [facility] messaging system. Electronically Signed   By: Ulyses Jarred M.D.   On: 11/16/2022 03:36    Procedures Procedures    Medications Ordered in ED Medications  sodium chloride 0.9 % bolus 500 mL (has no administration in time range)  iohexol  (OMNIPAQUE) 350 MG/ML injection 75 mL (75 mLs Intravenous Contrast Given 11/16/22 0400)  iohexol (OMNIPAQUE) 350 MG/ML injection 50 mL (50 mLs Intravenous Contrast Given 11/16/22 0402)  tenecteplase (TNKASE) injection for Stroke 20 mg (20 mg Intravenous Given 11/16/22 0418)    ED Course/ Medical Decision Making/ A&P                             Medical Decision Making Patient with initial complaints of chest pain now with RUE/RLE weakness   Amount and/or Complexity of Data Reviewed External Data Reviewed: labs and notes.    Details: Previous notes and labs  Labs: ordered.    Details: Code stroke labs ordered by me and are pending. Normal sodium 141, normal potassium 3.6 normal creatinine 1.1  Radiology: ordered and independent interpretation performed.    Details: No dissection chest abdomen or pelvis by me on CT A ECG/medicine tests: ordered and independent interpretation performed. Discussion of management or test interpretation with external provider(s): Dr. Curly Shores who came for code stroke and is giving TNK   Risk Prescription drug management. Decision regarding hospitalization. Risk Details: Patient with new onset weakness.  This improved in CT but patient was also dysarthric and was ruled out for dissection and decision made by neurology to give TNK, patient will be admitted to the neurology service   Critical Care Total time providing critical care: 30 minutes (Bedside care, reevaluation.  Patient received TNK )   CRITICAL CARE Performed by: Quyen Cutsforth K Delylah Stanczyk-Rasch Total critical care time: 30 minutes Critical care time was exclusive of separately billable procedures and treating other patients. Critical care was necessary to treat or prevent imminent or life-threatening deterioration. Critical care was time spent personally by me on the following activities: development of treatment plan with patient and/or surrogate as well as nursing, discussions with consultants, evaluation of  patient's response to treatment, examination of patient, obtaining history from patient or surrogate, ordering and performing treatments and interventions, ordering and review of laboratory studies, ordering and review of radiographic studies, pulse oximetry and re-evaluation of patient's condition.  Final Clinical Impression(s) / ED Diagnoses Final diagnoses:  Cerebrovascular accident (CVA), unspecified mechanism ([place])    The patient appears reasonably stabilized for admission considering the current resources, flow, and capabilities available in the ED at this time, and I doubt any other [facility] requiring further screening and/or treatment in the ED prior to admission.   Rx / DC Orders ED Discharge Orders     None         Demisha Nokes, MD 11/16/22 [phone]

## 2022-11-16 NOTE — Progress Notes (Addendum)
STROKE TEAM PROGRESS NOTE   INTERVAL HISTORY No family at the bedside.    Patient s/p TNK for right-sided weakness and slurred speech. Planned Cerebral Arteriogram 2/5.   Patient Aox5, able to give clear history. Right-sided weakness has improved per patient. RUE 4/5 with decreased grip. Decreased sensation noted to right side, patient states this has improved also. Patient does endorse intermittent dizzy spells   Hematuria seen post-TNK. Patient states he has a history of this following radiation for his prostate cancer. He was recommended to the hyperbaric chamber, and states that he has not had a problem since then. Will monitor and check CBC in am.   Vitals:   11/16/22 1300 11/16/22 1330 11/16/22 1400 11/16/22 1430  BP: (!) 140/77 123/82 122/79   Pulse: 76 80 81 73  Resp: 14  15 (!) 22  Temp:      TempSrc:      SpO2: 93% 94% 94% 95%  Weight:       CBC:  Recent Labs  Lab 11/16/22 0402 11/16/22 0415 11/16/22 1159  WBC 4.7  --  8.1  NEUTROABS 2.6  --  6.5  HGB 12.1* 11.9* 14.3  HCT 35.5* 35.0* 41.2  MCV 86.4  --  84.4  PLT 119*  --  664   Basic Metabolic Panel:  Recent Labs  Lab 11/16/22 0402 11/16/22 0415  NA 136 141  K 3.5 3.6  CL 105 106  CO2 24  --   GLUCOSE 172* 167*  BUN 12 13  CREATININE 1.20 1.10  CALCIUM 7.8*  --    Lipid Panel: No results for input(s): "CHOL", "TRIG", "HDL", "CHOLHDL", "VLDL", "LDLCALC" in the last 168 hours. HgbA1c:  Recent Labs  Lab 11/16/22 0634  HGBA1C 7.9*   Urine Drug Screen:  Recent Labs  Lab 11/16/22 0413  LABOPIA NONE DETECTED  COCAINSCRNUR NONE DETECTED  LABBENZ NONE DETECTED  AMPHETMU NONE DETECTED  THCU NONE DETECTED  LABBARB NONE DETECTED    Alcohol Level  Recent Labs  Lab 11/16/22 0415  ETH <10    IMAGING past 24 hours ECHOCARDIOGRAM COMPLETE  Result Date: 11/16/2022    ECHOCARDIOGRAM REPORT   Patient Name:   DAANISH COPES Date of Exam: 11/16/2022 Medical Rec #:  [id number]        Height:       67.5  in Accession #:    [id number]       Weight:       179.0 lb Date of Birth:  [birth year]       BSA:          1.940 m Patient Age:    41 years         BP:           110/71 mmHg Patient Gender: M                HR:           73 bpm. Exam Location:  Inpatient Procedure: 2D Echo, Cardiac Doppler, Color Doppler and Intracardiac            Opacification Agent Indications:    I63.9 Stroke  History:        Patient has prior history of Echocardiogram examinations, most                 recent 07/20/2021. CAD, Prior CABG, Stroke and CKD,                 Signs/Symptoms:Chest Pain; Risk Factors:Former  Smoker,                 Hypertension, Dyslipidemia and Diabetes.  Sonographer:    Wilkie Aye RVT RCS Referring Phys: [id number] [place]  Sonographer Comments: Suboptimal apical window. IMPRESSIONS  1. No apical thrombus with Definity contrast. Left ventricular ejection fraction, by estimation, is 70 to 75%. Left ventricular ejection fraction by PLAX is 74 %. The left ventricle has hyperdynamic function. The left ventricle has no regional wall motion abnormalities. There is mild left ventricular hypertrophy. Left ventricular diastolic parameters are consistent with Grade I diastolic dysfunction (impaired relaxation).  2. Right ventricular systolic function is mildly reduced. The right ventricular size is normal. There is mildly elevated pulmonary artery systolic pressure. The estimated right ventricular systolic pressure is 37.8 mmHg.  3. The mitral valve is abnormal. Mild mitral valve regurgitation.  4. The aortic valve is tricuspid. Aortic valve regurgitation is not visualized.  5. The inferior vena cava is normal in size with <50% respiratory variability, suggesting right atrial pressure of 8 mmHg. Comparison(s): Changes from prior study are noted. 07/20/2021: LVEF 55-60%. FINDINGS  Left Ventricle: No apical thrombus with Definity contrast. Left ventricular ejection fraction, by estimation, is 70 to 75%. Left ventricular  ejection fraction by PLAX is 74 %. The left ventricle has hyperdynamic function. The left ventricle has no regional wall motion abnormalities. The left ventricular internal cavity size was normal in size. There is mild left ventricular hypertrophy. Left ventricular diastolic parameters are consistent with Grade I diastolic dysfunction (impaired relaxation). Indeterminate filling pressures. Right Ventricle: The right ventricular size is normal. No increase in right ventricular wall thickness. Right ventricular systolic function is mildly reduced. There is mildly elevated pulmonary artery systolic pressure. The tricuspid regurgitant velocity  is 2.74 m/s, and with an assumed right atrial pressure of 8 mmHg, the estimated right ventricular systolic pressure is 58.8 mmHg. Left Atrium: Left atrial size was normal in size. Right Atrium: Right atrial size was normal in size. Pericardium: There is no evidence of pericardial effusion. Mitral Valve: The mitral valve is abnormal. There is mild calcification of the mitral valve leaflet(s). Mild mitral annular calcification. Mild mitral valve regurgitation, with eccentric medially directed jet. Tricuspid Valve: The tricuspid valve is grossly normal. Tricuspid valve regurgitation is trivial. Aortic Valve: The aortic valve is tricuspid. Aortic valve regurgitation is not visualized. Aortic valve mean gradient measures 5.0 mmHg. Aortic valve peak gradient measures 11.2 mmHg. Aortic valve area, by VTI measures 2.08 cm. Pulmonic Valve: The pulmonic valve was grossly normal. Pulmonic valve regurgitation is trivial. Aorta: The aortic root and ascending aorta are structurally normal, with no evidence of dilitation. Venous: The inferior vena cava is normal in size with less than 50% respiratory variability, suggesting right atrial pressure of 8 mmHg. IAS/Shunts: No atrial level shunt detected by color flow Doppler.  LEFT VENTRICLE PLAX 2D LV EF:         Left            Diastology                 ventricular     LV e' medial:    6.55 cm/s                ejection        LV E/e' medial:  12.2                fraction by     LV e' lateral:   10.90  cm/s                PLAX is 74      LV E/e' lateral: 7.3                %. LVIDd:         4.00 cm LVIDs:         2.30 cm LV PW:         1.20 cm LV IVS:        1.20 cm LVOT diam:     1.80 cm LV SV:         63 LV SV Index:   33 LVOT Area:     2.54 cm  RIGHT VENTRICLE            IVC RV Basal diam:  3.20 cm    IVC diam: 1.90 cm RV Mid diam:    2.20 cm RV S prime:     9.33 cm/s TAPSE (M-mode): 1.5 cm LEFT ATRIUM             Index        RIGHT ATRIUM           Index LA diam:        3.80 cm 1.96 cm/m   RA Area:     16.10 cm LA Vol (A2C):   53.8 ml 27.73 ml/m  RA Volume:   42.80 ml  22.06 ml/m LA Vol (A4C):   36.4 ml 18.76 ml/m LA Biplane Vol: 44.3 ml 22.84 ml/m  AORTIC VALVE                     PULMONIC VALVE AV Area (Vmax):    2.01 cm      PV Vmax:          1.27 m/s AV Area (Vmean):   2.12 cm      PV Peak grad:     6.4 mmHg AV Area (VTI):     2.08 cm      PR End Diast Vel: 3.79 msec AV Vmax:           167.00 cm/s AV Vmean:          104.000 cm/s AV VTI:            0.303 m AV Peak Grad:      11.2 mmHg AV Mean Grad:      5.0 mmHg LVOT Vmax:         132.00 cm/s LVOT Vmean:        86.800 cm/s LVOT VTI:          0.248 m LVOT/AV VTI ratio: 0.82  AORTA Ao Root diam: 3.20 cm Ao Asc diam:  3.40 cm Ao Arch diam: 2.5 cm MITRAL VALVE               TRICUSPID VALVE MV Area (PHT): 2.92 cm    TR Peak grad:   30.0 mmHg MV Decel Time: 260 msec    TR Vmax:        274.00 cm/s MR Peak grad: 41.7 mmHg MR Mean grad: 26.0 mmHg    SHUNTS MR Vmax:      323.00 cm/s  Systemic VTI:  0.25 m MR Vmean:     239.0 cm/s   Systemic Diam: 1.80 cm MV E velocity: 79.80 cm/s MV A velocity: 81.60 cm/s MV E/A ratio:  0.98 Lyman Bishop MD Electronically signed by Lyman Bishop MD Signature Date/Time: 11/16/2022/12:53:47 PM  Final    CT ANGIO HEAD NECK W WO CM (CODE STROKE)  Result Date:  11/16/2022 CLINICAL DATA:  Worsening pain after T NK EXAM: CT ANGIOGRAPHY HEAD AND NECK TECHNIQUE: Multidetector CT imaging of the head and neck was performed using the standard protocol during bolus administration of intravenous contrast. Multiplanar CT image reconstructions and MIPs were obtained to evaluate the vascular anatomy. Carotid stenosis measurements (when applicable) are obtained utilizing NASCET criteria, using the distal internal carotid diameter as the denominator. RADIATION DOSE REDUCTION: This exam was performed according to the departmental dose-optimization program which includes automated exposure control, adjustment of the mA and/or kV according to patient size and/or use of iterative reconstruction technique. CONTRAST:  65m OMNIPAQUE IOHEXOL 350 MG/ML SOLN COMPARISON:  CTA from approximately 2 hours prior FINDINGS: CT HEAD FINDINGS Brain: No evidence of acute infarction, hemorrhage, hydrocephalus, extra-axial collection or mass lesion/mass effect. 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 Streak artifact limits posterior fossa assessment. Vascular: Not well assessed due to recent intravenous contrast administration Skull: Negative Sinuses/Orbits: Negative Review of the MIP images confirms the above findings CTA NECK FINDINGS Aortic arch: Stable and negative. Right carotid system: Stable atheromatous irregularity and plaque centered at the bifurcation. Left carotid system: Mild atheromatous change at the bifurcation. Vertebral arteries: No proximal subclavian stenosis. Abrupt occlusion at the right V1 segment without vessel collapse. There is less intense enhancement of the downstream vertebral than before with tubular area of non enhancement that could be propagating clot. The distal right V4 segment and right PICA are still enhancing. Diffusely patent subclavian and left vertebral arteries without irregularity/beading. Skeleton: Generalized degeneration. C6-7 ACDF with solid  arthrodesis. Other neck: No acute finding Upper chest: No acute finding Review of the MIP images confirms the above findings CTA HEAD FINDINGS Anterior circulation: Atheromatous calcification along the carotid siphons. No branch occlusion, beading, or aneurysm. Posterior circulation: Poor flow in the right vertebral artery due to disease in the neck with superimposed atheromatous plaque. The right vertebral is still enhancing, as is the right PICA. The left vertebral and basilar arteries are widely patent and smoothly contoured. No branch occlusion or aneurysm. Venous sinuses: Unremarkable Anatomic variants: None significant Review of the MIP images confirms the above findings IMPRESSION: 1. Known right proximal vertebral occlusion with worsened flow in the reconstituted distal segments although the distal right V4 segment and right PICA remain enhancing. The left vertebral and basilar are widely patent with no visible downstream emboli. 2. No intracranial hemorrhage. Electronically Signed   By: JJorje GuildM.D.   On: 11/16/2022 06:03   CT Angio Chest/Abd/Pel for Dissection W and/or Wo Contrast  Result Date: 11/16/2022 CLINICAL DATA:  Acute aortic syndrome suspected.  Chest pain. EXAM: CT ANGIOGRAPHY CHEST, ABDOMEN AND PELVIS TECHNIQUE: Non-contrast CT of the chest was initially obtained. Multidetector CT imaging through the chest, abdomen and pelvis was performed using the standard protocol during bolus administration of intravenous contrast. Multiplanar reconstructed images and MIPs were obtained and reviewed to evaluate the vascular anatomy. RADIATION DOSE REDUCTION: This exam was performed according to the departmental dose-optimization program which includes automated exposure control, adjustment of the mA and/or kV according to patient size and/or use of iterative reconstruction technique. CONTRAST:  534mOMNIPAQUE IOHEXOL 350 MG/ML SOLN, 7582mMNIPAQUE IOHEXOL 350 MG/ML SOLN COMPARISON:  07/13/2021.  FINDINGS: CTA CHEST FINDINGS Cardiovascular: The heart is enlarged and there is no pericardial effusion. Multi-vessel coronary artery calcifications are seen. There is atherosclerotic calcification of the aorta without evidence of  aneurysm or dissection. The pulmonary trunk is normal in caliber. Mediastinum/Nodes: No mediastinal, hilar, or axillary lymphadenopathy. The thyroid gland, trachea, and esophagus are within normal limits. There is a small hiatal hernia. Lungs/Pleura: Atelectasis is present bilaterally. No effusion or pneumothorax. Musculoskeletal: Sternotomy wires are noted. Degenerative changes are present in the thoracic spine. No acute osseous abnormality. Review of the MIP images confirms the above findings. CTA ABDOMEN AND PELVIS FINDINGS VASCULAR Aorta: Normal caliber aorta without aneurysm, dissection, vasculitis or significant stenosis. Aortic atherosclerosis. Celiac: Patent without evidence of aneurysm, dissection, vasculitis or significant stenosis. SMA: Patent without evidence of aneurysm, dissection, vasculitis or significant stenosis. Renals: Both renal arteries are patent without evidence of aneurysm, dissection, vasculitis, fibromuscular dysplasia or significant stenosis. IMA: Patent. Inflow: No evidence of aneurysm or dissection. There is atherosclerotic calcification and mural thrombus resulting in approximately 50% stenosis in the proximal external iliac artery on the left. Veins: No obvious venous abnormality within the limitations of this arterial phase study. Review of the MIP images confirms the above findings. NON-VASCULAR Hepatobiliary: No focal liver abnormality is seen. No gallstones, gallbladder wall thickening, or biliary dilatation. Pancreas: Unremarkable. No pancreatic ductal dilatation or surrounding inflammatory changes. Spleen: Normal in size without focal abnormality. Adrenals/Urinary Tract: The adrenal glands are within normal limits. There is a cyst in the mid left  kidney. No definite renal calculus. Examination is limited due to excreted contrast at the renal pyramids bilaterally. No hydronephrosis. Small bladder diverticula are present bilaterally. Stomach/Bowel: Stomach is within normal limits. Appendix appears normal. No evidence of bowel wall thickening, distention, or inflammatory changes. No free air or pneumatosis. There is diffuse colonic diverticulosis without diverticulitis. Lymphatic: No abdominal or pelvic lymphadenopathy. Reproductive: Prostate gland is normal in size. Radiopaque beads are noted in the prostate gland. Other: No abdominopelvic ascites. There is a fat containing inguinal hernia on the left. Musculoskeletal: Degenerative changes in the lumbar spine. No acute osseous abnormality. Review of the MIP images confirms the above findings. IMPRESSION: 1. No evidence of aortic aneurysm or dissection. 2. No acute process in the chest, abdomen, or pelvis. 3. Small hiatal hernia. 4. Colonic diverticulosis. 5. Bladder diverticula. 6. Cardiomegaly with coronary artery calcifications. Electronically Signed   By: Brett Fairy M.D.   On: 11/16/2022 04:22   CT ANGIO HEAD NECK W WO CM (CODE STROKE)  Result Date: 11/16/2022 CLINICAL DATA:  Stroke suspected EXAM: CT ANGIOGRAPHY HEAD AND NECK TECHNIQUE: Multidetector CT imaging of the head and neck was performed using the standard protocol during bolus administration of intravenous contrast. Multiplanar CT image reconstructions and MIPs were obtained to evaluate the vascular anatomy. Carotid stenosis measurements (when applicable) are obtained utilizing NASCET criteria, using the distal internal carotid diameter as the denominator. RADIATION DOSE REDUCTION: This exam was performed according to the departmental dose-optimization program which includes automated exposure control, adjustment of the mA and/or kV according to patient size and/or use of iterative reconstruction technique. CONTRAST:  33m OMNIPAQUE IOHEXOL  350 MG/ML SOLN COMPARISON:  Head CT from earlier today FINDINGS: CTA NECK FINDINGS Aortic arch: Unremarkable where minimally covered Right carotid system: Mild atheromatous plaque and luminal irregularity at the level of the bifurcation/bulb. No stenosis or dissection Left carotid system: Mild atheromatous plaque at the bifurcation and proximal ICA. No ulceration or significant stenosis. Vertebral arteries: The right vertebral artery is occluded with reconstitution in the upper neck. Although the occlusion is age indeterminate, the vessel does not appear collapsed and distally there is a straight interface suggesting recent occlusion.  Cannot differentiate a vertebral dissection from atheromatous occlusion on this study. There is likely both retrograde flow and there is a muscular collateral the upper V2 segment. Skeleton: No acute finding Other neck: No acute finding. Upper chest: No acute finding Review of the MIP images confirms the above findings CTA HEAD FINDINGS Anterior circulation: Atheromatous calcification along the carotid siphons. No branch occlusion, beading, or aneurysm. Posterior circulation: Faint flow in the atheromatous right V4 segment which may be retrograde. The left vertebral and basilar arteries are widely patent. No branch occlusion, beading, or aneurysm. Venous sinuses: Unremarkable Anatomic variants: None significant Review of the MIP images confirms the above findings These results were called by telephone at the time of interpretation on 11/16/2022 at 4:18 am to provider Dr Curly [place],. IMPRESSION: 1. Recent appearing right V1 and V2 occlusion with less intense right vertebral flow at the vertebrobasilar junction. 2. No visible intracranial branch embolism. 3. Overall mild atherosclerosis in the head and neck. Electronically Signed   By: Jorje Guild M.D.   On: 11/16/2022 04:19   CT HEAD CODE STROKE WO CONTRAST  Result Date: 11/16/2022 CLINICAL DATA:  Code stroke.  Right-sided weakness  EXAM: CT HEAD WITHOUT CONTRAST TECHNIQUE: Contiguous axial images were obtained from the base of the skull through the vertex without intravenous contrast. RADIATION DOSE REDUCTION: This exam was performed according to the departmental dose-optimization program which includes automated exposure control, adjustment of the mA and/or kV according to patient size and/or use of iterative reconstruction technique. COMPARISON:  None Available. FINDINGS: Brain: There is no mass, hemorrhage or extra-axial collection. The size and configuration of the ventricles and extra-axial CSF spaces are normal. The brain parenchyma is normal, without evidence of acute or chronic infarction. Vascular: No abnormal hyperdensity of the major intracranial arteries or dural venous sinuses. No intracranial atherosclerosis. Skull: The visualized skull base, calvarium and extracranial soft tissues are normal. Sinuses/Orbits: No fluid levels or advanced mucosal thickening of the visualized paranasal sinuses. No mastoid or middle ear effusion. The orbits are normal. ASPECTS [facility] Stroke Program Early CT Score) - Ganglionic level infarction (caudate, lentiform nuclei, internal capsule, insula, M1-M3 cortex): 7 - Supraganglionic infarction (M4-M6 cortex): 3 Total score (0-10 with 10 being normal): 10 IMPRESSION: 1. No acute intracranial abnormality. 2. ASPECTS is 10. These results were communicated to Dr. Lesleigh Noe at 3:36 am on 11/16/2022 by text page via the [facility] messaging system. Electronically Signed   By: Ulyses Jarred M.D.   On: 11/16/2022 03:36    PHYSICAL EXAM Constitutional: Appears well-developed and well-nourished.  Psych: Affect appropriate to situation, calm and cooperative  Eyes: No scleral injection HENT: No oropharyngeal obstruction.  Dentures in place.   MSK: no joint deformities.  Cardiovascular: Normal rate and regular rhythm. Perfusing extremities well Respiratory: Effort normal, non-labored breathing GI: Soft.   No distension. There is no tenderness.  After TNK he was noted to have initially clear urine followed by hematuria, however abdomen remains soft nondistended and nontender Skin: Warm dry and intact visible skin. Diabetic ulcers LLE.    Neuro: Mental Status: Patient is awake, alert, oriented to person, place, month, year, and situation. Patient is able to give a clear and coherent history. No signs of aphasia or neglect Cranial Nerves: II: Visual Fields are full. Pupils are equal, round, and reactive to light.   III,IV, VI: EOMI without ptosis or diploplia.  V: Facial sensation is mildly reduced on the right side VII: Facial movement is symmetric.  VIII: hearing is intact  to voice X: Uvula elevates symmetrically XI: Shoulder shrug is symmetric. XII: tongue is midline  Motor: Tone is normal. Bulk is normal.  4/5 RUE. 5/5 other extremities.  Sensory: Sensation is diminished in the right arm and leg in addition to the face. Patient states this has improved since yesterday. Coordination FNF and HKS are symmetric bilaterally  Gait:  Deferred   ASSESSMENT/PLAN Mr.  BLAND RUDZINSKI is a 77 y.o. male with a PMHx of hypertension, hyperlipidemia, type 2 diabetes, coronary artery disease s/p CABG, remote, prostate cancer s/p radiation, remote GI/rectal bleed   He reports he had been in his usual state of health, went to bed around 7:30 PM, woke up at 1 AM, used the bathroom and ambulated normally, after which he was again in bed watching TV when he had sudden onset chest pain starting at about 2:45 AM for which he activated EMS. Generally weak and noted he was staggering and having trouble walking by EMS.    On arrival to the ED he had sudden onset of right sided weakness that was quite severe but rapidly improving  He was also noted to be initially hypotensive to 70/40 on ED arrival, on multiple checks for at least 5 minutes.  However his blood pressure was normotensive with systolic blood  pressures 110-130 within minutes of this.  Due to features of his history concerning for dissection, CTA head and neck in addition to CTA chest abdomen and pelvis were obtained to exclude dissection prior to TNK treatment   LKW: 1:30 AM Thrombolytic given?: Yes. 3016             Some delay to get CTA head/neck/chest first and confirm no intracranial or aortic dissection             Checklist of contraindications was reviewed and negative. Risks, benefits and alternatives were discussed  Premorbid modified rankin scale:      0 - No symptoms   Right vertebral dissection vs Embolic event s/p TNK  Code Stroke CT head:  No acute abnormality. ASPECTS 10.    CTA head & neck 0335 11/17/22 Recent appearing right V1 and V2 occlusion with less intense right vertebral flow at the vertebrobasilar junction. No visible intracranial branch embolism. Overall mild atherosclerosis in the head and neck. Repeat CTA 0535 11/16/22 Known right proximal vertebral occlusion with worsened flow in the reconstituted distal segments although the distal right V4 segment and right PICA remain enhancing. The left vertebral and basilar are widely patent with no visible downstream emboli. No intracranial hemorrhage.: CTA Chest/Ab/Pelvis: No evidence of aortic aneurysm or dissection. No acute process in the chest, abdomen, or pelvis. Small hiatal hernia. Colonic diverticulosis. Bladder diverticula. Cardiomegaly with coronary artery calcifications  MRI  pending  2D Echo: LVEF 70-75%, Mild LVH, Grade I diastolic dysfunction.  Mildly reduced RVSF, Mildly elevated PASP, Mild MVR  LDL 59 HgbA1c 7.9 VTE prophylaxis - SCDs    Diet   Diet Carb Modified Fluid consistency: Thin; Room service appropriate? Yes   Diet NPO time specified Except for: Sips with Meds   No antithrombotic prior to admission, now on No antithrombotic for 24hours post-TNK.  Therapy recommendations:  pending Disposition:   pending  Hypertension Home meds:  norvasc, TOPROl-XL. Norvasc resumed.  Post TNK for 24  hours < 180/105; as needed labetalol ordered  Long-term BP goal normotensive  Hyperlipidemia Home meds:  crestor '40mg'$ , resumed in hospital LDL 59, goal < 70 Continue statin at discharge  Diabetes type  II Uncontrolled Home meds:  metformin, insulin HgbA1c 7.9, goal < 7.0 CBGs Recent Labs    11/16/22 1236  GLUCAP 289*    [facility] Diabetes educator consulted  Other Stroke Risk Factors Advanced Age >/= 80  Former Cigarette smoker ETOH use, alcohol level <10, advised to drink no more than 1-2 drink(s) a day Coronary artery disease  Other Active Problems Hematuria Known history, per patient Will monitor, recheck CBC in AM  Hospital day # 0   Pt seen by Neuro NP/APP and later by MD. Note/plan to be edited by MD as needed.    Otelia Santee, DNP, AGACNP-BC [facility] Please use AMION for pager and EPIC for messaging  ATTENDING ATTESTATION:  77 year old hypertension hyperlipidemia type 2 diabetes coronary disease status post CABG history of prostate cancer s/p radiation and remote history of GI bleed.  He does have hematuria in the past several months being followed by urology outpatient felt to be secondary to prostate cancer.  Right vertebral possible dissection on scan.  IR consulted they plan on doing angiogram on Monday.  Sliding scale insulin added today.  Crestor and metformin 100 twice daily started.  DM consult. Levemir started 10units qhs. Currently he is stable.  H&H this afternoon is stable.  Continue to monitor hematuria for now no need for urology consult at the moment.  His MRI has been moved up to 11 PM and if negative will consider possible heparin drip given the vertebral dissection and will sign out to the overnight team.  Dr. Reeves Forth evaluated pt independently, reviewed imaging, chart, labs. Discussed and formulated plan with the Resident/APP. Changes were made to  the note where appropriate. Please see APP/resident note above for details.      This patient is critically ill due to respiratory distress, stroke s/p tPA and at significant risk of neurological worsening, death form heart failure, respiratory failure, recurrent stroke, bleeding from [facility], seizure, sepsis. This patient's care requires constant monitoring of vital signs, hemodynamics, respiratory and cardiac monitoring, review of multiple databases, neurological assessment, discussion with family, other specialists and medical decision making of high complexity. I spent 35 minutes of neurocritical care time in the care of this patient.   Antawn Sison,MD    To contact Stroke Continuity provider, please refer to [email or web address] After hours, contact General Neurology
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
## 2022-11-17 DIAGNOSIS — I63111 Cerebral infarction due to embolism of right vertebral artery: Secondary | ICD-10-CM | POA: Diagnosis not present

## 2022-11-17 LAB — CBC WITH DIFFERENTIAL/PLATELET
Abs Immature Granulocytes: 0.03 10*3/uL (ref 0.00–0.07)
Basophils Absolute: 0 10*3/uL (ref 0.0–0.1)
Basophils Relative: 0 %
Eosinophils Absolute: 0.3 10*3/uL (ref 0.0–0.5)
Eosinophils Relative: 4 %
HCT: 45.3 % (ref 39.0–52.0)
Hemoglobin: 15.3 g/dL (ref 13.0–17.0)
Immature Granulocytes: 0 %
Lymphocytes Relative: 29 %
Lymphs Abs: 2.1 10*3/uL (ref 0.7–4.0)
MCH: 29.5 pg (ref 26.0–34.0)
MCHC: 33.8 g/dL (ref 30.0–36.0)
MCV: 87.3 fL (ref 80.0–100.0)
Monocytes Absolute: 0.5 10*3/uL (ref 0.1–1.0)
Monocytes Relative: 7 %
Neutro Abs: 4.1 10*3/uL (ref 1.7–7.7)
Neutrophils Relative %: 60 %
Platelets: 147 10*3/uL — ABNORMAL LOW (ref 150–400)
RBC: 5.19 MIL/uL (ref 4.22–5.81)
RDW: 15.8 % — ABNORMAL HIGH (ref 11.5–15.5)
WBC: 7.1 10*3/uL (ref 4.0–10.5)
nRBC: 0 % (ref 0.0–0.2)

## 2022-11-17 LAB — BASIC METABOLIC PANEL
Anion gap: 10 (ref 5–15)
BUN: 10 mg/dL (ref 8–23)
CO2: 25 mmol/L (ref 22–32)
Calcium: 9 mg/dL (ref 8.9–10.3)
Chloride: 105 mmol/L (ref 98–111)
Creatinine, Ser: 1.15 mg/dL (ref 0.61–1.24)
GFR, Estimated: >60 mL/min (ref >60)
Glucose, Bld: 157 mg/dL — ABNORMAL HIGH (ref 70–99)
Potassium: 3.5 mmol/L (ref 3.5–5.1)
Sodium: 140 mmol/L (ref 135–145)

## 2022-11-17 LAB — GLUCOSE, CAPILLARY
Glucose-Capillary: 181 mg/dL — ABNORMAL HIGH (ref 70–99)
Glucose-Capillary: 260 mg/dL — ABNORMAL HIGH (ref 70–99)
Glucose-Capillary: 175 mg/dL — ABNORMAL HIGH (ref 70–99)
Glucose-Capillary: 160 mg/dL — ABNORMAL HIGH (ref 70–99)
Glucose-Capillary: 223 mg/dL — ABNORMAL HIGH (ref 70–99)

## 2022-11-17 LAB — HEPARIN LEVEL (UNFRACTIONATED)
Heparin Unfractionated: 0.37 IU/mL (ref 0.30–0.70)
Heparin Unfractionated: 0.45 IU/mL (ref 0.30–0.70)

## 2022-11-17 LAB — LIPID PANEL
Cholesterol: 133 mg/dL (ref 0–200)
HDL: 41 mg/dL (ref >40)
LDL Cholesterol: 77 mg/dL (ref 0–99)
Total CHOL/HDL Ratio: 3.2 RATIO
Triglycerides: 73 mg/dL (ref <150)
VLDL: 15 mg/dL (ref 0–40)

## 2022-11-17 MED ORDER — HEPARIN (PORCINE) 25000 UT/250ML-% IV SOLN
1100.0000 [IU]/h | INTRAVENOUS | Status: DC
  Administered 2022-11-17 – 2022-11-18 (×2): 1100 [IU]/h via INTRAVENOUS
  Filled 2022-11-17 (×2): qty 250

## 2022-11-17 MED ORDER — INSULIN DETEMIR 100 UNIT/ML ~~LOC~~ SOLN
10.0000 [IU] | Freq: Two times a day (BID) | SUBCUTANEOUS | Status: DC
  Administered 2022-11-17 – 2022-11-18 (×2): 10 [IU] via SUBCUTANEOUS
  Filled 2022-11-17 (×3): qty 0.1

## 2022-11-17 MED ORDER — SIMETHICONE 40 MG/0.6ML PO SUSP
40.0000 mg | Freq: Four times a day (QID) | ORAL | Status: DC | PRN
  Administered 2022-11-17: 40 mg via ORAL
  Filled 2022-11-17 (×2): qty 0.6

## 2022-11-17 NOTE — Progress Notes (Addendum)
STROKE TEAM PROGRESS NOTE   INTERVAL HISTORY No family at the bedside.    Doing well this morning.  MRI was completed last night stroke overall was small the risk of hemorrhagic transformation is low.  Placed on heparin drip.  Doing well no further hematuria.  H&H is stable and actually improved today.  Vitals:   11/17/22 0800 11/17/22 0900 11/17/22 1000 11/17/22 1100  BP: 139/82 (!) 132/99 139/88 (!) 124/91  Pulse: 65 64 65 60  Resp: '15 17 19 15  '$ Temp: 97.6 F (36.4 C)     TempSrc: Oral     SpO2: 95% 96% 95% 95%  Weight:       CBC:  Recent Labs  Lab 11/16/22 1159 11/17/22 0348  WBC 8.1 7.1  NEUTROABS 6.5 4.1  HGB 14.3 15.3  HCT 41.2 45.3  MCV 84.4 87.3  PLT 152 147*    Basic Metabolic Panel:  Recent Labs  Lab 11/16/22 0402 11/16/22 0415 11/17/22 0348  NA 136 141 140  K 3.5 3.6 3.5  CL 105 106 105  CO2 24  --  25  GLUCOSE 172* 167* 157*  BUN '12 13 10  '$ CREATININE 1.20 1.10 1.15  CALCIUM 7.8*  --  9.0    Lipid Panel:  Recent Labs  Lab 11/17/22 0348  CHOL 133  TRIG 73  HDL 41  CHOLHDL 3.2  VLDL 15  LDLCALC 77   HgbA1c:  Recent Labs  Lab 11/16/22 0634  HGBA1C 7.9*    Urine Drug Screen:  Recent Labs  Lab 11/16/22 0413  LABOPIA NONE DETECTED  COCAINSCRNUR NONE DETECTED  LABBENZ NONE DETECTED  AMPHETMU NONE DETECTED  THCU NONE DETECTED  LABBARB NONE DETECTED     Alcohol Level  Recent Labs  Lab 11/16/22 0415  ETH <10     IMAGING past 24 hours MR BRAIN WO CONTRAST  Result Date: 11/17/2022 CLINICAL DATA:  Stroke follow-up EXAM: MRI HEAD WITHOUT CONTRAST TECHNIQUE: Multiplanar, multiecho pulse sequences of the brain and surrounding structures were obtained without intravenous contrast. COMPARISON:  None Available. FINDINGS: Brain: Punctate ischemic focus in the paramedian right cerebellar hemisphere. No other diffusion abnormality. No chronic microhemorrhage or siderosis. There is multifocal hyperintense T2-weighted signal within the white  matter. Parenchymal volume and CSF spaces are normal. The midline structures are normal. Vascular: Normal flow voids. Skull and upper cervical spine: Normal marrow signal. Sinuses/Orbits: Negative. Other: None. IMPRESSION: 1. Punctate ischemic focus in the paramedian right cerebellar hemisphere. No hemorrhage or mass effect. 2. Findings of chronic small vessel ischemia. Electronically Signed   By: Ulyses Jarred M.D.   On: 11/17/2022 00:21    PHYSICAL EXAM Constitutional: Appears well-developed and well-nourished.  Psych: Affect appropriate to situation, calm and cooperative  Eyes: No scleral injection HENT: No oropharyngeal obstruction.  Dentures in place.   MSK: no joint deformities.  Cardiovascular: Normal rate and regular rhythm. Perfusing extremities well Respiratory: Effort normal, non-labored breathing GI: Soft.  No distension. There is no tenderness.  After TNK he was noted to have initially clear urine followed by hematuria, however abdomen remains soft nondistended and nontender Skin: Warm dry and intact visible skin. Diabetic ulcers LLE.    Neuro: Mental Status: Patient is awake, alert, oriented to person, place, month, year, and situation. Patient is able to give a clear and coherent history. No signs of aphasia or neglect Cranial Nerves: II: Visual Fields are full. Pupils are equal, round, and reactive to light.   III,IV, VI: EOMI without ptosis or  diploplia.  V: Facial sensation is mildly reduced on the right side VII: Facial movement is symmetric.  VIII: hearing is intact to voice X: Uvula elevates symmetrically XI: Shoulder shrug is symmetric. XII: tongue is midline  Motor: Tone is normal. Bulk is normal.  5-/5 RUE. 5/5 other extremities.  Sensory: Normal sensation b/l to light touch. Coordination FNF and HKS are symmetric bilaterally  Gait:  Deferred   ASSESSMENT/PLAN Mr.  Antonio [name] is a 77 y.o. male with a PMHx of hypertension, hyperlipidemia, type 2  diabetes, coronary artery disease s/p CABG, remote, prostate cancer s/p radiation, remote GI/rectal bleed   He reports he had been in his usual state of health, went to bed around 7:30 PM, woke up at 1 AM, used the bathroom and ambulated normally, after which he was again in bed watching TV when he had sudden onset chest pain starting at about 2:45 AM for which he activated EMS. Generally weak and noted he was staggering and having trouble walking by EMS.    On arrival to the ED he had sudden onset of right sided weakness that was quite severe but rapidly improving  He was also noted to be initially hypotensive to 70/40 on ED arrival, on multiple checks for at least 5 minutes.  However his blood pressure was normotensive with systolic blood pressures 562-130 within minutes of this.  Due to features of his history concerning for dissection, CTA head and neck in addition to CTA chest abdomen and pelvis were obtained to exclude dissection prior to TNK treatment   LKW: 1:30 AM Thrombolytic given?: Yes. [id number]             Some delay to get CTA head/neck/chest first and confirm no intracranial or aortic dissection             Checklist of contraindications was reviewed and negative. Risks, benefits and alternatives were discussed  Premorbid modified rankin scale:      0 - No symptoms   Right vertebral dissection vs Embolic event s/p TNK  Code Stroke CT head:  No acute abnormality. ASPECTS 10.    CTA head & neck 0335 11/17/22 Recent appearing right V1 and V2 occlusion with less intense right vertebral flow at the vertebrobasilar junction. No visible intracranial branch embolism. Overall mild atherosclerosis in the head and neck. Repeat CTA 0535 11/16/22 Known right proximal vertebral occlusion with worsened flow in the reconstituted distal segments although the distal right V4 segment and right PICA remain enhancing. The left vertebral and basilar are widely patent with no visible downstream  emboli. No intracranial hemorrhage.: CTA Chest/Ab/Pelvis: No evidence of aortic aneurysm or dissection. No acute process in the chest, abdomen, or pelvis. Small hiatal hernia. Colonic diverticulosis. Bladder diverticula. Cardiomegaly with coronary artery calcifications  MRI small right cerebellar CVA.  2D Echo: LVEF 70-75%, Mild LVH, Grade I diastolic dysfunction.  Mildly reduced RVSF, Mildly elevated PASP, Mild MVR  LDL 59 HgbA1c 7.9 VTE prophylaxis - SCDs    Diet   Diet Carb Modified Fluid consistency: Thin; Room service appropriate? Yes   Diet NPO time specified Except for: Sips with Meds   No antithrombotic prior to admission, now on heparin ggt. Therapy recommendations:  out pt. Disposition:  pending  Hypertension Home meds:  norvasc, TOPROl-XL. Norvasc resumed.  Post TNK for 24  hours < 180/105; as needed labetalol ordered  Long-term BP goal normotensive  Hyperlipidemia Home meds:  crestor '40mg'$ , resumed in hospital LDL 59, goal < 70 Continue  statin at discharge  Diabetes type II Uncontrolled Home meds:  metformin, insulin HgbA1c 7.9, goal < 7.0 CBGs Recent Labs    11/16/22 2103 11/17/22 0632 11/17/22 0851  GLUCAP 199* 181* 260*     SSI. Increase levemir to 10units BID Diabetes educator consulted  Other Stroke Risk Factors Advanced Age >/= 62  Former Cigarette smoker ETOH use, alcohol level <10, advised to drink no more than 1-2 drink(s) a day Coronary artery disease  Other Active Problems Hematuria Known history, per patient. Stable. Will monitor, recheck CBC in AM  Hospital day # 24  77 year old hypertension hyperlipidemia type 2 diabetes coronary disease status post CABG history of prostate cancer s/p radiation and remote history of GI bleed.  He does have hematuria in the past several months being followed by urology outpatient felt to be secondary to prostate cancer.  Right vertebral possible dissection on scan.  Started heparin drip last  night.  No issues this morning.  No hematuria.  H&H has actually improved.  Sugars still elevated will adjust insulin dose today.  Appreciate diabetic consult recommendations.  Will continue in the ICU as he has a planned angiogram tomorrow   This patient is critically ill due to respiratory distress, stroke s/p tPA and at significant risk of neurological worsening, death form heart failure, respiratory failure, recurrent stroke, bleeding from [facility], seizure, sepsis. This patient's care requires constant monitoring of vital signs, hemodynamics, respiratory and cardiac monitoring, review of multiple databases, neurological assessment, discussion with family, other specialists and medical decision making of high complexity. I spent 35 minutes of neurocritical care time in the care of this patient.   Mazin Emma,MD    To contact Stroke Continuity provider, please refer to [email or web address] After hours, contact General Neurology

## 2022-11-17 NOTE — Evaluation (Signed)
Physical Therapy Evaluation Patient Details Name: Antonio [name] MRN: [id number] DOB: 12/18/1945 Today's Date: 11/17/2022  History of Present Illness  Antonio [name] is a 77 y.o. male presenting to hospital with chest pain, right sided weakness. 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 PMHx of hypertension, hyperlipidemia, type 2 diabetes, coronary artery disease s/p CABG, remote, prostate cancer s/p radiation, remote GI/rectal bleed   Clinical Impression  Pt admitted with above. With exception of upper back pain pt is functioning near baseline. Pt functioning at supervision level with minimal R LE weakness and no LOB during ambulation.  Pt is scheduled for an arteriogram Monday. Acute PT to follow up after procedure to ensure no further deficits.     Recommendations for follow up therapy are one component of a multi-disciplinary discharge planning process, led by the attending physician.  Recommendations may be updated based on patient status, additional functional criteria and insurance authorization.  Follow Up Recommendations Outpatient PT (may progress and not need it)      Assistance Recommended at Discharge Intermittent Supervision/Assistance  Patient can return home with the following       Equipment Recommendations None recommended by PT  Recommendations for Other Services       Functional Status Assessment Patient has had a recent decline in their functional status and demonstrates the ability to make significant improvements in function in a reasonable and predictable amount of time.     Precautions / Restrictions Precautions Precautions: Fall Restrictions Weight Bearing Restrictions: No      Mobility  Bed Mobility Overal bed mobility: Independent             General bed mobility comments: HOB flat, no difficulty    Transfers Overall transfer level: Needs assistance   Transfers: Sit to/from  Stand Sit to Stand: Supervision           General transfer comment: no difficulty, supervision for line management    Ambulation/Gait Ambulation/Gait assistance: Min guard Gait Distance (Feet): 150 Feet Assistive device: None Gait Pattern/deviations: WFL(Within Functional Limits) Gait velocity: wfl Gait velocity interpretation: 1.31 - 2.62 ft/sec, indicative of limited community ambulator   General Gait Details: pt initially with R limp/antalgia however t/o ambulation progressed to smooth gait pattern  Stairs            Wheelchair Mobility    Modified Rankin (Stroke Patients Only) Modified Rankin (Stroke Patients Only) Pre-Morbid Rankin Score: No symptoms Modified Rankin: Slight disability     Balance Overall balance assessment: No apparent balance deficits (not formally assessed) (pt able to turn head in all directions without LOB while walking)                                           Pertinent Vitals/Pain Pain Assessment Pain Assessment: Faces Faces Pain Scale: Hurts a little bit Pain Location: Bil posterior shoulders Pain Descriptors / Indicators: Sore    Home Living Family/patient expects to be discharged to:: Private residence Living Arrangements: Spouse/significant other;Other relatives Available Help at Discharge: Family;Available PRN/intermittently Type of Home: House Home Access: Ramped entrance       Home Layout: One level Home Equipment: BSC/3in1;Grab bars - tub/shower;Grab bars - toilet;Hand held shower head;Shower seat      Prior Function Prior Level of Function : Independent/Modified Independent;Driving  Mobility Comments: no AD, drives ADLs Comments: caregiver to wive, has to physically help with transfers, cooks     Hand Dominance   Dominant Hand: Right    Extremity/Trunk Assessment   Upper Extremity Assessment Upper Extremity Assessment: Defer to OT evaluation    Lower Extremity  Assessment Lower Extremity Assessment: Overall WFL for tasks assessed    Cervical / Trunk Assessment Cervical / Trunk Assessment: Normal  Communication   Communication: No difficulties  Cognition Arousal/Alertness: Awake/alert Behavior During Therapy: WFL for tasks assessed/performed Overall Cognitive Status: Within Functional Limits for tasks assessed                                          General Comments General comments (skin integrity, edema, etc.): VSS    Exercises     Assessment/Plan    PT Assessment Patient needs continued PT services  PT Problem List Decreased activity tolerance;Decreased balance;Decreased mobility       PT Treatment Interventions DME instruction;Gait training;Stair training;Functional mobility training;Therapeutic activities;Therapeutic exercise    PT Goals (Current goals can be found in the Care Plan section)  Acute Rehab PT Goals Patient Stated Goal: home PT Goal Formulation: With patient Time For Goal Achievement: 12/01/22 Potential to Achieve Goals: Good Additional Goals Additional Goal #1: Pt to score >19 on DGI to indicate minimal falls risk.    Frequency Min 2X/week     Co-evaluation               AM-PAC PT "6 Clicks" Mobility  Outcome Measure Help needed turning from your back to your side while in a flat bed without using bedrails?: None Help needed moving from lying on your back to sitting on the side of a flat bed without using bedrails?: None Help needed moving to and from a bed to a chair (including a wheelchair)?: None Help needed standing up from a chair using your arms (e.g., wheelchair or bedside chair)?: None Help needed to walk in hospital room?: None Help needed climbing 3-5 steps with a railing? : A Little 6 Click Score: 23    End of Session Equipment Utilized During Treatment: Gait belt Activity Tolerance: Patient tolerated treatment well Patient left: in bed;with call bell/phone within  reach (sitting EOB) Nurse Communication: Mobility status PT Visit Diagnosis: Unsteadiness on feet (R26.81);Other symptoms and signs involving the nervous system (R29.898)    Time: 5400-8676 PT Time Calculation (min) (ACUTE ONLY): 25 min   Charges:   PT Evaluation $PT Eval Moderate Complexity: 1 Mod PT Treatments $Gait Training: 8-22 mins        Kittie Plater, PT, DPT Acute Rehabilitation Services Secure chat preferred Office #: [phone]   Berline Lopes 11/17/2022, 2:11 PM

## 2022-11-17 NOTE — Progress Notes (Signed)
ANTICOAGULATION CONSULT NOTE - Initial Consult  Pharmacy Consult for heparin Indication: stroke  Allergies  Allergen Reactions   Demerol Other (See Comments)    hallucinations    Patient Measurements: Weight: 81.2 kg (179 lb) Heparin Dosing Weight: 81kg  Vital Signs: Temp: 97.6 F (36.4 C) (02/04 0800) Temp Source: Oral (02/04 0800) BP: 139/88 (02/04 1000) Pulse Rate: 65 (02/04 1000)  Labs: Recent Labs    11/16/22 0402 11/16/22 0415 11/16/22 0515 11/16/22 1159 11/17/22 0348 11/17/22 0956  HGB 12.1* 11.9*  --  14.3 15.3  --   HCT 35.5* 35.0*  --  41.2 45.3  --   PLT 119*  --   --  152 147*  --   APTT  --  26  --   --   --   --   LABPROT  --  14.1  --   --   --   --   INR  --  1.1  --   --   --   --   HEPARINUNFRC  --   --   --   --   --  0.37  CREATININE 1.20 1.10  --   --  1.15  --   TROPONINIHS 6  --  9  --   --   --      Estimated Creatinine Clearance: 58.1 mL/min (by C-G formula based on SCr of 1.15 mg/dL).   Medical History: Past Medical History:  Diagnosis Date   Alcohol abuse, in remission    Arthritis    Bleeding per rectum    BPH (benign prostatic hyperplasia)    CAD (coronary artery disease)    a. s/p CABG 1996  b. LHC in 11/2013 w/ patent grafts. c. 12/19/14 re-look cath with patent grafts and good LVF   Erectile dysfunction    GERD (gastroesophageal reflux disease)    History of lower GI bleeding    HLD (hyperlipidemia)    HTN (hypertension)    Prostate cancer ([place])    a. s/p radiation    S/P angioplasty with stent-DES ostial VG to PDA and DES ostial VG to diag. 08/27/19 08/28/2019   Type II diabetes mellitus (HCC)     Medications:  Facility-Administered Medications Prior to Admission  Medication Dose Route Frequency Provider Last Rate Last Admin   sodium chloride flush (NS) 0.9 % injection 3 mL  3 mL Intravenous Q12H Martinique, Peter M, MD       Medications Prior to Admission  Medication Sig Dispense Refill Last Dose   acetaminophen  (TYLENOL) 650 MG CR tablet Take 650-1,300 mg by mouth every 8 (eight) hours as needed for pain.   11/16/2022 at 0130   ALPRAZolam (XANAX) 0.25 MG tablet Take 0.25 mg by mouth 2 (two) times daily as needed for anxiety.   Past Month   amLODipine (NORVASC) 10 MG tablet Take 1 tablet (10 mg total) by mouth every morning. 90 tablet 3 11/15/2022 at am   ferrous sulfate 325 (65 FE) MG tablet Take 1 tablet (325 mg total) by mouth daily. 30 tablet 3 11/15/2022 at am   insulin detemir (LEVEMIR FLEXTOUCH) 100 UNIT/ML FlexPen Inject 20 Units into the skin daily as needed (CBG >130).   11/15/2022 at am   isosorbide mononitrate (IMDUR) 120 MG 24 hr tablet TAKE 1 TABLET (120 MG TOTAL) BY MOUTH DAILY. 90 tablet 3 11/15/2022 at am   metFORMIN (GLUCOPHAGE) 1000 MG tablet Take 1,000 mg by mouth daily.   11/15/2022   metoprolol  succinate (TOPROL-XL) 50 MG 24 hr tablet Take 1 tablet (50 mg total) by mouth daily. 90 tablet 3 11/15/2022 at 0800   Multiple Vitamin (MULTIVITAMIN WITH MINERALS) TABS Take 1 tablet by mouth every morning.   11/15/2022   nitroGLYCERIN (NITROSTAT) 0.4 MG SL tablet PLACE 1 TABLET UNDER THE TONGUE EVERY 5 MINUTES FOR 3 DOSES AS NEEDED FOR CHEST PAIN (Patient taking differently: Place 0.4 mg under the tongue every 5 (five) minutes as needed for chest pain.) 25 tablet 3 11/16/2022   prasugrel (EFFIENT) 10 MG TABS tablet Take 1 tablet (10 mg total) by mouth daily. 90 tablet 3 11/15/2022 at 0800   rosuvastatin (CRESTOR) 40 MG tablet TAKE 1 TABLET (40 MG TOTAL) BY MOUTH DAILY. 90 tablet 3 11/15/2022 at am   sitaGLIPtin (JANUVIA) 100 MG tablet Take 100 mg by mouth every morning.   11/15/2022 at am   traMADol (ULTRAM) 50 MG tablet Take 50 mg by mouth 2 (two) times daily as needed for pain.   Past Week   Continuous Blood Gluc Receiver (FREESTYLE LIBRE 2 READER) DEVI See admin instructions.      Continuous Blood Gluc Sensor (FREESTYLE LIBRE 2 SENSOR) MISC See admin instructions.      Insulin Pen Needle (BD PEN NEEDLE NANO U/F) 32G  X 4 MM MISC 1 pen by Does not apply route daily. 30 each 11    Scheduled:   amLODipine  10 mg Oral q morning   Chlorhexidine Gluconate Cloth  6 each Topical Daily   insulin aspart  0-15 Units Subcutaneous TID WC   insulin aspart  0-5 Units Subcutaneous QHS   insulin detemir  10 Units Subcutaneous Daily   [START ON 11/18/2022] metFORMIN  1,000 mg Oral BID WC   pantoprazole (PROTONIX) IV  40 mg Intravenous QHS   rosuvastatin  40 mg Oral Daily   Infusions:   heparin 1,100 Units/hr (11/17/22 1000)    Assessment: Pt is s/p CVA with TNK on 2/3. Plan for arteriogram on Monday. Start heparin per neurology with low goal.  Heparin level therapeutic at 0.37. CBC stable/wnl. No issues with infusion running  Goal of Therapy:  Heparin level 0.3-0.5 units/ml Monitor platelets by anticoagulation protocol: Yes   Plan:  Continue heparin drip at 1100 units/hr Confirmatory heparin level in 8 hours Daily HL and CBC  Dimple Nanas, PharmD, BCPS 11/17/2022 10:38 AM

## 2022-11-17 NOTE — Progress Notes (Signed)
ANTICOAGULATION CONSULT NOTE  Pharmacy Consult for heparin Indication: stroke  Allergies  Allergen Reactions   Demerol Other (See Comments)    hallucinations    Patient Measurements: Weight: 81.2 kg (179 lb) Heparin Dosing Weight: 81kg  Vital Signs: Temp: 97.7 F (36.5 C) (02/04 1600) Temp Source: Oral (02/04 1600) BP: 128/115 (02/04 1800) Pulse Rate: 68 (02/04 1800)  Labs: Recent Labs    11/16/22 0402 11/16/22 0415 11/16/22 0515 11/16/22 1159 11/17/22 0348 11/17/22 0956 11/17/22 1806  HGB 12.1* 11.9*  --  14.3 15.3  --   --   HCT 35.5* 35.0*  --  41.2 45.3  --   --   PLT 119*  --   --  152 147*  --   --   APTT  --  26  --   --   --   --   --   LABPROT  --  14.1  --   --   --   --   --   INR  --  1.1  --   --   --   --   --   HEPARINUNFRC  --   --   --   --   --  0.37 0.45  CREATININE 1.20 1.10  --   --  1.15  --   --   TROPONINIHS 6  --  9  --   --   --   --      Estimated Creatinine Clearance: 58.1 mL/min (by C-G formula based on SCr of 1.15 mg/dL).   Medical History: Past Medical History:  Diagnosis Date   Alcohol abuse, in remission    Arthritis    Bleeding per rectum    BPH (benign prostatic hyperplasia)    CAD (coronary artery disease)    a. s/p CABG 1996  b. LHC in 11/2013 w/ patent grafts. c. 12/19/14 re-look cath with patent grafts and good LVF   Erectile dysfunction    GERD (gastroesophageal reflux disease)    History of lower GI bleeding    HLD (hyperlipidemia)    HTN (hypertension)    Prostate cancer ([place])    a. s/p radiation    S/P angioplasty with stent-DES ostial VG to PDA and DES ostial VG to diag. 08/27/19 08/28/2019   Type II diabetes mellitus (HCC)     Medications:  Facility-Administered Medications Prior to Admission  Medication Dose Route Frequency Provider Last Rate Last Admin   sodium chloride flush (NS) 0.9 % injection 3 mL  3 mL Intravenous Q12H Martinique, Peter M, MD       Medications Prior to Admission  Medication Sig  Dispense Refill Last Dose   acetaminophen (TYLENOL) 650 MG CR tablet Take 650-1,300 mg by mouth every 8 (eight) hours as needed for pain.   11/16/2022 at 0130   ALPRAZolam (XANAX) 0.25 MG tablet Take 0.25 mg by mouth 2 (two) times daily as needed for anxiety.   Past Month   amLODipine (NORVASC) 10 MG tablet Take 1 tablet (10 mg total) by mouth every morning. 90 tablet 3 11/15/2022 at am   ferrous sulfate 325 (65 FE) MG tablet Take 1 tablet (325 mg total) by mouth daily. 30 tablet 3 11/15/2022 at am   insulin detemir (LEVEMIR FLEXTOUCH) 100 UNIT/ML FlexPen Inject 20 Units into the skin daily as needed (CBG >130).   11/15/2022 at am   isosorbide mononitrate (IMDUR) 120 MG 24 hr tablet TAKE 1 TABLET (120 MG TOTAL) BY MOUTH DAILY. [place]  tablet 3 11/15/2022 at am   metFORMIN (GLUCOPHAGE) 1000 MG tablet Take 1,000 mg by mouth daily.   11/15/2022   metoprolol succinate (TOPROL-XL) 50 MG 24 hr tablet Take 1 tablet (50 mg total) by mouth daily. 90 tablet 3 11/15/2022 at 0800   Multiple Vitamin (MULTIVITAMIN WITH MINERALS) TABS Take 1 tablet by mouth every morning.   11/15/2022   nitroGLYCERIN (NITROSTAT) 0.4 MG SL tablet PLACE 1 TABLET UNDER THE TONGUE EVERY 5 MINUTES FOR 3 DOSES AS NEEDED FOR CHEST PAIN (Patient taking differently: Place 0.4 mg under the tongue every 5 (five) minutes as needed for chest pain.) 25 tablet 3 11/16/2022   prasugrel (EFFIENT) 10 MG TABS tablet Take 1 tablet (10 mg total) by mouth daily. 90 tablet 3 11/15/2022 at 0800   rosuvastatin (CRESTOR) 40 MG tablet TAKE 1 TABLET (40 MG TOTAL) BY MOUTH DAILY. 90 tablet 3 11/15/2022 at am   sitaGLIPtin (JANUVIA) 100 MG tablet Take 100 mg by mouth every morning.   11/15/2022 at am   traMADol (ULTRAM) 50 MG tablet Take 50 mg by mouth 2 (two) times daily as needed for pain.   Past Week   Continuous Blood Gluc Receiver (FREESTYLE LIBRE 2 READER) DEVI See admin instructions.      Continuous Blood Gluc Sensor (FREESTYLE LIBRE 2 SENSOR) MISC See admin instructions.       Insulin Pen Needle (BD PEN NEEDLE NANO U/F) 32G X 4 MM MISC 1 pen by Does not apply route daily. 30 each 11    Scheduled:   amLODipine  10 mg Oral q morning   Chlorhexidine Gluconate Cloth  6 each Topical Daily   insulin aspart  0-15 Units Subcutaneous TID WC   insulin aspart  0-5 Units Subcutaneous QHS   insulin detemir  10 Units Subcutaneous BID   [START ON 11/18/2022] metFORMIN  1,000 mg Oral BID WC   pantoprazole (PROTONIX) IV  40 mg Intravenous QHS   rosuvastatin  40 mg Oral Daily   Infusions:   heparin 1,100 Units/hr (11/17/22 1800)    Assessment: Pt is s/p CVA with TNK on 2/3. Plan for arteriogram on Monday. Start heparin per neurology with low goal.  Heparin level remains therapeutic at 0.45. Hg WNL, plt down a bit to 147. No bleed issues reported.  Goal of Therapy:  Heparin level 0.3-0.5 units/ml Monitor platelets by anticoagulation protocol: Yes   Plan:  Continue heparin drip at 1100 units/hr Monitor daily heparin level and CBC, s/sx bleeding   Arturo Morton, PharmD, BCPS Please check AMION for all [place] contact numbers Clinical Pharmacist 11/17/2022 7:10 PM

## 2022-11-17 NOTE — Evaluation (Signed)
Occupational Therapy Evaluation Patient Details Name: Antonio [name] MRN: [id number] DOB: 11-26-1945 Today's Date: 11/17/2022   History of Present Illness Antonio [name] is a 77 y.o. male presenting to hospital with chest pain, right sided weakness. 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 PMHx of hypertension, hyperlipidemia, type 2 diabetes, coronary artery disease s/p CABG, remote, prostate cancer s/p radiation, remote GI/rectal bleed   Clinical Impression   This 77 yo male admitted with above presents to acute OT with PLOF of being totally independent with all basic ADLs, IADLs, and driving. He currently is overall at a S level due to recent events that precipitated his admit to hospital.We will continue to follow to make sure he is the same or better once he has his arteriogram tomorrow.      Recommendations for follow up therapy are one component of a multi-disciplinary discharge planning process, led by the attending physician.  Recommendations may be updated based on patient status, additional functional criteria and insurance authorization.   Follow Up Recommendations  No OT follow up     Assistance Recommended at Discharge PRN  Patient can return home with the following  (no A, only S)    Functional Status Assessment  Patient has had a recent decline in their functional status and demonstrates the ability to make significant improvements in function in a reasonable and predictable amount of time.  Equipment Recommendations  None recommended by OT       Precautions / Restrictions Precautions Precautions: None Restrictions Weight Bearing Restrictions: No      Mobility Bed Mobility Overal bed mobility: Independent                  Transfers Overall transfer level: Needs assistance   Transfers: Sit to/from Stand Sit to Stand: Supervision           General transfer comment: ambulation in room S  without AD      Balance Overall balance assessment: No apparent balance deficits (not formally assessed)                                         ADL either performed or assessed with clinical judgement   ADL                                         General ADL Comments: overall S     Vision Baseline Vision/History: 1 Wears glasses Ability to See in Adequate Light: 0 Adequate Patient Visual Report: No change from baseline Vision Assessment?: Yes Eye Alignment: Within Functional Limits Ocular Range of Motion: Within Functional Limits Alignment/Gaze Preference: Within Defined Limits Tracking/Visual Pursuits: Able to track stimulus in all quads without difficulty Saccades: Within functional limits Convergence: Within functional limits Visual Fields: No apparent deficits            Pertinent Vitals/Pain Pain Assessment Pain Assessment: Faces Faces Pain Scale: Hurts a little bit Pain Location: Bil posterior shoulders Pain Descriptors / Indicators: Sore Pain Intervention(s): Limited activity within patient's tolerance, Monitored during session, Repositioned     Hand Dominance Right   Extremity/Trunk Assessment Upper Extremity Assessment Upper Extremity Assessment: RUE deficits/detail RUE Deficits / Details: WFL and strength; reports slight decreased sensation compared to LUE RUE Sensation: decreased light  touch           Communication Communication Communication: No difficulties   Cognition Arousal/Alertness: Awake/alert Behavior During Therapy: WFL for tasks assessed/performed Overall Cognitive Status: Within Functional Limits for tasks assessed                                                  Home Living Family/patient expects to be discharged to:: Private residence Living Arrangements: Spouse/significant other;Other relatives Available Help at Discharge: Family;Available PRN/intermittently Type of Home:  House Home Access: Ramped entrance     Home Layout: One level     Bathroom Shower/Tub: Walk-in shower;Curtain   Bathroom Toilet: Handicapped height     Home Equipment: BSC/3in1;Grab bars - tub/shower;Grab bars - toilet;Hand held shower head;Shower seat          Prior Functioning/Environment Prior Level of Function : Independent/Modified Independent;Driving                        OT Problem List: Impaired sensation      OT Treatment/Interventions: Self-care/ADL training;DME and/or AE instruction;Patient/family education    OT Goals(Current goals can be found in the care plan section) Acute Rehab OT Goals Patient Stated Goal: to get procedure done tomorrow, all be well, and go home OT Goal Formulation: With patient Time For Goal Achievement: 12/01/22 Potential to Achieve Goals: Good  OT Frequency: Min 2X/week       AM-PAC OT "6 Clicks" Daily Activity     Outcome Measure Help from another person eating meals?: None Help from another person taking care of personal grooming?: A Little Help from another person toileting, which includes using toliet, bedpan, or urinal?: A Little Help from another person bathing (including washing, rinsing, drying)?: A Little Help from another person to put on and taking off regular upper body clothing?: A Little Help from another person to put on and taking off regular lower body clothing?: A Little 6 Click Score: 19   End of Session Equipment Utilized During Treatment: Gait belt Nurse Communication: Mobility status  Activity Tolerance: Patient tolerated treatment well Patient left: in chair;with call bell/phone within reach;with chair alarm set  OT Visit Diagnosis: Unsteadiness on feet (R26.81)                Time: 2751-7001 OT Time Calculation (min): 19 min Charges:  OT General Charges $OT Visit: 1 Visit OT Evaluation $OT Eval Moderate Complexity: 1 Mod  [place] Office  [phone]    Almon Register 11/17/2022, 8:37 AM

## 2022-11-17 NOTE — Progress Notes (Addendum)
ANTICOAGULATION CONSULT NOTE - Initial Consult  Pharmacy Consult for heparin Indication: stroke  Allergies  Allergen Reactions   Demerol Other (See Comments)    hallucinations    Patient Measurements: Weight: 81.2 kg (179 lb) Heparin Dosing Weight: 81kg  Vital Signs: Temp: 99 F (37.2 C) (02/03 2000) Temp Source: Oral (02/03 2000) BP: 156/95 (02/04 0000) Pulse Rate: 69 (02/04 0000)  Labs: Recent Labs    11/16/22 0402 11/16/22 0415 11/16/22 0515 11/16/22 1159  HGB 12.1* 11.9*  --  14.3  HCT 35.5* 35.0*  --  41.2  PLT 119*  --   --  152  APTT  --  26  --   --   LABPROT  --  14.1  --   --   INR  --  1.1  --   --   CREATININE 1.20 1.10  --   --   TROPONINIHS 6  --  9  --     Estimated Creatinine Clearance: 60.8 mL/min (by C-G formula based on SCr of 1.1 mg/dL).   Medical History: Past Medical History:  Diagnosis Date   Alcohol abuse, in remission    Arthritis    Bleeding per rectum    BPH (benign prostatic hyperplasia)    CAD (coronary artery disease)    a. s/p CABG 1996  b. LHC in 11/2013 w/ patent grafts. c. 12/19/14 re-look cath with patent grafts and good LVF   Erectile dysfunction    GERD (gastroesophageal reflux disease)    History of lower GI bleeding    HLD (hyperlipidemia)    HTN (hypertension)    Prostate cancer ([place])    a. s/p radiation    S/P angioplasty with stent-DES ostial VG to PDA and DES ostial VG to diag. 08/27/19 08/28/2019   Type II diabetes mellitus (HCC)     Medications:  Facility-Administered Medications Prior to Admission  Medication Dose Route Frequency Provider Last Rate Last Admin   sodium chloride flush (NS) 0.9 % injection 3 mL  3 mL Intravenous Q12H Martinique, Peter M, MD       Medications Prior to Admission  Medication Sig Dispense Refill Last Dose   acetaminophen (TYLENOL) 650 MG CR tablet Take 650-1,300 mg by mouth every 8 (eight) hours as needed for pain.   11/16/2022 at 0130   ALPRAZolam (XANAX) 0.25 MG tablet Take 0.25 mg  by mouth 2 (two) times daily as needed for anxiety.   Past Month   amLODipine (NORVASC) 10 MG tablet Take 1 tablet (10 mg total) by mouth every morning. 90 tablet 3 11/15/2022 at am   ferrous sulfate 325 (65 FE) MG tablet Take 1 tablet (325 mg total) by mouth daily. 30 tablet 3 11/15/2022 at am   insulin detemir (LEVEMIR FLEXTOUCH) 100 UNIT/ML FlexPen Inject 20 Units into the skin daily as needed (CBG >130).   11/15/2022 at am   isosorbide mononitrate (IMDUR) 120 MG 24 hr tablet TAKE 1 TABLET (120 MG TOTAL) BY MOUTH DAILY. 90 tablet 3 11/15/2022 at am   metFORMIN (GLUCOPHAGE) 1000 MG tablet Take 1,000 mg by mouth daily.   11/15/2022   metoprolol succinate (TOPROL-XL) 50 MG 24 hr tablet Take 1 tablet (50 mg total) by mouth daily. 90 tablet 3 11/15/2022 at 0800   Multiple Vitamin (MULTIVITAMIN WITH MINERALS) TABS Take 1 tablet by mouth every morning.   11/15/2022   nitroGLYCERIN (NITROSTAT) 0.4 MG SL tablet PLACE 1 TABLET UNDER THE TONGUE EVERY 5 MINUTES FOR 3 DOSES AS NEEDED FOR  CHEST PAIN (Patient taking differently: Place 0.4 mg under the tongue every 5 (five) minutes as needed for chest pain.) 25 tablet 3 11/16/2022   prasugrel (EFFIENT) 10 MG TABS tablet Take 1 tablet (10 mg total) by mouth daily. 90 tablet 3 11/15/2022 at 0800   rosuvastatin (CRESTOR) 40 MG tablet TAKE 1 TABLET (40 MG TOTAL) BY MOUTH DAILY. 90 tablet 3 11/15/2022 at am   sitaGLIPtin (JANUVIA) 100 MG tablet Take 100 mg by mouth every morning.   11/15/2022 at am   traMADol (ULTRAM) 50 MG tablet Take 50 mg by mouth 2 (two) times daily as needed for pain.   Past Week   Continuous Blood Gluc Receiver (FREESTYLE LIBRE 2 READER) DEVI See admin instructions.      Continuous Blood Gluc Sensor (FREESTYLE LIBRE 2 SENSOR) MISC See admin instructions.      Insulin Pen Needle (BD PEN NEEDLE NANO U/F) 32G X 4 MM MISC 1 pen by Does not apply route daily. 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 Plan for arteriogram on Monday. Start heparin per neurology with low goal.  Goal of Therapy:  Heparin level 0.3-0.5 units/ml Monitor platelets by anticoagulation protocol: Yes   Plan:  Heparin 1100 units/hr Check 8 hr HL Daily HL and CBC  Onnie Boer, PharmD, BCIDP, AAHIVP, CPP Infectious Disease Pharmacist 11/17/2022 12:35 AM

## 2022-11-17 NOTE — Progress Notes (Signed)
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  Exam remains stable per nursing  ~20 hours post-TNK, risk/benefit profile in favor of starting drip now given it will take some time to become therapeutic   Lesleigh Noe MD-PhD [facility] [phone] Available 7 PM to 7 AM, outside of these hours please call Neurologist on call as listed on Amion.  No charge note

## 2022-11-18 ENCOUNTER — Other Ambulatory Visit: Payer: Self-pay | Admitting: Radiology

## 2022-11-18 ENCOUNTER — Inpatient Hospital Stay (HOSPITAL_COMMUNITY): Payer: 59

## 2022-11-18 DIAGNOSIS — I63111 Cerebral infarction due to embolism of right vertebral artery: Secondary | ICD-10-CM | POA: Diagnosis not present

## 2022-11-18 HISTORY — PX: IR ANGIO VERTEBRAL SEL SUBCLAVIAN INNOMINATE BILAT MOD SED: IMG5366

## 2022-11-18 HISTORY — PX: IR US GUIDE VASC ACCESS RIGHT: IMG2390

## 2022-11-18 HISTORY — PX: IR ANGIO INTRA EXTRACRAN SEL COM CAROTID INNOMINATE BILAT MOD SED: IMG5360

## 2022-11-18 LAB — CBC WITH DIFFERENTIAL/PLATELET
Abs Immature Granulocytes: 0.02 10*3/uL (ref 0.00–0.07)
Basophils Absolute: 0 10*3/uL (ref 0.0–0.1)
Basophils Relative: 0 %
Eosinophils Absolute: 0.3 10*3/uL (ref 0.0–0.5)
Eosinophils Relative: 4 %
HCT: 43.8 % (ref 39.0–52.0)
Hemoglobin: 15.1 g/dL (ref 13.0–17.0)
Immature Granulocytes: 0 %
Lymphocytes Relative: 35 %
Lymphs Abs: 2.4 10*3/uL (ref 0.7–4.0)
MCH: 29.2 pg (ref 26.0–34.0)
MCHC: 34.5 g/dL (ref 30.0–36.0)
MCV: 84.7 fL (ref 80.0–100.0)
Monocytes Absolute: 0.5 10*3/uL (ref 0.1–1.0)
Monocytes Relative: 8 %
Neutro Abs: 3.8 10*3/uL (ref 1.7–7.7)
Neutrophils Relative %: 53 %
Platelets: 147 10*3/uL — ABNORMAL LOW (ref 150–400)
RBC: 5.17 MIL/uL (ref 4.22–5.81)
RDW: 15.3 % (ref 11.5–15.5)
WBC: 7.1 10*3/uL (ref 4.0–10.5)
nRBC: 0 % (ref 0.0–0.2)

## 2022-11-18 LAB — GLUCOSE, CAPILLARY
Glucose-Capillary: 142 mg/dL — ABNORMAL HIGH (ref 70–99)
Glucose-Capillary: 140 mg/dL — ABNORMAL HIGH (ref 70–99)
Glucose-Capillary: 411 mg/dL — ABNORMAL HIGH (ref 70–99)
Glucose-Capillary: 145 mg/dL — ABNORMAL HIGH (ref 70–99)
Glucose-Capillary: 147 mg/dL — ABNORMAL HIGH (ref 70–99)
Glucose-Capillary: 186 mg/dL — ABNORMAL HIGH (ref 70–99)

## 2022-11-18 LAB — HEPARIN LEVEL (UNFRACTIONATED): Heparin Unfractionated: 0.45 IU/mL (ref 0.30–0.70)

## 2022-11-18 MED ORDER — MIDAZOLAM HCL 2 MG/2ML IJ SOLN
INTRAMUSCULAR | Status: AC | PRN
  Administered 2022-11-18: 1 mg via INTRAVENOUS

## 2022-11-18 MED ORDER — LIDOCAINE HCL 1 % IJ SOLN
INTRAMUSCULAR | Status: AC
  Administered 2022-11-18: 5 mL
  Filled 2022-11-18: qty 20

## 2022-11-18 MED ORDER — METOPROLOL TARTRATE 25 MG PO TABS
12.5000 mg | ORAL_TABLET | Freq: Two times a day (BID) | ORAL | Status: DC
  Administered 2022-11-18 – 2022-11-19 (×2): 12.5 mg via ORAL
  Filled 2022-11-18 (×2): qty 1

## 2022-11-18 MED ORDER — HEPARIN SODIUM (PORCINE) 1000 UNIT/ML IJ SOLN
INTRAMUSCULAR | Status: AC
  Filled 2022-11-18: qty 10

## 2022-11-18 MED ORDER — SODIUM CHLORIDE (PF) 0.9 % IJ SOLN
INTRAVENOUS | Status: AC | PRN
  Administered 2022-11-18: 200 ug via INTRA_ARTERIAL

## 2022-11-18 MED ORDER — FENTANYL CITRATE (PF) 100 MCG/2ML IJ SOLN
INTRAMUSCULAR | Status: AC | PRN
  Administered 2022-11-18: 25 ug via INTRAVENOUS

## 2022-11-18 MED ORDER — ASPIRIN 81 MG PO TBEC
81.0000 mg | DELAYED_RELEASE_TABLET | Freq: Every day | ORAL | Status: DC
  Administered 2022-11-18 – 2022-11-19 (×2): 81 mg via ORAL
  Filled 2022-11-18 (×2): qty 1

## 2022-11-18 MED ORDER — IOHEXOL 300 MG/ML  SOLN
150.0000 mL | Freq: Once | INTRAMUSCULAR | Status: AC | PRN
  Administered 2022-11-18: 80 mL via INTRA_ARTERIAL

## 2022-11-18 MED ORDER — INSULIN DETEMIR 100 UNIT/ML ~~LOC~~ SOLN
12.0000 [IU] | Freq: Two times a day (BID) | SUBCUTANEOUS | Status: DC
  Administered 2022-11-18 – 2022-11-19 (×2): 12 [IU] via SUBCUTANEOUS
  Filled 2022-11-18 (×3): qty 0.12

## 2022-11-18 MED ORDER — MIDAZOLAM HCL 2 MG/2ML IJ SOLN
INTRAMUSCULAR | Status: AC
  Filled 2022-11-18: qty 2

## 2022-11-18 MED ORDER — NITROGLYCERIN 1 MG/10 ML FOR IR/CATH LAB
INTRA_ARTERIAL | Status: AC
  Filled 2022-11-18: qty 10

## 2022-11-18 MED ORDER — SODIUM CHLORIDE 0.9 % IV SOLN: INTRAVENOUS | Status: AC

## 2022-11-18 MED ORDER — VERAPAMIL HCL 2.5 MG/ML IV SOLN
INTRA_ARTERIAL | Status: AC | PRN
  Administered 2022-11-18: 8 mL via INTRA_ARTERIAL

## 2022-11-18 MED ORDER — VERAPAMIL HCL 2.5 MG/ML IV SOLN
INTRAVENOUS | Status: AC
  Filled 2022-11-18: qty 2

## 2022-11-18 MED ORDER — FENTANYL CITRATE (PF) 100 MCG/2ML IJ SOLN
INTRAMUSCULAR | Status: AC
  Filled 2022-11-18: qty 2

## 2022-11-18 MED ORDER — CLOPIDOGREL BISULFATE 75 MG PO TABS
75.0000 mg | ORAL_TABLET | Freq: Every day | ORAL | Status: DC
  Administered 2022-11-18 – 2022-11-19 (×2): 75 mg via ORAL
  Filled 2022-11-18 (×2): qty 1

## 2022-11-18 NOTE — Progress Notes (Addendum)
STROKE TEAM PROGRESS NOTE   INTERVAL HISTORY No family at the bedside.   Angiogram today with Deveshwar. TR band in place, will need q1hr checks for 6 hours and then he can be transferred to the floor.   Vitals:   11/18/22 0850 11/18/22 0855 11/18/22 0900 11/18/22 0905  BP: (!) 152/83 (!) 149/96 (!) 153/80 123/83  Pulse: 63 64 66 70  Resp: '12 16 12 15  '$ Temp:      TempSrc:      SpO2: 98% 98% 96% 96%  Weight:       CBC:  Recent Labs  Lab 11/17/22 0348 11/18/22 0342  WBC 7.1 7.1  NEUTROABS 4.1 3.8  HGB 15.3 15.1  HCT 45.3 43.8  MCV 87.3 84.7  PLT 147* 147*    Basic Metabolic Panel:  Recent Labs  Lab 11/16/22 0402 11/16/22 0415 11/17/22 0348  NA 136 141 140  K 3.5 3.6 3.5  CL 105 106 105  CO2 24  --  25  GLUCOSE 172* 167* 157*  BUN '12 13 10  '$ CREATININE 1.20 1.10 1.15  CALCIUM 7.8*  --  9.0    Lipid Panel:  Recent Labs  Lab 11/17/22 0348  CHOL 133  TRIG 73  HDL 41  CHOLHDL 3.2  VLDL 15  LDLCALC 77    HgbA1c:  Recent Labs  Lab 11/16/22 0634  HGBA1C 7.9*    Urine Drug Screen:  Recent Labs  Lab 11/16/22 0413  LABOPIA NONE DETECTED  COCAINSCRNUR NONE DETECTED  LABBENZ NONE DETECTED  AMPHETMU NONE DETECTED  THCU NONE DETECTED  LABBARB NONE DETECTED     Alcohol Level  Recent Labs  Lab 11/16/22 0415  ETH <10     IMAGING past 24 hours No results found.  PHYSICAL EXAM Constitutional: Appears well-developed and well-nourished.  Cardiovascular: Normal rate and regular rhythm. Perfusing extremities well Respiratory: Effort normal, non-labored breathing   Neuro: Mental Status: Patient is awake, alert, oriented to person, place, month, year, and situation. Patient is able to give a clear and coherent history. No signs of aphasia or neglect Cranial Nerves: II: Visual Fields are full. Pupils are equal, round, and reactive to light.   III,IV, VI: EOMI without ptosis or diploplia.  V: Facial sensation is mildly reduced on the right  side VII: Facial movement is symmetric.  VIII: hearing is intact to voice X: Uvula elevates symmetrically XI: Shoulder shrug is symmetric. XII: tongue is midline  Motor: Tone is normal. Bulk is normal.  5-/5 RUE. 5/5 other extremities.  Sensory: Normal sensation b/l to light touch. Coordination FNF and HKS are symmetric bilaterally  Gait:  Deferred   ASSESSMENT/PLAN Mr.  ADEN SEK is a 77 y.o. male with a PMHx of hypertension, hyperlipidemia, type 2 diabetes, coronary artery disease s/p CABG, remote, prostate cancer s/p radiation, remote GI/rectal bleed   He reports he had been in his usual state of health, went to bed around 7:30 PM, woke up at 1 AM, used the bathroom and ambulated normally, after which he was again in bed watching TV when he had sudden onset chest pain starting at about 2:45 AM for which he activated EMS. Generally weak and noted he was staggering and having trouble walking by EMS.    On arrival to the ED he had sudden onset of right sided weakness that was quite severe but rapidly improving  He was also noted to be initially hypotensive to 70/40 on ED arrival, on multiple checks for at least 5  minutes.  However his blood pressure was normotensive with systolic blood pressures 244-010 within minutes of this.  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 ASPECTS 10.    CTA head & neck 0335 11/17/22 Recent appearing right V1 and V2 occlusion with less intense right vertebral flow at the vertebrobasilar junction. No visible intracranial branch embolism. Overall mild atherosclerosis in the head and neck. Repeat CTA 0535 11/16/22 Known right proximal vertebral occlusion with worsened flow in the reconstituted distal segments although  the distal right V4 segment and right PICA remain enhancing. The left vertebral and basilar are widely patent with no visible downstream emboli. No intracranial hemorrhage.: CTA Chest/Ab/Pelvis: No evidence of aortic aneurysm or dissection. No acute process in the chest, abdomen, or pelvis. Small hiatal hernia. Colonic diverticulosis. Bladder diverticula. Cardiomegaly with coronary artery calcifications  MRI small right cerebellar CVA. Cerebral Angiogram done 2/5- . Occluded dominant right vertebral artery proximally, with partial reconstitution at the distal mid one third extracranially, with  reocclusion proximal to the right posterior inferior cerebellar artery. Left vertebral artery injection retrogradely opacifies the distal right vertebral basilar junction to the level of the right posterior inferior cerebellar artery.  2D Echo: LVEF 70-75%, Mild LVH, Grade I diastolic dysfunction.  Mildly reduced RVSF, Mildly elevated PASP, Mild MVR  LDL 59 HgbA1c 7.9 VTE prophylaxis - SCDs    Diet   Diet NPO time specified Except for: Sips with Meds   No antithrombotic prior to admission, now on heparin ggt. Therapy recommendations:  out pt. Disposition:  pending  Hypertension Home meds:  norvasc, TOPROl-XL. Norvasc resumed.  Post TNK for 24  hours < 180/105; as needed labetalol ordered  Long-term BP goal normotensive  Hyperlipidemia Home meds:  crestor '40mg'$ , resumed in hospital LDL 59, goal < 70 Continue statin at discharge  Diabetes type II Uncontrolled Home meds:  metformin, insulin HgbA1c 7.9, goal < 7.0 CBGs Recent Labs    11/18/22 0523 11/18/22 0748 11/18/22 0751  GLUCAP 142* 411* 140*     SSI. Increase levemir to 12units BID Diabetes educator consulted  Other Stroke Risk Factors Advanced Age >/= 15  Former Cigarette smoker ETOH use, alcohol level <10, advised to drink no more than 1-2 drink(s) a day Coronary artery disease  Other Active  Problems Hematuria Known history, per patient. Stable. Hgb 15.1  Hospital day # 2  Patient seen and examined by NP/APP with MD. MD to update note as needed.   Janine Ores, DNP, FNP-BC [facility] Pager: [phone]  ADDENDUM:  77 year old hypertension hyperlipidemia type 2 diabetes coronary disease status post CABG history of prostate cancer s/p radiation and remote history of GI bleed.  He does have hematuria in the past several months being followed by urology outpatient felt to be secondary to prostate cancer.  Angiogram completed today, no dissection. Right vertebral proximal occlusion noted. Stop heparin. Aspirin plavix started. Increase levemir to 12 units BID. Transfer to floor bed after monitoring complete. Started metolpolol 12.'5mg'$  BID.     This patient is critically ill due to respiratory distress, stroke s/p tPA and at significant risk of neurological worsening, death form heart failure, respiratory failure, recurrent stroke, bleeding from [facility], seizure, sepsis. This patient's care requires constant monitoring of vital signs, hemodynamics, respiratory  and cardiac monitoring, review of multiple databases, neurological assessment, discussion with family, other specialists and medical decision making of high complexity. I spent 35 minutes of neurocritical care time in the care of this patient.    Cecylia Brazill,MD   To contact Stroke Continuity provider, please refer to [email or web address] After hours, contact General Neurology

## 2022-11-18 NOTE — Procedures (Signed)
INR.  Four-vessel cerebral arteriogram.  Right radial approach.  Findings.  1. Occluded dominant right vertebral artery proximally, with partial reconstitution at the distal mid one third extracranially, with  reocclusion proximal to the right posterior inferior cerebellar artery. 2.  Left vertebral artery injection retrogradely opacifies the distal right vertebral basilar junction to the level of the right posterior inferior cerebellar artery.  Arlean Hopping MD

## 2022-11-18 NOTE — Progress Notes (Signed)
SLP Cancellation Note  Patient Details Name: DAVELLE ANSELMI MRN: [id number] DOB: 04-29-1946   Cancelled treatment:       Reason Eval/Treat Not Completed: Patient at procedure or test/unavailable   Osmani Kersten, Katherene Ponto 11/18/2022, 1:15 PM

## 2022-11-18 NOTE — Inpatient Diabetes Management (Signed)
Inpatient Diabetes Program Recommendations  AACE/ADA: New Consensus Statement on Inpatient Glycemic Control (2015)  Target Ranges:  Prepandial:   less than 140 mg/dL      Peak postprandial:   less than 180 mg/dL (1-2 hours)      Critically ill patients:  140 - 180 mg/dL   Lab Results  Component Value Date   GLUCAP 145 (H) 11/18/2022   HGBA1C 7.9 (H) 11/16/2022    Review of Glycemic Control  Latest Reference Range & Units 11/18/22 05:23 11/18/22 07:48 11/18/22 07:51 11/18/22 11:05  Glucose-Capillary 70 - 99 mg/dL 142 (H) 411 (H) 140 (H) 145 (H)  (H): Data is abnormally high Diabetes history: Type 2 DM Outpatient Diabetes medications: Januvia 100 mg QD, Levemir 20 units QD, Metformin 1000 mg QD Current orders for Inpatient glycemic control: Metformin 1000 mg BID, Levemir 10 units BID, Novolog 0-15 units TID & HS  Inpatient Diabetes Program Recommendations:    Spoke with patient regarding outpatient diabetes management.  Reviewed patient's current A1c of 7.9%. Explained what a A1c is and what it measures. Also reviewed goal A1c with patient, importance of good glucose control @ home, and blood sugar goals. Reviewed current glucose trends, verified home medications, survival skills, interventions, vascular changes and commorbidities.  Patient has a meter and testing supplies. Currently uses Freestyle libre 2. Plans to follow up with PCP in the next two weeks.  Encouraged to continue to be mindful with CHO intake. Reports rarely consumes sugary beverages and does the cooking in the home. Reviewed plate method and importance of protein.  Patient has no further questions at this time.   Thanks, Bronson Curb, MSN, RNC-OB Diabetes Coordinator [phone] (8a-5p)

## 2022-11-18 NOTE — Progress Notes (Signed)
ANTICOAGULATION CONSULT NOTE  Pharmacy Consult for heparin Indication: stroke  Allergies  Allergen Reactions   Demerol Other (See Comments)    hallucinations    Patient Measurements: Weight: 81.2 kg (179 lb) Heparin Dosing Weight: 81kg  Vital Signs: Temp: 98.6 F (37 C) (02/05 0749) Temp Source: Oral (02/05 0749) BP: 161/91 (02/05 0700) Pulse Rate: 63 (02/05 0700)  Labs: Recent Labs    11/16/22 0402 11/16/22 0415 11/16/22 0515 11/16/22 1159 11/17/22 0348 11/17/22 0956 11/17/22 1806 11/18/22 0342  HGB 12.1* 11.9*  --  14.3 15.3  --   --  15.1  HCT 35.5* 35.0*  --  41.2 45.3  --   --  43.8  PLT 119*  --   --  152 147*  --   --  147*  APTT  --  26  --   --   --   --   --   --   LABPROT  --  14.1  --   --   --   --   --   --   INR  --  1.1  --   --   --   --   --   --   HEPARINUNFRC  --   --   --   --   --  0.37 0.45 0.45  CREATININE 1.20 1.10  --   --  1.15  --   --   --   TROPONINIHS 6  --  9  --   --   --   --   --      Estimated Creatinine Clearance: 58.1 mL/min (by C-G formula based on SCr of 1.15 mg/dL).   Medical History: Past Medical History:  Diagnosis Date   Alcohol abuse, in remission    Arthritis    Bleeding per rectum    BPH (benign prostatic hyperplasia)    CAD (coronary artery disease)    a. s/p CABG 1996  b. LHC in 11/2013 w/ patent grafts. c. 12/19/14 re-look cath with patent grafts and good LVF   Erectile dysfunction    GERD (gastroesophageal reflux disease)    History of lower GI bleeding    HLD (hyperlipidemia)    HTN (hypertension)    Prostate cancer ([place])    a. s/p radiation    S/P angioplasty with stent-DES ostial VG to PDA and DES ostial VG to diag. 08/27/19 08/28/2019   Type II diabetes mellitus (HCC)     Medications:  Facility-Administered Medications Prior to Admission  Medication Dose Route Frequency Provider Last Rate Last Admin   sodium chloride flush (NS) 0.9 % injection 3 mL  3 mL Intravenous Q12H [place], Peter M, MD        Medications Prior to Admission  Medication Sig Dispense Refill Last Dose   acetaminophen (TYLENOL) 650 MG CR tablet Take 650-1,300 mg by mouth every 8 (eight) hours as needed for pain.   11/16/2022 at 0130   ALPRAZolam (XANAX) 0.25 MG tablet Take 0.25 mg by mouth 2 (two) times daily as needed for anxiety.   Past Month   amLODipine (NORVASC) 10 MG tablet Take 1 tablet (10 mg total) by mouth every morning. 90 tablet 3 11/15/2022 at am   ferrous sulfate 325 (65 FE) MG tablet Take 1 tablet (325 mg total) by mouth daily. 30 tablet 3 11/15/2022 at am   insulin detemir (LEVEMIR FLEXTOUCH) 100 UNIT/ML FlexPen Inject 20 Units into the skin daily as needed (CBG >130).   11/15/2022 at  am   isosorbide mononitrate (IMDUR) 120 MG 24 hr tablet TAKE 1 TABLET (120 MG TOTAL) BY MOUTH DAILY. 90 tablet 3 11/15/2022 at am   metFORMIN (GLUCOPHAGE) 1000 MG tablet Take 1,000 mg by mouth daily.   11/15/2022   metoprolol succinate (TOPROL-XL) 50 MG 24 hr tablet Take 1 tablet (50 mg total) by mouth daily. 90 tablet 3 11/15/2022 at 0800   Multiple Vitamin (MULTIVITAMIN WITH MINERALS) TABS Take 1 tablet by mouth every morning.   11/15/2022   nitroGLYCERIN (NITROSTAT) 0.4 MG SL tablet PLACE 1 TABLET UNDER THE TONGUE EVERY 5 MINUTES FOR 3 DOSES AS NEEDED FOR CHEST PAIN (Patient taking differently: Place 0.4 mg under the tongue every 5 (five) minutes as needed for chest pain.) 25 tablet 3 11/16/2022   prasugrel (EFFIENT) 10 MG TABS tablet Take 1 tablet (10 mg total) by mouth daily. 90 tablet 3 11/15/2022 at 0800   rosuvastatin (CRESTOR) 40 MG tablet TAKE 1 TABLET (40 MG TOTAL) BY MOUTH DAILY. 90 tablet 3 11/15/2022 at am   sitaGLIPtin (JANUVIA) 100 MG tablet Take 100 mg by mouth every morning.   11/15/2022 at am   traMADol (ULTRAM) 50 MG tablet Take 50 mg by mouth 2 (two) times daily as needed for pain.   Past Week   Continuous Blood Gluc Receiver (FREESTYLE LIBRE 2 READER) DEVI See admin instructions.      Continuous Blood Gluc Sensor (FREESTYLE  LIBRE 2 SENSOR) MISC See admin instructions.      Insulin Pen Needle (BD PEN NEEDLE NANO U/F) 32G X 4 MM MISC 1 pen by Does not apply route daily. 30 each 11    Scheduled:   amLODipine  10 mg Oral q morning   Chlorhexidine Gluconate Cloth  6 each Topical Daily   insulin aspart  0-15 Units Subcutaneous TID WC   insulin aspart  0-5 Units Subcutaneous QHS   insulin detemir  10 Units Subcutaneous BID   metFORMIN  1,000 mg Oral BID WC   pantoprazole (PROTONIX) IV  40 mg Intravenous QHS   rosuvastatin  40 mg Oral Daily   Infusions:   heparin 1,100 Units/hr (11/18/22 0700)    Assessment: Pt is s/p CVA with TNK on 2/3. Plan for arteriogram on Monday. Start heparin per neurology with low goal.  Heparin level remains therapeutic at 0.45. Hgb and plt stable. No bleed issues reported.  Goal of Therapy:  Heparin level 0.3-0.5 units/ml Monitor platelets by anticoagulation protocol: Yes   Plan:  Continue heparin drip at 1100 units/hr Monitor daily heparin level and CBC, s/sx bleeding   Alanda Slim, PharmD, [facility] Clinical Pharmacist Please see AMION for all Pharmacists' Contact Phone Numbers 11/18/2022, 8:06 AM

## 2022-11-19 ENCOUNTER — Other Ambulatory Visit (HOSPITAL_COMMUNITY): Payer: Self-pay

## 2022-11-19 DIAGNOSIS — I63111 Cerebral infarction due to embolism of right vertebral artery: Secondary | ICD-10-CM | POA: Diagnosis not present

## 2022-11-19 LAB — BASIC METABOLIC PANEL
Anion gap: 8 (ref 5–15)
BUN: 15 mg/dL (ref 8–23)
CO2: 23 mmol/L (ref 22–32)
Calcium: 8.7 mg/dL — ABNORMAL LOW (ref 8.9–10.3)
Chloride: 106 mmol/L (ref 98–111)
Creatinine, Ser: 1.35 mg/dL — ABNORMAL HIGH (ref 0.61–1.24)
GFR, Estimated: 55 mL/min — ABNORMAL LOW (ref >60)
Glucose, Bld: 156 mg/dL — ABNORMAL HIGH (ref 70–99)
Potassium: 3.5 mmol/L (ref 3.5–5.1)
Sodium: 137 mmol/L (ref 135–145)

## 2022-11-19 LAB — CBC WITH DIFFERENTIAL/PLATELET
Abs Immature Granulocytes: 0.03 10*3/uL (ref 0.00–0.07)
Basophils Absolute: 0 10*3/uL (ref 0.0–0.1)
Basophils Relative: 0 %
Eosinophils Absolute: 0.3 10*3/uL (ref 0.0–0.5)
Eosinophils Relative: 4 %
HCT: 43.1 % (ref 39.0–52.0)
Hemoglobin: 15.1 g/dL (ref 13.0–17.0)
Immature Granulocytes: 0 %
Lymphocytes Relative: 20 %
Lymphs Abs: 1.4 10*3/uL (ref 0.7–4.0)
MCH: 29.8 pg (ref 26.0–34.0)
MCHC: 35 g/dL (ref 30.0–36.0)
MCV: 85 fL (ref 80.0–100.0)
Monocytes Absolute: 0.6 10*3/uL (ref 0.1–1.0)
Monocytes Relative: 8 %
Neutro Abs: 4.8 10*3/uL (ref 1.7–7.7)
Neutrophils Relative %: 68 %
Platelets: 142 10*3/uL — ABNORMAL LOW (ref 150–400)
RBC: 5.07 MIL/uL (ref 4.22–5.81)
RDW: 15.3 % (ref 11.5–15.5)
WBC: 7.1 10*3/uL (ref 4.0–10.5)
nRBC: 0 % (ref 0.0–0.2)

## 2022-11-19 LAB — GLUCOSE, CAPILLARY
Glucose-Capillary: 191 mg/dL — ABNORMAL HIGH (ref 70–99)
Glucose-Capillary: 203 mg/dL — ABNORMAL HIGH (ref 70–99)

## 2022-11-19 MED ORDER — ASPIRIN 81 MG PO TBEC
81.0000 mg | DELAYED_RELEASE_TABLET | Freq: Every day | ORAL | 12 refills | Status: AC
  Filled 2022-11-19: qty 30, 30d supply, fill #0

## 2022-11-19 MED ORDER — PANTOPRAZOLE SODIUM 40 MG PO TBEC: 40.0000 mg | DELAYED_RELEASE_TABLET | Freq: Every day | ORAL | Status: DC

## 2022-11-19 MED ORDER — CLOPIDOGREL BISULFATE 75 MG PO TABS
75.0000 mg | ORAL_TABLET | Freq: Every day | ORAL | 1 refills | Status: DC
  Filled 2022-11-19: qty 30, 30d supply, fill #0

## 2022-11-19 NOTE — Progress Notes (Signed)
SLP Cancellation Note  Patient Details Name: Antonio [name] MRN: [id number] DOB: 29-Sep-1946   Cancelled treatment:        Attempted to see pt for cognitive linguistic assessment.  Spoke with treatment team.  Pt with imminent discharge and has no ST needs at this time.  SLP will sign off.    Celedonio Savage, MA, [place] Office: [phone] 11/19/2022, 10:03 AM

## 2022-11-19 NOTE — Discharge Instructions (Addendum)
Continue ASA and Plavix x3 weeks and then plavix alone. Stop effient.

## 2022-11-19 NOTE — Discharge Summary (Addendum)
Stroke Discharge Summary  Patient ID: Antonio [name]   MRN: [id number]      DOB: 03-04-1946  Date of Admission: 11/16/2022 Date of Discharge: 11/19/2022  Attending Physician:  Janeann Merl MD Consultant(s):    None  Patient's PCP:  [name], MD  DISCHARGE DIAGNOSIS:  Principal Problem:   Cerebrovascular accident (CVA) due to embolism of right vertebral artery ([place])   Allergies as of 11/19/2022       Reactions   Demerol Other (See Comments)   hallucinations        Medication List     STOP taking these medications    prasugrel 10 MG Tabs tablet Commonly known as: EFFIENT       TAKE these medications    acetaminophen 650 MG CR tablet Commonly known as: TYLENOL Take 650-1,300 mg by mouth every 8 (eight) hours as needed for pain.   ALPRAZolam 0.25 MG tablet Commonly known as: XANAX Take 0.25 mg by mouth 2 (two) times daily as needed for anxiety.   amLODipine 10 MG tablet Commonly known as: NORVASC Take 1 tablet (10 mg total) by mouth every morning.   Aspirin Low Dose 81 MG tablet Generic drug: aspirin EC Take 1 tablet (81 mg total) by mouth daily. Swallow whole. Start taking on: November 20, 2022   clopidogrel 75 MG tablet Commonly known as: PLAVIX Take 1 tablet (75 mg total) by mouth daily. Start taking on: November 20, 2022   ferrous sulfate 325 (65 FE) MG tablet Take 1 tablet (325 mg total) by mouth daily.   FreeStyle [place] 2 Reader Kerrin Mo See admin instructions.   FreeStyle [employer] 2 Sensor Misc See admin instructions.   Insulin Pen Needle 32G X 4 MM Misc Commonly known as: BD Pen Needle Nano U/F 1 pen by Does not apply route daily.   isosorbide mononitrate 120 MG 24 hr tablet Commonly known as: IMDUR TAKE 1 TABLET (120 MG TOTAL) BY MOUTH DAILY.   Levemir FlexTouch 100 UNIT/ML FlexPen Generic drug: insulin detemir Inject 20 Units into the skin daily as needed (CBG >130).   metFORMIN 1000 MG tablet Commonly known as:  GLUCOPHAGE Take 1,000 mg by mouth daily.   metoprolol succinate 50 MG 24 hr tablet Commonly known as: TOPROL-XL Take 1 tablet (50 mg total) by mouth daily.   multivitamin with minerals Tabs tablet Take 1 tablet by mouth every morning.   nitroGLYCERIN 0.4 MG SL tablet Commonly known as: NITROSTAT PLACE 1 TABLET UNDER THE TONGUE EVERY 5 MINUTES FOR 3 DOSES AS NEEDED FOR CHEST PAIN What changed: See the new instructions.   rosuvastatin 40 MG tablet Commonly known as: CRESTOR TAKE 1 TABLET (40 MG TOTAL) BY MOUTH DAILY.   sitaGLIPtin 100 MG tablet Commonly known as: JANUVIA Take 100 mg by mouth every morning.   traMADol 50 MG tablet Commonly known as: ULTRAM Take 50 mg by mouth 2 (two) times daily as needed for pain.        LABORATORY STUDIES CBC    Component Value Date/Time   WBC 7.1 11/19/2022 0435   RBC 5.07 11/19/2022 0435   HGB 15.1 11/19/2022 0435   HGB 9.8 (L) 09/10/2021 1631   HCT 43.1 11/19/2022 0435   HCT 32.1 (L) 09/10/2021 1631   PLT 142 (L) 11/19/2022 0435   PLT 170 09/10/2021 1631   MCV 85.0 11/19/2022 0435   MCV 84 09/10/2021 1631   MCH 29.8 11/19/2022 0435   MCHC 35.0 11/19/2022 0435  RDW 15.3 11/19/2022 0435   RDW 17.0 (H) 09/10/2021 1631   LYMPHSABS 1.4 11/19/2022 0435   LYMPHSABS 1.7 08/04/2020 1642   MONOABS 0.6 11/19/2022 0435   EOSABS 0.3 11/19/2022 0435   EOSABS 0.1 08/04/2020 1642   BASOSABS 0.0 11/19/2022 0435   BASOSABS 0.0 08/04/2020 1642   CMP    Component Value Date/Time   NA 137 11/19/2022 0435   NA 142 09/10/2021 1631   K 3.5 11/19/2022 0435   CL 106 11/19/2022 0435   CO2 23 11/19/2022 0435   GLUCOSE 156 (H) 11/19/2022 0435   BUN 15 11/19/2022 0435   BUN 14 09/10/2021 1631   CREATININE 1.35 (H) 11/19/2022 0435   CALCIUM 8.7 (L) 11/19/2022 0435   PROT 5.4 (L) 11/16/2022 0402   PROT 7.1 08/17/2019 1109   ALBUMIN 2.9 (L) 11/16/2022 0402   ALBUMIN 4.3 08/17/2019 1109   AST 17 11/16/2022 0402   ALT 13 11/16/2022 0402    ALKPHOS 38 11/16/2022 0402   BILITOT 0.2 (L) 11/16/2022 0402   BILITOT 0.4 08/17/2019 1109   GFRNONAA 55 (L) 11/19/2022 0435   GFRAA 65 08/04/2020 1642   COAGS Lab Results  Component Value Date   INR 1.1 11/16/2022   INR 1.0 08/04/2020   INR 1.1 07/16/2020   Lipid Panel    Component Value Date/Time   CHOL 133 11/17/2022 0348   CHOL 124 08/17/2019 1109   TRIG 73 11/17/2022 0348   HDL 41 11/17/2022 0348   HDL 41 08/17/2019 1109   CHOLHDL 3.2 11/17/2022 0348   VLDL 15 11/17/2022 0348   LDLCALC 77 11/17/2022 0348   LDLCALC 68 08/17/2019 1109   HgbA1C  Lab Results  Component Value Date   HGBA1C 7.9 (H) 11/16/2022   Urinalysis    Component Value Date/Time   COLORURINE STRAW (A) 11/16/2022 0413   APPEARANCEUR CLEAR 11/16/2022 0413   LABSPEC 1.032 (H) 11/16/2022 0413   PHURINE 7.0 11/16/2022 0413   GLUCOSEU 50 (A) 11/16/2022 0413   GLUCOSEU NEGATIVE 05/27/2014 1659   HGBUR NEGATIVE 11/16/2022 0413   BILIRUBINUR NEGATIVE 11/16/2022 0413   KETONESUR NEGATIVE 11/16/2022 0413   PROTEINUR NEGATIVE 11/16/2022 0413   UROBILINOGEN 0.2 05/27/2014 1659   NITRITE NEGATIVE 11/16/2022 0413   LEUKOCYTESUR NEGATIVE 11/16/2022 0413   Urine Drug Screen     Component Value Date/Time   LABOPIA NONE DETECTED 11/16/2022 0413   COCAINSCRNUR NONE DETECTED 11/16/2022 0413   LABBENZ NONE DETECTED 11/16/2022 0413   AMPHETMU NONE DETECTED 11/16/2022 0413   THCU NONE DETECTED 11/16/2022 0413   LABBARB NONE DETECTED 11/16/2022 0413    Alcohol Level    Component Value Date/Time   ETH <10 11/16/2022 0415     SIGNIFICANT DIAGNOSTIC STUDIES IR ANGIO INTRA EXTRACRAN SEL COM CAROTID INNOMINATE BILAT MOD SED  Result Date: 11/19/2022 CLINICAL DATA:  History of acute onset of right-sided weakness, decreased sensation and vertigo improving. 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 EXAM: BILATERAL COMMON CAROTID  AND INNOMINATE ANGIOGRAPHY COMPARISON:  CTA of the head and neck November 16, 2022 MEDICATIONS: Heparin 2000 units IV. No antibiotic was administered within 1 hour of the procedure. ANESTHESIA/SEDATION: Versed 1 mg IV; Fentanyl 25 mcg IV Moderate Sedation Time:  34 minutes The patient was continuously monitored during the procedure by the interventional radiology nurse under my direct supervision. CONTRAST:  Omnipaque 300 65 mL. FLUOROSCOPY TIME:  Fluoroscopy Time: 10 minutes 36 seconds (1277 mGy).  COMPLICATIONS: None immediate. TECHNIQUE: Informed written consent was obtained from the patient after a thorough discussion of the procedural risks, benefits and alternatives. All questions were addressed. Maximal Sterile Barrier Technique was utilized including caps, mask, sterile gowns, sterile gloves, sterile drape, hand hygiene and skin antiseptic. A timeout was performed prior to the initiation of the procedure. The right forearm to the wrist was prepped and draped in the usual sterile manner. The right radial artery was then identified with ultrasound, and its morphology documented in the radiology PACS system. A dorsal palmar anastomosis was verified to be present. Using ultrasound guidance access into the right radial artery was obtained over an 018 inch micro guidewire with a 4/5 French radial sheath. The obturator, and the micro guidewire were removed. Good aspiration was obtained from the side port of the radial sheath. A cocktail of 2000 units of heparin, 2.5 mg of verapamil and 200 mcg of nitroglycerin was then infused in diluted form without event. A right radial arteriogram was then performed. Over an 035 inch Roadrunner guidewire, a Simmons 2 5 [place] diagnostic catheter was advanced to the aortic arch region, and selectively positioned at the orifice of the right vertebral artery, the left common carotid artery, the right common carotid artery and the left vertebral artery. A wrist band was applied for  hemostasis at the right radial puncture site. Distal right radial pulse was verified to be present. FINDINGS: Dominant right vertebral artery at its origin demonstrates patency. Distal to this the vessel is completely occluded. There is partial reconstitution at the level of C2 from collaterals arising from the ascending cervical branch of the thyrocervical trunk. Slow ascent of contrast is noted distal to this to the level of the occipital bone. The left common carotid arteriogram demonstrates the left external carotid artery and its major branches to be widely patent. The left internal carotid artery at the bulb demonstrates a smooth atherosclerotic plaque along the posterior wall of the bulb. No evidence of intraluminal filling defects, or significant stenosis is evident. The vessel is seen to opacify to the cranial skull base. The petrous, the cavernous and the supraclinoid left ICA demonstrate wide patency. Atherosclerotic disease is evident in the proximal supraclinoid left ICA. The left middle cerebral artery and the left anterior cerebral artery opacify into the capillary and venous phases. The right common carotid arteriogram demonstrates the right external carotid artery and its major branches to be widely patent. The right internal carotid artery at the bulb demonstrates a focal ulceration along its lateral aspect. Mild tortuosity of the proximal 1/3 of the right internal carotid artery is seen. More distally, the vessel is seen to opacify to the cranial skull base. Patency is seen of the petrous, the cavernous and the supraclinoid right ICA. The right middle cerebral artery and the right anterior cerebral artery opacify into capillary and venous phases. The non dominant left vertebral artery is widely patent proximally. There is mild atherosclerotic narrowing of the proximal 1/3. More distally, the vessel is seen to opacify to the cranial skull base. The posterior cerebral arteries, the superior  cerebellar arteries and the anterior-inferior cerebellar arteries demonstrate patency. There is retrograde opacification of the right vertebrobasilar junction to the level of the right posterior-inferior cerebellar artery. Left anterior-inferior cerebellar artery/posterior-inferior cerebellar artery developmental variation is noted. IMPRESSION: 1. Occluded dominant right vertebral artery proximally, with partial reconstitution at the level of C1-C2 with reocclusion at the level of the occipital bone. Findings most consistent with a dissection. 2. Retrograde  opacification of the right vertebrobasilar junction from the left vertebral artery injection, to the level of the right posterior-inferior cerebellar artery. PLAN: Follow-up CT angiogram of the head and neck in 6 to 8 weeks. Electronically Signed   By: Luanne Bras M.D.   On: 11/19/2022 08:16   IR US Guide Vasc Access Right  Result Date: 11/19/2022 CLINICAL DATA:  History of acute onset of right-sided weakness, decreased sensation and vertigo improving. 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 EXAM: BILATERAL COMMON CAROTID AND INNOMINATE ANGIOGRAPHY COMPARISON:  CTA of the head and neck November 16, 2022 MEDICATIONS: Heparin 2000 units IV. No antibiotic was administered within 1 hour of the procedure. ANESTHESIA/SEDATION: Versed 1 mg IV; Fentanyl 25 mcg IV Moderate Sedation Time:  34 minutes The patient was continuously monitored during the procedure by the interventional radiology nurse under my direct supervision. CONTRAST:  Omnipaque 300 65 mL. FLUOROSCOPY TIME:  Fluoroscopy Time: 10 minutes 36 seconds (1277 mGy). COMPLICATIONS: None immediate. TECHNIQUE: Informed written consent was obtained from the patient after a thorough discussion of the procedural risks, benefits and alternatives. All questions were addressed. Maximal Sterile Barrier Technique was utilized  including caps, mask, sterile gowns, sterile gloves, sterile drape, hand hygiene and skin antiseptic. A timeout was performed prior to the initiation of the procedure. The right forearm to the wrist was prepped and draped in the usual sterile manner. The right radial artery was then identified with ultrasound, and its morphology documented in the radiology PACS system. A dorsal palmar anastomosis was verified to be present. Using ultrasound guidance access into the right radial artery was obtained over an 018 inch micro guidewire with a 4/5 French radial sheath. The obturator, and the micro guidewire were removed. Good aspiration was obtained from the side port of the radial sheath. A cocktail of 2000 units of heparin, 2.5 mg of verapamil and 200 mcg of nitroglycerin was then infused in diluted form without event. A right radial arteriogram was then performed. Over an 035 inch Roadrunner guidewire, a Simmons 2 5 [place] diagnostic catheter was advanced to the aortic arch region, and selectively positioned at the orifice of the right vertebral artery, the left common carotid artery, the right common carotid artery and the left vertebral artery. A wrist band was applied for hemostasis at the right radial puncture site. Distal right radial pulse was verified to be present. FINDINGS: Dominant right vertebral artery at its origin demonstrates patency. Distal to this the vessel is completely occluded. There is partial reconstitution at the level of C2 from collaterals arising from the ascending cervical branch of the thyrocervical trunk. Slow ascent of contrast is noted distal to this to the level of the occipital bone. The left common carotid arteriogram demonstrates the left external carotid artery and its major branches to be widely patent. The left internal carotid artery at the bulb demonstrates a smooth atherosclerotic plaque along the posterior wall of the bulb. No evidence of intraluminal filling defects, or  significant stenosis is evident. The vessel is seen to opacify to the cranial skull base. The petrous, the cavernous and the supraclinoid left ICA demonstrate wide patency. Atherosclerotic disease is evident in the proximal supraclinoid left ICA. The left middle cerebral artery and the left anterior cerebral artery opacify into the capillary and venous phases. The right common carotid arteriogram demonstrates the right external carotid artery and its major branches to be widely patent. The right internal carotid  artery at the bulb demonstrates a focal ulceration along its lateral aspect. Mild tortuosity of the proximal 1/3 of the right internal carotid artery is seen. More distally, the vessel is seen to opacify to the cranial skull base. Patency is seen of the petrous, the cavernous and the supraclinoid right ICA. The right middle cerebral artery and the right anterior cerebral artery opacify into capillary and venous phases. The non dominant left vertebral artery is widely patent proximally. There is mild atherosclerotic narrowing of the proximal 1/3. More distally, the vessel is seen to opacify to the cranial skull base. The posterior cerebral arteries, the superior cerebellar arteries and the anterior-inferior cerebellar arteries demonstrate patency. There is retrograde opacification of the right vertebrobasilar junction to the level of the right posterior-inferior cerebellar artery. Left anterior-inferior cerebellar artery/posterior-inferior cerebellar artery developmental variation is noted. IMPRESSION: 1. Occluded dominant right vertebral artery proximally, with partial reconstitution at the level of C1-C2 with reocclusion at the level of the occipital bone. Findings most consistent with a dissection. 2. Retrograde opacification of the right vertebrobasilar junction from the left vertebral artery injection, to the level of the right posterior-inferior cerebellar artery. PLAN: Follow-up CT angiogram of the  head and neck in 6 to 8 weeks. Electronically Signed   By: Luanne Bras M.D.   On: 11/19/2022 08:16   IR ANGIO VERTEBRAL SEL SUBCLAVIAN INNOMINATE BILAT MOD SED  Result Date: 11/19/2022 CLINICAL DATA:  History of acute onset of right-sided weakness, decreased sensation and vertigo improving. 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 EXAM: BILATERAL COMMON CAROTID AND INNOMINATE ANGIOGRAPHY COMPARISON:  CTA of the head and neck November 16, 2022 MEDICATIONS: Heparin 2000 units IV. No antibiotic was administered within 1 hour of the procedure. ANESTHESIA/SEDATION: Versed 1 mg IV; Fentanyl 25 mcg IV Moderate Sedation Time:  34 minutes The patient was continuously monitored during the procedure by the interventional radiology nurse under my direct supervision. CONTRAST:  Omnipaque 300 65 mL. FLUOROSCOPY TIME:  Fluoroscopy Time: 10 minutes 36 seconds (1277 mGy). COMPLICATIONS: None immediate. TECHNIQUE: Informed written consent was obtained from the patient after a thorough discussion of the procedural risks, benefits and alternatives. All questions were addressed. Maximal Sterile Barrier Technique was utilized including caps, mask, sterile gowns, sterile gloves, sterile drape, hand hygiene and skin antiseptic. A timeout was performed prior to the initiation of the procedure. The right forearm to the wrist was prepped and draped in the usual sterile manner. The right radial artery was then identified with ultrasound, and its morphology documented in the radiology PACS system. A dorsal palmar anastomosis was verified to be present. Using ultrasound guidance access into the right radial artery was obtained over an 018 inch micro guidewire with a 4/5 French radial sheath. The obturator, and the micro guidewire were removed. Good aspiration was obtained from the side port of the radial sheath. A cocktail of 2000 units of heparin,  2.5 mg of verapamil and 200 mcg of nitroglycerin was then infused in diluted form without event. A right radial arteriogram was then performed. Over an 035 inch Roadrunner guidewire, a Simmons 2 5 [place] diagnostic catheter was advanced to the aortic arch region, and selectively positioned at the orifice of the right vertebral artery, the left common carotid artery, the right common carotid artery and the left vertebral artery. A wrist band was applied for hemostasis at the right radial puncture site. Distal right radial pulse was verified to be  present. FINDINGS: Dominant right vertebral artery at its origin demonstrates patency. Distal to this the vessel is completely occluded. There is partial reconstitution at the level of C2 from collaterals arising from the ascending cervical branch of the thyrocervical trunk. Slow ascent of contrast is noted distal to this to the level of the occipital bone. The left common carotid arteriogram demonstrates the left external carotid artery and its major branches to be widely patent. The left internal carotid artery at the bulb demonstrates a smooth atherosclerotic plaque along the posterior wall of the bulb. No evidence of intraluminal filling defects, or significant stenosis is evident. The vessel is seen to opacify to the cranial skull base. The petrous, the cavernous and the supraclinoid left ICA demonstrate wide patency. Atherosclerotic disease is evident in the proximal supraclinoid left ICA. The left middle cerebral artery and the left anterior cerebral artery opacify into the capillary and venous phases. The right common carotid arteriogram demonstrates the right external carotid artery and its major branches to be widely patent. The right internal carotid artery at the bulb demonstrates a focal ulceration along its lateral aspect. Mild tortuosity of the proximal 1/3 of the right internal carotid artery is seen. More distally, the vessel is seen to opacify to the  cranial skull base. Patency is seen of the petrous, the cavernous and the supraclinoid right ICA. The right middle cerebral artery and the right anterior cerebral artery opacify into capillary and venous phases. The non dominant left vertebral artery is widely patent proximally. There is mild atherosclerotic narrowing of the proximal 1/3. More distally, the vessel is seen to opacify to the cranial skull base. The posterior cerebral arteries, the superior cerebellar arteries and the anterior-inferior cerebellar arteries demonstrate patency. There is retrograde opacification of the right vertebrobasilar junction to the level of the right posterior-inferior cerebellar artery. Left anterior-inferior cerebellar artery/posterior-inferior cerebellar artery developmental variation is noted. IMPRESSION: 1. Occluded dominant right vertebral artery proximally, with partial reconstitution at the level of C1-C2 with reocclusion at the level of the occipital bone. Findings most consistent with a dissection. 2. Retrograde opacification of the right vertebrobasilar junction from the left vertebral artery injection, to the level of the right posterior-inferior cerebellar artery. PLAN: Follow-up CT angiogram of the head and neck in 6 to 8 weeks. Electronically Signed   By: Luanne Bras M.D.   On: 11/19/2022 08:16   MR BRAIN WO CONTRAST  Result Date: 11/17/2022 CLINICAL DATA:  Stroke follow-up EXAM: MRI HEAD WITHOUT CONTRAST TECHNIQUE: Multiplanar, multiecho pulse sequences of the brain and surrounding structures were obtained without intravenous contrast. COMPARISON:  None Available. FINDINGS: Brain: Punctate ischemic focus in the paramedian right cerebellar hemisphere. No other diffusion abnormality. No chronic microhemorrhage or siderosis. There is multifocal hyperintense T2-weighted signal within the white matter. Parenchymal volume and CSF spaces are normal. The midline structures are normal. Vascular: Normal flow voids.  Skull and upper cervical spine: Normal marrow signal. Sinuses/Orbits: Negative. Other: None. IMPRESSION: 1. Punctate ischemic focus in the paramedian right cerebellar hemisphere. No hemorrhage or mass effect. 2. Findings of chronic small vessel ischemia. Electronically Signed   By: Ulyses Jarred M.D.   On: 11/17/2022 00:21   ECHOCARDIOGRAM COMPLETE  Result Date: 11/16/2022    ECHOCARDIOGRAM REPORT   Patient Name:   Antonio [name] Date of Exam: 11/16/2022 Medical Rec #:  [id number]        Height:       67.5 in Accession #:    [id number]  Weight:       179.0 lb Date of Birth:  [birth year]       BSA:          1.940 m Patient Age:    77 years         BP:           110/71 mmHg Patient Gender: M                HR:           73 bpm. Exam Location:  Inpatient Procedure: 2D Echo, Cardiac Doppler, Color Doppler and Intracardiac            Opacification Agent Indications:    I63.9 Stroke  History:        Patient has prior history of Echocardiogram examinations, most                 recent 07/20/2021. CAD, Prior CABG, Stroke and CKD,                 Signs/Symptoms:Chest Pain; Risk Factors:Former Smoker,                 Hypertension, Dyslipidemia and Diabetes.  Sonographer:    Wilkie Aye RVT RCS Referring Phys: [id number] [place]  Sonographer Comments: Suboptimal apical window. IMPRESSIONS  1. No apical thrombus with Definity contrast. Left ventricular ejection fraction, by estimation, is 70 to 75%. Left ventricular ejection fraction by PLAX is 74 %. The left ventricle has hyperdynamic function. The left ventricle has no regional wall motion abnormalities. There is mild left ventricular hypertrophy. Left ventricular diastolic parameters are consistent with Grade I diastolic dysfunction (impaired relaxation).  2. Right ventricular systolic function is mildly reduced. The right ventricular size is normal. There is mildly elevated pulmonary artery systolic pressure. The estimated right ventricular systolic pressure is  52.8 mmHg.  3. The mitral valve is abnormal. Mild mitral valve regurgitation.  4. The aortic valve is tricuspid. Aortic valve regurgitation is not visualized.  5. The inferior vena cava is normal in size with <50% respiratory variability, suggesting right atrial pressure of 8 mmHg. Comparison(s): Changes from prior study are noted. 07/20/2021: LVEF 55-60%. FINDINGS  Left Ventricle: No apical thrombus with Definity contrast. Left ventricular ejection fraction, by estimation, is 70 to 75%. Left ventricular ejection fraction by PLAX is 74 %. The left ventricle has hyperdynamic function. The left ventricle has no regional wall motion abnormalities. The left ventricular internal cavity size was normal in size. There is mild left ventricular hypertrophy. Left ventricular diastolic parameters are consistent with Grade I diastolic dysfunction (impaired relaxation). Indeterminate filling pressures. Right Ventricle: The right ventricular size is normal. No increase in right ventricular wall thickness. Right ventricular systolic function is mildly reduced. There is mildly elevated pulmonary artery systolic pressure. The tricuspid regurgitant velocity  is 2.74 m/s, and with an assumed right atrial pressure of 8 mmHg, the estimated right ventricular systolic pressure is 41.3 mmHg. Left Atrium: Left atrial size was normal in size. Right Atrium: Right atrial size was normal in size. Pericardium: There is no evidence of pericardial effusion. Mitral Valve: The mitral valve is abnormal. There is mild calcification of the mitral valve leaflet(s). Mild mitral annular calcification. Mild mitral valve regurgitation, with eccentric medially directed jet. Tricuspid Valve: The tricuspid valve is grossly normal. Tricuspid valve regurgitation is trivial. Aortic Valve: The aortic valve is tricuspid. Aortic valve regurgitation is not visualized. Aortic valve mean gradient measures 5.0 mmHg. Aortic valve peak  gradient measures 11.2 mmHg. Aortic  valve area, by VTI measures 2.08 cm. Pulmonic Valve: The pulmonic valve was grossly normal. Pulmonic valve regurgitation is trivial. Aorta: The aortic root and ascending aorta are structurally normal, with no evidence of dilitation. Venous: The inferior vena cava is normal in size with less than 50% respiratory variability, suggesting right atrial pressure of 8 mmHg. IAS/Shunts: No atrial level shunt detected by color flow Doppler.  LEFT VENTRICLE PLAX 2D LV EF:         Left            Diastology                ventricular     LV e' medial:    6.55 cm/s                ejection        LV E/e' medial:  12.2                fraction by     LV e' lateral:   10.90 cm/s                PLAX is 74      LV E/e' lateral: 7.3                %. LVIDd:         4.00 cm LVIDs:         2.30 cm LV PW:         1.20 cm LV IVS:        1.20 cm LVOT diam:     1.80 cm LV SV:         63 LV SV Index:   33 LVOT Area:     2.54 cm  RIGHT VENTRICLE            IVC RV Basal diam:  3.20 cm    IVC diam: 1.90 cm RV Mid diam:    2.20 cm RV S prime:     9.33 cm/s TAPSE (M-mode): 1.5 cm LEFT ATRIUM             Index        RIGHT ATRIUM           Index LA diam:        3.80 cm 1.96 cm/m   RA Area:     16.10 cm LA Vol (A2C):   53.8 ml 27.73 ml/m  RA Volume:   42.80 ml  22.06 ml/m LA Vol (A4C):   36.4 ml 18.76 ml/m LA Biplane Vol: 44.3 ml 22.84 ml/m  AORTIC VALVE                     PULMONIC VALVE AV Area (Vmax):    2.01 cm      PV Vmax:          1.27 m/s AV Area (Vmean):   2.12 cm      PV Peak grad:     6.4 mmHg AV Area (VTI):     2.08 cm      PR End Diast Vel: 3.79 msec AV Vmax:           167.00 cm/s AV Vmean:          104.000 cm/s AV VTI:            0.303 m AV Peak Grad:      11.2 mmHg AV Mean Grad:  5.0 mmHg LVOT Vmax:         132.00 cm/s LVOT Vmean:        86.800 cm/s LVOT VTI:          0.248 m LVOT/AV VTI ratio: 0.82  AORTA Ao Root diam: 3.20 cm Ao Asc diam:  3.40 cm Ao Arch diam: 2.5 cm MITRAL VALVE               TRICUSPID VALVE MV  Area (PHT): 2.92 cm    TR Peak grad:   30.0 mmHg MV Decel Time: 260 msec    TR Vmax:        274.00 cm/s MR Peak grad: 41.7 mmHg MR Mean grad: 26.0 mmHg    SHUNTS MR Vmax:      323.00 cm/s  Systemic VTI:  0.25 m MR Vmean:     239.0 cm/s   Systemic Diam: 1.80 cm MV E velocity: 79.80 cm/s MV A velocity: 81.60 cm/s MV E/A ratio:  0.98 Lyman Bishop MD Electronically signed by Lyman Bishop MD Signature Date/Time: 11/16/2022/12:53:47 PM    Final    CT ANGIO HEAD NECK W WO CM (CODE STROKE)  Result Date: 11/16/2022 CLINICAL DATA:  Worsening pain after T NK EXAM: CT ANGIOGRAPHY HEAD AND NECK TECHNIQUE: Multidetector CT imaging of the head and neck was performed using the standard protocol during bolus administration of intravenous contrast. Multiplanar CT image reconstructions and MIPs were obtained to evaluate the vascular anatomy. Carotid stenosis measurements (when applicable) are obtained utilizing NASCET criteria, using the distal internal carotid diameter as the denominator. RADIATION DOSE REDUCTION: This exam was performed according to the departmental dose-optimization program which includes automated exposure control, adjustment of the mA and/or kV according to patient size and/or use of iterative reconstruction technique. CONTRAST:  45m OMNIPAQUE IOHEXOL 350 MG/ML SOLN COMPARISON:  CTA from approximately 2 hours prior FINDINGS: CT HEAD FINDINGS Brain: No evidence of acute infarction, hemorrhage, hydrocephalus, extra-axial collection or mass lesion/mass effect. 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 Streak artifact limits posterior fossa assessment. Vascular: Not well assessed due to recent intravenous contrast administration Skull: Negative Sinuses/Orbits: Negative Review of the MIP images confirms the above findings CTA NECK FINDINGS Aortic arch: Stable and negative. Right carotid system: Stable atheromatous irregularity and plaque centered at the bifurcation. Left carotid system: Mild  atheromatous change at the bifurcation. Vertebral arteries: No proximal subclavian stenosis. Abrupt occlusion at the right V1 segment without vessel collapse. There is less intense enhancement of the downstream vertebral than before with tubular area of non enhancement that could be propagating clot. The distal right V4 segment and right PICA are still enhancing. Diffusely patent subclavian and left vertebral arteries without irregularity/beading. Skeleton: Generalized degeneration. C6-7 ACDF with solid arthrodesis. Other neck: No acute finding Upper chest: No acute finding Review of the MIP images confirms the above findings CTA HEAD FINDINGS Anterior circulation: Atheromatous calcification along the carotid siphons. No branch occlusion, beading, or aneurysm. Posterior circulation: Poor flow in the right vertebral artery due to disease in the neck with superimposed atheromatous plaque. The right vertebral is still enhancing, as is the right PICA. The left vertebral and basilar arteries are widely patent and smoothly contoured. No branch occlusion or aneurysm. Venous sinuses: Unremarkable Anatomic variants: None significant Review of the MIP images confirms the above findings IMPRESSION: 1. Known right proximal vertebral occlusion with worsened flow in the reconstituted distal segments although the distal right V4 segment and right PICA remain enhancing. The left vertebral and  basilar are widely patent with no visible downstream emboli. 2. No intracranial hemorrhage. Electronically Signed   By: Jorje Guild M.D.   On: 11/16/2022 06:03   CT Angio Chest/Abd/Pel for Dissection W and/or Wo Contrast  Result Date: 11/16/2022 CLINICAL DATA:  Acute aortic syndrome suspected.  Chest pain. EXAM: CT ANGIOGRAPHY CHEST, ABDOMEN AND PELVIS TECHNIQUE: Non-contrast CT of the chest was initially obtained. Multidetector CT imaging through the chest, abdomen and pelvis was performed using the standard protocol during bolus  administration of intravenous contrast. Multiplanar reconstructed images and MIPs were obtained and reviewed to evaluate the vascular anatomy. RADIATION DOSE REDUCTION: This exam was performed according to the departmental dose-optimization program which includes automated exposure control, adjustment of the mA and/or kV according to patient size and/or use of iterative reconstruction technique. CONTRAST:  100m OMNIPAQUE IOHEXOL 350 MG/ML SOLN, 755mOMNIPAQUE IOHEXOL 350 MG/ML SOLN COMPARISON:  07/13/2021. FINDINGS: CTA CHEST FINDINGS Cardiovascular: The heart is enlarged and there is no pericardial effusion. Multi-vessel coronary artery calcifications are seen. There is atherosclerotic calcification of the aorta without evidence of aneurysm or dissection. The pulmonary trunk is normal in caliber. Mediastinum/Nodes: No mediastinal, hilar, or axillary lymphadenopathy. The thyroid gland, trachea, and esophagus are within normal limits. There is a small hiatal hernia. Lungs/Pleura: Atelectasis is present bilaterally. No effusion or pneumothorax. Musculoskeletal: Sternotomy wires are noted. Degenerative changes are present in the thoracic spine. No acute osseous abnormality. Review of the MIP images confirms the above findings. CTA ABDOMEN AND PELVIS FINDINGS VASCULAR Aorta: Normal caliber aorta without aneurysm, dissection, vasculitis or significant stenosis. Aortic atherosclerosis. Celiac: Patent without evidence of aneurysm, dissection, vasculitis or significant stenosis. SMA: Patent without evidence of aneurysm, dissection, vasculitis or significant stenosis. Renals: Both renal arteries are patent without evidence of aneurysm, dissection, vasculitis, fibromuscular dysplasia or significant stenosis. IMA: Patent. Inflow: No evidence of aneurysm or dissection. There is atherosclerotic calcification and mural thrombus resulting in approximately 50% stenosis in the proximal external iliac artery on the left. Veins: No  obvious venous abnormality within the limitations of this arterial phase study. Review of the MIP images confirms the above findings. NON-VASCULAR Hepatobiliary: No focal liver abnormality is seen. No gallstones, gallbladder wall thickening, or biliary dilatation. Pancreas: Unremarkable. No pancreatic ductal dilatation or surrounding inflammatory changes. Spleen: Normal in size without focal abnormality. Adrenals/Urinary Tract: The adrenal glands are within normal limits. There is a cyst in the mid left kidney. No definite renal calculus. Examination is limited due to excreted contrast at the renal pyramids bilaterally. No hydronephrosis. Small bladder diverticula are present bilaterally. Stomach/Bowel: Stomach is within normal limits. Appendix appears normal. No evidence of bowel wall thickening, distention, or inflammatory changes. No free air or pneumatosis. There is diffuse colonic diverticulosis without diverticulitis. Lymphatic: No abdominal or pelvic lymphadenopathy. Reproductive: Prostate gland is normal in size. Radiopaque beads are noted in the prostate gland. Other: No abdominopelvic ascites. There is a fat containing inguinal hernia on the left. Musculoskeletal: Degenerative changes in the lumbar spine. No acute osseous abnormality. Review of the MIP images confirms the above findings. IMPRESSION: 1. No evidence of aortic aneurysm or dissection. 2. No acute process in the chest, abdomen, or pelvis. 3. Small hiatal hernia. 4. Colonic diverticulosis. 5. Bladder diverticula. 6. Cardiomegaly with coronary artery calcifications. Electronically Signed   By: LaBrett Fairy.D.   On: 11/16/2022 04:22   CT ANGIO HEAD NECK W WO CM (CODE STROKE)  Result Date: 11/16/2022 CLINICAL DATA:  Stroke suspected EXAM: CT ANGIOGRAPHY HEAD AND  NECK TECHNIQUE: Multidetector CT imaging of the head and neck was performed using the standard protocol during bolus administration of intravenous contrast. Multiplanar CT image  reconstructions and MIPs were obtained to evaluate the vascular anatomy. Carotid stenosis measurements (when applicable) are obtained utilizing NASCET criteria, using the distal internal carotid diameter as the denominator. RADIATION DOSE REDUCTION: This exam was performed according to the departmental dose-optimization program which includes automated exposure control, adjustment of the mA and/or kV according to patient size and/or use of iterative reconstruction technique. CONTRAST:  78m OMNIPAQUE IOHEXOL 350 MG/ML SOLN COMPARISON:  Head CT from earlier today FINDINGS: CTA NECK FINDINGS Aortic arch: Unremarkable where minimally covered Right carotid system: Mild atheromatous plaque and luminal irregularity at the level of the bifurcation/bulb. No stenosis or dissection Left carotid system: Mild atheromatous plaque at the bifurcation and proximal ICA. No ulceration or significant stenosis. Vertebral arteries: The right vertebral artery is occluded with reconstitution in the upper neck. Although the occlusion is age indeterminate, the vessel does not appear collapsed and distally there is a straight interface suggesting recent occlusion. Cannot differentiate a vertebral dissection from atheromatous occlusion on this study. There is likely both retrograde flow and there is a muscular collateral the upper V2 segment. Skeleton: No acute finding Other neck: No acute finding. Upper chest: No acute finding Review of the MIP images confirms the above findings CTA HEAD FINDINGS Anterior circulation: Atheromatous calcification along the carotid siphons. No branch occlusion, beading, or aneurysm. Posterior circulation: Faint flow in the atheromatous right V4 segment which may be retrograde. The left vertebral and basilar arteries are widely patent. No branch occlusion, beading, or aneurysm. Venous sinuses: Unremarkable Anatomic variants: None significant Review of the MIP images confirms the above findings These results were  called by telephone at the time of interpretation on 11/16/2022 at 4:18 am to provider Dr [place]. IMPRESSION: 1. Recent appearing right V1 and V2 occlusion with less intense right vertebral flow at the vertebrobasilar junction. 2. No visible intracranial branch embolism. 3. Overall mild atherosclerosis in the head and neck. Electronically Signed   By: JJorje GuildM.D.   On: 11/16/2022 04:19   CT HEAD CODE STROKE WO CONTRAST  Result Date: 11/16/2022 CLINICAL DATA:  Code stroke.  Right-sided weakness EXAM: CT HEAD WITHOUT CONTRAST TECHNIQUE: Contiguous axial images were obtained from the base of the skull through the vertex without intravenous contrast. RADIATION DOSE REDUCTION: This exam was performed according to the departmental dose-optimization program which includes automated exposure control, adjustment of the mA and/or kV according to patient size and/or use of iterative reconstruction technique. COMPARISON:  None Available. FINDINGS: Brain: There is no mass, hemorrhage or extra-axial collection. The size and configuration of the ventricles and extra-axial CSF spaces are normal. The brain parenchyma is normal, without evidence of acute or chronic infarction. Vascular: No abnormal hyperdensity of the major intracranial arteries or dural venous sinuses. No intracranial atherosclerosis. Skull: The visualized skull base, calvarium and extracranial soft tissues are normal. Sinuses/Orbits: No fluid levels or advanced mucosal thickening of the visualized paranasal sinuses. No mastoid or middle ear effusion. The orbits are normal. ASPECTS ([facility] Early CT Score) - Ganglionic level infarction (caudate, lentiform nuclei, internal capsule, insula, M1-M3 cortex): 7 - Supraganglionic infarction (M4-M6 cortex): 3 Total score (0-10 with 10 being normal): 10 IMPRESSION: 1. No acute intracranial abnormality. 2. ASPECTS is 10. These results were communicated to Dr. SLesleigh Noeat 3:36 am on 11/16/2022 by  text page via the [facility]aging system.  Electronically Signed   By: Ulyses Jarred M.D.   On: 11/16/2022 03:36      HISTORY OF PRESENT ILLNESS Antonio [name] is a 77 y.o. male with a PMHx of hypertension, hyperlipidemia, type 2 diabetes, coronary artery disease s/p CABG, remote, prostate cancer s/p radiation, remote GI/rectal bleed.    N He reports he had been in his usual state of health, went to bed around 7:30 PM, woke up at 1 AM, used the bathroom and ambulated normally, after which he was again in bed watching TV when he had sudden onset chest pain starting at about 2:45 AM for which he activated EMS. Generally weak and noted he was staggering and having trouble walking by EMS.     On arrival to the ED he had sudden onset of right sided weakness that was quite severe but rapidly improving  He was also noted to be initially hypotensive to 70/40 on ED arrival, on multiple checks for at least 5 minutes.  However his blood pressure was normotensive with systolic blood pressures 212-248 within minutes of this.  Due to features of his history concerning for dissection, CTA head and neck in addition to CTA chest abdomen and pelvis were obtained to exclude dissection prior to TNK treatment   HOSPITAL COURSE Right vertebral dissection vs Embolic event s/p TNK   Code Stroke CT head:  No acute abnormality. ASPECTS 10.    CTA head & neck 0335 11/17/22 Recent appearing right V1 and V2 occlusion with less intense right vertebral flow at the vertebrobasilar junction. No visible intracranial branch embolism. Overall mild atherosclerosis in the head and neck. Repeat CTA 0535 11/16/22 Known right proximal vertebral occlusion with worsened flow in the reconstituted distal segments although the distal right V4 segment and right PICA remain enhancing. The left vertebral and basilar are widely patent with no visible downstream emboli. No intracranial hemorrhage.: CTA Chest/Ab/Pelvis: No evidence of aortic  aneurysm or dissection. No acute process in the chest, abdomen, or pelvis. Small hiatal hernia. Colonic diverticulosis. Bladder diverticula. Cardiomegaly with coronary artery calcifications   MRI small right cerebellar CVA. Cerebral Angiogram done 2/5- . Occluded dominant right vertebral artery proximally, with partial reconstitution at the distal mid one third extracranially, with  reocclusion proximal to the right posterior inferior cerebellar artery. Left vertebral artery injection retrogradely opacifies the distal right vertebral basilar junction to the level of the right posterior inferior cerebellar artery.   2D Echo: LVEF 70-75%, Mild LVH, Grade I diastolic dysfunction.  Mildly reduced RVSF, Mildly elevated PASP, Mild MVR   LDL 59 HgbA1c 7.9 VTE prophylaxis - SCDs       Diet    Diet NPO time specified Except for: Sips with Meds        On DAPT Therapy recommendations:  out pt. Disposition:  pending   Hypertension Home meds:  norvasc, TOPROl-XL. Norvasc resumed.  Post TNK for 24  hours < 180/105; as needed labetalol ordered  Long-term BP goal normotensive   Hyperlipidemia Home meds:  crestor '40mg'$ , resumed in hospital LDL 59, goal < 70 Continue statin at discharge   Diabetes type II Uncontrolled Home meds:  metformin, insulin HgbA1c 7.9, goal < 7.0 CBGs Recent Labs (last 2 labs)       Recent Labs    11/18/22 0523 11/18/22 0748 11/18/22 0751  GLUCAP 142* 411* 140*       SSI. Increase levemir to 12units BID Diabetes educator consulted   Other Stroke Risk Factors Advanced Age >/=  32  Former Cigarette smoker ETOH use, alcohol level <10, advised to drink no more than 1-2 drink(s) a day Coronary artery disease   Other Active Problems Hematuria Known history, per patient. Stable. Hgb 15.1    RN Pressure Injury Documentation:     DISCHARGE EXAM Blood pressure 136/69, pulse 65, temperature 98.5 F (36.9 C), temperature source Oral, resp. rate 18,  weight 81.2 kg, SpO2 94 %. Constitutional: Appears well-developed and well-nourished.  Cardiovascular: Normal rate and regular rhythm. Perfusing extremities well Respiratory: Effort normal, non-labored breathing   Neuro: Mental Status: Patient is awake, alert, oriented to person, place, month, year, and situation. Patient is able to give a clear and coherent history. No signs of aphasia or neglect Cranial Nerves: II: Visual Fields are full. Pupils are equal, round, and reactive to light.   III,IV, VI: EOMI without ptosis or diploplia.  V: Facial sensation is mildly reduced on the right side VII: Facial movement is symmetric.  VIII: hearing is intact to voice X: Uvula elevates symmetrically XI: Shoulder shrug is symmetric. XII: tongue is midline  Motor: Tone is normal. Bulk is normal.  5-/5 RUE. 5/5 other extremities. No drift. Sensory: Normal sensation b/l to light touch. Coordination FNF and HKS are symmetric bilaterally  Gait:  Deferred     Discharge Diet       Diet   Diet Carb Modified Fluid consistency: Thin; Room service appropriate? Yes with Assist   liquids  DISCHARGE PLAN Disposition:  Home aspirin 81 mg daily and clopidogrel 75 mg daily for secondary stroke prevention for 3 weeks then Plavix alone. Ongoing stroke risk factor control by Primary Care Physician at time of discharge Follow-up PCP [name], MD in 2 weeks. Follow-up in [facility] in 8 weeks, office to schedule an appointment.   30 minutes were spent preparing discharge.  Patient seen and examined by NP/APP with MD. MD to update note as needed.   Janine Ores, DNP, FNP-BC [facility] Pager: [phone]  ATTENDING ATTESTATION:  NIHSS 0 on exam as above. Doing well. D/c home with outpt f/u with stroke clinic. DAPT x 3 weeks.   Dr. Reeves Forth evaluated pt independently, reviewed imaging, chart, labs. Discussed and formulated plan with the  Resident/APP. Changes were made to the note where appropriate. Please see APP/resident note above for details.      Lota Leamer,MD

## 2022-11-19 NOTE — Progress Notes (Signed)
Physical Therapy Treatment Patient Details Name: Antonio [name] MRN: [id number] DOB: 1946-01-31 Today's Date: 11/19/2022   History of Present Illness Antonio [name] is a 77 y.o. male presenting to hospital with chest pain, right sided weakness. 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 s/p angiogram 2/5. PMHx of hypertension, hyperlipidemia, type 2 diabetes, coronary artery disease s/p CABG, remote, prostate cancer s/p radiation, remote GI/rectal bleed    PT Comments    Patient progressing well towards PT goals. Tolerated higher level balance challenges without any difficulty or LOB. Scored 23/24 on DGI indicating pt is not at increased risk for falls. Reports headache and mild pain in posterior shoulders but otherwise reporting he is back to functional baseline. Able to pick up object off floor without LOB or difficulty and walk backwards/change directions etc. Pt has progressed well enough to not need follow up therapy services. All goals mets. All education completed. Discharge from therapy.    Recommendations for follow up therapy are one component of a multi-disciplinary discharge planning process, led by the attending physician.  Recommendations may be updated based on patient status, additional functional criteria and insurance authorization.  Follow Up Recommendations  No PT follow up     Assistance Recommended at Discharge PRN  Patient can return home with the following     Equipment Recommendations  None recommended by PT    Recommendations for Other Services       Precautions / Restrictions Precautions Precautions: None Restrictions Weight Bearing Restrictions: No     Mobility  Bed Mobility               General bed mobility comments: Sitting EOB upon PT arrival.    Transfers Overall transfer level: Needs assistance Equipment used: None Transfers: Sit to/from Stand Sit to Stand: Modified  independent (Device/Increase time)           General transfer comment: No difficulty, stood from EOB x1.    Ambulation/Gait Ambulation/Gait assistance: Modified independent (Device/Increase time) Gait Distance (Feet): 400 Feet Assistive device: None Gait Pattern/deviations: WFL(Within Functional Limits)   Gait velocity interpretation: 1.31 - 2.62 ft/sec, indicative of limited community ambulator   General Gait Details: Steady gait with no evidence of imbalance, see balance section for higher level balance challenges.   Stairs Stairs: Yes Stairs assistance: Modified independent (Device/Increase time) Stair Management: No rails, Forwards Number of Stairs: 3 General stair comments: No difficulties, no use of rails   Wheelchair Mobility    Modified Rankin (Stroke Patients Only) Modified Rankin (Stroke Patients Only) Pre-Morbid Rankin Score: No symptoms Modified Rankin: No significant disability     Balance Overall balance assessment: Needs assistance Sitting-balance support: Feet supported, No upper extremity supported Sitting balance-Leahy Scale: Normal     Standing balance support: During functional activity Standing balance-Leahy Scale: Good               High level balance activites: Backward walking, Direction changes, Turns, Sudden stops, Head turns High Level Balance Comments: Able to perform above without evidence of imbalance or changes in gait. Able to pick up object from floor without LOB. Standardized Balance Assessment Standardized Balance Assessment : Dynamic Gait Index   Dynamic Gait Index Level Surface: Normal Change in Gait Speed: Normal Gait with Horizontal Head Turns: Normal Gait with Vertical Head Turns: Normal Gait and Pivot Turn: Normal Step Over Obstacle: Mild Impairment Step Around Obstacles: Normal Steps: Normal Total Score: 23      Cognition  Arousal/Alertness: Awake/alert Behavior During Therapy: WFL for tasks  assessed/performed Overall Cognitive Status: Within Functional Limits for tasks assessed                                          Exercises      General Comments General comments (skin integrity, edema, etc.): VSS on RA.      Pertinent Vitals/Pain Pain Assessment Pain Assessment: Faces Faces Pain Scale: Hurts a little bit Pain Location: Bil posterior shoulders, headache Pain Descriptors / Indicators: Sore, Headache Pain Intervention(s): Monitored during session, Repositioned    Home Living                          Prior Function            PT Goals (current goals can now be found in the care plan section) Progress towards PT goals: Progressing toward goals    Frequency    Min 2X/week      PT Plan Current plan remains appropriate;Discharge plan needs to be updated    Co-evaluation              AM-PAC PT "6 Clicks" Mobility   Outcome Measure  Help needed turning from your back to your side while in a flat bed without using bedrails?: None Help needed moving from lying on your back to sitting on the side of a flat bed without using bedrails?: None Help needed moving to and from a bed to a chair (including a wheelchair)?: None Help needed standing up from a chair using your arms (e.g., wheelchair or bedside chair)?: None Help needed to walk in hospital room?: None Help needed climbing 3-5 steps with a railing? : None 6 Click Score: 24    End of Session Equipment Utilized During Treatment: Gait belt Activity Tolerance: Patient tolerated treatment well Patient left: in bed;with call bell/phone within reach (sitting EOB) Nurse Communication: Mobility status PT Visit Diagnosis: Unsteadiness on feet (R26.81);Other symptoms and signs involving the nervous system (F00.712)     Time: [id number] PT Time Calculation (min) (ACUTE ONLY): 15 min  Charges:  $Neuromuscular Re-education: 8-22 mins                     Marisa Severin, PT,  DPT Acute Rehabilitation Services Secure chat preferred Office [place] 11/19/2022, 8:19 AM

## 2022-11-28 ENCOUNTER — Encounter (HOSPITAL_COMMUNITY): Payer: Self-pay

## 2022-11-28 ENCOUNTER — Inpatient Hospital Stay (HOSPITAL_COMMUNITY)
Admission: EM | Admit: 2022-11-28 | Discharge: 2022-11-30 | DRG: 313 | Disposition: A | Discharge status: Home / Self Care | Payer: 59 | Attending: Family Medicine | Admitting: Family Medicine

## 2022-11-28 ENCOUNTER — Other Ambulatory Visit: Payer: Self-pay

## 2022-11-28 ENCOUNTER — Emergency Department (HOSPITAL_COMMUNITY): Payer: 59

## 2022-11-28 ENCOUNTER — Inpatient Hospital Stay (HOSPITAL_COMMUNITY): Payer: 59

## 2022-11-28 DIAGNOSIS — N529 Male erectile dysfunction, unspecified: Secondary | ICD-10-CM | POA: Diagnosis present

## 2022-11-28 DIAGNOSIS — E785 Hyperlipidemia, unspecified: Secondary | ICD-10-CM | POA: Diagnosis present

## 2022-11-28 DIAGNOSIS — I119 Hypertensive heart disease without heart failure: Secondary | ICD-10-CM | POA: Diagnosis present

## 2022-11-28 DIAGNOSIS — G8191 Hemiplegia, unspecified affecting right dominant side: Secondary | ICD-10-CM | POA: Diagnosis present

## 2022-11-28 DIAGNOSIS — Z981 Arthrodesis status: Secondary | ICD-10-CM | POA: Diagnosis not present

## 2022-11-28 DIAGNOSIS — Z7982 Long term (current) use of aspirin: Secondary | ICD-10-CM

## 2022-11-28 DIAGNOSIS — G9520 Unspecified cord compression: Secondary | ICD-10-CM | POA: Diagnosis not present

## 2022-11-28 DIAGNOSIS — E1122 Type 2 diabetes mellitus with diabetic chronic kidney disease: Secondary | ICD-10-CM | POA: Diagnosis present

## 2022-11-28 DIAGNOSIS — I25118 Atherosclerotic heart disease of native coronary artery with other forms of angina pectoris: Secondary | ICD-10-CM | POA: Diagnosis not present

## 2022-11-28 DIAGNOSIS — Z9889 Other specified postprocedural states: Secondary | ICD-10-CM | POA: Diagnosis not present

## 2022-11-28 DIAGNOSIS — M4803 Spinal stenosis, cervicothoracic region: Secondary | ICD-10-CM | POA: Diagnosis present

## 2022-11-28 DIAGNOSIS — I10 Essential (primary) hypertension: Secondary | ICD-10-CM | POA: Diagnosis not present

## 2022-11-28 DIAGNOSIS — Z8249 Family history of ischemic heart disease and other diseases of the circulatory system: Secondary | ICD-10-CM | POA: Diagnosis not present

## 2022-11-28 DIAGNOSIS — Z833 Family history of diabetes mellitus: Secondary | ICD-10-CM

## 2022-11-28 DIAGNOSIS — Z951 Presence of aortocoronary bypass graft: Secondary | ICD-10-CM | POA: Diagnosis not present

## 2022-11-28 DIAGNOSIS — Z7902 Long term (current) use of antithrombotics/antiplatelets: Secondary | ICD-10-CM

## 2022-11-28 DIAGNOSIS — R0789 Other chest pain: Secondary | ICD-10-CM | POA: Diagnosis present

## 2022-11-28 DIAGNOSIS — Z8546 Personal history of malignant neoplasm of prostate: Secondary | ICD-10-CM | POA: Diagnosis not present

## 2022-11-28 DIAGNOSIS — M4802 Spinal stenosis, cervical region: Secondary | ICD-10-CM | POA: Diagnosis present

## 2022-11-28 DIAGNOSIS — Z888 Allergy status to other drugs, medicaments and biological substances status: Secondary | ICD-10-CM | POA: Diagnosis not present

## 2022-11-28 DIAGNOSIS — F419 Anxiety disorder, unspecified: Secondary | ICD-10-CM | POA: Diagnosis present

## 2022-11-28 DIAGNOSIS — E1169 Type 2 diabetes mellitus with other specified complication: Secondary | ICD-10-CM | POA: Diagnosis present

## 2022-11-28 DIAGNOSIS — M542 Cervicalgia: Secondary | ICD-10-CM | POA: Diagnosis not present

## 2022-11-28 DIAGNOSIS — Z923 Personal history of irradiation: Secondary | ICD-10-CM | POA: Diagnosis not present

## 2022-11-28 DIAGNOSIS — Z794 Long term (current) use of insulin: Secondary | ICD-10-CM

## 2022-11-28 DIAGNOSIS — Z801 Family history of malignant neoplasm of trachea, bronchus and lung: Secondary | ICD-10-CM | POA: Diagnosis not present

## 2022-11-28 DIAGNOSIS — M79602 Pain in left arm: Secondary | ICD-10-CM | POA: Diagnosis present

## 2022-11-28 DIAGNOSIS — K219 Gastro-esophageal reflux disease without esophagitis: Secondary | ICD-10-CM | POA: Diagnosis present

## 2022-11-28 DIAGNOSIS — M25512 Pain in left shoulder: Secondary | ICD-10-CM | POA: Diagnosis not present

## 2022-11-28 DIAGNOSIS — I2510 Atherosclerotic heart disease of native coronary artery without angina pectoris: Secondary | ICD-10-CM | POA: Diagnosis present

## 2022-11-28 DIAGNOSIS — R079 Chest pain, unspecified: Principal | ICD-10-CM

## 2022-11-28 DIAGNOSIS — N1831 Chronic kidney disease, stage 3a: Secondary | ICD-10-CM | POA: Diagnosis present

## 2022-11-28 DIAGNOSIS — Z8042 Family history of malignant neoplasm of prostate: Secondary | ICD-10-CM | POA: Diagnosis not present

## 2022-11-28 DIAGNOSIS — I1310 Hypertensive heart and chronic kidney disease without heart failure, with stage 1 through stage 4 chronic kidney disease, or unspecified chronic kidney disease: Secondary | ICD-10-CM | POA: Diagnosis present

## 2022-11-28 DIAGNOSIS — R072 Precordial pain: Secondary | ICD-10-CM | POA: Diagnosis not present

## 2022-11-28 DIAGNOSIS — E1165 Type 2 diabetes mellitus with hyperglycemia: Secondary | ICD-10-CM | POA: Diagnosis present

## 2022-11-28 DIAGNOSIS — Z7984 Long term (current) use of oral hypoglycemic drugs: Secondary | ICD-10-CM

## 2022-11-28 DIAGNOSIS — Z79899 Other long term (current) drug therapy: Secondary | ICD-10-CM

## 2022-11-28 DIAGNOSIS — F1011 Alcohol abuse, in remission: Secondary | ICD-10-CM | POA: Diagnosis present

## 2022-11-28 DIAGNOSIS — N400 Enlarged prostate without lower urinary tract symptoms: Secondary | ICD-10-CM | POA: Diagnosis present

## 2022-11-28 DIAGNOSIS — E119 Type 2 diabetes mellitus without complications: Secondary | ICD-10-CM

## 2022-11-28 HISTORY — DX: Cerebral infarction, unspecified: I63.9

## 2022-11-28 HISTORY — DX: Chronic kidney disease, stage 3a: N18.31

## 2022-11-28 LAB — BASIC METABOLIC PANEL
Anion gap: 12 (ref 5–15)
BUN: 15 mg/dL (ref 8–23)
CO2: 21 mmol/L — ABNORMAL LOW (ref 22–32)
Calcium: 9.1 mg/dL (ref 8.9–10.3)
Chloride: 107 mmol/L (ref 98–111)
Creatinine, Ser: 1.36 mg/dL — ABNORMAL HIGH (ref 0.61–1.24)
GFR, Estimated: 55 mL/min — ABNORMAL LOW (ref >60)
Glucose, Bld: 230 mg/dL — ABNORMAL HIGH (ref 70–99)
Potassium: 3.5 mmol/L (ref 3.5–5.1)
Sodium: 140 mmol/L (ref 135–145)

## 2022-11-28 LAB — CBC WITH DIFFERENTIAL/PLATELET
Abs Immature Granulocytes: 0.03 10*3/uL (ref 0.00–0.07)
Basophils Absolute: 0 10*3/uL (ref 0.0–0.1)
Basophils Relative: 0 %
Eosinophils Absolute: 0.2 10*3/uL (ref 0.0–0.5)
Eosinophils Relative: 2 %
HCT: 42.3 % (ref 39.0–52.0)
Hemoglobin: 14.1 g/dL (ref 13.0–17.0)
Immature Granulocytes: 1 %
Lymphocytes Relative: 22 %
Lymphs Abs: 1.5 10*3/uL (ref 0.7–4.0)
MCH: 29.1 pg (ref 26.0–34.0)
MCHC: 33.3 g/dL (ref 30.0–36.0)
MCV: 87.2 fL (ref 80.0–100.0)
Monocytes Absolute: 0.4 10*3/uL (ref 0.1–1.0)
Monocytes Relative: 6 %
Neutro Abs: 4.6 10*3/uL (ref 1.7–7.7)
Neutrophils Relative %: 69 %
Platelets: 153 10*3/uL (ref 150–400)
RBC: 4.85 MIL/uL (ref 4.22–5.81)
RDW: 15.5 % (ref 11.5–15.5)
WBC: 6.6 10*3/uL (ref 4.0–10.5)
nRBC: 0 % (ref 0.0–0.2)

## 2022-11-28 LAB — CBG MONITORING, ED: Glucose-Capillary: 60 mg/dL — ABNORMAL LOW (ref 70–99)

## 2022-11-28 LAB — TROPONIN I (HIGH SENSITIVITY)
Troponin I (High Sensitivity): 9 ng/L (ref <18)
Troponin I (High Sensitivity): 10 ng/L (ref <18)

## 2022-11-28 MED ORDER — INSULIN DETEMIR 100 UNIT/ML ~~LOC~~ SOLN
20.0000 [IU] | Freq: Every day | SUBCUTANEOUS | Status: DC
  Administered 2022-11-29 – 2022-11-30 (×2): 20 [IU] via SUBCUTANEOUS
  Filled 2022-11-28 (×3): qty 0.2

## 2022-11-28 MED ORDER — FERROUS SULFATE 325 (65 FE) MG PO TABS
325.0000 mg | ORAL_TABLET | Freq: Every day | ORAL | Status: DC
  Administered 2022-11-29 – 2022-11-30 (×2): 325 mg via ORAL
  Filled 2022-11-28 (×2): qty 1

## 2022-11-28 MED ORDER — CLOPIDOGREL BISULFATE 75 MG PO TABS
75.0000 mg | ORAL_TABLET | Freq: Every day | ORAL | Status: DC
  Administered 2022-11-29 – 2022-11-30 (×2): 75 mg via ORAL
  Filled 2022-11-28 (×2): qty 1

## 2022-11-28 MED ORDER — HEPARIN SODIUM (PORCINE) 5000 UNIT/ML IJ SOLN
5000.0000 [IU] | Freq: Three times a day (TID) | INTRAMUSCULAR | Status: DC
  Administered 2022-11-28 – 2022-11-30 (×5): 5000 [IU] via SUBCUTANEOUS
  Filled 2022-11-28 (×5): qty 1

## 2022-11-28 MED ORDER — LINAGLIPTIN 5 MG PO TABS
5.0000 mg | ORAL_TABLET | Freq: Every day | ORAL | Status: DC
  Administered 2022-11-29 – 2022-11-30 (×2): 5 mg via ORAL
  Filled 2022-11-28 (×2): qty 1

## 2022-11-28 MED ORDER — TRAMADOL HCL 50 MG PO TABS
50.0000 mg | ORAL_TABLET | Freq: Two times a day (BID) | ORAL | Status: DC | PRN
  Administered 2022-11-28 – 2022-11-30 (×4): 50 mg via ORAL
  Filled 2022-11-28 (×4): qty 1

## 2022-11-28 MED ORDER — AMLODIPINE BESYLATE 10 MG PO TABS
10.0000 mg | ORAL_TABLET | Freq: Every morning | ORAL | Status: DC
  Administered 2022-11-29 – 2022-11-30 (×2): 10 mg via ORAL
  Filled 2022-11-28: qty 1
  Filled 2022-11-28 (×2): qty 2

## 2022-11-28 MED ORDER — ACETAMINOPHEN 325 MG PO TABS: 650.0000 mg | ORAL_TABLET | ORAL | Status: DC | PRN

## 2022-11-28 MED ORDER — ACETAMINOPHEN 325 MG PO TABS
325.0000 mg | ORAL_TABLET | Freq: Once | ORAL | Status: AC
  Administered 2022-11-28: 325 mg via ORAL
  Filled 2022-11-28: qty 1

## 2022-11-28 MED ORDER — NITROGLYCERIN 0.4 MG SL SUBL: 0.4000 mg | SUBLINGUAL_TABLET | SUBLINGUAL | Status: DC | PRN

## 2022-11-28 MED ORDER — ASPIRIN 81 MG PO CHEW
324.0000 mg | CHEWABLE_TABLET | Freq: Once | ORAL | Status: AC
  Administered 2022-11-28: 324 mg via ORAL
  Filled 2022-11-28: qty 4

## 2022-11-28 MED ORDER — ISOSORBIDE MONONITRATE ER 60 MG PO TB24
120.0000 mg | ORAL_TABLET | Freq: Every day | ORAL | Status: DC
  Administered 2022-11-29 – 2022-11-30 (×2): 120 mg via ORAL
  Filled 2022-11-28: qty 2
  Filled 2022-11-28: qty 4

## 2022-11-28 MED ORDER — ACETAMINOPHEN 500 MG PO TABS: 1000.0000 mg | ORAL_TABLET | Freq: Three times a day (TID) | ORAL | Status: DC | PRN

## 2022-11-28 MED ORDER — ASPIRIN 81 MG PO TBEC
81.0000 mg | DELAYED_RELEASE_TABLET | Freq: Every day | ORAL | Status: DC
  Administered 2022-11-29 – 2022-11-30 (×2): 81 mg via ORAL
  Filled 2022-11-28 (×2): qty 1

## 2022-11-28 MED ORDER — ACETAMINOPHEN 325 MG PO TABS
650.0000 mg | ORAL_TABLET | Freq: Once | ORAL | Status: AC
  Administered 2022-11-28: 650 mg via ORAL
  Filled 2022-11-28: qty 2

## 2022-11-28 MED ORDER — ONDANSETRON HCL 4 MG/2ML IJ SOLN: 4.0000 mg | Freq: Four times a day (QID) | INTRAMUSCULAR | Status: DC | PRN

## 2022-11-28 MED ORDER — ROSUVASTATIN CALCIUM 20 MG PO TABS
40.0000 mg | ORAL_TABLET | Freq: Every day | ORAL | Status: DC
  Administered 2022-11-29 – 2022-11-30 (×2): 40 mg via ORAL
  Filled 2022-11-28 (×2): qty 2

## 2022-11-28 MED ORDER — CARVEDILOL 12.5 MG PO TABS
12.5000 mg | ORAL_TABLET | Freq: Two times a day (BID) | ORAL | Status: DC
  Administered 2022-11-28 – 2022-11-30 (×4): 12.5 mg via ORAL
  Filled 2022-11-28 (×4): qty 1
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
## 2022-11-28 NOTE — ED Triage Notes (Signed)
Pt came in via POV for CP that started a couple of hrs ago. Lt side of chest, radiates from Lt side of neck down to his side. Does have Cardiac Hx of open heart surgery in 1996. Rates pian 8/10, took 1 Nitro at home, said it help some with his pain, A/Ox4.

## 2022-11-28 NOTE — ED Provider Triage Note (Signed)
Emergency Medicine Provider Triage Evaluation Note  Antonio [name] , a 77 y.o. male  was evaluated in triage.  Pt complains of chest pain.  States symptoms began a few hours ago.  He describes having left-sided chest tightness that radiates to his left neck and arm.  He is also having mild nausea.  Has cardiac history.  He also is feeling short of breath and having palpitations.  Took a nitro this morning with no improvement in symptoms.  Was here just last week for CVA.  Review of Systems  Positive: See above Negative:   Physical Exam  BP (!) 168/80 (BP Location: Right Arm)   Pulse 81   Temp 98.7 F (37.1 C)   Resp 16   Ht 5' 7.5" (1.715 m)   Wt 81.2 kg   SpO2 99%   BMI 27.62 kg/m  Gen:   Awake, no distress   Resp:  Mild tachypnea MSK:   Moves extremities without difficulty  Other:  S1/S2 without murmur  Medical Decision Making  Medically screening exam initiated at 1:51 PM.  Appropriate orders placed.  Antonio [name] was informed that the remainder of the evaluation will be completed by another provider, this initial triage assessment does not replace that evaluation, and the importance of remaining in the ED until their evaluation is complete.     Mickie Hillier, PA-C 11/28/22 1354
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
## 2022-11-28 NOTE — ED Provider Notes (Signed)
[place] [facility] Provider Note   CSN: [id number] Arrival date & time: 11/28/22  1328     History  Chief Complaint  Patient presents with   Chest Pain    Antonio [name] is a 77 y.o. male.  Patient here with left-sided chest pain he states has been ongoing for few weeks may be exertional.  History of CAD history of CVA.  Just admitted for stroke workup recently.  Has been having left-sided chest discomfort during this time.  He has noticed some chest pain with exertion during that time.  Took nitroglycerin that may be helped earlier today.  He is having some pain on the left arm.  But also still having some headaches at times.  Denies any new weakness or numbness or speech changes or stroke symptoms.  Denies any shortness of breath cough or sputum production.  The history is provided by the patient.       Home Medications Prior to Admission medications   Medication Sig Start Date End Date Taking? Authorizing Provider  acetaminophen (TYLENOL) 650 MG CR tablet Take 650-1,300 mg by mouth every 8 (eight) hours as needed for pain.    [provider]  ALPRAZolam Duanne Moron) 0.25 MG tablet Take 0.25 mg by mouth 2 (two) times daily as needed for anxiety. 03/12/22   [provider]  amLODipine (NORVASC) 10 MG tablet Take 1 tablet (10 mg total) by mouth every morning. 04/17/22   Deberah Pelton, NP  aspirin EC 81 MG tablet Take 1 tablet (81 mg total) by mouth daily. Swallow whole. 11/20/22   Janine Ores, NP  clopidogrel (PLAVIX) 75 MG tablet Take 1 tablet (75 mg total) by mouth daily. 11/20/22   Janine Ores, NP  Continuous Blood Gluc Receiver (FREESTYLE LIBRE 2 READER) DEVI See admin instructions. 05/07/22   [provider]  Continuous Blood Gluc Sensor (FREESTYLE LIBRE 2 SENSOR) MISC See admin instructions. 06/03/22   [provider]  ferrous sulfate 325 (65 FE) MG tablet Take 1 tablet (325 mg total) by mouth daily. 07/22/21  11/17/23  Elodia Florence., MD  insulin detemir (LEVEMIR FLEXTOUCH) 100 UNIT/ML FlexPen Inject 20 Units into the skin daily as needed (CBG >130).    [provider]  Insulin Pen Needle (BD PEN NEEDLE NANO U/F) 32G X 4 MM MISC 1 pen by Does not apply route daily. 11/11/13   Kyra Leyland, PA-C  isosorbide mononitrate (IMDUR) 120 MG 24 hr tablet TAKE 1 TABLET (120 MG TOTAL) BY MOUTH DAILY. 10/25/22   Martinique, Peter M, MD  metFORMIN (GLUCOPHAGE) 1000 MG tablet Take 1,000 mg by mouth daily. 02/09/21   [provider]  metoprolol succinate (TOPROL-XL) 50 MG 24 hr tablet Take 1 tablet (50 mg total) by mouth daily. 04/17/22   Deberah Pelton, NP  Multiple Vitamin (MULTIVITAMIN WITH MINERALS) TABS Take 1 tablet by mouth every morning.    [provider]  nitroGLYCERIN (NITROSTAT) 0.4 MG SL tablet PLACE 1 TABLET UNDER THE TONGUE EVERY 5 MINUTES FOR 3 DOSES AS NEEDED FOR CHEST PAIN Patient taking differently: Place 0.4 mg under the tongue every 5 (five) minutes as needed for chest pain. 10/25/22   Martinique, Peter M, MD  rosuvastatin (CRESTOR) 40 MG tablet TAKE 1 TABLET (40 MG TOTAL) BY MOUTH DAILY. 08/02/21   Martinique, Peter M, MD  sitaGLIPtin (JANUVIA) 100 MG tablet Take 100 mg by mouth every morning.    [provider]  traMADol (  ULTRAM) 50 MG tablet Take 50 mg by mouth 2 (two) times daily as needed for pain. 02/21/22   [provider]      Allergies    Demerol    Review of Systems   Review of Systems  Physical Exam Updated Vital Signs BP (!) 153/90   Pulse 79   Temp 98.7 F (37.1 C)   Resp 19   Ht 5' 7.5" (1.715 m)   Wt 81.2 kg   SpO2 99%   BMI 27.62 kg/m  Physical Exam Vitals and nursing note reviewed.  Constitutional:      General: He is not in acute distress.    Appearance: He is well-developed. He is not ill-appearing.  HENT:     Head: Normocephalic and atraumatic.  Eyes:     Extraocular Movements: Extraocular movements intact.      Conjunctiva/sclera: Conjunctivae normal.     Pupils: Pupils are equal, round, and reactive to light.  Cardiovascular:     Rate and Rhythm: Normal rate and regular rhythm.     Pulses:          Radial pulses are 2+ on the right side and 2+ on the left side.     Heart sounds: Normal heart sounds. No murmur heard. Pulmonary:     Effort: Pulmonary effort is normal. No respiratory distress.     Breath sounds: Normal breath sounds.  Abdominal:     Palpations: Abdomen is soft.     Tenderness: There is no abdominal tenderness.  Musculoskeletal:        General: No swelling.     Cervical back: Normal range of motion and neck supple.     Right lower leg: No edema.     Left lower leg: No edema.  Skin:    General: Skin is warm and dry.     Capillary Refill: Capillary refill takes less than 2 seconds.  Neurological:     General: No focal deficit present.     Mental Status: He is alert.     Comments: Appears to have grossly normal strength throughout, normal sensation, no drift, normal finger-nose-finger, normal speech, no visual field issues  Psychiatric:        Mood and Affect: Mood normal.     ED Results / Procedures / Treatments   Labs (all labs ordered are listed, but only abnormal results are displayed) Labs Reviewed  BASIC METABOLIC PANEL - Abnormal; Notable for the following components:      Result Value   CO2 21 (*)    Glucose, Bld 230 (*)    Creatinine, Ser 1.36 (*)    GFR, Estimated 55 (*)    All other components within normal limits  CBG MONITORING, ED - Abnormal; Notable for the following components:   Glucose-Capillary 60 (*)    All other components within normal limits  CBC WITH DIFFERENTIAL/PLATELET  TROPONIN I (HIGH SENSITIVITY)  TROPONIN I (HIGH SENSITIVITY)    EKG  Normal sinus rhythm,  Radiology CT HEAD WO CONTRAST (5MM)  Result Date: 11/28/2022 CLINICAL DATA:  Headache, increasing frequency or severity EXAM: CT HEAD WITHOUT CONTRAST TECHNIQUE: Contiguous  axial images were obtained from the base of the skull through the vertex without intravenous contrast. RADIATION DOSE REDUCTION: This exam was performed according to the departmental dose-optimization program which includes automated exposure control, adjustment of the mA and/or kV according to patient size and/or use of iterative reconstruction technique. COMPARISON:  11/16/2022 FINDINGS: Brain: There is periventricular white matter decreased attenuation  consistent with small vessel ischemic changes. Gray-white differentiation is preserved. No acute intracranial hemorrhage, mass effect or shift. No hydrocephalus. Vascular: No hyperdense vessel or unexpected calcification. Skull: Normal. Negative for fracture or focal lesion. Sinuses/Orbits: No acute finding. IMPRESSION: Periventricular white matter changes consistent with chronic small vessel ischemia. No acute intracranial process identified. Electronically Signed   By: Sammie Bench M.D.   On: 11/28/2022 16:10   DG Chest 2 View  Result Date: 11/28/2022 CLINICAL DATA:  Chest pain. EXAM: CHEST - 2 VIEW COMPARISON:  June 26, 2022. FINDINGS: The heart size and mediastinal contours are within normal limits. Status post coronary bypass graft. Minimal bibasilar subsegmental atelectasis is noted. The visualized skeletal structures are unremarkable. IMPRESSION: Minimal bibasilar subsegmental atelectasis. Electronically Signed   By: Marijo Conception M.D.   On: 11/28/2022 14:34    Procedures Procedures    Medications Ordered in ED Medications  carvedilol (COREG) tablet 12.5 mg (has no administration in time range)  aspirin chewable tablet 324 mg (324 mg Oral Given 11/28/22 1358)  acetaminophen (TYLENOL) tablet 650 mg (650 mg Oral Given 11/28/22 1647)    ED Course/ Medical Decision Making/ A&P                             Medical Decision Making Amount and/or Complexity of Data Reviewed Radiology: ordered.  Risk OTC drugs. Decision regarding  hospitalization.   Antonio [name] is here with chest pain, history of CVA, CAD.  Describes exertional chest pain over the last few weeks.  Had some last week when he was in the hospital for stroke workup.  But has continued since he is left.  EKG shows T wave inversions throughout but appear to be seen on prior EKGs.  Has had a heart cath couple years ago with multivessel disease.  He is not having any stroke symptoms.  Still has mild headaches at times.  He took nitroglycerin which might help this morning.  Morbidities also include hypertension, high cholesterol, prior alcohol use.  Will get CBC, BMP, troponin, chest x-ray.  Will consult cardiology.  Patient with troponin within normal limits per my review and interpretation.  Lab work otherwise unremarkable.  Chest x-ray without any evidence of pneumonia or pneumothorax.  Will contact cardiology to discuss need for further cardiac workup possibly.  Dr. Loletha Grayer w/ cardiology team recommends medicine admission for further ACS rule out.  Will admit to medicine for further care.  This chart was dictated using voice recognition software.  Despite best efforts to proofread,  errors can occur which can change the documentation meaning.         Final Clinical Impression(s) / ED Diagnoses Final diagnoses:  Chest pain, unspecified type    Rx / DC Orders ED Discharge Orders     None         Lennice Sites, DO 11/28/22 1845

## 2022-11-29 ENCOUNTER — Inpatient Hospital Stay (HOSPITAL_COMMUNITY): Payer: 59

## 2022-11-29 DIAGNOSIS — Z951 Presence of aortocoronary bypass graft: Secondary | ICD-10-CM | POA: Diagnosis not present

## 2022-11-29 DIAGNOSIS — E1169 Type 2 diabetes mellitus with other specified complication: Secondary | ICD-10-CM

## 2022-11-29 DIAGNOSIS — I119 Hypertensive heart disease without heart failure: Secondary | ICD-10-CM

## 2022-11-29 DIAGNOSIS — Z9889 Other specified postprocedural states: Secondary | ICD-10-CM

## 2022-11-29 DIAGNOSIS — R0789 Other chest pain: Secondary | ICD-10-CM | POA: Diagnosis not present

## 2022-11-29 DIAGNOSIS — Z794 Long term (current) use of insulin: Secondary | ICD-10-CM

## 2022-11-29 DIAGNOSIS — G9520 Unspecified cord compression: Secondary | ICD-10-CM

## 2022-11-29 DIAGNOSIS — M542 Cervicalgia: Secondary | ICD-10-CM | POA: Diagnosis not present

## 2022-11-29 LAB — LIPID PANEL
Cholesterol: 117 mg/dL (ref 0–200)
HDL: 37 mg/dL — ABNORMAL LOW (ref >40)
LDL Cholesterol: 66 mg/dL (ref 0–99)
Total CHOL/HDL Ratio: 3.2 RATIO
Triglycerides: 70 mg/dL (ref <150)
VLDL: 14 mg/dL (ref 0–40)

## 2022-11-29 LAB — CBC
HCT: 41.3 % (ref 39.0–52.0)
Hemoglobin: 14.2 g/dL (ref 13.0–17.0)
MCH: 29.6 pg (ref 26.0–34.0)
MCHC: 34.4 g/dL (ref 30.0–36.0)
MCV: 86 fL (ref 80.0–100.0)
Platelets: 149 10*3/uL — ABNORMAL LOW (ref 150–400)
RBC: 4.8 MIL/uL (ref 4.22–5.81)
RDW: 15.5 % (ref 11.5–15.5)
WBC: 5.6 10*3/uL (ref 4.0–10.5)
nRBC: 0 % (ref 0.0–0.2)

## 2022-11-29 LAB — TROPONIN I (HIGH SENSITIVITY)
Troponin I (High Sensitivity): 12 ng/L (ref <18)
Troponin I (High Sensitivity): 13 ng/L (ref <18)

## 2022-11-29 LAB — GLUCOSE, CAPILLARY
Glucose-Capillary: 189 mg/dL — ABNORMAL HIGH (ref 70–99)
Glucose-Capillary: 151 mg/dL — ABNORMAL HIGH (ref 70–99)

## 2022-11-29 LAB — CREATININE, SERUM
Creatinine, Ser: 1.3 mg/dL — ABNORMAL HIGH (ref 0.61–1.24)
GFR, Estimated: 58 mL/min — ABNORMAL LOW (ref >60)

## 2022-11-29 LAB — CBG MONITORING, ED: Glucose-Capillary: 110 mg/dL — ABNORMAL HIGH (ref 70–99)

## 2022-11-29 MED ORDER — OXYCODONE HCL 5 MG PO TABS
5.0000 mg | ORAL_TABLET | Freq: Four times a day (QID) | ORAL | Status: DC | PRN
  Administered 2022-11-29 – 2022-11-30 (×3): 5 mg via ORAL
  Filled 2022-11-29 (×3): qty 1

## 2022-11-29 MED ORDER — INSULIN ASPART 100 UNIT/ML IJ SOLN
0.0000 [IU] | Freq: Three times a day (TID) | INTRAMUSCULAR | Status: DC
  Administered 2022-11-29: 2 [IU] via SUBCUTANEOUS
  Administered 2022-11-30: 1 [IU] via SUBCUTANEOUS
  Administered 2022-11-30: 2 [IU] via SUBCUTANEOUS

## 2022-11-29 MED ORDER — MELATONIN 3 MG PO TABS
3.0000 mg | ORAL_TABLET | Freq: Every day | ORAL | Status: DC
  Administered 2022-11-29 (×2): 3 mg via ORAL
  Filled 2022-11-29 (×2): qty 1

## 2022-11-29 MED ORDER — ORAL CARE MOUTH RINSE: 15.0000 mL | OROMUCOSAL | Status: DC | PRN

## 2022-11-29 NOTE — Hospital Course (Signed)
Antonio [name] is a 77 y.o. male with medical history significant of  hypertension, hyperlipidemia, type 2 diabetes, coronary artery disease s/p CABG, remote, prostate cancer s/p radiation, remote GI/rectal bleed , GERD ,hematuria s/p TNK on most recent admission, CKDIIIa. 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 Embolism at which time he was treated with TNK and his Effient was discontinued  and he was discharged on asa 81 and plavix 75 mg daily for 3 weeks then Plavix alone . Patient now presents to ED with left sided chest pain described as pressure like sensation that initially stared while he was walking but notably  relieved with rest. He states due to recurrent pain he presented to ED. He notes associated symptoms of left arm pain. Currently he states no chest pain but still has complaint of shoulder pain and upper arm pain ,worse with movement of the arm.  He notes no trauma. He denies any associated weakness.  On further ros no sob/n/v/d/dysuria.    Cardiology has been consulted. Dr. Chauncey Cruel feels that the patient's chest pain is atypical. He recommends that the patient be continued on DAPT. The patient has had multiple investigations for ischemia in the last 2 years without significant findings.    The left shoulder and neck pain is very much like the pain the patient had some years ago that required surgical intervention by neurosurgery. Will check CT of the patient's neck to investigate. He may require MRI and neurosurgical consult to fully evaluate.

## 2022-11-29 NOTE — Progress Notes (Signed)
PROGRESS NOTE  Antonio [name] [id number] DOB: [birth year] DOA: 11/28/2022 PCP: [name], MD  Brief History    Antonio [name] is a 77 y.o. male with medical history significant of  hypertension, hyperlipidemia, type 2 diabetes, coronary artery disease s/p CABG, remote, prostate cancer s/p radiation, remote GI/rectal bleed , GERD ,hematuria s/p TNK on most recent admission, CKDIIIa. 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 Embolism at which time he was treated with TNK and his Effient was discontinued  and he was discharged on asa 81 and plavix 75 mg daily for 3 weeks then Plavix alone . Patient now presents to ED with left sided chest pain described as pressure like sensation that initially stared while he was walking but notably  relieved with rest. He states due to recurrent pain he presented to ED. He notes associated symptoms of left arm pain. Currently he states no chest pain but still has complaint of shoulder pain and upper arm pain ,worse with movement of the arm.  He notes no trauma. He denies any associated weakness.  On further ros no sob/n/v/d/dysuria.   Cardiology has been consulted. Dr. Chauncey Cruel feels that the patient's chest pain is atypical. He recommends that the patient be continued on DAPT. The patient has had multiple investigations for ischemia in the last 2 years without significant findings.   The left shoulder and neck pain is very much like the pain the patient had some years ago that required surgical intervention by neurosurgery. Will check CT of the patient's neck to investigate. He may require MRI and neurosurgical consult to fully evaluate.  Consultants  Cardiology  Procedures  None  Antibiotics   Anti-infectives (From admission, onward)    None         Subjective  The patient is resting comfortably. No new complaints.   Objective   Vitals:  Vitals:    11/29/22 1130 11/29/22 1305  BP:  96/65  Pulse:    Resp:  15  Temp: 98.1 F (36.7 C)   SpO2:      Exam:  Constitutional:  Appears calm and comfortable Neck:  neck appears normal, no masses, normal ROM, supple no thyromegaly Patient has tenderness to palpation of his neck on the left side and the posterior scalene extending to the shoulder. Respiratory:  CTA bilaterally, no w/r/r.  Respiratory effort normal. No retractions or accessory muscle use Cardiovascular:  RRR, no m/r/g No LE extremity edema   Normal pedal pulses Abdomen:  Abdomen appears normal; no tenderness or masses No hernias No HSM Musculoskeletal:  Digits/nails BUE: no clubbing, cyanosis, petechiae, infection exam of joints, bones, muscles of at least one of following: head/neck, RUE, LUE, RLE, LLE   strength and tone normal, no atrophy, no abnormal movements No tenderness, masses Normal ROM, no contractures  gait and station Patient has tenderness to palpation of his neck on the left side and the posterior scalene extending to the shoulder. Skin:  No rashes, lesions, ulcers palpation of skin: no induration or nodules Neurologic:  CN 2-12 intact Sensation all 4 extremities intact Psychiatric:  Mental status Mood, affect appropriate Orientation to person, place, time  judgment and insight appear intact     I have personally reviewed the following:   Today's Data   Vitals:   11/29/22 1130 11/29/22 1305  BP:  96/65  Pulse:    Resp:  15  Temp: 98.1 F (36.7 C)  SpO2:       Lab Data  CBC    Component Value Date/Time   WBC 5.6 11/28/2022 2357   RBC 4.80 11/28/2022 2357   HGB 14.2 11/28/2022 2357   HGB 9.8 (L) 09/10/2021 1631   HCT 41.3 11/28/2022 2357   HCT 32.1 (L) 09/10/2021 1631   PLT 149 (L) 11/28/2022 2357   PLT 170 09/10/2021 1631   MCV 86.0 11/28/2022 2357   MCV 84 09/10/2021 1631   MCH 29.6 11/28/2022 2357   MCHC 34.4 11/28/2022 2357   RDW 15.5 11/28/2022 2357   RDW 17.0  (H) 09/10/2021 1631   LYMPHSABS 1.5 11/28/2022 1402   LYMPHSABS 1.7 08/04/2020 1642   MONOABS 0.4 11/28/2022 1402   EOSABS 0.2 11/28/2022 1402   EOSABS 0.1 08/04/2020 1642   BASOSABS 0.0 11/28/2022 1402   BASOSABS 0.0 08/04/2020 1642      Latest Ref Rng & Units 11/28/2022   11:57 PM 11/28/2022    2:02 PM 11/19/2022    4:35 AM  BMP  Glucose 70 - 99 mg/dL  230  156   BUN 8 - 23 mg/dL  15  15   Creatinine 0.61 - 1.24 mg/dL 1.30  1.36  1.35   Sodium 135 - 145 mmol/L  140  137   Potassium 3.5 - 5.1 mmol/L  3.5  3.5   Chloride 98 - 111 mmol/L  107  106   CO2 22 - 32 mmol/L  21  23   Calcium 8.9 - 10.3 mg/dL  9.1  8.7      Micro Data   Results for orders placed or performed during the hospital encounter of 11/16/22  MRSA Next Gen by PCR, Nasal     Status: None   Collection Time: 11/16/22  6:43 AM   Specimen: Nasal Mucosa; Nasal Swab  Result Value Ref Range Status   MRSA by PCR Next Gen NOT DETECTED NOT DETECTED Final    Comment: (NOTE) The GeneXpert MRSA Assay (FDA approved for NASAL specimens only), is one component of a comprehensive MRSA colonization surveillance program. It is not intended to diagnose MRSA infection nor to guide or monitor treatment for MRSA infections. Test performance is not FDA approved in patients less than 66 years old. Performed at [facility], [place] [address], [place], [place] [address]     Scheduled Meds:  amLODipine  10 mg Oral q morning   aspirin EC  81 mg Oral Daily   carvedilol  12.5 mg Oral BID WC   clopidogrel  75 mg Oral Daily   ferrous sulfate  325 mg Oral Daily   heparin  5,000 Units Subcutaneous Q8H   insulin aspart  0-9 Units Subcutaneous TID WC   insulin detemir  20 Units Subcutaneous Daily   isosorbide mononitrate  120 mg Oral Daily   linagliptin  5 mg Oral Daily   melatonin  3 mg Oral QHS   rosuvastatin  40 mg Oral Daily   sodium chloride flush  3 mL Intravenous Q12H    Principal Problem:   Chest pain Active  Problems:   Type II diabetes mellitus (HCC)   Hyperlipidemia associated with type 2 diabetes mellitus (HCC)   S/P CABG (coronary artery bypass graft)   Hypertensive heart disease without heart failure   Neck pain with history of cervical spinal surgery   LOS: 1 day   A & P  Neck pain with history of cervical spinal surgery The patient states that about 10 years  ago he had similar symptoms of pain in his neck with radiation to his shoulder. He states that he saw a neurosurgeon who provided a procedure on his neck that helped. He states that the pain then was very similar to his pain now. Will check a CT of the patient's neck and shoulder. May also need an MRI. Consider consult to neurosurgery pending the results of imagery.  S/P CABG (coronary artery bypass graft) The patient is receiving beta blocker. Continue DAPT as per cardiology. I apperciate cardiology's assistance.  Chest pain Chest pain is atypical. Troponins have been negative. Left shoulder pain is likely musculoskeletal vs radicular in nature and not related to CAD. The patient has had multiple investigations for ischemia in the last 2-3 years. Cardiology feels that there is no acute need for further investigation.  Hyperlipidemia associated with type 2 diabetes mellitus ([place]) Blood pressures somewhat elevated on coreg and amlodipine. Glucoses are controlled with Levemir and SSI.   I have seen and examined this patient myself. I have spent 32 minutes in his evaluation and care.  DVT prophylaxis: Heparin Code Status: Full Code Family Communication: None available Disposition Plan: home    Mahkai Fangman, DO [facility] Direct contact: see [email or web address]  7PM-7AM contact night coverage as above 11/29/2022, 1:57 PM  LOS: 1 day

## 2022-11-29 NOTE — Assessment & Plan Note (Signed)
Blood pressures somewhat elevated on coreg and amlodipine. Glucoses are controlled with Levemir and SSI.
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
## 2022-11-29 NOTE — ED Notes (Signed)
Pt resting, rise/fall of chest noted. Vitals stable, no acute distress noted. Call bell in reach.

## 2022-11-29 NOTE — Assessment & Plan Note (Addendum)
The patient is receiving beta blocker. Continue DAPT as per cardiology. I apperciate cardiology's assistance.

## 2022-11-29 NOTE — ED Notes (Signed)
Pt sitting upright in bed with eyes open, eating lunch. Pt states to feeling much better. Pt A&Ox4 at this time.

## 2022-11-29 NOTE — Progress Notes (Addendum)
Progress Note  Patient Name: Antonio [name] Date of Encounter: 11/29/2022  Primary Cardiologist: Peter Martinique, MD  Subjective   Still has fairly pervasive left arm pain worse with position changes. Associated with this is some tightening in his left upper chest but no recurrent overt chest pain/pressure. Breathing fine.   We discussed that his MRI preliminarily shows some spinal changes. He reports that 10 years ago he had very similar arm pain and had surgery on a disc in his neck that completely alleviated the pain... until now.  Inpatient Medications    Scheduled Meds:  amLODipine  10 mg Oral q morning   aspirin EC  81 mg Oral Daily   carvedilol  12.5 mg Oral BID WC   clopidogrel  75 mg Oral Daily   ferrous sulfate  325 mg Oral Daily   heparin  5,000 Units Subcutaneous Q8H   insulin detemir  20 Units Subcutaneous Daily   isosorbide mononitrate  120 mg Oral Daily   linagliptin  5 mg Oral Daily   melatonin  3 mg Oral QHS   rosuvastatin  40 mg Oral Daily   sodium chloride flush  3 mL Intravenous Q12H   Continuous Infusions:  PRN Meds: acetaminophen, nitroGLYCERIN, ondansetron (ZOFRAN) IV, traMADol   Vital Signs    Vitals:   11/29/22 0728 11/29/22 0730 11/29/22 1000 11/29/22 1130  BP: (!) 144/93  (!) 156/97   Pulse: 65  65   Resp:   19   Temp:  98.3 F (36.8 C)  98.1 F (36.7 C)  TempSrc:  Oral  Oral  SpO2:   93%   Weight:      Height:       No intake or output data in the 24 hours ending 11/29/22 1146    11/28/2022    1:41 PM 11/16/2022    3:09 AM 07/02/2022   10:58 AM  Last 3 Weights  Weight (lbs) 179 lb 179 lb 177 lb 12.8 oz  Weight (kg) 81.194 kg 81.194 kg 80.65 kg     Telemetry    NSR - Personally Reviewed   Physical Exam   GEN: No acute distress.  HEENT: Normocephalic, atraumatic, sclera non-icteric. Neck: No JVD or bruits. Cardiac: RRR no murmurs, rubs, or gallops.  Respiratory: Clear to auscultation bilaterally. Breathing is unlabored. GI:  Soft, nontender, non-distended, BS +x 4. MS: no deformity. Extremities: No clubbing or cyanosis. No edema. Distal pedal pulses are 2+ and equal bilaterally. Neuro:  AAOx3. Follows commands. Psych:  Responds to questions appropriately with a normal affect.  Labs    High Sensitivity Troponin:   Recent Labs  Lab 11/16/22 0515 11/28/22 1402 11/28/22 1642 11/28/22 2357 11/29/22 0050  TROPONINIHS 9 9 10 13 12      $ Cardiac EnzymesNo results for input(s): "TROPONINI" in the last 168 hours. No results for input(s): "TROPIPOC" in the last 168 hours.   Chemistry Recent Labs  Lab 11/28/22 1402 11/28/22 2357  NA 140  --   K 3.5  --   CL 107  --   CO2 21*  --   GLUCOSE 230*  --   BUN 15  --   CREATININE 1.36* 1.30*  CALCIUM 9.1  --   GFRNONAA 55* 58*  ANIONGAP 12  --      Hematology Recent Labs  Lab 11/28/22 1402 11/28/22 2357  WBC 6.6 5.6  RBC 4.85 4.80  HGB 14.1 14.2  HCT 42.3 41.3  MCV 87.2 86.0  MCH 29.1 29.6  MCHC 33.3 34.4  RDW 15.5 15.5  PLT 153 149*    BNPNo results for input(s): "BNP", "PROBNP" in the last 168 hours.   DDimer No results for input(s): "DDIMER" in the last 168 hours.   Radiology    MR CERVICAL SPINE WO CONTRAST  Result Date: 11/29/2022 CLINICAL DATA:  Chest pain, left arm pain, shoulder pain EXAM: MRI CERVICAL SPINE WITHOUT CONTRAST TECHNIQUE: Multiplanar, multisequence MR imaging of the cervical spine was performed. No intravenous contrast was administered. COMPARISON:  MRI cervical spine 06/09/2007, correlation is also made with CT cervical spine 12/29/2012 and CTA head neck 11/16/2022 FINDINGS: Alignment: 3 mm retrolisthesis of C3 on C4. 3 mm anterolisthesis of C4 on C5. 4 mm anterolisthesis of C7 on T1. Vertebrae: Heterogeneously increased T2 signal is noted in the C5 vertebral body, which appears unchanged in contour and height compared to to 01/01/2023, favored to be degenerative. Status post ACDF C6-C7. Congenitally short pedicles, which  narrow the AP diameter of the spinal canal. Cord: Small amount of increased T2 signal in the left aspect of the spinal cord at C7-T1 (series 8, image 38), which was not definitively present in 2008. Otherwise normal morphology and signal. Posterior Fossa, vertebral arteries, paraspinal tissues: Negative. Disc levels: C2-C3: No significant disc bulge. No spinal canal stenosis or neuroforaminal narrowing. C3-C4: Trace retrolisthesis. Facet and uncovertebral hypertrophy. Ligamentum flavum hypertrophy. Moderate spinal canal stenosis with moderate right and mild left neural foraminal narrowing, which have progressed from the prior exam. C4-C5: Trace anterolisthesis with disc unroofing. Facet and uncovertebral hypertrophy. Moderate spinal canal stenosis and moderate to severe bilateral neural foraminal narrowing, which have progressed from the prior exam. C5-C6: Minimal disc bulge. Facet and uncovertebral hypertrophy. Mild spinal canal stenosis and mild bilateral neural foraminal narrowing, which appears similar to the prior exam. C6-C7: Status post ACDF. No spinal canal stenosis or neural foraminal narrowing. C7-T1: Anterolisthesis with disc unroofing. Ligamentum flavum hypertrophy. Facet and uncovertebral hypertrophy. Moderate to severe spinal canal stenosis and moderate bilateral neural foraminal narrowing, which have progressed since the prior exam. IMPRESSION: 1. 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 2. C4-C5 moderate spinal canal stenosis and moderate to severe bilateral neural foraminal narrowing. 3. C3-C4 moderate spinal canal stenosis with moderate right and mild left neural foraminal narrowing. 4. C5-C6 mild spinal canal stenosis and mild bilateral neural foraminal narrowing.  Electronically Signed   By: Merilyn Baba M.D.   On: 11/29/2022 03:09   DG Shoulder Left  Result Date: 11/28/2022 CLINICAL DATA:  Left arm pain. EXAM: LEFT SHOULDER - 2+ VIEW COMPARISON:  None Available. FINDINGS: There is no evidence of an acute fracture or dislocation. Chronic changes are seen along the lateral aspect of the left acromion. Moderate severity degenerative changes seen involving the left acromioclavicular joint and left glenohumeral articulation. Soft tissues are unremarkable. Multiple sternal wires and vascular clips are seen. IMPRESSION: Moderate severity degenerative changes involving the left acromioclavicular joint and left glenohumeral articulation. Electronically Signed   By: Virgina Norfolk M.D.   On: 11/28/2022 23:09   CT HEAD WO CONTRAST (5MM)  Result Date: 11/28/2022 CLINICAL DATA:  Headache, increasing frequency or severity EXAM: CT HEAD WITHOUT CONTRAST TECHNIQUE: Contiguous axial images were obtained from the base of the skull through the vertex without intravenous contrast. RADIATION DOSE REDUCTION: This  exam was performed according to the departmental dose-optimization program which includes automated exposure control, adjustment of the mA and/or kV according to patient size and/or use of iterative reconstruction technique. COMPARISON:  11/16/2022 FINDINGS: Brain: There is periventricular white matter decreased attenuation consistent with small vessel ischemic changes. Gray-white differentiation is preserved. No acute intracranial hemorrhage, mass effect or shift. No hydrocephalus. Vascular: No hyperdense vessel or unexpected calcification. Skull: Normal. Negative for fracture or focal lesion. Sinuses/Orbits: No acute finding. IMPRESSION: Periventricular white matter changes consistent with chronic small vessel ischemia. No acute intracranial process identified. Electronically Signed   By: Sammie Bench M.D.   On: 11/28/2022 16:10   DG Chest 2 View  Result Date:  11/28/2022 CLINICAL DATA:  Chest pain. EXAM: CHEST - 2 VIEW COMPARISON:  June 26, 2022. FINDINGS: The heart size and mediastinal contours are within normal limits. Status post coronary bypass graft. Minimal bibasilar subsegmental atelectasis is noted. The visualized skeletal structures are unremarkable. IMPRESSION: Minimal bibasilar subsegmental atelectasis. Electronically Signed   By: Marijo Conception M.D.   On: 11/28/2022 14:34    Cardiac Studies   Echo 11/16/22   1. No apical thrombus with Definity contrast. Left ventricular ejection  fraction, by estimation, is 70 to 75%. Left ventricular ejection fraction  by PLAX is 74 %. The left ventricle has hyperdynamic function. The left  ventricle has no regional wall  motion abnormalities. There is mild left ventricular hypertrophy. Left  ventricular diastolic parameters are consistent with Grade I diastolic  dysfunction (impaired relaxation).   2. Right ventricular systolic function is mildly reduced. The right  ventricular size is normal. There is mildly elevated pulmonary artery  systolic pressure. The estimated right ventricular systolic pressure is  99991111 mmHg.   3. The mitral valve is abnormal. Mild mitral valve regurgitation.   4. The aortic valve is tricuspid. Aortic valve regurgitation is not  visualized.   5. The inferior vena cava is normal in size with <50% respiratory  variability, suggesting right atrial pressure of 8 mmHg.   Comparison(s): Changes from prior study are noted. 07/20/2021: LVEF 55-60%.   Patient Profile     77 y.o. male with  CAD (CABG 1996, POBA of [place], NSTEMI 2012 treated medically, last stent 2020 of SVG-diagonal and SVG-PDA), history of intermittent chest pain, recent stroke, ED, GERD, HTN, HLD, prostate CA, DM2, BPH, arthritis, hematuria, CKD 3a, prior alcohol abuse  presented to ED with CP and persistent L arm pain.  Assessment & Plan    1. Chest pain, arm pain with mixed atypical/typical  features, known complex hx of CAD s/p prior CABG, PCIs - some of his chest pain sounded more like stable angina, but primary complaint of left arm pain sounded more MSK - xray shows degenerative joint changes and MRI c-spine with multilevel spinal canal stenosis - further w/u per primary team - from cardiac standpoint, continue DAPT, amlodipine, Imdur; metoprolol transitioned to carvedilol yesterday PM for intensification of anginal regimen - hsTroponins negative x4, arguing against ACS - will review final cardiac recs with MD   2. Recent stroke - on ASA + Plavix at this time   3. Essential HTN - BP 130s-150s, follow with transition to carvedilol  4. CKD stage 3a  - baseline Cr appears 1.1-1.4, was 1.36 on admission, similar to recent discharge  Has f/u appt with Dr. Martinique 3/18 - will clarify with Dr. Sallyanne Kuster whether this will suffice for f/u.  For questions or updates, please contact [place]  Please consult [email or web address] for contact info under Cardiology/STEMI.  Signed, Charlie Pitter, PA-C 11/29/2022, 11:46 AM     I have seen and examined the patient along with Charlie Pitter, PA-C.  I have reviewed the chart, notes and new data.  I agree with PA/NP's note.  Key new complaints: Continues to have positional chest pain in the left side of his neck and left shoulder.  His pain is not consistent with angina pectoris. Key examination changes: BP is a little high but he is in pain.  Just switched from metoprolol to carvedilol for better antihypertensive effect (also a superior antianginal for his exertional angina). Key new findings / data: MRI shows moderate to severe spinal canal stenosis C7-T1 with bilateral neural foraminal stenosis, worse compared to previous MRI.  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  I think this can explain not only his left shoulder pain and his neck pain, but might even have something to do with his right hemiparesis during his  presentation a week ago, which was interpreted as representing a stroke.  He had severe neck pain at that time as well.  I am concerned that all his recent neurological complaints are related to spinal cord compression.  PLAN: Very low suspicion for acute coronary syndrome.  Adjusting medications to help treat his blood pressure better, with elevation in blood pressure partly related to his pain. Recommend neurosurgical evaluation.  It's quite possible he will need spine surgery. If he needs spine surgery and has to interrupt dual antiplatelet therapy temporarily, I think this can be done with reasonable (low-moderate) risk of cardiovascular complications.  His beta-blocker should not be discontinued in the perioperative period.  Sanda Klein, MD, [place] [phone] 11/29/2022, 1:24 PM

## 2022-11-29 NOTE — Inpatient Diabetes Management (Signed)
Inpatient Diabetes Program Recommendations  AACE/ADA: New Consensus Statement on Inpatient Glycemic Control  Target Ranges:  Prepandial:   less than 140 mg/dL      Peak postprandial:   less than 180 mg/dL (1-2 hours)      Critically ill patients:  140 - 180 mg/dL    Latest Reference Range & Units 11/28/22 17:53  Glucose-Capillary 70 - 99 mg/dL 60 (L)    Latest Reference Range & Units 11/28/22 14:02  Glucose 70 - 99 mg/dL 230 (H)   Review of Glycemic Control  Diabetes history: DM2 Outpatient Diabetes medications: Levemir 20 units daily, Metformin 1000 mg BID, Januvia 100 mg QAM Current orders for Inpatient glycemic control: Leveimr 20 units daily, Tradjenta 5 mg daily  Inpatient Diabetes Program Recommendations:    Insulin: Please consider ordering CBGs AC&HS and Novolog 0-9 units AC&HS.  NOTE: Patient currently in ED being admitted with atypical chest pain, left arm pain, HTN, and hyperlipidemia. Noted glucose of 60 mg/dl at 17:53 on 2/15/824 and no lab glucose or finger stick glucose since then. Patient received Levemir 20 units and Tradjenta this morning already.   Thanks, Barnie Alderman, RN, MSN, [place] [facility] Coordinator Inpatient Diabetes Program [phone] (Team Pager from 8am to [place])

## 2022-11-29 NOTE — ED Notes (Signed)
ED doctor present to room, pt assessed. Cardiologist aware and will come assess pt. Pt A&Ox4 at this time.

## 2022-11-29 NOTE — ED Notes (Signed)
Patient transported to MRI 

## 2022-11-29 NOTE — Assessment & Plan Note (Signed)
Chest pain is atypical. Troponins have been negative. Left shoulder pain is likely musculoskeletal vs radicular in nature and not related to CAD. The patient has had multiple investigations for ischemia in the last 2-3 years. Cardiology feels that there is no acute need for further investigation.

## 2022-11-29 NOTE — Assessment & Plan Note (Addendum)
The patient states that about 10 years ago he had similar symptoms of pain in his neck with radiation to his shoulder. He states that he saw a neurosurgeon who provided a procedure on his neck that helped. He states that the pain then was very similar to his pain now. Will check a CT of the patient's neck and shoulder. May also need an MRI. Consider consult to neurosurgery pending the results of imagery.

## 2022-11-29 NOTE — Progress Notes (Signed)
Approximately 1500--Pt arrived to room 6 from ED. Upon arrival, pt A&O x 4. Full skin assessment completed by this RN and Nate, RN. VSS. Pt complains of mild discomfort in bilateral shoulders, but says that it has improved since this admission. MD notified of pt's arrival.

## 2022-11-29 NOTE — ED Provider Notes (Signed)
  Physical Exam  BP (!) 158/89   Pulse 64   Temp 98.1 F (36.7 C) (Oral)   Resp 18   Ht 5' 7.5" (1.715 m)   Wt 81.2 kg   SpO2 96%   BMI 27.62 kg/m   Physical Exam  Procedures  Procedures  ED Course / MDM    Medical Decision Making Amount and/or Complexity of Data Reviewed Radiology: ordered.  Risk OTC drugs. Decision regarding hospitalization.   Nurse called me to see patient.  Has been admitted for 24 hours but reported no chest pain.  Cardiology was just in room.  Patient states he has some pressure in his chest, the arm.  Will get EKG and have primary team's consult       Davonna Belling, MD 11/29/22 1258

## 2022-11-29 NOTE — ED Notes (Signed)
Present to room per pt request. Pt c/o chest tightness, weakness, left arm pain. Blood sugar result 110, EKG obtained, ED doctor summoned to room.

## 2022-11-30 DIAGNOSIS — M4802 Spinal stenosis, cervical region: Secondary | ICD-10-CM

## 2022-11-30 DIAGNOSIS — R0789 Other chest pain: Secondary | ICD-10-CM | POA: Diagnosis not present

## 2022-11-30 DIAGNOSIS — I10 Essential (primary) hypertension: Secondary | ICD-10-CM

## 2022-11-30 DIAGNOSIS — I2510 Atherosclerotic heart disease of native coronary artery without angina pectoris: Secondary | ICD-10-CM

## 2022-11-30 LAB — BASIC METABOLIC PANEL
Anion gap: 4 — ABNORMAL LOW (ref 5–15)
BUN: 14 mg/dL (ref 8–23)
CO2: 29 mmol/L (ref 22–32)
Calcium: 8.7 mg/dL — ABNORMAL LOW (ref 8.9–10.3)
Chloride: 102 mmol/L (ref 98–111)
Creatinine, Ser: 1.21 mg/dL (ref 0.61–1.24)
GFR, Estimated: >60 mL/min (ref >60)
Glucose, Bld: 115 mg/dL — ABNORMAL HIGH (ref 70–99)
Potassium: 3.7 mmol/L (ref 3.5–5.1)
Sodium: 135 mmol/L (ref 135–145)

## 2022-11-30 LAB — GLUCOSE, CAPILLARY
Glucose-Capillary: 130 mg/dL — ABNORMAL HIGH (ref 70–99)
Glucose-Capillary: 173 mg/dL — ABNORMAL HIGH (ref 70–99)

## 2022-11-30 LAB — CBC WITH DIFFERENTIAL/PLATELET
Abs Immature Granulocytes: 0.03 10*3/uL (ref 0.00–0.07)
Basophils Absolute: 0 10*3/uL (ref 0.0–0.1)
Basophils Relative: 0 %
Eosinophils Absolute: 0.2 10*3/uL (ref 0.0–0.5)
Eosinophils Relative: 4 %
HCT: 40.1 % (ref 39.0–52.0)
Hemoglobin: 13.3 g/dL (ref 13.0–17.0)
Immature Granulocytes: 1 %
Lymphocytes Relative: 30 %
Lymphs Abs: 1.8 10*3/uL (ref 0.7–4.0)
MCH: 29 pg (ref 26.0–34.0)
MCHC: 33.2 g/dL (ref 30.0–36.0)
MCV: 87.4 fL (ref 80.0–100.0)
Monocytes Absolute: 0.5 10*3/uL (ref 0.1–1.0)
Monocytes Relative: 8 %
Neutro Abs: 3.4 10*3/uL (ref 1.7–7.7)
Neutrophils Relative %: 57 %
Platelets: 151 10*3/uL (ref 150–400)
RBC: 4.59 MIL/uL (ref 4.22–5.81)
RDW: 15.4 % (ref 11.5–15.5)
WBC: 6 10*3/uL (ref 4.0–10.5)
nRBC: 0 % (ref 0.0–0.2)

## 2022-11-30 MED ORDER — CARVEDILOL 12.5 MG PO TABS: 12.5000 mg | ORAL_TABLET | Freq: Two times a day (BID) | ORAL | 0 refills | Status: DC

## 2022-11-30 MED ORDER — OXYCODONE HCL 5 MG PO TABS: 5.0000 mg | ORAL_TABLET | Freq: Four times a day (QID) | ORAL | 0 refills | Status: DC | PRN

## 2022-11-30 MED ORDER — CARVEDILOL 12.5 MG PO TABS: 12.5000 mg | ORAL_TABLET | Freq: Two times a day (BID) | ORAL | 0 refills | Status: AC

## 2022-11-30 NOTE — Discharge Summary (Signed)
Physician Discharge Summary   Patient: Antonio [name] MRN: [id number] DOB: 1946/06/06  Admit date:     11/28/2022  Discharge date: 11/30/22  Discharge Physician: Patrecia Pour   PCP: [name], MD   Recommendations at discharge:  Follow up with PCP in 1-2 weeks Follow up with cardiology per routine Follow up with neurosurgery, referred to [facility] for cervical spine stenosis without myelopathy at this time. Case discussed with, and images reviewed by, Dr. Christella Noa on the day of discharge. After PDMP review, prescribed oxycodone IR 42m #10 in replacement for tramadol.  Discharge Diagnoses: Principal Problem:   Atypical chest pain Active Problems:   Type II diabetes mellitus (HCC)   Hyperlipidemia associated with type 2 diabetes mellitus [place]   Essential hypertension   CAD in native artery   S/P CABG (coronary artery bypass graft)   Hypertensive heart disease without heart failure   Neck pain with history of cervical spinal surgery   Cervical spinal stenosis  Hospital Course: TNOBUO ROSETEis a 77y.o. male with a history of CAD s/p CABG, HTN, HLD, T2DM, prostate CA s/p radiation, GERD, recent admit for CVA treated with TNK, stage IIIa CKD, and cervical fusion who presented to the ED with left sided chest pressure usually exertional. Also had pain radiating down left arm occasionally and neck/shoulder pain as well. He was admitted for concern of angina, though ACS has been ruled out with negative cardiac enzymes. Cardiology recommends no ischemic evaluation, continue aspirin and plavix. 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 Neurosurgery evaluated the patient's images and reviewed the case, recommending symptomatic management and outpatient follow up. The patient's pain has improved significantly with low dose oxycodone, a short course of  which is prescribed at discharge pending PCP and/or neurosurgery follow up.    Assessment and Plan: Chest pain, CAD s/p CABG, PCI's: Separate components of pain with different etiologies most likely. Given degenerative cervical spine disease and multilevel stenosis, as well as relief of pain with oxycodone, the spine is likely cause of much of his pain. High sensitivity troponin is negative x4.  - Cardiology recommended coreg which is prescribed at discharge in addition to other cardiac medications unchanged from PTA.   History of CVA:  - Continue DAPT x3 weeks per neurology recommendations, ok/also recommended by cardiology.   Cervical spinal stenosis: He currently has intact strength and sensation throughout.  - Consulted neurosurgery, Dr. CChristella Noa who recommends outpatient follow up.  - Oxycodone prescribed short course as discussed above  HLD, HTN, T2DM: No changes to management.   Consultants: Cardiology Procedures performed: None  Disposition: Home Diet recommendation:  Cardiac and Carb modified diet DISCHARGE MEDICATION: Allergies as of 11/30/2022       Reactions   Demerol Other (See Comments)   hallucinations        Medication List     STOP taking these medications    traMADol 50 MG tablet Commonly known as: ULTRAM       TAKE these medications    acetaminophen 650 MG CR tablet Commonly known as: TYLENOL Take 650-1,300 mg by mouth every 8 (eight) hours as needed for pain.   ALPRAZolam 0.25 MG tablet Commonly known as: XANAX Take 0.25 mg by mouth 2 (two) times daily as needed for anxiety.   amLODipine 10 MG tablet Commonly known as: NORVASC Take 1 tablet (10 mg total) by mouth  every morning.   Aspirin Low Dose 81 MG tablet Generic drug: aspirin EC Take 1 tablet (81 mg total) by mouth daily. Swallow whole.   carvedilol 12.5 MG tablet Commonly known as: COREG Take 1 tablet (12.5 mg total) by mouth 2 (two) times daily with a meal.   clopidogrel 75 MG  tablet Commonly known as: PLAVIX Take 1 tablet (75 mg total) by mouth daily.   ferrous sulfate 325 (65 FE) MG tablet Take 1 tablet (325 mg total) by mouth daily.   FreeStyle [place] 2 Reader Kerrin Mo See admin instructions.   FreeStyle [employer] 2 Sensor Misc See admin instructions.   Insulin Pen Needle 32G X 4 MM Misc Commonly known as: BD Pen Needle Nano U/F 1 pen by Does not apply route daily.   isosorbide mononitrate 120 MG 24 hr tablet Commonly known as: IMDUR TAKE 1 TABLET (120 MG TOTAL) BY MOUTH DAILY.   Levemir FlexTouch 100 UNIT/ML FlexPen Generic drug: insulin detemir Inject 20 Units into the skin daily as needed (CBG >130).   metFORMIN 1000 MG tablet Commonly known as: GLUCOPHAGE Take 1,000 mg by mouth 2 (two) times daily with a meal.   multivitamin with minerals Tabs tablet Take 1 tablet by mouth every morning.   nitroGLYCERIN 0.4 MG SL tablet Commonly known as: NITROSTAT PLACE 1 TABLET UNDER THE TONGUE EVERY 5 MINUTES FOR 3 DOSES AS NEEDED FOR CHEST PAIN What changed: See the new instructions.   oxyCODONE 5 MG immediate release tablet Commonly known as: Oxy IR/ROXICODONE Take 1 tablet (5 mg total) by mouth every 6 (six) hours as needed for severe pain.   rosuvastatin 40 MG tablet Commonly known as: CRESTOR TAKE 1 TABLET (40 MG TOTAL) BY MOUTH DAILY.   sitaGLIPtin 100 MG tablet Commonly known as: JANUVIA Take 100 mg by mouth every morning.        Follow-up Information     [name], MD Follow up.   Specialty: Internal Medicine Contact information: [place], [place] [address] [phone]         Juana Di[place]az, [place]. Schedule an appointment as soon as possible for a visit.   Specialty: Neurosurgery Why: to discuss cervial spine arthritis, stenosis. Contact information: [address] [place] [address] [phone]                Discharge Exam: Danley Danker Weights   11/28/22 1341  11/29/22 1502 11/30/22 0021  Weight: 81.2 kg 80.8 kg 80.8 kg  BP 119/80 (BP Location: Left Arm)   Pulse (!) 53   Temp 98 F (36.7 C) (Oral)   Resp 20   Ht 5' 7"$  (1.702 m)   Wt 80.8 kg   SpO2 96%   BMI 27.90 kg/m   Well-appearing male in no distress clear, nonlabored RRR, no MRG or JVD or edema UE's with full AROM, no sensory or motor deficits  Condition at discharge: stable  The results of significant diagnostics from this hospitalization (including imaging, microbiology, ancillary and laboratory) are listed below for reference.   Imaging Studies: CT CERVICAL SPINE WO CONTRAST  Result Date: 11/29/2022 CLINICAL DATA:  Neck pain. EXAM: CT CERVICAL SPINE WITHOUT CONTRAST TECHNIQUE: Multidetector CT imaging of the cervical spine was performed without intravenous contrast. Multiplanar CT image reconstructions were also generated. RADIATION DOSE REDUCTION: This exam was performed according to the departmental dose-optimization program which includes automated exposure control, adjustment of the mA and/or kV according to patient size and/or use of  iterative reconstruction technique. COMPARISON:  MRI cervical spine 11/29/2022 FINDINGS: Alignment: Degenerative anterior subluxation of C7 compared to T1 of approximately 4 mm. There is also mild degenerative anterior subluxation of C4 compared to C5 maximum 2 mm. Skull base and vertebrae: No acute fracture. No primary bone lesion or focal pathologic process. Soft tissues and spinal canal: No prevertebral fluid or swelling. No visible canal hematoma. Disc levels:  C2-3: No significant findings. C3-4: Advanced degenerative disc disease with osteophytic ridging and uncinate spurring contributing to mild spinal and mild bilateral foraminal stenosis. C4-5: Bulging annulus, osteophytic ridging, uncinate spurring and facet disease contributing to mild spinal and moderate bilateral foraminal stenosis. C5-6: Severe degenerative disc disease with a bulging  degenerated annulus, osteophytic ridging and uncinate spurring. No significant spinal stenosis. Mild to moderate bilateral foraminal stenosis. C6-7: Anterior and interbody fusion changes. No spinal or foraminal stenosis. C7-T1: Advanced degenerate disc disease and facet disease. Broad-based bulging uncovered disc, osteophytic ridging, uncinate spurring and facet disease contributing to mild spinal stenosis and moderate bilateral foraminal stenosis. Upper chest: The lung apices are grossly clear. Other: No neck mass or adenopathy. IMPRESSION: 1. Anterior and interbody fusion changes at [id number] without complicating features. 2. 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 3. Mild spinal and moderate bilateral foraminal stenosis at C4-5. 4. Mild to moderate bilateral foraminal stenosis at C5-6. Electronically Signed   By: Marijo Sanes M.D.   On: 11/29/2022 14:35   MR CERVICAL SPINE WO CONTRAST  Result Date: 11/29/2022 CLINICAL DATA:  Chest pain, left arm pain, shoulder pain EXAM: MRI CERVICAL SPINE WITHOUT CONTRAST TECHNIQUE: Multiplanar, multisequence MR imaging of the cervical spine was performed. No intravenous contrast was administered. COMPARISON:  MRI cervical spine 06/09/2007, correlation is also made with CT cervical spine 12/29/2012 and CTA head neck 11/16/2022 FINDINGS: Alignment: 3 mm retrolisthesis of C3 on C4. 3 mm anterolisthesis of C4 on C5. 4 mm anterolisthesis of C7 on T1. Vertebrae: Heterogeneously increased T2 signal is noted in the C5 vertebral body, which appears unchanged in contour and height compared to to 01/01/2023, favored to be degenerative. Status post ACDF C6-C7. Congenitally short pedicles, which narrow the AP diameter of the spinal canal. Cord: Small amount of increased T2 signal in the left aspect of the spinal cord at C7-T1 (series 8, image 38), which was not definitively present in 2008. Otherwise  normal morphology and signal. Posterior Fossa, vertebral arteries, paraspinal tissues: Negative. Disc levels: C2-C3: No significant disc bulge. No spinal canal stenosis or neuroforaminal narrowing. C3-C4: Trace retrolisthesis. Facet and uncovertebral hypertrophy. Ligamentum flavum hypertrophy. Moderate spinal canal stenosis with moderate right and mild left neural foraminal narrowing, which have progressed from the prior exam. C4-C5: Trace anterolisthesis with disc unroofing. Facet and uncovertebral hypertrophy. Moderate spinal canal stenosis and moderate to severe bilateral neural foraminal narrowing, which have progressed from the prior exam. C5-C6: Minimal disc bulge. Facet and uncovertebral hypertrophy. Mild spinal canal stenosis and mild bilateral neural foraminal narrowing, which appears similar to the prior exam. C6-C7: Status post ACDF. No spinal canal stenosis or neural foraminal narrowing. C7-T1: Anterolisthesis with disc unroofing. Ligamentum flavum hypertrophy. Facet and uncovertebral hypertrophy. Moderate to severe spinal canal stenosis and moderate bilateral neural foraminal narrowing, which have progressed since the prior exam. IMPRESSION: 1. 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 2. C4-C5 moderate spinal canal stenosis and moderate to severe bilateral neural foraminal narrowing. 3. C3-C4 moderate spinal canal stenosis with moderate right and mild left neural foraminal narrowing. 4. C5-C6 mild spinal canal stenosis and mild bilateral neural foraminal narrowing. Electronically Signed   By: Merilyn Baba M.D.   On: 11/29/2022 03:09   DG Shoulder Left  Result Date: 11/28/2022 CLINICAL DATA:  Left arm pain. EXAM: LEFT SHOULDER - 2+ VIEW COMPARISON:  None Available.  FINDINGS: There is no evidence of an acute fracture or dislocation. Chronic changes are seen along the lateral aspect of the left acromion. Moderate severity degenerative changes seen involving the left acromioclavicular joint and left glenohumeral articulation. Soft tissues are unremarkable. Multiple sternal wires and vascular clips are seen. IMPRESSION: Moderate severity degenerative changes involving the left acromioclavicular joint and left glenohumeral articulation. Electronically Signed   By: Virgina Norfolk M.D.   On: 11/28/2022 23:09   CT HEAD WO CONTRAST (5MM)  Result Date: 11/28/2022 CLINICAL DATA:  Headache, increasing frequency or severity EXAM: CT HEAD WITHOUT CONTRAST TECHNIQUE: Contiguous axial images were obtained from the base of the skull through the vertex without intravenous contrast. RADIATION DOSE REDUCTION: This exam was performed according to the departmental dose-optimization program which includes automated exposure control, adjustment of the mA and/or kV according to patient size and/or use of iterative reconstruction technique. COMPARISON:  11/16/2022 FINDINGS: Brain: There is periventricular white matter decreased attenuation consistent with small vessel ischemic changes. Gray-white differentiation is preserved. No acute intracranial hemorrhage, mass effect or shift. No hydrocephalus. Vascular: No hyperdense vessel or unexpected calcification. Skull: Normal. Negative for fracture or focal lesion. Sinuses/Orbits: No acute finding. IMPRESSION: Periventricular white matter changes consistent with chronic small vessel ischemia. No acute intracranial process identified. Electronically Signed   By: Sammie Bench M.D.   On: 11/28/2022 16:10   DG Chest 2 View  Result Date: 11/28/2022 CLINICAL DATA:  Chest pain. EXAM: CHEST - 2 VIEW COMPARISON:  June 26, 2022. FINDINGS: The heart size and mediastinal contours are within normal limits. Status post coronary bypass graft. Minimal  bibasilar subsegmental atelectasis is noted. The visualized skeletal structures are unremarkable. IMPRESSION: Minimal bibasilar subsegmental atelectasis. Electronically Signed   By: Marijo Conception M.D.   On: 11/28/2022 14:34   IR ANGIO INTRA EXTRACRAN SEL COM CAROTID INNOMINATE BILAT MOD SED  Result Date: 11/19/2022 CLINICAL DATA:  History of acute onset of right-sided weakness, decreased sensation and vertigo improving. 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 EXAM: BILATERAL COMMON CAROTID AND INNOMINATE ANGIOGRAPHY COMPARISON:  CTA of the head and neck November 16, 2022 MEDICATIONS: Heparin 2000 units IV. No antibiotic was administered within 1 hour of the procedure. ANESTHESIA/SEDATION: Versed 1 mg IV; Fentanyl 25 mcg IV Moderate Sedation Time:  34 minutes The patient was continuously monitored during the procedure by the interventional radiology nurse under my direct supervision. CONTRAST:  Omnipaque 300 65 mL. FLUOROSCOPY TIME:  Fluoroscopy Time: 10 minutes 36 seconds (1277 mGy). COMPLICATIONS: None immediate. TECHNIQUE: Informed written consent was obtained from the patient after a thorough discussion of the procedural risks, benefits and alternatives. All questions were addressed. Maximal Sterile Barrier Technique was utilized including caps, mask, sterile gowns, sterile gloves, sterile drape, hand hygiene and skin antiseptic. A timeout was performed prior to the initiation of the procedure. The right forearm to the wrist was prepped and  draped in the usual sterile manner. The right radial artery was then identified with ultrasound, and its morphology documented in the radiology PACS system. A dorsal palmar anastomosis was verified to be present. Using ultrasound guidance access into the right radial artery was obtained over an 018 inch micro guidewire with a 4/5 French radial sheath. The obturator, and the micro  guidewire were removed. Good aspiration was obtained from the side port of the radial sheath. A cocktail of 2000 units of heparin, 2.5 mg of verapamil and 200 mcg of nitroglycerin was then infused in diluted form without event. A right radial arteriogram was then performed. Over an 035 inch Roadrunner guidewire, a Simmons 2 5 [place] diagnostic catheter was advanced to the aortic arch region, and selectively positioned at the orifice of the right vertebral artery, the left common carotid artery, the right common carotid artery and the left vertebral artery. A wrist band was applied for hemostasis at the right radial puncture site. Distal right radial pulse was verified to be present. FINDINGS: Dominant right vertebral artery at its origin demonstrates patency. Distal to this the vessel is completely occluded. There is partial reconstitution at the level of C2 from collaterals arising from the ascending cervical branch of the thyrocervical trunk. Slow ascent of contrast is noted distal to this to the level of the occipital bone. The left common carotid arteriogram demonstrates the left external carotid artery and its major branches to be widely patent. The left internal carotid artery at the bulb demonstrates a smooth atherosclerotic plaque along the posterior wall of the bulb. No evidence of intraluminal filling defects, or significant stenosis is evident. The vessel is seen to opacify to the cranial skull base. The petrous, the cavernous and the supraclinoid left ICA demonstrate wide patency. Atherosclerotic disease is evident in the proximal supraclinoid left ICA. The left middle cerebral artery and the left anterior cerebral artery opacify into the capillary and venous phases. The right common carotid arteriogram demonstrates the right external carotid artery and its major branches to be widely patent. The right internal carotid artery at the bulb demonstrates a focal ulceration along its lateral aspect. Mild  tortuosity of the proximal 1/3 of the right internal carotid artery is seen. More distally, the vessel is seen to opacify to the cranial skull base. Patency is seen of the petrous, the cavernous and the supraclinoid right ICA. The right middle cerebral artery and the right anterior cerebral artery opacify into capillary and venous phases. The non dominant left vertebral artery is widely patent proximally. There is mild atherosclerotic narrowing of the proximal 1/3. More distally, the vessel is seen to opacify to the cranial skull base. The posterior cerebral arteries, the superior cerebellar arteries and the anterior-inferior cerebellar arteries demonstrate patency. There is retrograde opacification of the right vertebrobasilar junction to the level of the right posterior-inferior cerebellar artery. Left anterior-inferior cerebellar artery/posterior-inferior cerebellar artery developmental variation is noted. IMPRESSION: 1. Occluded dominant right vertebral artery proximally, with partial reconstitution at the level of C1-C2 with reocclusion at the level of the occipital bone. Findings most consistent with a dissection. 2. Retrograde opacification of the right vertebrobasilar junction from the left vertebral artery injection, to the level of the right posterior-inferior cerebellar artery. PLAN: Follow-up CT angiogram of the head and neck in 6 to 8 weeks. Electronically Signed   By: Luanne Bras M.D.   On: 11/19/2022 08:16   IR US Guide Vasc Access Right  Result Date: 11/19/2022 CLINICAL DATA:  History of acute  onset of right-sided weakness, decreased sensation and vertigo improving. 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 EXAM: BILATERAL COMMON CAROTID AND INNOMINATE ANGIOGRAPHY COMPARISON:  CTA of the head and neck November 16, 2022 MEDICATIONS: Heparin 2000 units IV. No antibiotic was administered within 1 hour of the  procedure. ANESTHESIA/SEDATION: Versed 1 mg IV; Fentanyl 25 mcg IV Moderate Sedation Time:  34 minutes The patient was continuously monitored during the procedure by the interventional radiology nurse under my direct supervision. CONTRAST:  Omnipaque 300 65 mL. FLUOROSCOPY TIME:  Fluoroscopy Time: 10 minutes 36 seconds (1277 mGy). COMPLICATIONS: None immediate. TECHNIQUE: Informed written consent was obtained from the patient after a thorough discussion of the procedural risks, benefits and alternatives. All questions were addressed. Maximal Sterile Barrier Technique was utilized including caps, mask, sterile gowns, sterile gloves, sterile drape, hand hygiene and skin antiseptic. A timeout was performed prior to the initiation of the procedure. The right forearm to the wrist was prepped and draped in the usual sterile manner. The right radial artery was then identified with ultrasound, and its morphology documented in the radiology PACS system. A dorsal palmar anastomosis was verified to be present. Using ultrasound guidance access into the right radial artery was obtained over an 018 inch micro guidewire with a 4/5 French radial sheath. The obturator, and the micro guidewire were removed. Good aspiration was obtained from the side port of the radial sheath. A cocktail of 2000 units of heparin, 2.5 mg of verapamil and 200 mcg of nitroglycerin was then infused in diluted form without event. A right radial arteriogram was then performed. Over an 035 inch Roadrunner guidewire, a Simmons 2 5 [place] diagnostic catheter was advanced to the aortic arch region, and selectively positioned at the orifice of the right vertebral artery, the left common carotid artery, the right common carotid artery and the left vertebral artery. A wrist band was applied for hemostasis at the right radial puncture site. Distal right radial pulse was verified to be present. FINDINGS: Dominant right vertebral artery at its origin demonstrates  patency. Distal to this the vessel is completely occluded. There is partial reconstitution at the level of C2 from collaterals arising from the ascending cervical branch of the thyrocervical trunk. Slow ascent of contrast is noted distal to this to the level of the occipital bone. The left common carotid arteriogram demonstrates the left external carotid artery and its major branches to be widely patent. The left internal carotid artery at the bulb demonstrates a smooth atherosclerotic plaque along the posterior wall of the bulb. No evidence of intraluminal filling defects, or significant stenosis is evident. The vessel is seen to opacify to the cranial skull base. The petrous, the cavernous and the supraclinoid left ICA demonstrate wide patency. Atherosclerotic disease is evident in the proximal supraclinoid left ICA. The left middle cerebral artery and the left anterior cerebral artery opacify into the capillary and venous phases. The right common carotid arteriogram demonstrates the right external carotid artery and its major branches to be widely patent. The right internal carotid artery at the bulb demonstrates a focal ulceration along its lateral aspect. Mild tortuosity of the proximal 1/3 of the right internal carotid artery is seen. More distally, the vessel is seen to opacify to the cranial skull base. Patency is seen of the petrous, the cavernous and the supraclinoid right ICA. The right middle cerebral artery and the right anterior cerebral artery opacify into capillary and  venous phases. The non dominant left vertebral artery is widely patent proximally. There is mild atherosclerotic narrowing of the proximal 1/3. More distally, the vessel is seen to opacify to the cranial skull base. The posterior cerebral arteries, the superior cerebellar arteries and the anterior-inferior cerebellar arteries demonstrate patency. There is retrograde opacification of the right vertebrobasilar junction to the level of  the right posterior-inferior cerebellar artery. Left anterior-inferior cerebellar artery/posterior-inferior cerebellar artery developmental variation is noted. IMPRESSION: 1. Occluded dominant right vertebral artery proximally, with partial reconstitution at the level of C1-C2 with reocclusion at the level of the occipital bone. Findings most consistent with a dissection. 2. Retrograde opacification of the right vertebrobasilar junction from the left vertebral artery injection, to the level of the right posterior-inferior cerebellar artery. PLAN: Follow-up CT angiogram of the head and neck in 6 to 8 weeks. Electronically Signed   By: Luanne Bras M.D.   On: 11/19/2022 08:16   IR ANGIO VERTEBRAL SEL SUBCLAVIAN INNOMINATE BILAT MOD SED  Result Date: 11/19/2022 CLINICAL DATA:  History of acute onset of right-sided weakness, decreased sensation and vertigo improving. 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 EXAM: BILATERAL COMMON CAROTID AND INNOMINATE ANGIOGRAPHY COMPARISON:  CTA of the head and neck November 16, 2022 MEDICATIONS: Heparin 2000 units IV. No antibiotic was administered within 1 hour of the procedure. ANESTHESIA/SEDATION: Versed 1 mg IV; Fentanyl 25 mcg IV Moderate Sedation Time:  34 minutes The patient was continuously monitored during the procedure by the interventional radiology nurse under my direct supervision. CONTRAST:  Omnipaque 300 65 mL. FLUOROSCOPY TIME:  Fluoroscopy Time: 10 minutes 36 seconds (1277 mGy). COMPLICATIONS: None immediate. TECHNIQUE: Informed written consent was obtained from the patient after a thorough discussion of the procedural risks, benefits and alternatives. All questions were addressed. Maximal Sterile Barrier Technique was utilized including caps, mask, sterile gowns, sterile gloves, sterile drape, hand hygiene and skin antiseptic. A timeout was performed prior to the  initiation of the procedure. The right forearm to the wrist was prepped and draped in the usual sterile manner. The right radial artery was then identified with ultrasound, and its morphology documented in the radiology PACS system. A dorsal palmar anastomosis was verified to be present. Using ultrasound guidance access into the right radial artery was obtained over an 018 inch micro guidewire with a 4/5 French radial sheath. The obturator, and the micro guidewire were removed. Good aspiration was obtained from the side port of the radial sheath. A cocktail of 2000 units of heparin, 2.5 mg of verapamil and 200 mcg of nitroglycerin was then infused in diluted form without event. A right radial arteriogram was then performed. Over an 035 inch Roadrunner guidewire, a Simmons 2 5 [place] diagnostic catheter was advanced to the aortic arch region, and selectively positioned at the orifice of the right vertebral artery, the left common carotid artery, the right common carotid artery and the left vertebral artery. A wrist band was applied for hemostasis at the right radial puncture site. Distal right radial pulse was verified to be present. FINDINGS: Dominant right vertebral artery at its origin demonstrates patency. Distal to this the vessel is completely occluded. There is partial reconstitution at the level of C2 from collaterals arising from the ascending cervical branch of the thyrocervical trunk. Slow ascent of contrast is noted distal to this to the level of the occipital bone. The left common carotid arteriogram demonstrates the left external carotid artery  and its major branches to be widely patent. The left internal carotid artery at the bulb demonstrates a smooth atherosclerotic plaque along the posterior wall of the bulb. No evidence of intraluminal filling defects, or significant stenosis is evident. The vessel is seen to opacify to the cranial skull base. The petrous, the cavernous and the supraclinoid left  ICA demonstrate wide patency. Atherosclerotic disease is evident in the proximal supraclinoid left ICA. The left middle cerebral artery and the left anterior cerebral artery opacify into the capillary and venous phases. The right common carotid arteriogram demonstrates the right external carotid artery and its major branches to be widely patent. The right internal carotid artery at the bulb demonstrates a focal ulceration along its lateral aspect. Mild tortuosity of the proximal 1/3 of the right internal carotid artery is seen. More distally, the vessel is seen to opacify to the cranial skull base. Patency is seen of the petrous, the cavernous and the supraclinoid right ICA. The right middle cerebral artery and the right anterior cerebral artery opacify into capillary and venous phases. The non dominant left vertebral artery is widely patent proximally. There is mild atherosclerotic narrowing of the proximal 1/3. More distally, the vessel is seen to opacify to the cranial skull base. The posterior cerebral arteries, the superior cerebellar arteries and the anterior-inferior cerebellar arteries demonstrate patency. There is retrograde opacification of the right vertebrobasilar junction to the level of the right posterior-inferior cerebellar artery. Left anterior-inferior cerebellar artery/posterior-inferior cerebellar artery developmental variation is noted. IMPRESSION: 1. Occluded dominant right vertebral artery proximally, with partial reconstitution at the level of C1-C2 with reocclusion at the level of the occipital bone. Findings most consistent with a dissection. 2. Retrograde opacification of the right vertebrobasilar junction from the left vertebral artery injection, to the level of the right posterior-inferior cerebellar artery. PLAN: Follow-up CT angiogram of the head and neck in 6 to 8 weeks. Electronically Signed   By: Luanne Bras M.D.   On: 11/19/2022 08:16   MR BRAIN WO CONTRAST  Result Date:  11/17/2022 CLINICAL DATA:  Stroke follow-up EXAM: MRI HEAD WITHOUT CONTRAST TECHNIQUE: Multiplanar, multiecho pulse sequences of the brain and surrounding structures were obtained without intravenous contrast. COMPARISON:  None Available. FINDINGS: Brain: Punctate ischemic focus in the paramedian right cerebellar hemisphere. No other diffusion abnormality. No chronic microhemorrhage or siderosis. There is multifocal hyperintense T2-weighted signal within the white matter. Parenchymal volume and CSF spaces are normal. The midline structures are normal. Vascular: Normal flow voids. Skull and upper cervical spine: Normal marrow signal. Sinuses/Orbits: Negative. Other: None. IMPRESSION: 1. Punctate ischemic focus in the paramedian right cerebellar hemisphere. No hemorrhage or mass effect. 2. Findings of chronic small vessel ischemia. Electronically Signed   By: Ulyses Jarred M.D.   On: 11/17/2022 00:21   ECHOCARDIOGRAM COMPLETE  Result Date: 11/16/2022    ECHOCARDIOGRAM REPORT   Patient Name:   Antonio [name] Date of Exam: 11/16/2022 Medical Rec #:  [id number]        Height:       67.5 in Accession #:    [id number]       Weight:       179.0 lb Date of Birth:  1946-03-20       BSA:          1.940 m Patient Age:    61 years         BP:           110/71 mmHg Patient Gender: M  HR:           73 bpm. Exam Location:  Inpatient Procedure: 2D Echo, Cardiac Doppler, Color Doppler and Intracardiac            Opacification Agent Indications:    I63.9 Stroke  History:        Patient has prior history of Echocardiogram examinations, most                 recent 07/20/2021. CAD, Prior CABG, Stroke and CKD,                 Signs/Symptoms:Chest Pain; Risk Factors:Former Smoker,                 Hypertension, Dyslipidemia and Diabetes.  Sonographer:    Wilkie Aye RVT RCS Referring Phys: [id number] [place]  Sonographer Comments: Suboptimal apical window. IMPRESSIONS  1. No apical thrombus with Definity contrast. Left  ventricular ejection fraction, by estimation, is 70 to 75%. Left ventricular ejection fraction by PLAX is 74 %. The left ventricle has hyperdynamic function. The left ventricle has no regional wall motion abnormalities. There is mild left ventricular hypertrophy. Left ventricular diastolic parameters are consistent with Grade I diastolic dysfunction (impaired relaxation).  2. Right ventricular systolic function is mildly reduced. The right ventricular size is normal. There is mildly elevated pulmonary artery systolic pressure. The estimated right ventricular systolic pressure is 99991111 mmHg.  3. The mitral valve is abnormal. Mild mitral valve regurgitation.  4. The aortic valve is tricuspid. Aortic valve regurgitation is not visualized.  5. The inferior vena cava is normal in size with <50% respiratory variability, suggesting right atrial pressure of 8 mmHg. Comparison(s): Changes from prior study are noted. 07/20/2021: LVEF 55-60%. FINDINGS  Left Ventricle: No apical thrombus with Definity contrast. Left ventricular ejection fraction, by estimation, is 70 to 75%. Left ventricular ejection fraction by PLAX is 74 %. The left ventricle has hyperdynamic function. The left ventricle has no regional wall motion abnormalities. The left ventricular internal cavity size was normal in size. There is mild left ventricular hypertrophy. Left ventricular diastolic parameters are consistent with Grade I diastolic dysfunction (impaired relaxation). Indeterminate filling pressures. Right Ventricle: The right ventricular size is normal. No increase in right ventricular wall thickness. Right ventricular systolic function is mildly reduced. There is mildly elevated pulmonary artery systolic pressure. The tricuspid regurgitant velocity  is 2.74 m/s, and with an assumed right atrial pressure of 8 mmHg, the estimated right ventricular systolic pressure is 99991111 mmHg. Left Atrium: Left atrial size was normal in size. Right Atrium: Right  atrial size was normal in size. Pericardium: There is no evidence of pericardial effusion. Mitral Valve: The mitral valve is abnormal. There is mild calcification of the mitral valve leaflet(s). Mild mitral annular calcification. Mild mitral valve regurgitation, with eccentric medially directed jet. Tricuspid Valve: The tricuspid valve is grossly normal. Tricuspid valve regurgitation is trivial. Aortic Valve: The aortic valve is tricuspid. Aortic valve regurgitation is not visualized. Aortic valve mean gradient measures 5.0 mmHg. Aortic valve peak gradient measures 11.2 mmHg. Aortic valve area, by VTI measures 2.08 cm. Pulmonic Valve: The pulmonic valve was grossly normal. Pulmonic valve regurgitation is trivial. Aorta: The aortic root and ascending aorta are structurally normal, with no evidence of dilitation. Venous: The inferior vena cava is normal in size with less than 50% respiratory variability, suggesting right atrial pressure of 8 mmHg. IAS/Shunts: No atrial level shunt detected by color flow Doppler.  LEFT VENTRICLE PLAX 2D  LV EF:         Left            Diastology                ventricular     LV e' medial:    6.55 cm/s                ejection        LV E/e' medial:  12.2                fraction by     LV e' lateral:   10.90 cm/s                PLAX is 74      LV E/e' lateral: 7.3                %. LVIDd:         4.00 cm LVIDs:         2.30 cm LV PW:         1.20 cm LV IVS:        1.20 cm LVOT diam:     1.80 cm LV SV:         63 LV SV Index:   33 LVOT Area:     2.54 cm  RIGHT VENTRICLE            IVC RV Basal diam:  3.20 cm    IVC diam: 1.90 cm RV Mid diam:    2.20 cm RV S prime:     9.33 cm/s TAPSE (M-mode): 1.5 cm LEFT ATRIUM             Index        RIGHT ATRIUM           Index LA diam:        3.80 cm 1.96 cm/m   RA Area:     16.10 cm LA Vol (A2C):   53.8 ml 27.73 ml/m  RA Volume:   42.80 ml  22.06 ml/m LA Vol (A4C):   36.4 ml 18.76 ml/m LA Biplane Vol: 44.3 ml 22.84 ml/m  AORTIC VALVE                      PULMONIC VALVE AV Area (Vmax):    2.01 cm      PV Vmax:          1.27 m/s AV Area (Vmean):   2.12 cm      PV Peak grad:     6.4 mmHg AV Area (VTI):     2.08 cm      PR End Diast Vel: 3.79 msec AV Vmax:           167.00 cm/s AV Vmean:          104.000 cm/s AV VTI:            0.303 m AV Peak Grad:      11.2 mmHg AV Mean Grad:      5.0 mmHg LVOT Vmax:         132.00 cm/s LVOT Vmean:        86.800 cm/s LVOT VTI:          0.248 m LVOT/AV VTI ratio: 0.82  AORTA Ao Root diam: 3.20 cm Ao Asc diam:  3.40 cm Ao Arch diam: 2.5 cm MITRAL VALVE               TRICUSPID  VALVE MV Area (PHT): 2.92 cm    TR Peak grad:   30.0 mmHg MV Decel Time: 260 msec    TR Vmax:        274.00 cm/s MR Peak grad: 41.7 mmHg MR Mean grad: 26.0 mmHg    SHUNTS MR Vmax:      323.00 cm/s  Systemic VTI:  0.25 m MR Vmean:     239.0 cm/s   Systemic Diam: 1.80 cm MV E velocity: 79.80 cm/s MV A velocity: 81.60 cm/s MV E/A ratio:  0.98 Lyman Bishop MD Electronically signed by Lyman Bishop MD Signature Date/Time: 11/16/2022/12:53:47 PM    Final    CT ANGIO HEAD NECK W WO CM (CODE STROKE)  Result Date: 11/16/2022 CLINICAL DATA:  Worsening pain after T NK EXAM: CT ANGIOGRAPHY HEAD AND NECK TECHNIQUE: Multidetector CT imaging of the head and neck was performed using the standard protocol during bolus administration of intravenous contrast. Multiplanar CT image reconstructions and MIPs were obtained to evaluate the vascular anatomy. Carotid stenosis measurements (when applicable) are obtained utilizing NASCET criteria, using the distal internal carotid diameter as the denominator. RADIATION DOSE REDUCTION: This exam was performed according to the departmental dose-optimization program which includes automated exposure control, adjustment of the mA and/or kV according to patient size and/or use of iterative reconstruction technique. CONTRAST:  70m OMNIPAQUE IOHEXOL 350 MG/ML SOLN COMPARISON:  CTA from approximately 2 hours prior FINDINGS: CT HEAD  FINDINGS Brain: No evidence of acute infarction, hemorrhage, hydrocephalus, extra-axial collection or mass lesion/mass effect. 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 Streak artifact limits posterior fossa assessment. Vascular: Not well assessed due to recent intravenous contrast administration Skull: Negative Sinuses/Orbits: Negative Review of the MIP images confirms the above findings CTA NECK FINDINGS Aortic arch: Stable and negative. Right carotid system: Stable atheromatous irregularity and plaque centered at the bifurcation. Left carotid system: Mild atheromatous change at the bifurcation. Vertebral arteries: No proximal subclavian stenosis. Abrupt occlusion at the right V1 segment without vessel collapse. There is less intense enhancement of the downstream vertebral than before with tubular area of non enhancement that could be propagating clot. The distal right V4 segment and right PICA are still enhancing. Diffusely patent subclavian and left vertebral arteries without irregularity/beading. Skeleton: Generalized degeneration. C6-7 ACDF with solid arthrodesis. Other neck: No acute finding Upper chest: No acute finding Review of the MIP images confirms the above findings CTA HEAD FINDINGS Anterior circulation: Atheromatous calcification along the carotid siphons. No branch occlusion, beading, or aneurysm. Posterior circulation: Poor flow in the right vertebral artery due to disease in the neck with superimposed atheromatous plaque. The right vertebral is still enhancing, as is the right PICA. The left vertebral and basilar arteries are widely patent and smoothly contoured. No branch occlusion or aneurysm. Venous sinuses: Unremarkable Anatomic variants: None significant Review of the MIP images confirms the above findings IMPRESSION: 1. Known right proximal vertebral occlusion with worsened flow in the reconstituted distal segments although the distal right V4 segment and right PICA remain  enhancing. The left vertebral and basilar are widely patent with no visible downstream emboli. 2. No intracranial hemorrhage. Electronically Signed   By: JJorje GuildM.D.   On: 11/16/2022 06:03   CT Angio Chest/Abd/Pel for Dissection W and/or Wo Contrast  Result Date: 11/16/2022 CLINICAL DATA:  Acute aortic syndrome suspected.  Chest pain. EXAM: CT ANGIOGRAPHY CHEST, ABDOMEN AND PELVIS TECHNIQUE: Non-contrast CT of the chest was initially obtained. Multidetector CT imaging through the chest, abdomen and pelvis  was performed using the standard protocol during bolus administration of intravenous contrast. Multiplanar reconstructed images and MIPs were obtained and reviewed to evaluate the vascular anatomy. RADIATION DOSE REDUCTION: This exam was performed according to the departmental dose-optimization program which includes automated exposure control, adjustment of the mA and/or kV according to patient size and/or use of iterative reconstruction technique. CONTRAST:  28m OMNIPAQUE IOHEXOL 350 MG/ML SOLN, 726mOMNIPAQUE IOHEXOL 350 MG/ML SOLN COMPARISON:  07/13/2021. FINDINGS: CTA CHEST FINDINGS Cardiovascular: The heart is enlarged and there is no pericardial effusion. Multi-vessel coronary artery calcifications are seen. There is atherosclerotic calcification of the aorta without evidence of aneurysm or dissection. The pulmonary trunk is normal in caliber. Mediastinum/Nodes: No mediastinal, hilar, or axillary lymphadenopathy. The thyroid gland, trachea, and esophagus are within normal limits. There is a small hiatal hernia. Lungs/Pleura: Atelectasis is present bilaterally. No effusion or pneumothorax. Musculoskeletal: Sternotomy wires are noted. Degenerative changes are present in the thoracic spine. No acute osseous abnormality. Review of the MIP images confirms the above findings. CTA ABDOMEN AND PELVIS FINDINGS VASCULAR Aorta: Normal caliber aorta without aneurysm, dissection, vasculitis or significant  stenosis. Aortic atherosclerosis. Celiac: Patent without evidence of aneurysm, dissection, vasculitis or significant stenosis. SMA: Patent without evidence of aneurysm, dissection, vasculitis or significant stenosis. Renals: Both renal arteries are patent without evidence of aneurysm, dissection, vasculitis, fibromuscular dysplasia or significant stenosis. IMA: Patent. Inflow: No evidence of aneurysm or dissection. There is atherosclerotic calcification and mural thrombus resulting in approximately 50% stenosis in the proximal external iliac artery on the left. Veins: No obvious venous abnormality within the limitations of this arterial phase study. Review of the MIP images confirms the above findings. NON-VASCULAR Hepatobiliary: No focal liver abnormality is seen. No gallstones, gallbladder wall thickening, or biliary dilatation. Pancreas: Unremarkable. No pancreatic ductal dilatation or surrounding inflammatory changes. Spleen: Normal in size without focal abnormality. Adrenals/Urinary Tract: The adrenal glands are within normal limits. There is a cyst in the mid left kidney. No definite renal calculus. Examination is limited due to excreted contrast at the renal pyramids bilaterally. No hydronephrosis. Small bladder diverticula are present bilaterally. Stomach/Bowel: Stomach is within normal limits. Appendix appears normal. No evidence of bowel wall thickening, distention, or inflammatory changes. No free air or pneumatosis. There is diffuse colonic diverticulosis without diverticulitis. Lymphatic: No abdominal or pelvic lymphadenopathy. Reproductive: Prostate gland is normal in size. Radiopaque beads are noted in the prostate gland. Other: No abdominopelvic ascites. There is a fat containing inguinal hernia on the left. Musculoskeletal: Degenerative changes in the lumbar spine. No acute osseous abnormality. Review of the MIP images confirms the above findings. IMPRESSION: 1. No evidence of aortic aneurysm or  dissection. 2. No acute process in the chest, abdomen, or pelvis. 3. Small hiatal hernia. 4. Colonic diverticulosis. 5. Bladder diverticula. 6. Cardiomegaly with coronary artery calcifications. Electronically Signed   By: LaBrett Fairy.D.   On: 11/16/2022 04:22   CT ANGIO HEAD NECK W WO CM (CODE STROKE)  Result Date: 11/16/2022 CLINICAL DATA:  Stroke suspected EXAM: CT ANGIOGRAPHY HEAD AND NECK TECHNIQUE: Multidetector CT imaging of the head and neck was performed using the standard protocol during bolus administration of intravenous contrast. Multiplanar CT image reconstructions and MIPs were obtained to evaluate the vascular anatomy. Carotid stenosis measurements (when applicable) are obtained utilizing NASCET criteria, using the distal internal carotid diameter as the denominator. RADIATION DOSE REDUCTION: This exam was performed according to the departmental dose-optimization program which includes automated exposure control, adjustment of the mA and/or  kV according to patient size and/or use of iterative reconstruction technique. CONTRAST:  70m OMNIPAQUE IOHEXOL 350 MG/ML SOLN COMPARISON:  Head CT from earlier today FINDINGS: CTA NECK FINDINGS Aortic arch: Unremarkable where minimally covered Right carotid system: Mild atheromatous plaque and luminal irregularity at the level of the bifurcation/bulb. No stenosis or dissection Left carotid system: Mild atheromatous plaque at the bifurcation and proximal ICA. No ulceration or significant stenosis. Vertebral arteries: The right vertebral artery is occluded with reconstitution in the upper neck. Although the occlusion is age indeterminate, the vessel does not appear collapsed and distally there is a straight interface suggesting recent occlusion. Cannot differentiate a vertebral dissection from atheromatous occlusion on this study. There is likely both retrograde flow and there is a muscular collateral the upper V2 segment. Skeleton: No acute finding Other  neck: No acute finding. Upper chest: No acute finding Review of the MIP images confirms the above findings CTA HEAD FINDINGS Anterior circulation: Atheromatous calcification along the carotid siphons. No branch occlusion, beading, or aneurysm. Posterior circulation: Faint flow in the atheromatous right V4 segment which may be retrograde. The left vertebral and basilar arteries are widely patent. No branch occlusion, beading, or aneurysm. Venous sinuses: Unremarkable Anatomic variants: None significant Review of the MIP images confirms the above findings These results were called by telephone at the time of interpretation on 11/16/2022 at 4:18 am to provider Dr [place]. IMPRESSION: 1. Recent appearing right V1 and V2 occlusion with less intense right vertebral flow at the vertebrobasilar junction. 2. No visible intracranial branch embolism. 3. Overall mild atherosclerosis in the head and neck. Electronically Signed   By: JJorje GuildM.D.   On: 11/16/2022 04:19   CT HEAD CODE STROKE WO CONTRAST  Result Date: 11/16/2022 CLINICAL DATA:  Code stroke.  Right-sided weakness EXAM: CT HEAD WITHOUT CONTRAST TECHNIQUE: Contiguous axial images were obtained from the base of the skull through the vertex without intravenous contrast. RADIATION DOSE REDUCTION: This exam was performed according to the departmental dose-optimization program which includes automated exposure control, adjustment of the mA and/or kV according to patient size and/or use of iterative reconstruction technique. COMPARISON:  None Available. FINDINGS: Brain: There is no mass, hemorrhage or extra-axial collection. The size and configuration of the ventricles and extra-axial CSF spaces are normal. The brain parenchyma is normal, without evidence of acute or chronic infarction. Vascular: No abnormal hyperdensity of the major intracranial arteries or dural venous sinuses. No intracranial atherosclerosis. Skull: The visualized skull base, calvarium and  extracranial soft tissues are normal. Sinuses/Orbits: No fluid levels or advanced mucosal thickening of the visualized paranasal sinuses. No mastoid or middle ear effusion. The orbits are normal. ASPECTS ([facility]) - Ganglionic level infarction (caudate, lentiform nuclei, internal capsule, insula, M1-M3 cortex): 7 - Supraganglionic infarction (M4-M6 cortex): 3 Total score (0-10 with 10 being normal): 10 IMPRESSION: 1. No acute intracranial abnormality. 2. ASPECTS is 10. These results were communicated to Dr. SLesleigh Noeat 3:36 am on 11/16/2022 by text page via the [facility] system. Electronically Signed   By: KUlyses JarredM.D.   On: 11/16/2022 03:36    Microbiology: Results for orders placed or performed during the hospital encounter of 11/16/22  MRSA Next Gen by PCR, Nasal     Status: None   Collection Time: 11/16/22  6:43 AM   Specimen: Nasal Mucosa; Nasal Swab  Result Value Ref Range Status   MRSA by PCR Next Gen NOT DETECTED NOT DETECTED Final  Comment: (NOTE) The GeneXpert MRSA Assay (FDA approved for NASAL specimens only), is one component of a comprehensive MRSA colonization surveillance program. It is not intended to diagnose MRSA infection nor to guide or monitor treatment for MRSA infections. Test performance is not FDA approved in patients less than 58 years old. Performed at [facility], [place] [address] [place], [place] [id number]     Labs: CBC: Recent Labs  Lab 11/28/22 1402 11/28/22 2357 11/30/22 0030  WBC 6.6 5.6 6.0  NEUTROABS 4.6  --  3.4  HGB 14.1 14.2 13.3  HCT 42.3 41.3 40.1  MCV 87.2 86.0 87.4  PLT 153 149* [id number]   Basic Metabolic Panel: Recent Labs  Lab 11/28/22 1402 11/28/22 2357 11/30/22 0030  NA 140  --  135  K 3.5  --  3.7  CL 107  --  102  CO2 21*  --  29  GLUCOSE 230*  --  115*  BUN 15  --  14  CREATININE 1.36* 1.30* 1.21  CALCIUM 9.1  --  8.7*   Liver Function Tests: No results for input(s):  "AST", "ALT", "ALKPHOS", "BILITOT", "PROT", "ALBUMIN" in the last 168 hours. CBG: Recent Labs  Lab 11/29/22 1301 11/29/22 1603 11/29/22 2144 11/30/22 0619 11/30/22 1117  GLUCAP 110* 189* 151* 130* 173*    Discharge time spent: greater than 30 minutes.  Signed: Patrecia Pour, MD [facility] 11/30/2022

## 2022-11-30 NOTE — TOC Transition Note (Addendum)
Transition of Care [facility]) - CM/SW Discharge Note   Patient Details  Name: Antonio [name] MRN: [id number] Date of Birth: 02/09/1946  Transition of [facility]) CM/SW Contact:  Tom-Johnson, Renea Ee, RN Phone Number: 11/30/2022, 9:47 AM   Clinical Narrative:     Patientis schedued for discharge today. All f/u appointments and instructions on AVS.  No TOC needs or recommendations noted. Family to transport at discharge. No further TOC needs noted.            Final next level of care: Home/Self Care Barriers to Discharge: Barriers Resolved   Patient Goals and CMS Choice CMS Medicare.gov Compare Post Acute Care list provided to:: Patient Choice offered to / list presented to : NA  Discharge Placement                  Patient to be transferred to facility by: Family      Discharge Plan and Services Additional resources added to the After Visit Summary for                  DME Arranged: N/A DME Agency: NA       HH Arranged: NA Honor Agency: NA        Social Determinants of Health (SDOH) Interventions SDOH Screenings   Depression (PHQ2-9): Low Risk  (02/10/2020)  Recent Concern: Depression (PHQ2-9) - Medium Risk (12/22/2019)  Tobacco Use: Medium Risk (11/28/2022)     Readmission Risk Interventions     No data to display

## 2022-11-30 NOTE — Progress Notes (Signed)
Rounding Note    Patient Name: Antonio [name] Date of Encounter: 11/30/2022  [place] Cardiologist: Peter Martinique, MD   Subjective   Feeling much better with oxycodone.  Denies CP.   Inpatient Medications    Scheduled Meds:  amLODipine  10 mg Oral q morning   aspirin EC  81 mg Oral Daily   carvedilol  12.5 mg Oral BID WC   clopidogrel  75 mg Oral Daily   ferrous sulfate  325 mg Oral Daily   heparin  5,000 Units Subcutaneous Q8H   insulin aspart  0-9 Units Subcutaneous TID WC   insulin detemir  20 Units Subcutaneous Daily   isosorbide mononitrate  120 mg Oral Daily   linagliptin  5 mg Oral Daily   melatonin  3 mg Oral QHS   rosuvastatin  40 mg Oral Daily   Continuous Infusions:  PRN Meds: acetaminophen, nitroGLYCERIN, ondansetron (ZOFRAN) IV, mouth rinse, oxyCODONE, traMADol   Vital Signs    Vitals:   11/29/22 2007 11/30/22 0021 11/30/22 0620 11/30/22 0740  BP: 119/76 122/77  119/80  Pulse: 64   (!) 53  Resp:    20  Temp: 97.9 F (36.6 C) 97.8 F (36.6 C) 98 F (36.7 C) 98 F (36.7 C)  TempSrc: Oral Oral Oral Oral  SpO2: 98%   96%  Weight:  80.8 kg    Height:        Intake/Output Summary (Last 24 hours) at 11/30/2022 0941 Last data filed at 11/30/2022 0700 Gross per 24 hour  Intake 600 ml  Output 1675 ml  Net -1075 ml      11/30/2022   12:21 AM 11/29/2022    3:02 PM 11/28/2022    1:41 PM  Last 3 Weights  Weight (lbs) 178 lb 2.1 oz 178 lb 2.1 oz 179 lb  Weight (kg) 80.8 kg 80.8 kg 81.194 kg      Telemetry    Sinus rhythm.  Sinus bradycardia.- Personally Reviewed  ECG    N/A- Personally Reviewed  Physical Exam   VS:  BP 119/80 (BP Location: Left Arm)   Pulse (!) 53   Temp 98 F (36.7 C) (Oral)   Resp 20   Ht 5' 7"$  (1.702 m)   Wt 80.8 kg   SpO2 96%   BMI 27.90 kg/m  , BMI Body mass index is 27.9 kg/m. GENERAL:  Well appearing HEENT: Pupils equal round and reactive, fundi not visualized, oral mucosa unremarkable NECK:   No jugular venous distention, waveform within normal limits, carotid upstroke brisk and symmetric, no bruits, no thyromegaly LUNGS:  Clear to auscultation bilaterally HEART:  RRR.  PMI not displaced or sustained,S1 and S2 within normal limits, no S3, no S4, no clicks, no rubs, no murmurs ABD:  Flat, positive bowel sounds normal in frequency in pitch, no bruits, no rebound, no guarding, no midline pulsatile mass, no hepatomegaly, no splenomegaly EXT:  2 plus pulses throughout, no edema, no cyanosis no clubbing SKIN:  No rashes no nodules NEURO:  Cranial nerves II through XII grossly intact, motor grossly intact throughout PSYCH:  Cognitively intact, oriented to person place and time   Labs    High Sensitivity Troponin:   Recent Labs  Lab 11/16/22 0515 11/28/22 1402 11/28/22 1642 11/28/22 2357 11/29/22 0050  TROPONINIHS 9 9 10 13 12     $ Chemistry Recent Labs  Lab 11/28/22 1402 11/28/22 2357 11/30/22 0030  NA 140  --  135  K 3.5  --  3.7  CL 107  --  102  CO2 21*  --  29  GLUCOSE 230*  --  115*  BUN 15  --  14  CREATININE 1.36* 1.30* 1.21  CALCIUM 9.1  --  8.7*  GFRNONAA 55* 58* >60  ANIONGAP 12  --  4*    Lipids  Recent Labs  Lab 11/29/22 0050  CHOL 117  TRIG 70  HDL 37*  LDLCALC 66  CHOLHDL 3.2    Hematology Recent Labs  Lab 11/28/22 1402 11/28/22 2357 11/30/22 0030  WBC 6.6 5.6 6.0  RBC 4.85 4.80 4.59  HGB 14.1 14.2 13.3  HCT 42.3 41.3 40.1  MCV 87.2 86.0 87.4  MCH 29.1 29.6 29.0  MCHC 33.3 34.4 33.2  RDW 15.5 15.5 15.4  PLT 153 149* 151   Thyroid No results for input(s): "TSH", "FREET4" in the last 168 hours.  BNPNo results for input(s): "BNP", "PROBNP" in the last 168 hours.  DDimer No results for input(s): "DDIMER" in the last 168 hours.   Radiology    CT CERVICAL SPINE WO CONTRAST  Result Date: 11/29/2022 CLINICAL DATA:  Neck pain. EXAM: CT CERVICAL SPINE WITHOUT CONTRAST TECHNIQUE: Multidetector CT imaging of the cervical spine was  performed without intravenous contrast. Multiplanar CT image reconstructions were also generated. RADIATION DOSE REDUCTION: This exam was performed according to the departmental dose-optimization program which includes automated exposure control, adjustment of the mA and/or kV according to patient size and/or use of iterative reconstruction technique. COMPARISON:  MRI cervical spine 11/29/2022 FINDINGS: Alignment: Degenerative anterior subluxation of C7 compared to T1 of approximately 4 mm. There is also mild degenerative anterior subluxation of C4 compared to C5 maximum 2 mm. Skull base and vertebrae: No acute fracture. No primary bone lesion or focal pathologic process. Soft tissues and spinal canal: No prevertebral fluid or swelling. No visible canal hematoma. Disc levels:  C2-3: No significant findings. C3-4: Advanced degenerative disc disease with osteophytic ridging and uncinate spurring contributing to mild spinal and mild bilateral foraminal stenosis. C4-5: Bulging annulus, osteophytic ridging, uncinate spurring and facet disease contributing to mild spinal and moderate bilateral foraminal stenosis. C5-6: Severe degenerative disc disease with a bulging degenerated annulus, osteophytic ridging and uncinate spurring. No significant spinal stenosis. Mild to moderate bilateral foraminal stenosis. C6-7: Anterior and interbody fusion changes. No spinal or foraminal stenosis. C7-T1: Advanced degenerate disc disease and facet disease. Broad-based bulging uncovered disc, osteophytic ridging, uncinate spurring and facet disease contributing to mild spinal stenosis and moderate bilateral foraminal stenosis. Upper chest: The lung apices are grossly clear. Other: No neck mass or adenopathy. IMPRESSION: 1. Anterior and interbody fusion changes at [id number] without complicating features. 2. 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 3. Mild  spinal and moderate bilateral foraminal stenosis at C4-5. 4. Mild to moderate bilateral foraminal stenosis at C5-6. Electronically Signed   By: Marijo Sanes M.D.   On: 11/29/2022 14:35   MR CERVICAL SPINE WO CONTRAST  Result Date: 11/29/2022 CLINICAL DATA:  Chest pain, left arm pain, shoulder pain EXAM: MRI CERVICAL SPINE WITHOUT CONTRAST TECHNIQUE: Multiplanar, multisequence MR imaging of the cervical spine was performed. No intravenous contrast was administered. COMPARISON:  MRI cervical spine 06/09/2007, correlation is also made with CT cervical spine 12/29/2012 and CTA head neck 11/16/2022 FINDINGS: Alignment: 3 mm retrolisthesis of C3 on C4. 3 mm anterolisthesis of C4 on C5. 4 mm anterolisthesis of C7 on T1. Vertebrae: Heterogeneously increased  T2 signal is noted in the C5 vertebral body, which appears unchanged in contour and height compared to to 01/01/2023, favored to be degenerative. Status post ACDF C6-C7. Congenitally short pedicles, which narrow the AP diameter of the spinal canal. Cord: Small amount of increased T2 signal in the left aspect of the spinal cord at C7-T1 (series 8, image 38), which was not definitively present in 2008. Otherwise normal morphology and signal. Posterior Fossa, vertebral arteries, paraspinal tissues: Negative. Disc levels: C2-C3: No significant disc bulge. No spinal canal stenosis or neuroforaminal narrowing. C3-C4: Trace retrolisthesis. Facet and uncovertebral hypertrophy. Ligamentum flavum hypertrophy. Moderate spinal canal stenosis with moderate right and mild left neural foraminal narrowing, which have progressed from the prior exam. C4-C5: Trace anterolisthesis with disc unroofing. Facet and uncovertebral hypertrophy. Moderate spinal canal stenosis and moderate to severe bilateral neural foraminal narrowing, which have progressed from the prior exam. C5-C6: Minimal disc bulge. Facet and uncovertebral hypertrophy. Mild spinal canal stenosis and mild bilateral neural  foraminal narrowing, which appears similar to the prior exam. C6-C7: Status post ACDF. No spinal canal stenosis or neural foraminal narrowing. C7-T1: Anterolisthesis with disc unroofing. Ligamentum flavum hypertrophy. Facet and uncovertebral hypertrophy. Moderate to severe spinal canal stenosis and moderate bilateral neural foraminal narrowing, which have progressed since the prior exam. IMPRESSION: 1. 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 2. C4-C5 moderate spinal canal stenosis and moderate to severe bilateral neural foraminal narrowing. 3. C3-C4 moderate spinal canal stenosis with moderate right and mild left neural foraminal narrowing. 4. C5-C6 mild spinal canal stenosis and mild bilateral neural foraminal narrowing. Electronically Signed   By: Merilyn Baba M.D.   On: 11/29/2022 03:09   DG Shoulder Left  Result Date: 11/28/2022 CLINICAL DATA:  Left arm pain. EXAM: LEFT SHOULDER - 2+ VIEW COMPARISON:  None Available. FINDINGS: There is no evidence of an acute fracture or dislocation. Chronic changes are seen along the lateral aspect of the left acromion. Moderate severity degenerative changes seen involving the left acromioclavicular joint and left glenohumeral articulation. Soft tissues are unremarkable. Multiple sternal wires and vascular clips are seen. IMPRESSION: Moderate severity degenerative changes involving the left acromioclavicular joint and left glenohumeral articulation. Electronically Signed   By: Virgina Norfolk M.D.   On: 11/28/2022 23:09   CT HEAD WO CONTRAST (5MM)  Result Date: 11/28/2022 CLINICAL DATA:  Headache, increasing frequency or severity EXAM: CT HEAD WITHOUT CONTRAST TECHNIQUE: Contiguous axial images were obtained from the base of the skull  through the vertex without intravenous contrast. RADIATION DOSE REDUCTION: This exam was performed according to the departmental dose-optimization program which includes automated exposure control, adjustment of the mA and/or kV according to patient size and/or use of iterative reconstruction technique. COMPARISON:  11/16/2022 FINDINGS: Brain: There is periventricular white matter decreased attenuation consistent with small vessel ischemic changes. Gray-white differentiation is preserved. No acute intracranial hemorrhage, mass effect or shift. No hydrocephalus. Vascular: No hyperdense vessel or unexpected calcification. Skull: Normal. Negative for fracture or focal lesion. Sinuses/Orbits: No acute finding. IMPRESSION: Periventricular white matter changes consistent with chronic small vessel ischemia. No acute intracranial process identified. Electronically Signed   By: Sammie Bench M.D.   On: 11/28/2022 16:10   DG Chest 2 View  Result Date: 11/28/2022 CLINICAL DATA:  Chest pain. EXAM: CHEST - 2 VIEW  COMPARISON:  June 26, 2022. FINDINGS: The heart size and mediastinal contours are within normal limits. Status post coronary bypass graft. Minimal bibasilar subsegmental atelectasis is noted. The visualized skeletal structures are unremarkable. IMPRESSION: Minimal bibasilar subsegmental atelectasis. Electronically Signed   By: Marijo Conception M.D.   On: 11/28/2022 14:34    Cardiac Studies   Echo 11/16/2022: IMPRESSIONS   1. No apical thrombus with Definity contrast. Left ventricular ejection  fraction, by estimation, is 70 to 75%. Left ventricular ejection fraction  by PLAX is 74 %. The left ventricle has hyperdynamic function. The left  ventricle has no regional wall  motion abnormalities. There is mild left ventricular hypertrophy. Left  ventricular diastolic parameters are consistent with Grade I diastolic  dysfunction (impaired relaxation).   2. Right ventricular systolic function is mildly  reduced. The right  ventricular size is normal. There is mildly elevated pulmonary artery  systolic pressure. The estimated right ventricular systolic pressure is  99991111 mmHg.   3. The mitral valve is abnormal. Mild mitral valve regurgitation.   4. The aortic valve is tricuspid. Aortic valve regurgitation is not  visualized.   5. The inferior vena cava is normal in size with <50% respiratory  variability, suggesting right atrial pressure of 8 mmHg.    Patient Profile     77 y.o. male with CAD s/p CABG, recent CVA, HTN, HL, GERD, DM, CKD 3a, prior EtOH abuse, hematuria admitted with CP and L arm pain.    Assessment & Plan    # Chest pain: # CAD s/p CABG:: # Hyperlipidemia:  Referred pain from his severe cervical spine disease.  No further ischemic evaluation at this time.  Systolic function was normal on echo 11/16/2022.  His pain is atypical and not consistent with ischemic heart disease.  Cardiac enzymes continue to be within normal limits.  Lipids are well-controlled.  Continue aspirin, amlodipine, carvedilol, clopidogrel, Imdur, and rosuvastatin.  # Hypertension:  BP well-controlled on his current regimen.     Oliver will sign off.   Medication Recommendations:  none Other recommendations (labs, testing, etc):  none Follow up as an outpatient:  as scheduled   For questions or updates, please contact Welcome Please consult [email or web address] for contact info under        Signed, Skeet Latch, MD  11/30/2022, 9:41 AM
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
## 2022-12-30 ENCOUNTER — Encounter: Payer: Self-pay | Admitting: Cardiology

## 2022-12-30 ENCOUNTER — Ambulatory Visit: Payer: 59 | Attending: Cardiology | Admitting: Cardiology

## 2022-12-30 VITALS — BP 149/88 | HR 63 | Ht 67.0 in | Wt 183.0 lb

## 2022-12-30 DIAGNOSIS — G9589 Other specified diseases of spinal cord: Secondary | ICD-10-CM

## 2022-12-30 DIAGNOSIS — Z951 Presence of aortocoronary bypass graft: Secondary | ICD-10-CM

## 2022-12-30 DIAGNOSIS — I25118 Atherosclerotic heart disease of native coronary artery with other forms of angina pectoris: Secondary | ICD-10-CM

## 2022-12-30 DIAGNOSIS — I63211 Cerebral infarction due to unspecified occlusion or stenosis of right vertebral arteries: Secondary | ICD-10-CM

## 2022-12-30 DIAGNOSIS — I10 Essential (primary) hypertension: Secondary | ICD-10-CM

## 2022-12-30 DIAGNOSIS — E1169 Type 2 diabetes mellitus with other specified complication: Secondary | ICD-10-CM

## 2022-12-30 DIAGNOSIS — E785 Hyperlipidemia, unspecified: Secondary | ICD-10-CM

## 2022-12-30 NOTE — Patient Instructions (Signed)
Medication Instructions:  Continue same medications *If you need a refill on your cardiac medications before your next appointment, please call your pharmacy*   Lab Work: None ordered   Testing/Procedures: None ordered   Follow-Up: At [facility], you and your health needs are our priority.  As part of our continuing mission to provide you with exceptional heart care, we have created designated Provider Care Teams.  These Care Teams include your primary Cardiologist (physician) and Advanced Practice Providers (APPs -  Physician Assistants and Nurse Practitioners) who all work together to provide you with the care you need, when you need it.  We recommend signing up for the patient portal called "MyChart".  Sign up information is provided on this After Visit Summary.  MyChart is used to connect with patients for Virtual Visits (Telemedicine).  Patients are able to view lab/test results, encounter notes, upcoming appointments, etc.  Non-urgent messages can be sent to your provider as well.   To learn more about what you can do with MyChart, go to [email or web address].    Your next appointment:  6 months    Provider:  Dr.Jordan

## 2022-12-31 ENCOUNTER — Telehealth: Payer: Self-pay

## 2022-12-31 NOTE — Telephone Encounter (Signed)
Spoke to patient appointment scheduled with Dr.Dawley at [facility] Neuro Surgery 01/06/23 at 3:40 pm.

## 2023-01-15 ENCOUNTER — Encounter: Payer: Self-pay | Admitting: Neurology

## 2023-01-15 ENCOUNTER — Ambulatory Visit (INDEPENDENT_AMBULATORY_CARE_PROVIDER_SITE_OTHER): Payer: 59 | Admitting: Neurology

## 2023-01-15 ENCOUNTER — Telehealth: Payer: Self-pay | Admitting: Neurology

## 2023-01-15 VITALS — BP 152/82 | HR 66 | Ht 67.5 in | Wt 180.0 lb

## 2023-01-15 DIAGNOSIS — E119 Type 2 diabetes mellitus without complications: Secondary | ICD-10-CM

## 2023-01-15 DIAGNOSIS — I6501 Occlusion and stenosis of right vertebral artery: Secondary | ICD-10-CM | POA: Diagnosis not present

## 2023-01-15 DIAGNOSIS — I639 Cerebral infarction, unspecified: Secondary | ICD-10-CM

## 2023-01-15 MED ORDER — CLOPIDOGREL BISULFATE 75 MG PO TABS: 75.0000 mg | ORAL_TABLET | Freq: Every day | ORAL | 0 refills | Status: DC
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
## 2023-01-15 NOTE — Patient Instructions (Signed)
I had a long d/w patient about his recent cerebellar stroke, right vertebral artery occlusion,risk for recurrent stroke/TIAs, personally independently reviewed imaging studies and stroke evaluation results and answered questions.Continue aspirin 81 mg daily and clopidogrel 75 mg daily for 1 more month and then aspirin alone for secondary stroke prevention and maintain strict control of hypertension with blood pressure goal below 130/90, diabetes with hemoglobin A1c goal below 6.5% and lipids with LDL cholesterol goal below 70 mg/dL. I also advised the patient to eat a healthy diet with plenty of whole grains, cereals, fruits and vegetables, exercise regularly and maintain ideal body weight check follow-up CT angiogram of the brain and neck to look for right vertebral artery recanalization.  I encouraged him to continue ongoing physical therapy to improve gait and balance.  Followup in the future with my nurse practitioner in 4 months or call earlier if necessary.  Stroke Prevention Some medical conditions and behaviors can lead to a higher chance of having a stroke. You can help prevent a stroke by eating healthy, exercising, not smoking, and managing any medical conditions you have. Stroke is a leading cause of functional impairment. Primary prevention is particularly important because a majority of strokes are first-time events. Stroke changes the lives of not only those who experience a stroke but also their family and other caregivers. How can this condition affect me? A stroke is a medical emergency and should be treated right away. A stroke can lead to brain damage and can sometimes be life-threatening. If a person gets medical treatment right away, there is a better chance of surviving and recovering from a stroke. What can increase my risk? The following medical conditions may increase your risk of a stroke: Cardiovascular disease. High blood pressure (hypertension). Diabetes. High  cholesterol. Sickle cell disease. Blood clotting disorders (hypercoagulable state). Obesity. Sleep disorders (obstructive sleep apnea). Other risk factors include: Being older than age 9. Having a history of blood clots, stroke, or mini-stroke (transient ischemic attack, TIA). Genetic factors, such as race, ethnicity, or a family history of stroke. Smoking cigarettes or using other tobacco products. Taking birth control pills, especially if you also use tobacco. Heavy use of alcohol or drugs, especially cocaine and methamphetamine. Physical inactivity. What actions can I take to prevent this? Manage your health conditions High cholesterol levels. Eating a healthy diet is important for preventing high cholesterol. If cholesterol cannot be managed through diet alone, you may need to take medicines. Take any prescribed medicines to control your cholesterol as told by your health care provider. Hypertension. To reduce your risk of stroke, try to keep your blood pressure below 130/80. Eating a healthy diet and exercising regularly are important for controlling blood pressure. If these steps are not enough to manage your blood pressure, you may need to take medicines. Take any prescribed medicines to control hypertension as told by your health care provider. Ask your health care provider if you should monitor your blood pressure at home. Have your blood pressure checked every year, even if your blood pressure is normal. Blood pressure increases with age and some medical conditions. Diabetes. Eating a healthy diet and exercising regularly are important parts of managing your blood sugar (glucose). If your blood sugar cannot be managed through diet and exercise, you may need to take medicines. Take any prescribed medicines to control your diabetes as told by your health care provider. Get evaluated for obstructive sleep apnea. Talk to your health care provider about getting a sleep evaluation if  you snore a lot or have excessive sleepiness. Make sure that any other medical conditions you have, such as atrial fibrillation or atherosclerosis, are managed. Nutrition Follow instructions from your health care provider about what to eat or drink to help manage your health condition. These instructions may include: Reducing your daily calorie intake. Limiting how much salt (sodium) you use to 1,500 milligrams (mg) each day. Using only healthy fats for cooking, such as olive oil, canola oil, or sunflower oil. Eating healthy foods. You can do this by: Choosing foods that are high in fiber, such as whole grains, and fresh fruits and vegetables. Eating at least 5 servings of fruits and vegetables a day. Try to fill one-half of your plate with fruits and vegetables at each meal. Choosing lean protein foods, such as lean cuts of meat, poultry without skin, fish, tofu, beans, and nuts. Eating low-fat dairy products. Avoiding foods that are high in sodium. This can help lower blood pressure. Avoiding foods that have saturated fat, trans fat, and cholesterol. This can help prevent high cholesterol. Avoiding processed and prepared foods. Counting your daily carbohydrate intake.  Lifestyle If you drink alcohol: Limit how much you have to: 0-1 drink a day for women who are not pregnant. 0-2 drinks a day for men. Know how much alcohol is in your drink. In the [place] one drink equals one 12 oz bottle of beer ( ), one 5 oz glass of wine ( ), or one 1 oz glass of hard liquor (27mL). Do not use any products that contain nicotine or tobacco. These products include cigarettes, chewing tobacco, and vaping devices, such as e-cigarettes. If you need help quitting, ask your health care provider. Avoid secondhand smoke. Do not use drugs. Activity  Try to stay at a healthy weight. Get at least 30 minutes of exercise on most days, such as: Fast walking. Biking. Swimming. Medicines Take  over-the-counter and prescription medicines only as told by your health care provider. Aspirin or blood thinners (antiplatelets or anticoagulants) may be recommended to reduce your risk of forming blood clots that can lead to stroke. Avoid taking birth control pills. Talk to your health care provider about the risks of taking birth control pills if: You are over 1 years old. You smoke. You get very bad headaches. You have had a blood clot. Where to find more information American Stroke Association: www.strokeassociation.org Get help right away if: You or a loved one has any symptoms of a stroke. "BE FAST" is an easy way to remember the main warning signs of a stroke: B - Balance. Signs are dizziness, sudden trouble walking, or loss of balance. E - Eyes. Signs are trouble seeing or a sudden change in vision. F - Face. Signs are sudden weakness or numbness of the face, or the face or eyelid drooping on one side. A - Arms. Signs are weakness or numbness in an arm. This happens suddenly and usually on one side of the body. S - Speech. Signs are sudden trouble speaking, slurred speech, or trouble understanding what people say. T - Time. Time to call emergency services. Write down what time symptoms started. You or a loved one has other signs of a stroke, such as: A sudden, severe headache with no known cause. Nausea or vomiting. Seizure. These symptoms may represent a serious problem that is an emergency. Do not wait to see if the symptoms will go away. Get medical help right away. Call your local emergency services (911 in the [place] Do not  drive yourself to the hospital. Summary You can help to prevent a stroke by eating healthy, exercising, not smoking, limiting alcohol intake, and managing any medical conditions you may have. Do not use any products that contain nicotine or tobacco. These include cigarettes, chewing tobacco, and vaping devices, such as e-cigarettes. If you need help quitting,  ask your health care provider. Remember "BE FAST" for warning signs of a stroke. Get help right away if you or a loved one has any of these signs. This information is not intended to replace advice given to you by your health care provider. Make sure you discuss any questions you have with your health care provider. Document Revised: 04/13/2020 Document Reviewed: 05/01/2020 Elsevier Patient Education  [place].

## 2023-01-15 NOTE — Telephone Encounter (Signed)
[employer] medicare NPR sent to GI [phone] 

## 2023-02-14 ENCOUNTER — Ambulatory Visit
Admission: RE | Admit: 2023-02-14 | Discharge: 2023-02-14 | Disposition: A | Discharge status: Home / Self Care | Payer: 59 | Source: Ambulatory Visit | Attending: Neurology | Admitting: Neurology

## 2023-02-14 MED ORDER — IOPAMIDOL (ISOVUE-370) INJECTION 76%
75.0000 mL | Freq: Once | INTRAVENOUS | Status: AC | PRN
  Administered 2023-02-14: 75 mL via INTRAVENOUS

## 2023-02-27 ENCOUNTER — Other Ambulatory Visit: Payer: Self-pay

## 2023-02-27 NOTE — Patient Outreach (Signed)
Patient's spouse called back, obtained mRS successfully. MRS= 1  Vanice Sarah [facility] Care Management Assistant [phone]

## 2023-02-27 NOTE — Patient Outreach (Signed)
First telephone outreach attempt to obtain mRS. No answer. Left message for returned call.  Pansey Pinheiro [facility]-Care Management Assistant [phone]  

## 2023-02-28 NOTE — Progress Notes (Signed)
Kindly inform the patient that CT angiogram study of the head and neck blood vessels shows persistent occlusion of the right vertebral artery in the neck with some increased attempt at collateral flow beyond the blockage.  No significant changes.  Nothing to worry about

## 2023-03-03 ENCOUNTER — Encounter: Payer: Self-pay | Admitting: Neurology

## 2023-03-03 ENCOUNTER — Telehealth: Payer: Self-pay | Admitting: Neurology

## 2023-03-03 NOTE — Telephone Encounter (Signed)
Attempted to call the patient and the number states not in service. I will forward a mychart message.

## 2023-03-03 NOTE — Telephone Encounter (Signed)
-----   Message from Deatra James, RN sent at 03/03/2023  7:41 AM EDT -----  ----- Message ----- From: Micki Riley, MD Sent: 02/28/2023   9:56 AM EDT To: Gna-Pod 2 Results  Kindly inform the patient that CT angiogram study of the head and neck blood vessels shows persistent occlusion of the right vertebral artery in the neck with some increased attempt at collateral flow beyond the blockage.  No significant changes.  Nothing to worry about

## 2023-05-30 ENCOUNTER — Other Ambulatory Visit: Payer: Self-pay | Admitting: General Practice

## 2023-06-09 ENCOUNTER — Other Ambulatory Visit: Payer: Self-pay | Admitting: Cardiology

## 2023-06-11 ENCOUNTER — Ambulatory Visit (INDEPENDENT_AMBULATORY_CARE_PROVIDER_SITE_OTHER): Payer: 59 | Admitting: Adult Health

## 2023-06-11 ENCOUNTER — Telehealth: Payer: Self-pay | Admitting: Adult Health

## 2023-06-11 ENCOUNTER — Encounter: Payer: Self-pay | Admitting: Adult Health

## 2023-06-11 VITALS — BP 132/78 | HR 74 | Ht 67.0 in | Wt 185.8 lb

## 2023-06-11 DIAGNOSIS — R42 Dizziness and giddiness: Secondary | ICD-10-CM | POA: Diagnosis not present

## 2023-06-11 DIAGNOSIS — I6501 Occlusion and stenosis of right vertebral artery: Secondary | ICD-10-CM | POA: Diagnosis not present

## 2023-06-11 DIAGNOSIS — I639 Cerebral infarction, unspecified: Secondary | ICD-10-CM | POA: Diagnosis not present

## 2023-06-11 DIAGNOSIS — R519 Headache, unspecified: Secondary | ICD-10-CM | POA: Diagnosis not present

## 2023-06-11 MED ORDER — TOPIRAMATE 25 MG PO TABS: 25.0000 mg | ORAL_TABLET | Freq: Two times a day (BID) | ORAL | 5 refills | Status: DC

## 2023-06-11 NOTE — Telephone Encounter (Signed)
[employer] medicare NPR sent to GI [phone] 
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
## 2023-06-11 NOTE — Patient Instructions (Addendum)
Recommend starting topamax 25mg  twice daily for headaches - please let me know after a couple weeks if dosage needs to be increased   Please stop use of Goody powders as this can cause worsening headaches as well as cause increased risk of brain bleed especially in addition to aspirin  You will be called to schedule repeat head and neck vessel imaging due to worsening dizziness and headaches  Continue aspirin 81 mg daily and Crestor for secondary stroke prevention  Continue to follow up with PCP regarding blood pressure, cholesterol and diabetes management  Maintain strict control of hypertension with blood pressure goal below 130/90, diabetes with hemoglobin A1c goal below 7.0 % and cholesterol with LDL cholesterol (bad cholesterol) goal below 70 mg/dL.   Signs of a Stroke? Follow the BEFAST method:  Balance Watch for a sudden loss of balance, trouble with coordination or vertigo Eyes Is there a sudden loss of vision in one or both eyes? Or double vision?  Face: Ask the person to smile. Does one side of the face droop or is it numb?  Arms: Ask the person to raise both arms. Does one arm drift downward? Is there weakness or numbness of a leg? Speech: Ask the person to repeat a simple phrase. Does the speech sound slurred/strange? Is the person confused ? Time: If you observe any of these signs, call 911.      Followup in the future with me in 6 months or call earlier if needed      Thank you for coming to see [place] at [facility]. I hope we have been able to provide you high quality care today.  You may receive a patient satisfaction survey over the next few weeks. We would appreciate your feedback and comments so that we may continue to improve ourselves and the health of our patients.

## 2023-06-16 NOTE — Progress Notes (Signed)
I agree with the above plan 

## 2023-06-18 ENCOUNTER — Ambulatory Visit
Admission: RE | Admit: 2023-06-18 | Discharge: 2023-06-18 | Disposition: A | Discharge status: Home / Self Care | Payer: 59 | Source: Ambulatory Visit | Attending: Adult Health | Admitting: Adult Health

## 2023-06-18 DIAGNOSIS — R42 Dizziness and giddiness: Secondary | ICD-10-CM

## 2023-06-18 DIAGNOSIS — I6501 Occlusion and stenosis of right vertebral artery: Secondary | ICD-10-CM

## 2023-06-18 DIAGNOSIS — I639 Cerebral infarction, unspecified: Secondary | ICD-10-CM

## 2023-06-18 DIAGNOSIS — R519 Headache, unspecified: Secondary | ICD-10-CM

## 2023-06-18 MED ORDER — IOPAMIDOL (ISOVUE-370) INJECTION 76%
75.0000 mL | Freq: Once | INTRAVENOUS | Status: AC | PRN
  Administered 2023-06-18: 75 mL via INTRAVENOUS
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
## 2023-06-24 ENCOUNTER — Ambulatory Visit: Payer: 59 | Attending: Cardiology | Admitting: Cardiology

## 2023-06-24 ENCOUNTER — Encounter: Payer: Self-pay | Admitting: Cardiology

## 2023-06-24 VITALS — BP 128/86 | HR 75 | Ht 67.5 in | Wt 181.8 lb

## 2023-06-24 DIAGNOSIS — I25118 Atherosclerotic heart disease of native coronary artery with other forms of angina pectoris: Secondary | ICD-10-CM

## 2023-06-24 DIAGNOSIS — E1169 Type 2 diabetes mellitus with other specified complication: Secondary | ICD-10-CM

## 2023-06-24 DIAGNOSIS — Z951 Presence of aortocoronary bypass graft: Secondary | ICD-10-CM | POA: Diagnosis not present

## 2023-06-24 DIAGNOSIS — I10 Essential (primary) hypertension: Secondary | ICD-10-CM

## 2023-06-24 DIAGNOSIS — E785 Hyperlipidemia, unspecified: Secondary | ICD-10-CM

## 2023-06-24 DIAGNOSIS — I63211 Cerebral infarction due to unspecified occlusion or stenosis of right vertebral arteries: Secondary | ICD-10-CM

## 2023-06-24 NOTE — Patient Instructions (Signed)
Medication Instructions:  Continue same medications *If you need a refill on your cardiac medications before your next appointment, please call your pharmacy*   Lab Work: None ordered   Testing/Procedures: None ordered   Follow-Up: At [facility], you and your health needs are our priority.  As part of our continuing mission to provide you with exceptional heart care, we have created designated Provider Care Teams.  These Care Teams include your primary Cardiologist (physician) and Advanced Practice Providers (APPs -  Physician Assistants and Nurse Practitioners) who all work together to provide you with the care you need, when you need it.  We recommend signing up for the patient portal called "MyChart".  Sign up information is provided on this After Visit Summary.  MyChart is used to connect with patients for Virtual Visits (Telemedicine).  Patients are able to view lab/test results, encounter notes, upcoming appointments, etc.  Non-urgent messages can be sent to your provider as well.   To learn more about what you can do with MyChart, go to [email or web address].    Your next appointment:  6 months    Call in Jan to schedule March appointment     Provider:  Dr.Jordan

## 2023-09-10 ENCOUNTER — Other Ambulatory Visit (HOSPITAL_BASED_OUTPATIENT_CLINIC_OR_DEPARTMENT_OTHER): Payer: Self-pay | Admitting: Cardiology

## 2024-01-01 ENCOUNTER — Other Ambulatory Visit: Payer: Self-pay | Admitting: Adult Health

## 2024-01-01 DIAGNOSIS — R519 Headache, unspecified: Secondary | ICD-10-CM

## 2024-01-01 NOTE — Telephone Encounter (Signed)
 Last seen on 06/11/23 "Follow-up in 6 months or call earlier if needed" No follow up scheduled
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
## 2024-02-27 ENCOUNTER — Ambulatory Visit: Payer: 59 | Attending: Cardiology | Admitting: Cardiology

## 2024-03-01 ENCOUNTER — Encounter: Payer: Self-pay | Admitting: Cardiology

## 2024-07-09 ENCOUNTER — Other Ambulatory Visit (HOSPITAL_BASED_OUTPATIENT_CLINIC_OR_DEPARTMENT_OTHER): Payer: Self-pay | Admitting: Cardiology
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
## 2024-10-20 ENCOUNTER — Ambulatory Visit: Admitting: Cardiology

## 2024-11-23 ENCOUNTER — Ambulatory Visit: Admitting: Cardiology
